# Patient Record
Sex: Female | Born: 1985 | Race: Black or African American | Hispanic: No | Marital: Single | Smoking: Former smoker
Health system: Southern US, Community
[De-identification: ages and names within clinical notes are randomized; demographics above are authoritative.]

## PROBLEM LIST (undated history)

## (undated) ENCOUNTER — Inpatient Hospital Stay (HOSPITAL_COMMUNITY): Payer: Self-pay

## (undated) DIAGNOSIS — M359 Systemic involvement of connective tissue, unspecified: Secondary | ICD-10-CM

## (undated) DIAGNOSIS — R51 Headache: Secondary | ICD-10-CM

## (undated) DIAGNOSIS — E669 Obesity, unspecified: Secondary | ICD-10-CM

## (undated) DIAGNOSIS — N200 Calculus of kidney: Secondary | ICD-10-CM

## (undated) DIAGNOSIS — C9301 Acute monoblastic/monocytic leukemia, in remission: Secondary | ICD-10-CM

## (undated) DIAGNOSIS — K760 Fatty (change of) liver, not elsewhere classified: Secondary | ICD-10-CM

## (undated) DIAGNOSIS — I10 Essential (primary) hypertension: Secondary | ICD-10-CM

## (undated) DIAGNOSIS — T827XXA Infection and inflammatory reaction due to other cardiac and vascular devices, implants and grafts, initial encounter: Secondary | ICD-10-CM

## (undated) DIAGNOSIS — R7881 Bacteremia: Secondary | ICD-10-CM

## (undated) HISTORY — DX: Calculus of kidney: N20.0

## (undated) HISTORY — PX: OTHER SURGICAL HISTORY: SHX169

## (undated) HISTORY — PX: PORTACATH PLACEMENT: SHX2246

---
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
## 2007-01-17 ENCOUNTER — Emergency Department (HOSPITAL_COMMUNITY): Admission: EM | Admit: 2007-01-17 | Discharge: 2007-01-17 | Payer: Self-pay | Admitting: Family Medicine

## 2007-02-18 ENCOUNTER — Emergency Department (HOSPITAL_COMMUNITY): Admission: EM | Admit: 2007-02-18 | Discharge: 2007-02-19 | Payer: Self-pay | Admitting: Emergency Medicine

## 2007-03-03 ENCOUNTER — Ambulatory Visit: Payer: Self-pay | Admitting: Sports Medicine

## 2007-05-20 ENCOUNTER — Telehealth: Payer: Self-pay | Admitting: *Deleted

## 2007-05-20 ENCOUNTER — Ambulatory Visit: Payer: Self-pay | Admitting: Family Medicine

## 2007-05-20 DIAGNOSIS — L2089 Other atopic dermatitis: Secondary | ICD-10-CM

## 2007-05-27 ENCOUNTER — Ambulatory Visit: Payer: Self-pay | Admitting: Family Medicine

## 2007-05-27 ENCOUNTER — Encounter (INDEPENDENT_AMBULATORY_CARE_PROVIDER_SITE_OTHER): Payer: Self-pay | Admitting: *Deleted

## 2007-05-27 ENCOUNTER — Encounter (INDEPENDENT_AMBULATORY_CARE_PROVIDER_SITE_OTHER): Payer: Self-pay | Admitting: Family Medicine

## 2007-05-27 LAB — CONVERTED CEMR LAB: Hgb A1c MFr Bld: 7.4 %

## 2007-05-30 LAB — CONVERTED CEMR LAB
ALT: 28 units/L (ref 0–35)
AST: 20 units/L (ref 0–37)
Albumin: 4.7 g/dL (ref 3.5–5.2)
Alkaline Phosphatase: 69 units/L (ref 39–117)
BUN: 19 mg/dL (ref 6–23)
Calcium: 9.5 mg/dL (ref 8.4–10.5)
Chloride: 105 meq/L (ref 96–112)
HDL: 39 mg/dL (ref >34)
LDL Cholesterol: 49 mg/dL (ref 0–109)
Potassium: 4.5 meq/L (ref 3.5–5.3)
Sodium: 141 meq/L (ref 135–145)
Total Protein: 7 g/dL (ref 6.0–8.3)

## 2007-06-01 ENCOUNTER — Telehealth: Payer: Self-pay | Admitting: *Deleted

## 2007-11-02 ENCOUNTER — Encounter: Payer: Self-pay | Admitting: Family Medicine

## 2007-12-14 ENCOUNTER — Emergency Department (HOSPITAL_COMMUNITY): Admission: EM | Admit: 2007-12-14 | Discharge: 2007-12-14 | Payer: Self-pay | Admitting: Emergency Medicine

## 2007-12-15 ENCOUNTER — Emergency Department (HOSPITAL_COMMUNITY): Admission: EM | Admit: 2007-12-15 | Discharge: 2007-12-16 | Payer: Self-pay | Admitting: Emergency Medicine

## 2008-10-30 ENCOUNTER — Emergency Department (HOSPITAL_COMMUNITY): Admission: EM | Admit: 2008-10-30 | Discharge: 2008-10-30 | Payer: Self-pay | Admitting: Emergency Medicine

## 2008-11-03 ENCOUNTER — Emergency Department (HOSPITAL_COMMUNITY): Admission: EM | Admit: 2008-11-03 | Discharge: 2008-11-03 | Payer: Self-pay | Admitting: Emergency Medicine

## 2009-01-28 ENCOUNTER — Telehealth: Payer: Self-pay | Admitting: Family Medicine

## 2009-01-29 ENCOUNTER — Ambulatory Visit: Payer: Self-pay | Admitting: Family Medicine

## 2009-01-29 ENCOUNTER — Encounter (INDEPENDENT_AMBULATORY_CARE_PROVIDER_SITE_OTHER): Payer: Self-pay | Admitting: Family Medicine

## 2009-01-29 LAB — CONVERTED CEMR LAB
Bilirubin Urine: NEGATIVE
Epithelial cells, urine: >20 /lpf
Glucose, Urine, Semiquant: 500
Protein, U semiquant: 100
Specific Gravity, Urine: 1.015
WBC, UA: >20 cells/hpf
Whiff Test: NEGATIVE
pH: 5.5

## 2009-01-30 LAB — CONVERTED CEMR LAB

## 2009-02-07 ENCOUNTER — Emergency Department (HOSPITAL_COMMUNITY): Admission: EM | Admit: 2009-02-07 | Discharge: 2009-02-07 | Payer: Self-pay | Admitting: Emergency Medicine

## 2009-02-08 ENCOUNTER — Emergency Department (HOSPITAL_COMMUNITY): Admission: EM | Admit: 2009-02-08 | Discharge: 2009-02-09 | Payer: Self-pay | Admitting: Emergency Medicine

## 2009-02-10 ENCOUNTER — Emergency Department (HOSPITAL_COMMUNITY): Admission: EM | Admit: 2009-02-10 | Discharge: 2009-02-10 | Payer: Self-pay | Admitting: Emergency Medicine

## 2009-02-12 ENCOUNTER — Telehealth: Payer: Self-pay | Admitting: *Deleted

## 2009-02-14 ENCOUNTER — Ambulatory Visit: Payer: Self-pay | Admitting: Family Medicine

## 2009-02-14 ENCOUNTER — Encounter: Payer: Self-pay | Admitting: Family Medicine

## 2009-02-14 LAB — CONVERTED CEMR LAB
BUN: 10 mg/dL (ref 6–23)
Chloride: 102 meq/L (ref 96–112)
Hgb A1c MFr Bld: 10.7 %
Potassium: 4.2 meq/L (ref 3.5–5.3)

## 2009-05-09 ENCOUNTER — Emergency Department (HOSPITAL_COMMUNITY): Admission: EM | Admit: 2009-05-09 | Discharge: 2009-05-09 | Payer: Self-pay | Admitting: Emergency Medicine

## 2009-06-14 ENCOUNTER — Ambulatory Visit: Payer: Self-pay | Admitting: Family Medicine

## 2009-06-14 ENCOUNTER — Encounter: Payer: Self-pay | Admitting: Family Medicine

## 2009-06-14 LAB — CONVERTED CEMR LAB: Beta hcg, urine, semiquantitative: NEGATIVE

## 2009-11-22 ENCOUNTER — Ambulatory Visit: Payer: Self-pay | Admitting: Family Medicine

## 2009-11-22 LAB — CONVERTED CEMR LAB: Hgb A1c MFr Bld: 6.4 %

## 2009-11-25 ENCOUNTER — Ambulatory Visit: Payer: Self-pay | Admitting: Family Medicine

## 2009-11-25 ENCOUNTER — Encounter: Payer: Self-pay | Admitting: *Deleted

## 2009-11-25 ENCOUNTER — Encounter: Payer: Self-pay | Admitting: Family Medicine

## 2009-11-25 LAB — CONVERTED CEMR LAB
CO2: 22 meq/L (ref 19–32)
Calcium: 9.1 mg/dL (ref 8.4–10.5)
Chloride: 109 meq/L (ref 96–112)
Creatinine, Ser: 0.66 mg/dL (ref 0.40–1.20)
HDL: 32 mg/dL — ABNORMAL LOW (ref >39)
LDL Cholesterol: 20 mg/dL (ref 0–99)
Sodium: 142 meq/L (ref 135–145)
Total CHOL/HDL Ratio: 3.2
Triglycerides: 249 mg/dL — ABNORMAL HIGH (ref <150)

## 2009-12-04 ENCOUNTER — Telehealth (INDEPENDENT_AMBULATORY_CARE_PROVIDER_SITE_OTHER): Payer: Self-pay | Admitting: Family Medicine

## 2010-01-15 ENCOUNTER — Ambulatory Visit: Payer: Self-pay | Admitting: Family Medicine

## 2010-01-15 ENCOUNTER — Encounter: Payer: Self-pay | Admitting: *Deleted

## 2010-01-15 ENCOUNTER — Encounter: Payer: Self-pay | Admitting: Family Medicine

## 2010-01-29 ENCOUNTER — Encounter: Payer: Self-pay | Admitting: Family Medicine

## 2010-02-02 ENCOUNTER — Emergency Department (HOSPITAL_COMMUNITY): Admission: EM | Admit: 2010-02-02 | Discharge: 2010-02-02 | Payer: Self-pay | Admitting: Emergency Medicine

## 2010-02-25 ENCOUNTER — Ambulatory Visit: Payer: Self-pay | Admitting: Family Medicine

## 2010-02-27 ENCOUNTER — Ambulatory Visit: Payer: Self-pay | Admitting: Family Medicine

## 2010-03-07 ENCOUNTER — Encounter: Payer: Self-pay | Admitting: Family Medicine

## 2010-03-28 ENCOUNTER — Emergency Department (HOSPITAL_COMMUNITY): Admission: EM | Admit: 2010-03-28 | Discharge: 2010-03-28 | Payer: Self-pay | Admitting: Emergency Medicine

## 2010-04-03 ENCOUNTER — Telehealth: Payer: Self-pay | Admitting: Family Medicine

## 2010-04-03 ENCOUNTER — Ambulatory Visit: Payer: Self-pay | Admitting: Family Medicine

## 2010-04-03 DIAGNOSIS — L0291 Cutaneous abscess, unspecified: Secondary | ICD-10-CM

## 2010-04-03 DIAGNOSIS — L0390 Cellulitis, unspecified: Secondary | ICD-10-CM

## 2010-06-17 ENCOUNTER — Telehealth: Payer: Self-pay | Admitting: Family Medicine

## 2010-06-17 ENCOUNTER — Emergency Department (HOSPITAL_COMMUNITY): Admission: EM | Admit: 2010-06-17 | Discharge: 2010-06-17 | Payer: Self-pay | Admitting: Emergency Medicine

## 2010-07-28 ENCOUNTER — Emergency Department (HOSPITAL_COMMUNITY)
Admission: EM | Admit: 2010-07-28 | Discharge: 2010-07-28 | Payer: Self-pay | Source: Home / Self Care | Admitting: Emergency Medicine

## 2010-10-21 NOTE — Progress Notes (Signed)
Summary: wi request  Phone Note Call from Patient Call back at Home Phone [phone]   Reason for Call: Talk to Nurse Summary of Call: req wi appt for abcess Initial call taken by: Knox Royalty,  April 03, 2010 9:48 AM  Follow-up for Phone Call        on R buttocks x 3-4 days. went to ED monday & they could not lance it "because it did not have a soft spot". states it does now. workin today with Dr. Denyse Amass. pcp not available Follow-up by: Golden Circle RN,  April 03, 2010 9:52 AM

## 2010-10-21 NOTE — Assessment & Plan Note (Signed)
Summary: had gone to [facility]   Vital Signs:  Patient profile:   25 year old female Weight:      270 pounds Temp:     100.2 degrees F oral Pulse rate:   91 / minute Pulse rhythm:   regular BP sitting:   119 / 85  (left arm) Cuff size:   large  Vitals Entered By: Loralee Pacas CMA (January 15, 2010 2:59 PM) CC: boil Comments boil on right buttock ecezma on right arm   Primary Care Provider:  Lequita Asal  MD  CC:  boil.  History of Present Illness: lesion on R buttock. noted last night. no drainage. +TTP. no fever, chills, n/v. no prior history.   Allergies: 1)  ! Hydrocodone  Procedure Note  Incision & Drainage: The patient complains of pain, redness, irritation, inflammation, tenderness, and fever but denies discharge. Date of onset: 01/14/2010 Onset of lesion: 12-24 hrs. Indication: infected lesion Consent signed: yes  Procedure # 1: I & D    Size (in cm): 3.0 x 3.0    Location: buttocks-right    Comment: area cleaned and prepped in sterile fashion and anesthetized with lidocaine with epi. incision made with scalpel, but no drainage appreciated. area probed with hemostat but no loculations appreciated. minimal bleeding. patient tolerated procedure well.     Instrument used: #11 blade    Anesthesia: 1% lidocaine w/epinephrine  Physical Exam  General:  obese female, NAD. vitals reviewed.  Skin:   ~3x3 cm area of induration with ?central fluctuance of right buttock. +warmth, TTP, erythema.    Impression & Recommendations:  Problem # 1:  CELLULITIS AND ABSCESS OF BUTTOCK (ICD-682.5) Assessment New  I&D attempted, but no drainage expressed. given findings, low grade fever, and evidence of cellulitis will treat with antibiotics, warm compresses.   Her updated medication list for this problem includes:    Keflex 500 Mg Caps (Cephalexin) ..... One tab by mouth two times a day x7 days.  Orders: [facility]- Est  Level 4 (62130) I&D Abcess, simple- [facility]  (10060) Prescriptions: KEFLEX 500 MG CAPS (CEPHALEXIN) one tab by mouth two times a day x7 days.  #14 x 0   Entered and Authorized by:   Lequita Asal  MD   Signed by:   Lequita Asal  MD on 01/15/2010   Method used:   Electronically to        [facility] [phone]* (retail)       [address]       [place], [place]  [address]       Ph: [phone]       Fax: [phone]   RxID:   [id number]

## 2010-10-21 NOTE — Assessment & Plan Note (Signed)
Summary: f/u dm & tb test,df   Vital Signs:  Patient profile:   25 year old female Weight:      272.5 pounds BMI:     48.45 Temp:     98.7 degrees F oral Pulse rate:   58 / minute Pulse rhythm:   regular BP sitting:   141 / 88  (right arm)  Vitals Entered By: Loralee Pacas CMA (November 22, 2009 9:24 AM)  Nutrition Counseling: Patient's BMI is greater than 25 and therefore counseled on weight management options.  Primary Care Provider:  Lequita Asal  MD  CC:  f/u DM and HTN.  History of Present Illness: 25 y/o female here for f/u DM, HTN  DM- off meds x6 months. endorses polyuria, polydipsia, occasional blurred vision. weight gain of 13 lbs since last visit. little exercise. not testing CBGs  HTN- off meds x6 months. denies chest pain, SOB, headaches. occasional blurred vision  "mass" on chest- present 1 year. occasionally tender. not growing larger. no drainage. concerned 2/2 extensive family hx of various cancers.   Current Medications (verified): 1)  Metformin Hcl 850 Mg Tabs (Metformin Hcl) .... One Tab By Mouth Bid 2)  Lisinopril 20 Mg Tabs (Lisinopril) .Marland Kitchen.. 1 Tablet By Mouth Daily 3)  Triamcinolone Acetonide 0.1 % Oint (Triamcinolone Acetonide) .... Apply To Affected Areas Two Times A Day As Needed. Do Not Use On Face. Dispense Quantity Sufficient For 1 Month 4)  Prodigy Blood Glucose Monitor W/device Kit (Blood Glucose Monitoring Suppl) .... Use As Directed 5)  Prodigy Twist Top Lancets 28g  Misc (Lancets) .... Use As Directed. Disp 1 Month Supply 6)  Prodigy Blood Glucose Test  Strp (Glucose Blood) .... Use As Directed. Disp 1 Month Supply  Allergies (verified): 1)  ! Hydrocodone  Physical Exam  General:  obese hirsute female, NAD, vitals reviewed.  Mouth:  MMM Neck:  acanthosis nigricans Chest Wall:  small 5x5 mm cyst R of midline. nontender. no fluctuance, erythema, drainage, induration. Lungs:  Normal respiratory effort, chest expands symmetrically. Lungs  are clear to auscultation, no crackles or wheezes. Heart:  Normal rate and regular rhythm. S1 and S2 normal without gallop, murmur, click, rub or other extra sounds. Abdomen:  obese, NT, ND, +BS Extremities:  no peripheral edema of BLE  Diabetes Management Exam:    Foot Exam (with socks and/or shoes not present):       Sensory-Pinprick/Light touch:          Left medial foot (L-4): normal          Left dorsal foot (L-5): normal          Left lateral foot (S-1): normal          Right medial foot (L-4): normal          Right dorsal foot (L-5): normal          Right lateral foot (S-1): normal       Sensory-Monofilament:          Left foot: normal          Right foot: normal       Inspection:          Left foot: normal          Right foot: normal       Nails:          Left foot: normal          Right foot: normal   Impression & Recommendations:  Problem #  1:  DIABETES-TYPE 2 (ICD-250.00) Assessment Improved at goal. refills given. will refer for eye appt.   Her updated medication list for this problem includes:    Metformin Hcl 850 Mg Tabs (Metformin hcl) ..... One tab by mouth bid    Lisinopril 20 Mg Tabs (Lisinopril) .Marland Kitchen... 1 tablet by mouth daily  Orders: A1C-[facility] ([address]109) Ophthalmology Referral (Ophthalmology) [facility]- Est  Level 4 (99214)Future Orders: Basic Met-[facility] ([id number]) ... 11/28/2010 Lipid-[facility] ([id number]) ... 11/21/2010  Problem # 2:  HYPERTENSION, BENIGN ESSENTIAL (ICD-401.1) Assessment: Deteriorated refill lisinopril.   Her updated medication list for this problem includes:    Lisinopril 20 Mg Tabs (Lisinopril) .Marland Kitchen... 1 tablet by mouth daily  Orders: [facility]- Est  Level 4 (13086)  Problem # 3:  SEBACEOUS CYST, BREAST (ICD-610.8) Assessment: New warm compresses as needed. no need for excision or I&D.   Orders: [facility]- Est  Level 4 (57846)  Other Orders: TB Skin Test [id number]) Admin 1st Vaccine (28413)  Patient Instructions: 1)  Your A1C test (for your  diabetes) is 6.4. That is GREAT!!! The goal is less than 7 2)  Follow up in 3 months Prescriptions: PRODIGY BLOOD GLUCOSE TEST  STRP (GLUCOSE BLOOD) use as directed. disp 1 month supply  #1 x 3   Entered and Authorized by:   Lequita Asal  MD   Signed by:   Lequita Asal  MD on 11/22/2009   Method used:   Electronically to        [facility] [phone]* (retail)       [address]       [place], [place]  [address]       Ph: [phone]       Fax: [phone]   RxID:   [id number] PRODIGY TWIST TOP LANCETS 28G  MISC (LANCETS) use as directed. disp 1 month supply  #1 x 3   Entered and Authorized by:   Lequita Asal  MD   Signed by:   Lequita Asal  MD on 11/22/2009   Method used:   Electronically to        [facility] [phone]* (retail)       [address]       [place], [place]  [address]       Ph: [phone]       Fax: [phone]   RxID:   [id number] PRODIGY BLOOD GLUCOSE MONITOR W/DEVICE KIT (BLOOD GLUCOSE MONITORING SUPPL) use as directed  #1 x 0   Entered and Authorized by:   Lequita Asal  MD   Signed by:   Lequita Asal  MD on 11/22/2009   Method used:   Electronically to        [facility] [phone]* (retail)       [address]       [place], [place]  [address]       Ph: [phone]       Fax: [phone]   RxID:   [id number] LISINOPRIL 20 MG TABS (LISINOPRIL) 1 tablet by mouth daily  #30 x 3   Entered and Authorized by:   Lequita Asal  MD   Signed by:   Lequita Asal  MD on 11/22/2009   Method used:   [facility]ron[facility]        [facility] [address] [phone]* (retail)       [address]       [place], [place]  [address]  Ph: [phone] or [phone]       Fax: [phone]   RxID:   [id number] METFORMIN HCL 850 MG TABS (METFORMIN HCL) one tab by mouth BID  #60 x 3   Entered and Authorized by:   Lequita Asal  MD   Signed by:   Lequita Asal  MD on 11/22/2009   Method  used:   [facility]        [facility] [address] [phone]* (retail)       [address]       [place], [place]  [address]       Ph: [phone] or [phone]       Fax: [phone]   RxID:   [id number]    Immunizations Administered:  PPD Skin Test:    Vaccine Type: PPD    Site: right forearm    Mfr: Sanofi Pasteur    Dose: 0.1 ml    Route: ID    Given by: Loralee Pacas CMA    Exp. Date: 02/16/2012    Lot #: [id number]  Laboratory Results   Blood Tests   Date/Time Received: November 22, 2009 9:23 AM  Date/Time Reported: November 22, 2009 9:48 AM   HGBA1C: 6.4%   (Normal Range: Non-Diabetic - 3-6%   Control Diabetic - 6-8%)  Comments: ...........test performed by...........Marland KitchenTerese Door, CMA  Date/Time Received:      Prevention & Chronic Care Immunizations   Influenza vaccine: Not documented    Tetanus booster: Not documented    Pneumococcal vaccine: Not documented  Other Screening   Pap smear: Not documented   Smoking status: never  (02/14/2009)  Diabetes Mellitus   HgbA1C: 6.4  (11/22/2009)    Eye exam: Not documented    Foot exam: yes  (11/22/2009)   High risk foot: Not documented   Foot care education: Not documented    Urine microalbumin/creatinine ratio: Not documented    Diabetes flowsheet reviewed?: Yes   Progress toward A1C goal: At goal  Hypertension   Last Blood Pressure: 141 / 88  (11/22/2009)   Serum creatinine: 0.83  (02/14/2009)   Serum potassium 4.2  (02/14/2009)    Hypertension flowsheet reviewed?: Yes   Progress toward BP goal: Deteriorated  Self-Management Support :   Personal Goals (by the next clinic visit) :     Personal A1C goal: 7  (11/22/2009)     Personal blood pressure goal: 130/80  (11/22/2009)     Personal LDL goal: 100  (11/22/2009)    Patient will work on the following items until the next clinic visit to reach self-care goals:     Medications and monitoring: take my medicines  every day, check my blood sugar  (11/22/2009)     Eating: eat baked foods instead of fried foods, eat fruit for snacks and desserts  (11/22/2009)     Activity: take a 30 minute walk every day  (11/22/2009)    Diabetes self-management support: Not documented    Diabetes self-management support not done because: Good outcomes  (11/22/2009)    Hypertension self-management support: Written self-care plan, Education handout  (11/22/2009)   Hypertension self-care plan printed.   Hypertension education handout printed

## 2010-10-21 NOTE — Miscellaneous (Signed)
Summary: [facility] opthamology appt  Clinical Lists Changes she did not keep her eye dr appt 01/24/10.Golden Circle RN  Jan 29, 2010 2:30 PM

## 2010-10-21 NOTE — Assessment & Plan Note (Signed)
Summary: read tb/eo  Nurse Visit   Allergies: 1)  ! Hydrocodone  PPD Results    Date of reading: 11/25/2009    Results: 0 mm    Interpretation: negative  Orders Added: 1)  No Charge Patient Arrived (NCPA0) [NCPA0]

## 2010-10-21 NOTE — Assessment & Plan Note (Signed)
Summary: abcess per pt/[place]   Vital Signs:  Patient profile:   25 year old female Weight:      272 pounds BMI:     48.36 Temp:     98.7 degrees F oral BP sitting:   114 / 74  (left arm) Cuff size:   large  Vitals Entered By: Jimmy Footman, CMA (April 03, 2010 3:34 PM) CC: boil on rt bottock x 4days.  Is Patient Diabetic? Yes Comments Started draining last night. Seen @ [facility] ed 7/11   Primary Care Provider:  Edd Arbour  CC:  boil on rt bottock x 4days. Marland Kitchen  History of Present Illness: Melissa Washington presents to clinic today for I&D of abscess of right bottock. Melissa Washington presented to the ED 2 days ago for a painful and slightly draining area on her right buttock. At that time the ED noted that the area was too indurated and not fluctuant enough to drain. She continued to apply a compress. She comes to clinic today because she feel like it is now "soft" in the middle and can be I&Ded.  She denies any fever, or chills, and feels well. She notes that the area is painful but that it feels like the other abscess she has had in the past.   Habits & Providers  Alcohol-Tobacco-Diet     Tobacco Status: never  Current Problems (verified): 1)  Abscess, Skin  (ICD-682.9) 2)  Eczema, Atopic  (ICD-691.8) 3)  Hypertension, Benign Essential  (ICD-401.1) 4)  Diabetes-type 2  (ICD-250.00)  Medications Prior to Update: 1)  Metformin Hcl 850 Mg Tabs (Metformin Hcl) .... One Tab By Mouth Bid 2)  Lisinopril 20 Mg Tabs (Lisinopril) .Marland Kitchen.. 1 Tablet By Mouth Daily 3)  Triamcinolone Acetonide 0.1 % Oint (Triamcinolone Acetonide) .... Apply To Affected Areas Two Times A Day As Needed. Do Not Use On Face. Dispense Quantity Sufficient For 1 Month 4)  Prodigy Blood Glucose Monitor W/device Kit (Blood Glucose Monitoring Suppl) .... Use As Directed 5)  Prodigy Twist Top Lancets 28g  Misc (Lancets) .... Use As Directed. Disp 1 Month Supply 6)  Prodigy Blood Glucose Test  Strp (Glucose Blood) .... Use As Directed.  Disp 1 Month Supply  Allergies (verified): 1)  ! Hydrocodone  Past History:  Past Medical History: Last updated: 12/30/2006 GDM (2005 ) First time  DM detected, Sickle cell trait Diabetes mellitus, type II Hypertension  Review of Systems  The patient denies fever, chest pain, syncope, dyspnea on exertion, abdominal pain, severe indigestion/heartburn, muscle weakness, and suspicious skin lesions.    Physical Exam  General:  VS noted. Obese woman in NAD Heart:  RRR no MRG Skin:  2-2 cm hyperpigmented area on the upper right buttock with surrounding 4cm of induration. Small soft fluctuant area in the center of the lesion.  Mildly TTP no surround erythemia.   Not near anus and no appearant communication with GI tract. See procedure note Additional Exam:  I&D abscess: Discussed the risk and benifits with patient. Discussed that area is still somewhat indurated but is drainabel. Pt agreed to proceed with I&D. Area cleaned with alcohol. 6ml of 2% lidocaine with epinepherine was injected in the wheel pattern around the area. This resulted in good anasthesia. The fluctuant area in the middle was then lanced with a 7mm incision with scaple.  Some white material was expressed. Hemostats where then used to remove further necrotic tissue. The surround tissue was indurated and no further material was expressed. Therefore the procedure  was stoped and the wound was packed with 6cm of packing material. A dressing was then applied to the wound. Pt tolerated the procedure well.    Impression & Recommendations:  Problem # 1:  ABSCESS, SKIN (ICD-682.9)  I&D abcess. Not fully developed however pt agreed to proceed with understanding that she may need a repeat I&D. Small abcess was drained and packed.  Plan to follow up. If fever, or worsening pain or redness or other red flags pt will return to clinic for re inspection and possibly ABX. Pt voices understanding.  Orders: [facility]- Est Level  3  ([address]109) I&D Abcess, simple- [facility] (10060)  Complete Medication List: 1)  Metformin Hcl 850 Mg Tabs (Metformin hcl) .... One tab by mouth bid 2)  Lisinopril 20 Mg Tabs (Lisinopril) .Marland Kitchen.. 1 tablet by mouth daily 3)  Triamcinolone Acetonide 0.1 % Oint (Triamcinolone acetonide) .... Apply to affected areas two times a day as needed. do not use on face. dispense quantity sufficient for 1 month 4)  Prodigy Blood Glucose Monitor W/device Kit (Blood glucose monitoring suppl) .... Use as directed 5)  Prodigy Twist Top Lancets 28g Misc (Lancets) .... Use as directed. disp 1 month supply 6)  Prodigy Blood Glucose Test Strp (Glucose blood) .... Use as directed. disp 1 month supply  Patient Instructions: 1)  Thank you for seeing me today. 2)  The wound may drain, that is normal.  3)  If the packing falls out that is OK too.  4)  If the area starts getting very red and painful let us know. 5)  If you get a fever or chills please call us.

## 2010-10-21 NOTE — Assessment & Plan Note (Signed)
Summary: read ppd/kh  Nurse Visit   Allergies: 1)  ! Hydrocodone  PPD Results    Date of reading: 02/27/2010    Results: 0 mm    Interpretation: negative  Orders Added: 1)  No Charge Patient Arrived (NCPA0) [NCPA0]

## 2010-10-21 NOTE — Progress Notes (Signed)
Summary: Meds  Phone Note Call from Patient Call back at [phone]   Reason for Call: Talk to Nurse Summary of Call: needs to speak to RN re: meds not received at pharmacy Initial call taken by: Knox Royalty,  December 04, 2009 3:45 PM    Prescriptions: LISINOPRIL 20 MG TABS (LISINOPRIL) 1 tablet by mouth daily  #30 x 3   Entered by:   Gladstone Pih   Authorized by:   Lequita Asal  MD   Signed by:   Gladstone Pih on 12/04/2009   Method used:   Electronically to        [facility] [phone]* (retail)       [address]       [place], [place]  [address]       Ph: [phone]       Fax: [phone]   RxID:   [id number] METFORMIN HCL 850 MG TABS (METFORMIN HCL) one tab by mouth BID  #60 x 3   Entered by:   Gladstone Pih   Authorized by:   Lequita Asal  MD   Signed by:   Gladstone Pih on 12/04/2009   Method used:   Electronically to        [facility] [phone]* (retail)       [address]       [place], [place]  [address]       Ph: [phone]       Fax: [phone]   RxID:   [id number]   Appended Document: Meds Spoke with pt not all her Rx where at the pharm, Rx where sent to wrong Pharm ever[facility] have [facility], resent Rx to correct Pharm

## 2010-10-21 NOTE — Miscellaneous (Signed)
Summary: she had tried to go to [facility]  Clinical Lists Changes [facility] called [place]. she showed up there. I put her in with her pcp for now.Golden Circle RN  January 15, 2010 2:41 PM

## 2010-10-21 NOTE — Letter (Signed)
Summary: Out of Work  [facility]  [address]   [place], [place] [address]   Phone: [phone]  Fax: [phone]    January 15, 2010   Employee:  CHARIE PINKUS    To Whom It May Concern:   For Medical reasons, please excuse the above named employee from work for the following dates:  Start:    End:    If you need additional information, please feel free to contact our office.         Sincerely,    Loralee Pacas CMA

## 2010-10-21 NOTE — Letter (Signed)
Summary: Generic Letter  [facility]  [address]   [place], [place] [address]   Phone: [phone]  Fax: [phone]    11/25/2009  Melissa Washington [address] [place], [place]  [address]  Dear [name],   I have tried to reach you by phone concerning the referral that has been made for you to have a Diabetic eye exam on May 6,2011 @ 1230pm with [facility] [address], [place] [address] [phone].  If you cannot keep this appointment please call thier office 24hrs before your appointment to reschedule.  Also will you please call our office and update your phone number.  Thank you.        Sincerely,   Loralee Pacas CMA

## 2010-10-21 NOTE — Assessment & Plan Note (Signed)
Summary: tb test/eo  Nurse Visit  patient in to receive PPD. states her employer is requiring another PPD even though she has one in March 2011. Theresia Lo RN  February 25, 2010 9:59 AM   Allergies: 1)  ! Hydrocodone  Immunizations Administered:  PPD Skin Test:    Vaccine Type: PPD    Site: left forearm    Mfr: Sanofi Pasteur    Dose: 0.1 ml    Route: ID    Given by: Theresia Lo RN    Exp. Date: 07/04/2011    Lot #: [id number]  Orders Added: 1)  TB Skin Test [86580] 2)  Admin 1st Vaccine [phone]

## 2010-10-21 NOTE — Miscellaneous (Signed)
Summary: Consent for drainage of R buttock  Consent for drainage of R buttock   Imported By: Clydell Hakim 01/16/2010 15:38:18  _____________________________________________________________________  External Attachment:    Type:   Image     Comment:   External Document

## 2010-10-21 NOTE — Miscellaneous (Signed)
  Clinical Lists Changes  Problems: Removed problem of SCREENING EXAMINATION FOR PULMONARY TUBERCULOSIS (ICD-V74.1) Removed problem of CELLULITIS AND ABSCESS OF BUTTOCK (ICD-682.5) Removed problem of INSERTION OF INTRAUTERINE CONTRACEPTIVE DEVICE (ICD-V25.1) Removed problem of INTRAUTERINE CONTRACEPTIVE DEVICE (ICD-V25.1) Removed problem of CONTRACEPTIVE MANAGEMENT (ICD-V25.09)

## 2010-10-21 NOTE — Progress Notes (Signed)
  Phone Note Other Incoming   Caller: Dr. Dione Booze  Summary of Call: Near syncopal event today, witnessed by Husband, initially concerned for psychiatric involvement by initial EDP but follow-up EDP states not the case, otherwise stable for D/C  history of HTN,DM, resting HR in 50's at [facility], otherwise vitals normal, CBG 166 , CBC normal per report, ISTAT normal, upreg neg, UDS neg EKG- non specific changes Not taking meds as prescribed Concerned pt needs and event monitor also needs to f/u on meds Will need appt this week Initial call taken by: Milinda Antis MD,  June 17, 2010 7:10 PM

## 2010-10-21 NOTE — Letter (Signed)
Summary: Out of Work  [facility]  [address]   [place], [place] [address]   Phone: [phone]  Fax: [phone]    January 15, 2010   Employee:  Melissa Washington    To Whom It May Concern:   For Medical reasons, please excuse the above named employee from work for the following dates:  Start:   January 15, 2010  End:   January 15, 2010  If you need additional information, please feel free to contact our office.         Sincerely,    Loralee Pacas CMA

## 2010-10-30 ENCOUNTER — Inpatient Hospital Stay (INDEPENDENT_AMBULATORY_CARE_PROVIDER_SITE_OTHER)
Admission: RE | Admit: 2010-10-30 | Discharge: 2010-10-30 | Disposition: A | Discharge status: Home / Self Care | Payer: Self-pay | Source: Ambulatory Visit | Attending: Family Medicine | Admitting: Family Medicine

## 2010-10-30 DIAGNOSIS — R112 Nausea with vomiting, unspecified: Secondary | ICD-10-CM

## 2010-10-30 LAB — POCT URINALYSIS DIPSTICK
Bilirubin Urine: NEGATIVE
Hgb urine dipstick: NEGATIVE
Ketones, ur: NEGATIVE mg/dL
Nitrite: NEGATIVE
Protein, ur: NEGATIVE mg/dL
Specific Gravity, Urine: 1.015 (ref 1.005–1.030)
Urine Glucose, Fasting: NEGATIVE mg/dL
Urobilinogen, UA: 1 mg/dL (ref 0.0–1.0)
pH: 5.5 (ref 5.0–8.0)

## 2010-12-04 ENCOUNTER — Ambulatory Visit (INDEPENDENT_AMBULATORY_CARE_PROVIDER_SITE_OTHER): Payer: Self-pay | Admitting: Family Medicine

## 2010-12-04 ENCOUNTER — Encounter: Payer: Self-pay | Admitting: Family Medicine

## 2010-12-04 DIAGNOSIS — T8339XA Other mechanical complication of intrauterine contraceptive device, initial encounter: Secondary | ICD-10-CM

## 2010-12-04 DIAGNOSIS — N6009 Solitary cyst of unspecified breast: Secondary | ICD-10-CM | POA: Insufficient documentation

## 2010-12-04 LAB — DIFFERENTIAL
Basophils Absolute: 0 10*3/uL (ref 0.0–0.1)
Basophils Relative: 1 % (ref 0–1)
Monocytes Absolute: 0.3 10*3/uL (ref 0.1–1.0)
Neutro Abs: 5.4 10*3/uL (ref 1.7–7.7)

## 2010-12-04 LAB — ETHANOL: Alcohol, Ethyl (B): <5 mg/dL (ref 0–10)

## 2010-12-04 LAB — CBC
HCT: 34.6 % — ABNORMAL LOW (ref 36.0–46.0)
MCHC: 35.5 g/dL (ref 30.0–36.0)
Platelets: 218 10*3/uL (ref 150–400)
RDW: 13.3 % (ref 11.5–15.5)
WBC: 7.4 10*3/uL (ref 4.0–10.5)

## 2010-12-04 LAB — BASIC METABOLIC PANEL
BUN: 15 mg/dL (ref 6–23)
Calcium: 9.2 mg/dL (ref 8.4–10.5)
Creatinine, Ser: 0.8 mg/dL (ref 0.4–1.2)
GFR calc non Af Amer: >60 mL/min (ref >60)
Glucose, Bld: 166 mg/dL — ABNORMAL HIGH (ref 70–99)
Potassium: 3.8 mEq/L (ref 3.5–5.1)

## 2010-12-04 LAB — POCT PREGNANCY, URINE: Preg Test, Ur: NEGATIVE

## 2010-12-04 LAB — RAPID URINE DRUG SCREEN, HOSP PERFORMED
Amphetamines: NOT DETECTED
Benzodiazepines: NOT DETECTED
Cocaine: NOT DETECTED
Tetrahydrocannabinol: NOT DETECTED

## 2010-12-04 NOTE — Patient Instructions (Signed)
Follow up with GYN Follow up with [facility]

## 2010-12-04 NOTE — Progress Notes (Signed)
  Subjective:    Patient ID: Melissa Washington, female    DOB: 09/05/1986, 25 y.o.   MRN: [id number]  HPI Breast Mass: Pt. Has a two year history of breast lump in the right upper inner quadrant. She has tenderness on palpation. No drainage. No fevers.  The mass is 1x1 cm, deep to the subcutaneous layer. No LA.  IUD Removal: Pt. Has had her Mirena for one year. She is here for removal because her husband and her desire to have a baby.  On speculum exam there was no evidence of IUD strings. She has no strings visible on endocervical speculum. She has a positive IUD on ultrasound. She is sexually active. No sx/symp of STD. She has no bleeding.   Review of Systems  Constitutional: Negative for fever, chills, diaphoresis, fatigue and unexpected weight change.  Genitourinary: Negative for hematuria, vaginal bleeding, vaginal discharge, vaginal pain, menstrual problem, pelvic pain and dyspareunia.       Objective:   Physical Exam  Constitutional: She appears well-developed and well-nourished.  HENT:  Head: Normocephalic and atraumatic.  Abdominal: Soft. She exhibits no distension. There is no tenderness.  Genitourinary: Vagina normal. There is breast tenderness. No breast swelling, discharge or bleeding. There is no rash, tenderness, lesion or injury on the right labia. There is no rash, tenderness, lesion or injury on the left labia. No vaginal discharge found.  breast cyst as per HPI        Assessment & Plan:  Pt. Is a 25 y/o aaf here for eval. Of breast cyst and IUD removal. 1. IUD removal attempted. Strings appear to have migrated into the uterus. Visualized IUD on U/S. Will refer her to GYN for removal of IUD complicated by migration 2. Breast cyst. Appears benign, does not have characteristics of abscess and is fairly deep. I will refer to Gen. surg for further evaluation.

## 2010-12-08 ENCOUNTER — Telehealth: Payer: Self-pay | Admitting: *Deleted

## 2010-12-08 LAB — GLUCOSE, CAPILLARY: Glucose-Capillary: 115 mg/dL — ABNORMAL HIGH (ref 70–99)

## 2010-12-08 NOTE — Telephone Encounter (Signed)
Melissa Washington, Spoke with patient and we are going to hold off on the Surgeon referral for the cyst due to her not having insurance. She will let us know when she gets it so we can follow up with the referral. She will have to pay out of pocket if wanting this, which she does not have the money for this due to no employment. -[place] Dec

## 2010-12-09 ENCOUNTER — Telehealth: Payer: Self-pay | Admitting: *Deleted

## 2010-12-09 NOTE — Telephone Encounter (Signed)
Spoke with patient and informed her of her GYN appoint @ [facility] clinic for 12/31/2010 @3  pm. Pt. Informed to arrive 10 min early, bring picture ID and may have to pay up front some because of no insurance.

## 2010-12-13 ENCOUNTER — Inpatient Hospital Stay (HOSPITAL_COMMUNITY): Payer: Self-pay

## 2010-12-13 ENCOUNTER — Inpatient Hospital Stay (HOSPITAL_COMMUNITY)
Admission: AD | Admit: 2010-12-13 | Discharge: 2010-12-13 | Disposition: A | Discharge status: Home / Self Care | Payer: Self-pay | Source: Ambulatory Visit | Attending: Obstetrics & Gynecology | Admitting: Obstetrics & Gynecology

## 2010-12-13 DIAGNOSIS — A499 Bacterial infection, unspecified: Secondary | ICD-10-CM | POA: Insufficient documentation

## 2010-12-13 DIAGNOSIS — R1032 Left lower quadrant pain: Secondary | ICD-10-CM | POA: Insufficient documentation

## 2010-12-13 DIAGNOSIS — N760 Acute vaginitis: Secondary | ICD-10-CM | POA: Insufficient documentation

## 2010-12-13 DIAGNOSIS — B9689 Other specified bacterial agents as the cause of diseases classified elsewhere: Secondary | ICD-10-CM | POA: Insufficient documentation

## 2010-12-13 LAB — URINALYSIS, ROUTINE W REFLEX MICROSCOPIC
Glucose, UA: NEGATIVE mg/dL
Hgb urine dipstick: NEGATIVE
Ketones, ur: NEGATIVE mg/dL
Protein, ur: NEGATIVE mg/dL
Urobilinogen, UA: 0.2 mg/dL (ref 0.0–1.0)

## 2010-12-13 LAB — WET PREP, GENITAL

## 2010-12-16 LAB — GC/CHLAMYDIA PROBE AMP, GENITAL: GC Probe Amp, Genital: NEGATIVE

## 2010-12-30 LAB — POCT PREGNANCY, URINE: Preg Test, Ur: NEGATIVE

## 2010-12-30 LAB — URINALYSIS, ROUTINE W REFLEX MICROSCOPIC
Bilirubin Urine: NEGATIVE
Ketones, ur: 40 mg/dL — AB
Protein, ur: 30 mg/dL — AB
Urobilinogen, UA: 0.2 mg/dL (ref 0.0–1.0)

## 2010-12-30 LAB — WET PREP, GENITAL: Trich, Wet Prep: NONE SEEN

## 2010-12-30 LAB — POCT URINALYSIS DIP (DEVICE)
Glucose, UA: 100 mg/dL — AB
Nitrite: NEGATIVE
Urobilinogen, UA: 0.2 mg/dL (ref 0.0–1.0)

## 2010-12-30 LAB — KETONES, QUALITATIVE: Acetone, Bld: NEGATIVE

## 2010-12-30 LAB — URINE CULTURE: Colony Count: 35000

## 2010-12-30 LAB — DIFFERENTIAL
Basophils Relative: 0 % (ref 0–1)
Eosinophils Absolute: 0.3 10*3/uL (ref 0.0–0.7)
Monocytes Relative: 3 % (ref 3–12)
Neutrophils Relative %: 81 % — ABNORMAL HIGH (ref 43–77)

## 2010-12-30 LAB — COMPREHENSIVE METABOLIC PANEL
ALT: 44 U/L — ABNORMAL HIGH (ref 0–35)
Alkaline Phosphatase: 81 U/L (ref 39–117)
CO2: 24 mEq/L (ref 19–32)
Glucose, Bld: 314 mg/dL — ABNORMAL HIGH (ref 70–99)
Potassium: 4.1 mEq/L (ref 3.5–5.1)
Sodium: 136 mEq/L (ref 135–145)
Total Protein: 7.8 g/dL (ref 6.0–8.3)

## 2010-12-30 LAB — CBC
Hemoglobin: 12.3 g/dL (ref 12.0–15.0)
RBC: 4.38 MIL/uL (ref 3.87–5.11)
RDW: 13.4 % (ref 11.5–15.5)
WBC: 7.5 10*3/uL (ref 4.0–10.5)

## 2010-12-30 LAB — URINE MICROSCOPIC-ADD ON

## 2010-12-30 LAB — GLUCOSE, CAPILLARY

## 2010-12-31 ENCOUNTER — Encounter: Payer: Self-pay | Admitting: Obstetrics and Gynecology

## 2010-12-31 DIAGNOSIS — Z30432 Encounter for removal of intrauterine contraceptive device: Secondary | ICD-10-CM

## 2011-01-01 NOTE — Progress Notes (Signed)
NAME:  Melissa Washington, Melissa Washington NO.:  [id number]  MEDICAL RECORD NO.:  [id number]           PATIENT TYPE:  A  LOCATION:  [facility]                   FACILITY:  [facility]  PHYSICIAN:  Scheryl Darter, MD       DATE OF BIRTH:  26-May-1986  DATE OF SERVICE:                                 CLINIC NOTE  The patient is referred from [facility] for removal of an IUD.  The patient has a Mirena that has been in place for well over a year.  The patient says she has some left lower quadrant pain, which she thinks is due to the IUD.  She says she would like to switch to oral contraceptives.  The patient is a 25 year old black female gravida 1, para 1, currently amenorrheic with Mirena in place.  She has type 2 diabetes and hypertension.  MEDICATIONS:  Metformin 1000 mg p.o. daily and lisinopril 50 mg p.o. b.i.d.  She has no known drug allergies.  No latex allergy.  No past surgeries.  FAMILY HISTORY:  Diabetes and hypertension.  SOCIAL HISTORY:  The patient is single.  She lives with her daughter. She denies alcohol, tobacco, or drug use.  REVIEW OF SYSTEMS:  Mild left lower quadrant pain.  No abnormal discharge or bleeding or fever.  PHYSICAL EXAMINATION:  GENERAL:  The patient is no acute distress. VITAL SIGNS:  Weight 283 pounds, height is 5 feet 2 inches, blood pressure 123/72, temperature 97.9. PELVIC:  External genitalia, vagina, and cervix appeared normal.  No string was visible at the os.  Uterine dressing forceps were used to grasp the string through the canal and the IUD was removed intact without difficulty. I showed the IUD to the patient after removal.  She tolerated this well.  Gave her prescription for try Sprintec.  She should start this soon as she is currently not covered for contraception.  I have recommended she return to [facility] for her blood pressure checks and to have check after initiation of oral  contraceptives.     Scheryl Darter, MD    JA/MEDQ  D:  12/31/2010  T:  01/01/2011  Job:  [id number]  cc:   [facility]  Edd Arbour, MD

## 2011-01-06 LAB — URINALYSIS, ROUTINE W REFLEX MICROSCOPIC
Bilirubin Urine: NEGATIVE
Glucose, UA: 100 mg/dL — AB
Specific Gravity, Urine: 1.012 (ref 1.005–1.030)
Urobilinogen, UA: 0.2 mg/dL (ref 0.0–1.0)
pH: 5.5 (ref 5.0–8.0)

## 2011-01-06 LAB — GC/CHLAMYDIA PROBE AMP, GENITAL
Chlamydia, DNA Probe: NEGATIVE
GC Probe Amp, Genital: NEGATIVE

## 2011-01-06 LAB — BASIC METABOLIC PANEL
BUN: 11 mg/dL (ref 6–23)
Calcium: 9.5 mg/dL (ref 8.4–10.5)
Creatinine, Ser: 0.63 mg/dL (ref 0.4–1.2)
GFR calc non Af Amer: >60 mL/min (ref >60)
Glucose, Bld: 217 mg/dL — ABNORMAL HIGH (ref 70–99)

## 2011-01-06 LAB — URINE MICROSCOPIC-ADD ON

## 2011-01-06 LAB — DIFFERENTIAL
Basophils Absolute: 0 10*3/uL (ref 0.0–0.1)
Eosinophils Relative: 2 % (ref 0–5)
Lymphocytes Relative: 32 % (ref 12–46)
Lymphs Abs: 1.7 10*3/uL (ref 0.7–4.0)
Neutrophils Relative %: 60 % (ref 43–77)

## 2011-01-06 LAB — WET PREP, GENITAL: Clue Cells Wet Prep HPF POC: NONE SEEN

## 2011-01-06 LAB — CBC
HCT: 39.9 % (ref 36.0–46.0)
Platelets: 232 10*3/uL (ref 150–400)
RDW: 12.3 % (ref 11.5–15.5)
WBC: 5.3 10*3/uL (ref 4.0–10.5)

## 2011-01-06 LAB — POCT PREGNANCY, URINE: Preg Test, Ur: NEGATIVE

## 2011-02-17 ENCOUNTER — Ambulatory Visit (INDEPENDENT_AMBULATORY_CARE_PROVIDER_SITE_OTHER): Payer: Self-pay

## 2011-02-17 ENCOUNTER — Inpatient Hospital Stay (INDEPENDENT_AMBULATORY_CARE_PROVIDER_SITE_OTHER)
Admission: RE | Admit: 2011-02-17 | Discharge: 2011-02-17 | Disposition: A | Discharge status: Home / Self Care | Payer: Self-pay | Source: Ambulatory Visit | Attending: Family Medicine | Admitting: Family Medicine

## 2011-02-17 DIAGNOSIS — T148XXA Other injury of unspecified body region, initial encounter: Secondary | ICD-10-CM

## 2011-05-27 ENCOUNTER — Inpatient Hospital Stay (INDEPENDENT_AMBULATORY_CARE_PROVIDER_SITE_OTHER)
Admission: RE | Admit: 2011-05-27 | Discharge: 2011-05-27 | Disposition: A | Discharge status: Home / Self Care | Payer: Self-pay | Source: Ambulatory Visit | Attending: Family Medicine | Admitting: Family Medicine

## 2011-05-27 DIAGNOSIS — M76899 Other specified enthesopathies of unspecified lower limb, excluding foot: Secondary | ICD-10-CM

## 2011-05-27 DIAGNOSIS — J309 Allergic rhinitis, unspecified: Secondary | ICD-10-CM

## 2011-06-15 LAB — URINE MICROSCOPIC-ADD ON

## 2011-06-15 LAB — URINALYSIS, ROUTINE W REFLEX MICROSCOPIC
Glucose, UA: NEGATIVE
Hgb urine dipstick: NEGATIVE
Ketones, ur: NEGATIVE
Nitrite: NEGATIVE
Protein, ur: NEGATIVE
Specific Gravity, Urine: 1.016
Urobilinogen, UA: 1
pH: 6
pH: 6

## 2011-06-15 LAB — URINE CULTURE

## 2011-06-15 LAB — PREGNANCY, URINE: Preg Test, Ur: NEGATIVE

## 2011-06-15 LAB — POCT PREGNANCY, URINE: Preg Test, Ur: NEGATIVE

## 2011-07-06 ENCOUNTER — Inpatient Hospital Stay (INDEPENDENT_AMBULATORY_CARE_PROVIDER_SITE_OTHER)
Admission: RE | Admit: 2011-07-06 | Discharge: 2011-07-06 | Disposition: A | Discharge status: Home / Self Care | Payer: Self-pay | Source: Ambulatory Visit | Attending: Family Medicine | Admitting: Family Medicine

## 2011-07-06 DIAGNOSIS — M799 Soft tissue disorder, unspecified: Secondary | ICD-10-CM

## 2011-08-17 ENCOUNTER — Emergency Department (INDEPENDENT_AMBULATORY_CARE_PROVIDER_SITE_OTHER)
Admission: EM | Admit: 2011-08-17 | Discharge: 2011-08-17 | Disposition: A | Discharge status: Home / Self Care | Payer: Self-pay | Source: Home / Self Care

## 2011-08-17 ENCOUNTER — Encounter (HOSPITAL_COMMUNITY): Payer: Self-pay

## 2011-08-17 DIAGNOSIS — E119 Type 2 diabetes mellitus without complications: Secondary | ICD-10-CM

## 2011-08-17 DIAGNOSIS — N898 Other specified noninflammatory disorders of vagina: Secondary | ICD-10-CM

## 2011-08-17 DIAGNOSIS — N939 Abnormal uterine and vaginal bleeding, unspecified: Secondary | ICD-10-CM

## 2011-08-17 HISTORY — DX: Essential (primary) hypertension: I10

## 2011-08-17 LAB — POCT PREGNANCY, URINE: Preg Test, Ur: NEGATIVE

## 2011-08-17 LAB — POCT I-STAT, CHEM 8
BUN: 14 mg/dL (ref 6–23)
Chloride: 100 mEq/L (ref 96–112)
HCT: 38 % (ref 36.0–46.0)
Sodium: 137 mEq/L (ref 135–145)
TCO2: 30 mmol/L (ref 0–100)

## 2011-08-17 MED ORDER — METFORMIN HCL 500 MG PO TABS: ORAL_TABLET | ORAL | Status: DC

## 2011-08-17 MED ORDER — GLIPIZIDE 5 MG PO TABS: 5.0000 mg | ORAL_TABLET | Freq: Two times a day (BID) | ORAL | Status: DC

## 2011-08-17 MED ORDER — INSULIN REGULAR HUMAN 100 UNIT/ML IJ SOLN
9.0000 [IU] | Freq: Once | INTRAMUSCULAR | Status: AC
  Administered 2011-08-17: 9 [IU] via SUBCUTANEOUS

## 2011-08-17 MED ORDER — INSULIN ASPART 100 UNIT/ML ~~LOC~~ SOLN
SUBCUTANEOUS | Status: AC
  Filled 2011-08-17: qty 1

## 2011-08-17 NOTE — ED Provider Notes (Signed)
History     CSN: [id number] Arrival date & time: 08/17/2011  6:59 PM   None     Chief Complaint  Patient presents with  . Vaginal Bleeding    (Consider location/radiation/quality/duration/timing/severity/associated sxs/prior treatment) HPI Comments: Pt states she had her Mirena IUD removed 4 mos ago to try to conceive. Has a hx of irrg menses prior to Mirena being inserted. Has one menses approx 2 mos ago and was light. Began a menses again 1 1/2 weeks ago. Seemed normal at onset but has lasted longer than usual, and occasionally is passing nickel sized blood clots. States the last few days the bleeding comes and goes and currently is not bleeding. At heaviest was changing protection 4-5 times a day.  No pelvic pain.  Hx of Diabetes. Has not been taking meds or monitoring blood sugar for a couple of months. Pt states her PCP is at [facility] but she is uninsured so is unable to afford an appt.   Patient is a 25 y.o. female presenting with vaginal bleeding. The history is provided by the patient.  Vaginal Bleeding This is a new problem. The current episode started more than 1 week ago. Progression since onset: comes and goes. Pertinent negatives include no chest pain, no abdominal pain, no headaches and no shortness of breath. The symptoms are aggravated by nothing. The symptoms are relieved by nothing. She has tried nothing for the symptoms.    Past Medical History  Diagnosis Date  . Diabetes mellitus   . Hypertension     History reviewed. No pertinent past surgical history.  History reviewed. No pertinent family history.  History  Substance Use Topics  . Smoking status: Never Smoker   . Smokeless tobacco: Not on file  . Alcohol Use: No    OB History    Grav Para Term Preterm Abortions TAB SAB Ect Mult Living                  Review of Systems  Constitutional: Negative for fever and chills.  Respiratory: Negative for shortness of breath.   Cardiovascular:  Negative for chest pain.  Gastrointestinal: Negative for nausea, vomiting and abdominal pain.  Genitourinary: Positive for vaginal bleeding. Negative for dysuria, urgency, vaginal discharge, vaginal pain and pelvic pain.  Neurological: Negative for light-headedness and headaches.    Allergies  Hydrocodone  Home Medications   Current Outpatient Rx  Name Route Sig Dispense Refill  . PRODIGY BLOOD GLUCOSE MONITOR W/DEVICE KIT Does not apply by Does not apply route as directed.      Marland Kitchen GLUCOSE BLOOD VI STRP Other 1 each by Other route as directed. Use as instructed. Disp 1 month supply     . LISINOPRIL 20 MG PO TABS Oral Take 20 mg by mouth daily.      Marland Kitchen METFORMIN HCL 850 MG PO TABS Oral Take 850 mg by mouth 2 (two) times daily.      Marland Kitchen PRODIGY TWIST TOP LANCETS 28G MISC  Use as directed. Disp 1 month supply     . TRIAMCINOLONE ACETONIDE 0.1 % EX OINT Topical Apply topically 2 (two) times daily as needed. Apply to affected areas 2x a day as needed. Do not use on face. Disp QS for 1 month       BP 149/88  Pulse 87  Temp(Src) 98.4 F (36.9 C) (Oral)  SpO2 98%  Physical Exam  Nursing note and vitals reviewed. Constitutional: She appears well-developed and well-nourished. No distress.  Cardiovascular: Normal  rate, regular rhythm and normal heart sounds.   Pulmonary/Chest: Effort normal and breath sounds normal.  Abdominal: Soft. She exhibits no mass. There is no tenderness.  Genitourinary: Uterus normal. There is no rash, tenderness or lesion on the right labia. There is no rash, tenderness or lesion on the left labia. Cervix exhibits no motion tenderness, no discharge and no friability. Right adnexum displays no mass, no tenderness and no fullness. Left adnexum displays no mass, no tenderness and no fullness. There is bleeding (few streaks of blood visible only) around the vagina. No erythema or tenderness around the vagina. No vaginal discharge found.    ED Course  Procedures (including  critical care time)   Labs Reviewed  POCT PREGNANCY, URINE  POCT PREGNANCY, URINE  I-STAT, CHEM 8  WET PREP, GENITAL  GC/CHLAMYDIA PROBE AMP, GENITAL   No results found.   No diagnosis found.    MDM  Labs reviewed. Discussed the importance of Diabetes control and risk of uncontrolled diabetes including blindness, MI, CVA, and renal failure. Encouraged better compliance and follow up. Also discussed with pt that she should use condoms for birth control, and wait to conceive until her blood sugars are better controlled.        Melody Comas, [place] 08/17/11 2144

## 2011-08-17 NOTE — ED Notes (Signed)
C/o her period is more heavy than normal and longer than normal w clots; has never had this before; sexually active w/o BC

## 2011-08-18 LAB — GC/CHLAMYDIA PROBE AMP, GENITAL: Chlamydia, DNA Probe: NEGATIVE

## 2011-08-18 NOTE — ED Provider Notes (Signed)
Medical screening examination/treatment/procedure(s) were performed by non-physician practitioner and as supervising physician I was immediately available for consultation/collaboration.   Breaunna Gottlieb DOUGLAS MD.    Irean Kendricks Douglas Bethany Cumming, MD 08/18/11 1655 

## 2011-10-09 ENCOUNTER — Ambulatory Visit (INDEPENDENT_AMBULATORY_CARE_PROVIDER_SITE_OTHER): Payer: Self-pay | Admitting: Family Medicine

## 2011-10-09 ENCOUNTER — Encounter: Payer: Self-pay | Admitting: Family Medicine

## 2011-10-09 VITALS — BP 115/79 | HR 92 | Temp 98.5°F | Ht 63.0 in | Wt 279.0 lb

## 2011-10-09 DIAGNOSIS — E119 Type 2 diabetes mellitus without complications: Secondary | ICD-10-CM

## 2011-10-09 DIAGNOSIS — R300 Dysuria: Secondary | ICD-10-CM

## 2011-10-09 DIAGNOSIS — R319 Hematuria, unspecified: Secondary | ICD-10-CM

## 2011-10-09 DIAGNOSIS — R112 Nausea with vomiting, unspecified: Secondary | ICD-10-CM

## 2011-10-09 DIAGNOSIS — N912 Amenorrhea, unspecified: Secondary | ICD-10-CM

## 2011-10-09 LAB — POCT URINALYSIS DIPSTICK
Bilirubin, UA: NEGATIVE
Ketones, UA: NEGATIVE
Leukocytes, UA: NEGATIVE
Nitrite, UA: NEGATIVE
Protein, UA: NEGATIVE

## 2011-10-09 LAB — GLUCOSE, CAPILLARY: Glucose-Capillary: 234 mg/dL — ABNORMAL HIGH (ref 70–99)

## 2011-10-09 LAB — CBC
MCH: 30 pg (ref 26.0–34.0)
Platelets: 202 10*3/uL (ref 150–400)
RBC: 3.97 MIL/uL (ref 3.87–5.11)
RDW: 14 % (ref 11.5–15.5)

## 2011-10-09 LAB — POCT UA - MICROSCOPIC ONLY

## 2011-10-09 LAB — POCT URINE PREGNANCY: Preg Test, Ur: NEGATIVE

## 2011-10-09 MED ORDER — GLIPIZIDE 5 MG PO TABS
5.0000 mg | ORAL_TABLET | Freq: Two times a day (BID) | ORAL | Status: DC
Start: 2011-10-09 — End: 2011-11-25

## 2011-10-09 MED ORDER — GLIPIZIDE 5 MG PO TABS: 5.0000 mg | ORAL_TABLET | Freq: Two times a day (BID) | ORAL | Status: DC

## 2011-10-09 NOTE — Patient Instructions (Addendum)
Melissa Washington,  Thank you for coming in to see me.  We will wait on the results of your UA and U preg. If it suggest UTI we will treat that.  You may have another stone, but this is less likely. If your pain persists or worsens please f/u with Dr. Rivka Safer for possible imaging to look for stones.  I suspect your sugars are high, we will check blood sugar and A1c today. If they are high you should restart your medication, I would suggest starting the glipizide first since the metformin made you sick to your stomach and make sure to follow-up with Dr. Rivka Safer.  I will call you with the results of your blood work.   -Dr. Armen Pickup    Not that pt waited for urine studies: U preg negative, UTI initially did not suggest infection but culture positive so now treating.

## 2011-10-12 ENCOUNTER — Telehealth: Payer: Self-pay | Admitting: Family Medicine

## 2011-10-12 DIAGNOSIS — N390 Urinary tract infection, site not specified: Secondary | ICD-10-CM | POA: Insufficient documentation

## 2011-10-12 DIAGNOSIS — R319 Hematuria, unspecified: Secondary | ICD-10-CM | POA: Insufficient documentation

## 2011-10-12 MED ORDER — SULFAMETHOXAZOLE-TRIMETHOPRIM 800-160 MG PO TABS: 1.0000 | ORAL_TABLET | Freq: Two times a day (BID) | ORAL | Status: AC

## 2011-10-12 NOTE — Telephone Encounter (Signed)
Attempted to call pt at home and at mother's number about E. Coli and urine and plan to treat with Bactrim DS x 5 days. Unable to reach pt directly or leave VM at either number.

## 2011-10-12 NOTE — Assessment & Plan Note (Signed)
A: Declined due to diet and medication non-compliance. P: restart glipizide 5 mg Po daily.  F/u with PCP to discuss restarting metformin at lower dose vs. insulin.

## 2011-10-12 NOTE — Assessment & Plan Note (Signed)
A: E. Coli isolated from urine culture. Patient symptomatic with hematuria and frequency. P: treat with Bactrim Ds x 5 days. F/u sensitivities.

## 2011-10-12 NOTE — Progress Notes (Signed)
  Subjective:    Patient ID: Melissa Washington, female    DOB: [birth year], 26 y.o.   MRN: [id number]  HPI 26 yo F with diabetes, non compliant with medications, presents with two days of strong smelling urine and hematuria this AM. She is not taking gli[izide or metformin. She says the metformin caused too much GI upset, she has not discussed this with her PCP. She also does not check her blood sugars. Along with strong smelling urine and hematuria this AM she admits to increased urinary frequency. She denies dysuria or urgency. She also describes 3 weeks of intermittent dizziness, nausea in the morning. She denies vomiting and fever/chills. LMP 09/25/11.    Review of Systems Pertinent ROS as per HPI    Objective:   Physical Exam Filed Vitals:   10/09/11 1422  BP: 115/79  Pulse: 92  Temp: 98.5 F (36.9 C)  General appearance: alert, cooperative, no distress and moderately obese Throat: lips, mucosa, and tongue normal; teeth and gums normal Back: symmetric, no curvature. ROM normal. No CVA tenderness., mild low back tenderness. No paraspinal tenderness.  Lungs: clear to auscultation bilaterally Heart: regular rate and rhythm, S1, S2 normal, no murmur, click, rub or gallop Abdomen: obese, non tender, discomfort to palpation in epigastric area and suprapubic area. No rebound or guarding.  Skin: Skin color, texture, turgor normal. No rashes or lesions       Assessment & Plan:

## 2011-10-13 LAB — URINE CULTURE

## 2011-10-20 ENCOUNTER — Encounter: Payer: Self-pay | Admitting: *Deleted

## 2011-10-20 NOTE — Telephone Encounter (Signed)
This encounter was created in error - please disregard.

## 2011-10-29 ENCOUNTER — Encounter (HOSPITAL_COMMUNITY): Payer: Self-pay | Admitting: Emergency Medicine

## 2011-10-29 ENCOUNTER — Emergency Department (HOSPITAL_COMMUNITY)
Admission: EM | Admit: 2011-10-29 | Discharge: 2011-10-29 | Disposition: A | Discharge status: Home / Self Care | Payer: Self-pay | Source: Home / Self Care | Attending: Emergency Medicine | Admitting: Emergency Medicine

## 2011-10-29 DIAGNOSIS — M62838 Other muscle spasm: Secondary | ICD-10-CM

## 2011-10-29 DIAGNOSIS — S43402A Unspecified sprain of left shoulder joint, initial encounter: Secondary | ICD-10-CM

## 2011-10-29 DIAGNOSIS — IMO0002 Reserved for concepts with insufficient information to code with codable children: Secondary | ICD-10-CM

## 2011-10-29 MED ORDER — IBUPROFEN 600 MG PO TABS: 600.0000 mg | ORAL_TABLET | Freq: Four times a day (QID) | ORAL | Status: AC | PRN

## 2011-10-29 MED ORDER — TRAMADOL HCL 50 MG PO TABS: ORAL_TABLET | ORAL | Status: AC

## 2011-10-29 MED ORDER — CYCLOBENZAPRINE HCL 10 MG PO TABS: 10.0000 mg | ORAL_TABLET | Freq: Three times a day (TID) | ORAL | Status: AC | PRN

## 2011-10-29 NOTE — ED Provider Notes (Signed)
History     CSN: [id number]  Arrival date & time 10/29/11  4098   First MD Initiated Contact with Patient 10/29/11 2003      Chief Complaint  Patient presents with  . Shoulder Pain    (Consider location/radiation/quality/duration/timing/severity/associated sxs/prior treatment) HPI Comments: Patient with dull, achy, nonradiating left shoulder and left neck pain described as "soreness". starting yesterday. Reports swelling along left shoulder. Pain is worse with certain movements of left arm, such as forward flexion, rowing motions. 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 Does not recall any specific trauma to the shoulder. Reports similar episodes of this in the past, and was thought to have a muscle strain/spasm previously. Was sent home with muscle relaxants, and rest last time with improvement. No nausea, vomiting, chest pain, shortness breath, weakness, numbness, redness, rash, bruising, arm, wrist or hand pain.  ROS as noted in HPI. All other ROS negative.   Patient is a 26 y.o. female presenting with shoulder pain. The history is provided by the patient. No language interpreter was used.  Shoulder Pain This is a recurrent problem. The current episode started yesterday. Pertinent negatives include no chest pain, no abdominal pain, no headaches and no shortness of breath.    Past Medical History  Diagnosis Date  . Diabetes mellitus   . Hypertension     History reviewed. No pertinent past surgical history.  History reviewed. No pertinent family history.  History  Substance Use Topics  . Smoking status: Never Smoker   . Smokeless tobacco: Not on file  . Alcohol Use: No    OB History    Grav Para Term Preterm Abortions TAB SAB Ect Mult Living                  Review of Systems  Respiratory: Negative for shortness of breath.   Cardiovascular: Negative for chest pain.  Gastrointestinal: Negative for abdominal pain.    Neurological: Negative for headaches.    Allergies  Review of patient's allergies indicates no active allergies.  Home Medications   Current Outpatient Rx  Name Route Sig Dispense Refill  . PRODIGY BLOOD GLUCOSE MONITOR W/DEVICE KIT Does not apply by Does not apply route as directed.      . CYCLOBENZAPRINE HCL 10 MG PO TABS Oral Take 1 tablet (10 mg total) by mouth 3 (three) times daily as needed for muscle spasms. 20 tablet 0  . GLIPIZIDE 5 MG PO TABS Oral Take 1 tablet (5 mg total) by mouth 2 (two) times daily before a meal. 60 tablet 0  . GLUCOSE BLOOD VI STRP Other 1 each by Other route as directed. Use as instructed. Disp 1 month supply     . IBUPROFEN 600 MG PO TABS Oral Take 1 tablet (600 mg total) by mouth every 6 (six) hours as needed for pain. 30 tablet 0  . LISINOPRIL 20 MG PO TABS Oral Take 20 mg by mouth daily.      Marland Kitchen METFORMIN HCL 500 MG PO TABS  Take 1 tab po once daily with food, then increase to bid. 40 tablet 0  . PRODIGY TWIST TOP LANCETS 28G MISC  Use as directed. Disp 1 month supply     . TRAMADOL HCL 50 MG PO TABS  1-2 tabs po q 6 hr prn pain Maximum dose= 8 tablets per day 20 tablet 0    BP 121/80  Pulse 78  Temp(Src) 98.4 F (36.9 C) (Oral)  Resp 20  SpO2 98%  LMP 09/29/2011  Physical Exam  Nursing note and vitals reviewed. Constitutional: She is oriented to person, place, and time. She appears well-developed and well-nourished. No distress.  HENT:  Head: Normocephalic and atraumatic.  Eyes: Conjunctivae and EOM are normal.  Neck: Normal range of motion.  Cardiovascular: Regular rhythm.   Pulmonary/Chest: Effort normal.  Abdominal: She exhibits no distension.  Musculoskeletal: Normal range of motion.       Left shoulder: She exhibits spasm.       Arms:      Trapezial tenderness, spasm. Left shoulder with ROM normal,Drop test  negative, clavicle NT, A/C joint NT , scapula NT.  proximal humerus NT , shoulder joint mildly tender. Positive empty can  test. Negative apprehension test. Motor strength normal. Sensation intact LT over deltoid region, distal NVI with hand on affected side having intact sensation and strength in the distribution of the median, radial, and ulnar nerve. Patient with poorly fitting bra, has bilateral indentations on shoulders from straps.  Neurological: She is alert and oriented to person, place, and time.  Skin: Skin is warm and dry.  Psychiatric: She has a normal mood and affect. Her behavior is normal. Judgment and thought content normal.    ED Course  Procedures (including critical care time)  Labs Reviewed - No data to display No results found.   1. Sprain of left shoulder   2. Muscle spasm       MDM  Patient with apparent muscle spasm along left trapezius. Think that there is also a component of impingement syndrome as well. Has full range of motion, no instability on exam.  Patient has poorly fitting bra with narrow straps. Advised patient to get a better fitting bra with better support and wider shoulder straps. Will send home with NSAIDs, muscle relaxants, tramadol. Will have her followup with occupational health in several days as patient states this happened while working.  Luiz Blare, MD 10/29/11 2131

## 2011-10-29 NOTE — ED Notes (Signed)
C/o left shoulder and left lowerback pain

## 2011-11-20 ENCOUNTER — Emergency Department (HOSPITAL_COMMUNITY)
Admission: EM | Admit: 2011-11-20 | Discharge: 2011-11-20 | Disposition: A | Discharge status: Home / Self Care | Payer: Medicaid Other | Attending: Emergency Medicine | Admitting: Emergency Medicine

## 2011-11-20 ENCOUNTER — Encounter (HOSPITAL_COMMUNITY): Payer: Self-pay | Admitting: *Deleted

## 2011-11-20 DIAGNOSIS — E119 Type 2 diabetes mellitus without complications: Secondary | ICD-10-CM | POA: Insufficient documentation

## 2011-11-20 DIAGNOSIS — Z79899 Other long term (current) drug therapy: Secondary | ICD-10-CM | POA: Insufficient documentation

## 2011-11-20 DIAGNOSIS — I10 Essential (primary) hypertension: Secondary | ICD-10-CM | POA: Insufficient documentation

## 2011-11-20 DIAGNOSIS — R509 Fever, unspecified: Secondary | ICD-10-CM | POA: Insufficient documentation

## 2011-11-20 DIAGNOSIS — R51 Headache: Secondary | ICD-10-CM | POA: Insufficient documentation

## 2011-11-20 DIAGNOSIS — R110 Nausea: Secondary | ICD-10-CM | POA: Insufficient documentation

## 2011-11-20 DIAGNOSIS — N390 Urinary tract infection, site not specified: Secondary | ICD-10-CM | POA: Insufficient documentation

## 2011-11-20 DIAGNOSIS — IMO0001 Reserved for inherently not codable concepts without codable children: Secondary | ICD-10-CM | POA: Insufficient documentation

## 2011-11-20 DIAGNOSIS — H538 Other visual disturbances: Secondary | ICD-10-CM | POA: Insufficient documentation

## 2011-11-20 LAB — URINALYSIS, ROUTINE W REFLEX MICROSCOPIC
Glucose, UA: NEGATIVE mg/dL
Ketones, ur: NEGATIVE mg/dL
Nitrite: POSITIVE — AB
Protein, ur: NEGATIVE mg/dL
Urobilinogen, UA: 1 mg/dL (ref 0.0–1.0)

## 2011-11-20 LAB — GLUCOSE, CAPILLARY: Glucose-Capillary: 147 mg/dL — ABNORMAL HIGH (ref 70–99)

## 2011-11-20 LAB — PREGNANCY, URINE: Preg Test, Ur: NEGATIVE

## 2011-11-20 LAB — URINE MICROSCOPIC-ADD ON

## 2011-11-20 MED ORDER — SODIUM CHLORIDE 0.9 % IV BOLUS (SEPSIS)
1000.0000 mL | Freq: Once | INTRAVENOUS | Status: AC
  Administered 2011-11-20: 1000 mL via INTRAVENOUS

## 2011-11-20 MED ORDER — NITROFURANTOIN MONOHYD MACRO 100 MG PO CAPS
100.0000 mg | ORAL_CAPSULE | Freq: Once | ORAL | Status: AC
  Administered 2011-11-20: 100 mg via ORAL
  Filled 2011-11-20: qty 1

## 2011-11-20 MED ORDER — NITROFURANTOIN MONOHYD MACRO 100 MG PO CAPS: 100.0000 mg | ORAL_CAPSULE | Freq: Two times a day (BID) | ORAL | Status: DC

## 2011-11-20 MED ORDER — KETOROLAC TROMETHAMINE 30 MG/ML IJ SOLN
30.0000 mg | Freq: Once | INTRAMUSCULAR | Status: AC
  Administered 2011-11-20: 30 mg via INTRAVENOUS
  Filled 2011-11-20: qty 1

## 2011-11-20 NOTE — Discharge Instructions (Signed)

## 2011-11-20 NOTE — ED Provider Notes (Signed)
History     CSN: [id number]  Arrival date & time 11/20/11  0239   First MD Initiated Contact with Patient 11/20/11 0408      Chief Complaint  Patient presents with  . Headache  . Generalized Body Aches  . Blurred Vision    (Consider location/radiation/quality/duration/timing/severity/associated sxs/prior treatment) HPI Comments: Patient develop a low-grade fever last night.  101.1, with headache and generalized myalgias.  Last mental cycle was in January 2 has irregular cycles.  There is a slight chance that she may be pregnant.  She's not having any dysuria, vaginal discharge, nausea, vomiting, constipation  Patient is a 26 y.o. female presenting with headaches. The history is provided by the patient.  Headache  This is a new problem. The current episode started 6 to 12 hours ago. The problem occurs constantly. The headache is associated with nothing. The quality of the pain is described as dull. The pain is at a severity of 4/10. The pain is mild. The pain does not radiate. Associated symptoms include a fever and nausea. Pertinent negatives include no shortness of breath and no vomiting.    Past Medical History  Diagnosis Date  . Diabetes mellitus   . Hypertension     History reviewed. No pertinent past surgical history.  History reviewed. No pertinent family history.  History  Substance Use Topics  . Smoking status: Never Smoker   . Smokeless tobacco: Not on file  . Alcohol Use: No    OB History    Grav Para Term Preterm Abortions TAB SAB Ect Mult Living                  Review of Systems  Constitutional: Positive for fever. Negative for chills.  HENT: Negative for congestion.   Respiratory: Negative for shortness of breath.   Gastrointestinal: Positive for nausea. Negative for vomiting.  Genitourinary: Negative for dysuria, frequency, vaginal bleeding and vaginal discharge.  Musculoskeletal: Positive for myalgias.  Neurological: Positive for headaches.     Allergies  Review of patient's allergies indicates no known allergies.  Home Medications   Current Outpatient Rx  Name Route Sig Dispense Refill  . GLIPIZIDE 5 MG PO TABS Oral Take 1 tablet (5 mg total) by mouth 2 (two) times daily before a meal. 60 tablet 0  . LISINOPRIL 20 MG PO TABS Oral Take 20 mg by mouth daily.      Marland Kitchen METFORMIN HCL 500 MG PO TABS  Take 1 tab po once daily with food, then increase to bid. 40 tablet 0    BP 150/73  Pulse 94  Temp(Src) 98.9 F (37.2 C) (Oral)  Resp 20  SpO2 100%  LMP 09/29/2011  Physical Exam  Constitutional: She is oriented to person, place, and time. She appears well-developed and well-nourished.  HENT:  Head: Normocephalic.  Eyes: Pupils are equal, round, and reactive to light.  Neck: Normal range of motion.  Cardiovascular: Normal rate.   Pulmonary/Chest: Effort normal.  Abdominal: Soft.  Musculoskeletal: Normal range of motion.  Neurological: She is alert and oriented to person, place, and time.  Skin: Skin is warm and dry.  Psychiatric: She has a normal mood and affect.    ED Course  Procedures (including critical care time)  Labs Reviewed  GLUCOSE, CAPILLARY - Abnormal; Notable for the following:    Glucose-Capillary 147 (*)    All other components within normal limits  URINALYSIS, ROUTINE W REFLEX MICROSCOPIC - Abnormal; Notable for the following:    APPearance CLOUDY (*)  Hgb urine dipstick LARGE (*)    Nitrite POSITIVE (*)    Leukocytes, UA SMALL (*)    All other components within normal limits  URINE MICROSCOPIC-ADD ON - Abnormal; Notable for the following:    Bacteria, UA MANY (*)    All other components within normal limits  PREGNANCY, URINE   No results found.   1. UTI (lower urinary tract infection)       MDM  Suspect UTI        Arman Filter, NP 11/20/11 [phone]

## 2011-11-20 NOTE — ED Notes (Signed)
sts that h/a is 10/10.  sts that she awoke from sleep is very achy in all all joints. sts had fever but not now.  sts that her abd pain just started.

## 2011-11-20 NOTE — ED Provider Notes (Signed)
Medical screening examination/treatment/procedure(s) were performed by non-physician practitioner and as supervising physician I was immediately available for consultation/collaboration.  Jasmine Awe, MD 11/20/11 [phone]

## 2011-11-20 NOTE — ED Notes (Signed)
Pt in c/o headache and body aches, states she also noted blurred vision tonight, fever earlier today

## 2011-11-25 ENCOUNTER — Telehealth: Payer: Self-pay | Admitting: Family Medicine

## 2011-11-25 ENCOUNTER — Inpatient Hospital Stay (HOSPITAL_COMMUNITY): Payer: Medicaid Other

## 2011-11-25 ENCOUNTER — Encounter: Payer: Self-pay | Admitting: Family Medicine

## 2011-11-25 ENCOUNTER — Inpatient Hospital Stay (HOSPITAL_COMMUNITY)
Admission: AD | Admit: 2011-11-25 | Discharge: 2011-11-26 | DRG: 835 | Disposition: A | Discharge status: Other Acute Inpatient Hospital | Payer: Medicaid Other | Source: Ambulatory Visit | Attending: Family Medicine | Admitting: Family Medicine

## 2011-11-25 ENCOUNTER — Ambulatory Visit (INDEPENDENT_AMBULATORY_CARE_PROVIDER_SITE_OTHER): Payer: Self-pay | Admitting: Family Medicine

## 2011-11-25 VITALS — BP 140/89 | HR 108 | Temp 100.4°F | Ht 62.0 in | Wt 280.3 lb

## 2011-11-25 DIAGNOSIS — E1165 Type 2 diabetes mellitus with hyperglycemia: Secondary | ICD-10-CM | POA: Insufficient documentation

## 2011-11-25 DIAGNOSIS — Z794 Long term (current) use of insulin: Secondary | ICD-10-CM | POA: Insufficient documentation

## 2011-11-25 DIAGNOSIS — IMO0002 Reserved for concepts with insufficient information to code with codable children: Secondary | ICD-10-CM

## 2011-11-25 DIAGNOSIS — N12 Tubulo-interstitial nephritis, not specified as acute or chronic: Secondary | ICD-10-CM

## 2011-11-25 DIAGNOSIS — D61818 Other pancytopenia: Secondary | ICD-10-CM

## 2011-11-25 DIAGNOSIS — N390 Urinary tract infection, site not specified: Secondary | ICD-10-CM

## 2011-11-25 DIAGNOSIS — L2089 Other atopic dermatitis: Secondary | ICD-10-CM | POA: Diagnosis present

## 2011-11-25 DIAGNOSIS — D72819 Decreased white blood cell count, unspecified: Secondary | ICD-10-CM

## 2011-11-25 DIAGNOSIS — C9500 Acute leukemia of unspecified cell type not having achieved remission: Principal | ICD-10-CM | POA: Diagnosis present

## 2011-11-25 DIAGNOSIS — E119 Type 2 diabetes mellitus without complications: Secondary | ICD-10-CM | POA: Diagnosis present

## 2011-11-25 DIAGNOSIS — I10 Essential (primary) hypertension: Secondary | ICD-10-CM | POA: Insufficient documentation

## 2011-11-25 LAB — POCT UA - MICROSCOPIC ONLY

## 2011-11-25 LAB — POCT URINALYSIS DIPSTICK
Bilirubin, UA: NEGATIVE
Glucose, UA: 100
Ketones, UA: NEGATIVE
Nitrite, UA: POSITIVE

## 2011-11-25 LAB — COMPREHENSIVE METABOLIC PANEL
AST: 16 U/L (ref 0–37)
Albumin: 3.8 g/dL (ref 3.5–5.2)
Calcium: 9.6 mg/dL (ref 8.4–10.5)
Chloride: 101 mEq/L (ref 96–112)
Creatinine, Ser: 0.7 mg/dL (ref 0.50–1.10)
Total Bilirubin: 0.9 mg/dL (ref 0.3–1.2)
Total Protein: 7 g/dL (ref 6.0–8.3)

## 2011-11-25 LAB — CBC
HCT: 22.6 % — ABNORMAL LOW (ref 36.0–46.0)
Hemoglobin: 7.8 g/dL — ABNORMAL LOW (ref 12.0–15.0)
MCH: 30.4 pg (ref 26.0–34.0)
MCV: 87.9 fL (ref 78.0–100.0)
RBC: 2.57 MIL/uL — ABNORMAL LOW (ref 3.87–5.11)
WBC: 2.9 10*3/uL — ABNORMAL LOW (ref 4.0–10.5)

## 2011-11-25 LAB — DIFFERENTIAL
Band Neutrophils: 0 % (ref 0–10)
Basophils Absolute: 0 10*3/uL (ref 0.0–0.1)
Basophils Relative: 0 % (ref 0–1)
Eosinophils Absolute: 0 10*3/uL (ref 0.0–0.7)
Eosinophils Relative: 0 % (ref 0–5)
Metamyelocytes Relative: 0 %
Monocytes Absolute: 0.6 10*3/uL (ref 0.1–1.0)
Monocytes Relative: 22 % — ABNORMAL HIGH (ref 3–12)
Myelocytes: 0 %

## 2011-11-25 LAB — GLUCOSE, CAPILLARY: Glucose-Capillary: 171 mg/dL — ABNORMAL HIGH (ref 70–99)

## 2011-11-25 LAB — GRAM STAIN: Special Requests: NORMAL

## 2011-11-25 LAB — PREGNANCY, URINE: Preg Test, Ur: NEGATIVE

## 2011-11-25 MED ORDER — ACETAMINOPHEN 500 MG PO TABS
500.0000 mg | ORAL_TABLET | Freq: Once | ORAL | Status: AC
  Administered 2011-11-25: 500 mg via ORAL

## 2011-11-25 MED ORDER — MORPHINE SULFATE 2 MG/ML IJ SOLN
2.0000 mg | INTRAMUSCULAR | Status: DC | PRN
  Administered 2011-11-25 – 2011-11-26 (×5): 2 mg via INTRAVENOUS
  Filled 2011-11-25 (×5): qty 1

## 2011-11-25 MED ORDER — CHLORHEXIDINE GLUCONATE 0.12 % MT SOLN
15.0000 mL | Freq: Two times a day (BID) | OROMUCOSAL | Status: DC
  Administered 2011-11-25: 15 mL via OROMUCOSAL
  Filled 2011-11-25: qty 15

## 2011-11-25 MED ORDER — ACETAMINOPHEN 325 MG PO TABS
650.0000 mg | ORAL_TABLET | Freq: Four times a day (QID) | ORAL | Status: DC | PRN
  Administered 2011-11-26 (×2): 650 mg via ORAL
  Filled 2011-11-25 (×2): qty 2

## 2011-11-25 MED ORDER — ACETAMINOPHEN 650 MG RE SUPP: 650.0000 mg | Freq: Four times a day (QID) | RECTAL | Status: DC | PRN

## 2011-11-25 MED ORDER — SODIUM CHLORIDE 0.9 % IV SOLN
INTRAVENOUS | Status: DC
  Administered 2011-11-25: via INTRAVENOUS

## 2011-11-25 MED ORDER — ONDANSETRON HCL 4 MG PO TABS: 4.0000 mg | ORAL_TABLET | Freq: Four times a day (QID) | ORAL | Status: DC | PRN

## 2011-11-25 MED ORDER — INFLUENZA VIRUS VACC SPLIT PF IM SUSP
0.5000 mL | INTRAMUSCULAR | Status: AC
  Administered 2011-11-26: 0.5 mL via INTRAMUSCULAR
  Filled 2011-11-25: qty 0.5

## 2011-11-25 MED ORDER — DEXTROSE 5 % IV SOLN
1.0000 g | INTRAVENOUS | Status: DC
  Administered 2011-11-25: 1 g via INTRAVENOUS
  Filled 2011-11-25 (×2): qty 10

## 2011-11-25 MED ORDER — ONDANSETRON HCL 4 MG/2ML IJ SOLN
4.0000 mg | Freq: Four times a day (QID) | INTRAMUSCULAR | Status: DC | PRN
  Administered 2011-11-25: 4 mg via INTRAVENOUS
  Filled 2011-11-25: qty 2

## 2011-11-25 MED ORDER — INSULIN ASPART 100 UNIT/ML ~~LOC~~ SOLN
0.0000 [IU] | Freq: Three times a day (TID) | SUBCUTANEOUS | Status: DC
  Administered 2011-11-25 – 2011-11-26 (×4): 3 [IU] via SUBCUTANEOUS
  Filled 2011-11-25: qty 3

## 2011-11-25 MED ORDER — BIOTENE DRY MOUTH MT LIQD
15.0000 mL | Freq: Two times a day (BID) | OROMUCOSAL | Status: DC
  Administered 2011-11-25: 15 mL via OROMUCOSAL

## 2011-11-25 MED ORDER — SODIUM CHLORIDE 0.9 % IV BOLUS (SEPSIS)
500.0000 mL | Freq: Once | INTRAVENOUS | Status: AC
  Administered 2011-11-25: 500 mL via INTRAVENOUS

## 2011-11-25 NOTE — H&P (Signed)
I interviewed and examined this patient and discussed the care plan with Dr. Cristal Ford in our [facility] and agree with assessment and plan as documented in the admission note for today. Admission is necessary due to her complicated illness and demonstrated inability to follow up on the previously prescribed treatment.   Arsenio Schnorr A. Sheffield Slider, MD Family Medicine Teaching Service Attending  11/25/2011 9:48 PM

## 2011-11-25 NOTE — Progress Notes (Signed)
Direct admit from MD office to room 5154 complaining of left flank pain. MD aware of the new admit, will monitor.

## 2011-11-25 NOTE — Progress Notes (Signed)
  Subjective:    Patient ID: Melissa Washington, female    DOB: Feb 03, 1986, 26 y.o.   MRN: [id number]  HPI  26 year old female with a history of diabetes and nephrolithiasis who presents with and 6-7 days left flank and abdominal pain and fevers up to 102. Was evaluated in the emergency department March 1 and found to have UTI, sent home with prescription for Macrobid. Patient did not fill this as she cannot afford it. Has been taking Motrin and in cold sinus medication since that time. Has worsening abdominal pain and continues to be febrile with some nausea and general malaise. Has noted scant blood in urine with wiping. Has a previous history of nephrolithiasis which passed spontaneously.   Review of Systems Denies syncope, pelvic pain, discharge, shortness of breath, chest pain, diarrhea, emesis, rash.     Objective:   Physical Exam  Vitals reviewed. Constitutional: She is oriented to person, place, and time. She appears well-developed and well-nourished. She appears distressed.       Tearful, very uncomfortable.  HENT:  Head: Normocephalic and atraumatic.       MM mildly dry.  Eyes: EOM are normal. Pupils are equal, round, and reactive to light.  Cardiovascular: Regular rhythm, normal heart sounds and intact distal pulses.   No murmur heard.      Mild tachycardia  Pulmonary/Chest: Effort normal. No respiratory distress. She has no wheezes.  Abdominal: Soft. Bowel sounds are normal. She exhibits no distension. There is tenderness. There is no rebound and no guarding.       Moderate tenderness palpation in left flank, suprapubic and left costovertebral angle.  Musculoskeletal: She exhibits no edema and no tenderness.  Neurological: She is alert and oriented to person, place, and time. No cranial nerve deficit. Coordination normal.  Skin: No rash noted.  Psychiatric: She has a normal mood and affect.       Assessment & Plan:   1. UTI (lower urinary tract infection)  Urine culture,  POCT urinalysis dipstick, POCT UA - Microscopic Only, POCT Glucose (CBG), acetaminophen (TYLENOL) tablet 500 mg  2. Pyelonephritis     Concern for recurrent nephrolithiasis. Will admit to inpatient for pyelonephritis for imaging and IV abx.

## 2011-11-25 NOTE — Telephone Encounter (Signed)
Patient is calling to discuss some symptoms that she is having.  She had been seen at the ER for a temp that is continuing and she wants to know if she needs to go back.

## 2011-11-25 NOTE — H&P (Signed)
Family Medicine Teaching Service History and Physical  PCP: Rivka Safer  Chief Complaint: fever, abdominal pain  Subjective:   Patient is a 26 y.o. female  with a history of diabetes and nephrolithiasis who presents with and 6-7 days left flank and abdominal pain and fevers up to 102. Was evaluated in the emergency department March 1 and found to have UTI, sent home with prescription for Macrobid. Patient did not fill this as she cannot afford it. Has been taking Motrin and cold sinus medication since that time. Worsening left flank, back and abdominal pain and continues to be febrile with some dysuria, nausea and general malaise. Has noted scant blood in urine with wiping. Has a previous history of nephrolithiasis which passed spontaneously. Family history of renal stones as well.  Review of Systems  Denies syncope, pelvic pain, discharge, shortness of breath, chest pain, diarrhea, emesis, rash.    Patient Active Problem List  Diagnoses Date Noted  . Pyelonephritis 11/25/2011  . UTI (lower urinary tract infection) 10/12/2011  . IUD migration 12/04/2010  . Cyst, breast 12/04/2010  . ABSCESS, SKIN 04/03/2010  . ECZEMA, ATOPIC 05/20/2007  . DIABETES-TYPE 2 12/30/2006  . HYPERTENSION, BENIGN ESSENTIAL 12/30/2006   Past Medical History  Diagnosis Date  . Diabetes mellitus   . Hypertension   . Nephrolithiasis     No past surgical history on file.  No prescriptions prior to admission   No Known Allergies  History  Substance Use Topics  . Smoking status: Never Smoker   . Smokeless tobacco: Not on file  . Alcohol Use: No    Family History  Problem Relation Age of Onset  . Kidney disease Mother   . Kidney disease Maternal Grandmother   . Diabetes Maternal Grandmother     Objective:   T 100.4, BP 140/89, T 108, Wt 280 lb Constitutional: She is oriented to person, place, and time. She appears well-developed and well-nourished. She appears distressed.  Tearful, very uncomfortable.    HENT:  Head: Normocephalic and atraumatic.  MM mildly dry.  Eyes: EOM are normal. Pupils are equal, round, and reactive to light.  Cardiovascular: Regular rhythm, normal heart sounds and intact distal pulses.  No murmur heard. Mild tachycardia  Pulmonary/Chest: Effort normal. No respiratory distress. She has no wheezes.  Abdominal: Soft. Bowel sounds are normal. She exhibits no distension. There is tenderness. There is no rebound and no guarding.  Moderate tenderness palpation in left flank, suprapubic and left costovertebral angle.  Musculoskeletal: She exhibits no edema and no tenderness.  Neurological: She is alert and oriented to person, place, and time. No cranial nerve deficit. Coordination normal.  Skin: No rash noted.  Psychiatric: She has a normal mood and affect.       CBG 157  Assessment:   Plan:   26 year old with history diabetes type 2 and nephrolithiasis presenting with pyelonephritis and presumed nephrolithiasis.  1. Pyelonephritis. Urine gram stain, culture and blood cultures ordered. We'll initiate empiric Rocephin IV every 24 hours.  With history of nephrolithiasis and blood on UA will check renal ultrasound to evaluate for obstruction or hydronephrosis. We'll check renal function labs. For pain control morphine 1-2 mg IV every 3 hours when necessary.  2. Diabetes. Controlled currently with CBG 157. Will hold oral medications and start sliding scale insulin.  3. Hypertension. Slight above goal with pain. Will hold ACE inhibitor pending renal function results.  4. FEN. We'll advance diet as tolerated to full liquids. Check electrolytes. Start IV fluids  normal saline at 75 cc per hour. May require bolus if tachycardia persists after pain management.  5. PPX. SCDs.  6. Dispo. Admit to inpatient med-surg. Pending clinical workup and treatment. Full code.  Lloyd Huger, MD [facility] Resident - PGY-2 11/25/2011 5:53 PM

## 2011-11-25 NOTE — Telephone Encounter (Signed)
Returned call to patient.  Has had fever x 5 days and right abdominal pain.  Was seen in ED and dx'd with UTI.  Received IV fluids and was given Rx for abx.  Does not have insurance and has not been able to pick up abx.  Informed patient that UTI can spread further up urinary tract if not treated. No appt available today with PCP.   Appt scheduled with Dr. Cristal Ford today at 3:45 pm to be re-evaluated.  Gaylene Brooks, RN

## 2011-11-26 ENCOUNTER — Encounter (HOSPITAL_COMMUNITY): Payer: Self-pay | Admitting: Radiology

## 2011-11-26 ENCOUNTER — Inpatient Hospital Stay (HOSPITAL_COMMUNITY): Payer: Medicaid Other

## 2011-11-26 ENCOUNTER — Other Ambulatory Visit: Payer: Self-pay | Admitting: Oncology

## 2011-11-26 ENCOUNTER — Telehealth: Payer: Self-pay | Admitting: Family Medicine

## 2011-11-26 DIAGNOSIS — D61818 Other pancytopenia: Secondary | ICD-10-CM

## 2011-11-26 DIAGNOSIS — D72819 Decreased white blood cell count, unspecified: Secondary | ICD-10-CM

## 2011-11-26 LAB — DIC (DISSEMINATED INTRAVASCULAR COAGULATION)PANEL
Platelets: 33 10*3/uL — ABNORMAL LOW (ref 150–400)
Smear Review: NONE SEEN
aPTT: 37 seconds (ref 24–37)

## 2011-11-26 LAB — RETICULOCYTES
RBC.: 2.5 MIL/uL — ABNORMAL LOW (ref 3.87–5.11)
Retic Count, Absolute: 37.5 10*3/uL (ref 19.0–186.0)
Retic Ct Pct: 1.5 % (ref 0.4–3.1)

## 2011-11-26 LAB — CBC
MCV: 88.2 fL (ref 78.0–100.0)
Platelets: 28 10*3/uL — CL (ref 150–400)
RBC: 2.46 MIL/uL — ABNORMAL LOW (ref 3.87–5.11)
RDW: 14.4 % (ref 11.5–15.5)
WBC: 3 10*3/uL — ABNORMAL LOW (ref 4.0–10.5)

## 2011-11-26 LAB — HEMOGLOBIN A1C
Hgb A1c MFr Bld: 7.7 % — ABNORMAL HIGH (ref <5.7)
Mean Plasma Glucose: 174 mg/dL — ABNORMAL HIGH (ref <117)

## 2011-11-26 LAB — BASIC METABOLIC PANEL
CO2: 23 mEq/L (ref 19–32)
Calcium: 8.5 mg/dL (ref 8.4–10.5)
Chloride: 103 mEq/L (ref 96–112)
Creatinine, Ser: 0.75 mg/dL (ref 0.50–1.10)
GFR calc Af Amer: >90 mL/min (ref >90)
Sodium: 136 mEq/L (ref 135–145)

## 2011-11-26 LAB — APTT: aPTT: 35 seconds (ref 24–37)

## 2011-11-26 LAB — FOLATE: Folate: 13.1 ng/mL

## 2011-11-26 LAB — GLUCOSE, CAPILLARY: Glucose-Capillary: 167 mg/dL — ABNORMAL HIGH (ref 70–99)

## 2011-11-26 LAB — IRON AND TIBC
Saturation Ratios: 29 % (ref 20–55)
UIBC: 183 ug/dL (ref 125–400)

## 2011-11-26 LAB — TECHNOLOGIST SMEAR REVIEW

## 2011-11-26 LAB — PATHOLOGIST SMEAR REVIEW

## 2011-11-26 LAB — PROTIME-INR
INR: 1.45 (ref 0.00–1.49)
Prothrombin Time: 17.9 seconds — ABNORMAL HIGH (ref 11.6–15.2)

## 2011-11-26 MED ORDER — SODIUM CHLORIDE 0.9 % IV BOLUS (SEPSIS)
500.0000 mL | Freq: Once | INTRAVENOUS | Status: AC
  Administered 2011-11-26: 500 mL via INTRAVENOUS

## 2011-11-26 MED ORDER — PIPERACILLIN-TAZOBACTAM 3.375 G IVPB
3.3750 g | Freq: Three times a day (TID) | INTRAVENOUS | Status: DC
  Administered 2011-11-26: 3.375 g via INTRAVENOUS
  Filled 2011-11-26 (×4): qty 50

## 2011-11-26 NOTE — Progress Notes (Signed)
CRITICAL VALUE ALERT  Critical value received: Platelet count of 28  Date of notification: 11/26/2011  Time of notification: 0815  Critical value read back: Yes  Nurse who received alert: Harold Barban, RN  MD notified (1st page): Dr. Dorise Hiss  Time of first page: [phone]  MD notified (2nd page):  Time of second page:  Responding MD: Dr. Gwenlyn Saran  Time MD responded: 0830

## 2011-11-26 NOTE — Consult Note (Signed)
Attending I have seen the patient  And reviewed the chart. Essentially 26 yo AAF  With hx of type 2 DM and nephrolithiasis admitted with fever, malaise and flank pain and suspected nephrolithiasis. CBC showed pancytopenia. I reviewed peripheral smear this am and it did show numerous abnormal myeloid forms, ie blasts. In addition to teardrop cells, and nRBC. She has been febrile off and on since admission and her antibiotics have been switched to zosyn. She is hemodynamically stable.She has no obvious purpuric areas in her mouth, even though she has noted gum bleeding. Her coags do not show obvious DIC, and minimally elevated PT/PTT. In view of the above, we have arranged transfer to [facility] for definitive therapy for presumed leukemia.I have discussed this with patient and family.  Pierce Crane MD

## 2011-11-26 NOTE — Consult Note (Signed)
[facility] CONSULTATION NOTE  Reason for Consult: Pancytopenia   Melissa Washington is a 26 y.o. female with a history DM 2 and HTN presenting with pyelonephritis following an episode of UTI on January of this year and most recently and 3 1013 at which she was given Macrobid. For the admission of 11/25/2011 initial treatment included IV antibiotics with Rocephin empirically.Labs on admission demonstrated pancytopenia, with a WBC 2.9. H/H 7.8 and 22.6, and a platelet count of 33,000. Her white count today is 3.0, with an H&H of 7.5 and 21.7 respectively, and the platelet count dropping to 28,000. Of note, she had noticed some gum bleeding when brushing teeth as well as count blood in urine for about 3-4 days. She denies any gross hematuria, melena or hematochezia. She denies any rashes or increased bruising. LMP was on 09/29/11, no clots.Negative pregnancy test.   She has had  Fever up to 103, no chills or night sweats. No weight loss. No recent trips or ticks. No family history of bleeding disorders. Of note, since 11/20/2011 however she increased the use of Motrin to 600 mg every 6 hours for the treatment of musculoskeletal pain especially in the right shoulder. She does not take aspirin on a regular basis. She has not received heparin products in the recent past.  Per tech  Review of her smear shows, elliptocytes,  polychromasia, teardrop cells and atypical  Mononuclear cells were seen. There overall presentation is suspicious for acute leukemia. Flow cytometry has been added today to the peripheral blood orders .No transfusions have been received to date. HIV is negative. Hepatitis panel is pending. Retic 1.5, T bili 0.9. A bone marrow biopsy was scheduled per interventional radiology on 11/27/2011. However, due to the acute clinical picture, plans have changed and she is to be transferred [facility] for continuation of care.  Hematology evaluation has been requested in the  interim.  PMH: Past Medical History  Diagnosis Date  . Diabetes mellitus   . Hypertension   . Nephrolithiasis     Sickle Cell trait   Known R breast cyst followed by PCP   Surgeries: No past surgical history on file.  Allergies: No Known Allergies  Medications:  Prior to Admission:  Prescriptions prior to admission  Medication Sig Dispense Refill  . ibuprofen (ADVIL,MOTRIN) 200 MG tablet Take 600 mg by mouth every 6 (six) hours as needed. For pain        NWG:[id number], acetaminophen, morphine, ondansetron (ZOFRAN) IV, ondansetron  ROS: Constitutional: She states she has had  weight loss, however, hospital record report stable weight since 2011 between 123 and 125.Positive  for fever up to 103 as above, no  chills and positive for  malaise/fatigue.  Eyes: Negative for blurred vision and double vision.  Respiratory: Negative for cough, hemoptysis and shortness of breath.  Cardiovascular: Negative for chest pain. GI: intermittent nausea and vomiting, diarrhea, constipation. No change in bowel caliber. No  Melena or hematochezia. Right flank and suprapubic pain. GU: No frank  blood in urine. No loss of urinary control. Skin: Negative for itching. No rash. No petechia. No bruising except in gums when brushing her teeth for the last 4 weeks Neurological: No headaches. No motor or sensory deficits.  Family History:  Family History  Problem Relation Age of Onset  . Kidney disease Mother   . Kidney disease Maternal Grandmother   . Diabetes Maternal Grandmother   Has a total of 10 siblings in good health.  Positive  history of Prostate Cancer in uncle. Grandaunt with brain Ca, Aunt with lung Ca.    Social History:  reports that she has never smoked. She does not have any smokeless tobacco history on file. She reports that she does not drink alcohol or use illicit drugs. Patient is engaged, has one small child in good health. 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  Physical  Exam  26 year old  in no acute distress A. and O. X3. Very tearful at this time General well-developed and well-nourished  HEENT: Normocephalic, atraumatic, PERRLA. Oral cavity without thrush or lesions. Neck supple. no thyromegaly, no cervical or supraclavicular adenopathy  Lungs clear bilaterally . No wheezing, rhonchi or rales. No axillary masses. Breasts: not examined. Cardiac regular rate and rhythm normal S1-S2, no murmur , rubs or gallops Abdomen  Obese, suprapubic tenderness. , bowel sounds x4. No HSM GU/rectal: deferred. Extremities no clubbing cyanosis or edema. No bruising except at the R  Antecubital venipuncture site.No  petechial rash     Labs:  CBC   Lab 11/26/11 1304 11/26/11 0700 11/25/11 1917  WBC -- 3.0* 2.9*  HGB -- 7.5* 7.8*  HCT -- 21.7* 22.6*  PLT 33* 28* 33*  MCV -- 88.2 87.9  MCH -- 30.5 30.4  MCHC -- 34.6 34.5  RDW -- 14.4 14.5  LYMPHSABS -- -- 1.9  MONOABS -- -- 0.6  EOSABS -- -- 0.0  BASOSABS -- -- 0.0  BANDABS -- -- --     Anemia panel:   Basename 11/26/11 0910  VITAMINB12 >2000*  FOLATE 13.1  FERRITIN --  TIBC 258  IRON 75  RETICCTPCT 1.5     CMP    Lab 11/26/11 0700 11/25/11 1917  NA 136 136  K 3.7 3.5  CL 103 101  CO2 23 23  GLUCOSE 151* 136*  BUN 9 11  CREATININE 0.75 0.70  CALCIUM 8.5 9.6  MG -- --  AST -- 16  ALT -- 25  ALKPHOS -- 57  BILITOT -- 0.9        Component Value Date/Time   BILITOT 0.9 11/25/2011 1917       Imaging Studies:  US Renal  11/26/2011  *RADIOLOGY REPORT*  Clinical Data: Left flank pain.  History of urinary tract calculi.  RENAL/URINARY TRACT ULTRASOUND COMPLETE 11/26/2011:  Comparison:  Unenhanced CT abdomen and pelvis 11/03/2008 [facility] and 12/31/2006 [facility].  Findings:  Right Kidney:  No hydronephrosis.  Well-preserved cortex.  No shadowing calculi.  Normal size and parenchymal echotexture without focal abnormalities.  Approximately 11.3 cm in length.  Left  Kidney:  No hydronephrosis.  Well-preserved cortex.  No shadowing calculi.  Normal size and parenchymal echotexture without focal abnormalities.  Approximately 13.5 cm in length.  Bladder:  Normal in appearance.  IMPRESSION: Normal urinary tract ultrasound.  Original Report Authenticated By: Arnell Sieving, M.D.        A/P: 26 y.o. 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 At this time, the flow cytometry has been ordered stat from peripheral blood. Her bone marrow biopsy planned or 11/27/2011 has now been canceled in preparation for transferring the patient to [facility] as soon as possible continuation of care. Dr.Rubin is to proceed with further recommendations  following this consult .  Thank you for the referral.  [facility] 11/26/2011 1:48 PM

## 2011-11-26 NOTE — H&P (Signed)
Agree 

## 2011-11-26 NOTE — Progress Notes (Signed)
UR completed 

## 2011-11-26 NOTE — Telephone Encounter (Signed)
Called patient's Melissa Washington per pt request. Notified them recent update that we are moving towards transfer to [facility] for treatment of likely leukemia. [name] has also notified patient's mother [name].   Lloyd Huger, MD [facility] Resident - PGY-2 11/26/2011 1:33 PM

## 2011-11-26 NOTE — Discharge Summary (Signed)
Physician Discharge Summary  Patient ID: Melissa Washington MRN: [id number] DOB/AGE: 13-Apr-1986 26 y.o.  Admit date: 11/25/2011 Discharge date: 11/26/2011  Admission Diagnoses: Pyelonephritis Hypertension Diabetes mellitus Type 2, A1c 7.7  Discharge Diagnoses:  Principal Problem:  *Pyelonephritis Active Problems:  DIABETES-TYPE 2  HYPERTENSION, BENIGN ESSENTIAL  Leukopenia Pancytopenia  Discharged Condition: stable  Hospital Course:  Patient is a 26 y.o. female with a history of diabetes and nephrolithiasis who presents with and 6-7 days left flank and abdominal pain and fevers up to 102. Was evaluated in the emergency department March 1 and found to have UTI, sent home with prescription for Macrobid. Patient did not fill this as she cannot afford it. Had worsening left flank, back and abdominal pain and continues to be febrile with some dysuria, nausea and general malaise. Has noted scant blood in urine with wiping. Has a previous history of nephrolithiasis which passed spontaneously. Family history of renal stones as well.  On present admission, patient was began on empiric rocephin treatment for pyelonephritis, however was subsequently found to have pancytopenia with white blood count 2.9 and ANC 400, hemoglobin 7.8, platelets 33. This represents a change within the last month. On January 18, patient noted to have isolated leukopenia of 2.3 however no differential was obtained. Also had hemoglobin 11.9 and platelets of 222 on 10/09/11. Notably patient had a UTI and was started on 5 day course of Bactrim after this lab draw on 1/20. Prior to this study, CBC were within normal limits last checked 05/2010.   Urine culture was obtained and patient was started empirically on Rocephin March 6. Had mild tachycardia which resolved with gentle fluid hydration and 500 cc bolus. Continued to be febrile up to 101.7. A renal ultrasound negative for obstruction and renal function within normal limits. Blood  cultures drawn on hospital day 2 and patient dosed with Zosyn. Hematology oncology consult, peripheral smear concerning for blasts and recommended transfer to [facility] for likely acute leukemia.     Consults: hematology/oncology  Significant Diagnostic Studies:  Urinalysis    Component Value Date/Time   COLORURINE YELLOW 11/20/2011 0418   APPEARANCEUR CLOUDY* 11/20/2011 0418   LABSPEC 1.013 11/20/2011 0418   PHURINE 5.5 11/20/2011 0418   GLUCOSEU NEGATIVE 11/20/2011 0418   HGBUR LARGE* 11/20/2011 0418   HGBUR moderate 01/29/2009 1517   BILIRUBINUR NEG 11/25/2011 1645   BILIRUBINUR NEGATIVE 11/20/2011 0418   KETONESUR NEGATIVE 11/20/2011 0418   PROTEINUR NEGATIVE 11/20/2011 0418   UROBILINOGEN 4.0 11/25/2011 1645   UROBILINOGEN 1.0 11/20/2011 0418   NITRITE POSITIVE 11/25/2011 1645   NITRITE POSITIVE* 11/20/2011 0418   LEUKOCYTESUR Trace 11/25/2011 1645   Results for orders placed during the hospital encounter of 11/25/11 (from the past 24 hour(s))  GRAM STAIN     Status: Normal   Collection Time   11/25/11  6:07 PM      Component Value Range   Specimen Description URINE, CLEAN CATCH     Special Requests Normal     Gram Stain       Value: CYTOSPIN PREP SLIDE     SQUAMOUS EPITHELIAL CELLS PRESENT     GRAM NEGATIVE RODS GRAM POSITIVE RODS     WBC PRESENT, PREDOMINANTLY PMN   Report Status 11/25/2011 FINAL    PREGNANCY, URINE     Status: Normal   Collection Time   11/25/11  6:07 PM      Component Value Range   Preg Test, Ur NEGATIVE  NEGATIVE   COMPREHENSIVE METABOLIC  PANEL     Status: Abnormal   Collection Time   11/25/11  7:17 PM      Component Value Range   Sodium 136  135 - 145 (mEq/L)   Potassium 3.5  3.5 - 5.1 (mEq/L)   Chloride 101  96 - 112 (mEq/L)   CO2 23  19 - 32 (mEq/L)   Glucose, Bld 136 (*) 70 - 99 (mg/dL)   BUN 11  6 - 23 (mg/dL)   Creatinine, Ser 4.09  0.50 - 1.10 (mg/dL)   Calcium 9.6  8.4 - 81.1 (mg/dL)   Total Protein 7.0  6.0 - 8.3 (g/dL)   Albumin 3.8  3.5 - 5.2 (g/dL)   AST  16  0 - 37 (U/L)   ALT 25  0 - 35 (U/L)   Alkaline Phosphatase 57  39 - 117 (U/L)   Total Bilirubin 0.9  0.3 - 1.2 (mg/dL)   GFR calc non Af Amer >90  >90 (mL/min)   GFR calc Af Amer >90  >90 (mL/min)  CBC     Status: Abnormal   Collection Time   11/25/11  7:17 PM      Component Value Range   WBC 2.9 (*) 4.0 - 10.5 (K/uL)   RBC 2.57 (*) 3.87 - 5.11 (MIL/uL)   Hemoglobin 7.8 (*) 12.0 - 15.0 (g/dL)   HCT 91.4 (*) 78.2 - 46.0 (%)   MCV 87.9  78.0 - 100.0 (fL)   MCH 30.4  26.0 - 34.0 (pg)   MCHC 34.5  30.0 - 36.0 (g/dL)   RDW 95.6  21.3 - 08.6 (%)   Platelets 33 (*) 150 - 400 (K/uL)  DIFFERENTIAL     Status: Abnormal   Collection Time   11/25/11  7:17 PM      Component Value Range   Neutrophils Relative 14 (*) 43 - 77 (%)   Lymphocytes Relative 64 (*) 12 - 46 (%)   Monocytes Relative 22 (*) 3 - 12 (%)   Eosinophils Relative 0  0 - 5 (%)   Basophils Relative 0  0 - 1 (%)   Band Neutrophils 0  0 - 10 (%)   Metamyelocytes Relative 0     Myelocytes 0     Promyelocytes Absolute 0     Blasts 0     nRBC 4 (*) 0 (/100 WBC)   Neutro Abs 0.4 (*) 1.7 - 7.7 (K/uL)   Lymphs Abs 1.9  0.7 - 4.0 (K/uL)   Monocytes Absolute 0.6  0.1 - 1.0 (K/uL)   Eosinophils Absolute 0.0  0.0 - 0.7 (K/uL)   Basophils Absolute 0.0  0.0 - 0.1 (K/uL)   RBC Morphology POLYCHROMASIA PRESENT    HEMOGLOBIN A1C     Status: Abnormal   Collection Time   11/25/11  7:17 PM      Component Value Range   Hemoglobin A1C 7.7 (*) <5.7 (%)   Mean Plasma Glucose 174 (*) <117 (mg/dL)  GLUCOSE, CAPILLARY     Status: Abnormal   Collection Time   11/25/11  9:47 PM      Component Value Range   Glucose-Capillary 171 (*) 70 - 99 (mg/dL)  BASIC METABOLIC PANEL     Status: Abnormal   Collection Time   11/26/11  7:00 AM      Component Value Range   Sodium 136  135 - 145 (mEq/L)   Potassium 3.7  3.5 - 5.1 (mEq/L)   Chloride 103  96 - 112 (mEq/L)  CO2 23  19 - 32 (mEq/L)   Glucose, Bld 151 (*) 70 - 99 (mg/dL)   BUN 9  6 - 23 (mg/dL)     Creatinine, Ser 1.61  0.50 - 1.10 (mg/dL)   Calcium 8.5  8.4 - 09.6 (mg/dL)   GFR calc non Af Amer >90  >90 (mL/min)   GFR calc Af Amer >90  >90 (mL/min)  CBC     Status: Abnormal   Collection Time   11/26/11  7:00 AM      Component Value Range   WBC 3.0 (*) 4.0 - 10.5 (K/uL)   RBC 2.46 (*) 3.87 - 5.11 (MIL/uL)   Hemoglobin 7.5 (*) 12.0 - 15.0 (g/dL)   HCT 04.5 (*) 40.9 - 46.0 (%)   MCV 88.2  78.0 - 100.0 (fL)   MCH 30.5  26.0 - 34.0 (pg)   MCHC 34.6  30.0 - 36.0 (g/dL)   RDW 81.1  91.4 - 78.2 (%)   Platelets 28 (*) 150 - 400 (K/uL)  PATHOLOGIST SMEAR REVIEW     Status: Normal   Collection Time   11/26/11  7:00 AM      Component Value Range   Tech Review PANCYTOPENIA WITH SEVERE    GLUCOSE, CAPILLARY     Status: Abnormal   Collection Time   11/26/11  8:05 AM      Component Value Range   Glucose-Capillary 151 (*) 70 - 99 (mg/dL)   Comment 1 Notify RN    TECHNOLOGIST SMEAR REVIEW     Status: Normal   Collection Time   11/26/11  9:10 AM      Component Value Range   Path Review ELLIPTOCYTES    VITAMIN B12     Status: Abnormal   Collection Time   11/26/11  9:10 AM      Component Value Range   Vitamin B-12 >2000 (*) 211 - 911 (pg/mL)  FOLATE     Status: Normal   Collection Time   11/26/11  9:10 AM      Component Value Range   Folate 13.1    IRON AND TIBC     Status: Normal   Collection Time   11/26/11  9:10 AM      Component Value Range   Iron 75  42 - 135 (ug/dL)   TIBC 956  213 - 086 (ug/dL)   Saturation Ratios 29  20 - 55 (%)   UIBC 183  125 - 400 (ug/dL)  FERRITIN     Status: Abnormal   Collection Time   11/26/11  9:10 AM      Component Value Range   Ferritin 1202 (*) 10 - 291 (ng/mL)  RETICULOCYTES     Status: Abnormal   Collection Time   11/26/11  9:10 AM      Component Value Range   Retic Ct Pct 1.5  0.4 - 3.1 (%)   RBC. 2.50 (*) 3.87 - 5.11 (MIL/uL)   Retic Count, Manual 37.5  19.0 - 186.0 (K/uL)  GLUCOSE, CAPILLARY     Status: Abnormal   Collection Time   11/26/11  12:02 PM      Component Value Range   Glucose-Capillary 167 (*) 70 - 99 (mg/dL)  APTT     Status: Normal   Collection Time   11/26/11 12:26 PM      Component Value Range   aPTT 35  24 - 37 (seconds)  PROTIME-INR     Status: Abnormal   Collection Time  11/26/11 12:26 PM      Component Value Range   Prothrombin Time 17.9 (*) 11.6 - 15.2 (seconds)   INR 1.45  0.00 - 1.49   DIC (DISSEMINATED INTRAVASCULAR COAGULATION) PANEL     Status: Abnormal   Collection Time   11/26/11  1:04 PM      Component Value Range   Prothrombin Time 18.6 (*) 11.6 - 15.2 (seconds)   INR 1.52 (*) 0.00 - 1.49    aPTT 37  24 - 37 (seconds)   Fibrinogen 276  204 - 475 (mg/dL)   D-Dimer, Quant >16.10 (*) 0.00 - 0.48 (ug/mL-FEU)   Platelets 33 (*) 150 - 400 (K/uL)   Smear Review NO SCHISTOCYTES SEEN        . influenza  inactive virus vaccine  0.5 mL Intramuscular Tomorrow-1000  . insulin aspart  0-15 Units Subcutaneous TID WC  . piperacillin-tazobactam (ZOSYN)  IV  3.375 g Intravenous Q8H  . sodium chloride  500 mL Intravenous Once  . sodium chloride  500 mL Intravenous Once  . DISCONTD: antiseptic oral rinse  15 mL Mouth Rinse q12n4p  . DISCONTD: cefTRIAXone (ROCEPHIN)  IV  1 g Intravenous Q24H  . DISCONTD: chlorhexidine  15 mL Mouth Rinse BID    Discharge Exam: Blood pressure 132/62, pulse 121, temperature 99.7 F (37.6 C), temperature source Oral, resp. rate 20, height 5\' 2"  (1.575 m), weight 280 lb 4.8 oz (127.143 kg), last menstrual period 09/29/2011, SpO2 97.00%. General appearance: alert, cooperative, no distress and appears fatigued Head: Normocephalic, without obvious abnormality, atraumatic Resp: clear to auscultation bilaterally and no respiratory distress Cardio: tachycardia. no mururs. GI: diffuse TTP, moderate tenderness in left flank and LLQ, suprapubic. BS+. Skin: bruising on arm. No petechia noted. Neurologic: Alert and oriented X 3, normal strength and tone. Normal symmetric reflexes.  Normal coordination and gait  Disposition: [facility] 907.  Discharge Orders    Future Appointments: Provider: Department: Dept Phone: Center:   11/27/2011 9:00 AM [facility]  [facility]        Signed: Lloyd Huger, MD [facility] Resident - PGY-2 11/26/2011 3:14 PM

## 2011-11-26 NOTE — Progress Notes (Signed)
Brief Nutrition Note:  Chart reviewed and patient's status noted.   Patient triggers for unintentional weight loss on admission screen.  Upon review of records, admission wt is 127 kg (11/25/11), weight in January was 126.5 kg, weight one year ago was 125.5 kg (March 2012), and weight two years ago was 123.6 kg (March 2011).    Tolerated Carb Mod Medium diet for breakfast.  No nutrition intervention at this time.  Sanjuan Dame, [employer] Pager [phone]

## 2011-11-26 NOTE — H&P (Signed)
Melissa Washington is an 26 y.o. female.   Chief Complaint: pancytopenia; DM; HTN HPI: scheduled for Bone Marrow biopsy 3/8 in IR  Past Medical History  Diagnosis Date  . Diabetes mellitus   . Hypertension   . Nephrolithiasis     No past surgical history on file.  Family History  Problem Relation Age of Onset  . Kidney disease Mother   . Kidney disease Maternal Grandmother   . Diabetes Maternal Grandmother    Social History:  reports that she has never smoked. She does not have any smokeless tobacco history on file. She reports that she does not drink alcohol or use illicit drugs.  Allergies: No Known Allergies  Medications Prior to Admission  Medication Dose Route Frequency Provider Last Rate Last Dose  . 0.9 %  sodium chloride infusion   Intravenous Continuous Priscella Mann, MD 125 mL/hr at 11/25/11 2351    . acetaminophen (TYLENOL) tablet 650 mg  650 mg Oral Q6H PRN Lloyd Huger, MD   650 mg at 11/26/11 0229   Or  . acetaminophen (TYLENOL) suppository 650 mg  650 mg Rectal Q6H PRN Lloyd Huger, MD      . cefTRIAXone (ROCEPHIN) 1 g in dextrose 5 % 50 mL IVPB  1 g Intravenous Q24H Lloyd Huger, MD   1 g at 11/25/11 1902  . influenza  inactive virus vaccine (FLUZONE/FLUARIX) injection 0.5 mL  0.5 mL Intramuscular Tomorrow-1000 Denny Levy, MD   0.5 mL at 11/26/11 0939  . insulin aspart (novoLOG) injection 0-15 Units  0-15 Units Subcutaneous TID WC Lloyd Huger, MD   3 Units at 11/26/11 0940  . morphine 2 MG/ML injection 2 mg  2 mg Intravenous Q3H PRN Lloyd Huger, MD   2 mg at 11/26/11 0806  . ondansetron (ZOFRAN) tablet 4 mg  4 mg Oral Q6H PRN Lloyd Huger, MD       Or  . ondansetron [facility]) injection 4 mg  4 mg Intravenous Q6H PRN Lloyd Huger, MD   4 mg at 11/25/11 1902  . sodium chloride 0.9 % bolus 500 mL  500 mL Intravenous Once Lloyd Huger, MD   500 mL at 11/25/11 1824  . DISCONTD: antiseptic oral rinse (BIOTENE) solution 15 mL  15 mL Mouth Rinse q12n4p Denny Levy, MD   15 mL at  11/25/11 2055  . DISCONTD: chlorhexidine (PERIDEX) 0.12 % solution 15 mL  15 mL Mouth Rinse BID Denny Levy, MD   15 mL at 11/25/11 2100   No current outpatient prescriptions on file as of 11/26/2011.    Results for orders placed during the hospital encounter of 11/25/11 (from the past 48 hour(s))  GRAM STAIN     Status: Normal   Collection Time   11/25/11  6:07 PM      Component Value Range Comment   Specimen Description URINE, CLEAN CATCH      Special Requests Normal      Gram Stain        Value: CYTOSPIN PREP SLIDE     SQUAMOUS EPITHELIAL CELLS PRESENT     GRAM NEGATIVE RODS GRAM POSITIVE RODS     WBC PRESENT, PREDOMINANTLY PMN   Report Status 11/25/2011 FINAL     PREGNANCY, URINE     Status: Normal   Collection Time   11/25/11  6:07 PM      Component Value Range Comment   Preg Test, Ur NEGATIVE  NEGATIVE    COMPREHENSIVE METABOLIC PANEL     Status:  Abnormal   Collection Time   11/25/11  7:17 PM      Component Value Range Comment   Sodium 136  135 - 145 (mEq/L)    Potassium 3.5  3.5 - 5.1 (mEq/L)    Chloride 101  96 - 112 (mEq/L)    CO2 23  19 - 32 (mEq/L)    Glucose, Bld 136 (*) 70 - 99 (mg/dL)    BUN 11  6 - 23 (mg/dL)    Creatinine, Ser 8.11  0.50 - 1.10 (mg/dL)    Calcium 9.6  8.4 - 10.5 (mg/dL)    Total Protein 7.0  6.0 - 8.3 (g/dL)    Albumin 3.8  3.5 - 5.2 (g/dL)    AST 16  0 - 37 (U/L)    ALT 25  0 - 35 (U/L)    Alkaline Phosphatase 57  39 - 117 (U/L)    Total Bilirubin 0.9  0.3 - 1.2 (mg/dL)    GFR calc non Af Amer >90  >90 (mL/min)    GFR calc Af Amer >90  >90 (mL/min)   CBC     Status: Abnormal   Collection Time   11/25/11  7:17 PM      Component Value Range Comment   WBC 2.9 (*) 4.0 - 10.5 (K/uL)    RBC 2.57 (*) 3.87 - 5.11 (MIL/uL)    Hemoglobin 7.8 (*) 12.0 - 15.0 (g/dL)    HCT 91.4 (*) 78.2 - 46.0 (%)    MCV 87.9  78.0 - 100.0 (fL)    MCH 30.4  26.0 - 34.0 (pg)    MCHC 34.5  30.0 - 36.0 (g/dL)    RDW 95.6  21.3 - 08.6 (%)    Platelets 33 (*) 150 - 400  (K/uL) PLATELET COUNT CONFIRMED BY SMEAR  DIFFERENTIAL     Status: Abnormal   Collection Time   11/25/11  7:17 PM      Component Value Range Comment   Neutrophils Relative 14 (*) 43 - 77 (%)    Lymphocytes Relative 64 (*) 12 - 46 (%)    Monocytes Relative 22 (*) 3 - 12 (%)    Eosinophils Relative 0  0 - 5 (%)    Basophils Relative 0  0 - 1 (%)    Band Neutrophils 0  0 - 10 (%)    Metamyelocytes Relative 0      Myelocytes 0      Promyelocytes Absolute 0      Blasts 0      nRBC 4 (*) 0 (/100 WBC)    Neutro Abs 0.4 (*) 1.7 - 7.7 (K/uL)    Lymphs Abs 1.9  0.7 - 4.0 (K/uL)    Monocytes Absolute 0.6  0.1 - 1.0 (K/uL)    Eosinophils Absolute 0.0  0.0 - 0.7 (K/uL)    Basophils Absolute 0.0  0.0 - 0.1 (K/uL)    RBC Morphology POLYCHROMASIA PRESENT     HEMOGLOBIN A1C     Status: Abnormal   Collection Time   11/25/11  7:17 PM      Component Value Range Comment   Hemoglobin A1C 7.7 (*) <5.7 (%)    Mean Plasma Glucose 174 (*) <117 (mg/dL)   GLUCOSE, CAPILLARY     Status: Abnormal   Collection Time   11/25/11  9:47 PM      Component Value Range Comment   Glucose-Capillary 171 (*) 70 - 99 (mg/dL)   BASIC METABOLIC PANEL     Status:  Abnormal   Collection Time   11/26/11  7:00 AM      Component Value Range Comment   Sodium 136  135 - 145 (mEq/L)    Potassium 3.7  3.5 - 5.1 (mEq/L)    Chloride 103  96 - 112 (mEq/L)    CO2 23  19 - 32 (mEq/L)    Glucose, Bld 151 (*) 70 - 99 (mg/dL)    BUN 9  6 - 23 (mg/dL)    Creatinine, Ser 1.61  0.50 - 1.10 (mg/dL)    Calcium 8.5  8.4 - 10.5 (mg/dL)    GFR calc non Af Amer >90  >90 (mL/min)    GFR calc Af Amer >90  >90 (mL/min)   CBC     Status: Abnormal   Collection Time   11/26/11  7:00 AM      Component Value Range Comment   WBC 3.0 (*) 4.0 - 10.5 (K/uL)    RBC 2.46 (*) 3.87 - 5.11 (MIL/uL)    Hemoglobin 7.5 (*) 12.0 - 15.0 (g/dL)    HCT 09.6 (*) 04.5 - 46.0 (%)    MCV 88.2  78.0 - 100.0 (fL)    MCH 30.5  26.0 - 34.0 (pg)    MCHC 34.6  30.0 - 36.0  (g/dL)    RDW 40.9  81.1 - 91.4 (%)    Platelets 28 (*) 150 - 400 (K/uL)   PATHOLOGIST SMEAR REVIEW     Status: Normal   Collection Time   11/26/11  7:00 AM      Component Value Range Comment   Tech Review PANCYTOPENIA WITH SEVERE     GLUCOSE, CAPILLARY     Status: Abnormal   Collection Time   11/26/11  8:05 AM      Component Value Range Comment   Glucose-Capillary 151 (*) 70 - 99 (mg/dL)    Comment 1 Notify RN     TECHNOLOGIST SMEAR REVIEW     Status: Normal   Collection Time   11/26/11  9:10 AM      Component Value Range Comment   Path Review ELLIPTOCYTES     RETICULOCYTES     Status: Abnormal   Collection Time   11/26/11  9:10 AM      Component Value Range Comment   Retic Ct Pct 1.5  0.4 - 3.1 (%)    RBC. 2.50 (*) 3.87 - 5.11 (MIL/uL)    Retic Count, Manual 37.5  19.0 - 186.0 (K/uL)    No results found.  Review of Systems  Constitutional: Positive for fever.  Cardiovascular: Negative for chest pain.  Gastrointestinal: Negative for nausea and vomiting.  Neurological: Negative for headaches.    Blood pressure 100/45, pulse 94, temperature 101.4 F (38.6 C), temperature source Oral, resp. rate 18, height 5\' 2"  (1.575 m), weight 280 lb 4.8 oz (127.143 kg), last menstrual period 09/29/2011, SpO2 97.00%. Physical Exam  Constitutional: She is oriented to person, place, and time.  Cardiovascular: Normal rate and normal heart sounds.   No murmur heard. Respiratory: Effort normal. She has no wheezes.  GI: Soft. There is no tenderness.  Neurological: She is alert and oriented to person, place, and time.     Assessment/Plan Pancytopenia Scheduled for BM bx in IR 3/8 Pt aware of procedure benefits and risks and agreeable to proceed. Consent signed and in chart  Tomi Grandpre A 11/26/2011, 11:25 AM

## 2011-11-26 NOTE — Progress Notes (Signed)
Subjective: No acute events overnight. Patient's has improved to 7-10. Patient feels nauseous, but is able to eat and has had episodes of emesis. Patient endorses that her gums have been swollen and bleeding over last few weeks during and independent of the brushing of teeth. No CP/SOB/Rash. No pain on urination.  Objective: Vital signs in last 24 hours: Temp:  [98.5 F (36.9 C)-100.4 F (38 C)] 98.6 F (37 C) (03/07 0525) Pulse Rate:  [90-108] 94  (03/07 0525) Resp:  [17-18] 18  (03/07 0525) BP: (96-140)/(45-89) 100/45 mmHg (03/07 0525) SpO2:  [97 %-99 %] 97 % (03/07 0525) Weight:  [280 lb 4.8 oz (127.143 kg)] 280 lb 4.8 oz (127.143 kg) (03/07 0700) Weight change:  Last BM Date: 11/24/11  Intake/Output from previous day: 03/06 0701 - 03/07 0700 In: 1758 [I.V.:1758] Out: 901 [Urine:901] Intake/Output this shift:    General appearance: NAD, eating breakfast comfortably in bed Resp: clear to auscultation bilaterally Cardio: regular rate and rhythm, S1, S2 normal, no murmur, click, rub or gallop GI: Soft, obese, non distended abdomen, +BS, moderate supra pubic tenderness and left CVA tenderness, Pulses: 2+ and symmetric Neurologic: Grossly normal  Lab Results:  Basename 11/26/11 0700 11/25/11 1917  WBC 3.0* 2.9*  HGB 7.5* 7.8*  HCT 21.7* 22.6*  PLT 28* 33*   BMET  Basename 11/26/11 0700 11/25/11 1917  NA 136 136  K 3.7 3.5  CL 103 101  CO2 23 23  GLUCOSE 151* 136*  BUN 9 11  CREATININE 0.75 0.70  CALCIUM 8.5 9.6   Urine gram stain - GNR and GPR   Studies/Results: No results found.  Medications: I have reviewed the patient's current medications.  Assessment/Plan: 26 year old with diabetes type 2 and HTN with and PMH significant for nephrolithiasis and pyelonephritis presenting with pyelonephritis and presumed nephrolithiasis.   1. Pyelonephritis - Recent Dx of UTI (09/24/2011) untreated - UA - +leuks, +nitrites, +bact, +WBC, +RBC - Vitals stable. Temp of 100.3  last night afebrile currently. - Urine gram stain - GNR and GPR , culture pending - Empiric ABX coverage with Rocephin - Renal U/S ordered for evaluation of nephrolithiasis, obstruction, or hydronephrosis - Morphine 2mg  q 3 hours and tylenol for pain control   2. Pancytopenia - CBC - WBC 3.0, RBC 7.5, PLT 28 - Patient nml CBC in 11/12 > isolated leukopenia in 01/13 > pancytopenia in 3/13 - Ordered Hepatitis Panel, HIV, anemia panel, peripheral smear - ID consulted - concerned for leukemia - Bone marrow biopsy ordered per ID recs - Appreciate ID thoughts    3. Diabetes - HgbA1c - 7.7, CBG mid 100s - holding oral medications and start sliding scale insulin.   4. Hypertension - BP within goal - holding ACEi  5. FEN/GI - Carb controled diet - NS @ 125cc/hr  6. PPX - SCDs  7. Dispo. Pending clinical work-up  LOS: 1 day   Tussey, Shane 11/26/2011, 9:45 AM   I have seen and examined patient with MS4, agree with above. 26 year old with pyelonephritis and possible nephrolithiasis. Tachycardia improved with IV fluids and pain is controlled with morphine currently. IV Rocephin pending cultures. Patient noted to have new pancytopenia on admission with a noted isolated leukopenia on CBC in October 09 2011. Notably patient was found to have UTI and prescribed Bactrim 5 day course starting 1/21 but patient is unsure if she took this medication as she is poor historian. Uncertain if this could be a drug effect, but will consult Hematology for  direction.   Lloyd Huger, MD [facility] Resident - PGY-2 11/26/2011 10:29 AM

## 2011-11-26 NOTE — Progress Notes (Signed)
ANTIBIOTIC CONSULT NOTE - INITIAL  Pharmacy Consult for Zosyn Indication: Pyelonephritis  No Known Allergies  Patient Measurements: Height: 5\' 2"  (157.5 cm) Weight: 280 lb 4.8 oz (127.143 kg) IBW/kg (Calculated) : 50.1  Adjusted Body Weight: 73 kg  Vital Signs: Temp: 101.4 F (38.6 C) (03/07 1052) Temp src: Oral (03/07 1052) BP: 100/45 mmHg (03/07 0525) Pulse Rate: 94  (03/07 0525) Intake/Output from previous day: 03/06 0701 - 03/07 0700 In: 1758 [I.V.:1758] Out: 901 [Urine:901] Intake/Output from this shift: Total I/O In: 100 [P.O.:100] Out: -   Labs:  Basename 11/26/11 0700 11/25/11 1917  WBC 3.0* 2.9*  HGB 7.5* 7.8*  PLT 28* 33*  LABCREA -- --  CREATININE 0.75 0.70   Estimated Creatinine Clearance: 139.7 ml/min (by C-G formula based on Cr of 0.75). No results found for this basename: VANCOTROUGH:2,VANCOPEAK:2,VANCORANDOM:2,GENTTROUGH:2,GENTPEAK:2,GENTRANDOM:2,TOBRATROUGH:2,TOBRAPEAK:2,TOBRARND:2,AMIKACINPEAK:2,AMIKACINTROU:2,AMIKACIN:2, in the last 72 hours   Microbiology: Recent Results (from the past 720 hour(s))  GRAM STAIN     Status: Normal   Collection Time   11/25/11  6:07 PM      Component Value Range Status Comment   Specimen Description URINE, CLEAN CATCH   Final    Special Requests Normal   Final    Gram Stain     Final    Value: CYTOSPIN PREP SLIDE     SQUAMOUS EPITHELIAL CELLS PRESENT     GRAM NEGATIVE RODS GRAM POSITIVE RODS     WBC PRESENT, PREDOMINANTLY PMN   Report Status 11/25/2011 FINAL   Final   TECHNOLOGIST SMEAR REVIEW     Status: Normal   Collection Time   11/26/11  9:10 AM      Component Value Range Status Comment   Path Review ELLIPTOCYTES   Final     Medical History: Past Medical History  Diagnosis Date  . Diabetes mellitus   . Hypertension   . Nephrolithiasis     Medications:  Prescriptions prior to admission  Medication Sig Dispense Refill  . ibuprofen (ADVIL,MOTRIN) 200 MG tablet Take 600 mg by mouth every 6 (six)  hours as needed. For pain       Assessment: 26 y.o. female presents with fever, abd pain. Started on rocephin for pyelonephritis. To broaden coverage today to Zosyn as pt with pancytopenia. Urine cultures pending. Pt with excellent renal function. Noted probable plan to transfer to [facility] for treatment of likely leukemia.  Goal of Therapy:  Eradication of infection  Plan:  1. Zosyn 3.375gm IV q8h. Each dose over 4 hours. 2. Will f/u cultures and renal function  Neshawn Aird, Hilario Quarry, PharmD, BCPS Clinical pharmacist, Pager [phone] 11/26/2011,1:31 PM

## 2011-11-26 NOTE — Progress Notes (Signed)
Patient transported to [facility] via [facility]. Report called to Waynetta Sandy, Charity fundraiser at [employer]. Report given to [facility] RN. IV infusing with IVF and IV antibiotics per MD order. Family updated on patient's status.

## 2011-11-27 ENCOUNTER — Other Ambulatory Visit (HOSPITAL_COMMUNITY): Payer: Self-pay

## 2011-11-27 ENCOUNTER — Encounter: Payer: Self-pay | Admitting: Family Medicine

## 2011-11-27 LAB — HEPATITIS PANEL, ACUTE
Hep B C IgM: NEGATIVE
Hepatitis B Surface Ag: NEGATIVE

## 2011-11-27 NOTE — Discharge Summary (Signed)
Family Medicine Teaching Service  Discharge Note : Attending Denny Levy MD Pager [phone] Office [phone] I have seen and examined this patient, reviewed their chart and discussed discharge planning wit the resident at the time of discharge. I agree with the discharge plan as above. Admitted with flank pain suspicious for pyelonephritis with hx of nephrolithiasis, was found to be pancytopenic. 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 He recommended transfer to [facility] as [facility] typically does not treat acute leukemia. Patient was transferred without incident. I spoke directly with the patient and we called her family and spoke with them, letting all involved know this was potentially leukemia. We will see her for continuity care upon discharge and I confirmed that with her. Her assigned primary care provider is Dr Rivka Safer.

## 2011-11-28 LAB — URINE CULTURE: Culture  Setup Time: 201303070205

## 2011-12-02 LAB — CULTURE, BLOOD (ROUTINE X 2): Culture: NO GROWTH

## 2011-12-27 ENCOUNTER — Emergency Department (HOSPITAL_COMMUNITY)
Admission: EM | Admit: 2011-12-27 | Discharge: 2011-12-27 | Disposition: A | Discharge status: Home / Self Care | Payer: Medicaid Other | Attending: Emergency Medicine | Admitting: Emergency Medicine

## 2011-12-27 ENCOUNTER — Encounter (HOSPITAL_COMMUNITY): Payer: Self-pay | Admitting: Emergency Medicine

## 2011-12-27 DIAGNOSIS — N898 Other specified noninflammatory disorders of vagina: Secondary | ICD-10-CM | POA: Insufficient documentation

## 2011-12-27 DIAGNOSIS — Z79899 Other long term (current) drug therapy: Secondary | ICD-10-CM | POA: Insufficient documentation

## 2011-12-27 DIAGNOSIS — R109 Unspecified abdominal pain: Secondary | ICD-10-CM | POA: Insufficient documentation

## 2011-12-27 DIAGNOSIS — C9591 Leukemia, unspecified, in remission: Secondary | ICD-10-CM | POA: Insufficient documentation

## 2011-12-27 DIAGNOSIS — I10 Essential (primary) hypertension: Secondary | ICD-10-CM | POA: Insufficient documentation

## 2011-12-27 DIAGNOSIS — N938 Other specified abnormal uterine and vaginal bleeding: Secondary | ICD-10-CM | POA: Insufficient documentation

## 2011-12-27 DIAGNOSIS — E119 Type 2 diabetes mellitus without complications: Secondary | ICD-10-CM | POA: Insufficient documentation

## 2011-12-27 DIAGNOSIS — N949 Unspecified condition associated with female genital organs and menstrual cycle: Secondary | ICD-10-CM | POA: Insufficient documentation

## 2011-12-27 LAB — URINALYSIS, ROUTINE W REFLEX MICROSCOPIC
Bilirubin Urine: NEGATIVE
Glucose, UA: NEGATIVE mg/dL
Ketones, ur: NEGATIVE mg/dL
Nitrite: NEGATIVE
Specific Gravity, Urine: 1.017 (ref 1.005–1.030)
pH: 6 (ref 5.0–8.0)

## 2011-12-27 LAB — URINE MICROSCOPIC-ADD ON

## 2011-12-27 LAB — BASIC METABOLIC PANEL
BUN: 15 mg/dL (ref 6–23)
CO2: 22 mEq/L (ref 19–32)
Glucose, Bld: 113 mg/dL — ABNORMAL HIGH (ref 70–99)
Potassium: 3.9 mEq/L (ref 3.5–5.1)
Sodium: 140 mEq/L (ref 135–145)

## 2011-12-27 LAB — CBC
HCT: 31.6 % — ABNORMAL LOW (ref 36.0–46.0)
Hemoglobin: 10.6 g/dL — ABNORMAL LOW (ref 12.0–15.0)
RBC: 3.52 MIL/uL — ABNORMAL LOW (ref 3.87–5.11)

## 2011-12-27 LAB — PREGNANCY, URINE: Preg Test, Ur: NEGATIVE

## 2011-12-27 MED ORDER — MEDROXYPROGESTERONE ACETATE 10 MG PO TABS: 10.0000 mg | ORAL_TABLET | Freq: Every day | ORAL | Status: DC

## 2011-12-27 MED ORDER — OXYCODONE-ACETAMINOPHEN 5-325 MG PO TABS
2.0000 | ORAL_TABLET | Freq: Once | ORAL | Status: AC
  Administered 2011-12-27: 1 via ORAL
  Filled 2011-12-27: qty 2

## 2011-12-27 MED ORDER — OXYCODONE-ACETAMINOPHEN 5-325 MG PO TABS: 1.0000 | ORAL_TABLET | ORAL | Status: AC | PRN

## 2011-12-27 NOTE — ED Notes (Signed)
Pt and family member still having concerns about the vaginal bleeding. Campos MD notified and is in speaking with pt and family now.

## 2011-12-27 NOTE — ED Notes (Signed)
Patient is alert and oriented x3.  She was given DC instructions and follow up visit instructions.  Patient gave verbal understanding. She was DC ambulatory under his own power to home.  V/S stable.  He was not showing any signs of distress on DC 

## 2011-12-27 NOTE — Discharge Instructions (Signed)
Abnormal Uterine Bleeding Abnormal uterine bleeding can have many causes. Some cases are simply treated, while others are more serious. There are several kinds of bleeding that is considered abnormal, including:  Bleeding between periods.   Bleeding after sexual intercourse.   Spotting anytime in the menstrual cycle.   Bleeding heavier or more than normal.   Bleeding after menopause.  CAUSES  There are many causes of abnormal uterine bleeding. It can be present in teenagers, pregnant women, women during their reproductive years, and women who have reached menopause. Your caregiver will look for the more common causes depending on your age, signs, symptoms and your particular circumstance. Most cases are not serious and can be treated. Even the more serious causes, like cancer of the female organs, can be treated adequately if found in the early stages. That is why all types of bleeding should be evaluated and treated as soon as possible. DIAGNOSIS  Diagnosing the cause may take several kinds of tests. Your caregiver may:  Take a complete history of the type of bleeding.   Perform a complete physical exam and Pap smear.   Take an ultrasound on the abdomen showing a picture of the female organs and the pelvis.   Inject dye into the uterus and Fallopian tubes and X-ray them (hysterosalpingogram).   Place fluid in the uterus and do an ultrasound (sonohysterogrqphy).   Take a CT scan to examine the female organs and pelvis.   Take an MRI to examine the female organs and pelvis. There is no X-ray involved with this procedure.   Look inside the uterus with a telescope that has a light at the end (hysteroscopy).   Scrap the inside of the uterus to get tissue to examine (Dilatation and Curettage, D&C).   Look into the pelvis with a telescope that has a light at the end (laparoscopy). This is done through a very small cut (incision) in the abdomen.  TREATMENT  Treatment will depend on the  cause of the abnormal bleeding. It can include:  Doing nothing to allow the problem to take care of itself over time.   Hormone treatment.   Birth control pills.   Treating the medical condition causing the problem.   Laparoscopy.   Major or minor surgery   Destroying the lining of the uterus with electrical currant, laser, freezing or heat (uterine ablation).  HOME CARE INSTRUCTIONS   Follow your caregiver's recommendation on how to treat your problem.   See your caregiver if you missed a menstrual period and think you may be pregnant.   If you are bleeding heavily, count the number of pads/tampons you use and how often you have to change them. Tell this to your caregiver.   Avoid sexual intercourse until the problem is controlled.  SEEK MEDICAL CARE IF:   You have any kind of abnormal bleeding mentioned above.   You feel dizzy at times.   You are 26 years old and have not had a menstrual period yet.  SEEK IMMEDIATE MEDICAL CARE IF:   You pass out.   You are changing pads/tampons every 15 to 30 minutes.   You have belly (abdominal) pain.   You have a temperature of 100 F (37.8 C) or higher.   You become sweaty or weak.   You are passing large blood clots from the vagina.   You start to feel sick to your stomach (nauseous) and throw up (vomit).  Document Released: 09/07/2005 Document Revised: 08/27/2011 Document Reviewed: 01/31/2009 [employer]   Patient Information 2012 [employer]. 

## 2011-12-27 NOTE — ED Notes (Signed)
Pt was dx with leukemia about 1 month ago. Was treated at [facility] and given "remission" status at d/c on 12-24-11. Pt was unable to get her oral BC pills and began heavy vaginal bleeding yesterday and presents to this ER for heavy vag bleeding and uterine cramping.

## 2011-12-27 NOTE — ED Provider Notes (Signed)
History     CSN: [id number]  Arrival date & time 12/27/11  1009   First MD Initiated Contact with Patient 12/27/11 1037      Chief Complaint  Patient presents with  . Vaginal Bleeding    heavy bleeding and painful cramps     The history is provided by the patient and the spouse.  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 Now with vaginal bleeding that has been "heavy" with some clots and uterine cramping. Irregular menstrual cycles at baseline. No fever or chills. No SOB. No lightheadedness or syncope or presyncope. No CP. No anticoagulants. Nothing worsens or improves the symptoms. Symptoms are mild-moderate   Past Medical History  Diagnosis Date  . Diabetes mellitus   . Hypertension   . Nephrolithiasis   . Leukemia     in remission 12-24-11    History reviewed. No pertinent past surgical history.  Family History  Problem Relation Age of Onset  . Kidney disease Mother   . Kidney disease Maternal Grandmother   . Diabetes Maternal Grandmother     History  Substance Use Topics  . Smoking status: Never Smoker   . Smokeless tobacco: Not on file  . Alcohol Use: No    OB History    Grav Para Term Preterm Abortions TAB SAB Ect Mult Living                  Review of Systems  All other systems reviewed and are negative.    Allergies  Review of patient's allergies indicates no known allergies.  Home Medications   Current Outpatient Rx  Name Route Sig Dispense Refill  . ASPIRIN 325 MG PO TABS Oral Take 325 mg by mouth daily as needed. For pain    . MEDROXYPROGESTERONE ACETATE 10 MG PO TABS Oral Take 1 tablet (10 mg total) by mouth daily. 10 tablet 0    BP 130/84  Pulse 65  Temp(Src) 98.8 F (37.1 C) (Oral)  Resp 18  SpO2 99%  Physical Exam  Nursing note and vitals reviewed. Constitutional: She is oriented to person, place, and time. She appears well-developed and well-nourished. No distress.  HENT:  Head: Normocephalic and atraumatic.  Eyes:  EOM are normal.  Neck: Normal range of motion.  Cardiovascular: Normal rate, regular rhythm and normal heart sounds.   Pulmonary/Chest: Effort normal and breath sounds normal.  Abdominal: Soft. She exhibits no distension. There is no tenderness. There is no rebound and no guarding.  Genitourinary:       Normal external genitalia. Small vaginal bleeding from cervix with some clots in vagina.   Musculoskeletal: Normal range of motion.  Neurological: She is alert and oriented to person, place, and time.  Skin: Skin is warm and dry.  Psychiatric: She has a normal mood and affect. Judgment normal.    ED Course  Procedures (including critical care time)  Labs Reviewed  URINALYSIS, ROUTINE W REFLEX MICROSCOPIC - Abnormal; Notable for the following:    APPearance CLOUDY (*)    Hgb urine dipstick LARGE (*)    Leukocytes, UA SMALL (*)    All other components within normal limits  CBC - Abnormal; Notable for the following:    WBC 2.9 (*) WHITE COUNT CONFIRMED ON SMEAR   RBC 3.52 (*)    Hemoglobin 10.6 (*)    HCT 31.6 (*)    RDW 17.3 (*)    Platelets 502 (*)    All other components within normal limits  BASIC  METABOLIC PANEL - Abnormal; Notable for the following:    Glucose, Bld 113 (*)    All other components within normal limits  URINE MICROSCOPIC-ADD ON - Abnormal; Notable for the following:    Squamous Epithelial / LPF FEW (*)    Bacteria, UA MANY (*)    All other components within normal limits  WET PREP, GENITAL - Abnormal; Notable for the following:    WBC, Wet Prep HPF POC FEW (*)    All other components within normal limits  PREGNANCY, URINE  GC/CHLAMYDIA PROBE AMP, GENITAL   No results found.   1. Dysfunctional uterine bleeding       MDM  The patient's hemoglobin and platelets look good today.  Close followup with her doctors.  She sees Dr. Lowell Guitar at [facility]. Suspect dysfunctional uterine bleeding . All questions answered. Will place on 10 day course of  progesterone. Understands to return to ER for new or worsening symptoms        Lyanne Co, MD 12/27/11 2349

## 2011-12-28 LAB — GC/CHLAMYDIA PROBE AMP, GENITAL
Chlamydia, DNA Probe: NEGATIVE
GC Probe Amp, Genital: NEGATIVE

## 2012-01-01 ENCOUNTER — Telehealth: Payer: Self-pay | Admitting: *Deleted

## 2012-01-01 ENCOUNTER — Other Ambulatory Visit: Payer: Self-pay | Admitting: Oncology

## 2012-01-01 DIAGNOSIS — C9240 Acute promyelocytic leukemia, not having achieved remission: Secondary | ICD-10-CM

## 2012-01-01 NOTE — Telephone Encounter (Signed)
Rec'd call from patient today, she was at a friends home calling in, she currently does not have a phone and wants to know when she is to see Dr Donnie Coffin. Triage spoke with Dr Donnie Coffin while patient was on the line and appt has been made for Monday at 1000am. Ordered and chart sent to be scheduled in system.

## 2012-01-01 NOTE — Telephone Encounter (Signed)
SPOKE WITH DR.RUBIN. PT. WILL COME TO THIS OFFICE ON Monday 01/04/12 AT 10:00AM. NOTIFIED LAUREN MULLIS, CLINICAL SOCIAL WORKER. SHE WILL MEET WITH PT. TO ASSESS HER NEEDS INCLUDING THE NEED FOR A PHONE. NOTIFIED PT. SHE VOICES UNDERSTANDING.

## 2012-01-01 NOTE — Telephone Encounter (Signed)
per  conversation with Laroy Apple placed patient on 01-04-2012 told to double book patient for 10:30am

## 2012-01-04 ENCOUNTER — Encounter: Payer: Self-pay | Admitting: Oncology

## 2012-01-04 ENCOUNTER — Ambulatory Visit: Payer: Self-pay | Admitting: Oncology

## 2012-01-04 ENCOUNTER — Other Ambulatory Visit: Payer: Self-pay | Admitting: Oncology

## 2012-01-04 ENCOUNTER — Telehealth: Payer: Self-pay | Admitting: *Deleted

## 2012-01-04 ENCOUNTER — Ambulatory Visit: Payer: Medicaid Other

## 2012-01-04 ENCOUNTER — Other Ambulatory Visit: Payer: Self-pay

## 2012-01-04 ENCOUNTER — Ambulatory Visit (HOSPITAL_BASED_OUTPATIENT_CLINIC_OR_DEPARTMENT_OTHER): Payer: Self-pay | Admitting: Oncology

## 2012-01-04 ENCOUNTER — Encounter: Payer: Self-pay | Admitting: *Deleted

## 2012-01-04 ENCOUNTER — Ambulatory Visit (HOSPITAL_BASED_OUTPATIENT_CLINIC_OR_DEPARTMENT_OTHER): Payer: Self-pay | Admitting: Lab

## 2012-01-04 VITALS — BP 135/82 | HR 79 | Temp 98.5°F | Ht 62.0 in | Wt 276.0 lb

## 2012-01-04 DIAGNOSIS — C9240 Acute promyelocytic leukemia, not having achieved remission: Secondary | ICD-10-CM

## 2012-01-04 DIAGNOSIS — C9200 Acute myeloblastic leukemia, not having achieved remission: Secondary | ICD-10-CM

## 2012-01-04 LAB — CBC & DIFF AND RETIC
BASO%: 0.4 % (ref 0.0–2.0)
EOS%: 0.4 % (ref 0.0–7.0)
HCT: 33.4 % — ABNORMAL LOW (ref 34.8–46.6)
LYMPH%: 50.7 % — ABNORMAL HIGH (ref 14.0–49.7)
MCH: 30.6 pg (ref 25.1–34.0)
MCHC: 34.7 g/dL (ref 31.5–36.0)
MONO#: 0.3 10*3/uL (ref 0.1–0.9)
MONO%: 11.3 % (ref 0.0–14.0)
NEUT%: 37.2 % — ABNORMAL LOW (ref 38.4–76.8)
Platelets: 424 10*3/uL — ABNORMAL HIGH (ref 145–400)
RBC: 3.79 10*6/uL (ref 3.70–5.45)
Retic %: 2.09 % (ref 0.70–2.10)
WBC: 2.8 10*3/uL — ABNORMAL LOW (ref 3.9–10.3)

## 2012-01-04 LAB — COMPREHENSIVE METABOLIC PANEL
Albumin: 4.7 g/dL (ref 3.5–5.2)
Alkaline Phosphatase: 97 U/L (ref 39–117)
BUN: 10 mg/dL (ref 6–23)
CO2: 22 mEq/L (ref 19–32)
Glucose, Bld: 113 mg/dL — ABNORMAL HIGH (ref 70–99)
Potassium: 4.3 mEq/L (ref 3.5–5.3)

## 2012-01-04 LAB — LACTATE DEHYDROGENASE: LDH: 199 U/L (ref 94–250)

## 2012-01-04 LAB — MORPHOLOGY: Other Comments: NONE SEEN

## 2012-01-04 LAB — CHCC SMEAR

## 2012-01-04 LAB — PROTIME-INR: Protime: 13.2 Seconds (ref 10.6–13.4)

## 2012-01-04 LAB — MAGNESIUM: Magnesium: 1.8 mg/dL (ref 1.5–2.5)

## 2012-01-04 MED ORDER — METFORMIN HCL 500 MG PO TABS: 500.0000 mg | ORAL_TABLET | Freq: Every day | ORAL | Status: DC

## 2012-01-04 MED ORDER — LIDOCAINE-PRILOCAINE 2.5-2.5 % EX CREA: TOPICAL_CREAM | Freq: Once | CUTANEOUS | Status: DC

## 2012-01-04 NOTE — Progress Notes (Signed)
Clinical Social Worker met with pt and pt's significant other in the exam room to offer support and assess for needs and concerns.  There had been some difficulty with [facility] staff contacting pt to schedule appointments.  CSW and pt discussed contact information and accessibility.  Pt stated her phone was currently inactive.  CSW and pt discussed available resources for phone assistance.  CSW encouraged pt to follow up with her Medicaid application, and to communicate with her caseworker regarding phone assistance.  Pt was familiar with the phone assistance program applications CSW provide for [employer].  Pt plans to complete applications for phone assistance programs as well as follow up with social services.  Pt also provided CSW with contact information for a friend/neighbor that can be used if [facility] needs to contact pt.  CSW provided new contact information to registration; which was recorded in the pt's record.  CSW encouraged pt to call if she has any questions or concerns.     Tamala Julian, MSW, LCSW Clinical Social Worker [facility] [phone]

## 2012-01-04 NOTE — Telephone Encounter (Signed)
IR made patient appointment for port a cath on 01-08-2012 patient to arrive at 12:00pm

## 2012-01-04 NOTE — Progress Notes (Signed)
Referral MD Dr Genia Harold Reason for Referral: Hx of APML  No chief complaint on file. : This is a 26 year old woman that I initially met in the hospital about 6 weeks ago. At that time she presented with leukopenia, thrombocytopenia is seen there is flank pain and possible of nephrolithiasis. 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 She is mostly treated with arsenic and ATRA.her course was complicated by mild DIC her. She ultimately recovered her counts. A followup bone marrow biopsy on 12/25/2011 showed hypercellular marrow. [place] in 5% of cells were suspicious for blasts. 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 She has returned to [facility] to continue consolidation therapy with a TR a./arsenic. She has not obtained any of the medications that she requires. Prior to leaving the hospital she was on insulin as well as lisinopril. She is now taking Provera to keep her periods under control. She is does not have a functioning glucometer. She feels otherwise well, she denies fevers sweats or chills. Her energy level is improving her appetite is stable.   HPI:   Past Medical History  Diagnosis Date  . Diabetes mellitus   . Hypertension   . Nephrolithiasis   . Leukemia     in remission 12-24-11  :  No past surgical history on file.:  Current outpatient prescriptions:medroxyPROGESTERone (PROVERA) 10 MG tablet, Take 1 tablet (10 mg total) by mouth daily., Disp: 10 tablet, Rfl: 0;  aspirin 325 MG tablet, Take 325 mg by mouth daily as needed. For pain, Disp: , Rfl: ;  metFORMIN (GLUCOPHAGE) 500 MG tablet, Take 1 tablet (500 mg total) by mouth daily with breakfast., Disp: 30 tablet, Rfl: 4 oxyCODONE-acetaminophen (PERCOCET) 5-325 MG per tablet, Take 1 tablet by mouth every 4 (four) hours as needed for pain.,  Disp: 20 tablet, Rfl: 0;  DISCONTD: glipiZIDE (GLUCOTROL) 5 MG tablet, Take 1 tablet (5 mg total) by mouth 2 (two) times daily before a meal., Disp: 60 tablet, Rfl: 0 Current facility-administered medications:lidocaine-prilocaine (EMLA) cream, , Topical, Once, Pierce Crane, MD:     . lidocaine-prilocaine   Topical Once  :  Allergies  Allergen Reactions  . Ultram (Tramadol Hcl) Nausea And Vomiting  :  Family History  Problem Relation Age of Onset  . Kidney disease Mother   . Kidney disease Maternal Grandmother   . Diabetes Maternal Grandmother   :  History   Social History  . Marital Status: Single, has a fiancee    Spouse Name: N/A    Number of Children: 1  . Years of Education: N/A   Occupational History  . Not on file. unemployed   Social History Main Topics  . Smoking status: Never Smoker   . Smokeless tobacco: Not on file  . Alcohol Use: No  . Drug Use: No  . Sexually Active: Yes    Birth Control/ Protection: None   Other Topics Concern  . Not on file   Social History Narrative  . No narrative on file multiple 1/2 siblings  :  A comprehensive review of systems was negative.  Exam: @IPVITALS @ General appearance: alert, cooperative and appears stated age Eyes: conjunctivae/corneas clear. PERRL, EOM's intact. Fundi benign. Throat: lips, mucosa, and tongue normal; teeth and gums normal Resp: clear to auscultation bilaterally and normal percussion bilaterally Breasts:  normal appearance, no masses or tenderness Chest: clear CVS: nl HS Abdomen: No organomegaly Ext: nl  Basename 01/04/12 1212  WBC 2.8*  HGB 11.6  HCT 33.4*  PLT 424*   No results found for this basename: NA:2,K:2,CL:2,CO2:2,GLUCOSE:2,BUN:2,CREATININE:2,CALCIUM:2 in the last 72 hours  Blood smear reviewno blasts seen  Pathology:as above  No results found.  Assessment and Plan: This is a 26 year old woman with a history of APML, I discussed the situation of her attending at [facility] as well as the patient and her fianc. We have obtained the information regarding a treatment plan using arsenic and it for further consolidation. The patient has been scheduled for a port placement this Friday we'll hope to start treatment next week. We will go ahead with a chemotherapy teaching class. In addition we will have her see the social worker as she has limited means and son had a functioning cell phone that would allow [place] to communicate with her. We have obtained an EKG today to determine the QTc interval. In addition we'll obtain blood work checking Potassium and magnesium levels. We'll also need to obtain the oral chemotherapy medication for her .   Now a half was spent with this patient half the time a patient-related counseling   Pierce Crane M.D. FRCP C.

## 2012-01-04 NOTE — Progress Notes (Signed)
Patient approved for financial assistance EPP 100%, for family of 2 , income 0.00 patient has letter of support.

## 2012-01-04 NOTE — Progress Notes (Signed)
Patient came in today, patient does not have insurance, patient stated that she has applied for medicaid. I gave patient EPP application explained the financial advocate process to patient. I also let patient know that I would contact her case worker for medicaid to see where the process is. Patient also inquired about speaking to social worker before she leaves today. I will contact our social workers to see if they can speak with her today.

## 2012-01-04 NOTE — Progress Notes (Signed)
Arsenic trioxide and tretinoin are not replaceable drugs.

## 2012-01-04 NOTE — Telephone Encounter (Signed)
IR made patient appointment for port a cath on 01-08-2012 patient to arrive at 12:00pm 

## 2012-01-05 ENCOUNTER — Telehealth: Payer: Self-pay | Admitting: Oncology

## 2012-01-05 NOTE — Telephone Encounter (Signed)
Patient called in today to see if she had to pay the 20.00 for medication that was at pharmacy. I explain to the patient that she was okay she did not have to pay it that she could go ahead and pick it up. Patient was approved for financial assistance.

## 2012-01-06 ENCOUNTER — Telehealth: Payer: Self-pay | Admitting: Oncology

## 2012-01-06 ENCOUNTER — Encounter: Payer: Self-pay | Admitting: Oncology

## 2012-01-06 ENCOUNTER — Other Ambulatory Visit: Payer: Self-pay | Admitting: Radiology

## 2012-01-06 NOTE — Progress Notes (Signed)
Patient received one prescription from [facility] [facility] on 01/05/12 $3.00,her remaninig balance [facility] $397.00.

## 2012-01-06 NOTE — Telephone Encounter (Signed)
Patient called in today asking about speaking with social worker Abigail. She stated she couldn't get in touch with her, I asked her about leaving a voicemail for Abigail, patient stated that she didn't have a number that she could really leave her at the times that she called. I asked patient if she was going to be at the number she was calling from for a while she said , but that I could give her neighbors number to [place] to call her back. [phone].

## 2012-01-07 ENCOUNTER — Other Ambulatory Visit: Payer: Self-pay | Admitting: Radiology

## 2012-01-07 ENCOUNTER — Other Ambulatory Visit: Payer: Self-pay | Admitting: Oncology

## 2012-01-08 ENCOUNTER — Telehealth: Payer: Self-pay | Admitting: *Deleted

## 2012-01-08 ENCOUNTER — Other Ambulatory Visit: Payer: Self-pay | Admitting: Oncology

## 2012-01-08 ENCOUNTER — Ambulatory Visit (HOSPITAL_COMMUNITY)
Admission: RE | Admit: 2012-01-08 | Discharge: 2012-01-08 | Disposition: A | Discharge status: Home / Self Care | Payer: Medicaid Other | Source: Ambulatory Visit | Attending: Oncology | Admitting: Oncology

## 2012-01-08 ENCOUNTER — Encounter (HOSPITAL_COMMUNITY): Payer: Self-pay

## 2012-01-08 VITALS — BP 125/74 | HR 80 | Temp 98.5°F | Resp 21

## 2012-01-08 DIAGNOSIS — C9240 Acute promyelocytic leukemia, not having achieved remission: Secondary | ICD-10-CM

## 2012-01-08 DIAGNOSIS — E119 Type 2 diabetes mellitus without complications: Secondary | ICD-10-CM | POA: Insufficient documentation

## 2012-01-08 DIAGNOSIS — C9200 Acute myeloblastic leukemia, not having achieved remission: Secondary | ICD-10-CM | POA: Insufficient documentation

## 2012-01-08 DIAGNOSIS — I10 Essential (primary) hypertension: Secondary | ICD-10-CM | POA: Insufficient documentation

## 2012-01-08 DIAGNOSIS — Z79899 Other long term (current) drug therapy: Secondary | ICD-10-CM | POA: Insufficient documentation

## 2012-01-08 DIAGNOSIS — Z7982 Long term (current) use of aspirin: Secondary | ICD-10-CM | POA: Insufficient documentation

## 2012-01-08 MED ORDER — SODIUM CHLORIDE 0.9 % IV SOLN: INTRAVENOUS | Status: DC

## 2012-01-08 MED ORDER — CEFAZOLIN SODIUM-DEXTROSE 2-3 GM-% IV SOLR
INTRAVENOUS | Status: AC
  Filled 2012-01-08: qty 50

## 2012-01-08 MED ORDER — FENTANYL CITRATE 0.05 MG/ML IJ SOLN
INTRAMUSCULAR | Status: AC | PRN
  Administered 2012-01-08: 100 ug via INTRAVENOUS

## 2012-01-08 MED ORDER — MIDAZOLAM HCL 5 MG/5ML IJ SOLN
INTRAMUSCULAR | Status: AC | PRN
  Administered 2012-01-08 (×2): 2 mg via INTRAVENOUS

## 2012-01-08 MED ORDER — LIDOCAINE HCL 1 % IJ SOLN
INTRAMUSCULAR | Status: AC
  Filled 2012-01-08: qty 20

## 2012-01-08 MED ORDER — MIDAZOLAM HCL 2 MG/2ML IJ SOLN
INTRAMUSCULAR | Status: AC
  Filled 2012-01-08: qty 4

## 2012-01-08 MED ORDER — FENTANYL CITRATE 0.05 MG/ML IJ SOLN
INTRAMUSCULAR | Status: AC
  Filled 2012-01-08: qty 2

## 2012-01-08 MED ORDER — CEFAZOLIN SODIUM-DEXTROSE 2-3 GM-% IV SOLR
2.0000 g | Freq: Once | INTRAVENOUS | Status: AC
  Administered 2012-01-08: 2 g via INTRAVENOUS

## 2012-01-08 NOTE — Discharge Instructions (Signed)

## 2012-01-08 NOTE — Telephone Encounter (Signed)
spoke to the patient's soon to be husband and he stated that the patient needs to be home by 3:15 pm to get the daughter off the bus relied the message to [facility] in the chemo room

## 2012-01-08 NOTE — Telephone Encounter (Signed)
gave patient appointment for 12-2011 thru 01-2012 sent michelle email to get the treatment set up for 01-11-2012

## 2012-01-08 NOTE — H&P (Signed)
Melissa Washington is an 26 y.o. female.   Chief Complaint: Acute promyelocytic leukemia HPI: [name] with newly diagnosed leukemia is in need of portacath placement for means of chemotherapy. Procedure scheduled with IR.  Past Medical History  Diagnosis Date  . Diabetes mellitus   . Hypertension   . Nephrolithiasis   . Leukemia     in remission 12-24-11    History reviewed. No pertinent past surgical history.  Family History  Problem Relation Age of Onset  . Kidney disease Mother   . Kidney disease Maternal Grandmother   . Diabetes Maternal Grandmother    Social History:  reports that she has never smoked. She does not have any smokeless tobacco history on file. She reports that she does not drink alcohol or use illicit drugs.  Allergies:  Allergies  Allergen Reactions  . Ultram (Tramadol Hcl) Nausea And Vomiting    Medications Prior to Admission  Medication Sig Dispense Refill  . aspirin 325 MG tablet Take 325 mg by mouth daily as needed. For pain      . medroxyPROGESTERone (PROVERA) 10 MG tablet Take 1 tablet (10 mg total) by mouth daily.  10 tablet  0  . metFORMIN (GLUCOPHAGE) 500 MG tablet Take 1 tablet (500 mg total) by mouth daily with breakfast.  30 tablet  4  . DISCONTD: glipiZIDE (GLUCOTROL) 5 MG tablet Take 1 tablet (5 mg total) by mouth 2 (two) times daily before a meal.  60 tablet  0   Medications Prior to Admission  Medication Dose Route Frequency Provider Last Rate Last Dose  . 0.9 %  sodium chloride infusion   Intravenous Continuous D Jeananne Rama, PA      . ceFAZolin (ANCEF) 2-3 GM-% IVPB SOLR           . ceFAZolin (ANCEF) IVPB 2 g/50 mL premix  2 g Intravenous Once D Jeananne Rama, PA        Results for orders placed during the hospital encounter of 01/08/12 (from the past 48 hour(s))  GLUCOSE, CAPILLARY     Status: Abnormal   Collection Time   01/08/12 12:41 PM      Component Value Range Comment   Glucose-Capillary 103 (*) 70 - 99 (mg/dL)      No results found.  Review of Systems  Constitutional: Positive for malaise/fatigue. Negative for fever and chills.  Respiratory: Negative for cough, hemoptysis and shortness of breath.   Cardiovascular: Negative for chest pain, palpitations and leg swelling.  Gastrointestinal: Negative for nausea, vomiting, abdominal pain, diarrhea and constipation.  Genitourinary: Negative for dysuria and hematuria.  Musculoskeletal: Negative for myalgias.    Blood pressure 134/99, pulse 83, temperature 98.5 F (36.9 C), resp. rate 21, last menstrual period 12/28/2011, SpO2 100.00%. Physical Exam  Constitutional: She is oriented to person, place, and time. She appears well-developed and well-nourished. No distress.  HENT:  Head: Normocephalic and atraumatic.  Cardiovascular: Normal rate, regular rhythm, normal heart sounds and intact distal pulses.  Exam reveals no friction rub.   No murmur heard. Respiratory: Effort normal and breath sounds normal. No respiratory distress. She has no wheezes.  GI: Soft. Bowel sounds are normal. She exhibits no distension. There is no tenderness.  Neurological: She is alert and oriented to person, place, and time.  Psychiatric: She has a normal mood and affect. Her behavior is normal. Judgment and thought content normal.     Assessment/Plan APML, requiring chemotherapy. Procedure of port placement,  including risks, complications  discussed with pt. Consent signed in chart.  Brayton El PA-C 01/08/2012, 12:55 PM

## 2012-01-08 NOTE — Telephone Encounter (Signed)
gave patient business cards for [facility] and abigail elmore the social worker gave them the information over in the radiology department

## 2012-01-08 NOTE — Procedures (Signed)
Placement of right jugular portacath.  Tip in lower SVC.  Ready to use.   

## 2012-01-08 NOTE — H&P (Signed)
Agree 

## 2012-01-11 ENCOUNTER — Other Ambulatory Visit: Payer: Self-pay

## 2012-01-11 ENCOUNTER — Other Ambulatory Visit (HOSPITAL_BASED_OUTPATIENT_CLINIC_OR_DEPARTMENT_OTHER): Payer: Self-pay

## 2012-01-11 ENCOUNTER — Other Ambulatory Visit: Payer: Self-pay | Admitting: Oncology

## 2012-01-11 ENCOUNTER — Ambulatory Visit: Payer: Self-pay

## 2012-01-11 ENCOUNTER — Inpatient Hospital Stay (HOSPITAL_COMMUNITY)
Admission: AD | Admit: 2012-01-11 | Discharge: 2012-01-15 | DRG: 838 | Disposition: A | Discharge status: Home / Self Care | Payer: Medicaid Other | Source: Ambulatory Visit | Attending: Oncology | Admitting: Oncology

## 2012-01-11 ENCOUNTER — Encounter (HOSPITAL_COMMUNITY): Payer: Self-pay | Admitting: *Deleted

## 2012-01-11 ENCOUNTER — Encounter: Payer: Self-pay | Admitting: Physician Assistant

## 2012-01-11 ENCOUNTER — Ambulatory Visit (HOSPITAL_COMMUNITY): Payer: Self-pay | Admitting: Physician Assistant

## 2012-01-11 VITALS — BP 143/92 | HR 80 | Temp 98.2°F | Ht 62.0 in | Wt 275.0 lb

## 2012-01-11 DIAGNOSIS — C9200 Acute myeloblastic leukemia, not having achieved remission: Secondary | ICD-10-CM

## 2012-01-11 DIAGNOSIS — L2089 Other atopic dermatitis: Secondary | ICD-10-CM

## 2012-01-11 DIAGNOSIS — N12 Tubulo-interstitial nephritis, not specified as acute or chronic: Secondary | ICD-10-CM

## 2012-01-11 DIAGNOSIS — C9240 Acute promyelocytic leukemia, not having achieved remission: Secondary | ICD-10-CM

## 2012-01-11 DIAGNOSIS — E119 Type 2 diabetes mellitus without complications: Secondary | ICD-10-CM | POA: Diagnosis present

## 2012-01-11 DIAGNOSIS — Z6841 Body Mass Index (BMI) 40.0 and over, adult: Secondary | ICD-10-CM

## 2012-01-11 DIAGNOSIS — E669 Obesity, unspecified: Secondary | ICD-10-CM | POA: Diagnosis present

## 2012-01-11 DIAGNOSIS — Z5111 Encounter for antineoplastic chemotherapy: Principal | ICD-10-CM

## 2012-01-11 DIAGNOSIS — E876 Hypokalemia: Secondary | ICD-10-CM | POA: Diagnosis present

## 2012-01-11 DIAGNOSIS — I10 Essential (primary) hypertension: Secondary | ICD-10-CM

## 2012-01-11 DIAGNOSIS — L0390 Cellulitis, unspecified: Secondary | ICD-10-CM

## 2012-01-11 DIAGNOSIS — N6009 Solitary cyst of unspecified breast: Secondary | ICD-10-CM

## 2012-01-11 DIAGNOSIS — N390 Urinary tract infection, site not specified: Secondary | ICD-10-CM

## 2012-01-11 LAB — COMPREHENSIVE METABOLIC PANEL
Albumin: 4.2 g/dL (ref 3.5–5.2)
Alkaline Phosphatase: 101 U/L (ref 39–117)
Alkaline Phosphatase: 96 U/L (ref 39–117)
BUN: 9 mg/dL (ref 6–23)
BUN: 10 mg/dL (ref 6–23)
Glucose, Bld: 147 mg/dL — ABNORMAL HIGH (ref 70–99)
Potassium: 3.3 mEq/L — ABNORMAL LOW (ref 3.5–5.1)
Sodium: 140 mEq/L (ref 135–145)
Sodium: 139 mEq/L (ref 135–145)
Total Bilirubin: 0.9 mg/dL (ref 0.3–1.2)
Total Protein: 7.1 g/dL (ref 6.0–8.3)

## 2012-01-11 LAB — HCG, SERUM, QUALITATIVE: Preg, Serum: NEGATIVE

## 2012-01-11 LAB — CBC
MCHC: 34.3 g/dL (ref 30.0–36.0)
RDW: 15.1 % (ref 11.5–15.5)

## 2012-01-11 LAB — DIFFERENTIAL
Eosinophils Absolute: 0 10*3/uL (ref 0.0–0.7)
Lymphocytes Relative: 24 % (ref 12–46)
Lymphs Abs: 1.6 10*3/uL (ref 0.7–4.0)
Monocytes Relative: 7 % (ref 3–12)
Neutrophils Relative %: 69 % (ref 43–77)

## 2012-01-11 LAB — CBC WITH DIFFERENTIAL/PLATELET
Basophils Absolute: 0 10*3/uL (ref 0.0–0.1)
EOS%: 0.2 % (ref 0.0–7.0)
HGB: 11.9 g/dL (ref 11.6–15.9)
MCH: 30.8 pg (ref 25.1–34.0)
MCV: 90.5 fL (ref 79.5–101.0)
MONO%: 9.1 % (ref 0.0–14.0)
NEUT%: 68.2 % (ref 38.4–76.8)
RDW: 16.7 % — ABNORMAL HIGH (ref 11.2–14.5)

## 2012-01-11 LAB — GLUCOSE, CAPILLARY

## 2012-01-11 MED ORDER — MEDROXYPROGESTERONE ACETATE 10 MG PO TABS
10.0000 mg | ORAL_TABLET | Freq: Every day | ORAL | Status: DC
  Administered 2012-01-11 – 2012-01-15 (×5): 10 mg via ORAL
  Filled 2012-01-11 (×5): qty 1

## 2012-01-11 MED ORDER — ACETAMINOPHEN 325 MG PO TABS
650.0000 mg | ORAL_TABLET | Freq: Four times a day (QID) | ORAL | Status: DC | PRN
  Administered 2012-01-12: 650 mg via ORAL
  Filled 2012-01-11 (×2): qty 2

## 2012-01-11 MED ORDER — POTASSIUM CHLORIDE CRYS ER 20 MEQ PO TBCR
20.0000 meq | EXTENDED_RELEASE_TABLET | Freq: Two times a day (BID) | ORAL | Status: DC
  Administered 2012-01-11 – 2012-01-12 (×2): 20 meq via ORAL
  Filled 2012-01-11 (×3): qty 1

## 2012-01-11 MED ORDER — SODIUM CHLORIDE 0.9 % IV SOLN
Freq: Once | INTRAVENOUS | Status: AC
  Administered 2012-01-11: 16:00:00 via INTRAVENOUS

## 2012-01-11 MED ORDER — SODIUM CHLORIDE 0.9 % IJ SOLN: 3.0000 mL | INTRAMUSCULAR | Status: DC | PRN

## 2012-01-11 MED ORDER — TRETINOIN 10 MG PO CAPS: 45.0000 mg/m2/d | ORAL_CAPSULE | Freq: Two times a day (BID) | ORAL | Status: DC

## 2012-01-11 MED ORDER — HYDROCODONE-ACETAMINOPHEN 5-325 MG PO TABS
1.0000 | ORAL_TABLET | ORAL | Status: DC | PRN
  Administered 2012-01-12 – 2012-01-14 (×7): 1 via ORAL
  Administered 2012-01-15 (×2): 2 via ORAL
  Filled 2012-01-11 (×4): qty 1
  Filled 2012-01-11: qty 2
  Filled 2012-01-11 (×3): qty 1
  Filled 2012-01-11: qty 2

## 2012-01-11 MED ORDER — ALTEPLASE 2 MG IJ SOLR
2.0000 mg | Freq: Once | INTRAMUSCULAR | Status: AC | PRN
  Filled 2012-01-11: qty 2

## 2012-01-11 MED ORDER — SODIUM CHLORIDE 0.9 % IJ SOLN
10.0000 mL | INTRAMUSCULAR | Status: DC | PRN
  Administered 2012-01-15: 10 mL

## 2012-01-11 MED ORDER — MAGNESIUM OXIDE 400 MG PO TABS
400.0000 mg | ORAL_TABLET | Freq: Two times a day (BID) | ORAL | Status: DC
  Administered 2012-01-11 – 2012-01-15 (×8): 400 mg via ORAL
  Filled 2012-01-11 (×9): qty 1

## 2012-01-11 MED ORDER — ACETAMINOPHEN 650 MG RE SUPP: 650.0000 mg | Freq: Four times a day (QID) | RECTAL | Status: DC | PRN

## 2012-01-11 MED ORDER — SODIUM CHLORIDE 0.9 % IV SOLN
Freq: Once | INTRAVENOUS | Status: AC
  Administered 2012-01-11: 8 mg via INTRAVENOUS
  Filled 2012-01-11: qty 4

## 2012-01-11 MED ORDER — PROMETHAZINE HCL 25 MG PO TABS
12.5000 mg | ORAL_TABLET | Freq: Four times a day (QID) | ORAL | Status: DC | PRN
  Administered 2012-01-12: 12.5 mg via ORAL
  Filled 2012-01-11: qty 1

## 2012-01-11 MED ORDER — HEPARIN SOD (PORK) LOCK FLUSH 100 UNIT/ML IV SOLN: 250.0000 [IU] | Freq: Once | INTRAVENOUS | Status: AC | PRN

## 2012-01-11 MED ORDER — METFORMIN HCL 500 MG PO TABS
500.0000 mg | ORAL_TABLET | Freq: Every day | ORAL | Status: DC
  Administered 2012-01-11 – 2012-01-12 (×2): 500 mg via ORAL
  Filled 2012-01-11 (×2): qty 1

## 2012-01-11 MED ORDER — SODIUM CHLORIDE 0.9 % IV SOLN
INTRAVENOUS | Status: DC
  Administered 2012-01-11 – 2012-01-15 (×5): via INTRAVENOUS

## 2012-01-11 MED ORDER — TRETINOIN 10 MG PO CAPS
45.0000 mg/m2/d | ORAL_CAPSULE | Freq: Two times a day (BID) | ORAL | Status: DC
  Administered 2012-01-11 – 2012-01-15 (×9): 50 mg via ORAL
  Filled 2012-01-11 (×12): qty 5

## 2012-01-11 MED ORDER — SODIUM CHLORIDE 0.9 % IV SOLN
0.1500 mg/kg | Freq: Once | INTRAVENOUS | Status: AC
  Administered 2012-01-11: 12 mg via INTRAVENOUS
  Filled 2012-01-11: qty 12

## 2012-01-11 MED ORDER — HEPARIN SOD (PORK) LOCK FLUSH 100 UNIT/ML IV SOLN: 500.0000 [IU] | Freq: Once | INTRAVENOUS | Status: AC | PRN

## 2012-01-11 NOTE — Progress Notes (Signed)
Admission note completed-CTS

## 2012-01-11 NOTE — Progress Notes (Signed)
Pt. Has had elevated blood pressure at 2040 but asymptomatic. Pt has had elevated temp of 99.9 at 2040 but asymptomatic and WBC is WNL. Paged MD on call and discussed, will await Dr. Donnie Coffin rounding in the am to address. Merlyn Albert [employer] fundraiser

## 2012-01-11 NOTE — H&P (Signed)
Melissa Washington is an 26 y.o. female.   Chief Complaint: Patient is a 26 year old [place], [place] woman with APL, being admitted to 3rd floor Oncology to initiate her 2nd cycle of Arsenic trioxide dosed 0.25mg /kg IV M-F, given in combination with Tretinion 45mg /m2 orally daily in divided doses days1-14. HPI: Melissa Washington voices no complaints today.  She had her R anterior chest wall PORT placed on 01/08/12.  Except for it being abit sore, she denies frank pain for swelling in the region.  She admits that she has not been taking her OC as prescribed via [facility].  She also states she ran out of Provera "a few days ago".  She denies any vaginal bleeding.  She does not have a means of checking her blood sugars at home, but has been taking her metformin 500mg  orally daily.  She denies frank issues with neuropathy, but did experience an episode of "numbness" of her bilateral lower ext. Which was transient, and currently not present.  She denies fevers, obvious chills, or episodes of night sweats.She denies vision changes or headaches.  Past Medical History  Diagnosis Date  . Diabetes mellitus   . Hypertension   . Nephrolithiasis   . Leukemia     in remission 12-24-11    No past surgical history on file.  Family History  Problem Relation Age of Onset  . Kidney disease Mother   . Kidney disease Maternal Grandmother   . Diabetes Maternal Grandmother    Social History:  reports that she has never smoked. She does not have any smokeless tobacco history on file. She reports that she does not drink alcohol or use illicit drugs.  Allergies:  Allergies  Allergen Reactions  . Ultram (Tramadol Hcl) Nausea And Vomiting     (Not in a hospital admission)  Results for orders placed in visit on 01/11/12 (from the past 48 hour(s))  CBC WITH DIFFERENTIAL     Status: Abnormal   Collection Time   01/11/12  9:22 AM      Component Value Range Comment   WBC 6.4  3.9 - 10.3 (10e3/uL)    NEUT# 4.3   1.5 - 6.5 (10e3/uL)    HGB 11.9  11.6 - 15.9 (g/dL)    HCT 95.6  21.3 - 08.6 (%)    Platelets 194  145 - 400 (10e3/uL)    MCV 90.5  79.5 - 101.0 (fL)    MCH 30.8  25.1 - 34.0 (pg)    MCHC 34.1  31.5 - 36.0 (g/dL)    RBC 5.78  4.69 - 6.29 (10e6/uL)    RDW 16.7 (*) 11.2 - 14.5 (%)    lymph# 1.4  0.9 - 3.3 (10e3/uL)    MONO# 0.6  0.1 - 0.9 (10e3/uL)    Eosinophils Absolute 0.0  0.0 - 0.5 (10e3/uL)    Basophils Absolute 0.0  0.0 - 0.1 (10e3/uL)    NEUT% 68.2  38.4 - 76.8 (%)    LYMPH% 22.3  14.0 - 49.7 (%)    MONO% 9.1  0.0 - 14.0 (%)    EOS% 0.2  0.0 - 7.0 (%)    BASO% 0.2  0.0 - 2.0 (%)    nRBC 0  0 - 0 (%)   COMPREHENSIVE METABOLIC PANEL     Status: Abnormal   Collection Time   01/11/12  9:24 AM      Component Value Range Comment   Sodium 140  135 - 145 (mEq/L)  Potassium 3.4 (*) 3.5 - 5.3 (mEq/L)    Chloride 103  96 - 112 (mEq/L)    CO2 24  19 - 32 (mEq/L)    Glucose, Bld 147 (*) 70 - 99 (mg/dL)    BUN 9  6 - 23 (mg/dL)    Creatinine, Ser 9.81  0.50 - 1.10 (mg/dL)    Total Bilirubin 0.9  0.3 - 1.2 (mg/dL)    Alkaline Phosphatase 101  39 - 117 (U/L)    AST 19  0 - 37 (U/L)    ALT 24  0 - 35 (U/L)    Total Protein 7.1  6.0 - 8.3 (g/dL)    Albumin 4.4  3.5 - 5.2 (g/dL)    Calcium 9.5  8.4 - 10.5 (mg/dL)   MAGNESIUM     Status: Normal   Collection Time   01/11/12  9:24 AM      Component Value Range Comment   Magnesium 1.7  1.5 - 2.5 (mg/dL)    No results found.  Review of Systems  Constitutional: Negative.   HENT: Negative.   Eyes: Negative.   Respiratory: Negative.   Cardiovascular: Negative.   Gastrointestinal: Negative.   Genitourinary: Negative.   Musculoskeletal: Negative.   Skin: Negative.   Neurological: Negative.   Endo/Heme/Allergies: Negative.   Psychiatric/Behavioral: Negative.     Blood pressure 143/92, pulse 80, temperature 98.2 F (36.8 C), temperature source Oral, height 5\' 2"  (1.575 m), weight 275 lb (124.739 kg), last menstrual period  12/28/2011. Physical Exam  Constitutional: She is oriented to person, place, and time. She appears well-developed and well-nourished.  HENT:  Nose: Nose normal.  Mouth/Throat: Oropharynx is clear and moist.  Eyes: Conjunctivae and EOM are normal. Pupils are equal, round, and reactive to light.  Neck: Normal range of motion.  Cardiovascular: Normal rate, regular rhythm and normal heart sounds.        PORT R upper ant chest wall  Respiratory: Effort normal and breath sounds normal.  GI: Soft. Bowel sounds are normal.  Musculoskeletal: Normal range of motion.  Neurological: She is alert and oriented to person, place, and time.  Skin: Skin is warm and dry.  Psychiatric: She has a normal mood and affect. Her behavior is normal. Judgment and thought content normal.     Assessment/Plan Patient is a 26 year old [place], [place] woman with newly diagnosed APL, being admitted to 3rd floor Oncology to initiate her  2nd cycle of Arsenic trioxide dosed 0.25mg /kg IV M-F, given in combination with Tretinion 45mg /m2 orally daily in divided doses days1-14.  Upon admit, we will check serum chemistries along with serum magnesium.  Due to lack of OC's, we will also check a STAT serum pregnancy.  During this admit, Social Work consult will be obtained due to multiple barriers for ongoing medical care.  Melissa Washington T 01/11/2012, 1:40 PM

## 2012-01-11 NOTE — Evaluation (Addendum)
Please see accompanying note by PA Is a 26 year old woman with a history of APML status post induction chemotherapy. We are admitting her essentially to facilitate getting her necessary medication for further treatment. She'll be receiving IV arsenic daily for 5 days as well as PO ATRA. Will monitor her carefully for the possibility of ATRA  type syndrome.  Pierce Crane MD, FRCPC

## 2012-01-12 ENCOUNTER — Ambulatory Visit: Payer: Self-pay

## 2012-01-12 ENCOUNTER — Encounter: Payer: Self-pay | Admitting: Oncology

## 2012-01-12 DIAGNOSIS — E119 Type 2 diabetes mellitus without complications: Secondary | ICD-10-CM

## 2012-01-12 DIAGNOSIS — C9200 Acute myeloblastic leukemia, not having achieved remission: Secondary | ICD-10-CM

## 2012-01-12 LAB — COMPREHENSIVE METABOLIC PANEL
ALT: 24 U/L (ref 0–35)
CO2: 22 mEq/L (ref 19–32)
Calcium: 10.5 mg/dL (ref 8.4–10.5)
GFR calc Af Amer: >90 mL/min (ref >90)
GFR calc non Af Amer: >90 mL/min (ref >90)
Glucose, Bld: 268 mg/dL — ABNORMAL HIGH (ref 70–99)
Sodium: 134 mEq/L — ABNORMAL LOW (ref 135–145)

## 2012-01-12 LAB — GLUCOSE, CAPILLARY: Glucose-Capillary: 335 mg/dL — ABNORMAL HIGH (ref 70–99)

## 2012-01-12 MED ORDER — SODIUM CHLORIDE 0.9 % IV SOLN
0.1500 mg/kg | Freq: Once | INTRAVENOUS | Status: AC
  Administered 2012-01-12: 12 mg via INTRAVENOUS
  Filled 2012-01-12: qty 12

## 2012-01-12 MED ORDER — MAGNESIUM SULFATE 40 MG/ML IJ SOLN
2.0000 g | Freq: Once | INTRAMUSCULAR | Status: AC
Start: 2012-01-12 — End: 2012-01-12
  Administered 2012-01-12: 2 g via INTRAVENOUS
  Filled 2012-01-12: qty 50

## 2012-01-12 MED ORDER — METFORMIN HCL 500 MG PO TABS
1000.0000 mg | ORAL_TABLET | Freq: Every day | ORAL | Status: DC
  Administered 2012-01-13 – 2012-01-15 (×3): 1000 mg via ORAL
  Filled 2012-01-12 (×4): qty 2

## 2012-01-12 MED ORDER — SODIUM CHLORIDE 0.9 % IV SOLN
Freq: Once | INTRAVENOUS | Status: AC
  Administered 2012-01-12: 8 mg via INTRAVENOUS
  Filled 2012-01-12: qty 4

## 2012-01-12 MED ORDER — POTASSIUM CHLORIDE CRYS ER 20 MEQ PO TBCR
20.0000 meq | EXTENDED_RELEASE_TABLET | Freq: Once | ORAL | Status: AC
  Administered 2012-01-12: 20 meq via ORAL
  Filled 2012-01-12: qty 1

## 2012-01-12 MED ORDER — INSULIN ASPART 100 UNIT/ML ~~LOC~~ SOLN
8.0000 [IU] | Freq: Once | SUBCUTANEOUS | Status: AC
  Administered 2012-01-12: 8 [IU] via SUBCUTANEOUS

## 2012-01-12 NOTE — Progress Notes (Signed)
Subjective: 26 year old with history of APML. currently being managed for consolidation chemotherapy with Vesanoid and arsenic IV she has tolerated treatment. She awoke with mild headache. She had some mild nausea yesterday but today is feeling fairly good. She has no other complaints. She denies any mouth pain, shortness of breath cough. She has not had any fevers. Blood sugars have been a little bit elevated. As has her blood pressure.   Objective: Temp:  [98.2 F (36.8 C)-99.9 F (37.7 C)] 98.2 F (36.8 C) (04/23 1508) Pulse Rate:  [73-93] 82  (04/23 1508) Resp:  [20] 20  (04/23 1508) BP: (111-169)/(69-109) 138/81 mmHg (04/23 1508) SpO2:  [98 %-99 %] 99 % (04/23 1508) Weight:  [275 lb (124.739 kg)] 275 lb (124.739 kg) (04/23 0704)  General appearance: alert, cooperative and appears stated age Eyes: conjunctivae/corneas clear. PERRL, EOM's intact. Fundi benign. Throat: lips, mucosa, and tongue normal; teeth and gums normal Resp: clear to auscultation bilaterally and normal percussion bilaterally Cardio: regular rate and rhythm, S1, S2 normal, no murmur, click, rub or gallop GI: soft, non-tender; bowel sounds normal; no masses,  no organomegaly Extremities: extremities normal, atraumatic, no cyanosis or edema Pulses: 2+ and symmetric Neurologic: Grossly normal  Labs:  [facility] 01/11/12 1451 01/11/12 0922  WBC 6.6 6.4  HGB 11.2* 11.9  HCT 32.7* 34.8  PLT 214 194    Basename 01/12/12 0610 01/11/12 1451  NA 134* 139  K 4.2 3.3*  CL 100 104  CO2 22 24  GLUCOSE 268* 138*  BUN 13 10  CREATININE 0.58 0.52  CALCIUM 10.5 9.5    Results for orders placed during the hospital encounter of 12/27/11  WET PREP, GENITAL     Status: Abnormal   Collection Time   12/27/11  2:27 PM      Component Value Range Status Comment   Yeast Wet Prep HPF POC NONE SEEN  NONE SEEN  Final    Trich, Wet Prep NONE SEEN  NONE SEEN  Final    Clue Cells Wet Prep HPF POC NONE SEEN  NONE SEEN  Final    WBC,  Wet Prep HPF POC FEW (*) NONE SEEN  Final      No results found.:  Pathology: Microgranular APM L.   :  Disposition: The patient is doing fairly well. Her labs are been reviewed. Your potassium is fairly good and her magnesium level still is low. We will given IV dose of magnesium and cut back on her oral potassium as well as begin sliding scale insulin. The process of trying to get her for oral medication as this is one of a limiting factor as that is keeping her in the hospital. Social work is on prior to try to facilitate this. Today I have heard that there is no indigent program to supply this medication.   Pierce Crane M.D. FRCP C.

## 2012-01-12 NOTE — Progress Notes (Signed)
CRITICAL VALUE ALERT  Critical value received:  CBG 406  Date of notification:  01/12/12  Time of notification:  2245  Critical value read back:yes  Nurse who received alert:  Jilda Panda, RN  MD notified (1st page):  Dr. Donnie Coffin  Time of first page:  2245  MD notified (2nd page):  Time of second page:  Responding MD:  Dr. Donnie Coffin  Time MD responded:  2250

## 2012-01-12 NOTE — Progress Notes (Signed)
Clinical Social Work Department BRIEF PSYCHOSOCIAL ASSESSMENT 01/12/2012  Patient:  Melissa Washington, Melissa Washington     Account Number:  [id number]     Admit date:  01/11/2012  Clinical Social Worker:  Larene Beach  Date/Time:  01/12/2012 11:00 AM  Referred by:  Physician  Date Referred:  01/11/2012 Referred for  Psychosocial assessment   Interview type:  Patient Other interview type:   Patient's fiance   PSYCHOSOCIAL DATA Living Status:  SIGNIFICANT OTHER Primary support name:  Melissa Washington Primary support relationship to patient:SIGNIFICANT OTHER Degree of support available:   Pt's significant other Melissa Washington is a positive support system to pt.   CURRENT CONCERNS Current Concerns  Financial Resources  Other - See comment   Other Concerns:   Pt has no insurance; therefore has limited access to medication.   SOCIAL WORK ASSESSMENT / PLAN Clinical Social Worker has been following pt in the out-patient setting at [facility].  Pt has applied for Medicaid, and been approved for 100% discount at [facility].  CSW was consulted for assistance with medications and health care supplies.  CSW also notified RN Case Manager that consult was for assistance with medications and health care supplies.  RN-CM aware and providing support.  CSW is working with [facility] staff to identify medication assistance resources for pt.  Pt's fiance was able to provide proof of income; which was given to appropriate staff.   Assessment/plan status:  Information/Referral to [facility] Other assessment/ plan:   CSW will continue to follow and offer support as needed.   PATIENT'S/FAMILY'S RESPONSE TO PLAN OF CARE: Pt and pt's fiance were appreciative of CSW support.  Pt and pt's fiance are actively involved in pt's care.  CSW encouraged pt to call with any questions or concerns.    Melissa Washington, MSW, LCSW Clinical Social Worker [facility] [phone]

## 2012-01-12 NOTE — Progress Notes (Signed)
Inpatient Diabetes Program Recommendations  AACE/ADA: New Consensus Statement on Inpatient Glycemic Control (2009)  Target Ranges:  Prepandial:   less than 140 mg/dL      Peak postprandial:   less than 180 mg/dL (1-2 hours)      Critically ill patients:  140 - 180 mg/dL   Reason for assessment: Hyperglycemia Results for DAVITA, SUBLETT (MRN [id number]) as of 01/12/2012 15:32  Ref. Range 01/11/2012 21:35 01/12/2012 07:59 01/12/2012 11:49  Glucose-Capillary Latest Range: 70-99 mg/dL 409 (H) 811 (H) 914 (H)    Inpatient Diabetes Program Recommendations Correction (SSI): Start Novolog MODERATE scale TID + HS   Thank you  Piedad Climes [facility] Inpatient Diabetes Coordinator [phone]

## 2012-01-12 NOTE — Progress Notes (Signed)
Called [employer] @ [phone] they have discontinued Vesanoid (brand name for tretinoin), therefore assistance is not available.  Rep gave me [employer] patient assistance # [id number], they offer assistance for tretinoin (generic).  Per teva they no longer offer assistance as of June 2012.  [employer] rep also told me to contact [facility] Out[facility]; they do not offer assistance for this drug.  Informed MD.

## 2012-01-13 ENCOUNTER — Ambulatory Visit: Payer: Self-pay

## 2012-01-13 LAB — CBC
HCT: 33.3 % — ABNORMAL LOW (ref 36.0–46.0)
Hemoglobin: 11.6 g/dL — ABNORMAL LOW (ref 12.0–15.0)
MCH: 30.6 pg (ref 26.0–34.0)
MCHC: 34.8 g/dL (ref 30.0–36.0)
MCV: 87.9 fL (ref 78.0–100.0)
RDW: 15.3 % (ref 11.5–15.5)

## 2012-01-13 LAB — GLUCOSE, CAPILLARY
Glucose-Capillary: 198 mg/dL — ABNORMAL HIGH (ref 70–99)
Glucose-Capillary: 303 mg/dL — ABNORMAL HIGH (ref 70–99)

## 2012-01-13 LAB — MAGNESIUM: Magnesium: 2.2 mg/dL (ref 1.5–2.5)

## 2012-01-13 LAB — POTASSIUM: Potassium: 4.1 mEq/L (ref 3.5–5.1)

## 2012-01-13 LAB — PROTIME-INR: INR: 1.12 (ref 0.00–1.49)

## 2012-01-13 LAB — APTT: aPTT: 27 seconds (ref 24–37)

## 2012-01-13 MED ORDER — SODIUM CHLORIDE 0.9 % IV SOLN
Freq: Once | INTRAVENOUS | Status: AC
  Administered 2012-01-13: 8 mg via INTRAVENOUS
  Filled 2012-01-13: qty 4

## 2012-01-13 MED ORDER — INSULIN ASPART 100 UNIT/ML ~~LOC~~ SOLN
0.0000 [IU] | Freq: Every day | SUBCUTANEOUS | Status: DC
  Administered 2012-01-13: 4 [IU] via SUBCUTANEOUS
  Administered 2012-01-15: 5 [IU] via SUBCUTANEOUS

## 2012-01-13 MED ORDER — INSULIN ASPART 100 UNIT/ML ~~LOC~~ SOLN
0.0000 [IU] | Freq: Three times a day (TID) | SUBCUTANEOUS | Status: DC
  Administered 2012-01-13: 3 [IU] via SUBCUTANEOUS
  Administered 2012-01-13: 5 [IU] via SUBCUTANEOUS
  Administered 2012-01-13: 8 [IU] via SUBCUTANEOUS
  Administered 2012-01-14 – 2012-01-15 (×4): 5 [IU] via SUBCUTANEOUS
  Administered 2012-01-15: 11 [IU] via SUBCUTANEOUS
  Administered 2012-01-15: 3 [IU] via SUBCUTANEOUS

## 2012-01-13 MED ORDER — SODIUM CHLORIDE 0.9 % IJ SOLN: 10.0000 mL | INTRAMUSCULAR | Status: DC | PRN

## 2012-01-13 MED ORDER — HEPARIN SOD (PORK) LOCK FLUSH 100 UNIT/ML IV SOLN: 500.0000 [IU] | Freq: Once | INTRAVENOUS | Status: AC | PRN

## 2012-01-13 MED ORDER — SODIUM CHLORIDE 0.9 % IJ SOLN: 3.0000 mL | INTRAMUSCULAR | Status: DC | PRN

## 2012-01-13 MED ORDER — SODIUM CHLORIDE 0.9 % IV SOLN
0.1500 mg/kg/d | INTRAVENOUS | Status: AC
  Administered 2012-01-13: 12 mg via INTRAVENOUS
  Filled 2012-01-13: qty 12

## 2012-01-13 MED ORDER — LISINOPRIL 10 MG PO TABS
10.0000 mg | ORAL_TABLET | Freq: Every day | ORAL | Status: DC
  Administered 2012-01-13 – 2012-01-15 (×3): 10 mg via ORAL
  Filled 2012-01-13 (×3): qty 1

## 2012-01-13 MED ORDER — HEPARIN SOD (PORK) LOCK FLUSH 100 UNIT/ML IV SOLN: 250.0000 [IU] | Freq: Once | INTRAVENOUS | Status: AC | PRN

## 2012-01-13 MED ORDER — SODIUM CHLORIDE 0.9 % IV SOLN: Freq: Once | INTRAVENOUS | Status: DC

## 2012-01-13 MED ORDER — ALTEPLASE 2 MG IJ SOLR
2.0000 mg | Freq: Once | INTRAMUSCULAR | Status: AC | PRN
  Filled 2012-01-13: qty 2

## 2012-01-13 NOTE — Progress Notes (Signed)
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 currently being managed for consolidation chemotherapy with Vesanoid and arsenic IV she has tolerated treatment.She has intermittent headaches only present in the morning which quickly resolve. No further nausea. She has no other complaints. She denies any mouth pain, shortness of breath or  cough. She has not had any fevers. Blood sugars have been a little bit elevated, as well as her blood pressure.    Objective: Vital signs in last 24 hours: BP 137/84  Pulse 69  Temp(Src) 98.4 F (36.9 C) (Oral)  Resp 20  Ht 5\' 2"  (1.575 m)  Wt 275 lb (124.739 kg)  BMI 50.30 kg/m2  SpO2 98%  LMP 12/28/2011   Physical Exam: 26 y.o.  in no acute distress  A. and O. x3 HEENT: Sclera anicteric. Oral cavity without thrush or lesions. Neck supple.no cervical or supraclavicular adenopathy  Lungs: CTA. No wheezing, rhonchi or rales. No axillary masses. CV regular rate and rhythm normal S1-S2, no murmur , rubs or gallops Abdomen  Obese, soft nontender , bowel sounds x4  no hepatosplenomegaly GU/rectal: deferred. Extremities: no clubbing cyanosis . No edema. No bruising or petechial rash Neurologic: non focal    Lab Results: Labs:  CBC   Lab 01/11/12 1451 01/11/12 0922  WBC 6.6 6.4  HGB 11.2* 11.9  HCT 32.7* 34.8  PLT 214 194  MCV 87.9 90.5  MCH 30.1 30.8  MCHC 34.3 34.1  RDW 15.1 16.7*  LYMPHSABS 1.6 1.4  MONOABS 0.5 0.6  EOSABS 0.0 0.0  BASOSABS 0.0 0.0  BANDABS -- --    CMP    Lab 01/13/12 0535 01/12/12 0610 01/11/12 1451 01/11/12 0924  NA -- 134* 139 140  K 4.1 4.2 3.3* 3.4*  CL -- 100 104 103  CO2 -- 22 24 24   GLUCOSE -- 268* 138* 147*  BUN -- 13 10 9   CREATININE -- 0.58 0.52 0.63  CALCIUM -- 10.5 9.5 9.5  MG 2.2 1.7 1.8 1.7  AST -- 16 16 19   ALT -- 24 23 24   ALKPHOS -- 102 96 101  BILITOT -- 0.7 0.7 0.9        Component Value Date/Time   BILITOT 0.7 01/12/2012 0610    Imaging Studies:  No results  found. Pathology: Microgranular APM L.     Assessment/Plan:  The patient is doing fairly well. Her labs are been reviewed. Potassium and Magnesium levels have normalized after IV dose of magnesium and cutting back on her oral potassium. She began  sliding scale insulin as per diabetes coordinator.  The process of trying to get her for oral medication as this is one of a limiting factor as that is keeping her in the hospital.  Last evening her CBG recorded sugars at 446, today at 198. Social work is working on this issue, anticipating that she will be able to receive oral therapy by tomorrow. No indigent program to supply this medication is available.   Active Problems:  * No active hospital problems. *     LOS: 2 days   [facility] 01/13/2012, 2:27 PM

## 2012-01-13 NOTE — Progress Notes (Signed)
Pharmacy Brief Note: Medication Access  Contacted Ellin Mayhew, Manager of [facility].  He reports that Medicaid will cover oral tretinoin for an outpatient prescription dispensed through their facility once patient is enrolled in [place].     Elie Goody, PharmD, BCPS Pager: [phone] 01/13/2012  4:12 PM

## 2012-01-14 ENCOUNTER — Encounter: Payer: Self-pay | Admitting: Oncology

## 2012-01-14 ENCOUNTER — Ambulatory Visit: Payer: Self-pay

## 2012-01-14 DIAGNOSIS — I10 Essential (primary) hypertension: Secondary | ICD-10-CM

## 2012-01-14 LAB — GLUCOSE, CAPILLARY
Glucose-Capillary: 211 mg/dL — ABNORMAL HIGH (ref 70–99)
Glucose-Capillary: 239 mg/dL — ABNORMAL HIGH (ref 70–99)
Glucose-Capillary: 296 mg/dL — ABNORMAL HIGH (ref 70–99)
Glucose-Capillary: 414 mg/dL — ABNORMAL HIGH (ref 70–99)

## 2012-01-14 MED ORDER — SODIUM CHLORIDE 0.9 % IJ SOLN: 3.0000 mL | INTRAMUSCULAR | Status: DC | PRN

## 2012-01-14 MED ORDER — SODIUM CHLORIDE 0.9 % IV SOLN: Freq: Once | INTRAVENOUS | Status: DC

## 2012-01-14 MED ORDER — SODIUM CHLORIDE 0.9 % IV SOLN
Freq: Once | INTRAVENOUS | Status: AC
  Administered 2012-01-14: 8 mg via INTRAVENOUS
  Filled 2012-01-14: qty 4

## 2012-01-14 MED ORDER — HEPARIN SOD (PORK) LOCK FLUSH 100 UNIT/ML IV SOLN: 250.0000 [IU] | Freq: Once | INTRAVENOUS | Status: AC | PRN

## 2012-01-14 MED ORDER — ALTEPLASE 2 MG IJ SOLR
2.0000 mg | Freq: Once | INTRAMUSCULAR | Status: AC | PRN
  Filled 2012-01-14: qty 2

## 2012-01-14 MED ORDER — SODIUM CHLORIDE 0.9 % IV SOLN
0.1500 mg/kg/d | Freq: Every day | INTRAVENOUS | Status: AC
  Administered 2012-01-14: 12 mg via INTRAVENOUS
  Filled 2012-01-14: qty 12

## 2012-01-14 MED ORDER — SODIUM CHLORIDE 0.9 % IJ SOLN
10.0000 mL | INTRAMUSCULAR | Status: DC | PRN
Start: 2012-01-14 — End: 2012-01-15

## 2012-01-14 MED ORDER — HEPARIN SOD (PORK) LOCK FLUSH 100 UNIT/ML IV SOLN: 500.0000 [IU] | Freq: Once | INTRAVENOUS | Status: AC | PRN

## 2012-01-14 MED ORDER — INSULIN ASPART 100 UNIT/ML ~~LOC~~ SOLN
7.0000 [IU] | Freq: Once | SUBCUTANEOUS | Status: AC
  Administered 2012-01-14: 7 [IU] via SUBCUTANEOUS

## 2012-01-14 NOTE — Progress Notes (Signed)
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 currently being managed for consolidation chemotherapy with Vesanoid and arsenic IV she has tolerated treatment.She has intermittent headaches only present in the morning which quickly resolve. No further nausea. She has no other complaints. She denies any mouth pain, shortness of breath or  cough. She has not had any fevers.blood pressure better within several, blood sugars are also slightly better increase metformin dose.  Objective: Vital signs in last 24 hours: BP 102/65  Pulse 60  Temp(Src) 98.7 F (37.1 C) (Oral)  Resp 18  Ht 5\' 2"  (1.575 m)  Wt 273 lb 6.4 oz (124.013 kg)  BMI 50.01 kg/m2  SpO2 93%  LMP 12/28/2011   Physical Exam: 26 y.o.  in no acute distress  A. and O. x3 HEENT: Sclera anicteric. Oral cavity without thrush or lesions. Neck supple.no cervical or supraclavicular adenopathy  Lungs: CTA. No wheezing, rhonchi or rales. No axillary masses. CV regular rate and rhythm normal S1-S2, no murmur , rubs or gallops Abdomen  Obese, soft nontender , bowel sounds x4  no hepatosplenomegaly GU/rectal: deferred. Extremities: no clubbing cyanosis . No edema. No bruising or petechial rash Neurologic: non focal    Lab Results: Labs:  CBC   Lab 01/13/12 2200 01/11/12 1451 01/11/12 0922  WBC 14.4* 6.6 6.4  HGB 11.6* 11.2* 11.9  HCT 33.3* 32.7* 34.8  PLT 211 214 194  MCV 87.9 87.9 90.5  MCH 30.6 30.1 30.8  MCHC 34.8 34.3 34.1  RDW 15.3 15.1 16.7*  LYMPHSABS -- 1.6 1.4  MONOABS -- 0.5 0.6  EOSABS -- 0.0 0.0  BASOSABS -- 0.0 0.0  BANDABS -- -- --    CMP    Lab 01/13/12 0535 01/12/12 0610 01/11/12 1451 01/11/12 0924  NA -- 134* 139 140  K 4.1 4.2 3.3* 3.4*  CL -- 100 104 103  CO2 -- 22 24 24   GLUCOSE -- 268* 138* 147*  BUN -- 13 10 9   CREATININE -- 0.58 0.52 0.63  CALCIUM -- 10.5 9.5 9.5  MG 2.2 1.7 1.8 1.7  AST -- 16 16 19   ALT -- 24 23 24   ALKPHOS -- 102 96 101  BILITOT -- 0.7 0.7 0.9        Component Value Date/Time   BILITOT 0.7 01/12/2012 0610    Imaging Studies:  No results found. Pathology: Microgranular APM L.     Assessment/Plan:  The patient is doing fairly well. Her labs are been reviewed. Potassium and Magnesium levels have normalized after IV dose of magnesium and cutting back on her oral potassium. She began  sliding scale insulin as per diabetes coordinator.  The process of trying to get her for oral medication as this is one of a limiting factor as that is keeping her in the hospital.  Last evening her CBG recorded sugars at 446, today at 198. She states that her Medicaid may be coming in today and in which case we can hopefully discharge her tomorrow. We have managed to get her Vesanoid from the drug company for 6 months as well. This should be available sometime next week. .Active Problems: APML Hypertension Type 2 DM*     LOS: 3 days   Bubba Vanbenschoten 01/14/2012, 9:12 AM

## 2012-01-14 NOTE — Progress Notes (Signed)
Inpatient Dabetes Program Recommendations  AACE/ADA: New Consensus Statement on Inpatient Glycemic Control (2009)  Target Ranges:  Prepandial:   less than 140 mg/dL      Peak postprandial:   less than 180 mg/dL (1-2 hours)      Critically ill patients:  140 - 180 mg/dL   Reason for Visit: Hyperglycemia in 200's and 300's  Inpatient Diabetes Program Recommendations Insulin - Basal: Please add some basal Lantus, starting with 15 units Lantus daily or HS Correction (SSI): Start Novolog MODERATE scale TID + HS (done, thank you)  Note:Thank you, Lenor Coffin, RN, CNS, Diabetes Coordinator [phone])

## 2012-01-14 NOTE — Progress Notes (Signed)
Clinical Social Worker spoke with pt to offer continued support and address concerns.  Pt informed CSW that she had spoke with her DSS caseworker and her Medicaid application was approved.  Pt was given a Medicaid id number; which she provided to [facility].  CSW contacted RN-Case Manager to inform of pt's insurance status.  Pt is now eligable for Medicaid transportation, and expressed interest in [employer].  CSW will contact [place] to arrange transportation for pt's appointment at [facility] through 02/05/12.  CSW will provide pt with the phone number and information/process for arrange transportation with [facility].  CSW will also provide ACS contact information for [facility].    Tamala Julian, MSW, LCSW Clinical Social Worker [facility] [phone]

## 2012-01-14 NOTE — Progress Notes (Signed)
Patient called today to let us know that her medicaid had been active.Medicaid number [id number] S She also wanted me to let Cammy Copa know that she was trying to reach her. Patient was still in hospital. Patient stated that now that medicaid has been approved she should be able to get medicines. I went to inform Dr. Renelda Loma nurse Benetta Spar she and I agreed that I would call Case worker to verify. Called case worker Ms. Macalroy she verified that patient has medicaid, patient medicaid is valid from 11/2011 to 04/2012. Case worker number [phone]

## 2012-01-15 ENCOUNTER — Ambulatory Visit: Payer: Self-pay

## 2012-01-15 ENCOUNTER — Telehealth: Payer: Self-pay | Admitting: Oncology

## 2012-01-15 ENCOUNTER — Other Ambulatory Visit: Payer: Self-pay | Admitting: Oncology

## 2012-01-15 LAB — COMPREHENSIVE METABOLIC PANEL
Albumin: 4.1 g/dL (ref 3.5–5.2)
BUN: 21 mg/dL (ref 6–23)
Calcium: 9.7 mg/dL (ref 8.4–10.5)
Creatinine, Ser: 0.58 mg/dL (ref 0.50–1.10)
Total Bilirubin: 0.5 mg/dL (ref 0.3–1.2)
Total Protein: 7.1 g/dL (ref 6.0–8.3)

## 2012-01-15 LAB — GLUCOSE, CAPILLARY

## 2012-01-15 LAB — MAGNESIUM: Magnesium: 2.1 mg/dL (ref 1.5–2.5)

## 2012-01-15 MED ORDER — LIDOCAINE-PRILOCAINE 2.5-2.5 % EX CREA: TOPICAL_CREAM | CUTANEOUS | Status: DC | PRN

## 2012-01-15 MED ORDER — SODIUM CHLORIDE 0.9 % IV SOLN
0.1500 mg/kg/d | Freq: Every day | INTRAVENOUS | Status: DC
  Administered 2012-01-15: 12 mg via INTRAVENOUS
  Filled 2012-01-15: qty 12

## 2012-01-15 MED ORDER — BLOOD PRESSURE CONTROL BOOK
Freq: Once | Status: DC
  Filled 2012-01-15: qty 1

## 2012-01-15 MED ORDER — METFORMIN HCL 1000 MG PO TABS: 1000.0000 mg | ORAL_TABLET | Freq: Every day | ORAL | Status: DC

## 2012-01-15 MED ORDER — SODIUM CHLORIDE 0.9 % IV SOLN: Freq: Once | INTRAVENOUS | Status: DC

## 2012-01-15 MED ORDER — TRETINOIN 10 MG PO CAPS: 45.0000 mg/m2/d | ORAL_CAPSULE | Freq: Two times a day (BID) | ORAL | Status: DC

## 2012-01-15 MED ORDER — SODIUM CHLORIDE 0.9 % IJ SOLN: 10.0000 mL | INTRAMUSCULAR | Status: DC | PRN

## 2012-01-15 MED ORDER — HEPARIN SOD (PORK) LOCK FLUSH 100 UNIT/ML IV SOLN
250.0000 [IU] | Freq: Once | INTRAVENOUS | Status: DC | PRN
  Filled 2012-01-15: qty 5

## 2012-01-15 MED ORDER — SODIUM CHLORIDE 0.9 % IJ SOLN: 3.0000 mL | INTRAMUSCULAR | Status: DC | PRN

## 2012-01-15 MED ORDER — ALTEPLASE 2 MG IJ SOLR
2.0000 mg | Freq: Once | INTRAMUSCULAR | Status: DC | PRN
  Filled 2012-01-15: qty 2

## 2012-01-15 MED ORDER — SODIUM CHLORIDE 0.9 % IV SOLN: Freq: Once | INTRAVENOUS | Status: AC

## 2012-01-15 MED ORDER — LISINOPRIL 10 MG PO TABS: 10.0000 mg | ORAL_TABLET | Freq: Every day | ORAL | Status: DC

## 2012-01-15 MED ORDER — HEPARIN SOD (PORK) LOCK FLUSH 100 UNIT/ML IV SOLN
500.0000 [IU] | Freq: Once | INTRAVENOUS | Status: AC | PRN
  Administered 2012-01-15: 500 [IU]

## 2012-01-15 MED ORDER — SODIUM CHLORIDE 0.9 % IV SOLN
Freq: Once | INTRAVENOUS | Status: AC
  Administered 2012-01-15: 8 mg via INTRAVENOUS
  Filled 2012-01-15: qty 4

## 2012-01-15 MED ORDER — MEDROXYPROGESTERONE ACETATE 150 MG/ML IM SUSP
150.0000 mg | Freq: Once | INTRAMUSCULAR | Status: AC
  Administered 2012-01-15: 150 mg via INTRAMUSCULAR
  Filled 2012-01-15: qty 1

## 2012-01-15 MED ORDER — PROMETHAZINE HCL 12.5 MG PO TABS: 12.5000 mg | ORAL_TABLET | Freq: Four times a day (QID) | ORAL | Status: DC | PRN

## 2012-01-15 NOTE — Progress Notes (Signed)
Utilization review completed.  

## 2012-01-15 NOTE — Discharge Summary (Signed)
Physician Discharge Summary  Patient ID: Oneal Deputy M.D. MRN: [id number] DOB/AGE: 24-17-1987 24 y.o.  Admit date: 01/11/2012 Discharge date: 01/15/2012  Admission Diagnoses: Admission for IV chemotherapy and oral chemotherapy  Discharge Diagnoses:  Active Problems:  APL (acute promyelocytic leukemia)   Discharged Condition: good  Discharge Labs:   Significant Diagnostic Studies: labs:   Consults: None  Disposition: 01-Home or Self Care  Treatments: IV hydration, analgesia: iv chemotherapy with arsenic and oral chemotherapy vesanoid  Medication List  As of 01/15/2012  4:48 PM   STOP taking these medications         aspirin 325 MG tablet      medroxyPROGESTERone 10 MG tablet         TAKE these medications         lidocaine-prilocaine cream   Commonly known as: EMLA   Apply topically as needed.      lisinopril 10 MG tablet   Commonly known as: PRINIVIL,ZESTRIL   Take 1 tablet (10 mg total) by mouth daily.      metFORMIN 1000 MG tablet   Commonly known as: GLUCOPHAGE   Take 1 tablet (1,000 mg total) by mouth daily with breakfast.      promethazine 12.5 MG tablet   Commonly known as: PHENERGAN   Take 1 tablet (12.5 mg total) by mouth every 6 (six) hours as needed for nausea.      tretinoin 10 MG capsule   Commonly known as: VESANOID   Take 5 capsules (50 mg total) by mouth 2 (two) times daily.      tretinoin 10 MG capsule   Commonly known as: VESANOID   Take 5 capsules (50 mg total) by mouth 2 (two) times daily with a meal.              Hospital Course: This is a 26 year old woman who was admitted to hospital to facilitate receiving chemotherapy. She had previously been admitted to [facility] where she received induction chemotherapy for 8 PM L. Upon discharge she unfortunately did not have many of the services required and so a Medicaid application was made for her and at the time of admission this has not been fully exercise. She  was therefore admitted to receive her IV and oral chemotherapy. In addition she had been having some issues with poor diabetic control and so this was also needed to be monitored and controlled further. Upon admission she was stable. She did have a baseline EKG which showed a normal QTc interval. Her labs also showed a slightly low potassium and magnesium level and so these were supplemented with oral magnesium and potassium as well as IV bolus. She received her IV arsenic daily as her  oral vesanoid. She tolerated both well and did not develop any significant side effects.  She continue receiving her insulin when necessary for elevated blood sugars per sliding scale, in addition she was receiving daily Provera. She did find out that a Medicaid application had been successfully completed and so it was felt very for discharge on April 26. Arrangements were made for her to resume treatment on April 29 the outpatient clinic. Anastomotic about her blood sugars and her blood pressure and to bring those recordings to the outpatient clinic for further discussion regarding her management of her diabetes and hypertension.  Discharge Exam: Blood pressure 119/74, pulse 68, temperature 98 F (36.7 C), temperature source Oral, resp. rate 18, height 5\' 2"  (1.575 m), weight 273 lb 6.4 oz (124.013  kg), last menstrual period 12/28/2011, SpO2 98.00%. General appearance: alert, cooperative and appears stated age Head: Normocephalic, without obvious abnormality, atraumatic Throat: lips, mucosa, and tongue normal; teeth and gums normal Resp: clear to auscultation bilaterally and normal percussion bilaterally Cardio: regular rate and rhythm, S1, S2 normal, no murmur, click, rub or gallop GI: soft, non-tender; bowel sounds normal; no masses,  no organomegaly Extremities: extremities normal, atraumatic, no cyanosis or edema Pulses: 2+ and symmetric Lymph nodes: Cervical, supraclavicular, and axillary nodes  normal. Neurologic: Grossly normal  Discharge Orders    Future Appointments: Provider: Department: Dept Phone: Center:   01/18/2012 8:15 AM Krista Blue [facility] [phone] None   01/18/2012 8:45 AM [facility] [phone] None   01/18/2012 9:15 AM Amada Kingfisher, PA [facility] [phone] None   01/18/2012 10:30 AM [facility] G22 [facility] [phone] None   01/19/2012 8:30 AM [facility] D11 [facility] [phone] None   01/20/2012 9:15 AM [facility] I26 Dns [facility] [phone] None   01/21/2012 9:15 AM [facility] F20 [facility] [phone] None   01/22/2012 9:45 AM [facility] I27 Dns [facility] [phone] None   01/25/2012 8:15 AM Radene Gunning [facility] [phone] None   01/25/2012 8:45 AM Amada Kingfisher, PA [facility] [phone] None   01/25/2012 9:15 AM [facility] [phone] None   01/25/2012 10:00 AM [facility] E15 [facility] [phone] None   01/26/2012 9:15 AM [facility] E15 [facility] [phone] None   01/27/2012 9:15 AM [facility] I25 Dns [facility] [phone] None   01/28/2012 8:45 AM [facility] F18 [facility] [phone] None   01/29/2012 9:15 AM [facility] A3 [facility] [phone] None   02/01/2012 9:45 AM Beverely Pace Shumate [facility] [phone] None   02/01/2012 10:15 AM [facility] [phone] None   02/01/2012 10:45 AM Amada Kingfisher, PA [facility] [phone] None   02/01/2012 11:30 AM [facility] F19 [facility] [phone] None   02/02/2012 9:00 AM [facility] E14 [facility] [phone] None   02/03/2012 9:15 AM [facility] I25 Dns [facility] [phone] None   02/04/2012 9:15 AM [facility] D11 [facility] [phone] None   02/05/2012 9:15 AM [facility] A2 [facility] [phone] None     Future Orders Please Complete By Expires   PHYSICIAN COMMUNICATION ORDER      Comments:   EKG and corrective measures  for QTc > 500 msec recommended prior to treatment. Repeat potassium, calcium, magnesium, SCr, hematologic and coagulation profiles recommended twice weekly during treatment. Electrolyte replacement recommended for K < 4, Mg < 1.8.   TREATMENT CONDITIONS      Comments:   Notify MD for the following lab values: ANC < 1500, PLT < 100K, Hemoglobin < 8.5, Creatinine > 1.5, K < 4, Mg < 1.8, QTc > 500 msec.  If labs are abnormal OR no lab data is available, MD must be notified and order obtained to begin chemotherapy. Monitor for signs of APL Differentiation Syndrome - unexplained fever, dyspnea, weight gain, pulmonary infiltrates, pleural or pericardial effusions.   TREATMENT CONDITIONS      Comments:   Notify MD for the following lab values: ANC < 1500, PLT < 100K, Hemoglobin < 8.5, Creatinine > 1.5, K < 4, Mg < 1.8, QTc > 500 msec.  If labs are abnormal OR no lab data is available, MD must be notified and order obtained to begin chemotherapy. Monitor for signs of APL Differentiation Syndrome - unexplained fever, dyspnea, weight gain, pulmonary infiltrates, pleural or  pericardial effusions.   Discharge instructions        record blood sugars and blood pressure Notify on-call physicians for any problems including increased temperature, shortness of breath, cough bleeding bruising.  SignedPierce Crane 01/15/2012, 4:48 PM

## 2012-01-15 NOTE — Telephone Encounter (Signed)
Patient called today in reference to her 3.00 co-pay that she was told by nurse in hospital that she would have to pay in order to get meds. Patient stated that she did not have money. I told the patient to not to worry she has a discount that she doesn't have to pay the co-pay. Patient felt better and was very thankful.

## 2012-01-17 ENCOUNTER — Inpatient Hospital Stay (HOSPITAL_COMMUNITY): Payer: Medicaid Other

## 2012-01-17 ENCOUNTER — Encounter (HOSPITAL_COMMUNITY): Payer: Self-pay

## 2012-01-17 ENCOUNTER — Emergency Department (HOSPITAL_COMMUNITY): Payer: Medicaid Other

## 2012-01-17 ENCOUNTER — Inpatient Hospital Stay (HOSPITAL_COMMUNITY)
Admission: EM | Admit: 2012-01-17 | Discharge: 2012-01-21 | DRG: 834 | Disposition: A | Discharge status: Home / Self Care | Payer: Medicaid Other | Attending: Internal Medicine | Admitting: Internal Medicine

## 2012-01-17 DIAGNOSIS — C9240 Acute promyelocytic leukemia, not having achieved remission: Secondary | ICD-10-CM

## 2012-01-17 DIAGNOSIS — Z9221 Personal history of antineoplastic chemotherapy: Secondary | ICD-10-CM

## 2012-01-17 DIAGNOSIS — N12 Tubulo-interstitial nephritis, not specified as acute or chronic: Secondary | ICD-10-CM

## 2012-01-17 DIAGNOSIS — R1011 Right upper quadrant pain: Secondary | ICD-10-CM | POA: Diagnosis present

## 2012-01-17 DIAGNOSIS — K760 Fatty (change of) liver, not elsewhere classified: Secondary | ICD-10-CM

## 2012-01-17 DIAGNOSIS — J189 Pneumonia, unspecified organism: Secondary | ICD-10-CM

## 2012-01-17 DIAGNOSIS — R509 Fever, unspecified: Secondary | ICD-10-CM

## 2012-01-17 DIAGNOSIS — J90 Pleural effusion, not elsewhere classified: Secondary | ICD-10-CM | POA: Diagnosis present

## 2012-01-17 DIAGNOSIS — C9201 Acute myeloblastic leukemia, in remission: Principal | ICD-10-CM | POA: Diagnosis present

## 2012-01-17 DIAGNOSIS — N6009 Solitary cyst of unspecified breast: Secondary | ICD-10-CM

## 2012-01-17 DIAGNOSIS — B961 Klebsiella pneumoniae [K. pneumoniae] as the cause of diseases classified elsewhere: Secondary | ICD-10-CM | POA: Diagnosis present

## 2012-01-17 DIAGNOSIS — T8332XA Displacement of intrauterine contraceptive device, initial encounter: Secondary | ICD-10-CM

## 2012-01-17 DIAGNOSIS — K7689 Other specified diseases of liver: Secondary | ICD-10-CM | POA: Diagnosis present

## 2012-01-17 DIAGNOSIS — N390 Urinary tract infection, site not specified: Secondary | ICD-10-CM

## 2012-01-17 DIAGNOSIS — L2089 Other atopic dermatitis: Secondary | ICD-10-CM

## 2012-01-17 DIAGNOSIS — K8020 Calculus of gallbladder without cholecystitis without obstruction: Secondary | ICD-10-CM | POA: Diagnosis present

## 2012-01-17 DIAGNOSIS — E1165 Type 2 diabetes mellitus with hyperglycemia: Secondary | ICD-10-CM | POA: Diagnosis present

## 2012-01-17 DIAGNOSIS — Z6841 Body Mass Index (BMI) 40.0 and over, adult: Secondary | ICD-10-CM

## 2012-01-17 DIAGNOSIS — I10 Essential (primary) hypertension: Secondary | ICD-10-CM

## 2012-01-17 DIAGNOSIS — E119 Type 2 diabetes mellitus without complications: Secondary | ICD-10-CM

## 2012-01-17 DIAGNOSIS — L0291 Cutaneous abscess, unspecified: Secondary | ICD-10-CM

## 2012-01-17 DIAGNOSIS — R5081 Fever presenting with conditions classified elsewhere: Secondary | ICD-10-CM | POA: Diagnosis present

## 2012-01-17 HISTORY — DX: Fatty (change of) liver, not elsewhere classified: K76.0

## 2012-01-17 LAB — GLUCOSE, CAPILLARY
Glucose-Capillary: 268 mg/dL — ABNORMAL HIGH (ref 70–99)
Glucose-Capillary: 241 mg/dL — ABNORMAL HIGH (ref 70–99)
Glucose-Capillary: 219 mg/dL — ABNORMAL HIGH (ref 70–99)

## 2012-01-17 LAB — CBC
HCT: 32.3 % — ABNORMAL LOW (ref 36.0–46.0)
Hemoglobin: 11.1 g/dL — ABNORMAL LOW (ref 12.0–15.0)
MCHC: 34.4 g/dL (ref 30.0–36.0)
WBC: 14.9 10*3/uL — ABNORMAL HIGH (ref 4.0–10.5)

## 2012-01-17 LAB — COMPREHENSIVE METABOLIC PANEL
ALT: 16 U/L (ref 0–35)
Albumin: 3.7 g/dL (ref 3.5–5.2)
Alkaline Phosphatase: 105 U/L (ref 39–117)
BUN: 21 mg/dL (ref 6–23)
Chloride: 99 mEq/L (ref 96–112)
GFR calc Af Amer: >90 mL/min (ref >90)
Glucose, Bld: 274 mg/dL — ABNORMAL HIGH (ref 70–99)
Potassium: 3.6 mEq/L (ref 3.5–5.1)
Sodium: 133 mEq/L — ABNORMAL LOW (ref 135–145)
Total Bilirubin: 0.5 mg/dL (ref 0.3–1.2)
Total Protein: 6.4 g/dL (ref 6.0–8.3)

## 2012-01-17 LAB — URINALYSIS, ROUTINE W REFLEX MICROSCOPIC
Bilirubin Urine: NEGATIVE
Glucose, UA: 500 mg/dL — AB
Ketones, ur: NEGATIVE mg/dL
Protein, ur: NEGATIVE mg/dL
pH: 6 (ref 5.0–8.0)

## 2012-01-17 LAB — URINE MICROSCOPIC-ADD ON

## 2012-01-17 MED ORDER — IOHEXOL 300 MG/ML  SOLN
100.0000 mL | Freq: Once | INTRAMUSCULAR | Status: AC | PRN
  Administered 2012-01-17: 100 mL via INTRAVENOUS

## 2012-01-17 MED ORDER — HYDROMORPHONE HCL PF 1 MG/ML IJ SOLN
1.0000 mg | Freq: Once | INTRAMUSCULAR | Status: AC
  Administered 2012-01-17: 1 mg via INTRAVENOUS
  Filled 2012-01-17: qty 1

## 2012-01-17 MED ORDER — MORPHINE SULFATE 2 MG/ML IJ SOLN
2.0000 mg | INTRAMUSCULAR | Status: DC | PRN
  Administered 2012-01-18: 2 mg via INTRAVENOUS
  Filled 2012-01-17: qty 1

## 2012-01-17 MED ORDER — INSULIN ASPART 100 UNIT/ML ~~LOC~~ SOLN
0.0000 [IU] | Freq: Every day | SUBCUTANEOUS | Status: DC
  Administered 2012-01-17: 2 [IU] via SUBCUTANEOUS
  Administered 2012-01-18 – 2012-01-19 (×2): 3 [IU] via SUBCUTANEOUS

## 2012-01-17 MED ORDER — TECHNETIUM TC 99M MEBROFENIN IV KIT
5.5000 | PACK | Freq: Once | INTRAVENOUS | Status: AC | PRN
  Administered 2012-01-17: 5.5 via INTRAVENOUS

## 2012-01-17 MED ORDER — INSULIN GLARGINE 100 UNIT/ML ~~LOC~~ SOLN
10.0000 [IU] | Freq: Every day | SUBCUTANEOUS | Status: DC
  Administered 2012-01-17: 10 [IU] via SUBCUTANEOUS

## 2012-01-17 MED ORDER — INSULIN ASPART 100 UNIT/ML ~~LOC~~ SOLN
0.0000 [IU] | SUBCUTANEOUS | Status: DC
  Administered 2012-01-17: 5 [IU] via SUBCUTANEOUS
  Filled 2012-01-17: qty 1

## 2012-01-17 MED ORDER — OXYCODONE HCL 5 MG PO TABS
5.0000 mg | ORAL_TABLET | ORAL | Status: DC | PRN
  Administered 2012-01-17 – 2012-01-18 (×2): 5 mg via ORAL
  Filled 2012-01-17 (×2): qty 1

## 2012-01-17 MED ORDER — ACETAMINOPHEN 650 MG RE SUPP: 650.0000 mg | Freq: Four times a day (QID) | RECTAL | Status: DC | PRN

## 2012-01-17 MED ORDER — SODIUM CHLORIDE 0.9 % IV SOLN
INTRAVENOUS | Status: DC
  Administered 2012-01-17: 10:00:00 via INTRAVENOUS
  Administered 2012-01-17: 125 mL/h via INTRAVENOUS
  Administered 2012-01-18: 75 mL/h via INTRAVENOUS
  Administered 2012-01-18 – 2012-01-19 (×3): via INTRAVENOUS

## 2012-01-17 MED ORDER — LISINOPRIL 10 MG PO TABS
10.0000 mg | ORAL_TABLET | Freq: Every day | ORAL | Status: DC
Start: 2012-01-17 — End: 2012-01-21
  Administered 2012-01-17 – 2012-01-21 (×5): 10 mg via ORAL
  Filled 2012-01-17 (×5): qty 1

## 2012-01-17 MED ORDER — ONDANSETRON HCL 4 MG/2ML IJ SOLN
4.0000 mg | Freq: Once | INTRAMUSCULAR | Status: AC
  Administered 2012-01-17: 4 mg via INTRAVENOUS
  Filled 2012-01-17: qty 2

## 2012-01-17 MED ORDER — SODIUM CHLORIDE 0.9 % IV BOLUS (SEPSIS)
1000.0000 mL | Freq: Once | INTRAVENOUS | Status: AC
  Administered 2012-01-17: 1000 mL via INTRAVENOUS

## 2012-01-17 MED ORDER — ONDANSETRON HCL 4 MG/2ML IJ SOLN
4.0000 mg | Freq: Four times a day (QID) | INTRAMUSCULAR | Status: DC | PRN
  Administered 2012-01-17 – 2012-01-20 (×5): 4 mg via INTRAVENOUS
  Filled 2012-01-17 (×5): qty 2

## 2012-01-17 MED ORDER — INSULIN ASPART 100 UNIT/ML ~~LOC~~ SOLN
0.0000 [IU] | Freq: Three times a day (TID) | SUBCUTANEOUS | Status: DC
  Administered 2012-01-17: 5 [IU] via SUBCUTANEOUS
  Administered 2012-01-17: 3 [IU] via SUBCUTANEOUS
  Administered 2012-01-18: 5 [IU] via SUBCUTANEOUS
  Administered 2012-01-18: 3 [IU] via SUBCUTANEOUS
  Administered 2012-01-18: 5 [IU] via SUBCUTANEOUS
  Administered 2012-01-19: 8 [IU] via SUBCUTANEOUS
  Administered 2012-01-19: 3 [IU] via SUBCUTANEOUS
  Administered 2012-01-19: 5 [IU] via SUBCUTANEOUS
  Administered 2012-01-20: 2 [IU] via SUBCUTANEOUS

## 2012-01-17 MED ORDER — PROMETHAZINE HCL 25 MG PO TABS
12.5000 mg | ORAL_TABLET | Freq: Four times a day (QID) | ORAL | Status: DC | PRN
  Administered 2012-01-18 – 2012-01-19 (×4): 12.5 mg via ORAL
  Filled 2012-01-17 (×4): qty 1

## 2012-01-17 MED ORDER — ONDANSETRON HCL 4 MG PO TABS: 4.0000 mg | ORAL_TABLET | Freq: Four times a day (QID) | ORAL | Status: DC | PRN

## 2012-01-17 MED ORDER — LIDOCAINE-PRILOCAINE 2.5-2.5 % EX CREA
TOPICAL_CREAM | CUTANEOUS | Status: DC | PRN
  Filled 2012-01-17: qty 5

## 2012-01-17 MED ORDER — ACETAMINOPHEN 325 MG PO TABS
650.0000 mg | ORAL_TABLET | Freq: Once | ORAL | Status: AC
  Administered 2012-01-17: 650 mg via ORAL
  Filled 2012-01-17: qty 2

## 2012-01-17 MED ORDER — DEXTROSE 5 % IV SOLN
1.0000 g | Freq: Once | INTRAVENOUS | Status: AC
  Administered 2012-01-17: 1 g via INTRAVENOUS
  Filled 2012-01-17: qty 10

## 2012-01-17 MED ORDER — ACETAMINOPHEN 325 MG PO TABS
650.0000 mg | ORAL_TABLET | Freq: Four times a day (QID) | ORAL | Status: DC | PRN
  Administered 2012-01-17 – 2012-01-18 (×2): 650 mg via ORAL
  Filled 2012-01-17 (×2): qty 2

## 2012-01-17 MED ORDER — PIPERACILLIN-TAZOBACTAM 3.375 G IVPB
3.3750 g | Freq: Three times a day (TID) | INTRAVENOUS | Status: DC
  Administered 2012-01-17 – 2012-01-21 (×11): 3.375 g via INTRAVENOUS
  Filled 2012-01-17 (×12): qty 50

## 2012-01-17 MED ORDER — PIPERACILLIN-TAZOBACTAM 3.375 G IVPB 30 MIN
3.3750 g | Freq: Once | INTRAVENOUS | Status: AC
  Administered 2012-01-17: 3.375 g via INTRAVENOUS
  Filled 2012-01-17: qty 50

## 2012-01-17 NOTE — ED Provider Notes (Addendum)
History     CSN: [id number]  Arrival date & time 01/17/12  4098   First MD Initiated Contact with Patient 01/17/12 [phone]      Chief Complaint  Patient presents with  . Cancer  . Fever    (Consider location/radiation/quality/duration/timing/severity/associated sxs/prior treatment) Patient is a 26 y.o. female presenting with fever. The history is provided by the patient.  Fever Primary symptoms of the febrile illness include fever and abdominal pain. Primary symptoms do not include headaches, shortness of breath, dysuria or rash.   patient with leukemia last chemotherapy 2 days ago. Today he developed fever to 101.1 home. Patient has also developed right-sided abdominal pain since yesterday.  new nausea vomiting or diarrhea. No cough or shortness of breath. No rash. No dysuria urgency or frequency. No history of abdominal surgeries. Moderate in severity. Pain is sharp in quality and not radiating, seems to be worse with any kind of movement.  Past Medical History  Diagnosis Date  . Diabetes mellitus   . Hypertension   . Nephrolithiasis   . Leukemia     in remission 12-24-11  . Leukemia   . Leukemia     Past Surgical History  Procedure Date  . Portacath placement     [facility]    Family History  Problem Relation Age of Onset  . Kidney disease Mother   . Kidney disease Maternal Grandmother   . Diabetes Maternal Grandmother     History  Substance Use Topics  . Smoking status: Never Smoker   . Smokeless tobacco: Not on file  . Alcohol Use: No    OB History    Grav Para Term Preterm Abortions TAB SAB Ect Mult Living                  Review of Systems  Constitutional: Positive for fever. Negative for chills.  HENT: Negative for neck pain and neck stiffness.   Eyes: Negative for pain.  Respiratory: Negative for shortness of breath.   Cardiovascular: Negative for chest pain.  Gastrointestinal: Positive for abdominal pain. Negative for constipation and blood in stool.    Genitourinary: Negative for dysuria.  Musculoskeletal: Negative for back pain.  Skin: Negative for rash.  Neurological: Negative for headaches.  All other systems reviewed and are negative.    Allergies  Ultram  Home Medications   Current Outpatient Rx  Name Route Sig Dispense Refill  . LIDOCAINE-PRILOCAINE 2.5-2.5 % EX CREA Topical Apply topically as needed. 30 g 0  . LISINOPRIL 10 MG PO TABS Oral Take 1 tablet (10 mg total) by mouth daily. 30 tablet 4  . METFORMIN HCL 1000 MG PO TABS Oral Take 1 tablet (1,000 mg total) by mouth daily with breakfast. 90 tablet 4  . PROMETHAZINE HCL 12.5 MG PO TABS Oral Take 1 tablet (12.5 mg total) by mouth every 6 (six) hours as needed for nausea. 30 tablet 4  . TRETINOIN 10 MG PO CAPS Oral Take 5 capsules (50 mg total) by mouth 2 (two) times daily.    . TRETINOIN 10 MG PO CAPS Oral Take 5 capsules (50 mg total) by mouth 2 (two) times daily with a meal. 100 capsule 4    BP 141/91  Pulse 119  Temp(Src) 100.6 F (38.1 C) (Oral)  Resp 22  Ht 5\' 2"  (1.575 m)  Wt 275 lb (124.739 kg)  BMI 50.30 kg/m2  SpO2 99%  LMP 12/28/2011  Physical Exam  Constitutional: She is oriented to person, place, and time. She  appears well-developed and well-nourished.  HENT:  Head: Normocephalic and atraumatic.  Eyes: Conjunctivae and EOM are normal. Pupils are equal, round, and reactive to light.  Neck: Trachea normal. Neck supple. No thyromegaly present.  Cardiovascular: Normal rate, regular rhythm, S1 normal, S2 normal and normal pulses.     No systolic murmur is present   No diastolic murmur is present  Pulses:      Radial pulses are 2+ on the right side, and 2+ on the left side.  Pulmonary/Chest: Effort normal and breath sounds normal. She has no wheezes. She has no rhonchi. She has no rales. She exhibits no tenderness.  Abdominal: Soft. Normal appearance and bowel sounds are normal. She exhibits no distension. There is tenderness. There is no rebound, no  guarding, no CVA tenderness and negative Murphy's sign.       Right upper quadrant and right lower quadrant abdominal pain without peritonitis  Musculoskeletal:       BLE:s Calves nontender, no cords or erythema, negative Homans sign  Neurological: She is alert and oriented to person, place, and time. She has normal strength. No cranial nerve deficit or sensory deficit. GCS eye subscore is 4. GCS verbal subscore is 5. GCS motor subscore is 6.  Skin: Skin is warm and dry. No rash noted. She is not diaphoretic.  Psychiatric: Her speech is normal.       Cooperative and appropriate    ED Course  Procedures (including critical care time)  Labs Reviewed  CBC - Abnormal; Notable for the following:    WBC 14.9 (*)    RBC 3.66 (*)    Hemoglobin 11.1 (*)    HCT 32.3 (*)    All other components within normal limits  COMPREHENSIVE METABOLIC PANEL - Abnormal; Notable for the following:    Sodium 133 (*)    Glucose, Bld 274 (*)    All other components within normal limits  URINALYSIS, ROUTINE W REFLEX MICROSCOPIC - Abnormal; Notable for the following:    APPearance CLOUDY (*)    Glucose, UA 500 (*)    Hgb urine dipstick TRACE (*)    Leukocytes, UA SMALL (*)    All other components within normal limits  URINE MICROSCOPIC-ADD ON - Abnormal; Notable for the following:    Squamous Epithelial / LPF MANY (*)    Bacteria, UA MANY (*)    All other components within normal limits  GLUCOSE, CAPILLARY - Abnormal; Notable for the following:    Glucose-Capillary 268 (*)    All other components within normal limits  URINE CULTURE   Dg Chest 2 View  01/17/2012  *RADIOLOGY REPORT*  Clinical Data: Fever, difficulty breathing.  CHEST - 2 VIEW  Comparison: 07/21/2006  Findings: Hypoaeration results in interstitial and vascular crowding.  Mild streaky opacity at the lung bases.  Right chest wall Port-A-Cath with tip projecting over the proximal SVC.  Heart size upper normal limits.  No pleural effusion or  pneumothorax.  No acute osseous abnormality.  IMPRESSION: Mild streaky opacity at the lung bases; atelectasis versus infiltrate.  Hypoaeration results in interstitial and vascular crowding.  Original Report Authenticated By: Waneta Martins, M.D.   Ct Abdomen Pelvis W Contrast  01/17/2012  *RADIOLOGY REPORT*  Clinical Data: Right-sided abdominal pain, fever.  CT ABDOMEN AND PELVIS WITH CONTRAST  Technique:  Multidetector CT imaging of the abdomen and pelvis was performed following the standard protocol during bolus administration of intravenous contrast.  Contrast: OMNIPAQUE IOHEXOL 300 MG/ML  SOLN  Comparison: 01/17/2012 ultrasound, 11/03/2008 CT  Findings: Limited images through the lung bases demonstrate no significant appreciable abnormality. The heart size is within normal limits. No pleural or pericardial effusion.  Hepatic steatosis.  Cholelithiasis.  No gallbladder wall thickening or pericholecystic fluid.  Unremarkable spleen, pancreas, adrenal glands.  Symmetric renal enhancement.  No hydronephrosis or hydroureter.  No bowel obstruction.  No CT evidence for colitis.  Normal appendix.  No free intraperitoneal air. Within the pelvis, there is trace free fluid.  No lymphadenopathy.  Thin-walled bladder.  Unremarkable CT appearance to the uterus and adnexa.  No acute osseous abnormality.  IMPRESSION: Cholelithiasis without CT evidence for acute cholecystitis.  HIDA scan is the study of choice if clinical concern for acute cholecystitis persists.  Hepatic steatosis.  Normal appendix.  Original Report Authenticated By: Waneta Martins, M.D.   US Abdomen Limited  01/17/2012  *RADIOLOGY REPORT*  Clinical Data:  Right-sided abdominal pain and fever.  LIMITED ABDOMINAL ULTRASOUND - RIGHT UPPER QUADRANT  Comparison:   11/03/2008 CT  Findings:  Gallbladder:  Partially contracted containing multiple echogenic shadowing foci.  No gallbladder wall thickening or pericholecystic fluid.  Common bile duct:   4 mm in diameter, normal appearance.  Liver:  Heterogeneous echogenicity suggests areas of fatty infiltration.  No focal abnormality identified.  IMPRESSION: Cholelithiasis without gallbladder wall thickening or pericholecystic fluid. HIDA scan could be obtained to evalute for acute cholecystitis if clinically warranted.  Hepatic steatosis.                   Original Report Authenticated By: Waneta Martins, M.D.   IV fluids. Tylenol. Pain medications provided. CT scan ultrasound obtained and reviewed as above. No appendicitis, possible cholecystitis without overwhelming radiographic evidence. UA reviewed and does have mild UTI but no UTI symptoms. Urine culture sent   7:26 AM d/w FP resident on call, accepts in Tx to [facility] FP service. Plan Gen. surgery consult on arrival to [facility] cone to further evaluate gallbladder. Resident requesting discussion with oncology prior to transfer.   MDM   Fever and right-sided abdominal pain in a patient with leukemia and recent chemotherapy. No neutropenia. UTI present on urinalysis without UTI symptoms of the fever. Patient has gallstones and symptoms may represent cholecystitis. Plan medical admission with transfer to [facility] cone, general surgery to be consulted.         Sunnie Nielsen, MD 01/17/12 [phone]  Case discussed with Dr. Donnie Coffin who is patient's oncologist. He highly recommends patient be admitted to [facility]. I discussed case with triad hospitalist who agrees to admission and will evaluate patient in the ED.  Sunnie Nielsen, MD 01/17/12 [phone]

## 2012-01-17 NOTE — H&P (Signed)
Hospital Admission Note Date: 01/17/2012  Patient name: Melissa Washington Medical record number: [id number] Date of birth: 06/05/1986 Age: 26 y.o. Gender: female PCP: Edd Arbour, MD, MD Oncologist: Dr. Pierce Crane  Attending physician: Maryruth Bun Ciella Obi, MD Emergency Contact:  Maddyn Lieurance (mother) [phone] Code Status:  Full  Chief Complaint: Fever and abdominal pain.  History of Present Illness: Melissa Washington is an 26 y.o. female with a PMH of APL under the care of Dr. Donnie Coffin for consolidation chemotherapy who presented to the hospital with a chief complaint of fever that began early this morning.  No associated dyspnea, cough, dysuria, but does have RLQ abdominal pain.  Pain in abdomen is rated 8-9 / 10 at its worst and described as sharp and constant in intensity.  Coughing, deep breathing and sneezing make the pain worse.  Nothing except pain medication eases the pain off.  Upon initial evaluation emergency department, the patient has findings on radiography that show cholelithiasis without inflammation of the gallbladder wall. Nevertheless, she is tender in the abdomen and this is the most likely source of infection. Surgical consultation was requested by the emergency department physician. She was then referred to internal medicine for further evaluation and admission.  Past Medical History Past Medical History  Diagnosis Date  . Diabetes mellitus   . Hypertension   . Nephrolithiasis   . 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 Hepatic steatosis 01/17/2012  . Cholelithiasis 01/17/2012    Past Surgical History Past Surgical History  Procedure Date  . Portacath placement     [facility]    Meds: Prior to Admission medications   Medication Sig Start Date End Date Taking? Authorizing Provider  lidocaine-prilocaine (EMLA) cream Apply topically as needed. 01/15/12 01/14/13 Yes Pierce Crane, MD  lisinopril (PRINIVIL,ZESTRIL) 10 MG tablet Take 1 tablet (10 mg  total) by mouth daily. 01/15/12 01/14/13 Yes Pierce Crane, MD  metFORMIN (GLUCOPHAGE) 1000 MG tablet Take 1 tablet (1,000 mg total) by mouth daily with breakfast. 01/15/12 01/14/13 Yes Pierce Crane, MD  promethazine (PHENERGAN) 12.5 MG tablet Take 1 tablet (12.5 mg total) by mouth every 6 (six) hours as needed for nausea. 01/15/12 01/22/12 Yes Pierce Crane, MD  tretinoin (VESANOID) 10 MG capsule Take 5 capsules (50 mg total) by mouth 2 (two) times daily. 01/11/12 01/25/12 Yes Pierce Crane, MD  tretinoin (VESANOID) 10 MG capsule Take 5 capsules (50 mg total) by mouth 2 (two) times daily with a meal. 01/15/12 01/29/12 Yes Pierce Crane, MD    Allergies: Ultram  Social History: History   Social History  . Marital Status: Single    Spouse Name: N/A    Number of Children: 1  . Years of Education: 13   Occupational History  . Unemployed   . Personal care aide in past    Social History Main Topics  . Smoking status: Never Smoker   . Smokeless tobacco: Not on file  . Alcohol Use: No  . Drug Use: No  . Sexually Active: Yes    Birth Control/ Protection: None   Other Topics Concern  . Not on file   Social History Narrative   Single, lives with her daughter in [place].    Family History:  Family History  Problem Relation Age of Onset  . Kidney disease Mother   . Kidney disease Maternal Grandmother   . Diabetes Maternal Grandmother   . Hypertension Mother   . Diabetes Father     Review of Systems: Constitutional: +  fever and chills;  Appetite normal; No weight loss.  HEENT: No blurry vision or diplopia, no pharyngitis or dysphagia CV: No chest pain or arrhythmia.  Resp: No SOB, no cough. GI: No N/V, no diarrhea, no melena or hematochezia.  GU: No dysuria or hematuria.  MSK: no myalgias/arthralgias.  Neuro:  +headache with light sensitivity at times, none at present, no focal neurological deficits.  Psych: No depression or anxiety.  Endo: No thyroid disease, +DM.  Skin: No rashes or lesions.   Heme: +APL diagnosed within last month.   Physical Exam: Blood pressure 122/59, pulse 98, temperature 98.7 F (37.1 C), temperature source Oral, resp. rate 33, height 5\' 2"  (1.575 m), weight 124.739 kg (275 lb), last menstrual period 12/28/2011, SpO2 98.00%. BP 122/59  Pulse 98  Temp(Src) 98.7 F (37.1 C) (Oral)  Resp 33  Ht 5\' 2"  (1.575 m)  Wt 124.739 kg (275 lb)  BMI 50.30 kg/m2  SpO2 98%  LMP 12/28/2011  General Appearance:    Alert, cooperative, no distress, appears stated age, obese  Head:    Normocephalic, without obvious abnormality, atraumatic  Eyes:    PERRL, conjunctiva/corneas clear, EOM's intact  Ears:    Normal external ear canals, both ears  Nose:   Nares normal, septum midline, mucosa normal, no drainage    or sinus tenderness  Throat:   Lips, mucosa, and tongue normal; teeth and gums normal  Neck:   Supple, symmetrical, trachea midline, no adenopathy;    thyroid:  no enlargement/tenderness/nodules; no carotid   bruit or JVD  Back:     Symmetric, no curvature, ROM normal, no CVA tenderness  Lungs:     Diminished bilaterally, respirations unlabored  Chest Wall:    No tenderness or deformity. Porta-cath right upper chest wall without local erythema or signs of infection.   Heart:    Regular rate and rhythm, S1 and S2 normal, no murmur, rub   or gallop  Abdomen:     Soft, tender RUQ, RLQ, bowel sounds active all four quadrants, no masses, no organomegaly, no guarding  Extremities:   Extremities normal, atraumatic, no cyanosis or edema  Pulses:   2+ and symmetric all extremities  Skin:   Skin color, texture, turgor normal, no rashes or lesions  Lymph nodes:   Cervical, supraclavicular, and axillary nodes normal  Neurologic:   Non-focal   Lab results: Basic Metabolic Panel:  Lab 01/17/12 1610 01/15/12 0535 01/13/12 0535 01/12/12 0610 01/11/12 1451 01/11/12 0924  NA 133* 138 -- 134* 139 140  K 3.6 4.1 -- -- -- --  CL 99 100 -- 100 104 103  CO2 21 25 -- 22 24 24     GLUCOSE 274* 238* -- 268* 138* 147*  BUN 21 21 -- 13 10 9   CREATININE 0.59 0.58 -- 0.58 0.52 0.63  CALCIUM 8.8 9.7 -- 10.5 9.5 9.5  MG -- 2.1 2.2 1.7 1.8 1.7  PHOS -- -- -- -- -- --   GFR Estimated Creatinine Clearance: 138 ml/min (by C-G formula based on Cr of 0.59). Liver Function Tests:  Lab 01/17/12 0435 01/15/12 0535 01/12/12 0610 01/11/12 1451 01/11/12 0924  AST 19 10 16 16 19   ALT 16 14 24 23 24   ALKPHOS 105 110 102 96 101  BILITOT 0.5 0.5 0.7 0.7 0.9  PROT 6.4 7.1 7.4 7.1 7.1  ALBUMIN 3.7 4.1 4.4 4.2 4.4   Coagulation profile  Lab 01/13/12 2200  INR 1.12  PROTIME --    CBC:  Lab  01/17/12 0435 01/13/12 2200 01/11/12 1451 01/11/12 0922  WBC 14.9* 14.4* 6.6 6.4  NEUTROABS -- -- 4.6 4.3  HGB 11.1* 11.6* 11.2* 11.9  HCT 32.3* 33.3* 32.7* 34.8  MCV 88.3 87.9 87.9 90.5  PLT 214 211 214 194   CBG:  Lab 01/17/12 0507 01/15/12 1655 01/15/12 1147 01/15/12 0741 01/15/12 0511  GLUCAP 268* 316* 199* 206* 250*    Imaging results:   Dg Chest 2 View 01/17/2012  *RADIOLOGY REPORT*  Clinical Data: Fever, difficulty breathing.  CHEST - 2 VIEW  Comparison: 07/21/2006  Findings: Hypoaeration results in interstitial and vascular crowding.  Mild streaky opacity at the lung bases.  Right chest wall Port-A-Cath with tip projecting over the proximal SVC.  Heart size upper normal limits.  No pleural effusion or pneumothorax.  No acute osseous abnormality.  IMPRESSION: Mild streaky opacity at the lung bases; atelectasis versus infiltrate.  Hypoaeration results in interstitial and vascular crowding.  Original Report Authenticated By: Waneta Martins, M.D.    Ct Abdomen Pelvis W Contrast 01/17/2012  *RADIOLOGY REPORT*  Clinical Data: Right-sided abdominal pain, fever.  CT ABDOMEN AND PELVIS WITH CONTRAST  Technique:  Multidetector CT imaging of the abdomen and pelvis was performed following the standard protocol during bolus administration of intravenous contrast.  Contrast:  OMNIPAQUE IOHEXOL 300 MG/ML  SOLN  Comparison: 01/17/2012 ultrasound, 11/03/2008 CT  Findings: Limited images through the lung bases demonstrate no significant appreciable abnormality. The heart size is within normal limits. No pleural or pericardial effusion.  Hepatic steatosis.  Cholelithiasis.  No gallbladder wall thickening or pericholecystic fluid.  Unremarkable spleen, pancreas, adrenal glands.  Symmetric renal enhancement.  No hydronephrosis or hydroureter.  No bowel obstruction.  No CT evidence for colitis.  Normal appendix.  No free intraperitoneal air. Within the pelvis, there is trace free fluid.  No lymphadenopathy.  Thin-walled bladder.  Unremarkable CT appearance to the uterus and adnexa.  No acute osseous abnormality.  IMPRESSION: Cholelithiasis without CT evidence for acute cholecystitis.  HIDA scan is the study of choice if clinical concern for acute cholecystitis persists.  Hepatic steatosis.  Normal appendix.  Original Report Authenticated By: Waneta Martins, M.D.    US Abdomen Limited 01/17/2012  *RADIOLOGY REPORT*  Clinical Data:  Right-sided abdominal pain and fever.  LIMITED ABDOMINAL ULTRASOUND - RIGHT UPPER QUADRANT  Comparison:   11/03/2008 CT  Findings:  Gallbladder:  Partially contracted containing multiple echogenic shadowing foci.  No gallbladder wall thickening or pericholecystic fluid.  Common bile duct:  4 mm in diameter, normal appearance.  Liver:  Heterogeneous echogenicity suggests areas of fatty infiltration.  No focal abnormality identified.  IMPRESSION: Cholelithiasis without gallbladder wall thickening or pericholecystic fluid. HIDA scan could be obtained to evalute for acute cholecystitis if clinically warranted.  Hepatic steatosis.                   Original Report Authenticated By: Waneta Martins, M.D.    Assessment & Plan: Principal Problem:  *Fever in a patient with APML and Cholelithiasis  Blood cultures, urine cultures obtained.  Status post CT scan  and abdominal ultrasound which showed cholelithiasis without gallbladder wall thickening.  Clinical exam shows right upper and lower quadrant pain, concerning for cholecystitis.  Keep patient n.p.o. until acute cholecystitis ruled out.  HIDA scan ordered.  Start empiric Zosyn to cover intra-abdominal pathogens.  Dr. Donnie Coffin notified of admission. Active Problems:  DIABETES-TYPE 2  Hemoglobin A1c on 11/25/2011 was 7.7%.  Hold metformin.  Start  moderate scale sliding scale insulin every 4 hours while n.p.o.  HYPERTENSION, BENIGN ESSENTIAL  Continue lisinopril.  Hepatic steatosis  Recommend weight loss.  Request a dietitian evaluation for diet education.   Prophylaxis: PAS/TEDs hoses  Time Spent On Admission: 50 minutes.  Mikaylah Libbey 01/17/2012, 9:33 AM Pager [phone]

## 2012-01-17 NOTE — ED Notes (Signed)
Lurena Joiner RN called to be updated on pt's status. Pt to floor at this time.

## 2012-01-17 NOTE — ED Notes (Signed)
Transported to Nuclear Med via Doctor, general practice.

## 2012-01-17 NOTE — ED Notes (Signed)
D/C from [facility] Friday. Pt c/o of fever of 101 oral at 1am. Pt has Leukemia. Pt c/o of Right lower quad pain that started this am. Denies nausea and vomiting

## 2012-01-17 NOTE — ED Notes (Signed)
Pt returned from Nuclear Medicine

## 2012-01-17 NOTE — ED Notes (Signed)
Hospitalist at bedside 

## 2012-01-17 NOTE — ED Notes (Signed)
Patient in radiology will do cbg and vitals when patient return

## 2012-01-17 NOTE — Progress Notes (Signed)
ANTIBIOTIC CONSULT NOTE - INITIAL  Pharmacy Consult for Zosyn Indication: r/o cholecystitis  Allergies  Allergen Reactions  . Ultram (Tramadol Hcl) Nausea And Vomiting    Patient Measurements: Height: 5\' 2"  (157.5 cm) Weight: 275 lb (124.739 kg) IBW/kg (Calculated) : 50.1   Vital Signs: Temp: 98.7 F (37.1 C) (04/28 0807) Temp src: Oral (04/28 0807) BP: 122/59 mmHg (04/28 0807) Pulse Rate: 98  (04/28 0807) Intake/Output from previous day:   Intake/Output from this shift:    Labs:  Basename 01/17/12 0435 01/15/12 0535  WBC 14.9* --  HGB 11.1* --  PLT 214 --  LABCREA -- --  CREATININE 0.59 0.58   Estimated Creatinine Clearance: 138 ml/min (by C-G formula based on Cr of 0.59). No results found for this basename: VANCOTROUGH:2,VANCOPEAK:2,VANCORANDOM:2,GENTTROUGH:2,GENTPEAK:2,GENTRANDOM:2,TOBRATROUGH:2,TOBRAPEAK:2,TOBRARND:2,AMIKACINPEAK:2,AMIKACINTROU:2,AMIKACIN:2, in the last 72 hours   Microbiology: Recent Results (from the past 720 hour(s))  WET PREP, GENITAL     Status: Abnormal   Collection Time   12/27/11  2:27 PM      Component Value Range Status Comment   Yeast Wet Prep HPF POC NONE SEEN  NONE SEEN  Final    Trich, Wet Prep NONE SEEN  NONE SEEN  Final    Clue Cells Wet Prep HPF POC NONE SEEN  NONE SEEN  Final    WBC, Wet Prep HPF POC FEW (*) NONE SEEN  Final     Medical History: Past Medical History  Diagnosis Date  . Diabetes mellitus   . Hypertension   . Nephrolithiasis   . 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 Hepatic steatosis 01/17/2012  . Cholelithiasis 01/17/2012   Assessment:  25 YOF with acute promyelocytic leukemia discharged from 4/26 s/p arsenic and po tretinoin presented 4/28 c/o fever and abdominal pain.  Pt has gallstones and symptoms may represent cholecystitis, starting Zosyn empirically.  Surgery consulted.  Pending blood/urine cultures  Good renal function  Plan:   Zosyn 3.375 gm IV q8h  Pharmacy will f/u  daily  Geoffry Paradise Thi 01/17/2012,10:04 AM

## 2012-01-18 ENCOUNTER — Inpatient Hospital Stay (HOSPITAL_COMMUNITY): Payer: Medicaid Other

## 2012-01-18 ENCOUNTER — Other Ambulatory Visit: Payer: Self-pay | Admitting: Lab

## 2012-01-18 ENCOUNTER — Ambulatory Visit: Payer: Self-pay

## 2012-01-18 ENCOUNTER — Ambulatory Visit: Payer: Self-pay | Admitting: Physician Assistant

## 2012-01-18 DIAGNOSIS — K8020 Calculus of gallbladder without cholecystitis without obstruction: Secondary | ICD-10-CM

## 2012-01-18 DIAGNOSIS — R1011 Right upper quadrant pain: Secondary | ICD-10-CM

## 2012-01-18 DIAGNOSIS — R509 Fever, unspecified: Secondary | ICD-10-CM

## 2012-01-18 LAB — BASIC METABOLIC PANEL
Chloride: 104 mEq/L (ref 96–112)
Creatinine, Ser: 0.57 mg/dL (ref 0.50–1.10)
GFR calc Af Amer: >90 mL/min (ref >90)
Potassium: 3.7 mEq/L (ref 3.5–5.1)
Sodium: 136 mEq/L (ref 135–145)

## 2012-01-18 LAB — GLUCOSE, CAPILLARY: Glucose-Capillary: 190 mg/dL — ABNORMAL HIGH (ref 70–99)

## 2012-01-18 LAB — CBC
HCT: 29.7 % — ABNORMAL LOW (ref 36.0–46.0)
RBC: 3.39 MIL/uL — ABNORMAL LOW (ref 3.87–5.11)
RDW: 15.8 % — ABNORMAL HIGH (ref 11.5–15.5)
WBC: 13.5 10*3/uL — ABNORMAL HIGH (ref 4.0–10.5)

## 2012-01-18 LAB — MAGNESIUM: Magnesium: 2.1 mg/dL (ref 1.5–2.5)

## 2012-01-18 MED ORDER — IOHEXOL 350 MG/ML SOLN: 100.0000 mL | Freq: Once | INTRAVENOUS | Status: AC | PRN

## 2012-01-18 MED ORDER — INSULIN GLARGINE 100 UNIT/ML ~~LOC~~ SOLN
15.0000 [IU] | Freq: Every day | SUBCUTANEOUS | Status: DC
  Administered 2012-01-18: 15 [IU] via SUBCUTANEOUS

## 2012-01-18 MED ORDER — ENOXAPARIN SODIUM 40 MG/0.4ML ~~LOC~~ SOLN
40.0000 mg | SUBCUTANEOUS | Status: DC
  Administered 2012-01-18 – 2012-01-20 (×3): 40 mg via SUBCUTANEOUS
  Filled 2012-01-18 (×4): qty 0.4

## 2012-01-18 MED ORDER — OXYCODONE HCL 5 MG PO TABS
10.0000 mg | ORAL_TABLET | ORAL | Status: DC | PRN
  Administered 2012-01-18 – 2012-01-20 (×8): 10 mg via ORAL
  Filled 2012-01-18: qty 1
  Filled 2012-01-18 (×4): qty 2
  Filled 2012-01-18: qty 1
  Filled 2012-01-18: qty 2
  Filled 2012-01-18: qty 1
  Filled 2012-01-18: qty 2
  Filled 2012-01-18: qty 1

## 2012-01-18 MED ORDER — ENSURE COMPLETE PO LIQD
237.0000 mL | Freq: Two times a day (BID) | ORAL | Status: DC
  Administered 2012-01-19 – 2012-01-20 (×3): 237 mL via ORAL

## 2012-01-18 MED ORDER — MORPHINE SULFATE 4 MG/ML IJ SOLN
4.0000 mg | INTRAMUSCULAR | Status: DC | PRN
  Administered 2012-01-18 – 2012-01-20 (×7): 4 mg via INTRAVENOUS
  Filled 2012-01-18 (×9): qty 1

## 2012-01-18 NOTE — Consult Note (Addendum)
Reason for Consult:  Cholelithiasis Referring Physician: Rama  [name] is an 26 y.o. female.  HPI: Patient is a 26 year old female who special one month at [facility] with a new diagnosis of acute  promyelocytic leukemia. She was recently hospitalized 4/22 thru 01/15/2012, here [facility] for IV and oral chemotherapy with Vesanoid.  She went home on 01/15/2012, doing well. She woke up the next morning with pain and fever described as sharp constant intensity worse with coughing deep breathing sneezing and moving. Nothing relieved her pain except for pain medications. She was readmitted 01/17/12 by the medicine service. She points to her pain and she is tender in the right upper quadrant. Chest x-ray shows hypoaeration, atelectasis versus infiltrate or so her right chest wall Port-A-Cath tip projecting over the SVC. Limited abdominal ultrasound shows the gallbladder partially contracted with multiple shadowing foci. No  gallbladder wall thickening or pericholecystic fluid the common bile duct was 4 mm and appeared normal.  CT shows cholelithiasis, no gallbladder wall thickening or pericholecystic fluid hepatic steatosis. Otherwise unremarkable. HIDA scan showed uniform uptake by the liver with clearance from a blood pool normal physiologic accumulation within the gallbladder in 10 minutes. Patent cystic duct without evidence for acute cholecystitis.  She continues to have pain in the right upper quadrant with any aspiration, movement. She feels short of breath when she speaks. The only thing he gives her relief his pain medicine. She is able to eat a regular diet she had eggs, grits and chicken tenders this morning for breakfast with no nausea. She does have nausea after pain medications were given she's also had some vomiting after pain meds. Labs on admission showed an elevated white count 14,900, she has a gram-negative urinary tract infection final some cultures are still pending.  LFTs on 4/27 were normal, I do not see a lipase.  Past Medical History  Diagnosis Date  . Acute promyelocytic leukemia  Diabetes mellitus type II   No medical RX till recently HBA1C 7.7   . Hypertension   . Nephrolithiasis   . Hepatic steatosis 01/17/2012  . Cholelithiasis BMI 50 01/17/2012    Past Surgical History  Procedure Date  . Portacath placement     [facility]    Family History  Problem Relation Age of Onset  . Kidney disease Mother   . Kidney disease Maternal Grandmother   . Diabetes Maternal Grandmother   . Hypertension Mother   . Diabetes Father   Father AODM type I , mother-  Hypertension, 2 sisters good health  Social History:  reports that she has never smoked. She does not have any smokeless tobacco history on file. She reports that she does not drink alcohol or use illicit drugs.  She has had one 50 y/o child/  Allergies:  Allergies  Allergen Reactions  . Ultram (Tramadol Hcl) Nausea And Vomiting    Medications:  Prior to Admission:  Prescriptions prior to admission  Medication Sig Dispense Refill  . lidocaine-prilocaine (EMLA) cream Apply topically as needed.  30 g  0  . lisinopril (PRINIVIL,ZESTRIL) 10 MG tablet Take 1 tablet (10 mg total) by mouth daily.  30 tablet  4  . metFORMIN (GLUCOPHAGE) 1000 MG tablet Take 1 tablet (1,000 mg total) by mouth daily with breakfast.  90 tablet  4  . promethazine (PHENERGAN) 12.5 MG tablet Take 1 tablet (12.5 mg total) by mouth every 6 (six) hours as needed for nausea.  30 tablet  4  . tretinoin (VESANOID)  10 MG capsule Take 5 capsules (50 mg total) by mouth 2 (two) times daily.      Marland Kitchen tretinoin (VESANOID) 10 MG capsule Take 5 capsules (50 mg total) by mouth 2 (two) times daily with a meal.  100 capsule  4   Scheduled:   . enoxaparin  40 mg Subcutaneous Q24H  . feeding supplement  237 mL Oral BID BM  . insulin aspart  0-15 Units Subcutaneous TID WC  . insulin aspart  0-5 Units Subcutaneous QHS  . insulin glargine  15  Units Subcutaneous QHS  . lisinopril  10 mg Oral Daily  . piperacillin-tazobactam (ZOSYN)  IV  3.375 g Intravenous Q8H  . DISCONTD: insulin glargine  10 Units Subcutaneous QHS   Continuous:   . sodium chloride 75 mL/hr (01/18/12 1031)   ZOX:[id number], acetaminophen, lidocaine-prilocaine, morphine, ondansetron (ZOFRAN) IV, ondansetron, oxyCODONE, promethazine, DISCONTD: morphine, DISCONTD: oxyCODONE Anti-infectives     Start     Dose/Rate Route Frequency Ordered Stop   01/17/12 2000   piperacillin-tazobactam (ZOSYN) IVPB 3.375 g        3.375 g 12.5 mL/hr over 240 Minutes Intravenous Every 8 hours 01/17/12 1015     01/17/12 1100   piperacillin-tazobactam (ZOSYN) IVPB 3.375 g        3.375 g 100 mL/hr over 30 Minutes Intravenous  Once 01/17/12 1015 01/17/12 1058   01/17/12 0600   cefTRIAXone (ROCEPHIN) 1 g in dextrose 5 % 50 mL IVPB        1 g 100 mL/hr over 30 Minutes Intravenous  Once 01/17/12 0545 01/17/12 0706          Results for orders placed during the hospital encounter of 01/17/12 (from the past 48 hour(s))  URINALYSIS, ROUTINE W REFLEX MICROSCOPIC     Status: Abnormal   Collection Time   01/17/12  3:52 AM      Component Value Range Comment   Color, Urine YELLOW  YELLOW     APPearance CLOUDY (*) CLEAR     Specific Gravity, Urine 1.018  1.005 - 1.030     pH 6.0  5.0 - 8.0     Glucose, UA 500 (*) NEGATIVE (mg/dL)    Hgb urine dipstick TRACE (*) NEGATIVE     Bilirubin Urine NEGATIVE  NEGATIVE     Ketones, ur NEGATIVE  NEGATIVE (mg/dL)    Protein, ur NEGATIVE  NEGATIVE (mg/dL)    Urobilinogen, UA 1.0  0.0 - 1.0 (mg/dL)    Nitrite NEGATIVE  NEGATIVE     Leukocytes, UA SMALL (*) NEGATIVE    URINE MICROSCOPIC-ADD ON     Status: Abnormal   Collection Time   01/17/12  3:52 AM      Component Value Range Comment   Squamous Epithelial / LPF MANY (*) RARE     WBC, UA 11-20  <3 (WBC/hpf)    Bacteria, UA MANY (*) RARE    URINE CULTURE     Status: Normal (Preliminary  result)   Collection Time   01/17/12  3:52 AM      Component Value Range Comment   Specimen Description URINE, CLEAN CATCH      Special Requests NONE      Culture  Setup Time [id number]      Colony Count >=100,000 COLONIES/ML      Culture GRAM NEGATIVE RODS      Report Status PENDING     CBC     Status: Abnormal   Collection Time   01/17/12  4:35  AM      Component Value Range Comment   WBC 14.9 (*) 4.0 - 10.5 (K/uL)    RBC 3.66 (*) 3.87 - 5.11 (MIL/uL)    Hemoglobin 11.1 (*) 12.0 - 15.0 (g/dL)    HCT 16.1 (*) 09.6 - 46.0 (%)    MCV 88.3  78.0 - 100.0 (fL)    MCH 30.3  26.0 - 34.0 (pg)    MCHC 34.4  30.0 - 36.0 (g/dL)    RDW 04.5  40.9 - 81.1 (%)    Platelets 214  150 - 400 (K/uL)   COMPREHENSIVE METABOLIC PANEL     Status: Abnormal   Collection Time   01/17/12  4:35 AM      Component Value Range Comment   Sodium 133 (*) 135 - 145 (mEq/L)    Potassium 3.6  3.5 - 5.1 (mEq/L)    Chloride 99  96 - 112 (mEq/L)    CO2 21  19 - 32 (mEq/L)    Glucose, Bld 274 (*) 70 - 99 (mg/dL)    BUN 21  6 - 23 (mg/dL)    Creatinine, Ser 9.14  0.50 - 1.10 (mg/dL)    Calcium 8.8  8.4 - 10.5 (mg/dL)    Total Protein 6.4  6.0 - 8.3 (g/dL)    Albumin 3.7  3.5 - 5.2 (g/dL)    AST 19  0 - 37 (U/L)    ALT 16  0 - 35 (U/L)    Alkaline Phosphatase 105  39 - 117 (U/L)    Total Bilirubin 0.5  0.3 - 1.2 (mg/dL)    GFR calc non Af Amer >90  >90 (mL/min)    GFR calc Af Amer >90  >90 (mL/min)   GLUCOSE, CAPILLARY     Status: Abnormal   Collection Time   01/17/12  5:07 AM      Component Value Range Comment   Glucose-Capillary 268 (*) 70 - 99 (mg/dL)    Comment 1 Documented in Chart      Comment 2 Notify RN     GLUCOSE, CAPILLARY     Status: Abnormal   Collection Time   01/17/12  9:52 AM      Component Value Range Comment   Glucose-Capillary 241 (*) 70 - 99 (mg/dL)   GLUCOSE, CAPILLARY     Status: Abnormal   Collection Time   01/17/12  1:13 PM      Component Value Range Comment   Glucose-Capillary 219  (*) 70 - 99 (mg/dL)    Comment 1 Notify RN     CULTURE, BLOOD (ROUTINE X 2)     Status: Normal (Preliminary result)   Collection Time   01/17/12  4:26 PM      Component Value Range Comment   Specimen Description BLOOD LEFT ARM      Special Requests BOTTLES DRAWN AEROBIC ONLY 1CC      Culture  Setup Time [id number]      Culture        Value:        BLOOD CULTURE RECEIVED NO GROWTH TO DATE CULTURE WILL BE HELD FOR 5 DAYS BEFORE ISSUING A FINAL NEGATIVE REPORT   Report Status PENDING     CULTURE, BLOOD (ROUTINE X 2)     Status: Normal (Preliminary result)   Collection Time   01/17/12  4:50 PM      Component Value Range Comment   Specimen Description BLOOD LEFT HAND      Special Requests BOTTLES DRAWN  AEROBIC ONLY 2CC      Culture  Setup Time [id number]      Culture        Value:        BLOOD CULTURE RECEIVED NO GROWTH TO DATE CULTURE WILL BE HELD FOR 5 DAYS BEFORE ISSUING A FINAL NEGATIVE REPORT   Report Status PENDING     GLUCOSE, CAPILLARY     Status: Abnormal   Collection Time   01/17/12  5:54 PM      Component Value Range Comment   Glucose-Capillary 186 (*) 70 - 99 (mg/dL)   GLUCOSE, CAPILLARY     Status: Abnormal   Collection Time   01/17/12  8:34 PM      Component Value Range Comment   Glucose-Capillary 219 (*) 70 - 99 (mg/dL)   BASIC METABOLIC PANEL     Status: Abnormal   Collection Time   01/18/12  6:30 AM      Component Value Range Comment   Sodium 136  135 - 145 (mEq/L)    Potassium 3.7  3.5 - 5.1 (mEq/L)    Chloride 104  96 - 112 (mEq/L)    CO2 25  19 - 32 (mEq/L)    Glucose, Bld 191 (*) 70 - 99 (mg/dL)    BUN 10  6 - 23 (mg/dL)    Creatinine, Ser 8.11  0.50 - 1.10 (mg/dL)    Calcium 8.5  8.4 - 10.5 (mg/dL)    GFR calc non Af Amer >90  >90 (mL/min)    GFR calc Af Amer >90  >90 (mL/min)   CBC     Status: Abnormal   Collection Time   01/18/12  6:30 AM      Component Value Range Comment   WBC 13.5 (*) 4.0 - 10.5 (K/uL)    RBC 3.39 (*) 3.87 - 5.11 (MIL/uL)     Hemoglobin 10.3 (*) 12.0 - 15.0 (g/dL)    HCT 91.4 (*) 78.2 - 46.0 (%)    MCV 87.6  78.0 - 100.0 (fL)    MCH 30.4  26.0 - 34.0 (pg)    MCHC 34.7  30.0 - 36.0 (g/dL)    RDW 95.6 (*) 21.3 - 15.5 (%)    Platelets 166  150 - 400 (K/uL)   GLUCOSE, CAPILLARY     Status: Abnormal   Collection Time   01/18/12  7:46 AM      Component Value Range Comment   Glucose-Capillary 190 (*) 70 - 99 (mg/dL)    Comment 1 Notify RN     GLUCOSE, CAPILLARY     Status: Abnormal   Collection Time   01/18/12 11:56 AM      Component Value Range Comment   Glucose-Capillary 243 (*) 70 - 99 (mg/dL)    Comment 1 Notify RN     D-DIMER, QUANTITATIVE     Status: Abnormal   Collection Time   01/18/12  2:17 PM      Component Value Range Comment   D-Dimer, Quant 1.66 (*) 0.00 - 0.48 (ug/mL-FEU)     Dg Chest 2 View  01/17/2012  *RADIOLOGY REPORT*  Clinical Data: Fever, difficulty breathing.  CHEST - 2 VIEW  Comparison: 07/21/2006  Findings: Hypoaeration results in interstitial and vascular crowding.  Mild streaky opacity at the lung bases.  Right chest wall Port-A-Cath with tip projecting over the proximal SVC.  Heart size upper normal limits.  No pleural effusion or pneumothorax.  No acute osseous abnormality.  IMPRESSION: Mild streaky opacity at  the lung bases; atelectasis versus infiltrate.  Hypoaeration results in interstitial and vascular crowding.  Original Report Authenticated By: Waneta Martins, M.D.   Nm Hepatobiliary  01/17/2012  *RADIOLOGY REPORT*  Clinical Data:  Evaluate for cholecystitis  NUCLEAR MEDICINE HEPATOBILIARY IMAGING  Technique:  Sequential images of the abdomen were obtained out to 60 minutes following intravenous administration of radiopharmaceutical.  Radiopharmaceutical:  5.53mCi Tc-29m Choletec  Comparison:  None.  Findings: Following the intravenous administration of the radiopharmaceutical there is uniform uptake by the liver with clearance from the blood pool.  There is normal physiologic  accumulation within the gallbladder within 10 minutes.  IMPRESSION:  1.  Patent cystic duct without evidence for acute cholecystitis.  Original Report Authenticated By: Rosealee Albee, M.D.   Ct Abdomen Pelvis W Contrast  01/17/2012  *RADIOLOGY REPORT*  Clinical Data: Right-sided abdominal pain, fever.  CT ABDOMEN AND PELVIS WITH CONTRAST  Technique:  Multidetector CT imaging of the abdomen and pelvis was performed following the standard protocol during bolus administration of intravenous contrast.  Contrast: OMNIPAQUE IOHEXOL 300 MG/ML  SOLN  Comparison: 01/17/2012 ultrasound, 11/03/2008 CT  Findings: Limited images through the lung bases demonstrate no significant appreciable abnormality. The heart size is within normal limits. No pleural or pericardial effusion.  Hepatic steatosis.  Cholelithiasis.  No gallbladder wall thickening or pericholecystic fluid.  Unremarkable spleen, pancreas, adrenal glands.  Symmetric renal enhancement.  No hydronephrosis or hydroureter.  No bowel obstruction.  No CT evidence for colitis.  Normal appendix.  No free intraperitoneal air. Within the pelvis, there is trace free fluid.  No lymphadenopathy.  Thin-walled bladder.  Unremarkable CT appearance to the uterus and adnexa.  No acute osseous abnormality.  IMPRESSION: Cholelithiasis without CT evidence for acute cholecystitis.  HIDA scan is the study of choice if clinical concern for acute cholecystitis persists.  Hepatic steatosis.  Normal appendix.  Original Report Authenticated By: Waneta Martins, M.D.   US Abdomen Limited  01/17/2012  *RADIOLOGY REPORT*  Clinical Data:  Right-sided abdominal pain and fever.  LIMITED ABDOMINAL ULTRASOUND - RIGHT UPPER QUADRANT  Comparison:   11/03/2008 CT  Findings:  Gallbladder:  Partially contracted containing multiple echogenic shadowing foci.  No gallbladder wall thickening or pericholecystic fluid.  Common bile duct:  4 mm in diameter, normal appearance.  Liver:   Heterogeneous echogenicity suggests areas of fatty infiltration.  No focal abnormality identified.  IMPRESSION: Cholelithiasis without gallbladder wall thickening or pericholecystic fluid. HIDA scan could be obtained to evalute for acute cholecystitis if clinically warranted.  Hepatic steatosis.                   Original Report Authenticated By: Waneta Martins, M.D.    Review of Systems  Constitutional: Positive for fever, chills and weight loss (pt says she's lost 10 lbs since she was here last week.). Negative for diaphoresis.  HENT: Negative.   Eyes: Negative.   Respiratory: Positive for shortness of breath (Feels SOB, becomes winded talking.). Negative for cough, hemoptysis, sputum production and wheezing.        Hurts to breath in, hurts to move  Cardiovascular: Positive for leg swelling (some leg swelling).  Gastrointestinal: Positive for heartburn, nausea, vomiting and abdominal pain. Negative for diarrhea, constipation and blood in stool.       Last BM today She can eat regular diet without any increased nausea.  She does have nausea with pain meds.  Genitourinary:       Denies any pain  or discomfort with voiding.  Musculoskeletal: Negative.   Skin: Negative for itching and rash.  Neurological: Negative.  Negative for weakness.  Psychiatric/Behavioral: Negative.    Blood pressure 128/84, pulse 96, temperature 98 F (36.7 C), temperature source Oral, resp. rate 22, height 5\' 2"  (1.575 m), weight 124.739 kg (275 lb), last menstrual period 12/28/2011, SpO2 94.00%. Physical Exam  Constitutional: She is oriented to person, place, and time. She appears well-developed.       Obese Feels terrible, hurts all the time except with pain meds.  HENT:  Head: Normocephalic.  Nose: Nose normal.  Eyes: Conjunctivae and EOM are normal. Pupils are equal, round, and reactive to light. No scleral icterus.  Neck: Normal range of motion. Neck supple. No JVD present. No tracheal deviation present.  No thyromegaly present.  Cardiovascular: Normal rate, regular rhythm, normal heart sounds and intact distal pulses.  Exam reveals no gallop.   No murmur heard. Respiratory: Effort normal. No respiratory distress. She has no wheezes. She has no rales. She exhibits no tenderness.       Decreased breath sounds, Hurts to breath in. Hurts to move in bed  GI: Soft. Bowel sounds are normal. She exhibits no distension and no mass. There is tenderness (she really hurts in RUQ.). There is no rebound and no guarding.  Musculoskeletal: She exhibits no edema.  Lymphadenopathy:    She has no cervical adenopathy.  Neurological: She is alert and oriented to person, place, and time. She has normal reflexes. No cranial nerve deficit.  Skin: Skin is warm and dry. No rash noted. No erythema.  Psychiatric: She has a normal mood and affect. Her behavior is normal. Judgment and thought content normal.    Assessment/Plan: 1. New onset RUQ pain, cholelithiasis, with normal CT, normal limited ultrasound, and normal HIDA scan. 2.UTI 3. 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 4.Diabetes Type II, no treatment till recently. 5.Hypertension 6. Hx Nephrolithiasis 7. Hepatic Steatosis. 8. BMI 50  Plan:  Her exam makes you want to blame her gallbladder, but studies are negative aside from her cholelithiasis.  Her pain is constant, made worse with inspiration, movement.  She is able to tolerate a regular diet, without difficulty. She has nausea, and some vomiting, but not till she gets the pain meds.  Dr. Caron Presume is looking into the pulmonary component of her symptoms, D-dimer is mildly positive.  I will recheck labs including lipase in AM.  Discuss with Dr.Wakefield.   Will Marlyne Beards PA for DR. Wakefield.   Melissa Washington 01/18/2012, 4:18 PM

## 2012-01-18 NOTE — Progress Notes (Signed)
INITIAL ADULT NUTRITION ASSESSMENT Date: 01/18/2012   Time: 2:00 PM Reason for Assessment: Consult  ASSESSMENT: Female 26 y.o.  Dx: Fever   Food/Nutrition Related Hx: Pt reports starting to eat healthy since her leukemia diagnosis last month s/p induction chemotherapy. Pt reports she has started selecting only grilled meats and also eating more fruits and vegetables. Pt reports drinking Ensure when she was at [facility] and would be interested in getting some during admission. Pt's intake has been 100% of meals during admission. Pt reports 10 pound unintended weight loss in the past month most recently r/t abdominal pain.    Hx:  Past Medical History  Diagnosis Date  . Diabetes mellitus   . Hypertension   . Nephrolithiasis   . 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 Hepatic steatosis 01/17/2012  . Cholelithiasis 01/17/2012   Related Meds:  Scheduled Meds:   . enoxaparin  40 mg Subcutaneous Q24H  . insulin aspart  0-15 Units Subcutaneous TID WC  . insulin aspart  0-5 Units Subcutaneous QHS  . insulin glargine  15 Units Subcutaneous QHS  . lisinopril  10 mg Oral Daily  . piperacillin-tazobactam (ZOSYN)  IV  3.375 g Intravenous Q8H  . DISCONTD: insulin glargine  10 Units Subcutaneous QHS   Continuous Infusions:   . sodium chloride 75 mL/hr (01/18/12 1031)   PRN Meds:.acetaminophen, acetaminophen, lidocaine-prilocaine, morphine, ondansetron (ZOFRAN) IV, ondansetron, oxyCODONE, promethazine, DISCONTD: morphine, DISCONTD: oxyCODONE  Ht: 5\' 2"  (157.5 cm)  Wt: 275 lb (124.739 kg)  Ideal Wt: 110 lb % Ideal Wt: 250  Usual Wt: 285 lb % Usual Wt: 96  Body mass index is 50.30 kg/(m^2).   Labs:  CMP     Component Value Date/Time   NA 136 01/18/2012 0630   K 3.7 01/18/2012 0630   CL 104 01/18/2012 0630   CO2 25 01/18/2012 0630   GLUCOSE 191* 01/18/2012 0630   BUN 10 01/18/2012 0630   CREATININE 0.57 01/18/2012 0630   CALCIUM 8.5 01/18/2012 0630   PROT 6.4  01/17/2012 0435   ALBUMIN 3.7 01/17/2012 0435   AST 19 01/17/2012 0435   ALT 16 01/17/2012 0435   ALKPHOS 105 01/17/2012 0435   BILITOT 0.5 01/17/2012 0435   GFRNONAA >90 01/18/2012 0630   GFRAA >90 01/18/2012 0630   Lab Results  Component Value Date   HGBA1C 7.7* 11/25/2011   CBG (last 3)   Basename 01/18/12 1156 01/18/12 0746 01/17/12 2034  GLUCAP 243* 190* 219*    Intake/Output Summary (Last 24 hours) at 01/18/12 1413 Last data filed at 01/18/12 0600  Gross per 24 hour  Intake   3100 ml  Output   1700 ml  Net   1400 ml   Last BM - 01/17/12  Diet Order: Carb Control   IVF:    sodium chloride Last Rate: 75 mL/hr (01/18/12 1031)    Estimated Nutritional Needs:   Kcal:1750-2000 Protein:60-75g Fluid:1.7-2L  NUTRITION DIAGNOSIS: -Increased nutrient needs (NI-5.1).  Status: Ongoing  RELATED TO: leukemia   AS EVIDENCE BY: H&P  MONITORING/EVALUATION(Goals): Pt to consume >90% of meals/supplements.   EDUCATION NEEDS: -Education needs addressed - reviewed healthy eating and discussed well balanced diet.   INTERVENTION: Strawberry Ensure Complete BID. Encouraged increased intake. Will monitor.   Dietitian #: [phone]  DOCUMENTATION CODES Per approved criteria  -Morbid Obesity    Marshall Cork 01/18/2012, 2:00 PM

## 2012-01-18 NOTE — Progress Notes (Signed)
Subjective: 26 year old Afro-American woman with no history of APM L. , status post recent arsenic infusion with Vesanoid orally , discharge from hospital stable condition we admitted 24 platelets fever and right upper quadrant pain.   Objective: Temp:  [98.5 F (36.9 C)-101.7 F (38.7 C)] 99.4 F (37.4 C) (04/29 1005) Pulse Rate:  [98-126] 126  (04/29 1005) Resp:  [18-22] 22  (04/29 1005) BP: (118-139)/(63-88) 127/88 mmHg (04/29 1005) SpO2:  [97 %-100 %] 99 % (04/29 1005)  General appearance: alert, cooperative and appears stated age Head: Normocephalic, without obvious abnormality, atraumatic Throat: lips, mucosa, and tongue normal; teeth and gums normal Resp: clear to auscultation bilaterally and normal percussion bilaterally Breasts: normal appearance, no masses or tenderness Cardio: regular rate and rhythm, S1, S2 normal, no murmur, click, rub or gallop GI: soft, bowel sounds are present, she has tenderness or upper quadrant with some mild rebound. Extremities: extremities normal, atraumatic, no cyanosis or edema Pulses: 2+ and symmetric Lymph nodes: Cervical, supraclavicular, and axillary nodes normal. Neurologic: Grossly normal  Labs:  Basename 01/18/12 0630 01/17/12 0435  WBC 13.5* 14.9*  HGB 10.3* 11.1*  HCT 29.7* 32.3*  PLT 166 214    Basename 01/18/12 0630 01/17/12 0435  NA 136 133*  K 3.7 3.6  CL 104 99  CO2 25 21  GLUCOSE 191* 274*  BUN 10 21  CREATININE 0.57 0.59  CALCIUM 8.5 8.8    Results for orders placed during the hospital encounter of 01/17/12  URINE CULTURE     Status: Normal (Preliminary result)   Collection Time   01/17/12  3:52 AM      Component Value Range Status Comment   Specimen Description URINE, CLEAN CATCH   Final    Special Requests NONE   Final    Culture  Setup Time [id number]   Final    Colony Count >=100,000 COLONIES/ML   Final    Culture GRAM NEGATIVE RODS   Final    Report Status PENDING   Incomplete   CULTURE, BLOOD  (ROUTINE X 2)     Status: Normal (Preliminary result)   Collection Time   01/17/12  4:26 PM      Component Value Range Status Comment   Specimen Description BLOOD LEFT ARM   Final    Special Requests BOTTLES DRAWN AEROBIC ONLY 1CC   Final    Culture  Setup Time [id number]   Final    Culture     Final    Value:        BLOOD CULTURE RECEIVED NO GROWTH TO DATE CULTURE WILL BE HELD FOR 5 DAYS BEFORE ISSUING A FINAL NEGATIVE REPORT   Report Status PENDING   Incomplete   CULTURE, BLOOD (ROUTINE X 2)     Status: Normal (Preliminary result)   Collection Time   01/17/12  4:50 PM      Component Value Range Status Comment   Specimen Description BLOOD LEFT HAND   Final    Special Requests BOTTLES DRAWN AEROBIC ONLY [facility]   Final    Culture  Setup Time [id number]   Final    Culture     Final    Value:        BLOOD CULTURE RECEIVED NO GROWTH TO DATE CULTURE WILL BE HELD FOR 5 DAYS BEFORE ISSUING A FINAL NEGATIVE REPORT   Report Status PENDING   Incomplete      Dg Chest 2 View  01/17/2012  *RADIOLOGY REPORT*  Clinical Data: Fever, difficulty breathing.  CHEST - 2 VIEW  Comparison: 07/21/2006  Findings: Hypoaeration results in interstitial and vascular crowding.  Mild streaky opacity at the lung bases.  Right chest wall Port-A-Cath with tip projecting over the proximal SVC.  Heart size upper normal limits.  No pleural effusion or pneumothorax.  No acute osseous abnormality.  IMPRESSION: Mild streaky opacity at the lung bases; atelectasis versus infiltrate.  Hypoaeration results in interstitial and vascular crowding.  Original Report Authenticated By: Waneta Martins, M.D.   Nm Hepatobiliary  01/17/2012  *RADIOLOGY REPORT*  Clinical Data:  Evaluate for cholecystitis  NUCLEAR MEDICINE HEPATOBILIARY IMAGING  Technique:  Sequential images of the abdomen were obtained out to 60 minutes following intravenous administration of radiopharmaceutical.  Radiopharmaceutical:  5.52mCi Tc-28m Choletec  Comparison:   None.  Findings: Following the intravenous administration of the radiopharmaceutical there is uniform uptake by the liver with clearance from the blood pool.  There is normal physiologic accumulation within the gallbladder within 10 minutes.  IMPRESSION:  1.  Patent cystic duct without evidence for acute cholecystitis.  Original Report Authenticated By: Rosealee Albee, M.D.   Ct Abdomen Pelvis W Contrast  01/17/2012  *RADIOLOGY REPORT*  Clinical Data: Right-sided abdominal pain, fever.  CT ABDOMEN AND PELVIS WITH CONTRAST  Technique:  Multidetector CT imaging of the abdomen and pelvis was performed following the standard protocol during bolus administration of intravenous contrast.  Contrast: OMNIPAQUE IOHEXOL 300 MG/ML  SOLN  Comparison: 01/17/2012 ultrasound, 11/03/2008 CT  Findings: Limited images through the lung bases demonstrate no significant appreciable abnormality. The heart size is within normal limits. No pleural or pericardial effusion.  Hepatic steatosis.  Cholelithiasis.  No gallbladder wall thickening or pericholecystic fluid.  Unremarkable spleen, pancreas, adrenal glands.  Symmetric renal enhancement.  No hydronephrosis or hydroureter.  No bowel obstruction.  No CT evidence for colitis.  Normal appendix.  No free intraperitoneal air. Within the pelvis, there is trace free fluid.  No lymphadenopathy.  Thin-walled bladder.  Unremarkable CT appearance to the uterus and adnexa.  No acute osseous abnormality.  IMPRESSION: Cholelithiasis without CT evidence for acute cholecystitis.  HIDA scan is the study of choice if clinical concern for acute cholecystitis persists.  Hepatic steatosis.  Normal appendix.  Original Report Authenticated By: Waneta Martins, M.D.   US Abdomen Limited  01/17/2012  *RADIOLOGY REPORT*  Clinical Data:  Right-sided abdominal pain and fever.  LIMITED ABDOMINAL ULTRASOUND - RIGHT UPPER QUADRANT  Comparison:   11/03/2008 CT  Findings:  Gallbladder:  Partially  contracted containing multiple echogenic shadowing foci.  No gallbladder wall thickening or pericholecystic fluid.  Common bile duct:  4 mm in diameter, normal appearance.  Liver:  Heterogeneous echogenicity suggests areas of fatty infiltration.  No focal abnormality identified.  IMPRESSION: Cholelithiasis without gallbladder wall thickening or pericholecystic fluid. HIDA scan could be obtained to evalute for acute cholecystitis if clinically warranted.  Hepatic steatosis.                   Original Report Authenticated By: Waneta Martins, M.D.  :  Pathology: n/a  Principal Problem:  *Fever Active Problems:  APML (acute promyelocytic leukemia)  DIABETES-TYPE 2  HYPERTENSION, BENIGN ESSENTIAL  Cholelithiasis  Hepatic steatosis :  Disposition:-year-old with history of APM L. status post treatment now presenting with right upper quadrant pain, fever. She has a pleuritic component to this problem. She does have a mammogram in concerned that she has an underlying intra-abdominal process perhaps cholecystitis. She does have multiple gallstones  seen on ultrasound on CT though the high scan was negative and there is no obvious evidence of inflammation. Moreover her liver function tests are fairly normal. She is does have a positive urinalysis and positive for you a for gram-negative rods.  I will special to also do a CT scan to rule out underlying pulmonary embolism and she does have a pleuritic component. The chest x-ray does show some streaky atelectatic type changes in her lung base. I started him on low-dose Lovenox and to recheck the d-dimer. I have to surgery be involved in his case.

## 2012-01-18 NOTE — Progress Notes (Addendum)
PROGRESS NOTE  Melissa Washington ZOX:[id number] DOB: [birth year] DOA: 01/17/2012 PCP: Edd Arbour, MD, MD  Brief narrative: Melissa Washington is an 26 y.o. female with a PMH of APL under the care of Dr. Donnie Coffin for consolidation chemotherapy who presented to the hospital with a chief complaint of fever.  No obvious source has been identified, and the patient is being empirically treated with Zosyn to cover intra-abdominal infection.    Assessment/Plan: Principal Problem:  *Fever in a patient with APML and Cholelithiasis  Blood cultures, urine cultures obtained.  Status post CT scan and abdominal ultrasound which showed cholelithiasis without gallbladder wall thickening.  Clinical exam shows right upper and lower quadrant pain, concerning for cholecystitis.  HIDA scan done 01/17/2012, negative for cholecystitis. Diet advanced to regular after height of scan results found to be negative..  On empiric Zosyn to cover intra-abdominal pathogens.  Dr. Donnie Coffin notified of admission, and is aware of plan of care with no further recommendations. Addendum: Dr. Donnie Coffin saw patient and contacted me with recommendation that surgery be called for evaluation.  General surgery consultation requested. Active Problems:  DIABETES-TYPE 2  Hemoglobin A1c on 11/25/2011 was 7.7%.  Hold metformin.  Start moderate scale sliding scale insulin every 4 hours while n.p.o. which was switched to the moderate sliding scale insulin with 10 units of Lantus once her diet was advanced. CBGs 186-241 on this therapy. We'll increase Lantus to 15 units. HYPERTENSION, BENIGN ESSENTIAL  Continue lisinopril. Blood pressure controlled. Hepatic steatosis  Recommend weight loss.  Request a dietitian evaluation for diet education.   Code Status: Full Family Communication: None at bedside. Disposition Plan: Home, when stable  Medical Consultants:  Dr. Pierce Crane, Oncology.  Surgery  Other  consultants:  Dietitian  Antibiotics:  Zosyn 01/18/12--->   Subjective  Melissa Washington didn't use to have significant right upper quadrant pain which worsens with deep inspiration. No cough. Continues to have fevers. No diarrhea. States that her current level of pain is not well controlled with the medicines prescribed.   Objective    Interim History: Stable overnight.   Objective: Filed Vitals:   01/17/12 1750 01/17/12 2036 01/18/12 0159 01/18/12 0535  BP: 139/82 118/63 120/65 130/80  Pulse: 118 102 108 98  Temp: 101.7 F (38.7 C) 99 F (37.2 C) 100.3 F (37.9 C) 98.5 F (36.9 C)  TempSrc: Oral Oral Oral Oral  Resp: 18 20 20 20   Height:      Weight:      SpO2: 100% 98% 97% 97%    Intake/Output Summary (Last 24 hours) at 01/18/12 0852 Last data filed at 01/18/12 0600  Gross per 24 hour  Intake   3100 ml  Output   1700 ml  Net   1400 ml    Exam: Gen:  Mild distress secondary to abdominal discomfort. Cardiovascular:  RRR, No M/R/G Respiratory: Lungs diminished at the bases. Gastrointestinal: Abdomen soft, no rebound, significant tenderness right upper quadrant. Extremities: No C/E/C    Data Reviewed: Basic Metabolic Panel:  Lab 01/18/12 8119 01/17/12 0435 01/15/12 0535 01/13/12 0535 01/12/12 0610 01/11/12 1451 01/11/12 0924  NA 136 133* 138 -- 134* 139 --  K 3.7 3.6 -- -- -- -- --  CL 104 99 100 -- 100 104 --  CO2 25 21 25  -- 22 24 --  GLUCOSE 191* 274* 238* -- 268* 138* --  BUN 10 21 21  -- 13 10 --  CREATININE 0.57 0.59 0.58 -- 0.58 0.52 --  CALCIUM 8.5 8.8 9.7 --  10.5 9.5 --  MG -- -- 2.1 2.2 1.7 1.8 1.7  PHOS -- -- -- -- -- -- --   GFR Estimated Creatinine Clearance: 138 ml/min (by C-G formula based on Cr of 0.57). Liver Function Tests:  Lab 01/17/12 0435 01/15/12 0535 01/12/12 0610 01/11/12 1451 01/11/12 0924  AST 19 10 16 16 19   ALT 16 14 24 23 24   ALKPHOS 105 110 102 96 101  BILITOT 0.5 0.5 0.7 0.7 0.9  PROT 6.4 7.1 7.4 7.1 7.1  ALBUMIN  3.7 4.1 4.4 4.2 4.4   Coagulation profile  Lab 01/13/12 2200  INR 1.12  PROTIME --    CBC:  Lab 01/18/12 0630 01/17/12 0435 01/13/12 2200 01/11/12 1451 01/11/12 0922  WBC 13.5* 14.9* 14.4* 6.6 6.4  NEUTROABS -- -- -- 4.6 4.3  HGB 10.3* 11.1* 11.6* 11.2* 11.9  HCT 29.7* 32.3* 33.3* 32.7* 34.8  MCV 87.6 88.3 87.9 87.9 90.5  PLT 166 214 211 214 194   CBG:  Lab 01/18/12 0746 01/17/12 2034 01/17/12 1754 01/17/12 1313 01/17/12 0952  GLUCAP 190* 219* 186* 219* 241*   Microbiology Recent Results (from the past 240 hour(s))  CULTURE, BLOOD (ROUTINE X 2)     Status: Normal (Preliminary result)   Collection Time   01/17/12  4:26 PM      Component Value Range Status Comment   Specimen Description BLOOD LEFT ARM   Final    Special Requests BOTTLES DRAWN AEROBIC ONLY 1CC   Final    Culture  Setup Time [id number]   Final    Culture     Final    Value:        BLOOD CULTURE RECEIVED NO GROWTH TO DATE CULTURE WILL BE HELD FOR 5 DAYS BEFORE ISSUING A FINAL NEGATIVE REPORT   Report Status PENDING   Incomplete   CULTURE, BLOOD (ROUTINE X 2)     Status: Normal (Preliminary result)   Collection Time   01/17/12  4:50 PM      Component Value Range Status Comment   Specimen Description BLOOD LEFT HAND   Final    Special Requests BOTTLES DRAWN AEROBIC ONLY 2CC   Final    Culture  Setup Time [id number]   Final    Culture     Final    Value:        BLOOD CULTURE RECEIVED NO GROWTH TO DATE CULTURE WILL BE HELD FOR 5 DAYS BEFORE ISSUING A FINAL NEGATIVE REPORT   Report Status PENDING   Incomplete     Procedures and Diagnostic Studies:  Dg Chest 2 View 01/17/2012 IMPRESSION: Mild streaky opacity at the lung bases; atelectasis versus infiltrate.  Hypoaeration results in interstitial and vascular crowding.  Original Report Authenticated By: Waneta Martins, M.D.    Nm Hepatobiliary 01/17/2012 IMPRESSION:  1.  Patent cystic duct without evidence for acute cholecystitis.  Original Report  Authenticated By: Rosealee Albee, M.D.    Ct Abdomen Pelvis W Contrast 01/17/2012 IMPRESSION: Cholelithiasis without CT evidence for acute cholecystitis.  HIDA scan is the study of choice if clinical concern for acute cholecystitis persists.  Hepatic steatosis.  Normal appendix.  Original Report Authenticated By: Waneta Martins, M.D.    US Abdomen Limited 01/17/2012 IMPRESSION: Cholelithiasis without gallbladder wall thickening or pericholecystic fluid. HIDA scan could be obtained to evalute for acute cholecystitis if clinically warranted.  Hepatic steatosis.                   Original  Report Authenticated By: Waneta Martins, M.D.    Scheduled Meds:   . insulin aspart  0-15 Units Subcutaneous TID WC  . insulin aspart  0-5 Units Subcutaneous QHS  . insulin glargine  10 Units Subcutaneous QHS  . lisinopril  10 mg Oral Daily  . piperacillin-tazobactam  3.375 g Intravenous Once  . piperacillin-tazobactam (ZOSYN)  IV  3.375 g Intravenous Q8H  . DISCONTD: insulin aspart  0-15 Units Subcutaneous Q4H   Continuous Infusions:   . sodium chloride 125 mL/hr at 01/18/12 0207      LOS: 1 day   Hillery Aldo, MD Pager [phone]  01/18/2012, 8:52 AM   Patient Teaching (information given to and reviewed with the patient): TANJA GIFT 01/18/2012  Treatment team:   Dr. Hillery Aldo, Hospitalist (Internist)   Dr. Pierce Crane, Oncologist (aware of your admission)  Medical Issues and plan: Principal Problem:  *Fever with history of leukemia Blood cultures, urine cultures obtained and are pending.  Status post CT scan and abdominal ultrasound which showed gallstones without evidence of gallbladder infection.  HIDA scan done 01/17/2012, negative for gallbladder infection Diet advanced to regular after HIDA scan results found to be negative..  On broad-spectrum antibiotics.  Dr. Donnie Coffin notified of admission, and is aware of plan of care with no further  recommendations. Active Problems:  DIABETES-TYPE 2  Hemoglobin A1c on 11/25/2011 was 7.7%. Goal is less than 7%. This is a measure of how well your glucoses are controlled over a 3 month period of time. Metformin on hold because this medicine can interact with the dyes given for studies and stress your kidneys.  Continue insulin therapy, which we will adjust based on your sugar readings. CBGs 186-241 on this therapy. High blood pressure Continue lisinopril. Blood pressure controlled. Fatty liver Recommend weight loss.  Request a dietitian evaluation for diet education.   Anticipated discharge date:  1-3 days, depending on progress.

## 2012-01-18 NOTE — Progress Notes (Signed)
Inpatient Diabetes Program Recommendations  AACE/ADA: New Consensus Statement on Inpatient Glycemic Control (2009)  Target Ranges:  Prepandial:   less than 140 mg/dL      Peak postprandial:   less than 180 mg/dL (1-2 hours)      Critically ill patients:  140 - 180 mg/dL   Reason for Visit:Hyperglycemia  Note:  Patient was taking Metformin prior to admission-- not on insulin at home.  Note that Lantus dosage has been increased today.  Does MD anticipate that patient will require insulin at home when discharged?  If so, please order nursing staff to begin instruction regarding insulin preparation and administration, etc.  Thank you. Melissa Washington S. Melissa Lincoln, RN, CNS, CDE  [phone])

## 2012-01-19 ENCOUNTER — Other Ambulatory Visit: Payer: Self-pay | Admitting: Oncology

## 2012-01-19 ENCOUNTER — Inpatient Hospital Stay (HOSPITAL_COMMUNITY): Payer: Medicaid Other

## 2012-01-19 ENCOUNTER — Ambulatory Visit: Payer: Self-pay

## 2012-01-19 DIAGNOSIS — J189 Pneumonia, unspecified organism: Secondary | ICD-10-CM | POA: Diagnosis present

## 2012-01-19 LAB — GLUCOSE, CAPILLARY
Glucose-Capillary: 239 mg/dL — ABNORMAL HIGH (ref 70–99)
Glucose-Capillary: 187 mg/dL — ABNORMAL HIGH (ref 70–99)

## 2012-01-19 LAB — CBC
HCT: 28.2 % — ABNORMAL LOW (ref 36.0–46.0)
MCV: 87.6 fL (ref 78.0–100.0)
RBC: 3.22 MIL/uL — ABNORMAL LOW (ref 3.87–5.11)
WBC: 11.1 10*3/uL — ABNORMAL HIGH (ref 4.0–10.5)

## 2012-01-19 LAB — COMPREHENSIVE METABOLIC PANEL
BUN: 12 mg/dL (ref 6–23)
CO2: 24 mEq/L (ref 19–32)
Chloride: 100 mEq/L (ref 96–112)
Creatinine, Ser: 0.63 mg/dL (ref 0.50–1.10)
GFR calc Af Amer: >90 mL/min (ref >90)
GFR calc non Af Amer: >90 mL/min (ref >90)
Total Bilirubin: 0.7 mg/dL (ref 0.3–1.2)

## 2012-01-19 LAB — URINE CULTURE

## 2012-01-19 LAB — LIPASE, BLOOD: Lipase: 57 U/L (ref 11–59)

## 2012-01-19 MED ORDER — KETOROLAC TROMETHAMINE 30 MG/ML IJ SOLN
30.0000 mg | Freq: Three times a day (TID) | INTRAMUSCULAR | Status: DC
  Administered 2012-01-19 – 2012-01-21 (×6): 30 mg via INTRAVENOUS
  Filled 2012-01-19 (×9): qty 1

## 2012-01-19 MED ORDER — INSULIN GLARGINE 100 UNIT/ML ~~LOC~~ SOLN
20.0000 [IU] | Freq: Every day | SUBCUTANEOUS | Status: DC
  Administered 2012-01-19 – 2012-01-20 (×2): 20 [IU] via SUBCUTANEOUS

## 2012-01-19 MED ORDER — TRETINOIN 10 MG PO CAPS
45.0000 mg/m2/d | ORAL_CAPSULE | Freq: Two times a day (BID) | ORAL | Status: DC
  Administered 2012-01-19 – 2012-01-21 (×5): 50 mg via ORAL
  Filled 2012-01-19 (×8): qty 5

## 2012-01-19 NOTE — Progress Notes (Signed)
Subjective: Patient reports her pain in her abd continues. It seems to be worse at night and she reports she has been unable to sleep. She feels like the pain medication does help her cope some with the pain.  She has been able to eat her meals, but reports that it takes her longer than usual to eat due to the pain. She is passing flatus. She reports some emesis, but I do not see this documented. She has some nausea, but the pain medications may be causing some of this, and the Zofran helps with this.   Her Chest CT was negative for PE, but does show a small right effusion/infiltrate at the right base. Lipase and LFT's normal. Objective: Vital signs in last 24 hours: Temp:  [98 F (36.7 C)-99.4 F (37.4 C)] 98.6 F (37 C) (04/30 0403) Pulse Rate:  [96-126] 104  (04/30 0403) Resp:  [18-22] 18  (04/30 0403) BP: (125-144)/(82-88) 129/82 mmHg (04/30 0403) SpO2:  [94 %-99 %] 96 % (04/30 0403) Last BM Date: 01/17/12  Intake/Output from previous day: 04/29 0701 - 04/30 0700 In: 2958.5 [P.O.:830; I.V.:1834.5; IV Piggyback:294] Out: 1100 [Urine:1100] Intake/Output this shift:    General appearance: alert, cooperative, mild distress and morbidly obese Resp: clear to auscultation bilaterally Cardio: regular rate and rhythm and slightly tachycardic GI: obese, +BS, soft, tender RUQ>RLQ  Lab Results:   Basename 01/19/12 0545 01/18/12 0630  WBC 11.1* 13.5*  HGB 9.7* 10.3*  HCT 28.2* 29.7*  PLT 195 166   BMET  Basename 01/19/12 0545 01/18/12 0630  NA 134* 136  K 3.6 3.7  CL 100 104  CO2 24 25  GLUCOSE 248* 191*  BUN 12 10  CREATININE 0.63 0.57  CALCIUM 8.9 8.5   PT/INR No results found for this basename: LABPROT:2,INR:2 in the last 72 hours ABG No results found for this basename: PHART:2,PCO2:2,PO2:2,HCO3:2 in the last 72 hours  Studies/Results: Ct Angio Chest W/cm &/or Wo Cm  01/18/2012  *RADIOLOGY REPORT*  Clinical Data: Post chemotherapy.  Right upper quadrant pain.   CT ANGIOGRAPHY CHEST  Technique:  Multidetector CT imaging of the chest using the standard protocol during bolus administration of intravenous contrast. Multiplanar reconstructed images including MIPs were obtained and reviewed to evaluate the vascular anatomy.  Contrast:  100 ml Omnipaque 300  Comparison: CT  abdomen 01/17/2012  Findings: Exam is suboptimal due to patient body habitus.  No filling defects within the main pulmonary arteries to suggest acute pulmonary embolism.  The proximal lobar  arteries are clear of acute pulmonary embolism.  The segmental pulmonary arteries are poorly evaluated as above.  There are no acute findings aorta great vessels.  No pericardial fluid.  The esophagus is normal.  There is new mild air space disease and effusion at the right lung base.  Small focus of atelectasis at the left lung base.  No pneumothorax or pulmonary edema.  Airways appear normal.  Limited view of the upper abdomen is unremarkable.  No axillary supraclavicular adenopathy.  A port in the right anterior chest wall.  Subcutaneous nodule in the chest wall is likely and subcutaneous cyst/sebaceous cyst (17 mm (image 17).  IMPRESSION:  1.  No evidence of pulmonary embolism on suboptimal exam.  IF continued concern consider V/Q  scan in future due to patient body habitus. 2.  New bibasilar atelectasis.  Cannot exclude infiltrate at the right lung base.  Original Report Authenticated By: Genevive Bi, M.D.   Nm Hepatobiliary  01/17/2012  *RADIOLOGY REPORT*  Clinical Data:  Evaluate for cholecystitis  NUCLEAR MEDICINE HEPATOBILIARY IMAGING  Technique:  Sequential images of the abdomen were obtained out to 60 minutes following intravenous administration of radiopharmaceutical.  Radiopharmaceutical:  5.34mCi Tc-8m Choletec  Comparison:  None.  Findings: Following the intravenous administration of the radiopharmaceutical there is uniform uptake by the liver with clearance from the blood pool.  There is normal  physiologic accumulation within the gallbladder within 10 minutes.  IMPRESSION:  1.  Patent cystic duct without evidence for acute cholecystitis.  Original Report Authenticated By: Rosealee Albee, M.D.    Anti-infectives: Anti-infectives     Start     Dose/Rate Route Frequency Ordered Stop   01/17/12 2000   piperacillin-tazobactam (ZOSYN) IVPB 3.375 g        3.375 g 12.5 mL/hr over 240 Minutes Intravenous Every 8 hours 01/17/12 1015     01/17/12 1100   piperacillin-tazobactam (ZOSYN) IVPB 3.375 g        3.375 g 100 mL/hr over 30 Minutes Intravenous  Once 01/17/12 1015 01/17/12 1058   01/17/12 0600   cefTRIAXone (ROCEPHIN) 1 g in dextrose 5 % 50 mL IVPB        1 g 100 mL/hr over 30 Minutes Intravenous  Once 01/17/12 0545 01/17/12 0706          Assessment/Plan:   1. New onset RUQ pain, cholelithiasis, with normal CT, normal limited ultrasound, and normal HIDA scan. 2.UTI 3. 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 4.Diabetes Type II, no treatment till recently. 5.Hypertension 6. Hx Nephrolithiasis 7. Hepatic Steatosis. 8. BMI 50  Plan: She continues to localize her pain to the RUQ, but no definitive evidence of GS pancreatitis, or acute cholecystitis. She is continuing to eat well and her leukocytosis continues to improve. ? Infiltrate in right lung which may be causing some diaphragmatic irritation/pleuritic pain as a source of her continued pain. Will discuss further with Dr. Dwain Sarna.     LOS: 2 days   Mishawn Didion,PA-C Pager [phone] General Trauma Pager [phone]

## 2012-01-19 NOTE — Progress Notes (Signed)
Nutrition Brief Note  - Received 2nd consult for diet education, however pt seen by RD and educated yesterday - full assessment in chart. Healthy eating for fatty liver disease already discussed and pt without further educational needs. Will continue to monitor intake and be available for any further nutritional needs/concerns.  Dietitian# [phone]

## 2012-01-19 NOTE — Progress Notes (Signed)
PROGRESS NOTE  MADILYNNE MULLAN XBJ:[id number] DOB: 1986-07-10 DOA: 01/17/2012 PCP: Edd Arbour, MD, MD  Brief narrative: JEREMIE ABDELAZIZ is an 26 y.o. female with a PMH of APL under the care of Dr. Donnie Coffin for consolidation chemotherapy who presented to the hospital with a chief complaint of fever.  No obvious source has been identified, and the patient is being empirically treated with Zosyn to cover intra-abdominal infections and pneumonia.    Assessment/Plan: Principal Problem:  *Fever in a patient with APML and Cholelithiasis / CAP / UTI Blood cultures, urine cultures obtained. Preliminary urine cultures growing greater then 100,000 colonies of gram-negative rods. Status post CT scan and abdominal ultrasound which showed cholelithiasis without gallbladder wall thickening.  Clinical exam shows right upper and lower quadrant pain, concerning for cholecystitis or right pleurisy.  HIDA scan done 01/17/2012, negative for cholecystitis. Diet advanced to regular after height of scan results found to be negative..  On empiric Zosyn to cover intra-abdominal pathogens as well as pneumonia and urinary tract infection.  Dr. Donnie Coffin notified of admission.. Because of ongoing severe right upper quadrant pain, surgery consultation was performed on 01/18/2012 with no indication for immediate surgical exploration. CT scan of the chest was obtained on 01/18/2012 to rule out pulmonary embolism or other source of her ongoing pain, and this showed a possible right lower lobe pneumonia. Continue Zosyn for now. Add Toradol for inflammation and better pain control. Active Problems:  DIABETES-TYPE 2  Hemoglobin A1c on 11/25/2011 was 7.7%.  Hold metformin.  Start moderate scale sliding scale insulin every 4 hours while n.p.o. which was switched to the moderate sliding scale insulin with 10 units of Lantus once her diet was advanced. CBGs 190-243 on this therapy. Lantus increased to 15 units 01/18/2012, suboptimal  glycemic control, so will increase Lantus to 20 units today. No immediate plans to discharge patient on insulin therapy given that her hemoglobin A1c was 7.7% and metformin is currently on hold. HYPERTENSION, BENIGN ESSENTIAL  Continue lisinopril. Blood pressure controlled. Hepatic steatosis  Recommend weight loss.  Request a dietitian evaluation for diet education.  Code Status: Full Family Communication: Family updated at bedside. Disposition Plan: Home, when stable  Medical Consultants:  Dr. Pierce Crane, Oncology.  Dr. Emelia Loron, Surgery  Other consultants:  Dietitian  Diabetes Coordinator  Antibiotics:  Zosyn 01/18/12--->   Subjective  Mrs. Schliep continues to have significant right upper quadrant pain which worsens with deep inspiration. No cough. No fevers. No diarrhea. States that her current level of pain is not well controlled with the medicines prescribed.   Objective    Interim History: Stable overnight.   Objective: Filed Vitals:   01/18/12 1420 01/18/12 1815 01/18/12 2125 01/19/12 0403  BP: 128/84 125/86 144/82 129/82  Pulse: 96 97 103 104  Temp: 98 F (36.7 C) 98.4 F (36.9 C) 98.6 F (37 C) 98.6 F (37 C)  TempSrc: Oral Oral Oral Oral  Resp: 22 20 18 18   Height:      Weight:      SpO2: 94% 94% 99% 96%    Intake/Output Summary (Last 24 hours) at 01/19/12 0926 Last data filed at 01/19/12 0542  Gross per 24 hour  Intake 2958.5 ml  Output   1100 ml  Net 1858.5 ml    Exam: Gen:  Mild distress secondary to abdominal discomfort. Cardiovascular:  RRR, No M/R/G Respiratory: Lungs diminished at the bases. Gastrointestinal: Abdomen soft, no rebound, significant tenderness right upper quadrant. Extremities: No C/E/C    Data  Reviewed: Basic Metabolic Panel:  Lab 01/19/12 4098 01/18/12 1857 01/18/12 0630 01/17/12 0435 01/15/12 0535 01/13/12 0535  NA 134* -- 136 133* 138 --  K 3.6 -- 3.7 -- -- --  CL 100 -- 104 99 100 --  CO2 24  -- 25 21 25  --  GLUCOSE 248* -- 191* 274* 238* --  BUN 12 -- 10 21 21  --  CREATININE 0.63 -- 0.57 0.59 0.58 --  CALCIUM 8.9 -- 8.5 8.8 9.7 --  MG -- 2.1 -- -- 2.1 2.2  PHOS -- -- -- -- -- --   GFR Estimated Creatinine Clearance: 138 ml/min (by C-G formula based on Cr of 0.63). Liver Function Tests:  Lab 01/19/12 0545 01/17/12 0435 01/15/12 0535  AST 20 19 10   ALT 29 16 14   ALKPHOS 102 105 110  BILITOT 0.7 0.5 0.5  PROT 6.3 6.4 7.1  ALBUMIN 2.8* 3.7 4.1   Coagulation profile  Lab 01/13/12 2200  INR 1.12  PROTIME --    CBC:  Lab 01/19/12 0545 01/18/12 0630 01/17/12 0435 01/13/12 2200  WBC 11.1* 13.5* 14.9* 14.4*  NEUTROABS -- -- -- --  HGB 9.7* 10.3* 11.1* 11.6*  HCT 28.2* 29.7* 32.3* 33.3*  MCV 87.6 87.6 88.3 87.9  PLT 195 166 214 211   CBG:  Lab 01/18/12 1853 01/18/12 1645 01/18/12 1156 01/18/12 0746 01/17/12 2034  GLUCAP 221* 217* 243* 190* 219*   Microbiology Recent Results (from the past 240 hour(s))  URINE CULTURE     Status: Normal (Preliminary result)   Collection Time   01/17/12  3:52 AM      Component Value Range Status Comment   Specimen Description URINE, CLEAN CATCH   Final    Special Requests NONE   Final    Culture  Setup Time [id number]   Final    Colony Count >=100,000 COLONIES/ML   Final    Culture GRAM NEGATIVE RODS   Final    Report Status PENDING   Incomplete   CULTURE, BLOOD (ROUTINE X 2)     Status: Normal (Preliminary result)   Collection Time   01/17/12  4:26 PM      Component Value Range Status Comment   Specimen Description BLOOD LEFT ARM   Final    Special Requests BOTTLES DRAWN AEROBIC ONLY 1CC   Final    Culture  Setup Time [id number]   Final    Culture     Final    Value:        BLOOD CULTURE RECEIVED NO GROWTH TO DATE CULTURE WILL BE HELD FOR 5 DAYS BEFORE ISSUING A FINAL NEGATIVE REPORT   Report Status PENDING   Incomplete   CULTURE, BLOOD (ROUTINE X 2)     Status: Normal (Preliminary result)   Collection Time    01/17/12  4:50 PM      Component Value Range Status Comment   Specimen Description BLOOD LEFT HAND   Final    Special Requests BOTTLES DRAWN AEROBIC ONLY [facility]   Final    Culture  Setup Time [id number]   Final    Culture     Final    Value:        BLOOD CULTURE RECEIVED NO GROWTH TO DATE CULTURE WILL BE HELD FOR 5 DAYS BEFORE ISSUING A FINAL NEGATIVE REPORT   Report Status PENDING   Incomplete     Procedures and Diagnostic Studies:  Dg Chest 2 View 01/17/2012 IMPRESSION: Mild streaky opacity at the lung bases; atelectasis  versus infiltrate.  Hypoaeration results in interstitial and vascular crowding.  Original Report Authenticated By: Waneta Martins, M.D.    Nm Hepatobiliary 01/17/2012 IMPRESSION:  1.  Patent cystic duct without evidence for acute cholecystitis.  Original Report Authenticated By: Rosealee Albee, M.D.    Ct Abdomen Pelvis W Contrast 01/17/2012 IMPRESSION: Cholelithiasis without CT evidence for acute cholecystitis.  HIDA scan is the study of choice if clinical concern for acute cholecystitis persists.  Hepatic steatosis.  Normal appendix.  Original Report Authenticated By: Waneta Martins, M.D.    US Abdomen Limited 01/17/2012 IMPRESSION: Cholelithiasis without gallbladder wall thickening or pericholecystic fluid. HIDA scan could be obtained to evalute for acute cholecystitis if clinically warranted.  Hepatic steatosis.                   Original Report Authenticated By: Waneta Martins, M.D.    Ct Angio Chest W/cm &/or Wo Cm 01/18/2012  IMPRESSION:  1.  No evidence of pulmonary embolism on suboptimal exam.  IF continued concern consider V/Q  scan in future due to patient body habitus. 2.  New bibasilar atelectasis.  Cannot exclude infiltrate at the right lung base.  Original Report Authenticated By: Genevive Bi, M.D.     Scheduled Meds:    . enoxaparin  40 mg Subcutaneous Q24H  . feeding supplement  237 mL Oral BID BM  . insulin aspart  0-15 Units  Subcutaneous TID WC  . insulin aspart  0-5 Units Subcutaneous QHS  . insulin glargine  15 Units Subcutaneous QHS  . lisinopril  10 mg Oral Daily  . piperacillin-tazobactam (ZOSYN)  IV  3.375 g Intravenous Q8H  . tretinoin  45 mg/m2/day Oral BID WC  . DISCONTD: insulin glargine  10 Units Subcutaneous QHS   Continuous Infusions:    . sodium chloride 75 mL/hr at 01/19/12 0040      LOS: 2 days   Hillery Aldo, MD Pager [phone]  01/19/2012, 9:26 AM   Patient Teaching (information given to and reviewed with the patient): [name] 01/19/2012  Treatment team:   Dr. Hillery Aldo, Hospitalist (Internist)   Dr. Pierce Crane, Oncologist (aware of your admission)   Dr. Emelia Loron, Surgeon  Medical Issues and plan: Principal Problem:  *Fever with history of leukemia Blood cultures, urine cultures obtained.  Blood cultures are negative so far, but urine cultures growing bacteria.  The final culture results are not back yet so we have not yet identified the specific bacteria in your urine.  Status post CT scan and abdominal ultrasound which showed gallstones without evidence of gallbladder infection.  HIDA scan done 01/17/2012, negative for gallbladder infection Diet advanced to regular after HIDA scan results found to be negative..  On broad-spectrum antibiotics which will cover infections of the gallbladder, lungs and bladder.  Dr. Donnie Coffin saw you on 01/18/12 and asked me to have the surgeon's evaluate you.  At this time, they do not feel your pain is from your gallbladder. A CT scan of the chest was done 01/18/12 and this showed some right lower lung inflammation, which is likely the source of your pain (pleurisy).  There was no evidence of blood clots in your lungs. Dr. Donnie Coffin does recommend you continue on your medicines to treat leukemia. Toradol added to address ongoing pain and to target inflammation. Active Problems:  DIABETES-TYPE 2  Hemoglobin A1c on  11/25/2011 was 7.7%. Goal is less than 7%. This is a measure of how well your glucoses  are controlled over a 3 month period of time. Metformin on hold because this medicine can interact with the dyes given for studies and stress your kidneys.  Continue insulin therapy, which we will adjust based on your sugar readings. CBGs 190-243 on this therapy. High blood pressure Continue lisinopril. Blood pressure controlled. Fatty liver Recommend weight loss.  The dietician will evaluate you and provide you with diet instructions.   Anticipated discharge date:  1-3 days, depending on progress.

## 2012-01-19 NOTE — Progress Notes (Signed)
No real findings that gb is source, will sign off, please call with any questions

## 2012-01-20 ENCOUNTER — Ambulatory Visit: Payer: Self-pay

## 2012-01-20 DIAGNOSIS — E669 Obesity, unspecified: Secondary | ICD-10-CM

## 2012-01-20 DIAGNOSIS — C9201 Acute myeloblastic leukemia, in remission: Secondary | ICD-10-CM

## 2012-01-20 DIAGNOSIS — C9200 Acute myeloblastic leukemia, not having achieved remission: Secondary | ICD-10-CM

## 2012-01-20 DIAGNOSIS — K8020 Calculus of gallbladder without cholecystitis without obstruction: Secondary | ICD-10-CM

## 2012-01-20 DIAGNOSIS — R509 Fever, unspecified: Secondary | ICD-10-CM

## 2012-01-20 DIAGNOSIS — Z6841 Body Mass Index (BMI) 40.0 and over, adult: Secondary | ICD-10-CM | POA: Diagnosis present

## 2012-01-20 LAB — GLUCOSE, CAPILLARY
Glucose-Capillary: 263 mg/dL — ABNORMAL HIGH (ref 70–99)
Glucose-Capillary: 272 mg/dL — ABNORMAL HIGH (ref 70–99)
Glucose-Capillary: 215 mg/dL — ABNORMAL HIGH (ref 70–99)

## 2012-01-20 MED ORDER — INSULIN ASPART 100 UNIT/ML ~~LOC~~ SOLN
0.0000 [IU] | Freq: Every day | SUBCUTANEOUS | Status: DC
  Administered 2012-01-20: 2 [IU] via SUBCUTANEOUS

## 2012-01-20 MED ORDER — INSULIN ASPART 100 UNIT/ML ~~LOC~~ SOLN
4.0000 [IU] | Freq: Three times a day (TID) | SUBCUTANEOUS | Status: DC
  Administered 2012-01-20 – 2012-01-21 (×3): 4 [IU] via SUBCUTANEOUS

## 2012-01-20 MED ORDER — INSULIN ASPART 100 UNIT/ML ~~LOC~~ SOLN
0.0000 [IU] | Freq: Three times a day (TID) | SUBCUTANEOUS | Status: DC
  Administered 2012-01-20: 8 [IU] via SUBCUTANEOUS
  Administered 2012-01-20: 12 [IU] via SUBCUTANEOUS
  Administered 2012-01-21: 3 [IU] via SUBCUTANEOUS

## 2012-01-20 NOTE — Progress Notes (Signed)
ANTIBIOTIC CONSULT NOTE - Follow Up  Pharmacy Consult for Zosyn Indication: r/o cholecystitis  Allergies  Allergen Reactions  . Ultram (Tramadol Hcl) Nausea And Vomiting    Patient Measurements: Height: 5\' 2"  (157.5 cm) Weight: 275 lb (124.739 kg) IBW/kg (Calculated) : 50.1   Vital Signs: Temp: 98.4 F (36.9 C) (05/01 0440) Temp src: Oral (05/01 0440) BP: 144/85 mmHg (05/01 0440) Pulse Rate: 97  (05/01 0440) Intake/Output from previous day: 04/30 0701 - 05/01 0700 In: 2952.5 [P.O.:980; I.V.:1822.5; IV Piggyback:150] Out: 3650 [Urine:3650] Intake/Output from this shift:    Labs:  Basename 01/19/12 0545 01/18/12 0630  WBC 11.1* 13.5*  HGB 9.7* 10.3*  PLT 195 166  LABCREA -- --  CREATININE 0.63 0.57   Estimated Creatinine Clearance: 138 ml/min (by C-G formula based on Cr of 0.63).    Microbiology: Recent Results (from the past 720 hour(s))  WET PREP, GENITAL     Status: Abnormal   Collection Time   12/27/11  2:27 PM      Component Value Range Status Comment   Yeast Wet Prep HPF POC NONE SEEN  NONE SEEN  Final    Trich, Wet Prep NONE SEEN  NONE SEEN  Final    Clue Cells Wet Prep HPF POC NONE SEEN  NONE SEEN  Final    WBC, Wet Prep HPF POC FEW (*) NONE SEEN  Final   URINE CULTURE     Status: Normal   Collection Time   01/17/12  3:52 AM      Component Value Range Status Comment   Specimen Description URINE, CLEAN CATCH   Final    Special Requests NONE   Final    Culture  Setup Time [id number]   Final    Colony Count >=100,000 COLONIES/ML   Final    Culture KLEBSIELLA PNEUMONIAE   Final    Report Status 01/19/2012 FINAL   Final    Organism ID, Bacteria KLEBSIELLA PNEUMONIAE   Final   CULTURE, BLOOD (ROUTINE X 2)     Status: Normal (Preliminary result)   Collection Time   01/17/12  4:26 PM      Component Value Range Status Comment   Specimen Description BLOOD LEFT ARM   Final    Special Requests BOTTLES DRAWN AEROBIC ONLY 1CC   Final    Culture  Setup Time  [id number]   Final    Culture     Final    Value:        BLOOD CULTURE RECEIVED NO GROWTH TO DATE CULTURE WILL BE HELD FOR 5 DAYS BEFORE ISSUING A FINAL NEGATIVE REPORT   Report Status PENDING   Incomplete   CULTURE, BLOOD (ROUTINE X 2)     Status: Normal (Preliminary result)   Collection Time   01/17/12  4:50 PM      Component Value Range Status Comment   Specimen Description BLOOD LEFT HAND   Final    Special Requests BOTTLES DRAWN AEROBIC ONLY 2CC   Final    Culture  Setup Time [id number]   Final    Culture     Final    Value:        BLOOD CULTURE RECEIVED NO GROWTH TO DATE CULTURE WILL BE HELD FOR 5 DAYS BEFORE ISSUING A FINAL NEGATIVE REPORT   Report Status PENDING   Incomplete     Medical History: Past Medical History  Diagnosis Date  . Diabetes mellitus   . Hypertension   . Nephrolithiasis   .  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 Hepatic steatosis 01/17/2012  . Cholelithiasis 01/17/2012   Assessment:  25 YOF with acute promyelocytic leukemia discharged from 4/26 s/p arsenic and po tretinoin presented 4/28 c/o fever and abdominal pain.    D#4 Zosyn 3.375g IV q8h for cholelithiasis/CAP/UTI  Ucx = kleb pneumo sens to Zosyn  Plan:   Continue Zosyn 3.375g IV q8h  Continue to monitor and adjust as necessary  Gwen Her PharmD  [phone] 01/20/2012 8:17 AM

## 2012-01-20 NOTE — Progress Notes (Signed)
PROGRESS NOTE  TAMARIA DUNLEAVY BMW:[id number] DOB: 15-Dec-1985 DOA: 01/17/2012 PCP: Edd Arbour, MD, MD  Brief narrative: MEGA KINKADE is an 26 y.o. female with a PMH of APL under the care of Dr. Donnie Coffin for consolidation chemotherapy who presented to the hospital with a chief complaint of fever.  No obvious source has been identified, and the patient is being empirically treated with Zosyn to cover intra-abdominal infections and pneumonia.    Assessment/Plan: Principal Problem:  *Fever in a patient with APML and Cholelithiasis / CAP / UTI Blood cultures, urine cultures obtained. Urine cultures growing Klebsiella, Zosyn sensitive. Status post CT scan and abdominal ultrasound which showed cholelithiasis without gallbladder wall thickening.  Clinical exam shows right upper and lower quadrant pain, concerning for cholecystitis or right pleurisy.  HIDA scan done 01/17/2012, negative for cholecystitis. Diet advanced to regular after HIDA scan results found to be negative..  On empiric Zosyn to cover intra-abdominal pathogens as well as pneumonia and urinary tract infection.  Dr. Donnie Coffin notified of admission and saw the patient on 01/18/2012.  Continue Vesanoid.  Arsenic on hold. Because of ongoing severe right upper quadrant pain, surgery consultation was performed on 01/18/2012 with no indication for immediate surgical exploration. CT scan of the chest was obtained on 01/18/2012 to rule out pulmonary embolism or other source of her ongoing pain, and this showed a possible right lower lobe pneumonia. Continue Zosyn for now. Toradol added for/30/2013 for inflammation and better pain control. Patient reports improvement in her pain on this therapy. Active Problems:  DIABETES-TYPE 2  Hemoglobin A1c on 11/25/2011 was 7.7%.  Hold metformin.  Start moderate scale sliding scale insulin every 4 hours while n.p.o. which was switched to the moderate sliding scale insulin with 10 units of Lantus once her diet  was advanced. CBGs 123-256 on this therapy. Lantus increased to 15 units 01/18/2012, suboptimal glycemic control, so Lantus increased to 20 units 01/19/2012. Will add 4 units of meal coverage today to address ongoing hyperglycemia. No immediate plans to discharge patient on insulin therapy given that her hemoglobin A1c was 7.7% and metformin is currently on hold. HYPERTENSION, BENIGN ESSENTIAL  Continue lisinopril. Blood pressure controlled. Hepatic steatosis / Morbid obesity  BMI 50.4 Recommend weight loss.  Seen by dietitian on 01/18/2012 and provided with diet education.  Code Status: Full Family Communication: Family updated at bedside. Disposition Plan: Home, when stable  Medical Consultants:  Dr. Pierce Crane, Oncology.  Dr. Emelia Loron, Surgery  Other consultants:  Dietitian: Abundio Miu ordered twice a day.  Diabetes Coordinator  Antibiotics:  Zosyn 01/18/12--->   Subjective  Mrs. Denne is finally beginning to have improvement in her pain. She is more mobile. No nausea or vomiting. Bowels moving normally.   Objective    Interim History: Stable overnight.   Objective: Filed Vitals:   01/19/12 1411 01/19/12 1805 01/19/12 2100 01/20/12 0440  BP: 146/93 137/84 140/83 144/85  Pulse: 103 92 105 97  Temp: 98.6 F (37 C) 98.4 F (36.9 C) 98.8 F (37.1 C) 98.4 F (36.9 C)  TempSrc: Oral Oral Oral Oral  Resp: 20 20 19 17   Height:      Weight:      SpO2: 94% 97% 97% 92%    Intake/Output Summary (Last 24 hours) at 01/20/12 0954 Last data filed at 01/20/12 0600  Gross per 24 hour  Intake 2472.5 ml  Output   2850 ml  Net -377.5 ml    Exam: Gen:  Mild distress secondary to abdominal discomfort.  Cardiovascular:  RRR, No M/R/G Respiratory: Lungs diminished at the bases. Gastrointestinal: Abdomen soft, no rebound, less tender right upper quadrant. Extremities: No C/E/C    Data Reviewed: Basic Metabolic Panel:  Lab 01/19/12 9562 01/18/12 1857  01/18/12 0630 01/17/12 0435 01/15/12 0535  NA 134* -- 136 133* 138  K 3.6 -- 3.7 -- --  CL 100 -- 104 99 100  CO2 24 -- 25 21 25   GLUCOSE 248* -- 191* 274* 238*  BUN 12 -- 10 21 21   CREATININE 0.63 -- 0.57 0.59 0.58  CALCIUM 8.9 -- 8.5 8.8 9.7  MG -- 2.1 -- -- 2.1  PHOS -- -- -- -- --   GFR Estimated Creatinine Clearance: 138 ml/min (by C-G formula based on Cr of 0.63). Liver Function Tests:  Lab 01/19/12 0545 01/17/12 0435 01/15/12 0535  AST 20 19 10   ALT 29 16 14   ALKPHOS 102 105 110  BILITOT 0.7 0.5 0.5  PROT 6.3 6.4 7.1  ALBUMIN 2.8* 3.7 4.1   Coagulation profile  Lab 01/13/12 2200  INR 1.12  PROTIME --    CBC:  Lab 01/19/12 0545 01/18/12 0630 01/17/12 0435 01/13/12 2200  WBC 11.1* 13.5* 14.9* 14.4*  NEUTROABS -- -- -- --  HGB 9.7* 10.3* 11.1* 11.6*  HCT 28.2* 29.7* 32.3* 33.3*  MCV 87.6 87.6 88.3 87.9  PLT 195 166 214 211   CBG:  Lab 01/20/12 0743 01/19/12 2108 01/19/12 1646 01/19/12 1205 01/19/12 0740  GLUCAP 123* 256* 279* 187* 239*   Microbiology Recent Results (from the past 240 hour(s))  URINE CULTURE     Status: Normal   Collection Time   01/17/12  3:52 AM      Component Value Range Status Comment   Specimen Description URINE, CLEAN CATCH   Final    Special Requests NONE   Final    Culture  Setup Time [id number]   Final    Colony Count >=100,000 COLONIES/ML   Final    Culture KLEBSIELLA PNEUMONIAE   Final    Report Status 01/19/2012 FINAL   Final    Organism ID, Bacteria KLEBSIELLA PNEUMONIAE   Final   CULTURE, BLOOD (ROUTINE X 2)     Status: Normal (Preliminary result)   Collection Time   01/17/12  4:26 PM      Component Value Range Status Comment   Specimen Description BLOOD LEFT ARM   Final    Special Requests BOTTLES DRAWN AEROBIC ONLY 1CC   Final    Culture  Setup Time [id number]   Final    Culture     Final    Value:        BLOOD CULTURE RECEIVED NO GROWTH TO DATE CULTURE WILL BE HELD FOR 5 DAYS BEFORE ISSUING A FINAL NEGATIVE  REPORT   Report Status PENDING   Incomplete   CULTURE, BLOOD (ROUTINE X 2)     Status: Normal (Preliminary result)   Collection Time   01/17/12  4:50 PM      Component Value Range Status Comment   Specimen Description BLOOD LEFT HAND   Final    Special Requests BOTTLES DRAWN AEROBIC ONLY [facility]   Final    Culture  Setup Time [id number]   Final    Culture     Final    Value:        BLOOD CULTURE RECEIVED NO GROWTH TO DATE CULTURE WILL BE HELD FOR 5 DAYS BEFORE ISSUING A FINAL NEGATIVE REPORT   Report Status PENDING  Incomplete     Procedures and Diagnostic Studies:  Dg Chest 2 View 01/17/2012 IMPRESSION: Mild streaky opacity at the lung bases; atelectasis versus infiltrate.  Hypoaeration results in interstitial and vascular crowding.  Original Report Authenticated By: Waneta Martins, M.D.    Nm Hepatobiliary 01/17/2012 IMPRESSION:  1.  Patent cystic duct without evidence for acute cholecystitis.  Original Report Authenticated By: Rosealee Albee, M.D.    Ct Abdomen Pelvis W Contrast 01/17/2012 IMPRESSION: Cholelithiasis without CT evidence for acute cholecystitis.  HIDA scan is the study of choice if clinical concern for acute cholecystitis persists.  Hepatic steatosis.  Normal appendix.  Original Report Authenticated By: Waneta Martins, M.D.    US Abdomen Limited 01/17/2012 IMPRESSION: Cholelithiasis without gallbladder wall thickening or pericholecystic fluid. HIDA scan could be obtained to evalute for acute cholecystitis if clinically warranted.  Hepatic steatosis.                   Original Report Authenticated By: Waneta Martins, M.D.    Ct Angio Chest W/cm &/or Wo Cm 01/18/2012  IMPRESSION:  1.  No evidence of pulmonary embolism on suboptimal exam.  IF continued concern consider V/Q  scan in future due to patient body habitus. 2.  New bibasilar atelectasis.  Cannot exclude infiltrate at the right lung base.  Original Report Authenticated By: Genevive Bi, M.D.     Dg  Chest 2 View 01/19/2012  IMPRESSION: Cardiomegaly.  Low lung volumes with right base atelectasis.  Original Report Authenticated By: Cyndie Chime, M.D.     Scheduled Meds:    . enoxaparin  40 mg Subcutaneous Q24H  . feeding supplement  237 mL Oral BID BM  . insulin aspart  0-15 Units Subcutaneous TID WC  . insulin aspart  0-5 Units Subcutaneous QHS  . insulin glargine  20 Units Subcutaneous QHS  . ketorolac  30 mg Intravenous Q8H  . lisinopril  10 mg Oral Daily  . piperacillin-tazobactam (ZOSYN)  IV  3.375 g Intravenous Q8H  . tretinoin  45 mg/m2/day Oral BID WC   Continuous Infusions:    . sodium chloride 75 mL/hr at 01/19/12 1329      LOS: 3 days   Hillery Aldo, MD Pager [phone]  01/20/2012, 9:54 AM   Patient Teaching (information given to and reviewed with the patient): ANAE HAMS 01/20/2012  Treatment team:   Dr. Hillery Aldo, Hospitalist (Internist)   Dr. Pierce Crane, Oncologist (aware of your admission)   Dr. Emelia Loron, Surgeon  Medical Issues and plan: Principal Problem:  *Fever with history of leukemia Blood cultures, urine cultures obtained.  Blood cultures are negative so far, but urine cultures growing bacteria.  The final culture results are not back yet so we have not yet identified the specific bacteria in your urine.  Status post CT scan and abdominal ultrasound which showed gallstones without evidence of gallbladder infection.  HIDA scan done 01/17/2012, negative for gallbladder infection Diet advanced to regular after HIDA scan results found to be negative..  On broad-spectrum antibiotics which will cover infections of the gallbladder, lungs and bladder.  Dr. Donnie Coffin saw you on 01/18/12 and asked me to have the surgeon's evaluate you.  At this time, they do not feel your pain is from your gallbladder. A CT scan of the chest was done 01/18/12 and this showed some right lower lung inflammation, which is likely the source of your  pain (pleurisy).  There was no evidence of blood clots  in your lungs. Dr. Donnie Coffin does recommend you continue on your medicines to treat leukemia. Toradol added to address ongoing pain and to target inflammation. No fevers over the past 24 hours and her white blood cell count continues to come down. Chest x-ray done 01/19/2012 shows a small amount of fluid at the right lung base, which is consistent with pleurisy. Active Problems:  DIABETES-TYPE 2  Hemoglobin A1c on 11/25/2011 was 7.7%. Goal is less than 7%. This is a measure of how well your glucoses are controlled over a 3 month period of time. Metformin on hold because this medicine can interact with the dyes given for studies and stress your kidneys.  Continue insulin therapy, which we will adjust based on your sugar readings. CBGs 123-279 on this therapy. High blood pressure Continue lisinopril. Blood pressure controlled. Fatty liver Recommend weight loss.  The dietician will evaluate you and provide you with diet instructions.   Anticipated discharge date:  1-2 days, depending on progress.

## 2012-01-20 NOTE — Progress Notes (Signed)
Subjective: 26 year-old Afro-American woman with no history of APM L. , status post recent arsenic infusion with Vesanoid orally , discharge from hospital stable condition re-admitted 24 platelets fever and right upper quadrant pain.   Objective: Temp:  [98.4 F (36.9 C)-98.8 F (37.1 C)] 98.8 F (37.1 C) (04/30 2100) Pulse Rate:  [92-105] 105  (04/30 2100) Resp:  [18-20] 19  (04/30 2100) BP: (129-146)/(82-93) 140/83 mmHg (04/30 2100) SpO2:  [94 %-97 %] 97 % (04/30 2100)  General appearance: alert, cooperative and appears stated age Head: Normocephalic, without obvious abnormality, atraumatic Throat: lips, mucosa, and tongue normal; teeth and gums normal Resp: clear to auscultation bilaterally and normal percussion bilaterally Breasts: normal appearance, no masses or tenderness Cardio: regular rate and rhythm, S1, S2 normal, no murmur, click, rub or gallop GI: soft, bowel sounds are present, she has tenderness or upper quadrant with some mild rebound. Extremities: extremities normal, atraumatic, no cyanosis or edema Pulses: 2+ and symmetric Lymph nodes: Cervical, supraclavicular, and axillary nodes normal. Neurologic: Grossly normal  Labs:  Basename 01/19/12 0545 01/18/12 0630  WBC 11.1* 13.5*  HGB 9.7* 10.3*  HCT 28.2* 29.7*  PLT 195 166    Basename 01/19/12 0545 01/18/12 0630  NA 134* 136  K 3.6 3.7  CL 100 104  CO2 24 25  GLUCOSE 248* 191*  BUN 12 10  CREATININE 0.63 0.57  CALCIUM 8.9 8.5    Results for orders placed during the hospital encounter of 01/17/12  URINE CULTURE     Status: Normal   Collection Time   01/17/12  3:52 AM      Component Value Range Status Comment   Specimen Description URINE, CLEAN CATCH   Final    Special Requests NONE   Final    Culture  Setup Time [id number]   Final    Colony Count >=100,000 COLONIES/ML   Final    Culture KLEBSIELLA PNEUMONIAE   Final    Report Status 01/19/2012 FINAL   Final    Organism ID, Bacteria KLEBSIELLA  PNEUMONIAE   Final   CULTURE, BLOOD (ROUTINE X 2)     Status: Normal (Preliminary result)   Collection Time   01/17/12  4:26 PM      Component Value Range Status Comment   Specimen Description BLOOD LEFT ARM   Final    Special Requests BOTTLES DRAWN AEROBIC ONLY 1CC   Final    Culture  Setup Time [id number]   Final    Culture     Final    Value:        BLOOD CULTURE RECEIVED NO GROWTH TO DATE CULTURE WILL BE HELD FOR 5 DAYS BEFORE ISSUING A FINAL NEGATIVE REPORT   Report Status PENDING   Incomplete   CULTURE, BLOOD (ROUTINE X 2)     Status: Normal (Preliminary result)   Collection Time   01/17/12  4:50 PM      Component Value Range Status Comment   Specimen Description BLOOD LEFT HAND   Final    Special Requests BOTTLES DRAWN AEROBIC ONLY [facility]   Final    Culture  Setup Time [id number]   Final    Culture     Final    Value:        BLOOD CULTURE RECEIVED NO GROWTH TO DATE CULTURE WILL BE HELD FOR 5 DAYS BEFORE ISSUING A FINAL NEGATIVE REPORT   Report Status PENDING   Incomplete      Dg Chest 2 View  01/19/2012  *  RADIOLOGY REPORT*  Clinical Data: Shortness of breath.  CHEST - 2 VIEW  Comparison: 01/17/2012  Findings: Low lung volumes.  Right base atelectasis.  Mild cardiomegaly.  Left lung is clear.  No visible effusions.  No acute bony abnormality.  IMPRESSION: Cardiomegaly.  Low lung volumes with right base atelectasis.  Original Report Authenticated By: Cyndie Chime, M.D.   Ct Angio Chest W/cm &/or Wo Cm  01/18/2012  *RADIOLOGY REPORT*  Clinical Data: Post chemotherapy.  Right upper quadrant pain.  CT ANGIOGRAPHY CHEST  Technique:  Multidetector CT imaging of the chest using the standard protocol during bolus administration of intravenous contrast. Multiplanar reconstructed images including MIPs were obtained and reviewed to evaluate the vascular anatomy.  Contrast:  100 ml Omnipaque 300  Comparison: CT  abdomen 01/17/2012  Findings: Exam is suboptimal due to patient body habitus.  No  filling defects within the main pulmonary arteries to suggest acute pulmonary embolism.  The proximal lobar  arteries are clear of acute pulmonary embolism.  The segmental pulmonary arteries are poorly evaluated as above.  There are no acute findings aorta great vessels.  No pericardial fluid.  The esophagus is normal.  There is new mild air space disease and effusion at the right lung base.  Small focus of atelectasis at the left lung base.  No pneumothorax or pulmonary edema.  Airways appear normal.  Limited view of the upper abdomen is unremarkable.  No axillary supraclavicular adenopathy.  A port in the right anterior chest wall.  Subcutaneous nodule in the chest wall is likely and subcutaneous cyst/sebaceous cyst (17 mm (image 17).  IMPRESSION:  1.  No evidence of pulmonary embolism on suboptimal exam.  IF continued concern consider V/Q  scan in future due to patient body habitus. 2.  New bibasilar atelectasis.  Cannot exclude infiltrate at the right lung base.  Original Report Authenticated By: Genevive Bi, M.D.  :  Pathology: n/a  Principal Problem:  *Fever Active Problems:  APML (acute promyelocytic leukemia)  DIABETES-TYPE 2  HYPERTENSION, BENIGN ESSENTIAL  UTI (lower urinary tract infection)  Cholelithiasis  Hepatic steatosis  CAP (community acquired pneumonia) :  Disposition:-year-old with history of APM L. status post treatment now presenting with right upper quadrant pain, fever. She has a pleuritic component to this problem. She's had a CT angiogram which was negative for pulmonary embolism. Today her exam shows slight degree of discomfort in the right upper quadrant with a pleuritic component. She does have atelectasis on chest x-ray the right base. I suspect that this process might be a pleuritic type pneumonia. Of note is that she does have your positive urine for Klebsiella. I have restarted her Vesanoid and we'll continue monitoring.  Tresea Mall M.D. FRCP C.

## 2012-01-21 ENCOUNTER — Ambulatory Visit: Payer: Self-pay

## 2012-01-21 ENCOUNTER — Encounter: Payer: Self-pay | Admitting: Oncology

## 2012-01-21 DIAGNOSIS — K8020 Calculus of gallbladder without cholecystitis without obstruction: Secondary | ICD-10-CM

## 2012-01-21 DIAGNOSIS — J90 Pleural effusion, not elsewhere classified: Secondary | ICD-10-CM | POA: Diagnosis present

## 2012-01-21 DIAGNOSIS — C9201 Acute myeloblastic leukemia, in remission: Secondary | ICD-10-CM

## 2012-01-21 DIAGNOSIS — R509 Fever, unspecified: Secondary | ICD-10-CM

## 2012-01-21 DIAGNOSIS — E669 Obesity, unspecified: Secondary | ICD-10-CM

## 2012-01-21 LAB — GLUCOSE, CAPILLARY: Glucose-Capillary: 175 mg/dL — ABNORMAL HIGH (ref 70–99)

## 2012-01-21 MED ORDER — METRONIDAZOLE 500 MG PO TABS: 500.0000 mg | ORAL_TABLET | Freq: Three times a day (TID) | ORAL | Status: DC

## 2012-01-21 MED ORDER — CIPROFLOXACIN HCL 500 MG PO TABS: 500.0000 mg | ORAL_TABLET | Freq: Two times a day (BID) | ORAL | Status: DC

## 2012-01-21 MED ORDER — IBUPROFEN 800 MG PO TABS: 800.0000 mg | ORAL_TABLET | Freq: Three times a day (TID) | ORAL | Status: AC | PRN

## 2012-01-21 MED ORDER — HEPARIN SOD (PORK) LOCK FLUSH 100 UNIT/ML IV SOLN
INTRAVENOUS | Status: AC
  Filled 2012-01-21: qty 5

## 2012-01-21 MED ORDER — OXYCODONE HCL 10 MG PO TABS: 10.0000 mg | ORAL_TABLET | ORAL | Status: DC | PRN

## 2012-01-21 NOTE — Discharge Summary (Signed)
Physician Discharge Summary  Patient ID: Melissa Washington MRN: [id number] DOB/AGE: 1986-06-10 25 y.o.  Admit date: 01/17/2012 Discharge date: 01/21/2012  Primary Care Physician:  Edd Arbour, MD, MD Oncologist: Dr. Pierce Crane   Discharge Diagnoses:    Present on Admission:  .Fever .APML (acute promyelocytic leukemia) .DIABETES-TYPE 2 .HYPERTENSION, BENIGN ESSENTIAL .Cholelithiasis .Hepatic steatosis .CAP (community acquired pneumonia) .UTI (lower urinary tract infection) .Morbid obesity with BMI of 50.0-59.9, adult .Pleurisy with effusion  Discharge Medications:  Medication List  As of 01/21/2012  9:08 AM   TAKE these medications         ciprofloxacin 500 MG tablet   Commonly known as: CIPRO   Take 1 tablet (500 mg total) by mouth 2 (two) times daily.      ibuprofen 800 MG tablet   Commonly known as: ADVIL,MOTRIN   Take 1 tablet (800 mg total) by mouth every 8 (eight) hours as needed for pain.      lidocaine-prilocaine cream   Commonly known as: EMLA   Apply topically as needed.      lisinopril 10 MG tablet   Commonly known as: PRINIVIL,ZESTRIL   Take 1 tablet (10 mg total) by mouth daily.      metFORMIN 1000 MG tablet   Commonly known as: GLUCOPHAGE   Take 1 tablet (1,000 mg total) by mouth daily with breakfast.      metroNIDAZOLE 500 MG tablet   Commonly known as: FLAGYL   Take 1 tablet (500 mg total) by mouth 3 (three) times daily.      Oxycodone HCl 10 MG Tabs   Take 1 tablet (10 mg total) by mouth every 4 (four) hours as needed for pain.      promethazine 12.5 MG tablet   Commonly known as: PHENERGAN   Take 1 tablet (12.5 mg total) by mouth every 6 (six) hours as needed for nausea.      tretinoin 10 MG capsule   Commonly known as: VESANOID   Take 5 capsules (50 mg total) by mouth 2 (two) times daily with a meal.             Disposition and Follow-up: The patient is being discharged home. She has followup scheduled with her oncologist on  Monday. She is instructed to followup with her primary care physician as needed.   Medical Consults: Dr. Pierce Crane, Oncology.  Dr. Emelia Loron, Surgery  Other Consults: Dietitian: Ensure ordered twice a day.  Diabetes Coordinator   Procedures and Diagnostic Studies:  Dg Chest 2 View 01/17/2012 IMPRESSION: Mild streaky opacity at the lung bases; atelectasis versus infiltrate. Hypoaeration results in interstitial and vascular crowding. Original Report Authenticated By: Waneta Martins, M.D.  Nm Hepatobiliary 01/17/2012 IMPRESSION: 1. Patent cystic duct without evidence for acute cholecystitis. Original Report Authenticated By: Rosealee Albee, M.D.  Ct Abdomen Pelvis W Contrast 01/17/2012 IMPRESSION: Cholelithiasis without CT evidence for acute cholecystitis. HIDA scan is the study of choice if clinical concern for acute cholecystitis persists. Hepatic steatosis. Normal appendix. Original Report Authenticated By: Waneta Martins, M.D.  US Abdomen Limited 01/17/2012 IMPRESSION: Cholelithiasis without gallbladder wall thickening or pericholecystic fluid. HIDA scan could be obtained to evalute for acute cholecystitis if clinically warranted. Hepatic steatosis. Original Report Authenticated By: Waneta Martins, M.D.  Ct Angio Chest W/cm &/or Wo Cm 01/18/2012 IMPRESSION: 1. No evidence of pulmonary embolism on suboptimal exam. IF continued concern consider V/Q scan in future due to patient body habitus. 2. New bibasilar atelectasis. Cannot exclude infiltrate  at the right lung base. Original Report Authenticated By: Genevive Bi, M.D.  Dg Chest 2 View 01/19/2012 IMPRESSION: Cardiomegaly. Low lung volumes with right base atelectasis. Original Report Authenticated By: Cyndie Chime, M.D.    Discharge Laboratory Values: Basic Metabolic Panel:  Lab 01/19/12 1610 01/18/12 1857 01/18/12 0630 01/17/12 0435 01/15/12 0535  NA 134* -- 136 133* 138  K 3.6 -- 3.7 -- --  CL 100 -- 104 99 100    CO2 24 -- 25 21 25   GLUCOSE 248* -- 191* 274* 238*  BUN 12 -- 10 21 21   CREATININE 0.63 -- 0.57 0.59 0.58  CALCIUM 8.9 -- 8.5 8.8 9.7  MG -- 2.1 -- -- 2.1  PHOS -- -- -- -- --   GFR Estimated Creatinine Clearance: 138 ml/min (by C-G formula based on Cr of 0.63). Liver Function Tests:  Lab 01/19/12 0545 01/17/12 0435 01/15/12 0535  AST 20 19 10   ALT 29 16 14   ALKPHOS 102 105 110  BILITOT 0.7 0.5 0.5  PROT 6.3 6.4 7.1  ALBUMIN 2.8* 3.7 4.1    Lab 01/19/12 0545  LIPASE 57  AMYLASE --    CBC:  Lab 01/19/12 0545 01/18/12 0630 01/17/12 0435  WBC 11.1* 13.5* 14.9*  NEUTROABS -- -- --  HGB 9.7* 10.3* 11.1*  HCT 28.2* 29.7* 32.3*  MCV 87.6 87.6 88.3  PLT 195 166 214   CBG:  Lab 01/21/12 0744 01/20/12 2128 01/20/12 1757 01/20/12 1259 01/20/12 0743  GLUCAP 175* 215* 272* 263* 123*   D-Dimer  Basename 01/18/12 1417  DDIMER 1.66*   Microbiology Recent Results (from the past 240 hour(s))  URINE CULTURE     Status: Normal   Collection Time   01/17/12  3:52 AM      Component Value Range Status Comment   Specimen Description URINE, CLEAN CATCH   Final    Special Requests NONE   Final    Culture  Setup Time [id number]   Final    Colony Count >=100,000 COLONIES/ML   Final    Culture KLEBSIELLA PNEUMONIAE   Final    Report Status 01/19/2012 FINAL   Final    Organism ID, Bacteria KLEBSIELLA PNEUMONIAE   Final   CULTURE, BLOOD (ROUTINE X 2)     Status: Normal (Preliminary result)   Collection Time   01/17/12  4:26 PM      Component Value Range Status Comment   Specimen Description BLOOD LEFT ARM   Final    Special Requests BOTTLES DRAWN AEROBIC ONLY 1CC   Final    Culture  Setup Time [id number]   Final    Culture     Final    Value:        BLOOD CULTURE RECEIVED NO GROWTH TO DATE CULTURE WILL BE HELD FOR 5 DAYS BEFORE ISSUING A FINAL NEGATIVE REPORT   Report Status PENDING   Incomplete   CULTURE, BLOOD (ROUTINE X 2)     Status: Normal (Preliminary result)    Collection Time   01/17/12  4:50 PM      Component Value Range Status Comment   Specimen Description BLOOD LEFT HAND   Final    Special Requests BOTTLES DRAWN AEROBIC ONLY [facility]   Final    Culture  Setup Time [id number]   Final    Culture     Final    Value:        BLOOD CULTURE RECEIVED NO GROWTH TO DATE CULTURE WILL BE HELD FOR 5  DAYS BEFORE ISSUING A FINAL NEGATIVE REPORT   Report Status PENDING   Incomplete      Brief H and P: For complete details please refer to admission H and P, but in brief, Melissa Washington is an 26 y.o. female with a PMH of APL under the care of Dr. Donnie Coffin for consolidation chemotherapy who presented to the hospital with a chief complaint of fever.  No definitive source was immediately identified and she therefore was referred for hospital admission for further evaluation and treatment.  Physical Exam at Discharge: BP 125/86  Pulse 78  Temp(Src) 98.5 F (36.9 C) (Oral)  Resp 17  Ht 5\' 2"  (1.575 m)  Wt 124.739 kg (275 lb)  BMI 50.30 kg/m2  SpO2 94%  LMP 12/28/2011 Gen:  NAD Cardiovascular:  RRR, No M/R/G Respiratory: Lungs CTAB Gastrointestinal: Abdomen soft, NT/ND with normal active bowel sounds. Extremities: No C/E/C  Hospital Course:  Principal Problem:  *Fever in a patient with APML and Cholelithiasis / CAP / UTI / Pleurisy with effusion Blood cultures, urine cultures obtained. Urine cultures growing Klebsiella, Zosyn and Cipro sensitive.  Status post CT scan and abdominal ultrasound which showed cholelithiasis without gallbladder wall thickening.  Clinical exam showed right upper and lower quadrant pain, concerning for cholecystitis or right pleurisy.  HIDA scan done 01/17/2012, negative for cholecystitis.  Diet advanced to regular after HIDA scan results found to be negative..  On empiric Zosyn to cover intra-abdominal pathogens as well as pneumonia and urinary tract infection.  Dr. Donnie Coffin notified of admission and saw the patient on 01/18/2012.  Continue Vesanoid. Arsenic on hold.  Because of ongoing severe right upper quadrant pain, surgery consultation was performed on 01/18/2012 with no indication for immediate surgical exploration.  CT scan of the chest was obtained on 01/18/2012 to rule out pulmonary embolism or other source of her ongoing pain, and this showed a possible right lower lobe pneumonia.  Zosyn continued through 01/21/2012.  Toradol added 01/19/2012 for inflammation and better pain control. Patient reports improvement in her pain on this therapy. Discharged on an additional 3 days of therapy with Cipro/Flagyl to treat urinary tract infection and any residual intra-abdominal infection. Active Problems:  DIABETES-TYPE 2  Hemoglobin A1c on 11/25/2011 was 7.7%.  Metformin held on admission.  Treated with a combination of Lantus and NovoLog sliding scale with meal coverage while in the hospital.  No immediate plans to discharge patient on insulin therapy given that her hemoglobin A1c was 7.7% and metformin is currently on hold. Resume metformin at discharge with recommendations for close followup by primary care physician for assessment of glycemic control. HYPERTENSION, BENIGN ESSENTIAL  Maintained on lisinopril.  Blood pressure controlled. Hepatic steatosis / Morbid obesity  BMI 50.4  Recommend weight loss.  Seen by dietitian on 01/18/2012 and provided with diet education.  Recommendations for hospital follow-up: 1. Close followup by primary care physician to assess glycemic control.  Diet:  Carbohydrate modified, calorie restricted.  Activity:  Increase activity slowly.  Condition at Discharge:   Improved.  Time spent on Discharge:  35 minutes.  Signed: Dr. Trula Ore Robbi Scurlock Pager [phone] 01/21/2012, 9:08 AM

## 2012-01-21 NOTE — Progress Notes (Signed)
Patient received four prescriptions from [facility] on 01/18/12 $12.00,her remaninig balance [facility] $385.00.

## 2012-01-22 ENCOUNTER — Ambulatory Visit: Payer: Self-pay

## 2012-01-23 ENCOUNTER — Emergency Department (HOSPITAL_COMMUNITY): Payer: Medicaid Other

## 2012-01-23 ENCOUNTER — Inpatient Hospital Stay (HOSPITAL_COMMUNITY)
Admission: EM | Admit: 2012-01-23 | Discharge: 2012-01-25 | DRG: 392 | Disposition: A | Discharge status: Home / Self Care | Payer: Medicaid Other | Attending: Family Medicine | Admitting: Family Medicine

## 2012-01-23 DIAGNOSIS — R509 Fever, unspecified: Secondary | ICD-10-CM

## 2012-01-23 DIAGNOSIS — E119 Type 2 diabetes mellitus without complications: Secondary | ICD-10-CM | POA: Diagnosis present

## 2012-01-23 DIAGNOSIS — Z79899 Other long term (current) drug therapy: Secondary | ICD-10-CM

## 2012-01-23 DIAGNOSIS — K810 Acute cholecystitis: Secondary | ICD-10-CM

## 2012-01-23 DIAGNOSIS — K8020 Calculus of gallbladder without cholecystitis without obstruction: Secondary | ICD-10-CM | POA: Diagnosis present

## 2012-01-23 DIAGNOSIS — C9201 Acute myeloblastic leukemia, in remission: Secondary | ICD-10-CM | POA: Diagnosis present

## 2012-01-23 DIAGNOSIS — L2089 Other atopic dermatitis: Secondary | ICD-10-CM

## 2012-01-23 DIAGNOSIS — C9240 Acute promyelocytic leukemia, not having achieved remission: Secondary | ICD-10-CM

## 2012-01-23 DIAGNOSIS — I10 Essential (primary) hypertension: Secondary | ICD-10-CM | POA: Diagnosis present

## 2012-01-23 DIAGNOSIS — L0291 Cutaneous abscess, unspecified: Secondary | ICD-10-CM

## 2012-01-23 DIAGNOSIS — N6009 Solitary cyst of unspecified breast: Secondary | ICD-10-CM

## 2012-01-23 DIAGNOSIS — K760 Fatty (change of) liver, not elsewhere classified: Secondary | ICD-10-CM | POA: Diagnosis present

## 2012-01-23 DIAGNOSIS — N390 Urinary tract infection, site not specified: Secondary | ICD-10-CM | POA: Diagnosis present

## 2012-01-23 DIAGNOSIS — M2620 Unspecified anomaly of dental arch relationship: Secondary | ICD-10-CM

## 2012-01-23 DIAGNOSIS — Z6841 Body Mass Index (BMI) 40.0 and over, adult: Secondary | ICD-10-CM

## 2012-01-23 DIAGNOSIS — J189 Pneumonia, unspecified organism: Secondary | ICD-10-CM

## 2012-01-23 DIAGNOSIS — R109 Unspecified abdominal pain: Secondary | ICD-10-CM

## 2012-01-23 DIAGNOSIS — R112 Nausea with vomiting, unspecified: Secondary | ICD-10-CM

## 2012-01-23 DIAGNOSIS — N12 Tubulo-interstitial nephritis, not specified as acute or chronic: Secondary | ICD-10-CM

## 2012-01-23 DIAGNOSIS — J90 Pleural effusion, not elsewhere classified: Secondary | ICD-10-CM

## 2012-01-23 DIAGNOSIS — R1011 Right upper quadrant pain: Principal | ICD-10-CM | POA: Diagnosis present

## 2012-01-23 DIAGNOSIS — K7689 Other specified diseases of liver: Secondary | ICD-10-CM | POA: Diagnosis present

## 2012-01-23 DIAGNOSIS — E1165 Type 2 diabetes mellitus with hyperglycemia: Secondary | ICD-10-CM | POA: Diagnosis present

## 2012-01-23 LAB — COMPREHENSIVE METABOLIC PANEL
ALT: 58 U/L — ABNORMAL HIGH (ref 0–35)
CO2: 24 mEq/L (ref 19–32)
Calcium: 9.9 mg/dL (ref 8.4–10.5)
GFR calc Af Amer: >90 mL/min (ref >90)
GFR calc non Af Amer: >90 mL/min (ref >90)
Glucose, Bld: 141 mg/dL — ABNORMAL HIGH (ref 70–99)
Sodium: 138 mEq/L (ref 135–145)
Total Bilirubin: 0.6 mg/dL (ref 0.3–1.2)

## 2012-01-23 LAB — CULTURE, BLOOD (ROUTINE X 2)
Culture  Setup Time: 201304282119
Culture: NO GROWTH

## 2012-01-23 LAB — URINALYSIS, ROUTINE W REFLEX MICROSCOPIC
Ketones, ur: NEGATIVE mg/dL
Protein, ur: 30 mg/dL — AB
Urobilinogen, UA: 0.2 mg/dL (ref 0.0–1.0)

## 2012-01-23 LAB — DIFFERENTIAL
Basophils Absolute: 0 10*3/uL (ref 0.0–0.1)
Basophils Relative: 0 % (ref 0–1)
Eosinophils Absolute: 0.3 10*3/uL (ref 0.0–0.7)
Neutrophils Relative %: 77 % (ref 43–77)

## 2012-01-23 LAB — CBC
MCH: 29.9 pg (ref 26.0–34.0)
MCHC: 33.8 g/dL (ref 30.0–36.0)
Platelets: 449 10*3/uL — ABNORMAL HIGH (ref 150–400)
RBC: 3.84 MIL/uL — ABNORMAL LOW (ref 3.87–5.11)
RDW: 15.7 % — ABNORMAL HIGH (ref 11.5–15.5)

## 2012-01-23 LAB — URINE MICROSCOPIC-ADD ON

## 2012-01-23 MED ORDER — ONDANSETRON HCL 4 MG/2ML IJ SOLN: 4.0000 mg | Freq: Three times a day (TID) | INTRAMUSCULAR | Status: AC | PRN

## 2012-01-23 MED ORDER — ONDANSETRON 4 MG PO TBDP
4.0000 mg | ORAL_TABLET | Freq: Once | ORAL | Status: AC
  Administered 2012-01-23: 4 mg via ORAL
  Filled 2012-01-23: qty 1

## 2012-01-23 MED ORDER — SODIUM CHLORIDE 0.9 % IV SOLN: Freq: Once | INTRAVENOUS | Status: DC

## 2012-01-23 MED ORDER — MORPHINE SULFATE 4 MG/ML IJ SOLN
4.0000 mg | Freq: Once | INTRAMUSCULAR | Status: AC
  Administered 2012-01-23: 4 mg via INTRAVENOUS
  Filled 2012-01-23: qty 1

## 2012-01-23 MED ORDER — SODIUM CHLORIDE 0.9 % IV BOLUS (SEPSIS)
1000.0000 mL | Freq: Once | INTRAVENOUS | Status: AC
  Administered 2012-01-23: 1000 mL via INTRAVENOUS

## 2012-01-23 MED ORDER — ONDANSETRON HCL 4 MG/2ML IJ SOLN
4.0000 mg | Freq: Once | INTRAMUSCULAR | Status: AC
  Administered 2012-01-23: 4 mg via INTRAVENOUS
  Filled 2012-01-23: qty 2

## 2012-01-23 MED ORDER — HYDROMORPHONE HCL PF 1 MG/ML IJ SOLN: 0.5000 mg | INTRAMUSCULAR | Status: AC | PRN

## 2012-01-23 NOTE — ED Notes (Addendum)
Pt has leukemia, started chemotherapy 68month ago, last chemo 1 week ago, reports nausea, vomiting and diarrhea x 24 hours, sts never felt nausea in past from Chemotherapy. Pt was also dx with gallstone in her last visit here, pain at RUQ, 8/10, DM II

## 2012-01-23 NOTE — ED Notes (Signed)
WUJ:[id number]<JY> Expected date:<BR> Expected time: 3:22 PM<BR> Means of arrival:Ambulance<BR> Comments:<BR> PTAR 35 -- N/V

## 2012-01-23 NOTE — ED Notes (Signed)
Pt aware of the need for a urine sample however, unable to void at this time. 

## 2012-01-23 NOTE — ED Notes (Signed)
CBG registered 126 on ED Glucometer

## 2012-01-23 NOTE — ED Provider Notes (Signed)
History     CSN: [id number]  Arrival date & time 01/23/12  1524   First MD Initiated Contact with Patient 01/23/12 1606      Chief Complaint  Patient presents with  . Nausea  . Emesis    (Consider location/radiation/quality/duration/timing/severity/associated sxs/prior treatment) HPI History from patient. 26 year old female who is actively receiving chemotherapy for leukemia presents with abdominal pain, nausea, vomiting. Last chemotherapy treatment was about a week ago. She reports that she was hospitalized about a week ago with abdominal pain in the right upper quadrant. Several different imaging studies were performed, including a CT, ultrasound, and a nuclear medicine hepatobiliary study, which showed cholelithiasis without evidence for cholecystitis. Yesterday, she developed nausea and vomiting, and states "I can't keep anything down." Developed increasing right upper quadrant pain as well. She denies any urinary symptoms, vaginal discharge, vaginal bleeding, change in bowel movements. Denies fever or chills.  She does state she was started on an antibiotic for UTI on hospital discharge which she took to completion (review of chart indicates that urine cx grew out Klebsiella pneumo). Was put on Cipro and Flagyl.  Past Medical History  Diagnosis Date  . Diabetes mellitus   . Hypertension   . Nephrolithiasis   . 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 Hepatic steatosis 01/17/2012  . Cholelithiasis 01/17/2012    Past Surgical History  Procedure Date  . Portacath placement     [facility]    Family History  Problem Relation Age of Onset  . Kidney disease Mother   . Kidney disease Maternal Grandmother   . Diabetes Maternal Grandmother   . Hypertension Mother   . Diabetes Father     History  Substance Use Topics  . Smoking status: Never Smoker   . Smokeless tobacco: Not on file  . Alcohol Use: No    OB History    Grav Para Term Preterm Abortions TAB SAB  Ect Mult Living                  Review of Systems  Constitutional: Negative for fever, chills, activity change and appetite change.  HENT: Negative.   Respiratory: Negative for chest tightness and shortness of breath.   Cardiovascular: Negative for chest pain and palpitations.  Gastrointestinal: Positive for nausea, vomiting and abdominal pain. Negative for diarrhea, constipation and blood in stool.  Genitourinary: Negative for dysuria.  Musculoskeletal: Negative for myalgias.  Skin: Negative for color change and rash.  Neurological: Negative for dizziness, weakness and headaches.    Allergies  Ultram  Home Medications   Current Outpatient Rx  Name Route Sig Dispense Refill  . CIPROFLOXACIN HCL 500 MG PO TABS Oral Take 1 tablet (500 mg total) by mouth 2 (two) times daily. 6 tablet 0  . IBUPROFEN 800 MG PO TABS Oral Take 1 tablet (800 mg total) by mouth every 8 (eight) hours as needed for pain. 30 tablet 0  . LIDOCAINE-PRILOCAINE 2.5-2.5 % EX CREA Topical Apply topically as needed. 30 g 0  . LISINOPRIL 10 MG PO TABS Oral Take 1 tablet (10 mg total) by mouth daily. 30 tablet 4  . METFORMIN HCL 1000 MG PO TABS Oral Take 1 tablet (1,000 mg total) by mouth daily with breakfast. 90 tablet 4  . METRONIDAZOLE 500 MG PO TABS Oral Take 1 tablet (500 mg total) by mouth 3 (three) times daily. 9 tablet 0  . OXYCODONE HCL 10 MG PO TABS Oral Take 1 tablet (10 mg  total) by mouth every 4 (four) hours as needed for pain. 30 tablet 0  . PROMETHAZINE HCL 25 MG PO TABS Oral Take 25 mg by mouth every 6 (six) hours as needed. For nausea    . TRETINOIN 10 MG PO CAPS Oral Take 5 capsules (50 mg total) by mouth 2 (two) times daily with a meal. 100 capsule 4    BP 117/88  Pulse 83  Temp(Src) 98.4 F (36.9 C) (Oral)  Resp 17  SpO2 98%  LMP 12/28/2011  Physical Exam  Nursing note and vitals reviewed. Constitutional: She is oriented to person, place, and time. She appears well-developed and  well-nourished. No distress.  HENT:  Head: Normocephalic and atraumatic.  Right Ear: External ear normal.  Left Ear: External ear normal.  Mouth/Throat: Oropharynx is clear and moist. No oropharyngeal exudate.  Eyes: EOM are normal. Pupils are equal, round, and reactive to light.  Neck: Normal range of motion.  Cardiovascular: Normal rate, regular rhythm and normal heart sounds.   Pulmonary/Chest: Effort normal. She exhibits no tenderness.  Abdominal: Soft. Bowel sounds are normal. There is tenderness. There is no rebound and no guarding.       TTP to RUQ, questionably + [place]  Neg CVA tenderness  Musculoskeletal: Normal range of motion.  Neurological: She is alert and oriented to person, place, and time.  Skin: Skin is warm and dry. She is not diaphoretic.  Psychiatric: She has a normal mood and affect.    ED Course  Procedures (including critical care time)  Labs Reviewed  GLUCOSE, CAPILLARY - Abnormal; Notable for the following:    Glucose-Capillary 126 (*)    All other components within normal limits  COMPREHENSIVE METABOLIC PANEL - Abnormal; Notable for the following:    Glucose, Bld 141 (*)    ALT 58 (*)    Alkaline Phosphatase 174 (*)    All other components within normal limits  LIPASE, BLOOD - Abnormal; Notable for the following:    Lipase 69 (*)    All other components within normal limits  CBC - Abnormal; Notable for the following:    WBC 11.3 (*)    RBC 3.84 (*)    Hemoglobin 11.5 (*)    HCT 34.0 (*)    RDW 15.7 (*)    Platelets 449 (*)    All other components within normal limits  DIFFERENTIAL - Abnormal; Notable for the following:    Neutro Abs 8.7 (*)    All other components within normal limits  URINALYSIS, ROUTINE W REFLEX MICROSCOPIC   US Abdomen Complete  01/23/2012  *RADIOLOGY REPORT*  Clinical Data:  Right upper quadrant pain.  Diabetes.  History of acute leukemia.  COMPLETE ABDOMINAL ULTRASOUND  Comparison:  01/15/2012 CT.  01/17/2012 limited  right upper quadrant ultrasound.  Findings:  Gallbladder:  Multiple mobile gallstones.  No gallbladder wall thickening or pericholecystic fluid.  The patient was not tender over this region during scanning as per ultrasound technologist.  Common bile duct:  3.0 mm.  Liver:  Increased echogenicity consistent with fatty infiltration without focal mass.  IVC:  Appears normal.  Pancreas:  No focal abnormality seen.  Spleen:  5.6 cm.  No focal mass.  Right Kidney:  11.2 cm. No hydronephrosis or renal mass.  On the prior CT scan, hypodensity upper aspect of the right kidney may represent streak artifact from rib.  If pyelonephritis is of concern, correlation with urinalysis and clinical exam recommended. This was discussed with [facility] Fontana emergency room  physician's assistant.  Left Kidney:  12.5 cm. No hydronephrosis or renal mass.  Abdominal aorta:  No aneurysm identified.  IMPRESSION: Multiple mobile gallstones without findings of cholecystitis. Please see above.  Fatty liver.  Original Report Authenticated By: Fuller Canada, M.D.     1. APL (acute promyelocytic leukemia)   2. UTI (lower urinary tract infection)   3. Cholelithiasis   4. RUQ pain       MDM  Pt with hx leukemia, currently receiving chemo, presents with RUQ abd pain. Was recently hospitalized for the same. Her vitals are stable here; afebrile. Labs remarkable for mild plt elevation to 450, elevated lipase to 69, and elevated alk phos to 174. [place] was performed which shows cholelithiasis without cholecystitis. UA with probable infection (UA appears similar to UA on previous hospitalization, grew out Klebsiella on cx). Discussed with family practice resident at 20 PM who accepts for admission. Pt to be transferred to [facility] for admit.      Grant Fontana, [place] 01/23/12 2307

## 2012-01-24 ENCOUNTER — Encounter (HOSPITAL_COMMUNITY): Payer: Self-pay | Admitting: *Deleted

## 2012-01-24 ENCOUNTER — Inpatient Hospital Stay (HOSPITAL_COMMUNITY): Payer: Medicaid Other

## 2012-01-24 LAB — CBC
HCT: 30.9 % — ABNORMAL LOW (ref 36.0–46.0)
Hemoglobin: 10.5 g/dL — ABNORMAL LOW (ref 12.0–15.0)
RBC: 3.48 MIL/uL — ABNORMAL LOW (ref 3.87–5.11)
WBC: 11.5 10*3/uL — ABNORMAL HIGH (ref 4.0–10.5)

## 2012-01-24 LAB — GLUCOSE, CAPILLARY
Glucose-Capillary: 218 mg/dL — ABNORMAL HIGH (ref 70–99)
Glucose-Capillary: 261 mg/dL — ABNORMAL HIGH (ref 70–99)
Glucose-Capillary: 268 mg/dL — ABNORMAL HIGH (ref 70–99)

## 2012-01-24 LAB — COMPREHENSIVE METABOLIC PANEL
ALT: 53 U/L — ABNORMAL HIGH (ref 0–35)
Alkaline Phosphatase: 136 U/L — ABNORMAL HIGH (ref 39–117)
CO2: 23 mEq/L (ref 19–32)
Chloride: 103 mEq/L (ref 96–112)
GFR calc Af Amer: >90 mL/min (ref >90)
GFR calc non Af Amer: >90 mL/min (ref >90)
Glucose, Bld: 173 mg/dL — ABNORMAL HIGH (ref 70–99)
Potassium: 3.9 mEq/L (ref 3.5–5.1)
Sodium: 138 mEq/L (ref 135–145)
Total Bilirubin: 0.6 mg/dL (ref 0.3–1.2)
Total Protein: 6.6 g/dL (ref 6.0–8.3)

## 2012-01-24 MED ORDER — INSULIN ASPART 100 UNIT/ML ~~LOC~~ SOLN
0.0000 [IU] | Freq: Every day | SUBCUTANEOUS | Status: DC
  Administered 2012-01-24: 3 [IU] via SUBCUTANEOUS

## 2012-01-24 MED ORDER — TECHNETIUM TC 99M MEBROFENIN IV KIT
5.5000 | PACK | Freq: Once | INTRAVENOUS | Status: AC | PRN
  Administered 2012-01-24: 6 via INTRAVENOUS

## 2012-01-24 MED ORDER — HEPARIN SODIUM (PORCINE) 5000 UNIT/ML IJ SOLN
5000.0000 [IU] | Freq: Three times a day (TID) | INTRAMUSCULAR | Status: DC
  Administered 2012-01-24 – 2012-01-25 (×4): 5000 [IU] via SUBCUTANEOUS
  Filled 2012-01-24 (×7): qty 1

## 2012-01-24 MED ORDER — PIPERACILLIN-TAZOBACTAM 3.375 G IVPB
3.3750 g | Freq: Three times a day (TID) | INTRAVENOUS | Status: DC
  Administered 2012-01-24 – 2012-01-25 (×4): 3.375 g via INTRAVENOUS
  Filled 2012-01-24 (×6): qty 50

## 2012-01-24 MED ORDER — TRETINOIN 10 MG PO CAPS: 50.0000 mg | ORAL_CAPSULE | Freq: Two times a day (BID) | ORAL | Status: DC

## 2012-01-24 MED ORDER — INSULIN ASPART 100 UNIT/ML ~~LOC~~ SOLN
0.0000 [IU] | Freq: Three times a day (TID) | SUBCUTANEOUS | Status: DC
  Administered 2012-01-24 – 2012-01-25 (×3): 11 [IU] via SUBCUTANEOUS

## 2012-01-24 MED ORDER — IBUPROFEN 800 MG PO TABS
800.0000 mg | ORAL_TABLET | Freq: Three times a day (TID) | ORAL | Status: DC | PRN
  Filled 2012-01-24 (×2): qty 1

## 2012-01-24 MED ORDER — KETOROLAC TROMETHAMINE 30 MG/ML IJ SOLN
30.0000 mg | Freq: Three times a day (TID) | INTRAMUSCULAR | Status: DC
  Administered 2012-01-24 – 2012-01-25 (×4): 30 mg via INTRAVENOUS
  Filled 2012-01-24 (×8): qty 1

## 2012-01-24 MED ORDER — DEXTROSE-NACL 5-0.45 % IV SOLN
INTRAVENOUS | Status: DC
  Administered 2012-01-24 (×2): via INTRAVENOUS

## 2012-01-24 MED ORDER — TRETINOIN 10 MG PO CAPS
50.0000 mg | ORAL_CAPSULE | Freq: Two times a day (BID) | ORAL | Status: DC
  Administered 2012-01-24 – 2012-01-25 (×2): 50 mg via ORAL
  Filled 2012-01-24 (×4): qty 5

## 2012-01-24 MED ORDER — LISINOPRIL 10 MG PO TABS
10.0000 mg | ORAL_TABLET | Freq: Every day | ORAL | Status: DC
  Administered 2012-01-24 – 2012-01-25 (×2): 10 mg via ORAL
  Filled 2012-01-24 (×2): qty 1

## 2012-01-24 NOTE — H&P (Signed)
[facility] Attending Admission Note: Melissa Levy MD [phone] pager office [phone] I  have seen and examined this patient, reviewed their chart. I have discussed this patient with the resident. I agree with the resident's findings, assessment and care plan. I remember Melissa Washington  From her last admission to our service where she was diagnosed with acute leukemia.  She is comfortable now, some dull RUQ pain. Phone consult with surgery and they recommended HID scan. Will need to get Oncology input to order her chemotherapy (per pharmacy)

## 2012-01-24 NOTE — H&P (Signed)
Family Medicine Teaching Service HISTORY & PHYSICAL   Patient name: Melissa Washington Medical record number: [id number] Date of birth: 05-11-1986 Age: 26 y.o. Gender: female  Primary Care Provider: Edd Arbour, MD, MD   Chief Complaint: RUQ Abdominal Pain    HPI  Melissa Washington is a 26 y.o. year old female presenting with RUQ abdominal pain.  She has recently been discharged from [facility] for similar complaints that were attributed to pleurisy from pneumonia.  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    Her abdominal pain is described as follows: Location  RUQ with radiation to R Flank  Onset  worsened over the past 3-5 days with new onset N&V X 24o  Character  Dull underlying ache with occasional sharp pains  Alleviating  Sitting up  Aggrivating  Lying, twisting; no association with food; no association with inspiration although at last hospitalization was ilicited by deep inspiration.     Her N&V and anorexia are as follows: Onset  within last 24o; has not had any vomiting during entirity of chemotherapy  Character  No bloodly, non bilious; reports having been able to keep her medicines down.   Temporal  typically vomiting occurs 1 hour after attempting to eat  Alleviating  home zofran has helped Nausea but still has vomiting  Aggrivating  eating   She was seen during the last hospitalization by General Surgery who felt although the pt has cholelithiasis her pain is not typical for biliary colic and there were no radiographic signs of acute cholecystis and her HIDA scan was unremarkable.     ROS   Constitutional No new fatigue  Infectious No fevers, no chills  Resp No cough, no congestion  Cardiac No palpitations  GI Per HPI; no diarrhea  GU No dysuria, no hematuria, no vaginal discharge             HISTORY:  PMHx:  Past Medical History  Diagnosis Date  . Diabetes  mellitus   . Hypertension   . Nephrolithiasis   . 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 Hepatic steatosis 01/17/2012  . Cholelithiasis 01/17/2012    PSHx: Past Surgical History  Procedure Date  . Portacath placement     [facility]    Social Hx: History   Social History  . Marital Status: Single    Spouse Name: N/A    Number of Children: 1  . Years of Education: 13   Occupational History  . Unemployed   . Personal care aide in past    Social History Main Topics  . Smoking status: Never Smoker   . Smokeless tobacco: Not on file  . Alcohol Use: No  . Drug Use: No  . Sexually Active: Yes    Birth Control/ Protection: None   Other Topics Concern  . Not on file   Social History Narrative   Single, lives with her daughter in [place].    Substance Hx: History  Substance Use Topics  . Smoking status: Never Smoker   . Smokeless tobacco: Not on file  . Alcohol Use: No    Family Hx: Family History  Problem Relation Age of Onset  . Kidney disease Mother   . Kidney disease Maternal Grandmother   . Diabetes Maternal Grandmother   . Hypertension Mother   . Diabetes Father     Allergies: Allergies  Allergen Reactions  .  Ultram (Tramadol Hcl) Nausea And Vomiting    Home Medications: Prior to Admission medications   Medication Sig Start Date End Date Taking? Authorizing Provider  ciprofloxacin (CIPRO) 500 MG tablet Take 1 tablet (500 mg total) by mouth 2 (two) times daily. 01/21/12 01/31/12 Yes Christina P Rama, MD  ibuprofen (ADVIL) 800 MG tablet Take 1 tablet (800 mg total) by mouth every 8 (eight) hours as needed for pain. 01/21/12 01/31/12 Yes Christina P Rama, MD  lidocaine-prilocaine (EMLA) cream Apply topically as needed. 01/15/12 01/14/13 Yes Pierce Crane, MD  lisinopril (PRINIVIL,ZESTRIL) 10 MG tablet Take 1 tablet (10 mg total) by mouth daily. 01/15/12 01/14/13 Yes Pierce Crane, MD  metFORMIN (GLUCOPHAGE) 1000 MG tablet Take 1 tablet (1,000 mg  total) by mouth daily with breakfast. 01/15/12 01/14/13 Yes Pierce Crane, MD  metroNIDAZOLE (FLAGYL) 500 MG tablet Take 1 tablet (500 mg total) by mouth 3 (three) times daily. 01/21/12 01/31/12 Yes Christina P Rama, MD  oxyCODONE 10 MG TABS Take 1 tablet (10 mg total) by mouth every 4 (four) hours as needed for pain. 01/21/12 01/31/12 Yes Christina P Rama, MD  promethazine (PHENERGAN) 25 MG tablet Take 25 mg by mouth every 6 (six) hours as needed. For nausea   Yes Historical Provider, MD  tretinoin (VESANOID) 10 MG capsule Take 5 capsules (50 mg total) by mouth 2 (two) times daily with a meal. 01/15/12 01/29/12 Yes Pierce Crane, MD              OBJECTIVE  Vitals: Patient Vitals for the past 24 hrs:  BP Temp Temp src Pulse Resp SpO2  01/24/12 0226 154/102 mmHg 98.4 F (36.9 C) Oral 82  19  99 %  01/24/12 0130 125/88 mmHg - - 74  - 99 %  01/24/12 0030 - - - 73  - 97 %  01/24/12 0015 114/51 mmHg - - 77  - 97 %  01/24/12 0000 - - - 75  - 99 %  01/23/12 2345 116/67 mmHg - - 80  - 97 %  01/23/12 2330 156/54 mmHg - - 74  - 97 %  01/23/12 2320 167/106 mmHg 98.2 F (36.8 C) Oral 80  18  100 %  01/23/12 2315 117/66 mmHg - - 69  - 98 %  01/23/12 2300 107/87 mmHg - - 80  - 98 %  01/23/12 1931 117/88 mmHg 98.4 F (36.9 C) Oral 83  17  98 %  01/23/12 1819 149/92 mmHg 98.7 F (37.1 C) Oral 86  16  96 %  01/23/12 1536 124/90 mmHg 98.6 F (37 C) Oral 98  16  99 %   Wt Readings from Last 3 Encounters:  01/17/12 275 lb (124.739 kg)  01/15/12 276 lb 4.8 oz (125.329 kg)  01/11/12 275 lb (124.739 kg)   No intake or output data in the 24 hours ending 01/24/12 0323  PE: GENERAL:  Adult AA female.  Examined in [facility] 5100.  Sitting on Bedside  In no discomfort; norespiratory distress.   PSYCH: Alert and appropriately interactive; Insight:Good   H&N: AT/[place], MMM, no scleral icterus, EOMi THORAX: HEART: RRR, S1/S2 heard, no murmur LUNGS: diminished air flow but ; CTA B, no wheezes, no crackles ABDOMEN:  Hyperactive  BS, Obese, soft, tender in RUQ but neg murphy's signs;  Neg mcburney's; no rigidity, no masses/organomegaly EXTREMITIES: Moves all 4 extremities spontaneously, warm well perfused, no edema, bilateral DP and PT pulses 2/4.    LABS:  Basename 01/23/12 1647 01/19/12 0545 01/18/12 0630 01/17/12  6578 01/13/12 2200 01/11/12 1451 01/11/12 0922 01/04/12 1212 12/27/11 1125 11/26/11 1304  WBC 11.3* 11.1* 13.5* 14.9* 14.4* 6.6 6.4 2.8* 2.9* --  HGB 11.5* 9.7* 10.3* 11.1* 11.6* 11.2* 11.9 11.6 10.6* --  HCT 34.0* 28.2* 29.7* 32.3* 33.3* 32.7* 34.8 33.4* 31.6* --  PLT 449* 195 166 214 211 214 194 424* 502* 33*    Basename 01/23/12 1647 01/19/12 0545 01/18/12 0630 01/17/12 0435 01/15/12 0535 01/13/12 0535 01/12/12 0610 01/11/12 1451 01/11/12 0924 01/04/12 1212 12/27/11 1125  NA 138 134* 136 133* 138 -- 134* 139 140 140 140  K 4.0 3.6 3.7 3.6 4.1 4.1 4.2 3.3* 3.4* 4.3 --  CL 101 100 104 99 100 -- 100 104 103 106 107  CO2 24 24 25 21 25  -- 22 24 24 22 22   BUN 12 12 10 21 21  -- 13 10 9 10 15   CREATININE 0.56 0.63 0.57 0.59 0.58 -- 0.58 0.52 0.63 0.57 0.57  CALCIUM 9.9 8.9 8.5 8.8 9.7 -- 10.5 9.5 9.5 9.7 9.3  GLUCOSE 141* 248* 191* 274* 238* -- 268* 138* 147* 113* 113*    Metabolic:  Basename 01/18/12 1857 01/15/12 0535 01/13/12 0535  MG 2.1 2.1 2.2  PHOS -- -- --    Basename 01/23/12 1647 01/19/12 0545 01/17/12 0435 01/15/12 0535 01/12/12 0610  ALT 58* 29 16 14 24   AST 37 20 19 10 16   ALKPHOS 174* 102 105 110 102  BILITOT 0.6 0.7 0.5 0.5 0.7  BILIDIR -- -- -- -- --    Basename 01/23/12 1647 01/19/12 0545 01/17/12 0435  PROT 8.1 6.3 6.4  ALBUMIN 3.9 2.8* 3.7  PREALBUMIN -- -- --   No results found for this basename: CHOL:3,TRIG:3,HDL:3,LDL:3 in the last 8760 hours  Basename 01/24/12 0037 01/23/12 1536 01/21/12 0744 01/20/12 2128 01/20/12 1757 01/20/12 1259 01/20/12 0743 01/19/12 2108 01/19/12 1646 01/19/12 1205  GLUCAP 131* 126* 175* 215* 272* 263* 123* 256* 279* 187*    Basename  11/25/11 1917 10/09/11 1500  HGBA1C 7.7* 8.6   No results found for this basename: MICROALBUR:3,MALB24HUR:3 in the last 8760 hours No results found for this basename: TSH,T3FREE,FREET4,T3TOTAL:3,T4TOTAL:3,THYROIDAB:3 in the last 8760 hours  Basename 11/26/11 0910  FOLATE 13.1  VITAMINB12 >2000*    Basename 11/26/11 0910  IRON 75  TIBC 258  FERRITIN 1202*    No results found for this basename: LEAD:1 in the last 8760 hours No components found with this basename: VITAMIND:3 No results found for this basename: AMMONIA:3 in the last 8760 hours No results found for this basename: URICACID:3 in the last 8760 hours  Hematology:  [facility] 01/23/12 1647 01/11/12 1451 01/11/12 0922  LYMPHSABS 1.6 1.6 1.4  MONOABS 0.7 0.5 0.6  EOSABS 0.3 0.0 0.0  BASOSABS 0.0 0.0 0.0    Basename 01/23/12 1647 01/19/12 0545 01/18/12 0630 11/26/11 0910  RDW 15.7* 15.6* 15.8* --  MCV 88.5 87.6 87.6 --  MCHC 33.8 34.4 34.7 --  MRET -- -- -- 37.5    Basename 01/13/12 2200 01/04/12 1212 11/26/11 1304 11/26/11 1226  INR 1.12 1.10* 1.52* 1.45  APTT 27 -- 37 35   No results found for this basename: OCCULTBLD:5 in the last 8760 hours  Other Labs: No results found for this basename: CKTOTAL:3,TROPONINI:3,TROPONINT:3,CKMBINDEX:3 in the last 8760 hours  Basename 01/23/12 1647 01/19/12 0545  AMYLASE -- --  LIPASE 69* 57    Basename 01/18/12 1417 11/26/11 1304  DDIMER 1.66* >20.00*    Basename 11/26/11 1304  FIBRINOGEN 276  Basename 01/04/12 1212  LDH 199   No results found for this basename: CRP:3,ESRSEDRATE:3,SEDRATE,POCTSEDRATE in the last 8760 hours No results found for this basename: LATICACIDVEN:3,PROCALCITON:3 in the last 8760 hours  Basename 08/17/11 1951  PHART --  PCO2ART --  PO2ART --  HCO3 --  TCO2 30  ACIDBASEDEF --  O2SAT --    Basename 01/11/12 1451 12/27/11 1105  PREGTESTUR -- NEGATIVE  PREGSERUM NEGATIVE --  HCG -- --  HCGQUANT -- --   No results found for  this basename: SPEP:3,UPEP:3 in the last 8760 hours No results found for this basename: PROTEIN24HR:3 in the last 8760 hours   Basename 11/26/11 0910  HIV1X2 --  HAV --  HEPAIGM NEGATIVE  HEPBIGM NEGATIVE  HEPBCAB --  HBEAG --  HEPCAB --   No results found for this basename: ANA,RF,SMOOTHMUSCAB,MITOAB in the last 8760 hours  Drug Levels: No results found for this basename: LABOPIA:5,COCAINSCRNUR:5,LABBENZ:5,AMPHETMU:5,THCU:5,LABBARB:5,ETH:5 in the last 8760 hours  No results found for this basename: PHENYTOIN:3,PHENOBARB:3,VALPROATE:3,CBMZ:3,LITHIUM:3 in the last 8760 hours No results found for this basename: DIGOXIN:3 in the last 8760 hours  MICRO: Urinalysis  Basename 01/23/12 2038 01/17/12 0352 12/27/11 1105  COLORURINE YELLOW YELLOW YELLOW  APPEARANCEUR CLOUDY* CLOUDY* CLOUDY*  LABSPEC 1.019 1.018 1.017  PHURINE 6.0 6.0 6.0  GLUCOSEU NEGATIVE 500* NEGATIVE  HGBUR NEGATIVE TRACE* LARGE*  BILIRUBINUR NEGATIVE NEGATIVE NEGATIVE  KETONESUR NEGATIVE NEGATIVE NEGATIVE  PROTEINUR 30* NEGATIVE NEGATIVE  UROBILINOGEN 0.2 1.0 0.2  NITRITE NEGATIVE NEGATIVE NEGATIVE  LEUKOCYTESUR SMALL* SMALL* SMALL*   Urine Culture 01/17/2012 - >100,000 Klebsellia   IMAGING: US Abdomen Complete  01/23/2012  *RADIOLOGY REPORT*  Clinical Data:  Right upper quadrant pain.  Diabetes.  History of acute leukemia.  COMPLETE ABDOMINAL ULTRASOUND  Comparison:  01/15/2012 CT.  01/17/2012 limited right upper quadrant ultrasound.  Findings:  Gallbladder:  Multiple mobile gallstones.  No gallbladder wall thickening or pericholecystic fluid.  The patient was not tender over this region during scanning as per ultrasound technologist.  Common bile duct:  3.0 mm.  Liver:  Increased echogenicity consistent with fatty infiltration without focal mass.  IVC:  Appears normal.  Pancreas:  No focal abnormality seen.  Spleen:  5.6 cm.  No focal mass.  Right Kidney:  11.2 cm. No hydronephrosis or renal mass.  On the prior  CT scan, hypodensity upper aspect of the right kidney may represent streak artifact from rib.  If pyelonephritis is of concern, correlation with urinalysis and clinical exam recommended. This was discussed with Grant Fontana emergency room physician's assistant.  Left Kidney:  12.5 cm. No hydronephrosis or renal mass.  Abdominal aorta:  No aneurysm identified.  IMPRESSION: Multiple mobile gallstones without findings of cholecystitis. Please see above.  Fatty liver.  Original Report Authenticated By: Fuller Canada, M.D.   Ct Abdomen Pelvis W Contrast  01/17/2012  *RADIOLOGY REPORT*  Clinical Data: Right-sided abdominal pain, fever.  CT ABDOMEN AND PELVIS WITH CONTRAST  Technique:  Multidetector CT imaging of the abdomen and pelvis was performed following the standard protocol during bolus administration of intravenous contrast.  Contrast: OMNIPAQUE IOHEXOL 300 MG/ML  SOLN  Comparison: 01/17/2012 ultrasound, 11/03/2008 CT  Findings: Limited images through the lung bases demonstrate no significant appreciable abnormality. The heart size is within normal limits. No pleural or pericardial effusion.  Hepatic steatosis.  Cholelithiasis.  No gallbladder wall thickening or pericholecystic fluid.  Unremarkable spleen, pancreas, adrenal glands.  Symmetric renal enhancement.  No hydronephrosis or hydroureter.  No bowel obstruction.  No CT  evidence for colitis.  Normal appendix.  No free intraperitoneal air. Within the pelvis, there is trace free fluid.  No lymphadenopathy.  Thin-walled bladder.  Unremarkable CT appearance to the uterus and adnexa.  No acute osseous abnormality.  IMPRESSION: Cholelithiasis without CT evidence for acute cholecystitis.  HIDA scan is the study of choice if clinical concern for acute cholecystitis persists.  Hepatic steatosis.  Normal appendix.  Original Report Authenticated By: Waneta Martins, M.D.              ASSESSMENT & PLAN  26 y.o. 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    1. RUQ Abdominal Pain (Cholelithiasis; Pleurisy, Nausea/Vomiting) - Pt with known history of cholelithiasis but no sonographic evidence of acute cholecystitis.  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  Previously evaluated by gen surgery *Zofran *Dilaudid *Toradol -Pain likely still from pleuritic process following recent pneumonia -N/V could be from many etiologies including acute viral process vs medication side effect vs gall bladder  -Toradol helped with pleuritic pain at last hospitalization - consider d/c with scheduled NSAID  - Consult gen surgery in AM for re-evaluation although ?? likelihood of gallbladder as cause   2. Acute Promyelocytic Leukemia  - pt underwent 1 month of treatment at [facility] in March 2013.  Currently being following by Dr. Donnie Coffin and on Tretinoin monotherapy at this time.  Recently stopped Arsenic at last hospitalization.   *Tretinoin - Continue to hold Arsenic at this time - Cell lines improved from prior hospitalizations - Slight leukocytosis may be more significant given baseline leukopenia with disease  - Will inform Dr. Bernadette Hoit of pt's admission - ?formal consult  3. GU (UTI - failed OP therapy) - Pt with recent Klebsiella UTI treated with In pt Zosyn + Cipro/Flagyl as OP X 3 days.  Appears to have failed therapy as currently + WBCs and few bacteria on UA. Will obtain culture *Zosyn - Cloudy with small Leukocytes but 11-20 WBCs; few EPIs  < > Urine Culture - consider change in agent as was on Zosyn and Cipro previously and has appeared to fail therapy  4. CV: (HTN) - Pt with known HTN  On home meds *Lisinopril - Elevated on admission but in pain; will treat pain and continue to reasses  -Consider addition of HCTZ or if significantly elevated CCB  5. ENDOCRINE (DM2) - Pt with recent elevation in A1c  to 7.7 on 11/25/2011.   *SSI - While hospitalized will hold metformin - Will  Check fasting lipid panel on 5/6 if still present    --- FEN  *NS 153ml/hr -CHO mod --- PPx: Heparin             DISPOSITION  Will admit pt to med/surg bed.  Control pain and advance diet as pt is requesting;  Consult to gen surgery in the AM.  Cover for UTI and transition to new po regimen prior to d/c   Andrena Mews, DO [facility] Resident - PGY-1 01/24/2012 3:23 AM   R3 addendum    Physical Exam: BP 154/102  Pulse 82  Temp(Src) 98.4 F (36.9 C) (Oral)  Resp 19  Ht 5' 1.81" (1.57 m)  Wt 274 lb 14.6 oz (124.7 kg)  BMI 50.59 kg/m2  SpO2 99%  LMP 12/28/2011            General: alert, cooperative,  no distress and moderately obese HEENT: PERRLA, extra ocular movement intact and sclera clear, anicteric Heart: S1, S2 normal, no murmur, rub or gallop, regular rate and rhythm Lungs: clear to auscultation, no wheezes or rales and unlabored breathing Abdomen: RUQ tenderness, no rebound tenderness, no guarding or rigidity Extremities: extremities normal, atraumatic, no cyanosis or edema Musculoskeletal: no joint tenderness, deformity or swelling Skin:no rashes Neurology: normal without focal findings, mental status, speech normal, alert and oriented x3 and PERLA        Assessment and Plan: Melissa Washington is a 26 y.o. year old female presenting with RUQ pain. 1. RUQ pain- previously evaluated and found to have gallstones but not cholecystitis.  Also recently admitted for CP and diagnosed with pleurisy, much improved with NSAIDs.  This admission continues to have RUQ pain and nausea but no CP.  Will have surgery evaluate now for ? Need for operative intervention.  Will treat pain with toradol and nausea with zofran. 2.   APmL- will continue her outpatient treatment regimen.   3.   UTI-  Assumed to have failed previous 3 day course of cipro.  Will start zosyn, which was sensitive  at last culture 4.   DM- hold metformin, will check CBGs and give SSI 5.   HTN- continue home regimen and monitor closely    Signed: Ellery Plunk, MD Family Medicine Resident PGY-3 01/24/2012 5:42 AM  [phone]

## 2012-01-24 NOTE — Progress Notes (Addendum)
CBG:  At 0800 48   Treatment: 15 GM carbohydrate snack and ate breakfast  Symptoms: Nonenone  Follow-up CBG: Time 0820 CBG Result:155  Possible Reasons for Event: Other: nothing to eat for many hourspatient had not eaten for many hours  Comments/MD notified yes Dr. Berline Chough is aware and is aware of follow up cbg    Melissa Washington

## 2012-01-24 NOTE — Progress Notes (Signed)
  Family Medicine Teaching Service Brief Progress NOTE   Patient name: Melissa Washington Medical record number: [id number] Date of birth: 10-24-1985 Age: 26 y.o. Gender: female Length of Stay:  LOS: 1 day     Pt much improved.  Tolerated sandwich overnight.  Discussed case with Dr. Harlon Flor who requested HIDA scan prior to official consult.  Pt will be NPO for test.  Will start D5 1/2NS.               DISPOSITION  HIDA Scan pending.  Continue Zosyn for presumed UTI  Andrena Mews, DO [facility] Resident - PGY-1 01/24/2012 8:35 AM

## 2012-01-24 NOTE — Progress Notes (Signed)
Pharmacy Chemotherapy Note:   TRETINOIN (VESANOID)  25 yoF with Acute Promyleoctyic Leukemia admitted 05/04 with RUQ abdominal pain.  Recently d/c'd from [facility] for similar complains attributed to pleurisy from PNA.  Regular oncologist is Dr. Donnie Coffin.    No dosage adjustments necessary for renal or hepatic impairment however consider temporarily withholding treatment if LFTs > 5x ULN.   Monitoring includes CBC with diff, coagulation profile, LFTs, cholesterol and TGs, and signs of APL differentiation syndrome (volume status, pulmonary status, temperature, respiration). Medication should be taken with a meal for enhanced absorption.    Assessment:  64 yoF with APL recently treated with arsenic, continues on tretinoin.  Per [facility], tretinoin scheduled days 1-14 of cycle, then off cycle days 14-28.  Tretinoin started 04/22 at [facility], therefore should continue through 05/06.     Plan:  Continue Tretinoin 50mg  BID through 05/06.  F/u with oncology (either Dr. Clelia Croft or Dr. Donnie Coffin) regarding further treatment.    Haynes Hoehn, PharmD 01/24/2012 1:38 PM  Pager: [phone]

## 2012-01-24 NOTE — ED Provider Notes (Signed)
Medical screening examination/treatment/procedure(s) were conducted as a shared visit with non-physician practitioner(s) and myself.  I personally evaluated the patient during the encounter  Toy Baker, MD 01/24/12 1050

## 2012-01-24 NOTE — Progress Notes (Signed)
PHARMACY - ANTIBIOTIC CONSULT  Initial Consult Note  Pharmacy Consult for: Zosyn  Indication: UTI   Patient Data:   Allergies: Allergies  Allergen Reactions  . Ultram (Tramadol Hcl) Nausea And Vomiting    Patient Measurements: Height: 5' 1.81" (157 cm) Weight: 274 lb 14.6 oz (124.7 kg) (Per 01/17/12 documentation) IBW/kg (Calculated) : 49.67    Vital Signs: Temp:  [98.2 F (36.8 C)-98.7 F (37.1 C)] 98.4 F (36.9 C) (05/05 0226) Pulse Rate:  [69-98] 82  (05/05 0226) Resp:  [16-19] 19  (05/05 0226) BP: (107-167)/(51-106) 154/102 mmHg (05/05 0226) SpO2:  [96 %-100 %] 99 % (05/05 0226) Weight:  [274 lb 14.6 oz (124.7 kg)] 274 lb 14.6 oz (124.7 kg) (05/05 0300)  BMI: Estimated Body mass index is 50.59 kg/(m^2) as calculated from the following:   Height as of this encounter: 5' 1.811"(1.57 m).   Weight as of this encounter: 274 lb 14.6 oz(124.7 kg).  Intake/Output from previous day: No intake or output data in the 24 hours ending 01/24/12 0449  Labs:  [facility] 01/23/12 1647  WBC 11.3*  HGB 11.5*  PLT 449*  LABCREA --  CREATININE 0.56   Estimated Creatinine Clearance: 137.6 ml/min (by C-G formula based on Cr of 0.56). No results found for this basename: VANCOTROUGH:2,VANCOPEAK:2,VANCORANDOM:2,GENTTROUGH:2,GENTPEAK:2,GENTRANDOM:2,TOBRATROUGH:2,TOBRAPEAK:2,TOBRARND:2,AMIKACINPEAK:2,AMIKACINTROU:2,AMIKACIN:2, in the last 72 hours   Microbiology: Recent Results (from the past 720 hour(s))  WET PREP, GENITAL     Status: Abnormal   Collection Time   12/27/11  2:27 PM      Component Value Range Status Comment   Yeast Wet Prep HPF POC NONE SEEN  NONE SEEN  Final    Trich, Wet Prep NONE SEEN  NONE SEEN  Final    Clue Cells Wet Prep HPF POC NONE SEEN  NONE SEEN  Final    WBC, Wet Prep HPF POC FEW (*) NONE SEEN  Final   URINE CULTURE     Status: Normal   Collection Time   01/17/12  3:52 AM      Component Value Range Status Comment   Specimen Description URINE, CLEAN CATCH    Final    Special Requests NONE   Final    Culture  Setup Time [id number]   Final    Colony Count >=100,000 COLONIES/ML   Final    Culture KLEBSIELLA PNEUMONIAE   Final    Report Status 01/19/2012 FINAL   Final    Organism ID, Bacteria KLEBSIELLA PNEUMONIAE   Final   CULTURE, BLOOD (ROUTINE X 2)     Status: Normal   Collection Time   01/17/12  4:26 PM      Component Value Range Status Comment   Specimen Description BLOOD LEFT ARM   Final    Special Requests BOTTLES DRAWN AEROBIC ONLY 1CC   Final    Culture  Setup Time [id number]   Final    Culture NO GROWTH 5 DAYS   Final    Report Status 01/23/2012 FINAL   Final   CULTURE, BLOOD (ROUTINE X 2)     Status: Normal   Collection Time   01/17/12  4:50 PM      Component Value Range Status Comment   Specimen Description BLOOD LEFT HAND   Final    Special Requests BOTTLES DRAWN AEROBIC ONLY [facility]   Final    Culture  Setup Time [id number]   Final    Culture NO GROWTH 5 DAYS   Final    Report Status 01/23/2012 FINAL   Final  Medical History: Past Medical History  Diagnosis Date  . Diabetes mellitus   . Hypertension   . Nephrolithiasis   . 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 Hepatic steatosis 01/17/2012  . Cholelithiasis 01/17/2012    Scheduled Medications:     . sodium chloride   Intravenous Once  . insulin aspart  0-20 Units Subcutaneous TID WC  . insulin aspart  0-5 Units Subcutaneous QHS  . ketorolac  30 mg Intravenous Q8H  . lisinopril  10 mg Oral Daily  .  morphine injection  4 mg Intravenous Once  . ondansetron (ZOFRAN) IV  4 mg Intravenous Once  . ondansetron  4 mg Oral Once  . sodium chloride  1,000 mL Intravenous Once  . sodium chloride  1,000 mL Intravenous Once  . tretinoin  50 mg Oral BID WC    Assessment:  26 y.o. female admitted on 01/23/2012, with UTI. Pharmacy consulted to manage Zosyn.   Plan:  1. Zosyn 3.375gm IV Q8H (4 hr infusion). 2. Pharmacy will sign-off as no dose  adjustments anticipated.   Dineen Kid Thad Ranger, PharmD 01/24/2012, 4:49 AM

## 2012-01-25 ENCOUNTER — Other Ambulatory Visit: Payer: Self-pay | Admitting: Lab

## 2012-01-25 ENCOUNTER — Ambulatory Visit: Payer: Self-pay | Admitting: Physician Assistant

## 2012-01-25 ENCOUNTER — Ambulatory Visit: Payer: Self-pay

## 2012-01-25 LAB — GLUCOSE, CAPILLARY: Glucose-Capillary: 257 mg/dL — ABNORMAL HIGH (ref 70–99)

## 2012-01-25 LAB — URINE CULTURE: Colony Count: 25000

## 2012-01-25 MED ORDER — ENSURE COMPLETE PO LIQD: 237.0000 mL | Freq: Three times a day (TID) | ORAL | Status: DC

## 2012-01-25 MED ORDER — HEPARIN SOD (PORK) LOCK FLUSH 100 UNIT/ML IV SOLN
500.0000 [IU] | INTRAVENOUS | Status: AC | PRN
  Administered 2012-01-25: 500 [IU]

## 2012-01-25 MED FILL — Ondansetron HCl Inj 4 MG/2ML (2 MG/ML): INTRAMUSCULAR | Qty: 2 | Status: AC

## 2012-01-25 NOTE — Discharge Summary (Signed)
Family Medicine Teaching Service DISCHARGE SUMMARY   Patient name: Melissa Washington Medical record number: [id number] Date of birth: [birth year] Age: 26 y.o. Gender: female  Attending Physician: Nestor Ramp, MD Primary Care Provider: Edd Arbour, MD, MD Consultants: hematology/oncology  Dates of Hospitalization:  01/23/2012 to 01/25/2012 Length of Stay: 2 days  Admission Diagnoses:  Abdominal Pain, N/V  Discharge Diagnoses:   No resolved problems to display.  Active Hospital Problems  Diagnoses Date Noted   . Pleurisy with effusion 01/21/2012   . Morbid obesity with BMI of 50.0-59.9, adult 01/20/2012   . CAP (community acquired pneumonia) 01/19/2012   . Cholelithiasis 01/17/2012   . Hepatic steatosis 01/17/2012   . 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 UTI (lower urinary tract infection) 10/12/2011   . DIABETES-TYPE 2 12/30/2006   . HYPERTENSION, BENIGN ESSENTIAL 12/30/2006     Resolved Hospital Problems  Diagnoses Date Noted Date Resolved   Patient Active Problem List  Diagnoses Date Noted  . Pleurisy with effusion 01/21/2012  . Morbid obesity with BMI of 50.0-59.9, adult 01/20/2012  . CAP (community acquired pneumonia) 01/19/2012  . Fever 01/17/2012  . Cholelithiasis 01/17/2012  . Hepatic steatosis 01/17/2012  . APL (acute promyelocytic leukemia) 01/15/2012  . 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 Pancytopenia 11/26/2011  . Pyelonephritis 11/25/2011  . UTI (lower urinary tract infection) 10/12/2011  . IUD migration 12/04/2010  . Cyst, breast 12/04/2010  . ABSCESS, SKIN 04/03/2010  . ECZEMA, ATOPIC 05/20/2007  . DIABETES-TYPE 2 12/30/2006  . HYPERTENSION, BENIGN ESSENTIAL 12/30/2006    Brief Hospital Course   Melissa Washington is a 26 y.o. year old female who presented withabdominal pain currently receiving therapy for acute promyelocytic leukemia. Her hospital course was uncomplicated  and she steadily improved throughout her stay.  Her hospital course is as follows:  1. RUQ Abdominal Pain (Cholelithiasis; Pleurisy, Nausea/Vomiting) - Pt with known history of cholelithiasis but no sonographic for HIDA scan evidence of acute cholecystitis. 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 Previously evaluated by gen surgery while hospitalized at [facility] during recent admission HOSPITAL MEDS *Zofran  *Dilaudid  *Toradol  -Pain likely still from pleuritic process following recent pneumonia  -N/V could be from many etiologies including acute viral process vs medication side effect vs gall bladder - HIDA scan negative -Toradol helped with pleuritic pain at last hospitalization - pt to D/c with scheduled Ibuprofen for 3 days then to prn    [ ]  Pt with recent PNA and small effusion.  Suggest non-contrast CT in 6 weeks per CHEST guidelines to assess for resolution  2. Acute Promyelocytic Leukemia  - pt underwent 1 month of treatment at [facility] in March 2013. Currently being following by Dr. Donnie Coffin and on Tretinoin monotherapy at this time. Recently stopped Arsenic at last hospitalization.  HOSPITAL MEDS *Tretinoin  - Continue to hold Arsenic at this time  - Cell lines improved from prior hospitalizations  - Slight leukocytosis may be more significant given baseline leukopenia with disease but likely unreliable. - Dr. Renelda Loma team informed of hospitalization and her Tretinoin was continued    3. GU (UTI - failed OP therapy) - Pt with recent Klebsiella UTI treated with In pt Zosyn + Cipro/Flagyl as OP X 3 days. Concern of failed therapy; repeat culture with multiple morphotypes and 25,000CFUs HOSPITAL MEDS *Zosyn  - Cloudy with small  Leukocytes but 11-20 WBCs; few EPIs  - urine culture was multiple morphotypes reassuring for no UTI.  Will stop ABX therapy at this time as has completed course and asymptomatic  at this time   4. CV: (HTN) - Pt with known HTN On home meds  *Lisinopril  - Elevated on admission but in pain; will treat pain and continue to reasses  - Improved throughout hospitalization will continue home regimen   5. ENDOCRINE (DM2) - Pt with recent elevation in A1c to 7.7 on 11/25/2011.  *SSI  - While hospitalized will hold metformin  - Will Check fasting lipid panel on 5/6 if still present  - Restart home metformin   Significant Diagnostics:  LABS:  Basename 01/24/12 0500 01/23/12 1647 01/19/12 0545 01/18/12 0630 01/17/12 0435 01/13/12 2200 01/11/12 1451 01/11/12 0922 01/04/12 1212 12/27/11 1125  WBC 11.5* 11.3* 11.1* 13.5* 14.9* 14.4* 6.6 6.4 2.8* 2.9*  HGB 10.5* 11.5* 9.7* 10.3* 11.1* 11.6* 11.2* 11.9 11.6 10.6*  HCT 30.9* 34.0* 28.2* 29.7* 32.3* 33.3* 32.7* 34.8 33.4* 31.6*  PLT 372 449* 195 166 214 211 214 194 424* 502*    Basename 01/24/12 0500 01/23/12 1647 01/19/12 0545 01/18/12 0630 01/17/12 0435 01/15/12 0535 01/13/12 0535 01/12/12 0610 01/11/12 1451 01/11/12 0924 01/04/12 1212  NA 138 138 134* 136 133* 138 -- 134* 139 140 140  K 3.9 4.0 3.6 3.7 3.6 4.1 4.1 4.2 3.3* 3.4* --  CL 103 101 100 104 99 100 -- 100 104 103 106  CO2 23 24 24 25 21 25  -- 22 24 24 22   BUN 11 12 12 10 21 21  -- 13 10 9 10   CREATININE 0.57 0.56 0.63 0.57 0.59 0.58 -- 0.58 0.52 0.63 0.57  CALCIUM 9.3 9.9 8.9 8.5 8.8 9.7 -- 10.5 9.5 9.5 9.7  GLUCOSE 173* 141* 248* 191* 274* 238* -- 268* 138* 147* 113*    Metabolic:  Basename 01/18/12 1857 01/15/12 0535 01/13/12 0535  MG 2.1 2.1 2.2  PHOS -- -- --    Basename 01/24/12 0500 01/23/12 1647 01/19/12 0545 01/17/12 0435 01/15/12 0535  ALT 53* 58* 29 16 14   AST 35 37 20 19 10   ALKPHOS 136* 174* 102 105 110  BILITOT 0.6 0.6 0.7 0.5 0.5  BILIDIR -- -- -- -- --    Basename 01/24/12 0500 01/23/12 1647 01/19/12 0545  PROT 6.6 8.1 6.3  ALBUMIN 3.1* 3.9 2.8*  PREALBUMIN -- -- --   No results found for this basename: CHOL:3,TRIG:3,HDL:3,LDL:3 in  the last 8760 hours  Basename 01/25/12 0801 01/24/12 2143 01/24/12 1715 01/24/12 1157 01/24/12 0821 01/24/12 0757 01/24/12 0037 01/23/12 1536 01/21/12 0744 01/20/12 2128  GLUCAP 282* 268* 261* 218* 155* 48* 131* 126* 175* 215*    Basename 01/24/12 0430 11/25/11 1917 10/09/11 1500  HGBA1C 8.1* 7.7* 8.6   No results found for this basename: MICROALBUR:3,MALB24HUR:3 in the last 8760 hours No results found for this basename: TSH,T3FREE,FREET4,T3TOTAL:3,T4TOTAL:3,THYROIDAB:3 in the last 8760 hours  Basename 11/26/11 0910  FOLATE 13.1  VITAMINB12 >2000*    Basename 11/26/11 0910  IRON 75  TIBC 258  FERRITIN 1202*    No results found for this basename: LEAD:1 in the last 8760 hours No components found with this basename: VITAMIND:3 No results found for this basename: AMMONIA:3 in the last 8760 hours No results found for this basename: URICACID:3 in the last 8760 hours  Hematology:  [facility] 01/23/12 1647 01/11/12 1451 01/11/12 0922  LYMPHSABS 1.6 1.6 1.4  MONOABS 0.7 0.5 0.6  EOSABS 0.3 0.0 0.0  BASOSABS 0.0 0.0 0.0    Basename 01/24/12 0500 01/23/12 1647 01/19/12 0545 11/26/11 0910  RDW 15.7* 15.7* 15.6* --  MCV 88.8 88.5 87.6 --  MCHC 34.0 33.8 34.4 --  MRET -- -- -- 37.5    Basename 01/13/12 2200 01/04/12 1212 11/26/11 1304 11/26/11 1226  INR 1.12 1.10* 1.52* 1.45  APTT 27 -- 37 35   No results found for this basename: OCCULTBLD:5 in the last 8760 hours  Other Labs: No results found for this basename: CKTOTAL:3,TROPONINI:3,TROPONINT:3,CKMBINDEX:3 in the last 8760 hours  Basename 01/23/12 1647 01/19/12 0545  AMYLASE -- --  LIPASE 69* 57    Basename 01/18/12 1417 11/26/11 1304  DDIMER 1.66* >20.00*    Basename 11/26/11 1304  FIBRINOGEN 276    Basename 01/04/12 1212  LDH 199   No results found for this basename: CRP:3,ESRSEDRATE:3,SEDRATE,POCTSEDRATE in the last 8760 hours No results found for this basename: LATICACIDVEN:3,PROCALCITON:3 in the last 8760  hours  Basename 08/17/11 1951  PHART --  PCO2ART --  PO2ART --  HCO3 --  TCO2 30  ACIDBASEDEF --  O2SAT --    Basename 01/11/12 1451 12/27/11 1105  PREGTESTUR -- NEGATIVE  PREGSERUM NEGATIVE --  HCG -- --  HCGQUANT -- --   No results found for this basename: TESTOSTERONE in the last 8760 hours  Basename 08/17/11 1951  PTH --  CAION 1.20   No results found for this basename: SPEP:3,UPEP:3 in the last 8760 hours No results found for this basename: PROTEIN24HR:3 in the last 8760 hours   Basename 11/26/11 0910  HIV1X2 --  HAV --  HEPAIGM NEGATIVE  HEPBIGM NEGATIVE  HEPBCAB --  HBEAG --  HEPCAB --   No results found for this basename: ANA,RF,SMOOTHMUSCAB,MITOAB in the last 8760 hours  Drug Levels: No results found for this basename: LABOPIA:5,COCAINSCRNUR:5,LABBENZ:5,AMPHETMU:5,THCU:5,LABBARB:5,ETH:5 in the last 8760 hours  No results found for this basename: PHENYTOIN:3,PHENOBARB:3,VALPROATE:3,CBMZ:3,LITHIUM:3 in the last 8760 hours No results found for this basename: DIGOXIN:3 in the last 8760 hours  Urinalysis  Basename 01/23/12 2038 01/17/12 0352 12/27/11 1105  COLORURINE YELLOW YELLOW YELLOW  APPEARANCEUR CLOUDY* CLOUDY* CLOUDY*  LABSPEC 1.019 1.018 1.017  PHURINE 6.0 6.0 6.0  GLUCOSEU NEGATIVE 500* NEGATIVE  HGBUR NEGATIVE TRACE* LARGE*  BILIRUBINUR NEGATIVE NEGATIVE NEGATIVE  KETONESUR NEGATIVE NEGATIVE NEGATIVE  PROTEINUR 30* NEGATIVE NEGATIVE  UROBILINOGEN 0.2 1.0 0.2  NITRITE NEGATIVE NEGATIVE NEGATIVE  LEUKOCYTESUR SMALL* SMALL* SMALL*    MICRO: Urine Cx 5/4 - Multiple morphotypes present consider recollection; 25,000 CFUs   IMAGING: Dg Chest 2 View  01/19/2012  *RADIOLOGY REPORT*  Clinical Data: Shortness of breath.  CHEST - 2 VIEW  Comparison: 01/17/2012  Findings: Low lung volumes.  Right base atelectasis.  Mild cardiomegaly.  Left lung is clear.  No visible effusions.  No acute bony abnormality.  IMPRESSION: Cardiomegaly.  Low lung volumes  with right base atelectasis.  Original Report Authenticated By: Cyndie Chime, M.D.   Dg Chest 2 View  01/17/2012  *RADIOLOGY REPORT*  Clinical Data: Fever, difficulty breathing.  CHEST - 2 VIEW  Comparison: 07/21/2006  Findings: Hypoaeration results in interstitial and vascular crowding.  Mild streaky opacity at the lung bases.  Right chest wall Port-A-Cath with tip projecting over the proximal SVC.  Heart size upper normal limits.  No pleural effusion or pneumothorax.  No acute osseous abnormality.  IMPRESSION: Mild streaky opacity at the lung bases; atelectasis versus infiltrate.  Hypoaeration results in interstitial and vascular crowding.  Original Report  Authenticated By: Waneta Martins, M.D.   Ct Angio Chest W/cm &/or Wo Cm  01/18/2012  *RADIOLOGY REPORT*  Clinical Data: Post chemotherapy.  Right upper quadrant pain.  CT ANGIOGRAPHY CHEST  Technique:  Multidetector CT imaging of the chest using the standard protocol during bolus administration of intravenous contrast. Multiplanar reconstructed images including MIPs were obtained and reviewed to evaluate the vascular anatomy.  Contrast:  100 ml Omnipaque 300  Comparison: CT  abdomen 01/17/2012  Findings: Exam is suboptimal due to patient body habitus.  No filling defects within the main pulmonary arteries to suggest acute pulmonary embolism.  The proximal lobar  arteries are clear of acute pulmonary embolism.  The segmental pulmonary arteries are poorly evaluated as above.  There are no acute findings aorta great vessels.  No pericardial fluid.  The esophagus is normal.  There is new mild air space disease and effusion at the right lung base.  Small focus of atelectasis at the left lung base.  No pneumothorax or pulmonary edema.  Airways appear normal.  Limited view of the upper abdomen is unremarkable.  No axillary supraclavicular adenopathy.  A port in the right anterior chest wall.  Subcutaneous nodule in the chest wall is likely and subcutaneous  cyst/sebaceous cyst (17 mm (image 17).  IMPRESSION:  1.  No evidence of pulmonary embolism on suboptimal exam.  IF continued concern consider V/Q  scan in future due to patient body habitus. 2.  New bibasilar atelectasis.  Cannot exclude infiltrate at the right lung base.  Original Report Authenticated By: Genevive Bi, M.D.   Nm Hepatobiliary Liver Func  01/24/2012  *RADIOLOGY REPORT*  Clinical Data:  Right upper quadrant pain with nausea and vomiting  NUCLEAR MEDICINE HEPATOBILIARY IMAGING  Technique:  Sequential images of the abdomen were obtained out to 60 minutes following intravenous administration of radiopharmaceutical.  Radiopharmaceutical:  5.62mCi Tc-88m Choletec  Comparison:  January 17, 2012  Findings: There is homogeneous uptake of radiopharmaceutical throughout the liver.  The gallbladder is identified at 10 minutes, as is small bowel.  IMPRESSION: There is no evidence of common duct or cystic duct obstruction.  Original Report Authenticated By: Brandon Melnick, M.D.   Nm Hepatobiliary  01/17/2012  *RADIOLOGY REPORT*  Clinical Data:  Evaluate for cholecystitis  NUCLEAR MEDICINE HEPATOBILIARY IMAGING  Technique:  Sequential images of the abdomen were obtained out to 60 minutes following intravenous administration of radiopharmaceutical.  Radiopharmaceutical:  5.77mCi Tc-57m Choletec  Comparison:  None.  Findings: Following the intravenous administration of the radiopharmaceutical there is uniform uptake by the liver with clearance from the blood pool.  There is normal physiologic accumulation within the gallbladder within 10 minutes.  IMPRESSION:  1.  Patent cystic duct without evidence for acute cholecystitis.  Original Report Authenticated By: Rosealee Albee, M.D.   US Abdomen Complete  01/23/2012  *RADIOLOGY REPORT*  Clinical Data:  Right upper quadrant pain.  Diabetes.  History of acute leukemia.  COMPLETE ABDOMINAL ULTRASOUND  Comparison:  01/15/2012 CT.  01/17/2012 limited right upper  quadrant ultrasound.  Findings:  Gallbladder:  Multiple mobile gallstones.  No gallbladder wall thickening or pericholecystic fluid.  The patient was not tender over this region during scanning as per ultrasound technologist.  Common bile duct:  3.0 mm.  Liver:  Increased echogenicity consistent with fatty infiltration without focal mass.  IVC:  Appears normal.  Pancreas:  No focal abnormality seen.  Spleen:  5.6 cm.  No focal mass.  Right Kidney:  11.2 cm. No  hydronephrosis or renal mass.  On the prior CT scan, hypodensity upper aspect of the right kidney may represent streak artifact from rib.  If pyelonephritis is of concern, correlation with urinalysis and clinical exam recommended. This was discussed with Grant Fontana emergency room physician's assistant.  Left Kidney:  12.5 cm. No hydronephrosis or renal mass.  Abdominal aorta:  No aneurysm identified.  IMPRESSION: Multiple mobile gallstones without findings of cholecystitis. Please see above.  Fatty liver.  Original Report Authenticated By: Fuller Canada, M.D.   Ct Abdomen Pelvis W Contrast  01/17/2012  *RADIOLOGY REPORT*  Clinical Data: Right-sided abdominal pain, fever.  CT ABDOMEN AND PELVIS WITH CONTRAST  Technique:  Multidetector CT imaging of the abdomen and pelvis was performed following the standard protocol during bolus administration of intravenous contrast.  Contrast: OMNIPAQUE IOHEXOL 300 MG/ML  SOLN  Comparison: 01/17/2012 ultrasound, 11/03/2008 CT  Findings: Limited images through the lung bases demonstrate no significant appreciable abnormality. The heart size is within normal limits. No pleural or pericardial effusion.  Hepatic steatosis.  Cholelithiasis.  No gallbladder wall thickening or pericholecystic fluid.  Unremarkable spleen, pancreas, adrenal glands.  Symmetric renal enhancement.  No hydronephrosis or hydroureter.  No bowel obstruction.  No CT evidence for colitis.  Normal appendix.  No free intraperitoneal air.  Within the pelvis, there is trace free fluid.  No lymphadenopathy.  Thin-walled bladder.  Unremarkable CT appearance to the uterus and adnexa.  No acute osseous abnormality.  IMPRESSION: Cholelithiasis without CT evidence for acute cholecystitis.  HIDA scan is the study of choice if clinical concern for acute cholecystitis persists.  Hepatic steatosis.  Normal appendix.  Original Report Authenticated By: Waneta Martins, M.D.   US Abdomen Limited  01/17/2012  *RADIOLOGY REPORT*  Clinical Data:  Right-sided abdominal pain and fever.  LIMITED ABDOMINAL ULTRASOUND - RIGHT UPPER QUADRANT  Comparison:   11/03/2008 CT  Findings:  Gallbladder:  Partially contracted containing multiple echogenic shadowing foci.  No gallbladder wall thickening or pericholecystic fluid.  Common bile duct:  4 mm in diameter, normal appearance.  Liver:  Heterogeneous echogenicity suggests areas of fatty infiltration.  No focal abnormality identified.  IMPRESSION: Cholelithiasis without gallbladder wall thickening or pericholecystic fluid. HIDA scan could be obtained to evalute for acute cholecystitis if clinically warranted.  Hepatic steatosis.                   Original Report Authenticated By: Waneta Martins, M.D.   Ir Fluoro Guide Cv Line Right  01/20/2012  *RADIOLOGY REPORT*  Clinical Data: Leukemia.  FLUOROSCOPIC AND ULTRASOUND GUIDED PLACEMENT OF A SUBCUTANEOUS PORT.  Physician: Rachelle Hora. Henn, MD  Medications:Versed 4 mg, Fentanyl 100 mcg. A radiology nurse monitored the patient for moderate sedation.  As antibiotic prophylaxis, Ancef 2 gm was ordered pre-procedure and administered intravenously within one hour of incision.  Moderate sedation time:60 minutes  Fluoroscopy time: 0.4 minutes  Procedure:  The risks of the procedure were explained to the patient.  Informed consent was obtained.  Patient was placed supine on the interventional table.  Ultrasound confirmed a patent right internal jugular vein.  The right chest and  neck were cleaned with a skin antiseptic and a sterile drape was placed.  Maximal barrier sterile technique was utilized including caps, mask, sterile gowns, sterile gloves, sterile drape, hand hygiene and skin antiseptic. The right neck was anesthetized with 1% lidocaine.  Small incision was made in the right neck with a blade.  Micropuncture set was placed  in the right IJ with ultrasound guidance.  The micropuncture wire was used for measurement purposes.  The right chest was anesthetized with 1% lidocaine with epinephrine.  #15 blade was used to make an incision and a subcutaneous port pocket was formed. 8 french Power Port was assembled.  Subcutaneous tunnel was formed with a stiff tunneling device.  The port catheter was brought through the subcutaneous tunnel.  The port was placed in the subcutaneous pocket.  The micropuncture set was exchanged for a peel-away sheath.  The catheter was placed through the peel-away sheath and the tip was positioned in the lower SVC.  Catheter placement was confirmed with fluoroscopy.  The port was accessed and flushed with heparinized saline.  The port pocket was closed using two layers of absorbable sutures and Dermabond.  The vein skin site was closed using a single layer of absorbable suture and Dermabond.  Sterile dressings were applied.  Patient tolerated the procedure well without an immediate complication.  Ultrasound and fluoroscopic images were taken and saved for this procedure.  Complications: None  Impression:  Placement of a subcutaneous port device.  The catheter tip is in the lower SVC and ready to be used.  Original Report Authenticated By: Richarda Overlie, M.D.   Ir US Guide Vasc Access Right  01/20/2012  *RADIOLOGY REPORT*  Clinical Data: Leukemia.  FLUOROSCOPIC AND ULTRASOUND GUIDED PLACEMENT OF A SUBCUTANEOUS PORT.  Physician: Rachelle Hora. Henn, MD  Medications:Versed 4 mg, Fentanyl 100 mcg. A radiology nurse monitored the patient for moderate sedation.  As  antibiotic prophylaxis, Ancef 2 gm was ordered pre-procedure and administered intravenously within one hour of incision.  Moderate sedation time:60 minutes  Fluoroscopy time: 0.4 minutes  Procedure:  The risks of the procedure were explained to the patient.  Informed consent was obtained.  Patient was placed supine on the interventional table.  Ultrasound confirmed a patent right internal jugular vein.  The right chest and neck were cleaned with a skin antiseptic and a sterile drape was placed.  Maximal barrier sterile technique was utilized including caps, mask, sterile gowns, sterile gloves, sterile drape, hand hygiene and skin antiseptic. The right neck was anesthetized with 1% lidocaine.  Small incision was made in the right neck with a blade.  Micropuncture set was placed in the right IJ with ultrasound guidance.  The micropuncture wire was used for measurement purposes.  The right chest was anesthetized with 1% lidocaine with epinephrine.  #15 blade was used to make an incision and a subcutaneous port pocket was formed. 8 french Power Port was assembled.  Subcutaneous tunnel was formed with a stiff tunneling device.  The port catheter was brought through the subcutaneous tunnel.  The port was placed in the subcutaneous pocket.  The micropuncture set was exchanged for a peel-away sheath.  The catheter was placed through the peel-away sheath and the tip was positioned in the lower SVC.  Catheter placement was confirmed with fluoroscopy.  The port was accessed and flushed with heparinized saline.  The port pocket was closed using two layers of absorbable sutures and Dermabond.  The vein skin site was closed using a single layer of absorbable suture and Dermabond.  Sterile dressings were applied.  Patient tolerated the procedure well without an immediate complication.  Ultrasound and fluoroscopic images were taken and saved for this procedure.  Complications: None  Impression:  Placement of a subcutaneous port  device.  The catheter tip is in the lower SVC and ready to be used.  Original Report Authenticated By: Richarda Overlie, M.D.    Procedures:   IV Fluids & IV Zosyn  Discharge Vitals and Exam:  Vitals: Patient Vitals for the past 24 hrs:  BP Temp Temp src Pulse Resp SpO2  01/25/12 1033 124/62 mmHg 98.7 F (37.1 C) Oral 72  18  100 %  01/25/12 0516 104/67 mmHg 98.2 F (36.8 C) - 76  18  100 %  01/24/12 2136 140/89 mmHg 99.1 F (37.3 C) Oral 73  18  99 %  01/24/12 1729 156/95 mmHg 99.9 F (37.7 C) Oral 81  18  99 %  01/24/12 1407 114/68 mmHg - - 79  - -  01/24/12 1343 151/114 mmHg 99.2 F (37.3 C) Oral 88  17  98 %   Wt Readings from Last 3 Encounters:  01/24/12 274 lb 14.6 oz (124.7 kg)  01/17/12 275 lb (124.739 kg)  01/15/12 276 lb 4.8 oz (125.329 kg)    Intake/Output Summary (Last 24 hours) at 01/25/12 1150 Last data filed at 01/25/12 0500  Gross per 24 hour  Intake    545 ml  Output   2400 ml  Net  -1855 ml    PE: GENERAL:  Young Adult Morbidly Obese AA female.  Examined in [facility] 5100.  Laying in bed  In no discomfort; norespiratory distress.   PSYCH: Alert and appropriately interactive; Insight:Good   H&N: AT/[place], MMM, no scleral icterus, EOMi THORAX: HEART: RRR, S1/S2 heard, no murmur LUNGS: CTA B, no wheezes, no crackles ABDOMEN:  +BS, soft, non-tender, no rigidity, no guarding, no masses/organomegaly; no CVA tenderness; negative murphy's EXTREMITIES: Moves all 4 extremities spontaneously, warm well perfused, no edema, bilateral DP and PT pulses 2/4.   >MSK/Osteopathic: mild tenderness to R posterior and R lateral chest wall reproducing pts pain   Discharge Information   Disposition: 01-Home or Self Care Discharge Diet: Resume diet Discharge Condition:  Improved Discharge Activity: Ad lib  Medication List  As of 01/25/2012 11:50 AM   STOP taking these medications         ciprofloxacin 500 MG tablet      metroNIDAZOLE 500 MG tablet         TAKE these  medications         ibuprofen 800 MG tablet   Commonly known as: ADVIL,MOTRIN   Take 1 tablet (800 mg total) by mouth every 8 (eight) hours as needed for pain.      lidocaine-prilocaine cream   Commonly known as: EMLA   Apply topically as needed.      lisinopril 10 MG tablet   Commonly known as: PRINIVIL,ZESTRIL   Take 1 tablet (10 mg total) by mouth daily.      metFORMIN 1000 MG tablet   Commonly known as: GLUCOPHAGE   Take 1 tablet (1,000 mg total) by mouth daily with breakfast.      Oxycodone HCl 10 MG Tabs   Take 1 tablet (10 mg total) by mouth every 4 (four) hours as needed for pain.      promethazine 25 MG tablet   Commonly known as: PHENERGAN   Take 25 mg by mouth every 6 (six) hours as needed. For nausea      tretinoin 10 MG capsule   Commonly known as: VESANOID   Take 5 capsules (50 mg total) by mouth 2 (two) times daily with a meal.             Follow Up Issues and Recommendations:    Discharge Orders  Future Appointments: Provider: Department: Dept Phone: Center:   02/01/2012 9:45 AM Delcie Roch [facility] [phone] None   02/01/2012 10:15 AM [facility] [phone] None   02/01/2012 10:45 AM Amada Kingfisher, PA [facility] [phone] None   02/01/2012 11:30 AM [facility] F19 [facility] [phone] None   02/02/2012 9:00 AM [facility] E14 [facility] [phone] None   02/02/2012 3:30 PM Edd Arbour, MD [facility] [phone] [facility]   02/03/2012 9:15 AM [facility] I25 Dns [facility] [phone] None   02/04/2012 9:15 AM [facility] D11 [facility] [phone] None   02/05/2012 9:15 AM [facility] A2 [facility] [phone] None     Future Orders Please Complete By Expires   Diet - low sodium heart healthy      Increase activity slowly        Follow-up Information    Follow up with Edd Arbour, MD on 02/02/2012. (@ 3:30)    Contact information:   [address] [place] [address] [phone]         Pending Results: none  Issues and Medications to address: Prior PNA with Effusion - Consider repeat CT of chest in 6 weeks Middle of June to check for resolution Continue chemotheray per Dr Zacarias Pontes, DO [facility] Resident - PGY-1 01/25/2012 11:50 AM

## 2012-01-25 NOTE — Progress Notes (Signed)
INITIAL ADULT NUTRITION ASSESSMENT Date: 01/25/2012   Time: 11:43 AM Reason for Assessment: wt loss  ASSESSMENT: Female 26 y.o.  Dx: abdominal pain  Hx:  Past Medical History  Diagnosis Date  . Diabetes mellitus   . Hypertension   . Nephrolithiasis   . 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 Hepatic steatosis 01/17/2012  . Cholelithiasis 01/17/2012   Past Surgical History  Procedure Date  . Portacath placement     [facility]    Related Meds:  Scheduled Meds:   . feeding supplement  237 mL Oral TID WC  . heparin subcutaneous  5,000 Units Subcutaneous Q8H  . insulin aspart  0-20 Units Subcutaneous TID WC  . insulin aspart  0-5 Units Subcutaneous QHS  . ketorolac  30 mg Intravenous Q8H  . lisinopril  10 mg Oral Daily  . tretinoin  50 mg Oral BID WC  . DISCONTD: piperacillin-tazobactam (ZOSYN)  IV  3.375 g Intravenous Q8H   Continuous Infusions:   . DISCONTD: dextrose 5 % and 0.45% NaCl 100 mL/hr at 01/24/12 1939   PRN Meds:.ibuprofen, technetium TC 22M mebrofenin   Ht: 5' 1.81" (157 cm)  Wt: 274 lb 14.6 oz (124.7 kg) (Per 01/17/12 documentation)  Ideal Wt: 47.7 kg % Ideal Wt: 261%  Usual Wt: approximately 280-285 lbs per pt % Usual Wt: 96%  Body mass index is 50.59 kg/(m^2).  Pt is morbidly obese with recent wt loss.  Food/Nutrition Related Hx: nausea, vomiting, abdominal pain PTA, ongoing chemotherapy  Labs:  CMP     Component Value Date/Time   NA 138 01/24/2012 0500   K 3.9 01/24/2012 0500   CL 103 01/24/2012 0500   CO2 23 01/24/2012 0500   GLUCOSE 173* 01/24/2012 0500   BUN 11 01/24/2012 0500   CREATININE 0.57 01/24/2012 0500   CALCIUM 9.3 01/24/2012 0500   PROT 6.6 01/24/2012 0500   ALBUMIN 3.1* 01/24/2012 0500   AST 35 01/24/2012 0500   ALT 53* 01/24/2012 0500   ALKPHOS 136* 01/24/2012 0500   BILITOT 0.6 01/24/2012 0500   GFRNONAA >90 01/24/2012 0500   GFRAA >90 01/24/2012 0500    CBC    Component Value Date/Time   WBC 11.5* 01/24/2012 0500   WBC 6.4  01/11/2012 0922   RBC 3.48* 01/24/2012 0500   RBC 3.84 01/11/2012 0922   HGB 10.5* 01/24/2012 0500   HGB 11.9 01/11/2012 0922   HCT 30.9* 01/24/2012 0500   HCT 34.8 01/11/2012 0922   PLT 372 01/24/2012 0500   PLT 194 01/11/2012 0922   MCV 88.8 01/24/2012 0500   MCV 90.5 01/11/2012 0922   MCH 30.2 01/24/2012 0500   MCH 30.8 01/11/2012 0922   MCHC 34.0 01/24/2012 0500   MCHC 34.1 01/11/2012 0922   RDW 15.7* 01/24/2012 0500   RDW 16.7* 01/11/2012 0922   LYMPHSABS 1.6 01/23/2012 1647   LYMPHSABS 1.4 01/11/2012 0922   MONOABS 0.7 01/23/2012 1647   MONOABS 0.6 01/11/2012 0922   EOSABS 0.3 01/23/2012 1647   EOSABS 0.0 01/11/2012 0922   BASOSABS 0.0 01/23/2012 1647   BASOSABS 0.0 01/11/2012 0922   Intake: 100% Output:   Intake/Output Summary (Last 24 hours) at 01/25/12 1152 Last data filed at 01/25/12 0500  Gross per 24 hour  Intake    545 ml  Output   2400 ml  Net  -1855 ml   1 stool yesterday  Diet Order: Carb Control  Supplements/Tube Feeding: None at this time  IVF:  DISCONTD: dextrose 5 % and 0.45% NaCl Last Rate: 100 mL/hr at 01/24/12 1939    Estimated Nutritional Needs:   Kcal: 1750-2000 Protein: 60-75g Fluid: >2.0 L/day  Pt states that since cancer diagnosis she has changed her eating habits, attempting for a more healthy lifestyle.  She has increased fruit and vegetable intake and decreased fried foods, choosing grilled instead.  Pt feels she is eating as she normally would, not skipping meals, and drinking fluids.  She has lost 4% UBW in one month which may be related to change in eating habits as well as increased nutrient needs during cancer treatment.  Discussed weight goals during treatment- especially given tendency for nausea, vomiting, abdominal pain- and encouraged continued long-term goals.  Informed pt about the availability of [facility] if needed.  Pt states she typically drinks Ensure when she's admitted, and would like a supplement with lunch and dinner and she reports she will be going  home today.    NUTRITION DIAGNOSIS: -Increased nutrient needs (NI-5.1).  Status: Ongoing  RELATED TO: increased metabolic demand during cancer treatment  AS EVIDENCE BY: pt with weight loss, leukemia  MONITORING/EVALUATION(Goals): 1.  Food/Beverage; pt to consume at least 75% of meals and 1-2 supplements daily. 2.  Wt/wt change; pt to maintain wt during admission.  Instructed pt to pay attention to wt loss greater than 1-2 lbs per week.  EDUCATION NEEDS: -Education needs addressed with pt  INTERVENTION: 1.  Supplement; Ensure Complete TID with meals as requested by pt 2.  General healthful diet; discussed goals, weight, and intake with pt.  Dietitian [phone]  DOCUMENTATION CODES Per approved criteria  -Morbid Obesity    Loyce Dys [facility] 01/25/2012, 11:43 AM

## 2012-01-25 NOTE — Discharge Summary (Signed)
I discussed with Dr Williamson.  I agree with their plans documented in their discharge note for today.  

## 2012-01-25 NOTE — Plan of Care (Signed)
PCP NOTE 10:40 AM [place]   Patient seen and examined. Agree with Inpatient team management.   Filed Vitals:   01/24/12 1729 01/24/12 2136 01/25/12 0516 01/25/12 1033  BP: 156/95 140/89 104/67 124/62  Pulse: 81 73 76 72  Temp: 99.9 F (37.7 C) 99.1 F (37.3 C) 98.2 F (36.8 C) 98.7 F (37.1 C)  TempSrc: Oral Oral  Oral  Resp: 18 18 18 18   Height:      Weight:      SpO2: 99% 99% 100% 100%    PE: see Primary team note  A/P:  Problem list and management per primary team  1. Social visit - discussed care and comfort with patient - patient doing better.   - Will follow with you  Thank you for the excellent care of this patient.  Edd Arbour MD

## 2012-01-25 NOTE — Discharge Instructions (Signed)
You do not have an infection of your gallbladder but you do have gall stones.  This is not a reason to remove your gallbladder unless you continue to have issues; please see the attached reference for more information.   Your pain is likely coming from where you have pneumonia during you last hospitalization.  Please take your Ibuprofen every 8 hours for the next 3 days then as needed.  Please follow up with Dr. Donnie Coffin and Dr. Rivka Safer as indicated

## 2012-01-26 ENCOUNTER — Ambulatory Visit: Payer: Self-pay

## 2012-01-27 ENCOUNTER — Ambulatory Visit: Payer: Self-pay

## 2012-01-27 ENCOUNTER — Encounter: Payer: Self-pay | Admitting: Oncology

## 2012-01-27 NOTE — Progress Notes (Signed)
Patient received one prescription from [facility] on 01/26/12 $3.00,her remaninig balance [facility] $382.00

## 2012-01-28 ENCOUNTER — Ambulatory Visit: Payer: Self-pay

## 2012-01-29 ENCOUNTER — Ambulatory Visit: Payer: Self-pay

## 2012-01-30 ENCOUNTER — Emergency Department (HOSPITAL_COMMUNITY): Payer: Medicaid Other

## 2012-01-30 ENCOUNTER — Inpatient Hospital Stay (HOSPITAL_COMMUNITY)
Admission: EM | Admit: 2012-01-30 | Discharge: 2012-02-04 | DRG: 417 | Disposition: A | Discharge status: Home / Self Care | Payer: Medicaid Other | Attending: Family Medicine | Admitting: Family Medicine

## 2012-01-30 ENCOUNTER — Encounter (HOSPITAL_COMMUNITY): Payer: Self-pay

## 2012-01-30 DIAGNOSIS — E876 Hypokalemia: Secondary | ICD-10-CM | POA: Diagnosis present

## 2012-01-30 DIAGNOSIS — R112 Nausea with vomiting, unspecified: Secondary | ICD-10-CM | POA: Diagnosis present

## 2012-01-30 DIAGNOSIS — R7881 Bacteremia: Secondary | ICD-10-CM | POA: Diagnosis present

## 2012-01-30 DIAGNOSIS — C9590 Leukemia, unspecified not having achieved remission: Secondary | ICD-10-CM

## 2012-01-30 DIAGNOSIS — R109 Unspecified abdominal pain: Secondary | ICD-10-CM

## 2012-01-30 DIAGNOSIS — K8000 Calculus of gallbladder with acute cholecystitis without obstruction: Principal | ICD-10-CM | POA: Diagnosis present

## 2012-01-30 DIAGNOSIS — R509 Fever, unspecified: Secondary | ICD-10-CM

## 2012-01-30 DIAGNOSIS — Z885 Allergy status to narcotic agent status: Secondary | ICD-10-CM

## 2012-01-30 DIAGNOSIS — A4159 Other Gram-negative sepsis: Secondary | ICD-10-CM | POA: Diagnosis not present

## 2012-01-30 DIAGNOSIS — I10 Essential (primary) hypertension: Secondary | ICD-10-CM | POA: Diagnosis present

## 2012-01-30 DIAGNOSIS — N390 Urinary tract infection, site not specified: Secondary | ICD-10-CM

## 2012-01-30 DIAGNOSIS — E1110 Type 2 diabetes mellitus with ketoacidosis without coma: Secondary | ICD-10-CM

## 2012-01-30 DIAGNOSIS — C9240 Acute promyelocytic leukemia, not having achieved remission: Secondary | ICD-10-CM

## 2012-01-30 DIAGNOSIS — R000 Tachycardia, unspecified: Secondary | ICD-10-CM | POA: Diagnosis present

## 2012-01-30 DIAGNOSIS — Z794 Long term (current) use of insulin: Secondary | ICD-10-CM

## 2012-01-30 DIAGNOSIS — Z79899 Other long term (current) drug therapy: Secondary | ICD-10-CM

## 2012-01-30 DIAGNOSIS — E1165 Type 2 diabetes mellitus with hyperglycemia: Secondary | ICD-10-CM | POA: Diagnosis present

## 2012-01-30 DIAGNOSIS — E119 Type 2 diabetes mellitus without complications: Secondary | ICD-10-CM

## 2012-01-30 DIAGNOSIS — A419 Sepsis, unspecified organism: Secondary | ICD-10-CM | POA: Diagnosis present

## 2012-01-30 DIAGNOSIS — E1310 Other specified diabetes mellitus with ketoacidosis without coma: Secondary | ICD-10-CM | POA: Diagnosis present

## 2012-01-30 DIAGNOSIS — E860 Dehydration: Secondary | ICD-10-CM

## 2012-01-30 DIAGNOSIS — E669 Obesity, unspecified: Secondary | ICD-10-CM | POA: Diagnosis present

## 2012-01-30 DIAGNOSIS — C9201 Acute myeloblastic leukemia, in remission: Secondary | ICD-10-CM | POA: Diagnosis present

## 2012-01-30 DIAGNOSIS — A414 Sepsis due to anaerobes: Secondary | ICD-10-CM | POA: Diagnosis not present

## 2012-01-30 LAB — COMPREHENSIVE METABOLIC PANEL
BUN: 11 mg/dL (ref 6–23)
Calcium: 9.6 mg/dL (ref 8.4–10.5)
GFR calc Af Amer: >90 mL/min (ref >90)
Glucose, Bld: 334 mg/dL — ABNORMAL HIGH (ref 70–99)
Total Protein: 7.3 g/dL (ref 6.0–8.3)

## 2012-01-30 LAB — URINALYSIS, ROUTINE W REFLEX MICROSCOPIC
Nitrite: NEGATIVE
Specific Gravity, Urine: 1.02 (ref 1.005–1.030)
pH: 5.5 (ref 5.0–8.0)

## 2012-01-30 LAB — BASIC METABOLIC PANEL
BUN: 8 mg/dL (ref 6–23)
CO2: 20 mEq/L (ref 19–32)
Calcium: 9.5 mg/dL (ref 8.4–10.5)
Chloride: 101 mEq/L (ref 96–112)
Chloride: 104 mEq/L (ref 96–112)
Creatinine, Ser: 0.66 mg/dL (ref 0.50–1.10)
GFR calc Af Amer: >90 mL/min (ref >90)
GFR calc non Af Amer: >90 mL/min (ref >90)
Glucose, Bld: 272 mg/dL — ABNORMAL HIGH (ref 70–99)
Potassium: 3.4 mEq/L — ABNORMAL LOW (ref 3.5–5.1)
Sodium: 136 mEq/L (ref 135–145)
Sodium: 136 mEq/L (ref 135–145)

## 2012-01-30 LAB — DIFFERENTIAL
Basophils Absolute: 0 10*3/uL (ref 0.0–0.1)
Basophils Relative: 0 % (ref 0–1)
Monocytes Relative: 6 % (ref 3–12)
Neutro Abs: 11.8 10*3/uL — ABNORMAL HIGH (ref 1.7–7.7)
Neutrophils Relative %: 82 % — ABNORMAL HIGH (ref 43–77)

## 2012-01-30 LAB — CBC
Hemoglobin: 10.5 g/dL — ABNORMAL LOW (ref 12.0–15.0)
MCHC: 35.2 g/dL (ref 30.0–36.0)

## 2012-01-30 LAB — LACTIC ACID, PLASMA: Lactic Acid, Venous: 3.3 mmol/L — ABNORMAL HIGH (ref 0.5–2.2)

## 2012-01-30 LAB — GLUCOSE, CAPILLARY: Glucose-Capillary: 231 mg/dL — ABNORMAL HIGH (ref 70–99)

## 2012-01-30 LAB — URINE MICROSCOPIC-ADD ON

## 2012-01-30 MED ORDER — DEXTROSE 5 % IV SOLN
1.0000 g | Freq: Once | INTRAVENOUS | Status: AC
  Administered 2012-01-30: 1 g via INTRAVENOUS
  Filled 2012-01-30: qty 10

## 2012-01-30 MED ORDER — ACETAMINOPHEN 325 MG PO TABS
ORAL_TABLET | ORAL | Status: AC
  Filled 2012-01-30: qty 2

## 2012-01-30 MED ORDER — ACETAMINOPHEN 325 MG PO TABS
650.0000 mg | ORAL_TABLET | Freq: Once | ORAL | Status: AC
  Administered 2012-01-30: 650 mg via ORAL
  Filled 2012-01-30: qty 2

## 2012-01-30 MED ORDER — ONDANSETRON HCL 4 MG/2ML IJ SOLN
4.0000 mg | Freq: Once | INTRAMUSCULAR | Status: AC
  Administered 2012-01-30: 4 mg via INTRAVENOUS
  Filled 2012-01-30: qty 2

## 2012-01-30 MED ORDER — SODIUM CHLORIDE 0.9 % IV BOLUS (SEPSIS)
1000.0000 mL | Freq: Once | INTRAVENOUS | Status: AC
  Administered 2012-01-30: 1000 mL via INTRAVENOUS

## 2012-01-30 MED ORDER — SODIUM CHLORIDE 0.9 % IV BOLUS (SEPSIS)
2000.0000 mL | Freq: Once | INTRAVENOUS | Status: AC
  Administered 2012-01-30: 1000 mL via INTRAVENOUS

## 2012-01-30 MED ORDER — INSULIN ASPART 100 UNIT/ML ~~LOC~~ SOLN
10.0000 [IU] | Freq: Once | SUBCUTANEOUS | Status: AC
  Administered 2012-01-30: 10 [IU] via SUBCUTANEOUS
  Filled 2012-01-30: qty 1

## 2012-01-30 MED ORDER — SODIUM CHLORIDE 0.9 % IV SOLN
INTRAVENOUS | Status: DC
  Administered 2012-01-30 (×2): via INTRAVENOUS

## 2012-01-30 NOTE — ED Provider Notes (Signed)
History   This chart was scribed for Melissa Horn, MD scribed by Magnus Sinning. The patient was seen in room STRE2/STRE2 13:17.   CSN: [id number]  Arrival date & time 01/30/12  1233   None     Chief Complaint  Patient presents with  . Fever    (Consider location/radiation/quality/duration/timing/severity/associated sxs/prior treatment) HPI Melissa [place] is a 26 y.o. female who presents to the Emergency Department complaining of fever with a temperature of 101 with associated tachycardia, myalgias and slight dry mouth onset this morning. Patient actively undergoing chemotherapy treatment for leukemia with the last chemo treatment being a few weeks ago. Pt also has hx of cholelithiasis and was admitted for pneumonia in April. Also has hx of DM, and HTN. Patient was recently admitted and d/c within the last week  for cholelithiasis and UTI when she present with n/v. Patient has discontinued abx since d/c. Denies confusion, rash, hallucinations,  urinary sxs, or CP.  Oncologist: Dr. Loreli Dollar  Past Medical History  Diagnosis Date  . Diabetes mellitus   . Hypertension   . Nephrolithiasis   . 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 Hepatic steatosis 01/17/2012  . Cholelithiasis 01/17/2012    Past Surgical History  Procedure Date  . Portacath placement     [facility]    Family History  Problem Relation Age of Onset  . Kidney disease Mother   . Kidney disease Maternal Grandmother   . Diabetes Maternal Grandmother   . Hypertension Mother   . Diabetes Father     History  Substance Use Topics  . Smoking status: Never Smoker   . Smokeless tobacco: Not on file  . Alcohol Use: No   Review of Systems  Constitutional: Positive for fever.       10 Systems reviewed and are negative for acute change except as noted in the HPI.  HENT: Negative for congestion.   Eyes: Negative for discharge and redness.  Respiratory: Negative for cough and shortness of breath.     Cardiovascular: Negative for chest pain.  Gastrointestinal: Negative for vomiting and abdominal pain.  Musculoskeletal: Positive for myalgias. Negative for back pain.  Skin: Negative for rash.  Neurological: Negative for syncope, numbness and headaches.  Psychiatric/Behavioral:       No behavior change.  All other systems reviewed and are negative.    Allergies  Ultram  Home Medications   Current Outpatient Rx  Name Route Sig Dispense Refill  . IBUPROFEN 800 MG PO TABS Oral Take 1 tablet (800 mg total) by mouth every 8 (eight) hours as needed for pain. 30 tablet 0  . LISINOPRIL 10 MG PO TABS Oral Take 1 tablet (10 mg total) by mouth daily. 30 tablet 4  . MAGNESIUM CHLORIDE 64 MG PO TBEC Oral Take 2 tablets by mouth daily.    Marland Kitchen METFORMIN HCL 1000 MG PO TABS Oral Take 1 tablet (1,000 mg total) by mouth daily with breakfast. 90 tablet 4  . PROMETHAZINE HCL 12.5 MG PO TABS Oral Take 12.5 mg by mouth every 8 (eight) hours as needed. For nausea    . TRETINOIN 10 MG PO CAPS Oral Take 50 mg by mouth 2 (two) times daily.      BP 118/86  Pulse 135  Temp(Src) 100.8 F (38.2 C) (Oral)  Resp 22  SpO2 96%  LMP 12/29/2011  Physical Exam  Nursing note and vitals reviewed. Constitutional:       Awake, alert, nontoxic  appearance.  HENT:  Head: Atraumatic.  Eyes: Right eye exhibits no discharge. Left eye exhibits no discharge.  Neck: Neck supple.  Cardiovascular: Regular rhythm.  Tachycardia present.   No murmur heard. Pulmonary/Chest: Effort normal. She has no wheezes. She has no rales. She exhibits no tenderness.  Abdominal: Soft. There is no tenderness. There is no rebound.       No CVAT   Musculoskeletal: She exhibits no tenderness.       Baseline ROM, no obvious new focal weakness. Minimal diffuse tenderness to back and extremities.   Neurological:       Mental status and motor strength appears baseline for patient and situation.  Skin: No rash noted.  Psychiatric: She has a  normal mood and affect.    ED Course  Procedures (including critical care time) DIAGNOSTIC STUDIES: Oxygen Saturation is 96% on room air, normal by my interpretation.    COORDINATION OF CARE: 17:15: EDMD spoke with family practice who recommend not to admit yet. Further recommend to give more fluids and insulin in ED, and perform rechecks til suitable for d/c.   Pt developed recurrent fever and also epigastric pain (minimally tender) with a few emesis in ED, pulse had improved but now tachycardic again to 130, unable to tolerate po fluids, fpc paged again and will see Pt in ED for admit.2140  Labs Reviewed  COMPREHENSIVE METABOLIC PANEL - Abnormal; Notable for the following:    Sodium 133 (*)    CO2 17 (*)    Glucose, Bld 334 (*)    All other components within normal limits  URINALYSIS, ROUTINE W REFLEX MICROSCOPIC - Abnormal; Notable for the following:    Glucose, UA >1000 (*)    Leukocytes, UA TRACE (*)    All other components within normal limits  LACTIC ACID, PLASMA - Abnormal; Notable for the following:    Lactic Acid, Venous 3.3 (*)    All other components within normal limits  CBC - Abnormal; Notable for the following:    WBC 14.4 (*)    RBC 3.47 (*)    Hemoglobin 10.5 (*)    HCT 29.8 (*)    All other components within normal limits  DIFFERENTIAL - Abnormal; Notable for the following:    Neutrophils Relative 82 (*)    Neutro Abs 11.8 (*)    All other components within normal limits  GLUCOSE, CAPILLARY - Abnormal; Notable for the following:    Glucose-Capillary 285 (*)    All other components within normal limits  BASIC METABOLIC PANEL - Abnormal; Notable for the following:    Glucose, Bld 272 (*)    All other components within normal limits  GLUCOSE, CAPILLARY - Abnormal; Notable for the following:    Glucose-Capillary 231 (*)    All other components within normal limits  BASIC METABOLIC PANEL - Abnormal; Notable for the following:    Potassium 3.4 (*)    CO2 18  (*)    Glucose, Bld 206 (*)    All other components within normal limits  POCT PREGNANCY, URINE  URINE MICROSCOPIC-ADD ON  CULTURE, BLOOD (ROUTINE X 2)  CULTURE, BLOOD (ROUTINE X 2)  URINE CULTURE   Dg Chest 2 View  01/30/2012  *RADIOLOGY REPORT*  Clinical Data: Fever  CHEST - 2 VIEW  Comparison: 01/19/2012  Findings: Cardiomegaly again noted.  Right IJ Port-A-Cath is unchanged in position.  Bony thorax is stable.  No acute infiltrate or pulmonary edema.  Stable mild elevation of the right hemidiaphragm with  right basilar atelectasis.  IMPRESSION: No active disease.  No significant change.  Original Report Authenticated By: Natasha Mead, M.D.     1. Fever   2. Urinary tract infection   3. Abdominal pain   4. DKA (diabetic ketoacidoses)   5. Dehydration   6. Cholelithiasis   7. Leukemia   8. DIABETES-TYPE 2       MDM  I personally performed the services described in this documentation, which was scribed in my presence. The recorded information has been reviewed and considered. Pt stable in ED with no significant deterioration in condition.Patient / Family / Caregiver informed of clinical course, understand medical decision-making process, and agree with plan.  The patient appears reasonably stabilized for admission considering the current resources, flow, and capabilities available in the ED at this time, and I doubt any other [facility] requiring further screening and/or treatment in the ED prior to admission.  Melissa Horn, MD 01/31/12 [phone]

## 2012-01-30 NOTE — ED Notes (Signed)
Iv team at beside to access port- a-cath

## 2012-01-30 NOTE — ED Notes (Signed)
Paged iv team for port access 

## 2012-01-30 NOTE — ED Notes (Signed)
CBG 285. 

## 2012-01-30 NOTE — ED Notes (Signed)
Pt reports fever of 101 this am, achy, lightheaded, diaphoretic, tachycardia, clammy.  Pt states keep getting fevers, has leukemia

## 2012-01-30 NOTE — H&P (Signed)
Melissa Washington is an 26 y.o. female.   Chief Complaint: Fever, N/V, abdominal pain HPI: 26 yo F with frequent admissions who came into the ED today for fever and myalgias, and was subsequently found to be in DKA with gap of 23 and CBG 334.  While in the ED, fever resolved and patient wanted to leave.  While treating gap acidosis with insulin and IVF, pt began to have nausea, vomiting, and abdominal pain.  Patient reports fever and myalgias starting this AM as well as 1 episode of diarrhea.  Also endorses some rhinorrhea, cough, and congestion starting this AM. N/V starting this afternoon while in the ED. No chest pain or SOB.  Abdominal pain is similar to previous episodes, is located in RUQ, and is sharp/stabbing in nature-- this started suddenly this afternoon.  Made slightly worse with deep breaths, nothing has made it better yet today.  States that the pain, N/V are bad enough that she would have come in for evaluation in the ED even if she had not already been here.  While in the ED, patient received novolog 10U, tylenol x2 doses, zofran x1, and a total of 3L NS.   Past Medical History  Diagnosis Date  . Diabetes mellitus   . Hypertension   . Nephrolithiasis   . 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 Hepatic steatosis 01/17/2012  . Cholelithiasis 01/17/2012    Past Surgical History  Procedure Date  . Portacath placement     [facility]    Family History  Problem Relation Age of Onset  . Kidney disease Mother   . Kidney disease Maternal Grandmother   . Diabetes Maternal Grandmother   . Hypertension Mother   . Diabetes Father    Social History:  reports that she has never smoked. She does not have any smokeless tobacco history on file. She reports that she does not drink alcohol or use illicit drugs.  Allergies:  Allergies  Allergen Reactions  . Ultram (Tramadol Hcl) Nausea And Vomiting     (Not in a hospital admission)  Results for orders placed during the  hospital encounter of 01/30/12 (from the past 48 hour(s))  URINALYSIS, ROUTINE W REFLEX MICROSCOPIC     Status: Abnormal   Collection Time   01/30/12  1:46 PM      Component Value Range Comment   Color, Urine YELLOW  YELLOW     APPearance CLEAR  CLEAR     Specific Gravity, Urine 1.020  1.005 - 1.030     pH 5.5  5.0 - 8.0     Glucose, UA >1000 (*) NEGATIVE (mg/dL)    Hgb urine dipstick NEGATIVE  NEGATIVE     Bilirubin Urine NEGATIVE  NEGATIVE     Ketones, ur NEGATIVE  NEGATIVE (mg/dL)    Protein, ur NEGATIVE  NEGATIVE (mg/dL)    Urobilinogen, UA 0.2  0.0 - 1.0 (mg/dL)    Nitrite NEGATIVE  NEGATIVE     Leukocytes, UA TRACE (*) NEGATIVE    URINE MICROSCOPIC-ADD ON     Status: Normal   Collection Time   01/30/12  1:46 PM      Component Value Range Comment   Squamous Epithelial / LPF RARE  RARE     WBC, UA 7-10  <3 (WBC/hpf)    Bacteria, UA RARE  RARE    POCT PREGNANCY, URINE     Status: Normal   Collection Time   01/30/12  1:49 PM  Component Value Range Comment   Preg Test, Ur NEGATIVE  NEGATIVE    COMPREHENSIVE METABOLIC PANEL     Status: Abnormal   Collection Time   01/30/12  2:59 PM      Component Value Range Comment   Sodium 133 (*) 135 - 145 (mEq/L)    Potassium 3.8  3.5 - 5.1 (mEq/L)    Chloride 97  96 - 112 (mEq/L)    CO2 17 (*) 19 - 32 (mEq/L)    Glucose, Bld 334 (*) 70 - 99 (mg/dL)    BUN 11  6 - 23 (mg/dL)    Creatinine, Ser 4.54  0.50 - 1.10 (mg/dL)    Calcium 9.6  8.4 - 10.5 (mg/dL)    Total Protein 7.3  6.0 - 8.3 (g/dL)    Albumin 3.7  3.5 - 5.2 (g/dL)    AST 13  0 - 37 (U/L)    ALT 23  0 - 35 (U/L)    Alkaline Phosphatase 106  39 - 117 (U/L)    Total Bilirubin 0.5  0.3 - 1.2 (mg/dL)    GFR calc non Af Amer >90  >90 (mL/min)    GFR calc Af Amer >90  >90 (mL/min)   LACTIC ACID, PLASMA     Status: Abnormal   Collection Time   01/30/12  3:00 PM      Component Value Range Comment   Lactic Acid, Venous 3.3 (*) 0.5 - 2.2 (mmol/L)   CBC     Status: Abnormal    Collection Time   01/30/12  3:05 PM      Component Value Range Comment   WBC 14.4 (*) 4.0 - 10.5 (K/uL)    RBC 3.47 (*) 3.87 - 5.11 (MIL/uL)    Hemoglobin 10.5 (*) 12.0 - 15.0 (g/dL)    HCT 09.8 (*) 11.9 - 46.0 (%)    MCV 85.9  78.0 - 100.0 (fL)    MCH 30.3  26.0 - 34.0 (pg)    MCHC 35.2  30.0 - 36.0 (g/dL)    RDW 14.7  82.9 - 56.2 (%)    Platelets 392  150 - 400 (K/uL)   DIFFERENTIAL     Status: Abnormal   Collection Time   01/30/12  3:05 PM      Component Value Range Comment   Neutrophils Relative 82 (*) 43 - 77 (%)    Neutro Abs 11.8 (*) 1.7 - 7.7 (K/uL)    Lymphocytes Relative 12  12 - 46 (%)    Lymphs Abs 1.7  0.7 - 4.0 (K/uL)    Monocytes Relative 6  3 - 12 (%)    Monocytes Absolute 0.8  0.1 - 1.0 (K/uL)    Eosinophils Relative 1  0 - 5 (%)    Eosinophils Absolute 0.1  0.0 - 0.7 (K/uL)    Basophils Relative 0  0 - 1 (%)    Basophils Absolute 0.0  0.0 - 0.1 (K/uL)   GLUCOSE, CAPILLARY     Status: Abnormal   Collection Time   01/30/12  5:10 PM      Component Value Range Comment   Glucose-Capillary 285 (*) 70 - 99 (mg/dL)    Comment 1 Documented in Chart      Comment 2 Notify RN     GLUCOSE, CAPILLARY     Status: Abnormal   Collection Time   01/30/12  6:29 PM      Component Value Range Comment   Glucose-Capillary 231 (*) 70 - 99 (  mg/dL)    Comment 1 Notify RN      Comment 2 Documented in Chart     BASIC METABOLIC PANEL     Status: Abnormal   Collection Time   01/30/12  6:42 PM      Component Value Range Comment   Sodium 136  135 - 145 (mEq/L)    Potassium 3.9  3.5 - 5.1 (mEq/L)    Chloride 101  96 - 112 (mEq/L)    CO2 20  19 - 32 (mEq/L)    Glucose, Bld 272 (*) 70 - 99 (mg/dL)    BUN 10  6 - 23 (mg/dL)    Creatinine, Ser 9.60  0.50 - 1.10 (mg/dL)    Calcium 9.5  8.4 - 10.5 (mg/dL)    GFR calc non Af Amer >90  >90 (mL/min)    GFR calc Af Amer >90  >90 (mL/min)   BASIC METABOLIC PANEL     Status: Abnormal   Collection Time   01/30/12  8:19 PM      Component Value  Range Comment   Sodium 136  135 - 145 (mEq/L)    Potassium 3.4 (*) 3.5 - 5.1 (mEq/L)    Chloride 104  96 - 112 (mEq/L)    CO2 18 (*) 19 - 32 (mEq/L)    Glucose, Bld 206 (*) 70 - 99 (mg/dL)    BUN 8  6 - 23 (mg/dL)    Creatinine, Ser 4.54  0.50 - 1.10 (mg/dL)    Calcium 9.1  8.4 - 10.5 (mg/dL)    GFR calc non Af Amer >90  >90 (mL/min)    GFR calc Af Amer >90  >90 (mL/min)    Dg Chest 2 View  01/30/2012  *RADIOLOGY REPORT*  Clinical Data: Fever  CHEST - 2 VIEW  Comparison: 01/19/2012  Findings: Cardiomegaly again noted.  Right IJ Port-A-Cath is unchanged in position.  Bony thorax is stable.  No acute infiltrate or pulmonary edema.  Stable mild elevation of the right hemidiaphragm with right basilar atelectasis.  IMPRESSION: No active disease.  No significant change.  Original Report Authenticated By: Natasha Mead, M.D.    Review of Systems  Constitutional: Positive for fever, chills and malaise/fatigue.  HENT: Positive for congestion and sore throat. Negative for hearing loss and ear pain.   Eyes: Negative for blurred vision and double vision.  Respiratory: Negative for cough, hemoptysis, sputum production, shortness of breath and wheezing.   Cardiovascular: Negative for chest pain, palpitations and leg swelling.  Gastrointestinal: Positive for nausea, vomiting, abdominal pain and diarrhea. Negative for heartburn, constipation, blood in stool and melena.  Genitourinary: Negative for dysuria, frequency and flank pain.  Musculoskeletal: Positive for myalgias.  Skin: Negative for rash.  Neurological: Negative for dizziness, focal weakness, weakness and headaches.  Endo/Heme/Allergies: Does not bruise/bleed easily.    Blood pressure 137/82, pulse 113, temperature 100.4 F (38 C), temperature source Oral, resp. rate 18, last menstrual period 12/29/2011, SpO2 99.00%. Physical Exam  Constitutional: She is oriented to person, place, and time. She appears well-developed and well-nourished. No  distress.  HENT:  Head: Normocephalic and atraumatic.  Right Ear: External ear normal.  Left Ear: External ear normal.  Nose: Nose normal.  Mouth/Throat: Oropharynx is clear and moist. No oropharyngeal exudate.  Eyes: Conjunctivae and EOM are normal. Pupils are equal, round, and reactive to light.  Neck: Normal range of motion. Neck supple. No thyromegaly present.  Cardiovascular: Intact distal pulses.  Tachycardia present.   No murmur  heard. Respiratory: Effort normal and breath sounds normal. No respiratory distress. She has no wheezes. She has no rales.  GI: Soft. Normal appearance and bowel sounds are normal. She exhibits no distension and no mass. There is tenderness in the right upper quadrant. There is no rebound and no CVA tenderness.  Musculoskeletal: Normal range of motion. She exhibits no edema.  Lymphadenopathy:    She has no cervical adenopathy.  Neurological: She is alert and oriented to person, place, and time.  Skin: Skin is warm and dry. No rash noted. No erythema.     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 Fever: up to 103 in ED -elevated WBC to 14.4 -CXR today showing no active disease or significant change; no edema or acute infiltrate -UA with rare bacteria, trace leukocytes, >1000 glucose, rare epis -blood and urine cultures sent from ED -Tylenol (and toradol) PRN fever -given broad nature of symptoms, this is most likely due to a viral process; patient is not neutropenic, however, will start Unasyn to cover for possible abdominal etiology (pt is a diabetic with known gallstones) and get abd U/S in AM    2. Abdominal pain, nausea, vomiting: has had multiple admissions for this over the past month -residual pleuritic pain from PNA vs cholelithiasis vs viral process  -Toradol helped with pain on previous admissions, will try this to start (Cr normal)  -LFTs normal, normal abdominal CT <2 weeks ago, normal abd U/S  and HIDA scan <1 week ago -Unasyn and abd U/S to cover for intraabdominal pathology/infection    3. Gap acidosis: likely due to DKA; lactic acid 3.3 -initial gap (with corrected sodium) was 23; now improved to 14 s/p IVF and insulin -continue insulin until CBGs under control -- q4hr checks with SSI -recheck BMET at 0200 and again in AM to monitor gap  -replete K (3.4 currently)   4. HTN: currently normotensive -continue home regimen of lisinopril 10mg    5. Tachycardia: likely multifactorial- pain in addition to fever -monitor on telemetry -IV hyration, control pain, reduce fever -consider IV BB if remains >130-140 despite control of other factors    6. 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 DM: elevated CBGs currently -Will treat with novolog/SSI at this time -restart home metformin once tolerating po (Cr is WNL at 0.55)  FEN/GI: D5 1/2 NS + K @ 100cc/hr; heart healthy diet once tolerating po   Ppx: lovenox  Dispo: pending clinical improvement   Fallou Hulbert 01/30/2012, 9:44 PM

## 2012-01-30 NOTE — ED Notes (Signed)
Admitting MD at bedside.

## 2012-01-31 ENCOUNTER — Inpatient Hospital Stay (HOSPITAL_COMMUNITY): Payer: Medicaid Other

## 2012-01-31 ENCOUNTER — Inpatient Hospital Stay (HOSPITAL_COMMUNITY): Payer: Medicaid Other | Admitting: Anesthesiology

## 2012-01-31 ENCOUNTER — Encounter (HOSPITAL_COMMUNITY): Payer: Self-pay | Admitting: *Deleted

## 2012-01-31 ENCOUNTER — Encounter (HOSPITAL_COMMUNITY)
Admission: EM | Disposition: A | Discharge status: Home / Self Care | Payer: Self-pay | Source: Home / Self Care | Attending: Family Medicine

## 2012-01-31 ENCOUNTER — Encounter (HOSPITAL_COMMUNITY): Payer: Self-pay | Admitting: Anesthesiology

## 2012-01-31 DIAGNOSIS — R6510 Systemic inflammatory response syndrome (SIRS) of non-infectious origin without acute organ dysfunction: Secondary | ICD-10-CM

## 2012-01-31 DIAGNOSIS — R6520 Severe sepsis without septic shock: Secondary | ICD-10-CM

## 2012-01-31 DIAGNOSIS — K8010 Calculus of gallbladder with chronic cholecystitis without obstruction: Secondary | ICD-10-CM

## 2012-01-31 DIAGNOSIS — E1110 Type 2 diabetes mellitus with ketoacidosis without coma: Secondary | ICD-10-CM

## 2012-01-31 DIAGNOSIS — A4159 Other Gram-negative sepsis: Secondary | ICD-10-CM

## 2012-01-31 DIAGNOSIS — E119 Type 2 diabetes mellitus without complications: Secondary | ICD-10-CM

## 2012-01-31 DIAGNOSIS — Z9889 Other specified postprocedural states: Secondary | ICD-10-CM

## 2012-01-31 DIAGNOSIS — K824 Cholesterolosis of gallbladder: Secondary | ICD-10-CM

## 2012-01-31 HISTORY — PX: CHOLECYSTECTOMY: SHX55

## 2012-01-31 LAB — URINE CULTURE: Culture  Setup Time: 201305111806

## 2012-01-31 LAB — CBC
HCT: 28.9 % — ABNORMAL LOW (ref 36.0–46.0)
MCH: 30.1 pg (ref 26.0–34.0)
MCHC: 35.3 g/dL (ref 30.0–36.0)
MCV: 85.3 fL (ref 78.0–100.0)
Platelets: 352 10*3/uL (ref 150–400)
RDW: 15.1 % (ref 11.5–15.5)

## 2012-01-31 LAB — BASIC METABOLIC PANEL
BUN: 5 mg/dL — ABNORMAL LOW (ref 6–23)
CO2: 18 mEq/L — ABNORMAL LOW (ref 19–32)
Calcium: 9.3 mg/dL (ref 8.4–10.5)
Chloride: 101 mEq/L (ref 96–112)
Creatinine, Ser: 0.57 mg/dL (ref 0.50–1.10)
Glucose, Bld: 225 mg/dL — ABNORMAL HIGH (ref 70–99)

## 2012-01-31 LAB — PROTIME-INR
INR: 1.43 (ref 0.00–1.49)
Prothrombin Time: 17.7 s — ABNORMAL HIGH (ref 11.6–15.2)

## 2012-01-31 LAB — APTT: aPTT: 35 seconds (ref 24–37)

## 2012-01-31 LAB — LIPASE, BLOOD: Lipase: 53 U/L (ref 11–59)

## 2012-01-31 LAB — GLUCOSE, CAPILLARY
Glucose-Capillary: 270 mg/dL — ABNORMAL HIGH (ref 70–99)
Glucose-Capillary: 270 mg/dL — ABNORMAL HIGH (ref 70–99)

## 2012-01-31 LAB — MRSA PCR SCREENING: MRSA by PCR: NEGATIVE

## 2012-01-31 SURGERY — LAPAROSCOPIC CHOLECYSTECTOMY WITH INTRAOPERATIVE CHOLANGIOGRAM
Anesthesia: General | Site: Abdomen | Wound class: Contaminated

## 2012-01-31 MED ORDER — ROCURONIUM BROMIDE 100 MG/10ML IV SOLN
INTRAVENOUS | Status: DC | PRN
  Administered 2012-01-31: 40 mg via INTRAVENOUS
  Administered 2012-01-31: 10 mg via INTRAVENOUS

## 2012-01-31 MED ORDER — SUCCINYLCHOLINE CHLORIDE 20 MG/ML IJ SOLN
INTRAMUSCULAR | Status: DC | PRN
  Administered 2012-01-31: 100 mg via INTRAVENOUS

## 2012-01-31 MED ORDER — MIDAZOLAM HCL 5 MG/5ML IJ SOLN
INTRAMUSCULAR | Status: DC | PRN
  Administered 2012-01-31: 2 mg via INTRAVENOUS

## 2012-01-31 MED ORDER — SENNOSIDES-DOCUSATE SODIUM 8.6-50 MG PO TABS
1.0000 | ORAL_TABLET | Freq: Every day | ORAL | Status: DC
  Administered 2012-02-01: 1 via ORAL
  Filled 2012-01-31 (×6): qty 1

## 2012-01-31 MED ORDER — KETOROLAC TROMETHAMINE 30 MG/ML IJ SOLN
30.0000 mg | Freq: Four times a day (QID) | INTRAMUSCULAR | Status: DC | PRN
  Administered 2012-01-31 – 2012-02-04 (×6): 30 mg via INTRAVENOUS
  Filled 2012-01-31 (×7): qty 1

## 2012-01-31 MED ORDER — ONDANSETRON HCL 4 MG/2ML IJ SOLN
4.0000 mg | Freq: Four times a day (QID) | INTRAMUSCULAR | Status: DC | PRN
  Administered 2012-01-31 – 2012-02-02 (×2): 4 mg via INTRAVENOUS
  Filled 2012-01-31 (×2): qty 2

## 2012-01-31 MED ORDER — INSULIN ASPART 100 UNIT/ML ~~LOC~~ SOLN: 0.0000 [IU] | Freq: Three times a day (TID) | SUBCUTANEOUS | Status: DC

## 2012-01-31 MED ORDER — INSULIN ASPART 100 UNIT/ML ~~LOC~~ SOLN: 0.0000 [IU] | Freq: Every day | SUBCUTANEOUS | Status: DC

## 2012-01-31 MED ORDER — SODIUM CHLORIDE 0.9 % IV SOLN
3.0000 g | Freq: Four times a day (QID) | INTRAVENOUS | Status: DC
  Administered 2012-01-31 (×2): 3 g via INTRAVENOUS
  Filled 2012-01-31 (×4): qty 3

## 2012-01-31 MED ORDER — ALUM & MAG HYDROXIDE-SIMETH 200-200-20 MG/5ML PO SUSP
30.0000 mL | Freq: Four times a day (QID) | ORAL | Status: DC | PRN
  Filled 2012-01-31: qty 30

## 2012-01-31 MED ORDER — HYDROMORPHONE HCL PF 1 MG/ML IJ SOLN
0.2500 mg | INTRAMUSCULAR | Status: DC | PRN
  Administered 2012-01-31 (×2): 0.5 mg via INTRAVENOUS

## 2012-01-31 MED ORDER — SODIUM CHLORIDE 0.9 % IJ SOLN
INTRAMUSCULAR | Status: AC
  Administered 2012-01-31: 16:00:00
  Filled 2012-01-31: qty 10

## 2012-01-31 MED ORDER — KCL IN DEXTROSE-NACL 20-5-0.45 MEQ/L-%-% IV SOLN
INTRAVENOUS | Status: DC
  Filled 2012-01-31 (×2): qty 1000

## 2012-01-31 MED ORDER — SODIUM CHLORIDE 0.9 % IR SOLN
Status: DC | PRN
  Administered 2012-01-31: 1000 mL

## 2012-01-31 MED ORDER — MORPHINE SULFATE 4 MG/ML IJ SOLN
4.0000 mg | INTRAMUSCULAR | Status: DC | PRN
  Administered 2012-02-01 (×3): 4 mg via INTRAVENOUS
  Filled 2012-01-31 (×4): qty 1

## 2012-01-31 MED ORDER — OXYCODONE HCL 5 MG PO TABS
10.0000 mg | ORAL_TABLET | ORAL | Status: DC | PRN
  Administered 2012-01-31: 10 mg via ORAL
  Filled 2012-01-31 (×2): qty 1

## 2012-01-31 MED ORDER — OXYCODONE-ACETAMINOPHEN 5-325 MG PO TABS
1.0000 | ORAL_TABLET | ORAL | Status: DC | PRN
  Administered 2012-02-01 – 2012-02-02 (×3): 1 via ORAL
  Filled 2012-01-31 (×3): qty 1
  Filled 2012-01-31: qty 2

## 2012-01-31 MED ORDER — ACETAMINOPHEN 325 MG PO TABS
650.0000 mg | ORAL_TABLET | Freq: Four times a day (QID) | ORAL | Status: DC | PRN
  Administered 2012-01-31 – 2012-02-01 (×3): 650 mg via ORAL
  Filled 2012-01-31 (×2): qty 2

## 2012-01-31 MED ORDER — PROMETHAZINE HCL 25 MG/ML IJ SOLN: 12.5000 mg | Freq: Four times a day (QID) | INTRAMUSCULAR | Status: DC | PRN

## 2012-01-31 MED ORDER — TRETINOIN 10 MG PO CAPS: 45.0000 mg/m2/d | ORAL_CAPSULE | Freq: Two times a day (BID) | ORAL | Status: DC

## 2012-01-31 MED ORDER — SODIUM CHLORIDE 0.9 % IV SOLN
INTRAVENOUS | Status: AC
  Administered 2012-01-31: 23:00:00 via INTRAVENOUS

## 2012-01-31 MED ORDER — SODIUM CHLORIDE 0.9 % IJ SOLN
INTRAMUSCULAR | Status: AC
  Administered 2012-01-31: 10 mL
  Filled 2012-01-31: qty 20

## 2012-01-31 MED ORDER — TRETINOIN 10 MG PO CAPS
45.0000 mg/m2/d | ORAL_CAPSULE | Freq: Two times a day (BID) | ORAL | Status: DC
  Administered 2012-02-01 – 2012-02-03 (×6): 50 mg via ORAL
  Filled 2012-01-31 (×3): qty 5

## 2012-01-31 MED ORDER — INSULIN ASPART 100 UNIT/ML ~~LOC~~ SOLN: 0.0000 [IU] | SUBCUTANEOUS | Status: DC

## 2012-01-31 MED ORDER — LISINOPRIL 10 MG PO TABS
10.0000 mg | ORAL_TABLET | Freq: Every day | ORAL | Status: DC
  Administered 2012-01-31: 10 mg via ORAL
  Filled 2012-01-31 (×2): qty 1

## 2012-01-31 MED ORDER — DROPERIDOL 2.5 MG/ML IJ SOLN
INTRAMUSCULAR | Status: DC | PRN
  Administered 2012-01-31: 0.625 mg via INTRAVENOUS

## 2012-01-31 MED ORDER — ONDANSETRON HCL 4 MG/2ML IJ SOLN
INTRAMUSCULAR | Status: DC | PRN
  Administered 2012-01-31: 4 mg via INTRAVENOUS

## 2012-01-31 MED ORDER — TRETINOIN 10 MG PO CAPS
45.0000 mg/m2/d | ORAL_CAPSULE | Freq: Two times a day (BID) | ORAL | Status: DC
  Filled 2012-01-31 (×2): qty 5

## 2012-01-31 MED ORDER — SODIUM CHLORIDE 0.9 % IJ SOLN
3.0000 mL | Freq: Two times a day (BID) | INTRAMUSCULAR | Status: DC
  Administered 2012-02-01 – 2012-02-02 (×3): 3 mL via INTRAVENOUS

## 2012-01-31 MED ORDER — LABETALOL HCL 5 MG/ML IV SOLN
10.0000 mg | INTRAVENOUS | Status: DC | PRN
  Administered 2012-01-31: 10 mg via INTRAVENOUS
  Filled 2012-01-31: qty 4

## 2012-01-31 MED ORDER — HEPARIN SODIUM (PORCINE) 5000 UNIT/ML IJ SOLN: 5000.0000 [IU] | Freq: Three times a day (TID) | INTRAMUSCULAR | Status: DC

## 2012-01-31 MED ORDER — SODIUM CHLORIDE 0.9 % IV SOLN
INTRAVENOUS | Status: DC
  Administered 2012-02-01 (×2): via INTRAVENOUS

## 2012-01-31 MED ORDER — INSULIN ASPART 100 UNIT/ML ~~LOC~~ SOLN
0.0000 [IU] | Freq: Once | SUBCUTANEOUS | Status: AC
  Administered 2012-01-31: 11 [IU] via SUBCUTANEOUS

## 2012-01-31 MED ORDER — INSULIN ASPART 100 UNIT/ML ~~LOC~~ SOLN
0.0000 [IU] | SUBCUTANEOUS | Status: DC
  Administered 2012-01-31: 2 [IU] via SUBCUTANEOUS
  Administered 2012-01-31 (×2): 3 [IU] via SUBCUTANEOUS

## 2012-01-31 MED ORDER — INSULIN ASPART 100 UNIT/ML ~~LOC~~ SOLN: 3.0000 [IU] | Freq: Three times a day (TID) | SUBCUTANEOUS | Status: DC

## 2012-01-31 MED ORDER — ENOXAPARIN SODIUM 40 MG/0.4ML ~~LOC~~ SOLN
40.0000 mg | SUBCUTANEOUS | Status: DC
  Administered 2012-01-31 – 2012-02-02 (×3): 40 mg via SUBCUTANEOUS
  Filled 2012-01-31 (×4): qty 0.4

## 2012-01-31 MED ORDER — SODIUM CHLORIDE 0.9 % IV SOLN
INTRAVENOUS | Status: DC | PRN
  Administered 2012-01-31: 20:00:00

## 2012-01-31 MED ORDER — SODIUM CHLORIDE 0.9 % IV SOLN
INTRAVENOUS | Status: DC
  Administered 2012-02-01: 2.3 [IU]/h via INTRAVENOUS
  Administered 2012-02-01: 2.7 [IU]/h via INTRAVENOUS
  Administered 2012-02-01: 2.3 [IU]/h via INTRAVENOUS
  Administered 2012-02-01: 4.9 [IU]/h via INTRAVENOUS
  Administered 2012-02-01: 21:00:00 via INTRAVENOUS
  Administered 2012-02-01: 1.9 [IU]/h via INTRAVENOUS
  Administered 2012-02-01: 1.8 [IU]/h via INTRAVENOUS
  Administered 2012-02-01: 3.7 [IU]/h via INTRAVENOUS
  Administered 2012-02-01: 3.8 [IU]/h via INTRAVENOUS
  Administered 2012-02-01: 1.8 [IU]/h via INTRAVENOUS
  Filled 2012-01-31 (×2): qty 1

## 2012-01-31 MED ORDER — PROPOFOL 10 MG/ML IV BOLUS
INTRAVENOUS | Status: DC | PRN
  Administered 2012-01-31: 160 mg via INTRAVENOUS

## 2012-01-31 MED ORDER — PIPERACILLIN-TAZOBACTAM 3.375 G IVPB
3.3750 g | Freq: Three times a day (TID) | INTRAVENOUS | Status: DC
  Administered 2012-01-31 – 2012-02-01 (×4): 3.375 g via INTRAVENOUS
  Filled 2012-01-31 (×6): qty 50

## 2012-01-31 MED ORDER — POLYETHYLENE GLYCOL 3350 17 G PO PACK
17.0000 g | PACK | Freq: Every day | ORAL | Status: DC
  Administered 2012-02-01 – 2012-02-02 (×2): 17 g via ORAL
  Filled 2012-01-31 (×5): qty 1

## 2012-01-31 MED ORDER — ACETAMINOPHEN 650 MG RE SUPP: 650.0000 mg | Freq: Four times a day (QID) | RECTAL | Status: DC | PRN

## 2012-01-31 MED ORDER — NEOSTIGMINE METHYLSULFATE 1 MG/ML IJ SOLN
INTRAMUSCULAR | Status: DC | PRN
  Administered 2012-01-31: 5 mg via INTRAVENOUS

## 2012-01-31 MED ORDER — GLYCOPYRROLATE 0.2 MG/ML IJ SOLN
INTRAMUSCULAR | Status: DC | PRN
  Administered 2012-01-31: .8 mg via INTRAVENOUS

## 2012-01-31 MED ORDER — SODIUM CHLORIDE 0.9 % IV SOLN
1.0000 mg/h | INTRAVENOUS | Status: DC
  Filled 2012-01-31: qty 10

## 2012-01-31 MED ORDER — METFORMIN HCL 500 MG PO TABS
1000.0000 mg | ORAL_TABLET | Freq: Every day | ORAL | Status: DC
  Filled 2012-01-31 (×2): qty 2

## 2012-01-31 MED ORDER — DEXTROSE-NACL 5-0.45 % IV SOLN
INTRAVENOUS | Status: DC
  Administered 2012-02-01: 02:00:00 via INTRAVENOUS

## 2012-01-31 MED ORDER — MAGNESIUM CHLORIDE 64 MG PO TBEC: 2.0000 | DELAYED_RELEASE_TABLET | Freq: Every day | ORAL | Status: DC

## 2012-01-31 MED ORDER — LACTATED RINGERS IV SOLN
INTRAVENOUS | Status: DC | PRN
  Administered 2012-01-31 (×2): via INTRAVENOUS

## 2012-01-31 MED ORDER — KCL IN DEXTROSE-NACL 20-5-0.45 MEQ/L-%-% IV SOLN
INTRAVENOUS | Status: DC
  Administered 2012-01-31 (×2): via INTRAVENOUS
  Filled 2012-01-31 (×5): qty 1000

## 2012-01-31 MED ORDER — FENTANYL CITRATE 0.05 MG/ML IJ SOLN
INTRAMUSCULAR | Status: DC | PRN
  Administered 2012-01-31 (×3): 50 ug via INTRAVENOUS
  Administered 2012-01-31: 100 ug via INTRAVENOUS

## 2012-01-31 MED ORDER — PROMETHAZINE HCL 25 MG PO TABS: 12.5000 mg | ORAL_TABLET | Freq: Four times a day (QID) | ORAL | Status: DC | PRN

## 2012-01-31 MED ORDER — DEXTROSE 50 % IV SOLN: 25.0000 mL | INTRAVENOUS | Status: DC | PRN

## 2012-01-31 MED ORDER — MORPHINE SULFATE 4 MG/ML IJ SOLN: 4.0000 mg | INTRAMUSCULAR | Status: DC | PRN

## 2012-01-31 MED ORDER — BUPIVACAINE-EPINEPHRINE 0.25% -1:200000 IJ SOLN
INTRAMUSCULAR | Status: DC | PRN
  Administered 2012-01-31: 25 mL

## 2012-01-31 MED ORDER — PROMETHAZINE HCL 25 MG/ML IJ SOLN
12.5000 mg | Freq: Four times a day (QID) | INTRAMUSCULAR | Status: DC | PRN
  Administered 2012-01-31: 12.5 mg via INTRAVENOUS
  Filled 2012-01-31 (×2): qty 1

## 2012-01-31 MED ORDER — MAGNESIUM OXIDE 400 (241.3 MG) MG PO TABS
200.0000 mg | ORAL_TABLET | Freq: Every day | ORAL | Status: DC
  Filled 2012-01-31 (×2): qty 0.5

## 2012-01-31 MED ORDER — DEXTROSE 5 % IV SOLN
1.0000 g | INTRAVENOUS | Status: DC
  Filled 2012-01-31: qty 1

## 2012-01-31 MED ORDER — POTASSIUM CHLORIDE 10 MEQ/100ML IV SOLN
10.0000 meq | INTRAVENOUS | Status: AC
  Administered 2012-02-01 (×4): 10 meq via INTRAVENOUS
  Filled 2012-01-31: qty 400

## 2012-01-31 SURGICAL SUPPLY — 40 items
ADH SKN CLS APL DERMABOND .7 (GAUZE/BANDAGES/DRESSINGS) ×1
APPLIER CLIP ROT 10 11.4 M/L (STAPLE) ×2
APR CLP MED LRG 11.4X10 (STAPLE) ×1
BAG SPEC RTRVL LRG 6X4 10 (ENDOMECHANICALS) ×1
BLADE SURG ROTATE 9660 (MISCELLANEOUS) IMPLANT
CANISTER SUCTION 2500CC (MISCELLANEOUS) ×2 IMPLANT
CHLORAPREP W/TINT 26ML (MISCELLANEOUS) ×3 IMPLANT
CLIP APPLIE ROT 10 11.4 M/L (STAPLE) ×1 IMPLANT
CLOTH BEACON ORANGE TIMEOUT ST (SAFETY) ×2 IMPLANT
COVER MAYO STAND STRL (DRAPES) ×2 IMPLANT
COVER SURGICAL LIGHT HANDLE (MISCELLANEOUS) ×2 IMPLANT
DECANTER SPIKE VIAL GLASS SM (MISCELLANEOUS) ×4 IMPLANT
DERMABOND ADVANCED (GAUZE/BANDAGES/DRESSINGS) ×1
DERMABOND ADVANCED .7 DNX12 (GAUZE/BANDAGES/DRESSINGS) ×1 IMPLANT
DRAPE C-ARM 42X72 X-RAY (DRAPES) ×2 IMPLANT
DRAPE UTILITY 15X26 W/TAPE STR (DRAPE) ×4 IMPLANT
DRAPE WARM FLUID 44X44 (DRAPE) ×1 IMPLANT
ELECT REM PT RETURN 9FT ADLT (ELECTROSURGICAL) ×2
ELECTRODE REM PT RTRN 9FT ADLT (ELECTROSURGICAL) ×1 IMPLANT
GLOVE BIO SURGEON STRL SZ8 (GLOVE) ×2 IMPLANT
GLOVE BIOGEL PI IND STRL 8 (GLOVE) ×1 IMPLANT
GLOVE BIOGEL PI INDICATOR 8 (GLOVE) ×1
GOWN STRL NON-REIN LRG LVL3 (GOWN DISPOSABLE) ×7 IMPLANT
KIT BASIN OR (CUSTOM PROCEDURE TRAY) ×2 IMPLANT
KIT ROOM TURNOVER OR (KITS) ×2 IMPLANT
NS IRRIG 1000ML POUR BTL (IV SOLUTION) ×4 IMPLANT
PAD ARMBOARD 7.5X6 YLW CONV (MISCELLANEOUS) ×3 IMPLANT
POUCH SPECIMEN RETRIEVAL 10MM (ENDOMECHANICALS) ×2 IMPLANT
SCISSORS LAP 5X35 DISP (ENDOMECHANICALS) IMPLANT
SET CHOLANGIOGRAPH 5 50 .035 (SET/KITS/TRAYS/PACK) ×2 IMPLANT
SET IRRIG TUBING LAPAROSCOPIC (IRRIGATION / IRRIGATOR) ×2 IMPLANT
SLEEVE ENDOPATH XCEL 5M (ENDOMECHANICALS) ×2 IMPLANT
SPECIMEN JAR SMALL (MISCELLANEOUS) ×2 IMPLANT
SUT MNCRL AB 4-0 PS2 18 (SUTURE) ×2 IMPLANT
TOWEL OR 17X24 6PK STRL BLUE (TOWEL DISPOSABLE) ×2 IMPLANT
TOWEL OR 17X26 10 PK STRL BLUE (TOWEL DISPOSABLE) ×2 IMPLANT
TRAY LAPAROSCOPIC (CUSTOM PROCEDURE TRAY) ×2 IMPLANT
TROCAR XCEL BLUNT TIP 100MML (ENDOMECHANICALS) ×2 IMPLANT
TROCAR XCEL NON-BLD 11X100MML (ENDOMECHANICALS) ×2 IMPLANT
TROCAR XCEL NON-BLD 5MMX100MML (ENDOMECHANICALS) ×2 IMPLANT

## 2012-01-31 NOTE — H&P (View-Only) (Signed)
Pt transfer to 3300 due to increase in HR and tachypnea.  She is having more RUQ pain.  On exam she is stable but very tender RUQ.  She seems worse to me.  I discussed proceeding to the OR for LAP chole tonight.  She is not yet in septic shock but given history of leukemia I'm concerned about her immune state and would rather proceed sooner.  She agrees .  The procedure has been discussed with the patient. Operative and non operative treatments have been discussed. Risks of surgery include bleeding, infection,  Common bile duct injury,  Injury to the stomach,liver, colon,small intestine, abdominal wall,  Diaphragm,  Major blood vessels,  And the need for an open procedure.  Other risks include worsening of medical problems, death,  DVT and pulmonary embolism, and cardiovascular events.   Medical options have also been discussed. The patient has been informed of long term expectations of surgery and non surgical options,  The patient agrees to proceed.

## 2012-01-31 NOTE — Consult Note (Signed)
[facility] INPATIENT PROGRESS NOTE  Name: Melissa Washington      MRN: [id number]    Location: 6715/6715-01  Date: 01/31/2012 Time:12:38 PM   Subjective: Interval History:Melissa Washington reported having fever and abdominal pain.  This is her 3rd admission in less than a month for similar presentation.  She reported that she has been having high grade fever up to 103F at home.  She has been having constant right upper quadrant pain, crampy; no radiation; moderate to severe; worsened with eating; associated with nausea/vomiting; not tolerating PO.  She denies SOB, mouth sore, headache, nuchal rigidity, chest pain, visible source of bleeding.   Objective: Vital signs in last 24 hours: Temp:  [98.7 F (37.1 C)-103.9 F (39.9 C)] 100.1 F (37.8 C) (05/12 1228) Pulse Rate:  [113-131] 131  (05/12 1228) Resp:  [17-20] 18  (05/12 1228) BP: (116-151)/(57-89) 143/61 mmHg (05/12 1228) SpO2:  [97 %-100 %] 100 % (05/12 1047) Weight:  [284 lb 13.4 oz (129.2 kg)] 284 lb 13.4 oz (129.2 kg) (05/12 0039)     PHYSICAL EXAM:  Gen: obese woman, diaphoretic, lethargic-appearing, no increased work of breathing.  Eyes: No scleral icterus or jaundice. ENT: There was positive oral thrush. Neck was supple without thyromegaly. Lymphatics: Negative for cervical, supraclavicular, axillary, or inguinal adenopathy.  Respiratory: Lungs were clear bilaterally without wheezing or crackles. Cardiovascular: tachy but regular, S1/S2; without murmur, rubs, or gallop. There was no pedal edema. GI: Abdomen was soft, obese, nondistended, without organomegaly. There was RUQ pain on palpation.  There was no rebound tenderness or peritoneal sign.  Musculoskeletal exam: No spinal tenderness on palpation of vertebral spine. Skin exam was without ecchymosis, petechiae. Neuro exam was nonfocal.  Patient was alert and oriented. Attention was good. Language was appropriate. Mood was normal without depression. Speech was not  pressured. Thought content was not tangential.          Studies/Results: Results for orders placed during the hospital encounter of 01/30/12 (from the past 48 hour(s))  URINALYSIS, ROUTINE W REFLEX MICROSCOPIC     Status: Abnormal   Collection Time   01/30/12  1:46 PM      Component Value Range Comment   Color, Urine YELLOW  YELLOW     APPearance CLEAR  CLEAR     Specific Gravity, Urine 1.020  1.005 - 1.030     pH 5.5  5.0 - 8.0     Glucose, UA >1000 (*) NEGATIVE (mg/dL)    Hgb urine dipstick NEGATIVE  NEGATIVE     Bilirubin Urine NEGATIVE  NEGATIVE     Ketones, ur NEGATIVE  NEGATIVE (mg/dL)    Protein, ur NEGATIVE  NEGATIVE (mg/dL)    Urobilinogen, UA 0.2  0.0 - 1.0 (mg/dL)    Nitrite NEGATIVE  NEGATIVE     Leukocytes, UA TRACE (*) NEGATIVE    URINE MICROSCOPIC-ADD ON     Status: Normal   Collection Time   01/30/12  1:46 PM      Component Value Range Comment   Squamous Epithelial / LPF RARE  RARE     WBC, UA 7-10  <3 (WBC/hpf)    Bacteria, UA RARE  RARE    POCT PREGNANCY, URINE     Status: Normal   Collection Time   01/30/12  1:49 PM      Component Value Range Comment   Preg Test, Ur NEGATIVE  NEGATIVE    CULTURE, BLOOD (ROUTINE X 2)     Status: Normal (  Preliminary result)   Collection Time   01/30/12  2:51 PM      Component Value Range Comment   Specimen Description BLOOD RIGHT ARM      Special Requests BOTTLES DRAWN AEROBIC AND ANAEROBIC 10CC      Culture  Setup Time [id number]      Culture        Value: GRAM NEGATIVE RODS     Note: Gram Stain Report Called to,Read Back By and Verified With: RN A. COLE ON 01/31/12 AT 1110 BY DTERRY   Report Status PENDING     CULTURE, BLOOD (ROUTINE X 2)     Status: Normal (Preliminary result)   Collection Time   01/30/12  2:58 PM      Component Value Range Comment   Specimen Description BLOOD LEFT ARM      Special Requests BOTTLES DRAWN AEROBIC AND ANAEROBIC 10CC      Culture  Setup Time [id number]      Culture        Value:         BLOOD CULTURE RECEIVED NO GROWTH TO DATE CULTURE WILL BE HELD FOR 5 DAYS BEFORE ISSUING A FINAL NEGATIVE REPORT   Report Status PENDING     COMPREHENSIVE METABOLIC PANEL     Status: Abnormal   Collection Time   01/30/12  2:59 PM      Component Value Range Comment   Sodium 133 (*) 135 - 145 (mEq/L)    Potassium 3.8  3.5 - 5.1 (mEq/L)    Chloride 97  96 - 112 (mEq/L)    CO2 17 (*) 19 - 32 (mEq/L)    Glucose, Bld 334 (*) 70 - 99 (mg/dL)    BUN 11  6 - 23 (mg/dL)    Creatinine, Ser 6.57  0.50 - 1.10 (mg/dL)    Calcium 9.6  8.4 - 10.5 (mg/dL)    Total Protein 7.3  6.0 - 8.3 (g/dL)    Albumin 3.7  3.5 - 5.2 (g/dL)    AST 13  0 - 37 (U/L)    ALT 23  0 - 35 (U/L)    Alkaline Phosphatase 106  39 - 117 (U/L)    Total Bilirubin 0.5  0.3 - 1.2 (mg/dL)    GFR calc non Af Amer >90  >90 (mL/min)    GFR calc Af Amer >90  >90 (mL/min)   LACTIC ACID, PLASMA     Status: Abnormal   Collection Time   01/30/12  3:00 PM      Component Value Range Comment   Lactic Acid, Venous 3.3 (*) 0.5 - 2.2 (mmol/L)   CBC     Status: Abnormal   Collection Time   01/30/12  3:05 PM      Component Value Range Comment   WBC 14.4 (*) 4.0 - 10.5 (K/uL)    RBC 3.47 (*) 3.87 - 5.11 (MIL/uL)    Hemoglobin 10.5 (*) 12.0 - 15.0 (g/dL)    HCT 84.6 (*) 96.2 - 46.0 (%)    MCV 85.9  78.0 - 100.0 (fL)    MCH 30.3  26.0 - 34.0 (pg)    MCHC 35.2  30.0 - 36.0 (g/dL)    RDW 95.2  84.1 - 32.4 (%)    Platelets 392  150 - 400 (K/uL)   DIFFERENTIAL     Status: Abnormal   Collection Time   01/30/12  3:05 PM      Component Value Range Comment   Neutrophils Relative 82 (*)  43 - 77 (%)    Neutro Abs 11.8 (*) 1.7 - 7.7 (K/uL)    Lymphocytes Relative 12  12 - 46 (%)    Lymphs Abs 1.7  0.7 - 4.0 (K/uL)    Monocytes Relative 6  3 - 12 (%)    Monocytes Absolute 0.8  0.1 - 1.0 (K/uL)    Eosinophils Relative 1  0 - 5 (%)    Eosinophils Absolute 0.1  0.0 - 0.7 (K/uL)    Basophils Relative 0  0 - 1 (%)    Basophils Absolute 0.0  0.0  - 0.1 (K/uL)   GLUCOSE, CAPILLARY     Status: Abnormal   Collection Time   01/30/12  5:10 PM      Component Value Range Comment   Glucose-Capillary 285 (*) 70 - 99 (mg/dL)    Comment 1 Documented in Chart      Comment 2 Notify RN     GLUCOSE, CAPILLARY     Status: Abnormal   Collection Time   01/30/12  6:29 PM      Component Value Range Comment   Glucose-Capillary 231 (*) 70 - 99 (mg/dL)    Comment 1 Notify RN      Comment 2 Documented in Chart     BASIC METABOLIC PANEL     Status: Abnormal   Collection Time   01/30/12  6:42 PM      Component Value Range Comment   Sodium 136  135 - 145 (mEq/L)    Potassium 3.9  3.5 - 5.1 (mEq/L)    Chloride 101  96 - 112 (mEq/L)    CO2 20  19 - 32 (mEq/L)    Glucose, Bld 272 (*) 70 - 99 (mg/dL)    BUN 10  6 - 23 (mg/dL)    Creatinine, Ser 1.61  0.50 - 1.10 (mg/dL)    Calcium 9.5  8.4 - 10.5 (mg/dL)    GFR calc non Af Amer >90  >90 (mL/min)    GFR calc Af Amer >90  >90 (mL/min)   BASIC METABOLIC PANEL     Status: Abnormal   Collection Time   01/30/12  8:19 PM      Component Value Range Comment   Sodium 136  135 - 145 (mEq/L)    Potassium 3.4 (*) 3.5 - 5.1 (mEq/L)    Chloride 104  96 - 112 (mEq/L)    CO2 18 (*) 19 - 32 (mEq/L)    Glucose, Bld 206 (*) 70 - 99 (mg/dL)    BUN 8  6 - 23 (mg/dL)    Creatinine, Ser 0.96  0.50 - 1.10 (mg/dL)    Calcium 9.1  8.4 - 10.5 (mg/dL)    GFR calc non Af Amer >90  >90 (mL/min)    GFR calc Af Amer >90  >90 (mL/min)   GLUCOSE, CAPILLARY     Status: Abnormal   Collection Time   01/31/12 12:49 AM      Component Value Range Comment   Glucose-Capillary 195 (*) 70 - 99 (mg/dL)   BASIC METABOLIC PANEL     Status: Abnormal   Collection Time   01/31/12  3:55 AM      Component Value Range Comment   Sodium 137  135 - 145 (mEq/L)    Potassium 3.3 (*) 3.5 - 5.1 (mEq/L)    Chloride 101  96 - 112 (mEq/L)    CO2 18 (*) 19 - 32 (mEq/L)    Glucose, Bld 225 (*) 70 -  99 (mg/dL)    BUN 5 (*) 6 - 23 (mg/dL)    Creatinine,  Ser 4.54  0.50 - 1.10 (mg/dL)    Calcium 9.3  8.4 - 10.5 (mg/dL)    GFR calc non Af Amer >90  >90 (mL/min)    GFR calc Af Amer >90  >90 (mL/min)   CBC     Status: Abnormal   Collection Time   01/31/12  3:55 AM      Component Value Range Comment   WBC 20.2 (*) 4.0 - 10.5 (K/uL)    RBC 3.39 (*) 3.87 - 5.11 (MIL/uL)    Hemoglobin 10.2 (*) 12.0 - 15.0 (g/dL)    HCT 09.8 (*) 11.9 - 46.0 (%)    MCV 85.3  78.0 - 100.0 (fL)    MCH 30.1  26.0 - 34.0 (pg)    MCHC 35.3  30.0 - 36.0 (g/dL)    RDW 14.7  82.9 - 56.2 (%)    Platelets 352  150 - 400 (K/uL)   GLUCOSE, CAPILLARY     Status: Abnormal   Collection Time   01/31/12  4:11 AM      Component Value Range Comment   Glucose-Capillary 213 (*) 70 - 99 (mg/dL)   GLUCOSE, CAPILLARY     Status: Abnormal   Collection Time   01/31/12 10:51 AM      Component Value Range Comment   Glucose-Capillary 222 (*) 70 - 99 (mg/dL)   LIPASE, BLOOD     Status: Normal   Collection Time   01/31/12 11:00 AM      Component Value Range Comment   Lipase 53  11 - 59 (U/L)   GLUCOSE, CAPILLARY     Status: Abnormal   Collection Time   01/31/12 11:41 AM      Component Value Range Comment   Glucose-Capillary 224 (*) 70 - 99 (mg/dL)    Dg Chest 2 View  10/21/8655  *RADIOLOGY REPORT*  Clinical Data: Fever  CHEST - 2 VIEW  Comparison: 01/19/2012  Findings: Cardiomegaly again noted.  Right IJ Port-A-Cath is unchanged in position.  Bony thorax is stable.  No acute infiltrate or pulmonary edema.  Stable mild elevation of the right hemidiaphragm with right basilar atelectasis.  IMPRESSION: No active disease.  No significant change.  Original Report Authenticated By: Natasha Mead, M.D.   US Abdomen Complete  01/31/2012  *RADIOLOGY REPORT*  Clinical Data:   cholelithiasis, fever, abdominal pain  COMPLETE ABDOMINAL ULTRASOUND  Comparison:  01/23/2012  Findings:  Gallbladder:  Again noted multiple mobile gallstones within gallbladder.  The largest measures about 6.5 mm.  No thickening  of gallbladder wall.  No sonographic Murphy's sign.  Common bile duct:  Measures 4 mm in diameter within normal limits.  Liver:  No focal lesion identified. Again noted diffuse increased echogenicity of the liver consistent with fatty infiltration.  No intrahepatic biliary ductal dilatation.  IVC:  Appears normal.  Pancreas:  No focal abnormality seen.  Spleen:  Measures 7.3 cm in length.  Normal echogenicity.  Right Kidney:  Measures 13 cm in length.  No mass, hydronephrosis or diagnostic renal calculus  Left Kidney:  Measures 13.1 cm in length.  No mass, hydronephrosis or diagnostic renal calculus  Abdominal aorta:  No aneurysm identified. Measures up to 1.8 cm in diameter.  Small amount of free fluid noted in Morison's pouch.  IMPRESSION:  1.  Again noted multiple mobile gallstones within gallbladder.  No thickening of the gallbladder wall or sonographic Murphy's  sign. 2.  Normal CBD. 3.  No hydronephrosis or diagnostic renal calculus. 4.  Again noted fatty infiltration of the liver.  Original Report Authenticated By: Natasha Mead, M.D.     MEDICATIONS:  Reviewed.     Assessment/Plan:  1.  DM-type II; presented with DKA:  She is on insulin protocol per primary team.  2.  Fever, Gram-negative sepsis physiology:  I personally discussed the case with house staff Dr. Tye Savoy that given patient's tenuous status, I recommended ICU or at least step down for close monitoring.   I agree with broad antibiotic coverage with Zosyn.  3.  Recent diagnosis of APML:   - she is currently on induction chemotherapy with arsenic and retinoic acid.  Last dose of arsenic was given on 01/15/2012.   Further administration of arsenic has been on hold due to recurrent admissions for fever, abdominal pain. - I recommended to resume retinoic acid (Vesanoid) to help getting patient into remission.  This medication works by promoting differentiation of promyelocytes into mature neutrophils.  It does not cause pancytopenia.  She  is at low risk of dedifferentiation since she started on induction chemo in March 2013.   4.  Abdominal pain:   - 2/2 gallstones.  There is no sign of cholecystitis or choledocholithiasis; however, this is her 3rd admission in less than a month.  I agree with General Surgery consultation to see if cholecystectomy is indicated once her sepsis resolves.

## 2012-01-31 NOTE — H&P (Signed)
I interviewed and examined this patient and discussed the care plan with Dr. Fara Boros and the [facility] team and agree with assessment and plan as documented in the admission note for yesterday. We began Unisyn for concern for cholecystitis. Due to her fever while being treated for leukemia, we will consult Oncology and Surgery for her fever and recurrent RUQ pain.    Melissa Washington A. Sheffield Slider, MD Family Medicine Teaching Service Attending  01/31/2012 1:33 PM

## 2012-01-31 NOTE — Transfer of Care (Signed)
Immediate Anesthesia Transfer of Care Note  Patient: Melissa Washington  Procedure(s) Performed: Procedure(s) (LRB): LAPAROSCOPIC CHOLECYSTECTOMY WITH INTRAOPERATIVE CHOLANGIOGRAM (N/A)  Patient Location: PACU  Anesthesia Type: General  Level of Consciousness: sedated  Airway & Oxygen Therapy: Patient connected to nasal cannula oxygen  Post-op Assessment: Report given to PACU RN and Post -op Vital signs reviewed and stable  Post vital signs: Reviewed and stable  Complications: No apparent anesthesia complications

## 2012-01-31 NOTE — Progress Notes (Signed)
Pt transfer to 3300 due to increase in HR and tachypnea.  She is having more RUQ pain.  On exam she is stable but very tender RUQ.  She seems worse to me.  I discussed proceeding to the OR for LAP chole tonight.  She is not yet in septic shock but given history of leukemia I'm concerned about her immune state and would rather proceed sooner.  She agrees .  The procedure has been discussed with the patient. Operative and non operative treatments have been discussed. Risks of surgery include bleeding, infection,  Common bile duct injury,  Injury to the stomach,liver, colon,small intestine, abdominal wall,  Diaphragm,  Major blood vessels,  And the need for an open procedure.  Other risks include worsening of medical problems, death,  DVT and pulmonary embolism, and cardiovascular events.   Medical options have also been discussed. The patient has been informed of long term expectations of surgery and non surgical options,  The patient agrees to proceed.   

## 2012-01-31 NOTE — Progress Notes (Signed)
Patient ID: Melissa Washington, female   DOB: [birth year], 25 y.o.   MRN: [id number]  FPTS Interim Note  Discussed case with Dr. Gaylyn Rong (Oncology).  He agrees with Zosyn coverage for Gram negative rod bacteremia.  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  Dr. Gaylyn Rong also recommended transfer to SDU for close monitoring of tachycardia, fever, and worsening clinical symptoms.  I agree with transfer.  I have also consulted General Surgery to evaluate for multiple gallbladder stones per U/S.  I greatly appreciate consult and recommendations.  DE LA CRUZ,Dilcia Rybarczyk 01/31/12 1:07 PM

## 2012-01-31 NOTE — Progress Notes (Addendum)
Missy RN in e-link and Dr. Tye Savoy updated on pt's condition-- no fever, HR now lower (in the 110s), increased respirations but no SHOB or discomfort. New orders received from Dr. Tye Savoy about pt's insulin orders. Renette Butters, Viona Gilmore

## 2012-01-31 NOTE — Anesthesia Postprocedure Evaluation (Signed)
  Anesthesia Post-op Note  Patient: Melissa Washington  Procedure(s) Performed: Procedure(s) (LRB): LAPAROSCOPIC CHOLECYSTECTOMY WITH INTRAOPERATIVE CHOLANGIOGRAM (N/A)  Patient Location: PACU  Anesthesia Type: General  Level of Consciousness: awake  Airway and Oxygen Therapy: Patient Spontanous Breathing and Patient connected to nasal cannula oxygen  Post-op Pain: none  Post-op Assessment: Post-op Vital signs reviewed, Patient's Cardiovascular Status Stable, Respiratory Function Stable and Patent Airway  Post-op Vital Signs: Reviewed and stable  Complications: No apparent anesthesia complications

## 2012-01-31 NOTE — Progress Notes (Signed)
I interviewed and examined this patient and discussed the care plan with Dr. Tye Savoy and the FPTS team and agree with assessment and plan as documented in the progress note for today. Her ultrasound did not confirm cholecystitis though gall stones apparent in the GB. With her increasing fever and gram negatives in blood culture we will broaden her antibiotic coverage.    Zerick Prevette A. Sheffield Slider, MD Family Medicine Teaching Service Attending  01/31/2012 1:36 PM

## 2012-01-31 NOTE — Op Note (Signed)
Melissa Washington 1986-08-24 [id number] 01/30/2012  Preoperative diagnosis: acute cholecystitis  Postoperative diagnosis: same  Procedure: alaparoscopic cholecystectomy with cholangiogram  Surgeon: Harriette Bouillon, MD, FACS  Assistant surgeon: Ovidio Kin M.D.   Anesthesia: General  Clinical History and Indications: This patient has known gallstones AND ACUTE CHOLECYSTITIS  and comes in today for cholecystectomy.  Description of procedure: The patient was seen in the preoperative area. I reviewed the plans for the procedure with her as well as the risks and complications. She had no further questions.  The patient was taken to the operating room. After satisfactory general endotracheal anesthesia had been obtained the abdomen was prepped and draped. A time out was done.  0.25% plain Marcaine was used at all incisions. I made an umbilical incision, identified the fascia and opened that, and entered the peritoneal cavity under direct vision. A 0 Vicryl pursestring suture was placed and the Hasson cannula was introduced under direct vision and secured with the pursestring. The abdomen was inflated to 15 cm.  The camera was placed and there were no gross abnormalities. The patient was then placed in reverse Trendelenburg and tilted to the left. A 10/11 trocar was placed in the epigastrium and two 5 mm trochars placed laterally all under direct vision.  There was acute early inflammation of the gallbladder.The cystic duct and artery were dissected out and the critical view was achieved. The cystic duct was clipped on the gallbladder side  And small incision was made for cholangiogram.  An intraoperative cholangiogram was then performed. A Cook catheter was introduced percutaneously and placed in the cystic duct. The cholangiogram showed good filling of the common duct and hepatic radicals and free flow into the duodenum. No abnormalities were noted.  The catheter was removed and 3 clips  placed on the stay side of the cystic duct. The duct was then divided.  Additional clips are placed on the cystic artery and it was divided. The gallbladder was then removed from below to above the coagulation current of the cautery. It was then placed in a bag to be retrieved later.  The abdomen was irrigated and a check for hemostasis along the bed of the gallbladder made. Once everything appeared to be dry we were able to move the camera to the epigastric port and removed the gallbladder through the umbilical port.  The abdomen was reinsufflated and a final check for hemostasis made. There is no evidence of bleeding or bile leakage. The lateral ports were removed under direct vision and there was no bleeding. The umbilical site was closed with a pursestring, watching with the camera in the epigastric port. The abdomen was then deflated through the epigastric port and that was removed. Skin was closed with 4-0 Monocryl subcuticular and Dermabond.  The patient tolerated the procedure well. There were no operative complications. EBL was minimal. All counts were correct.  Dortha Schwalbe, MD, FACS 01/31/2012 8:44 PM

## 2012-01-31 NOTE — Consult Note (Signed)
Reason for Consult:abdominal pain Referring Physician: Dr Scarlett [name] is an 26 y.o. female.  HPI: asked to see pt at the request of Dr Melissa Come do to abdominal pain. She has a history of intermittent abdominal pain for a number of months. It is located in the right upper quadrant and is associated with nausea and vomiting. Unclear if it brings this on. She is currently eating but has nausea and pain. The pain is sharp in nature located in her right upper quadrant without radiation and associated with nausea and vomiting. The pain is severe. 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 This is apparently in remission.  Past Medical History  Diagnosis Date  . Diabetes mellitus   . Hypertension   . Nephrolithiasis   . 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 Hepatic steatosis 01/17/2012  . Cholelithiasis 01/17/2012    Past Surgical History  Procedure Date  . Portacath placement     [facility]    Family History  Problem Relation Age of Onset  . Kidney disease Mother   . Kidney disease Maternal Grandmother   . Diabetes Maternal Grandmother   . Hypertension Mother   . Diabetes Father     Social History:  reports that she has never smoked. She does not have any smokeless tobacco history on file. She reports that she does not drink alcohol or use illicit drugs.  Allergies:  Allergies  Allergen Reactions  . Ultram (Tramadol Hcl) Nausea And Vomiting    Medications:  I have reviewed the patient's current medications. Prior to Admission:  Prescriptions prior to admission  Medication Sig Dispense Refill  . ibuprofen (ADVIL) 800 MG tablet Take 1 tablet (800 mg total) by mouth every 8 (eight) hours as needed for pain.  30 tablet  0  . lisinopril (PRINIVIL,ZESTRIL) 10 MG tablet Take 1 tablet (10 mg total) by mouth daily.  30 tablet  4  . magnesium chloride (SLOW-MAG) 64 MG TBEC Take 2 tablets by mouth daily.      . metFORMIN  (GLUCOPHAGE) 1000 MG tablet Take 1 tablet (1,000 mg total) by mouth daily with breakfast.  90 tablet  4  . promethazine (PHENERGAN) 12.5 MG tablet Take 12.5 mg by mouth every 8 (eight) hours as needed. For nausea      . tretinoin (VESANOID) 10 MG capsule Take 50 mg by mouth 2 (two) times daily.       Scheduled:   . acetaminophen  650 mg Oral Once  . cefTRIAXone (ROCEPHIN) IVPB 1 gram/50 mL D5W  1 g Intravenous Once  . enoxaparin  40 mg Subcutaneous Q24H  . insulin aspart  0-9 Units Subcutaneous Q4H  . insulin aspart  10 Units Subcutaneous Once  . lisinopril  10 mg Oral Daily  . magnesium oxide  200 mg Oral Daily  . metFORMIN  1,000 mg Oral Q breakfast  . ondansetron (ZOFRAN) IV  4 mg Intravenous Once  . piperacillin-tazobactam (ZOSYN)  IV  3.375 g Intravenous Q8H  . polyethylene glycol  17 g Oral Daily  . senna-docusate  1 tablet Oral QHS  . sodium chloride  1,000 mL Intravenous Once  . sodium chloride  2,000 mL Intravenous Once  . sodium chloride  3 mL Intravenous Q12H  . tretinoin  45 mg/m2/day Oral BID WC  . DISCONTD: ampicillin-sulbactam (UNASYN) IV  3 g Intravenous Q6H  . DISCONTD: magnesium chloride  2 tablet  Oral Daily   Continuous:   . dextrose 5 % and 0.45 % NaCl with KCl 20 mEq/L 100 mL/hr at 01/31/12 0500  . DISCONTD: sodium chloride Stopped (01/30/12 2335)   VOZ:[id number], acetaminophen, alum & mag hydroxide-simeth, ketorolac, ondansetron (ZOFRAN) IV, oxyCODONE, promethazine, DISCONTD: promethazine  Results for orders placed during the hospital encounter of 01/30/12 (from the past 48 hour(s))  URINALYSIS, ROUTINE W REFLEX MICROSCOPIC     Status: Abnormal   Collection Time   01/30/12  1:46 PM      Component Value Range Comment   Color, Urine YELLOW  YELLOW     APPearance CLEAR  CLEAR     Specific Gravity, Urine 1.020  1.005 - 1.030     pH 5.5  5.0 - 8.0     Glucose, UA >1000 (*) NEGATIVE (mg/dL)    Hgb urine dipstick NEGATIVE  NEGATIVE     Bilirubin Urine  NEGATIVE  NEGATIVE     Ketones, ur NEGATIVE  NEGATIVE (mg/dL)    Protein, ur NEGATIVE  NEGATIVE (mg/dL)    Urobilinogen, UA 0.2  0.0 - 1.0 (mg/dL)    Nitrite NEGATIVE  NEGATIVE     Leukocytes, UA TRACE (*) NEGATIVE    URINE MICROSCOPIC-ADD ON     Status: Normal   Collection Time   01/30/12  1:46 PM      Component Value Range Comment   Squamous Epithelial / LPF RARE  RARE     WBC, UA 7-10  <3 (WBC/hpf)    Bacteria, UA RARE  RARE    POCT PREGNANCY, URINE     Status: Normal   Collection Time   01/30/12  1:49 PM      Component Value Range Comment   Preg Test, Ur NEGATIVE  NEGATIVE    CULTURE, BLOOD (ROUTINE X 2)     Status: Normal (Preliminary result)   Collection Time   01/30/12  2:51 PM      Component Value Range Comment   Specimen Description BLOOD RIGHT ARM      Special Requests BOTTLES DRAWN AEROBIC AND ANAEROBIC 10CC      Culture  Setup Time [id number]      Culture        Value: GRAM NEGATIVE RODS     Note: Gram Stain Report Called to,Read Back By and Verified With: RN A. COLE ON 01/31/12 AT 1110 BY DTERRY   Report Status PENDING     CULTURE, BLOOD (ROUTINE X 2)     Status: Normal (Preliminary result)   Collection Time   01/30/12  2:58 PM      Component Value Range Comment   Specimen Description BLOOD LEFT ARM      Special Requests BOTTLES DRAWN AEROBIC AND ANAEROBIC 10CC      Culture  Setup Time [id number]      Culture        Value:        BLOOD CULTURE RECEIVED NO GROWTH TO DATE CULTURE WILL BE HELD FOR 5 DAYS BEFORE ISSUING A FINAL NEGATIVE REPORT   Report Status PENDING     COMPREHENSIVE METABOLIC PANEL     Status: Abnormal   Collection Time   01/30/12  2:59 PM      Component Value Range Comment   Sodium 133 (*) 135 - 145 (mEq/L)    Potassium 3.8  3.5 - 5.1 (mEq/L)    Chloride 97  96 - 112 (mEq/L)    CO2 17 (*) 19 - 32 (mEq/L)  Glucose, Bld 334 (*) 70 - 99 (mg/dL)    BUN 11  6 - 23 (mg/dL)    Creatinine, Ser 6.21  0.50 - 1.10 (mg/dL)    Calcium 9.6  8.4 - 10.5  (mg/dL)    Total Protein 7.3  6.0 - 8.3 (g/dL)    Albumin 3.7  3.5 - 5.2 (g/dL)    AST 13  0 - 37 (U/L)    ALT 23  0 - 35 (U/L)    Alkaline Phosphatase 106  39 - 117 (U/L)    Total Bilirubin 0.5  0.3 - 1.2 (mg/dL)    GFR calc non Af Amer >90  >90 (mL/min)    GFR calc Af Amer >90  >90 (mL/min)   LACTIC ACID, PLASMA     Status: Abnormal   Collection Time   01/30/12  3:00 PM      Component Value Range Comment   Lactic Acid, Venous 3.3 (*) 0.5 - 2.2 (mmol/L)   CBC     Status: Abnormal   Collection Time   01/30/12  3:05 PM      Component Value Range Comment   WBC 14.4 (*) 4.0 - 10.5 (K/uL)    RBC 3.47 (*) 3.87 - 5.11 (MIL/uL)    Hemoglobin 10.5 (*) 12.0 - 15.0 (g/dL)    HCT 30.8 (*) 65.7 - 46.0 (%)    MCV 85.9  78.0 - 100.0 (fL)    MCH 30.3  26.0 - 34.0 (pg)    MCHC 35.2  30.0 - 36.0 (g/dL)    RDW 84.6  96.2 - 95.2 (%)    Platelets 392  150 - 400 (K/uL)   DIFFERENTIAL     Status: Abnormal   Collection Time   01/30/12  3:05 PM      Component Value Range Comment   Neutrophils Relative 82 (*) 43 - 77 (%)    Neutro Abs 11.8 (*) 1.7 - 7.7 (K/uL)    Lymphocytes Relative 12  12 - 46 (%)    Lymphs Abs 1.7  0.7 - 4.0 (K/uL)    Monocytes Relative 6  3 - 12 (%)    Monocytes Absolute 0.8  0.1 - 1.0 (K/uL)    Eosinophils Relative 1  0 - 5 (%)    Eosinophils Absolute 0.1  0.0 - 0.7 (K/uL)    Basophils Relative 0  0 - 1 (%)    Basophils Absolute 0.0  0.0 - 0.1 (K/uL)   GLUCOSE, CAPILLARY     Status: Abnormal   Collection Time   01/30/12  5:10 PM      Component Value Range Comment   Glucose-Capillary 285 (*) 70 - 99 (mg/dL)    Comment 1 Documented in Chart      Comment 2 Notify RN     GLUCOSE, CAPILLARY     Status: Abnormal   Collection Time   01/30/12  6:29 PM      Component Value Range Comment   Glucose-Capillary 231 (*) 70 - 99 (mg/dL)    Comment 1 Notify RN      Comment 2 Documented in Chart     BASIC METABOLIC PANEL     Status: Abnormal   Collection Time   01/30/12  6:42 PM       Component Value Range Comment   Sodium 136  135 - 145 (mEq/L)    Potassium 3.9  3.5 - 5.1 (mEq/L)    Chloride 101  96 - 112 (mEq/L)    CO2 20  19 - 32 (mEq/L)    Glucose, Bld 272 (*) 70 - 99 (mg/dL)    BUN 10  6 - 23 (mg/dL)    Creatinine, Ser 4.09  0.50 - 1.10 (mg/dL)    Calcium 9.5  8.4 - 10.5 (mg/dL)    GFR calc non Af Amer >90  >90 (mL/min)    GFR calc Af Amer >90  >90 (mL/min)   BASIC METABOLIC PANEL     Status: Abnormal   Collection Time   01/30/12  8:19 PM      Component Value Range Comment   Sodium 136  135 - 145 (mEq/L)    Potassium 3.4 (*) 3.5 - 5.1 (mEq/L)    Chloride 104  96 - 112 (mEq/L)    CO2 18 (*) 19 - 32 (mEq/L)    Glucose, Bld 206 (*) 70 - 99 (mg/dL)    BUN 8  6 - 23 (mg/dL)    Creatinine, Ser 8.11  0.50 - 1.10 (mg/dL)    Calcium 9.1  8.4 - 10.5 (mg/dL)    GFR calc non Af Amer >90  >90 (mL/min)    GFR calc Af Amer >90  >90 (mL/min)   GLUCOSE, CAPILLARY     Status: Abnormal   Collection Time   01/31/12 12:49 AM      Component Value Range Comment   Glucose-Capillary 195 (*) 70 - 99 (mg/dL)   BASIC METABOLIC PANEL     Status: Abnormal   Collection Time   01/31/12  3:55 AM      Component Value Range Comment   Sodium 137  135 - 145 (mEq/L)    Potassium 3.3 (*) 3.5 - 5.1 (mEq/L)    Chloride 101  96 - 112 (mEq/L)    CO2 18 (*) 19 - 32 (mEq/L)    Glucose, Bld 225 (*) 70 - 99 (mg/dL)    BUN 5 (*) 6 - 23 (mg/dL)    Creatinine, Ser 9.14  0.50 - 1.10 (mg/dL)    Calcium 9.3  8.4 - 10.5 (mg/dL)    GFR calc non Af Amer >90  >90 (mL/min)    GFR calc Af Amer >90  >90 (mL/min)   CBC     Status: Abnormal   Collection Time   01/31/12  3:55 AM      Component Value Range Comment   WBC 20.2 (*) 4.0 - 10.5 (K/uL)    RBC 3.39 (*) 3.87 - 5.11 (MIL/uL)    Hemoglobin 10.2 (*) 12.0 - 15.0 (g/dL)    HCT 78.2 (*) 95.6 - 46.0 (%)    MCV 85.3  78.0 - 100.0 (fL)    MCH 30.1  26.0 - 34.0 (pg)    MCHC 35.3  30.0 - 36.0 (g/dL)    RDW 21.3  08.6 - 57.8 (%)    Platelets 352  150 - 400  (K/uL)   GLUCOSE, CAPILLARY     Status: Abnormal   Collection Time   01/31/12  4:11 AM      Component Value Range Comment   Glucose-Capillary 213 (*) 70 - 99 (mg/dL)   GLUCOSE, CAPILLARY     Status: Abnormal   Collection Time   01/31/12 10:51 AM      Component Value Range Comment   Glucose-Capillary 222 (*) 70 - 99 (mg/dL)   LIPASE, BLOOD     Status: Normal   Collection Time   01/31/12 11:00 AM      Component Value Range Comment   Lipase 53  11 - 59 (U/L)   GLUCOSE, CAPILLARY     Status: Abnormal   Collection Time   01/31/12 11:41 AM      Component Value Range Comment   Glucose-Capillary 224 (*) 70 - 99 (mg/dL)     Dg Chest 2 View  10/21/8655  *RADIOLOGY REPORT*  Clinical Data: Fever  CHEST - 2 VIEW  Comparison: 01/19/2012  Findings: Cardiomegaly again noted.  Right IJ Port-A-Cath is unchanged in position.  Bony thorax is stable.  No acute infiltrate or pulmonary edema.  Stable mild elevation of the right hemidiaphragm with right basilar atelectasis.  IMPRESSION: No active disease.  No significant change.  Original Report Authenticated By: Natasha Mead, M.D.   US Abdomen Complete  01/31/2012  *RADIOLOGY REPORT*  Clinical Data:   cholelithiasis, fever, abdominal pain  COMPLETE ABDOMINAL ULTRASOUND  Comparison:  01/23/2012  Findings:  Gallbladder:  Again noted multiple mobile gallstones within gallbladder.  The largest measures about 6.5 mm.  No thickening of gallbladder wall.  No sonographic Murphy's sign.  Common bile duct:  Measures 4 mm in diameter within normal limits.  Liver:  No focal lesion identified. Again noted diffuse increased echogenicity of the liver consistent with fatty infiltration.  No intrahepatic biliary ductal dilatation.  IVC:  Appears normal.  Pancreas:  No focal abnormality seen.  Spleen:  Measures 7.3 cm in length.  Normal echogenicity.  Right Kidney:  Measures 13 cm in length.  No mass, hydronephrosis or diagnostic renal calculus  Left Kidney:  Measures 13.1 cm in length.   No mass, hydronephrosis or diagnostic renal calculus  Abdominal aorta:  No aneurysm identified. Measures up to 1.8 cm in diameter.  Small amount of free fluid noted in Morison's pouch.  IMPRESSION:  1.  Again noted multiple mobile gallstones within gallbladder.  No thickening of the gallbladder wall or sonographic Murphy's sign. 2.  Normal CBD. 3.  No hydronephrosis or diagnostic renal calculus. 4.  Again noted fatty infiltration of the liver.  Original Report Authenticated By: Natasha Mead, M.D.    Review of Systems  Constitutional: Positive for fever, chills and malaise/fatigue.  HENT: Negative.   Eyes: Negative.   Respiratory: Negative.   Cardiovascular: Negative.   Gastrointestinal: Positive for nausea, vomiting and abdominal pain.  Genitourinary: Negative.   Musculoskeletal: Positive for myalgias.  Skin: Negative.   Neurological: Negative.   Endo/Heme/Allergies: Bruises/bleeds easily.  Psychiatric/Behavioral: Negative.    Blood pressure 133/72, pulse 138, temperature 98.4 F (36.9 C), temperature source Oral, resp. rate 18, height 5\' 2"  (1.575 m), weight 284 lb 13.4 oz (129.2 kg), last menstrual period 12/29/2011, SpO2 93.00%. Physical Exam  Constitutional: She is oriented to person, place, and time.       Eating lunch.  HENT:  Head: Normocephalic and atraumatic.       Piercing of lip noted  Eyes: EOM are normal. Pupils are equal, round, and reactive to light.  Neck: Normal range of motion. Neck supple.  Cardiovascular: Normal rate and regular rhythm.   Respiratory: Effort normal and breath sounds normal.  GI: There is tenderness in the right upper quadrant. There is positive Murphy's sign.    Musculoskeletal: Normal range of motion.  Neurological: She is alert and oriented to person, place, and time.  Skin: Skin is warm and dry.  Psychiatric: She has a normal mood and affect. Her behavior is normal. Judgment and thought content normal.    Assessment/Plan: Acute  cholecystitis History of leukemia apparently in remission Multiple medical problems Clear  liquids only. NPO for OR Monday.   On unasyn Will need lap chole per Dr Lindie Spruce in next 24 - 48 hours.   Dw pt and family.  Melissa Washington A. 01/31/2012, 1:09 PM

## 2012-01-31 NOTE — Progress Notes (Signed)
Subjective: No acute events overnight.  Patient complains of worsening RUQ pain and persistent nausea and vomiting.  Currently, she is sitting up comfortably and about to eat breakfast.  She endorses chills and believes she has a fever because she feels warm.  She also complains of constipation (has not had a BM since yesterday), but denies any bloody stool or emesis.  Objective: Vital signs in last 24 hours: Temp:  [98.7 F (37.1 C)-103.1 F (39.5 C)] 98.7 F (37.1 C) (05/12 0526) Pulse Rate:  [113-135] 116  (05/12 0526) Resp:  [17-22] 17  (05/12 0526) BP: (116-138)/(57-86) 119/65 mmHg (05/12 0526) SpO2:  [96 %-99 %] 99 % (05/12 0526) Weight:  [284 lb 13.4 oz (129.2 kg)] 284 lb 13.4 oz (129.2 kg) (05/12 0039) Weight change:     Intake/Output from previous day: 05/11 0701 - 05/12 0700 In: 1000 [I.V.:1000] Out: -  Intake/Output this shift:   General appearance: alert, cooperative and no distress Head: Normocephalic, without obvious abnormality, atraumatic Resp: clear to auscultation bilaterally, diminished BS diffusely due to body habitus Cardio: tachycardic, no murmur, click, rub or gallop GI: soft; bowel sounds normal; tenderness RUQ Extremities: extremities normal, atraumatic, no cyanosis or edema Skin: Skin color, texture, turgor normal. No rashes or lesions  Lab Results:  Basename 01/31/12 0355 01/30/12 1505  WBC 20.2* 14.4*  HGB 10.2* 10.5*  HCT 28.9* 29.8*  PLT 352 392   BMET  Basename 01/31/12 0355 01/30/12 2019  NA 137 136  K 3.3* 3.4*  CL 101 104  CO2 18* 18*  GLUCOSE 225* 206*  BUN 5* 8  CREATININE 0.57 0.55  CALCIUM 9.3 9.1    Studies/Results: Dg Chest 2 View  01/30/2012  *RADIOLOGY REPORT*  Clinical Data: Fever  CHEST - 2 VIEW  Comparison: 01/19/2012  Findings: Cardiomegaly again noted.  Right IJ Port-A-Cath is unchanged in position.  Bony thorax is stable.  No acute infiltrate or pulmonary edema.  Stable mild elevation of the right hemidiaphragm with  right basilar atelectasis.  IMPRESSION: No active disease.  No significant change.  Original Report Authenticated By: Natasha Mead, M.D.   US Abdomen Complete  01/31/2012  *RADIOLOGY REPORT*  Clinical Data:   cholelithiasis, fever, abdominal pain  COMPLETE ABDOMINAL ULTRASOUND  Comparison:  01/23/2012  Findings:  Gallbladder:  Again noted multiple mobile gallstones within gallbladder.  The largest measures about 6.5 mm.  No thickening of gallbladder wall.  No sonographic Murphy's sign.  Common bile duct:  Measures 4 mm in diameter within normal limits.  Liver:  No focal lesion identified. Again noted diffuse increased echogenicity of the liver consistent with fatty infiltration.  No intrahepatic biliary ductal dilatation.  IVC:  Appears normal.  Pancreas:  No focal abnormality seen.  Spleen:  Measures 7.3 cm in length.  Normal echogenicity.  Right Kidney:  Measures 13 cm in length.  No mass, hydronephrosis or diagnostic renal calculus  Left Kidney:  Measures 13.1 cm in length.  No mass, hydronephrosis or diagnostic renal calculus  Abdominal aorta:  No aneurysm identified. Measures up to 1.8 cm in diameter.  Small amount of free fluid noted in Morison's pouch.  IMPRESSION:  1.  Again noted multiple mobile gallstones within gallbladder.  No thickening of the gallbladder wall or sonographic Murphy's sign. 2.  Normal CBD. 3.  No hydronephrosis or diagnostic renal calculus. 4.  Again noted fatty infiltration of the liver.  Original Report Authenticated By: Natasha Mead, M.D.    Medications: I have reviewed the patient's current  medications.  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  CXR today showing no active disease or significant change; no edema or acute infiltrate.  UA with rare bacteria, trace leukocytes, >1000 glucose.  Abdominal U/S revealed multiple gallstones, but no cholecystitis. -  Blood and urine cultures sent from ED  - Tylenol (and toradol) PRN fever  - Given broad nature of symptoms, this is most likely due to a viral process; patient is not neutropenic, however, will continue Unasyn to cover for possible abdominal etiology (pt is a diabetic with known gallstones)   # Abdominal pain, nausea, vomiting: has had multiple admissions for this over the past month.  DDx includes residual pleuritic pain from PNA vs cholelithiasis vs viral process. - For pain, will start Oxycodone 10 q 4PRN since this controlled patient's pain at home.  Continue Toradol for pain and fever - For nausea, will add Phenergan PRN - Abdominal U/S revealed multiple stones but no inflammation - consider Surgery consult in AM if patient's symptoms do not improve - Check lipase today  # Gap acidosis: likely due to DKA; lactic acid 3.3.  Initial gap (with corrected sodium) was 23; now improved to 14 s/p IVF and insulin. 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

## 2012-01-31 NOTE — Progress Notes (Addendum)
Patient ID: Melissa Washington, female   DOB: [birth year], 26 y.o.   MRN: [id number]  Discussed case with Dr. Rocky Link (CCM) due to patient's decline in clinical symptoms: fever 103, HR 130, and RR 30-40.  CCM physician will see patient today.  He recommended calling surgery to discuss perc drainage rather than cholecystectomy due to patient's worsening clinical symptoms.  I will call Surgery for further recommendations.  Thank you for your consult.  DE LA CRUZ,Nivaan Dicenzo 01/31/12 5:20 PM

## 2012-01-31 NOTE — Consult Note (Signed)
Name: Melissa Washington MRN: [id number] DOB: 1986/07/18  DOS:  01/31/12    CRITICAL CARE MEDICINE ADMISSION / CONSULTATION NOTE  Referring Physician : Dr Sheffield Slider  Reason For Consult : Post operative patient.  History Of Present Illness : The patient is a 26 year old female with leukemia and DM who was admitted yesterday with DKA. During the hospital stay, she complained of RUQ pain, nausea, vomiting and was febrile with leucocytosis. She had USG abdomen which showed cholelithiasis. She was diagnosed with cholecystitis and had lap cholecystectomy 2 hours back.  Pt is currently S/P cholecystectomy (POD #0), IV cholangiogram is negative for obstruction. She is lying comfortably on bed and denies any abdominal pain, chest pain, fever, chills or rigors. Denies any SOB.  Past Medical History  Diagnosis Date  . Diabetes mellitus   . Hypertension   . Nephrolithiasis   . 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 Hepatic steatosis 01/17/2012  . Cholelithiasis 01/17/2012    Past Surgical History  Procedure Date  . Portacath placement     [facility]    Prior to Admission medications   Medication Sig Start Date End Date Taking? Authorizing Provider  ibuprofen (ADVIL) 800 MG tablet Take 1 tablet (800 mg total) by mouth every 8 (eight) hours as needed for pain. 01/21/12 01/31/12 Yes Christina P Rama, MD  lisinopril (PRINIVIL,ZESTRIL) 10 MG tablet Take 1 tablet (10 mg total) by mouth daily. 01/15/12 01/14/13 Yes Pierce Crane, MD  magnesium chloride (SLOW-MAG) 64 MG TBEC Take 2 tablets by mouth daily.   Yes Historical Provider, MD  metFORMIN (GLUCOPHAGE) 1000 MG tablet Take 1 tablet (1,000 mg total) by mouth daily with breakfast. 01/15/12 01/14/13 Yes Pierce Crane, MD  promethazine (PHENERGAN) 12.5 MG tablet Take 12.5 mg by mouth every 8 (eight) hours as needed. For nausea   Yes Historical Provider, MD  tretinoin (VESANOID) 10 MG capsule Take 50 mg by mouth 2 (two) times daily.   Yes Historical  Provider, MD    Allergies  Allergen Reactions  . Ultram (Tramadol Hcl) Nausea And Vomiting    Family History  Problem Relation Age of Onset  . Kidney disease Mother   . Kidney disease Maternal Grandmother   . Diabetes Maternal Grandmother   . Hypertension Mother   . Diabetes Father     Social History  reports that she has never smoked. She does not have any smokeless tobacco history on file. She reports that she does not drink alcohol or use illicit drugs.  Review Of Systems  11 points review of systems is negative with an exception of listed in HPI.  Physical Examination  BP 120/79  Pulse 122  Temp(Src) 97.9 F (36.6 C) (Oral)  Resp 35  Ht 5\' 2"  (1.575 m)  Wt 129.2 kg (284 lb 13.4 oz)  BMI 52.10 kg/m2  SpO2 100%  LMP 12/29/2011  Neuro:  AAOX3 HEENT:  WNL Heart:  RRR, no M/R/G Lungs:  Bilateral air entry, no W/R/R Abdomen:  Soft, non tender, bowel sounds present. Lap incision + Extremities:  No cyanosis, clubbing or edema.  Labs  Results for orders placed during the hospital encounter of 01/30/12 (from the past 24 hour(s))  GLUCOSE, CAPILLARY     Status: Abnormal   Collection Time   01/31/12 12:49 AM      Component Value Range   Glucose-Capillary 195 (*) 70 - 99 (mg/dL)  BASIC METABOLIC PANEL     Status: Abnormal   Collection Time  01/31/12  3:55 AM      Component Value Range   Sodium 137  135 - 145 (mEq/L)   Potassium 3.3 (*) 3.5 - 5.1 (mEq/L)   Chloride 101  96 - 112 (mEq/L)   CO2 18 (*) 19 - 32 (mEq/L)   Glucose, Bld 225 (*) 70 - 99 (mg/dL)   BUN 5 (*) 6 - 23 (mg/dL)   Creatinine, Ser 9.60  0.50 - 1.10 (mg/dL)   Calcium 9.3  8.4 - 45.4 (mg/dL)   GFR calc non Af Amer >90  >90 (mL/min)   GFR calc Af Amer >90  >90 (mL/min)  CBC     Status: Abnormal   Collection Time   01/31/12  3:55 AM      Component Value Range   WBC 20.2 (*) 4.0 - 10.5 (K/uL)   RBC 3.39 (*) 3.87 - 5.11 (MIL/uL)   Hemoglobin 10.2 (*) 12.0 - 15.0 (g/dL)   HCT 09.8 (*) 11.9 - 46.0  (%)   MCV 85.3  78.0 - 100.0 (fL)   MCH 30.1  26.0 - 34.0 (pg)   MCHC 35.3  30.0 - 36.0 (g/dL)   RDW 14.7  82.9 - 56.2 (%)   Platelets 352  150 - 400 (K/uL)  GLUCOSE, CAPILLARY     Status: Abnormal   Collection Time   01/31/12  4:11 AM      Component Value Range   Glucose-Capillary 213 (*) 70 - 99 (mg/dL)  GLUCOSE, CAPILLARY     Status: Abnormal   Collection Time   01/31/12 10:51 AM      Component Value Range   Glucose-Capillary 222 (*) 70 - 99 (mg/dL)  LIPASE, BLOOD     Status: Normal   Collection Time   01/31/12 11:00 AM      Component Value Range   Lipase 53  11 - 59 (U/L)  GLUCOSE, CAPILLARY     Status: Abnormal   Collection Time   01/31/12 11:41 AM      Component Value Range   Glucose-Capillary 224 (*) 70 - 99 (mg/dL)  PROTIME-INR     Status: Abnormal   Collection Time   01/31/12  2:00 PM      Component Value Range   Prothrombin Time 17.7 (*) 11.6 - 15.2 (seconds)   INR 1.43  0.00 - 1.49   APTT     Status: Normal   Collection Time   01/31/12  2:00 PM      Component Value Range   aPTT 35  24 - 37 (seconds)  MRSA PCR SCREENING     Status: Normal   Collection Time   01/31/12  2:16 PM      Component Value Range   MRSA by PCR NEGATIVE  NEGATIVE   GLUCOSE, CAPILLARY     Status: Abnormal   Collection Time   01/31/12  3:39 PM      Component Value Range   Glucose-Capillary 270 (*) 70 - 99 (mg/dL)   Comment 1 Documented in Chart     Comment 2 Notify RN    GLUCOSE, CAPILLARY     Status: Abnormal   Collection Time   01/31/12  8:58 PM      Component Value Range   Glucose-Capillary 232 (*) 70 - 99 (mg/dL)   Comment 1 Call MD NNP PA CNM      Imaging  Dg Chest 2 View  01/30/2012  *RADIOLOGY REPORT*  Clinical Data: Fever  CHEST - 2 VIEW  Comparison: 01/19/2012  Findings: Cardiomegaly again noted.  Right IJ Port-A-Cath is unchanged in position.  Bony thorax is stable.  No acute infiltrate or pulmonary edema.  Stable mild elevation of the right hemidiaphragm with right basilar  atelectasis.  IMPRESSION: No active disease.  No significant change.  Original Report Authenticated By: Natasha Mead, M.D.   Dg Cholangiogram Operative  01/31/2012  *RADIOLOGY REPORT*  Clinical Data: Laparoscopic cholecystectomy.  Fluoro time 22.9 seconds.  INTRAOPERATIVE CHOLANGIOGRAM  Technique: Spot fluoroscopic images of the right upper quadrant are obtained intraoperatively during injection of contrast material into the biliary system.  Comparison:  None.  Findings: Extrahepatic bile ducts demonstrate normal caliber with free flow of contrast material to the duodenum.  No intraluminal filling defects are demonstrated.  No evidence of common duct stone or contrast extravasation.  Limited visualization of intrahepatic bile ducts with predominant right-sided ducts visualized. Visualized intrahepatic bile ducts are normal in caliber.  IMPRESSION: No evidence of biliary obstruction, bile duct stone, or contrast extravasation.  Original Report Authenticated By: Marlon Pel, M.D.   US Abdomen Complete  01/31/2012  *RADIOLOGY REPORT*  Clinical Data:   cholelithiasis, fever, abdominal pain  COMPLETE ABDOMINAL ULTRASOUND  Comparison:  01/23/2012  Findings:  Gallbladder:  Again noted multiple mobile gallstones within gallbladder.  The largest measures about 6.5 mm.  No thickening of gallbladder wall.  No sonographic Murphy's sign.  Common bile duct:  Measures 4 mm in diameter within normal limits.  Liver:  No focal lesion identified. Again noted diffuse increased echogenicity of the liver consistent with fatty infiltration.  No intrahepatic biliary ductal dilatation.  IVC:  Appears normal.  Pancreas:  No focal abnormality seen.  Spleen:  Measures 7.3 cm in length.  Normal echogenicity.  Right Kidney:  Measures 13 cm in length.  No mass, hydronephrosis or diagnostic renal calculus  Left Kidney:  Measures 13.1 cm in length.  No mass, hydronephrosis or diagnostic renal calculus  Abdominal aorta:  No aneurysm  identified. Measures up to 1.8 cm in diameter.  Small amount of free fluid noted in Morison's pouch.  IMPRESSION:  1.  Again noted multiple mobile gallstones within gallbladder.  No thickening of the gallbladder wall or sonographic Murphy's sign. 2.  Normal CBD. 3.  No hydronephrosis or diagnostic renal calculus. 4.  Again noted fatty infiltration of the liver.  Original Report Authenticated By: Natasha Mead, M.D.    Assessment The patient is a 25/F, S/P lap cholecystectomy.  1. S/P laparoscopic cholecystectomy. - Intraoperative cholangiogram negative for obstruction in the bile duct. - Cont. To follow surgery recs. - Currently patient appears comfortable. - Pain meds, GI/DVT prophylaxis.  2. DM - Pt is currently on sliding scale. - Would recommend insulin drip, her anion gap is still 19 ( as per the previous lab). Her CBG is consistently above 200. She was admitted with DKA yesterday! - In the setting of current stress (surgery), blood glucose might increase. - Would initiate insulin drip.  3. SIRS: - Pt was febrile with leucocytosis. - Cholecystitis ??, no S/P cholecystectomy; ?? UTI (although culture negative) - Blood culture so far negative. - Cont. zosyn for now.  4. Leukemia: - Management as per oncology.   Catha Brow, MD 01/31/2012, 10:38 PM

## 2012-01-31 NOTE — Anesthesia Procedure Notes (Signed)
Procedure Name: Intubation Date/Time: 01/31/2012 7:18 PM Performed by: Molli Hazard Pre-anesthesia Checklist: Patient identified, Emergency Drugs available, Suction available and Patient being monitored Patient Re-evaluated:Patient Re-evaluated prior to inductionOxygen Delivery Method: Circle system utilized Preoxygenation: Pre-oxygenation with 100% oxygen Intubation Type: IV induction, Rapid sequence and Cricoid Pressure applied Laryngoscope Size: Miller and 2 Grade View: Grade I Tube type: Oral Tube size: 7.5 mm Number of attempts: 1 Airway Equipment and Method: Stylet Placement Confirmation: ETT inserted through vocal cords under direct vision,  positive ETCO2 and breath sounds checked- equal and bilateral Secured at: 21 cm Tube secured with: Tape Dental Injury: Teeth and Oropharynx as per pre-operative assessment

## 2012-01-31 NOTE — Anesthesia Preprocedure Evaluation (Signed)
Anesthesia Evaluation  Patient identified by MRN, date of birth, ID band Patient awake    Reviewed: Allergy & Precautions, H&P , NPO status , Patient's Chart, lab work & pertinent test results  History of Anesthesia Complications Negative for: history of anesthetic complications  Airway Mallampati: I TM Distance: >3 FB Neck ROM: Full    Dental  (+) Teeth Intact and Dental Advisory Given   Pulmonary   RLL distant        Cardiovascular hypertension, Rhythm:Regular     Neuro/Psych    GI/Hepatic   Endo/Other  Diabetes mellitus-, Type 2, Oral Hypoglycemic AgentsMorbid obesity  Renal/GU      Musculoskeletal   Abdominal   Peds  Hematology   Anesthesia Other Findings   Reproductive/Obstetrics                           Anesthesia Physical Anesthesia Plan  ASA: III and Emergent  Anesthesia Plan: General   Post-op Pain Management:    Induction: Intravenous, Rapid sequence and Cricoid pressure planned  Airway Management Planned: Oral ETT  Additional Equipment:   Intra-op Plan:   Post-operative Plan: Extubation in OR  Informed Consent:   Dental advisory given  Plan Discussed with: Anesthesiologist and Surgeon  Anesthesia Plan Comments:         Anesthesia Quick Evaluation

## 2012-01-31 NOTE — Interval H&P Note (Signed)
History and Physical Interval Note:  01/31/2012 7:13 PM  Melissa Washington  has presented today for surgery, with the diagnosis of acute cholecystitis  The various methods of treatment have been discussed with the patient and family. After consideration of risks, benefits and other options for treatment, the patient has consented to  Procedure(s) (LRB): LAPAROSCOPIC CHOLECYSTECTOMY WITH INTRAOPERATIVE CHOLANGIOGRAM (N/A) as a surgical intervention .  The patients' history has been reviewed, patient examined, no change in status, stable for surgery.  I have reviewed the patients' chart and labs.  Questions were answered to the patient's satisfaction.     [name]

## 2012-01-31 NOTE — Preoperative (Signed)
Beta Blockers   Reason not to administer Beta Blockers:Not Applicable 

## 2012-01-31 NOTE — ED Notes (Signed)
Called report to [place] on floor prior to transport.

## 2012-01-31 NOTE — Progress Notes (Signed)
ANTIBIOTIC CONSULT NOTE - INITIAL  Pharmacy Consult for Zosyn Indication: GNR in blood  Assessment: 44 yoF admitted with fever, myalgias, found to be in DKA with gap acidosis and developed RUQ pain with N/V. Being started on Zosyn for bacteremia (preliminary GNR x 1).  Also received 1g rocephin in the ED and 2 doses of Unasyn since admit. Tmax 103.9'F, WBC 20.2; Wt 129.2kg, Blood and Urine cx pending; Scr 0.57 (CrCl > 100).  Goal of Therapy:  Clinical improvement  Plan:  1. Start Zosyn 3.375g IV Q8hrs 2. Follow-up cultures, renal function, clinical improvement  Benjaman Pott, PharmD     Pager [phone] 01/31/2012   11:46 AM  ----------------------------------------------------  Allergies  Allergen Reactions  . Ultram (Tramadol Hcl) Nausea And Vomiting    Patient Measurements: Height: 5\' 2"  (157.5 cm) Weight: 284 lb 13.4 oz (129.2 kg) IBW/kg (Calculated) : 50.1   Vital Signs: Temp: 103.9 F (39.9 C) (05/12 1047) Temp src: Oral (05/12 1047) BP: 151/89 mmHg (05/12 1047) Pulse Rate: 121  (05/12 1047) Intake/Output from previous day: 05/11 0701 - 05/12 0700 In: 1000 [I.V.:1000] Out: -  Intake/Output from this shift:    Labs:  Basename 01/31/12 0355 01/30/12 2019 01/30/12 1842 01/30/12 1505  WBC 20.2* -- -- 14.4*  HGB 10.2* -- -- 10.5*  PLT 352 -- -- 392  LABCREA -- -- -- --  CREATININE 0.57 0.55 0.66 --   Estimated Creatinine Clearance: 141.1 ml/min (by C-G formula based on Cr of 0.57). No results found for this basename: VANCOTROUGH:2,VANCOPEAK:2,VANCORANDOM:2,GENTTROUGH:2,GENTPEAK:2,GENTRANDOM:2,TOBRATROUGH:2,TOBRAPEAK:2,TOBRARND:2,AMIKACINPEAK:2,AMIKACINTROU:2,AMIKACIN:2, in the last 72 hours   Microbiology: Recent Results (from the past 720 hour(s))  URINE CULTURE     Status: Normal   Collection Time   01/17/12  3:52 AM      Component Value Range Status Comment   Specimen Description URINE, CLEAN CATCH   Final    Special Requests NONE   Final    Culture   Setup Time [id number]   Final    Colony Count >=100,000 COLONIES/ML   Final    Culture KLEBSIELLA PNEUMONIAE   Final    Report Status 01/19/2012 FINAL   Final    Organism ID, Bacteria KLEBSIELLA PNEUMONIAE   Final   CULTURE, BLOOD (ROUTINE X 2)     Status: Normal   Collection Time   01/17/12  4:26 PM      Component Value Range Status Comment   Specimen Description BLOOD LEFT ARM   Final    Special Requests BOTTLES DRAWN AEROBIC ONLY 1CC   Final    Culture  Setup Time [id number]   Final    Culture NO GROWTH 5 DAYS   Final    Report Status 01/23/2012 FINAL   Final   CULTURE, BLOOD (ROUTINE X 2)     Status: Normal   Collection Time   01/17/12  4:50 PM      Component Value Range Status Comment   Specimen Description BLOOD LEFT HAND   Final    Special Requests BOTTLES DRAWN AEROBIC ONLY [facility]   Final    Culture  Setup Time [id number]   Final    Culture NO GROWTH 5 DAYS   Final    Report Status 01/23/2012 FINAL   Final   URINE CULTURE     Status: Normal   Collection Time   01/23/12  8:38 PM      Component Value Range Status Comment   Specimen Description URINE, CLEAN CATCH   Final    Special Requests Immunocompromised  Final    Culture  Setup Time [id number]   Final    Colony Count 25,000 COLONIES/ML   Final    Culture     Final    Value: Multiple bacterial morphotypes present, none predominant. Suggest appropriate recollection if clinically indicated.   Report Status 01/25/2012 FINAL   Final   CULTURE, BLOOD (ROUTINE X 2)     Status: Normal (Preliminary result)   Collection Time   01/30/12  2:51 PM      Component Value Range Status Comment   Specimen Description BLOOD RIGHT ARM   Final    Special Requests BOTTLES DRAWN AEROBIC AND ANAEROBIC 10CC   Final    Culture  Setup Time [id number]   Final    Culture     Final    Value: GRAM NEGATIVE RODS     Note: Gram Stain Report Called to,Read Back By and Verified With: RN A. COLE ON 01/31/12 AT 1110 BY DTERRY   Report Status  PENDING   Incomplete   CULTURE, BLOOD (ROUTINE X 2)     Status: Normal (Preliminary result)   Collection Time   01/30/12  2:58 PM      Component Value Range Status Comment   Specimen Description BLOOD LEFT ARM   Final    Special Requests BOTTLES DRAWN AEROBIC AND ANAEROBIC 10CC   Final    Culture  Setup Time [id number]   Final    Culture     Final    Value:        BLOOD CULTURE RECEIVED NO GROWTH TO DATE CULTURE WILL BE HELD FOR 5 DAYS BEFORE ISSUING A FINAL NEGATIVE REPORT   Report Status PENDING   Incomplete     Medical History: Past Medical History  Diagnosis Date  . Diabetes mellitus   . Hypertension   . Nephrolithiasis   . 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 Hepatic steatosis 01/17/2012  . Cholelithiasis 01/17/2012

## 2012-02-01 ENCOUNTER — Ambulatory Visit: Payer: Self-pay

## 2012-02-01 ENCOUNTER — Other Ambulatory Visit: Payer: Self-pay | Admitting: Lab

## 2012-02-01 ENCOUNTER — Ambulatory Visit: Payer: Self-pay | Admitting: Physician Assistant

## 2012-02-01 ENCOUNTER — Encounter (HOSPITAL_COMMUNITY): Payer: Self-pay | Admitting: Surgery

## 2012-02-01 DIAGNOSIS — E1110 Type 2 diabetes mellitus with ketoacidosis without coma: Secondary | ICD-10-CM

## 2012-02-01 DIAGNOSIS — C9200 Acute myeloblastic leukemia, not having achieved remission: Secondary | ICD-10-CM

## 2012-02-01 LAB — COMPREHENSIVE METABOLIC PANEL
ALT: 35 U/L (ref 0–35)
Alkaline Phosphatase: 101 U/L (ref 39–117)
CO2: 19 mEq/L (ref 19–32)
Calcium: 9.1 mg/dL (ref 8.4–10.5)
Chloride: 104 mEq/L (ref 96–112)
GFR calc Af Amer: >90 mL/min (ref >90)
GFR calc non Af Amer: >90 mL/min (ref >90)
Glucose, Bld: 165 mg/dL — ABNORMAL HIGH (ref 70–99)
Potassium: 3.8 mEq/L (ref 3.5–5.1)
Sodium: 136 mEq/L (ref 135–145)
Total Bilirubin: 1 mg/dL (ref 0.3–1.2)

## 2012-02-01 LAB — GLUCOSE, CAPILLARY
Glucose-Capillary: 151 mg/dL — ABNORMAL HIGH (ref 70–99)
Glucose-Capillary: 153 mg/dL — ABNORMAL HIGH (ref 70–99)
Glucose-Capillary: 156 mg/dL — ABNORMAL HIGH (ref 70–99)
Glucose-Capillary: 163 mg/dL — ABNORMAL HIGH (ref 70–99)
Glucose-Capillary: 167 mg/dL — ABNORMAL HIGH (ref 70–99)
Glucose-Capillary: 175 mg/dL — ABNORMAL HIGH (ref 70–99)
Glucose-Capillary: 180 mg/dL — ABNORMAL HIGH (ref 70–99)
Glucose-Capillary: 182 mg/dL — ABNORMAL HIGH (ref 70–99)
Glucose-Capillary: 235 mg/dL — ABNORMAL HIGH (ref 70–99)

## 2012-02-01 LAB — CBC
HCT: 27.4 % — ABNORMAL LOW (ref 36.0–46.0)
Hemoglobin: 9 g/dL — ABNORMAL LOW (ref 12.0–15.0)
MCH: 29.8 pg (ref 26.0–34.0)
MCH: 29.9 pg (ref 26.0–34.0)
MCHC: 34.3 g/dL (ref 30.0–36.0)
MCV: 87 fL (ref 78.0–100.0)
Platelets: 291 10*3/uL (ref 150–400)
RBC: 3.01 MIL/uL — ABNORMAL LOW (ref 3.87–5.11)
RDW: 15.5 % (ref 11.5–15.5)

## 2012-02-01 LAB — HEPATIC FUNCTION PANEL
ALT: 40 U/L — ABNORMAL HIGH (ref 0–35)
AST: 45 U/L — ABNORMAL HIGH (ref 0–37)
Bilirubin, Direct: 0.6 mg/dL — ABNORMAL HIGH (ref 0.0–0.3)
Total Bilirubin: 1.5 mg/dL — ABNORMAL HIGH (ref 0.3–1.2)

## 2012-02-01 LAB — BASIC METABOLIC PANEL
BUN: 7 mg/dL (ref 6–23)
BUN: 6 mg/dL (ref 6–23)
BUN: 4 mg/dL — ABNORMAL LOW (ref 6–23)
CO2: 19 mEq/L (ref 19–32)
CO2: 18 mEq/L — ABNORMAL LOW (ref 19–32)
CO2: 18 mEq/L — ABNORMAL LOW (ref 19–32)
CO2: 20 mEq/L (ref 19–32)
Calcium: 9 mg/dL (ref 8.4–10.5)
Chloride: 103 mEq/L (ref 96–112)
Chloride: 104 mEq/L (ref 96–112)
Chloride: 102 mEq/L (ref 96–112)
Chloride: 102 mEq/L (ref 96–112)
Chloride: 105 mEq/L (ref 96–112)
Creatinine, Ser: 0.68 mg/dL (ref 0.50–1.10)
Creatinine, Ser: 0.62 mg/dL (ref 0.50–1.10)
Creatinine, Ser: 0.64 mg/dL (ref 0.50–1.10)
Glucose, Bld: 213 mg/dL — ABNORMAL HIGH (ref 70–99)
Glucose, Bld: 173 mg/dL — ABNORMAL HIGH (ref 70–99)
Glucose, Bld: 141 mg/dL — ABNORMAL HIGH (ref 70–99)
Glucose, Bld: 187 mg/dL — ABNORMAL HIGH (ref 70–99)
Glucose, Bld: 189 mg/dL — ABNORMAL HIGH (ref 70–99)
Potassium: 3.9 mEq/L (ref 3.5–5.1)
Potassium: 3.6 mEq/L (ref 3.5–5.1)
Potassium: 3.4 mEq/L — ABNORMAL LOW (ref 3.5–5.1)
Sodium: 135 mEq/L (ref 135–145)

## 2012-02-01 LAB — PHOSPHORUS: Phosphorus: 2.1 mg/dL — ABNORMAL LOW (ref 2.3–4.6)

## 2012-02-01 MED ORDER — SODIUM CHLORIDE 0.9 % IV BOLUS (SEPSIS)
1000.0000 mL | Freq: Once | INTRAVENOUS | Status: AC
  Administered 2012-02-01: 1000 mL via INTRAVENOUS

## 2012-02-01 MED ORDER — ACETAMINOPHEN 650 MG RE SUPP: 650.0000 mg | Freq: Four times a day (QID) | RECTAL | Status: DC | PRN

## 2012-02-01 MED ORDER — HYDROMORPHONE HCL PF 1 MG/ML IJ SOLN
1.0000 mg | INTRAMUSCULAR | Status: DC | PRN
  Administered 2012-02-01 – 2012-02-02 (×3): 1 mg via INTRAVENOUS
  Filled 2012-02-01 (×3): qty 1

## 2012-02-01 MED ORDER — MAGNESIUM SULFATE 40 MG/ML IJ SOLN
2.0000 g | Freq: Once | INTRAMUSCULAR | Status: AC
  Administered 2012-02-01: 2 g via INTRAVENOUS
  Filled 2012-02-01: qty 50

## 2012-02-01 MED ORDER — SODIUM CHLORIDE 0.9 % IV BOLUS (SEPSIS): 1000.0000 mL | Freq: Once | INTRAVENOUS | Status: DC

## 2012-02-01 MED ORDER — SODIUM CHLORIDE 0.9 % IV SOLN
500.0000 mg | Freq: Four times a day (QID) | INTRAVENOUS | Status: DC
  Administered 2012-02-01 – 2012-02-02 (×2): 500 mg via INTRAVENOUS
  Filled 2012-02-01 (×6): qty 500

## 2012-02-01 MED ORDER — CEFOXITIN SODIUM 1 G IV SOLR: 1.0000 g | INTRAVENOUS | Status: DC

## 2012-02-01 MED ORDER — ACETAMINOPHEN 500 MG PO TABS
1000.0000 mg | ORAL_TABLET | Freq: Four times a day (QID) | ORAL | Status: DC | PRN
  Administered 2012-02-01 – 2012-02-02 (×2): 1000 mg via ORAL
  Filled 2012-02-01 (×3): qty 2

## 2012-02-01 MED ORDER — ACETAMINOPHEN 325 MG PO TABS
325.0000 mg | ORAL_TABLET | Freq: Once | ORAL | Status: AC
  Administered 2012-02-01: 325 mg via ORAL
  Filled 2012-02-01: qty 1

## 2012-02-01 MED ORDER — SODIUM CHLORIDE 0.9 % IJ SOLN
INTRAMUSCULAR | Status: AC
  Administered 2012-02-02: 10 mL
  Filled 2012-02-01: qty 20

## 2012-02-01 MED ORDER — KCL IN DEXTROSE-NACL 20-5-0.45 MEQ/L-%-% IV SOLN
INTRAVENOUS | Status: DC
  Administered 2012-02-01: 21:00:00 via INTRAVENOUS
  Administered 2012-02-01: 100 mL/h via INTRAVENOUS
  Filled 2012-02-01 (×4): qty 1000

## 2012-02-01 NOTE — Consult Note (Signed)
Name: Melissa Washington MRN: [id number] DOB: 1986-03-21  DOS:  01/31/12    CRITICAL CARE MEDICINE ADMISSION / CONSULTATION NOTE  Referring Physician : Dr Sheffield Slider  Reason For Consult : Post operative patient.  History Of Present Illness : The patient is a 26 year old female with leukemia and DM who was admitted yesterday with DKA. During the hospital stay, she complained of RUQ pain, nausea, vomiting and was febrile with leucocytosis. She had USG abdomen which showed cholelithiasis. She was diagnosed with cholecystitis and had lap cholecystectomy 2 hours back.  Pt is currently S/P cholecystectomy (POD #1), IV cholangiogram is negative for obstruction.   Subjective:  Up in chair Insulin gtt running, last AG 13 (at 4:00)  Physical Examination  BP 115/63  Pulse 137  Temp(Src) 99.9 F (37.7 C) (Oral)  Resp 27  Ht 5\' 2"  (1.575 m)  Wt 129.2 kg (284 lb 13.4 oz)  BMI 52.10 kg/m2  SpO2 96%  LMP 12/29/2011  Neuro:  AAOX3 HEENT:  WNL Heart:  RRR, no M/R/G Lungs:  Bilateral air entry, no W/R/R Abdomen:  Soft, non tender, bowel sounds present. Lap incision + Extremities:  No cyanosis, clubbing or edema.  Labs BMET    Component Value Date/Time   NA 136 02/01/2012 0406   NA 136 02/01/2012 0406   K 3.8 02/01/2012 0406   K 3.6 02/01/2012 0406   CL 104 02/01/2012 0406   CL 104 02/01/2012 0406   CO2 19 02/01/2012 0406   CO2 19 02/01/2012 0406   GLUCOSE 165* 02/01/2012 0406   GLUCOSE 163* 02/01/2012 0406   BUN 6 02/01/2012 0406   BUN 6 02/01/2012 0406   CREATININE 0.68 02/01/2012 0406   CREATININE 0.68 02/01/2012 0406   CALCIUM 9.1 02/01/2012 0406   CALCIUM 9.0 02/01/2012 0406   GFRNONAA >90 02/01/2012 0406   GFRNONAA >90 02/01/2012 0406   GFRAA >90 02/01/2012 0406   GFRAA >90 02/01/2012 0406      Imaging  Dg Chest 2 View  01/30/2012  *RADIOLOGY REPORT*  Clinical Data: Fever  CHEST - 2 VIEW  Comparison: 01/19/2012  Findings: Cardiomegaly again noted.  Right IJ Port-A-Cath is unchanged in  position.  Bony thorax is stable.  No acute infiltrate or pulmonary edema.  Stable mild elevation of the right hemidiaphragm with right basilar atelectasis.  IMPRESSION: No active disease.  No significant change.  Original Report Authenticated By: Natasha Mead, M.D.   Dg Cholangiogram Operative  01/31/2012  *RADIOLOGY REPORT*  Clinical Data: Laparoscopic cholecystectomy.  Fluoro time 22.9 seconds.  INTRAOPERATIVE CHOLANGIOGRAM  Technique: Spot fluoroscopic images of the right upper quadrant are obtained intraoperatively during injection of contrast material into the biliary system.  Comparison:  None.  Findings: Extrahepatic bile ducts demonstrate normal caliber with free flow of contrast material to the duodenum.  No intraluminal filling defects are demonstrated.  No evidence of common duct stone or contrast extravasation.  Limited visualization of intrahepatic bile ducts with predominant right-sided ducts visualized. Visualized intrahepatic bile ducts are normal in caliber.  IMPRESSION: No evidence of biliary obstruction, bile duct stone, or contrast extravasation.  Original Report Authenticated By: Marlon Pel, M.D.   US Abdomen Complete  01/31/2012  *RADIOLOGY REPORT*  Clinical Data:   cholelithiasis, fever, abdominal pain  COMPLETE ABDOMINAL ULTRASOUND  Comparison:  01/23/2012  Findings:  Gallbladder:  Again noted multiple mobile gallstones within gallbladder.  The largest measures about 6.5 mm.  No thickening of gallbladder wall.  No sonographic Murphy's sign.  Common  bile duct:  Measures 4 mm in diameter within normal limits.  Liver:  No focal lesion identified. Again noted diffuse increased echogenicity of the liver consistent with fatty infiltration.  No intrahepatic biliary ductal dilatation.  IVC:  Appears normal.  Pancreas:  No focal abnormality seen.  Spleen:  Measures 7.3 cm in length.  Normal echogenicity.  Right Kidney:  Measures 13 cm in length.  No mass, hydronephrosis or diagnostic renal  calculus  Left Kidney:  Measures 13.1 cm in length.  No mass, hydronephrosis or diagnostic renal calculus  Abdominal aorta:  No aneurysm identified. Measures up to 1.8 cm in diameter.  Small amount of free fluid noted in Morison's pouch.  IMPRESSION:  1.  Again noted multiple mobile gallstones within gallbladder.  No thickening of the gallbladder wall or sonographic Murphy's sign. 2.  Normal CBD. 3.  No hydronephrosis or diagnostic renal calculus. 4.  Again noted fatty infiltration of the liver.  Original Report Authenticated By: Natasha Mead, M.D.    Assessment The patient is a 25/F, S/P lap cholecystectomy.  1. S/P laparoscopic cholecystectomy. - Intraoperative cholangiogram negative for obstruction in the bile duct. - Cont. To follow surgery recs. - Currently patient appears comfortable. - Pain meds, GI/DVT prophylaxis.  2. DM - Agree with insulin gtt until AG closed and CO2 > 20 - stat BMP now, may be able to transition off the insulin gtt. Will defer to FPTS regarding frequency of BMP's, by clearly will need to follow serial labs  3. SIRS: - Pt was febrile with leucocytosis. - Cholecystitis ??, no S/P cholecystectomy; ?? UTI (although culture negative) - Blood culture so far negative. - Cont. zosyn for now.  4. Leukemia: - Management as per oncology.   Leslye Peer., MD 02/01/2012, 11:17 AM

## 2012-02-01 NOTE — Progress Notes (Signed)
Went up to see pt, still having fevers, most recently to 103, and tachycardia in the 130s despite Zosyn treatment for GNR.   BP is stable to elevated. Patient was discussed with Dr. Mauricio Po, attending, and decided to start Imipenem for Pseudomonal coverage as this would not be treated with Zosyn.  GNR are not speciated yet.  Will also redraw blood cultures, one of which will be from pt's central line, which was placed ~3 weeks ago.   Will give additional 1L of fluid since patient may have a component of dehydration contributing to tachycardia given that she is still in DKA and having fevers.  She has only voided x1 today.  Anion gap remains open at 14, based on most recent BMET at 1500.  Will continue insulin gtt until this resolves.  Nursing is having difficulty with maintaining IV access at this time.  Will continue giving fluids liberally.    Taiz Bickle 02/01/2012, 4:42 PM

## 2012-02-01 NOTE — Progress Notes (Signed)
Family Medicine Teaching Service Daily Resident Note   Patient name: Melissa Washington Medical record number: [id number] Date of birth: 07/09/1986 Age: 26 y.o. Gender: female Length of Stay:  LOS: 2 days             SUBJECTIVE & OVERNIGHT EVENTS  Pt reported feeling better this AM and has improvement in her abdominal pain; now only sore. No fever overnigh            OBJECTIVE  Vitals: Patient Vitals for the past 24 hrs:  BP Temp Temp src Pulse Resp SpO2  02/01/12 1200 143/106 mmHg 99.9 F (37.7 C) Oral - - -  02/01/12 0800 115/63 mmHg 99.9 F (37.7 C) Oral - - -  02/01/12 0619 - 99.3 F (37.4 C) Oral - - -  02/01/12 0349 132/75 mmHg 100.2 F (37.9 C) Oral 137  27  96 %  02/01/12 0220 - 102.1 F (38.9 C) Axillary 128  - -  02/01/12 0150 - 102.5 F (39.2 C) Oral - - -  02/01/12 0010 131/72 mmHg 100.6 F (38.1 C) Oral 123  37  95 %  01/31/12 2210 120/79 mmHg - - 122  35  100 %  01/31/12 2200 - - - 123  32  100 %  01/31/12 2156 130/85 mmHg 97.9 F (36.6 C) Oral 124  37  100 %  01/31/12 2141 140/90 mmHg 98.4 F (36.9 C) - 121  34  100 %  01/31/12 2140 140/90 mmHg - - - - -  01/31/12 2130 146/104 mmHg - - 119  36  100 %  01/31/12 2115 144/109 mmHg - - 120  37  100 %  01/31/12 2100 139/87 mmHg - - 126  33  100 %  01/31/12 2054 139/87 mmHg 97 F (36.1 C) - 126  25  100 %  01/31/12 1815 - - - 111  38  100 %   Wt Readings from Last 3 Encounters:  01/31/12 284 lb 13.4 oz (129.2 kg)  01/31/12 284 lb 13.4 oz (129.2 kg)  01/24/12 274 lb 14.6 oz (124.7 kg)    Intake/Output Summary (Last 24 hours) at 02/01/12 1718 Last data filed at 02/01/12 1347  Gross per 24 hour  Intake 2165.5 ml  Output   2500 ml  Net -334.5 ml    PE: GENERAL:  Obese AA female.  Examined in [facility] 3300.  Laying comfortably in bed.  In no discomfort; norespiratory distress.   PSYCH: Alert and appropriately interactive; Insight:Absent   H&N: AT/[place], MMM, no scleral icterus, EOMi THORAX: HEART: RRR, S1/S2  heard, no murmur LUNGS: CTA B, no wheezes, no crackles ABDOMEN:  +BS, soft, non-tender, no rigidity, voluntary guarding, no masses/organomegaly EXTREMITIES: Moves all 4 extremities spontaneously, warm well perfused, no edema, bilateral DP and PT pulses 2/4.     LABS:  Lab 02/01/12 0406 02/01/12 0055 01/31/12 0355 01/30/12 1505  WBC 11.3* 10.9* 20.2* 14.4*  HGB 9.0* 9.4* 10.2* 10.5*  HCT 26.2* 27.4* 28.9* 29.8*  PLT 315 291 352 392    Lab 02/01/12 1453 02/01/12 1155 02/01/12 0406 02/01/12 0055 01/31/12 0355 01/30/12 2019 01/30/12 1842 01/30/12 1459  NA 134* 133* 136136 136 137 136 136 133*  K 3.3* 3.6 3.83.6 3.9 3.3* 3.4* 3.9 3.8  CL 102 102 104104 103 101 104 101 97  CO2 18* 18* 1919 20 18* 18* 20 17*  BUN 4* 5* 66 7 5* 8 10 11   CREATININE 0.64 0.62 0.680.68 0.69 0.57 0.55 0.66 0.54  CALCIUM 9.1 9.3 9.19.0 9.0 9.3 9.1 9.5 9.6  GLUCOSE 141* 173* 165*163* 213* 225* 206* 272* 334*    Metabolic:  Lab 02/01/12 1655 02/01/12 1550 02/01/12 1442 02/01/12 1334 02/01/12 1312 02/01/12 1225 02/01/12 1113 02/01/12 1010 02/01/12 0915 02/01/12 0754  GLUCAP 167* 149* 137* 141* 146* 155* 182* 184* 176* 153*   No results found for this basename: HGBA1C:3 in the last 168 hours  Lab 02/01/12 1453  MG 1.4*  PHOS 2.1*    Lab 02/01/12 1453 02/01/12 0406 01/30/12 1459  ALT 40* 35 23  AST 45* 46* 13  ALKPHOS 91 101 106  BILITOT 1.5* 1.0 0.5  BILIDIR 0.6* -- --    Lab 02/01/12 1453 02/01/12 0406 01/30/12 1459  PROT 6.7 6.8 7.3  ALBUMIN 2.7* 2.9* 3.7  PREALBUMIN -- -- --   No results found for this basename: CHOL:3,TRIG:3,HDL:3,LDL:3 in the last 168 hours No results found for this basename: MICROALBUR:3,MALB24HUR:3 in the last 168 hours No results found for this basename: TSH,T3FREE,FREET4,T3TOTAL:3,T4TOTAL:3,THYROIDAB:3 in the last 168 hours No results found for this basename: FOLATE:3,VITAMINB12:3 in the last 168 hours No results found for this basename: IRON:3,TIBC:3,FERRITIN:3  in the last 168 hours  No results found for this basename: LEAD:1 in the last 168 hours Hematology:  Lab 01/30/12 1505  LYMPHSABS 1.7  MONOABS 0.8  EOSABS 0.1  BASOSABS 0.0    Lab 02/01/12 0406 02/01/12 0055 01/31/12 0355  RDW 15.4 15.5 15.1  MCV 87.0 87.0 85.3  MCHC 34.4 34.3 35.3  MRET -- -- --    Lab 01/31/12 1400  INR 1.43  APTT 35     Other Labs:   Lab 01/31/12 1100  AMYLASE --  LIPASE 53    Lab 01/30/12 1500  LATICACIDVEN 3.3*  PROCALCITON --    Lab 01/30/12 1349  PREGTESTUR NEGATIVE  PREGSERUM --  HCG --  HCGQUANT --   No results found for this basename: TESTOSTERONE in the last 168 hours No results found for this basename: PTH:3,CAION:3 in the last 168 hours No results found for this basename: SPEP:3,UPEP:3 in the last 168 hours No results found for this basename: PROTEIN24HR:3 in the last 168 hours  No results found for this basename: HIV1X2,HAV,HEPAIGM,HEPBIGM,HEPBCAB,HBEAG,HEPCAB in the last 168 hours No results found for this basename: ANA,RF,SMOOTHMUSCAB,MITOAB in the last 168 hours  Drug Levels: No results found for this basename: LABOPIA:5,COCAINSCRNUR:5,LABBENZ:5,AMPHETMU:5,THCU:5,LABBARB:5,ETH:5 in the last 168 hours No results found for this basename: PHENYTOIN:3,PHENOBARB:3,VALPROATE:3,CBMZ:3,LITHIUM:3 in the last 168 hours No results found for this basename: DIGOXIN:3 in the last 168 hours  Urinalysis  Lab 01/30/12 1346  COLORURINE YELLOW  APPEARANCEUR CLEAR  LABSPEC 1.020  PHURINE 5.5  GLUCOSEU >1000*  HGBUR NEGATIVE  BILIRUBINUR NEGATIVE  KETONESUR NEGATIVE  PROTEINUR NEGATIVE  UROBILINOGEN 0.2  NITRITE NEGATIVE  LEUKOCYTESUR TRACE*   MICRO: Blood Cultures - Gram negative Rods X 1 Urine Cultures - >100,000 CFUs of Multiple morphotypes  IMAGING: (in last 48 Hours) Dg Cholangiogram Operative  01/31/2012  *RADIOLOGY REPORT*  Clinical Data: Laparoscopic cholecystectomy.  Fluoro time 22.9 seconds.  INTRAOPERATIVE  CHOLANGIOGRAM  Technique: Spot fluoroscopic images of the right upper quadrant are obtained intraoperatively during injection of contrast material into the biliary system.  Comparison:  None.  Findings: Extrahepatic bile ducts demonstrate normal caliber with free flow of contrast material to the duodenum.  No intraluminal filling defects are demonstrated.  No evidence of common duct stone or contrast extravasation.  Limited visualization of intrahepatic bile ducts with predominant right-sided ducts visualized. Visualized intrahepatic  bile ducts are normal in caliber.  IMPRESSION: No evidence of biliary obstruction, bile duct stone, or contrast extravasation.  Original Report Authenticated By: Marlon Pel, M.D.   US Abdomen Complete  01/31/2012  *RADIOLOGY REPORT*  Clinical Data:   cholelithiasis, fever, abdominal pain  COMPLETE ABDOMINAL ULTRASOUND  Comparison:  01/23/2012  Findings:  Gallbladder:  Again noted multiple mobile gallstones within gallbladder.  The largest measures about 6.5 mm.  No thickening of gallbladder wall.  No sonographic Murphy's sign.  Common bile duct:  Measures 4 mm in diameter within normal limits.  Liver:  No focal lesion identified. Again noted diffuse increased echogenicity of the liver consistent with fatty infiltration.  No intrahepatic biliary ductal dilatation.  IVC:  Appears normal.  Pancreas:  No focal abnormality seen.  Spleen:  Measures 7.3 cm in length.  Normal echogenicity.  Right Kidney:  Measures 13 cm in length.  No mass, hydronephrosis or diagnostic renal calculus  Left Kidney:  Measures 13.1 cm in length.  No mass, hydronephrosis or diagnostic renal calculus  Abdominal aorta:  No aneurysm identified. Measures up to 1.8 cm in diameter.  Small amount of free fluid noted in Morison's pouch.  IMPRESSION:  1.  Again noted multiple mobile gallstones within gallbladder.  No thickening of the gallbladder wall or sonographic Murphy's sign. 2.  Normal CBD. 3.  No  hydronephrosis or diagnostic renal calculus. 4.  Again noted fatty infiltration of the liver.  Original Report Authenticated By: Natasha Mead, M.D.    MEDS: I have reviewed all medications and made changes as below.            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  Initial gap (with corrected sodium) was 23; now improved to 13 X 12hr continuing on IVF Glucose & Insulin   - Continue to replete K in IVFs - [ ]  Mg & Phos on next draw - pt still appears clinically dry and has poor UOP - will bolus additional 1 L following earlier 1 L bolus and continue fluids at 262ml/hr  # Acute Cholecystitis - S/p Lap Cholecystectomy on 01/31/12 early AM (post op day #1) - continue pain control - change to Dilaudid as pt is currently   # Fever: fever curve trending down, however WBC increased from 14.4 to 20.2; now to 11.3  Has been afebrile X 24+ hrs.    CXR on admission showing no active disease or significant change; no edema or acute infiltrate.  UA with rare bacteria, trace leukocytes, >1000 glucose.   - Blood and urine cultures sent from ED - NEGATIVE X 2 days - Tylenol (and toradol) PRN fever  - Given broad nature of symptoms, this is most likely due to a viral process; patient is not neutropenic, however, will continue Unasyn to cover for possible abdominal etiology (pt is a diabetic with known gallstones)   # Abdominal pain, nausea, vomiting: has had multiple admissions for this over the past month.  DDx includes residual pleuritic pain from PNA vs cholelithiasis vs viral process. - For pain, will start Oxycodone 10 q 4PRN since this controlled patient's pain at home.  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

## 2012-02-01 NOTE — Progress Notes (Signed)
[facility] Progress Note 1 Day Post-Op  Subjective: Doing well postoperatively..  On insulin drip for hyperglycemia, NPO per medicine.  Objective: Vital signs in last 24 hours: Temp:  [97 F (36.1 C)-103.9 F (39.9 C)] 99.9 F (37.7 C) (05/13 0800) Pulse Rate:  [111-138] 137  (05/13 0349) Resp:  [18-43] 27  (05/13 0349) BP: (115-151)/(61-109) 115/63 mmHg (05/13 0800) SpO2:  [93 %-100 %] 96 % (05/13 0349) FiO2 (%):  [2 %] 2 % (05/12 2140) Last BM Date: 01/30/12  Intake/Output from previous day: 05/12 0701 - 05/13 0700 In: 1920 [P.O.:120; I.V.:1750; IV Piggyback:50] Out: 2000 [Urine:1950; Blood:50] Intake/Output this shift:    General: No acute distress  Lungs: Clear  Abd: Soft, minimally tender, good bowel sounds.  Extremities: No DVT  Neuro: Intact  Lab Results:  @LABLAST2 (wbc:2,hgb:2,hct:2,plt:2) BMET  Basename 02/01/12 0406 02/01/12 0055  NA 454098 136  K 3.83.6 3.9  CL 104104 103  CO2 1919 20  GLUCOSE 165*163* 213*  BUN 66 7  CREATININE 0.680.68 0.69  CALCIUM 9.19.0 9.0   PT/INR  Basename 01/31/12 1400  LABPROT 17.7*  INR 1.43   ABG No results found for this basename: PHART:2,PCO2:2,PO2:2,HCO3:2 in the last 72 hours  Studies/Results: Dg Chest 2 View  01/30/2012  *RADIOLOGY REPORT*  Clinical Data: Fever  CHEST - 2 VIEW  Comparison: 01/19/2012  Findings: Cardiomegaly again noted.  Right IJ Port-A-Cath is unchanged in position.  Bony thorax is stable.  No acute infiltrate or pulmonary edema.  Stable mild elevation of the right hemidiaphragm with right basilar atelectasis.  IMPRESSION: No active disease.  No significant change.  Original Report Authenticated By: Natasha Mead, M.D.   Dg Cholangiogram Operative  01/31/2012  *RADIOLOGY REPORT*  Clinical Data: Laparoscopic cholecystectomy.  Fluoro time 22.9 seconds.  INTRAOPERATIVE CHOLANGIOGRAM  Technique: Spot fluoroscopic images of the right upper quadrant are obtained intraoperatively during injection  of contrast material into the biliary system.  Comparison:  None.  Findings: Extrahepatic bile ducts demonstrate normal caliber with free flow of contrast material to the duodenum.  No intraluminal filling defects are demonstrated.  No evidence of common duct stone or contrast extravasation.  Limited visualization of intrahepatic bile ducts with predominant right-sided ducts visualized. Visualized intrahepatic bile ducts are normal in caliber.  IMPRESSION: No evidence of biliary obstruction, bile duct stone, or contrast extravasation.  Original Report Authenticated By: Marlon Pel, M.D.   US Abdomen Complete  01/31/2012  *RADIOLOGY REPORT*  Clinical Data:   cholelithiasis, fever, abdominal pain  COMPLETE ABDOMINAL ULTRASOUND  Comparison:  01/23/2012  Findings:  Gallbladder:  Again noted multiple mobile gallstones within gallbladder.  The largest measures about 6.5 mm.  No thickening of gallbladder wall.  No sonographic Murphy's sign.  Common bile duct:  Measures 4 mm in diameter within normal limits.  Liver:  No focal lesion identified. Again noted diffuse increased echogenicity of the liver consistent with fatty infiltration.  No intrahepatic biliary ductal dilatation.  IVC:  Appears normal.  Pancreas:  No focal abnormality seen.  Spleen:  Measures 7.3 cm in length.  Normal echogenicity.  Right Kidney:  Measures 13 cm in length.  No mass, hydronephrosis or diagnostic renal calculus  Left Kidney:  Measures 13.1 cm in length.  No mass, hydronephrosis or diagnostic renal calculus  Abdominal aorta:  No aneurysm identified. Measures up to 1.8 cm in diameter.  Small amount of free fluid noted in Morison's pouch.  IMPRESSION:  1.  Again noted multiple mobile gallstones within gallbladder.  No thickening  of the gallbladder wall or sonographic Murphy's sign. 2.  Normal CBD. 3.  No hydronephrosis or diagnostic renal calculus. 4.  Again noted fatty infiltration of the liver.  Original Report Authenticated By: Natasha Mead, M.D.    Anti-infectives: Anti-infectives     Start     Dose/Rate Route Frequency Ordered Stop   02/01/12 0500   cefOXitin (MEFOXIN) 1 g in dextrose 5 % 50 mL IVPB  Status:  Discontinued        1 g 100 mL/hr over 30 Minutes Intravenous 60 min pre-op 01/31/12 1343 01/31/12 2152   01/31/12 1400  piperacillin-tazobactam (ZOSYN) IVPB 3.375 g       3.375 g 12.5 mL/hr over 240 Minutes Intravenous 3 times per day 01/31/12 1152     01/31/12 0100   Ampicillin-Sulbactam (UNASYN) 3 g in sodium chloride 0.9 % 100 mL IVPB  Status:  Discontinued        3 g 100 mL/hr over 60 Minutes Intravenous Every 6 hours 01/31/12 0042 01/31/12 1152   01/30/12 1700   cefTRIAXone (ROCEPHIN) 1 g in dextrose 5 % 50 mL IVPB        1 g 100 mL/hr over 30 Minutes Intravenous  Once 01/30/12 1654 01/30/12 1824          Assessment/Plan: s/p Procedure(s): LAPAROSCOPIC CHOLECYSTECTOMY WITH INTRAOPERATIVE CHOLANGIOGRAM Advance diet Doing well from our standpoint, can advance diet.  LOS: 2 days   Marta Lamas. Gae Bon, MD, FACS [phone] [facility] 02/01/2012

## 2012-02-01 NOTE — Progress Notes (Signed)
Temp. At this time is 50, have notified Gaspar Bidding, PA for family practice. Pt. Has received 1000mg  of tylenol po. -

## 2012-02-01 NOTE — Progress Notes (Signed)
ANTIBIOTIC CONSULT NOTE - INITIAL  Pharmacy Consult for imipenem Indication: Gram-negative bacteremia  Allergies  Allergen Reactions  . Ultram (Tramadol Hcl) Nausea And Vomiting    Patient Measurements: Height: 5\' 2"  (157.5 cm) Weight: 284 lb 13.4 oz (129.2 kg) IBW/kg (Calculated) : 50.1  Adjusted Body Weight:   Vital Signs: Temp: 102.1 F (38.9 C) (05/13 1531) Temp src: Oral (05/13 1531) BP: 127/70 mmHg (05/13 1531) Intake/Output from previous day: 05/12 0701 - 05/13 0700 In: 1920 [P.O.:120; I.V.:1750; IV Piggyback:50] Out: 2000 [Urine:1950; Blood:50] Intake/Output from this shift: Total I/O In: 1003 [I.V.:3; IV Piggyback:1000] Out: 500 [Urine:500]  Labs:  [facility] 02/01/12 1453 02/01/12 1155 02/01/12 0406 02/01/12 0055 01/31/12 0355  WBC -- -- 11.3* 10.9* 20.2*  HGB -- -- 9.0* 9.4* 10.2*  PLT -- -- 315 291 352  LABCREA -- -- -- -- --  CREATININE 0.64 0.62 0.680.68 -- --   Estimated Creatinine Clearance: 141.1 ml/min (by C-G formula based on Cr of 0.64). No results found for this basename: VANCOTROUGH:2,VANCOPEAK:2,VANCORANDOM:2,GENTTROUGH:2,GENTPEAK:2,GENTRANDOM:2,TOBRATROUGH:2,TOBRAPEAK:2,TOBRARND:2,AMIKACINPEAK:2,AMIKACINTROU:2,AMIKACIN:2, in the last 72 hours   Microbiology: Recent Results (from the past 720 hour(s))  URINE CULTURE     Status: Normal   Collection Time   01/17/12  3:52 AM      Component Value Range Status Comment   Specimen Description URINE, CLEAN CATCH   Final    Special Requests NONE   Final    Culture  Setup Time [id number]   Final    Colony Count >=100,000 COLONIES/ML   Final    Culture KLEBSIELLA PNEUMONIAE   Final    Report Status 01/19/2012 FINAL   Final    Organism ID, Bacteria KLEBSIELLA PNEUMONIAE   Final   CULTURE, BLOOD (ROUTINE X 2)     Status: Normal   Collection Time   01/17/12  4:26 PM      Component Value Range Status Comment   Specimen Description BLOOD LEFT ARM   Final    Special Requests BOTTLES DRAWN AEROBIC ONLY  1CC   Final    Culture  Setup Time [id number]   Final    Culture NO GROWTH 5 DAYS   Final    Report Status 01/23/2012 FINAL   Final   CULTURE, BLOOD (ROUTINE X 2)     Status: Normal   Collection Time   01/17/12  4:50 PM      Component Value Range Status Comment   Specimen Description BLOOD LEFT HAND   Final    Special Requests BOTTLES DRAWN AEROBIC ONLY [facility]   Final    Culture  Setup Time [id number]   Final    Culture NO GROWTH 5 DAYS   Final    Report Status 01/23/2012 FINAL   Final   URINE CULTURE     Status: Normal   Collection Time   01/23/12  8:38 PM      Component Value Range Status Comment   Specimen Description URINE, CLEAN CATCH   Final    Special Requests Immunocompromised   Final    Culture  Setup Time [id number]   Final    Colony Count 25,000 COLONIES/ML   Final    Culture     Final    Value: Multiple bacterial morphotypes present, none predominant. Suggest appropriate recollection if clinically indicated.   Report Status 01/25/2012 FINAL   Final   URINE CULTURE     Status: Normal   Collection Time   01/30/12  1:46 PM      Component Value Range Status  Comment   Specimen Description URINE, RANDOM   Final    Special Requests NONE   Final    Culture  Setup Time [id number]   Final    Colony Count >=100,000 COLONIES/ML   Final    Culture     Final    Value: Multiple bacterial morphotypes present, none predominant. Suggest appropriate recollection if clinically indicated.   Report Status 01/31/2012 FINAL   Final   CULTURE, BLOOD (ROUTINE X 2)     Status: Normal (Preliminary result)   Collection Time   01/30/12  2:51 PM      Component Value Range Status Comment   Specimen Description BLOOD RIGHT ARM   Final    Special Requests BOTTLES DRAWN AEROBIC AND ANAEROBIC 10CC   Final    Culture  Setup Time [id number]   Final    Culture     Final    Value: GRAM NEGATIVE RODS     Note: Gram Stain Report Called to,Read Back By and Verified With: RN A. COLE ON 01/31/12 AT 1110  BY DTERRY   Report Status PENDING   Incomplete   CULTURE, BLOOD (ROUTINE X 2)     Status: Normal (Preliminary result)   Collection Time   01/30/12  2:58 PM      Component Value Range Status Comment   Specimen Description BLOOD LEFT ARM   Final    Special Requests BOTTLES DRAWN AEROBIC AND ANAEROBIC 10CC   Final    Culture  Setup Time [id number]   Final    Culture     Final    Value:        BLOOD CULTURE RECEIVED NO GROWTH TO DATE CULTURE WILL BE HELD FOR 5 DAYS BEFORE ISSUING A FINAL NEGATIVE REPORT   Report Status PENDING   Incomplete   MRSA PCR SCREENING     Status: Normal   Collection Time   01/31/12  2:16 PM      Component Value Range Status Comment   MRSA by PCR NEGATIVE  NEGATIVE  Final     Medical History: Past Medical History  Diagnosis Date  . Diabetes mellitus   . Hypertension   . Nephrolithiasis   . 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 Hepatic steatosis 01/17/2012  . Cholelithiasis 01/17/2012    Medications:  Scheduled:    . acetaminophen  325 mg Oral Once  . enoxaparin  40 mg Subcutaneous Q24H  . insulin aspart  0-20 Units Subcutaneous Once  . magnesium sulfate 1 - 4 g bolus IVPB  2 g Intravenous Once  . polyethylene glycol  17 g Oral Daily  . potassium chloride  10 mEq Intravenous Q1H  . senna-docusate  1 tablet Oral QHS  . sodium chloride  1,000 mL Intravenous Once  . sodium chloride  3 mL Intravenous Q12H  . sodium chloride      . tretinoin  45 mg/m2/day Oral BID WC  . DISCONTD: cefOXitin  1 g Intravenous 60 min Pre-Op  . DISCONTD: cefOXitin  1 g Intravenous 60 min Pre-Op  . DISCONTD: heparin  5,000 Units Subcutaneous Q8H  . DISCONTD: insulin aspart  0-20 Units Subcutaneous Q4H  . DISCONTD: insulin aspart  0-20 Units Subcutaneous Q4H  . DISCONTD: insulin aspart  0-5 Units Subcutaneous QHS  . DISCONTD: insulin aspart  3 Units Subcutaneous TID WC  . DISCONTD: piperacillin-tazobactam (ZOSYN)  IV  3.375 g Intravenous Q8H  . DISCONTD:  sodium chloride  1,000 mL  Intravenous Once   Assessment: 11 YOF with leukemia s/p lap cholecystectomy on 5/12.  Orders to change zosyn to imipenem for continued fever on zosyn for GNR bacteremia.   4/28 Urine cx = K. Pneumoniae (R to amp) 5/4 urine cx = 25 Multiple bacterial morphologies 5/11 urine cx + 100K Multiple bacterial morphologies 5/11 BC = GNR 1/2  Goal of Therapy:  Eradiction of  Infection.  Plan:  1. Imipenem 500mg  IV q6h, f/u culture data  Dannielle Huh 02/01/2012,4:42 PM

## 2012-02-01 NOTE — Progress Notes (Signed)
Family medicine physician notified about patient temperature of 102.1 and respirations in 39-42 range; pt given 650 mg tylenol for fever; no new orders received; will continue to monitor

## 2012-02-01 NOTE — Progress Notes (Signed)
FMTS Attending Note  Patient seen and examined by me, discussed with resident team.  Patient has continued to spike fevers and remain tachycardic.  Raises concern for Pseudomonas, in setting of GNR in blood culture in patient with central line and history malignancy.  To change to anti-pseudomonal PCN and await speciation and antibiogram, as patient continues with fevers and tachycardia.  Additional NS fluid bolus.  Close monitoring. Paula Compton, MD

## 2012-02-01 NOTE — Progress Notes (Signed)
INITIAL ADULT NUTRITION ASSESSMENT Date: 02/01/2012   Time: 11:32 AM  Reason for Assessment: Nutrition Risk report (unintentional weight loss)  ASSESSMENT: Female 26 y.o.  Dx: Acute cholecystitis; S/P Laparoscopic cholecystectomy with intraoperative cholangiogram 5/12  Hx:  Past Medical History  Diagnosis Date  . Diabetes mellitus   . Hypertension   . Nephrolithiasis   . 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 Hepatic steatosis 01/17/2012  . Cholelithiasis 01/17/2012    Related Meds:  Scheduled Meds:   . acetaminophen  325 mg Oral Once  . enoxaparin  40 mg Subcutaneous Q24H  . insulin aspart  0-20 Units Subcutaneous Once  . piperacillin-tazobactam (ZOSYN)  IV  3.375 g Intravenous Q8H  . polyethylene glycol  17 g Oral Daily  . potassium chloride  10 mEq Intravenous Q1H  . senna-docusate  1 tablet Oral QHS  . sodium chloride  1,000 mL Intravenous Once  . sodium chloride  3 mL Intravenous Q12H  . sodium chloride      . sodium chloride      . tretinoin  45 mg/m2/day Oral BID WC  . DISCONTD: ampicillin-sulbactam (UNASYN) IV  3 g Intravenous Q6H  . DISCONTD: cefOXitin  1 g Intravenous 60 min Pre-Op  . DISCONTD: cefOXitin  1 g Intravenous 60 min Pre-Op  . DISCONTD: heparin  5,000 Units Subcutaneous Q8H  . DISCONTD: insulin aspart  0-15 Units Subcutaneous TID WC  . DISCONTD: insulin aspart  0-20 Units Subcutaneous TID WC  . DISCONTD: insulin aspart  0-20 Units Subcutaneous Q4H  . DISCONTD: insulin aspart  0-20 Units Subcutaneous Q4H  . DISCONTD: insulin aspart  0-5 Units Subcutaneous QHS  . DISCONTD: insulin aspart  0-5 Units Subcutaneous QHS  . DISCONTD: insulin aspart  0-5 Units Subcutaneous QHS  . DISCONTD: insulin aspart  0-9 Units Subcutaneous Q4H  . DISCONTD: insulin aspart  3 Units Subcutaneous TID WC  . DISCONTD: lisinopril  10 mg Oral Daily  . DISCONTD: magnesium oxide  200 mg Oral Daily  . DISCONTD: metFORMIN  1,000 mg Oral Q breakfast  .  DISCONTD: tretinoin  45 mg/m2/day Oral BID WC  . DISCONTD: tretinoin  45 mg/m2/day Oral BID WC   Continuous Infusions:   . sodium chloride 999 mL/hr at 01/31/12 2305  . sodium chloride 100 mL/hr at 02/01/12 0004  . dextrose 5 % and 0.45 % NaCl with KCl 20 mEq/L    . insulin (NOVOLIN-R) infusion 4.9 Units/hr (02/01/12 1117)  . DISCONTD: dextrose 5 % and 0.45 % NaCl with KCl 20 mEq/L 150 mL/hr at 01/31/12 1752  . DISCONTD: dextrose 5 % and 0.45 % NaCl with KCl 20 mEq/L    . DISCONTD: dextrose 5 % and 0.45% NaCl 100 mL/hr at 02/01/12 1100  . DISCONTD: midazolam (VERSED) infusion     PRN Meds:.acetaminophen, acetaminophen, alum & mag hydroxide-simeth, dextrose, ketorolac, morphine injection, ondansetron (ZOFRAN) IV, oxyCODONE-acetaminophen, promethazine, DISCONTD: acetaminophen, DISCONTD: acetaminophen, DISCONTD: bupivacaine-EPINEPHrine, DISCONTD:  HYDROmorphone (DILAUDID) injection, DISCONTD: labetalol, DISCONTD:  morphine injection, DISCONTD: Omnipaque 300 mg/mL (50 mL) in 0.9% normal saline (50 mL), DISCONTD: oxyCODONE DISCONTD: promethazine, DISCONTD: sodium chloride irrigation   Ht: 5\' 2"  (157.5 cm)  Wt: 284 lb 13.4 oz (129.2 kg)  Ideal Wt: 50 kg % Ideal Wt: 258%  Usual Wt: 276 lb % Usual Wt: 103%  Body mass index is 52.10 kg/(m^2).  Food/Nutrition Related Hx: Appetite was okay PTA; drank a strawberry supplement when she was in the hospital previously  Labs:  CMP     Component Value Date/Time   NA 136 02/01/2012 0406   NA 136 02/01/2012 0406   K 3.8 02/01/2012 0406   K 3.6 02/01/2012 0406   CL 104 02/01/2012 0406   CL 104 02/01/2012 0406   CO2 19 02/01/2012 0406   CO2 19 02/01/2012 0406   GLUCOSE 165* 02/01/2012 0406   GLUCOSE 163* 02/01/2012 0406   BUN 6 02/01/2012 0406   BUN 6 02/01/2012 0406   CREATININE 0.68 02/01/2012 0406   CREATININE 0.68 02/01/2012 0406   CALCIUM 9.1 02/01/2012 0406   CALCIUM 9.0 02/01/2012 0406   PROT 6.8 02/01/2012 0406   ALBUMIN 2.9* 02/01/2012 0406    AST 46* 02/01/2012 0406   ALT 35 02/01/2012 0406   ALKPHOS 101 02/01/2012 0406   BILITOT 1.0 02/01/2012 0406   GFRNONAA >90 02/01/2012 0406   GFRNONAA >90 02/01/2012 0406   GFRAA >90 02/01/2012 0406   GFRAA >90 02/01/2012 0406    CBG (last 3)   Basename 02/01/12 1113 02/01/12 1010 02/01/12 0915  GLUCAP 182* 184* 176*     Intake/Output Summary (Last 24 hours) at 02/01/12 1143 Last data filed at 02/01/12 0405  Gross per 24 hour  Intake   1920 ml  Output   2000 ml  Net    -80 ml     Diet Order: NPO  IVF:    sodium chloride Last Rate: 999 mL/hr at 01/31/12 2305  sodium chloride Last Rate: 100 mL/hr at 02/01/12 0004  dextrose 5 % and 0.45 % NaCl with KCl 20 mEq/L   insulin (NOVOLIN-R) infusion Last Rate: 4.9 Units/hr (02/01/12 1117)  DISCONTD: dextrose 5 % and 0.45 % NaCl with KCl 20 mEq/L Last Rate: 150 mL/hr at 01/31/12 1752  DISCONTD: dextrose 5 % and 0.45 % NaCl with KCl 20 mEq/L   DISCONTD: dextrose 5 % and 0.45% NaCl Last Rate: 100 mL/hr at 02/01/12 1100  DISCONTD: midazolam (VERSED) infusion     Estimated Nutritional Needs:   Kcal: 2000-2200 Protein: 100-125 grams Fluid: 2-2.2 liters  Diet order remains NPO, however noted plans to advance diet per Surgery MD note.  Patient reports that her weight has been up and down recently.  Likes strawberry Glucerna, but has not been to the store to get any lately.  Currently appetite is poor.  Patient is morbidly obese with BMI=52.1 (class 3 extreme obesity).  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  Currently on induction chemotherapy.  NUTRITION DIAGNOSIS: -Inadequate oral intake (NI-2.1).  Status: Ongoing  RELATED TO: poor appetite and recent surgery  AS EVIDENCE BY: reported weight fluctuation PTA and current NPO status  MONITORING/EVALUATION(Goals): Goal:  Intake to meet 90-100% of estimated nutrition needs. Monitor:  Labs, weight trend, PO intake.  EDUCATION NEEDS: -Education not appropriate at this  time  INTERVENTION:  Recommend advance diet as tolerated to Regular diet.  When diet advanced past Clear Liquids, add Strawberry Ensure Complete PO BID to increase intake of protein and calories.  Dietitian #:  [phone]  DOCUMENTATION CODES Per approved criteria  -Morbid Obesity    Hettie Holstein 02/01/2012, 11:32 AM

## 2012-02-02 ENCOUNTER — Inpatient Hospital Stay: Payer: Medicaid Other | Admitting: Family Medicine

## 2012-02-02 ENCOUNTER — Ambulatory Visit: Payer: Self-pay

## 2012-02-02 DIAGNOSIS — R7881 Bacteremia: Secondary | ICD-10-CM | POA: Diagnosis present

## 2012-02-02 DIAGNOSIS — A414 Sepsis due to anaerobes: Secondary | ICD-10-CM | POA: Diagnosis not present

## 2012-02-02 LAB — GLUCOSE, CAPILLARY
Glucose-Capillary: 141 mg/dL — ABNORMAL HIGH (ref 70–99)
Glucose-Capillary: 152 mg/dL — ABNORMAL HIGH (ref 70–99)
Glucose-Capillary: 156 mg/dL — ABNORMAL HIGH (ref 70–99)
Glucose-Capillary: 161 mg/dL — ABNORMAL HIGH (ref 70–99)
Glucose-Capillary: 186 mg/dL — ABNORMAL HIGH (ref 70–99)
Glucose-Capillary: 208 mg/dL — ABNORMAL HIGH (ref 70–99)
Glucose-Capillary: 158 mg/dL — ABNORMAL HIGH (ref 70–99)

## 2012-02-02 LAB — CULTURE, BLOOD (ROUTINE X 2)

## 2012-02-02 LAB — HEPATIC FUNCTION PANEL
ALT: 45 U/L — ABNORMAL HIGH (ref 0–35)
AST: 47 U/L — ABNORMAL HIGH (ref 0–37)
Bilirubin, Direct: 0.6 mg/dL — ABNORMAL HIGH (ref 0.0–0.3)
Indirect Bilirubin: 0.8 mg/dL (ref 0.3–0.9)
Total Bilirubin: 1.4 mg/dL — ABNORMAL HIGH (ref 0.3–1.2)

## 2012-02-02 LAB — BASIC METABOLIC PANEL
Calcium: 9.2 mg/dL (ref 8.4–10.5)
GFR calc non Af Amer: >90 mL/min (ref >90)
Sodium: 136 mEq/L (ref 135–145)

## 2012-02-02 MED ORDER — SODIUM CHLORIDE 0.9 % IJ SOLN
INTRAMUSCULAR | Status: AC
  Administered 2012-02-02: 10 mL
  Filled 2012-02-02: qty 10

## 2012-02-02 MED ORDER — MORPHINE SULFATE 4 MG/ML IJ SOLN
4.0000 mg | INTRAMUSCULAR | Status: DC | PRN
  Administered 2012-02-02 – 2012-02-03 (×2): 4 mg via INTRAVENOUS
  Filled 2012-02-02: qty 1

## 2012-02-02 MED ORDER — INSULIN ASPART 100 UNIT/ML ~~LOC~~ SOLN
4.0000 [IU] | Freq: Three times a day (TID) | SUBCUTANEOUS | Status: DC
  Administered 2012-02-02 (×3): 4 [IU] via SUBCUTANEOUS
  Administered 2012-02-03: 12:00:00 via SUBCUTANEOUS
  Administered 2012-02-04 (×2): 4 [IU] via SUBCUTANEOUS

## 2012-02-02 MED ORDER — POTASSIUM CHLORIDE CRYS ER 20 MEQ PO TBCR
20.0000 meq | EXTENDED_RELEASE_TABLET | Freq: Two times a day (BID) | ORAL | Status: DC
  Administered 2012-02-02 – 2012-02-04 (×4): 20 meq via ORAL
  Filled 2012-02-02 (×7): qty 1

## 2012-02-02 MED ORDER — LISINOPRIL 10 MG PO TABS
10.0000 mg | ORAL_TABLET | Freq: Every day | ORAL | Status: DC
  Administered 2012-02-02 – 2012-02-04 (×3): 10 mg via ORAL
  Filled 2012-02-02 (×3): qty 1

## 2012-02-02 MED ORDER — INSULIN GLARGINE 100 UNIT/ML ~~LOC~~ SOLN
35.0000 [IU] | Freq: Every day | SUBCUTANEOUS | Status: DC
  Administered 2012-02-02: 35 [IU] via SUBCUTANEOUS
  Administered 2012-02-03: 12:00:00 via SUBCUTANEOUS
  Administered 2012-02-04: 35 [IU] via SUBCUTANEOUS

## 2012-02-02 MED ORDER — ENSURE COMPLETE PO LIQD
237.0000 mL | Freq: Every day | ORAL | Status: DC
  Administered 2012-02-03: 237 mL via ORAL

## 2012-02-02 MED ORDER — POTASSIUM CHLORIDE IN NACL 20-0.45 MEQ/L-% IV SOLN
INTRAVENOUS | Status: DC
  Administered 2012-02-02 – 2012-02-03 (×2): via INTRAVENOUS
  Administered 2012-02-04: 50 mL via INTRAVENOUS
  Filled 2012-02-02 (×7): qty 1000

## 2012-02-02 MED ORDER — CIPROFLOXACIN HCL 500 MG PO TABS
500.0000 mg | ORAL_TABLET | Freq: Two times a day (BID) | ORAL | Status: DC
  Administered 2012-02-02 – 2012-02-04 (×5): 500 mg via ORAL
  Filled 2012-02-02 (×9): qty 1

## 2012-02-02 MED ORDER — INSULIN GLARGINE 100 UNIT/ML ~~LOC~~ SOLN: 35.0000 [IU] | Freq: Once | SUBCUTANEOUS | Status: DC

## 2012-02-02 MED ORDER — INSULIN GLARGINE 100 UNIT/ML ~~LOC~~ SOLN: 35.0000 [IU] | Freq: Every day | SUBCUTANEOUS | Status: DC

## 2012-02-02 MED ORDER — MORPHINE SULFATE 4 MG/ML IJ SOLN
INTRAMUSCULAR | Status: AC
  Filled 2012-02-02: qty 1

## 2012-02-02 MED ORDER — INSULIN ASPART 100 UNIT/ML ~~LOC~~ SOLN
0.0000 [IU] | Freq: Three times a day (TID) | SUBCUTANEOUS | Status: DC
  Administered 2012-02-02: 4 [IU] via SUBCUTANEOUS
  Administered 2012-02-02: 7 [IU] via SUBCUTANEOUS
  Administered 2012-02-02: 3 [IU] via SUBCUTANEOUS
  Administered 2012-02-03: 7 [IU] via SUBCUTANEOUS
  Administered 2012-02-04: 4 [IU] via SUBCUTANEOUS
  Administered 2012-02-04: 7 [IU] via SUBCUTANEOUS
  Administered 2012-02-04 (×2): 3 [IU] via SUBCUTANEOUS

## 2012-02-02 NOTE — Progress Notes (Signed)
Utilization review completed.  

## 2012-02-02 NOTE — Progress Notes (Signed)
PCCM Follow-up note  Pt doing well, insulin gtt off.  We will sign off, please call if we can help in any way  Levy Pupa, MD, PhD 02/02/2012, 9:17 AM [facility] [phone] or if no answer [phone]

## 2012-02-02 NOTE — Progress Notes (Signed)
Patient transitioned off IV insulin drip this morning to Lantus 35 units and Novolog SSI and meal coverage.  A1c shows fair control at home (slightly above goal).  Results for Melissa Washington, Melissa Washington (MRN [id number]) as of 02/02/2012 12:37  Ref. Range 01/24/2012 04:30  Hemoglobin A1C Latest Range: <5.7 % 8.1 (H)   Spoke with Resident, Dr. Clinton Sawyer, to inquire if the Family Medicine team plans to send [name] home on insulin.  Current thoughts are that patient is requiring insulin at this time b/c of her infection.  Hopefully, patient will not require insulin for home, per Dr. Clinton Sawyer.  Will continue to follow and assess for d/c needs.  Ambrose Finland RN, MSN, CDE Diabetes Coordinator Inpatient Diabetes Program [phone]

## 2012-02-02 NOTE — Progress Notes (Signed)
CCS/Melissa Washington Progress Note 2 Days Post-Op  Subjective: Doing well.  Wants to eat.   Insulin drip stopped.  Objective: Vital signs in last 24 hours: Temp:  [97.8 F (36.6 C)-103 F (39.4 C)] 100.3 F (37.9 C) (05/14 0408) Pulse Rate:  [109-119] 119  (05/14 0408) Resp:  [22-39] 30  (05/14 0408) BP: (111-143)/(56-106) 131/66 mmHg (05/14 0408) SpO2:  [94 %-100 %] 94 % (05/14 0408) Last BM Date: 01/30/12  Intake/Output from previous day: 05/13 0701 - 05/14 0700 In: 3113 [I.V.:1863; IV Piggyback:1250] Out: 3075 [Urine:3075] Intake/Output this shift:    General: No acute distress  Lungs: Clear  Abd: Soft, good bowel sounds.  Extremities: No DVT signs or symptoms.  Neuro: Intact  Lab Results:   BMET  Basename 02/02/12 0410 02/01/12 2100  NA 136 137  K 3.2* 3.4*  CL 103 105  CO2 21 22  GLUCOSE 154* 189*  BUN 4* 4*  CREATININE 0.54 0.54  CALCIUM 9.2 9.1   PT/INR  Basename 01/31/12 1400  LABPROT 17.7*  INR 1.43   ABG No results found for this basename: PHART:2,PCO2:2,PO2:2,HCO3:2 in the last 72 hours  Studies/Results: Dg Cholangiogram Operative  01/31/2012  *RADIOLOGY REPORT*  Clinical Data: Laparoscopic cholecystectomy.  Fluoro time 22.9 seconds.  INTRAOPERATIVE CHOLANGIOGRAM  Technique: Spot fluoroscopic images of the right upper quadrant are obtained intraoperatively during injection of contrast material into the biliary system.  Comparison:  None.  Findings: Extrahepatic bile ducts demonstrate normal caliber with free flow of contrast material to the duodenum.  No intraluminal filling defects are demonstrated.  No evidence of common duct stone or contrast extravasation.  Limited visualization of intrahepatic bile ducts with predominant right-sided ducts visualized. Visualized intrahepatic bile ducts are normal in caliber.  IMPRESSION: No evidence of biliary obstruction, bile duct stone, or contrast extravasation.  Original Report Authenticated By: Marlon Pel,  M.D.   US Abdomen Complete  01/31/2012  *RADIOLOGY REPORT*  Clinical Data:   cholelithiasis, fever, abdominal pain  COMPLETE ABDOMINAL ULTRASOUND  Comparison:  01/23/2012  Findings:  Gallbladder:  Again noted multiple mobile gallstones within gallbladder.  The largest measures about 6.5 mm.  No thickening of gallbladder wall.  No sonographic Murphy's sign.  Common bile duct:  Measures 4 mm in diameter within normal limits.  Liver:  No focal lesion identified. Again noted diffuse increased echogenicity of the liver consistent with fatty infiltration.  No intrahepatic biliary ductal dilatation.  IVC:  Appears normal.  Pancreas:  No focal abnormality seen.  Spleen:  Measures 7.3 cm in length.  Normal echogenicity.  Right Kidney:  Measures 13 cm in length.  No mass, hydronephrosis or diagnostic renal calculus  Left Kidney:  Measures 13.1 cm in length.  No mass, hydronephrosis or diagnostic renal calculus  Abdominal aorta:  No aneurysm identified. Measures up to 1.8 cm in diameter.  Small amount of free fluid noted in Morison's pouch.  IMPRESSION:  1.  Again noted multiple mobile gallstones within gallbladder.  No thickening of the gallbladder wall or sonographic Murphy's sign. 2.  Normal CBD. 3.  No hydronephrosis or diagnostic renal calculus. 4.  Again noted fatty infiltration of the liver.  Original Report Authenticated By: Natasha Mead, M.D.    Anti-infectives: Anti-infectives     Start     Dose/Rate Route Frequency Ordered Stop   02/01/12 1800   imipenem-cilastatin (PRIMAXIN) 500 mg in sodium chloride 0.9 % 100 mL IVPB        500 mg 200 mL/hr over 30 Minutes  Intravenous 4 times per day 02/01/12 1650     02/01/12 0901   cefOXitin (MEFOXIN) 1 g in dextrose 5 % 50 mL IVPB  Status:  Discontinued        1 g 100 mL/hr over 30 Minutes Intravenous 60 min pre-op 02/01/12 0901 02/01/12 0913   02/01/12 0500   cefOXitin (MEFOXIN) 1 g in dextrose 5 % 50 mL IVPB  Status:  Discontinued        1 g 100 mL/hr over 30  Minutes Intravenous 60 min pre-op 01/31/12 1343 01/31/12 2152   01/31/12 1400   piperacillin-tazobactam (ZOSYN) IVPB 3.375 g  Status:  Discontinued        3.375 g 12.5 mL/hr over 240 Minutes Intravenous 3 times per day 01/31/12 1152 02/01/12 1630   01/31/12 0100   Ampicillin-Sulbactam (UNASYN) 3 g in sodium chloride 0.9 % 100 mL IVPB  Status:  Discontinued        3 g 100 mL/hr over 60 Minutes Intravenous Every 6 hours 01/31/12 0042 01/31/12 1152   01/30/12 1700   cefTRIAXone (ROCEPHIN) 1 g in dextrose 5 % 50 mL IVPB        1 g 100 mL/hr over 30 Minutes Intravenous  Once 01/30/12 1654 01/30/12 1824          Assessment/Plan: s/p Procedure(s): LAPAROSCOPIC CHOLECYSTECTOMY WITH INTRAOPERATIVE CHOLANGIOGRAM Advance diet Patient can be discharged at anytime from our standpoint.  LOS: 3 days   Marta Lamas. Gae Bon, MD, FACS [phone] [facility] Surgery 02/02/2012

## 2012-02-02 NOTE — Progress Notes (Signed)
Sent notification to pharmacy for missing dose of vesanoid via epic.  Called pharmacy as well and was told medication would be tubed to the floor shortly.  Reported to [place] at shift change.

## 2012-02-02 NOTE — Progress Notes (Signed)
FMTS Attending Note Patient seen and examined by me today, discussed with resident team.  Patient blood culture grew Klebsiella pneumoniae.  Since changing to imipenem she has had improvement in tachycardia and no further fever spikes.  Concern for nidus at port-a-cath.  To coordinate with her oncologist regarding plan for chemotx, would prefer to remove hardware while treating for Klebsiella bacteremia and then replace port-a-cath afterward.  Paula Compton, MD

## 2012-02-02 NOTE — Progress Notes (Signed)
Daily Progress Note Si Raider. Clinton Sawyer, M.D., M.B.A  Family Medicine PGY-1 Pager [phone]  Subjective:  The patient is sleeping comfortably today as I enter the room. 1. Febrile illness - denies subjective fever 2. Would like to eat, denies n/v/d 3. Ab pain - minimal pain at incision sites   Objective: Vital signs in last 24 hours: Temp:  [97.8 F (36.6 C)-103 F (39.4 C)] 98.6 F (37 C) (05/14 0727) Pulse Rate:  [99-119] 99  (05/14 0727) Resp:  [22-39] 30  (05/14 0727) BP: (111-143)/(56-106) 125/76 mmHg (05/14 0727) SpO2:  [94 %-100 %] 97 % (05/14 0727) Weight change:  Last BM Date: 01/30/12  Intake/Output from previous day: 05/13 0701 - 05/14 0700 In: 3213 [I.V.:1963; IV Piggyback:1250] Out: 3075 [Urine:3075] Intake/Output this shift: Total I/O In: 440 [P.O.:240; I.V.:200] Out: 1000 [Urine:1000]  Physical Exam: Gen: young AAF; morbidly obese; non distressed; very pleasant HEENT: PERRLA, EOMI, OP clear and dry, no cervical L/A Cardiac: RRR, no murmur Lungs: CTA-B Abd: protubterant; 4 healing incisions Extermities: non-tender, non edematous Skin: warm and moist   Lab Results:  Basename 02/01/12 0406 02/01/12 0055  WBC 11.3* 10.9*  HGB 9.0* 9.4*  HCT 26.2* 27.4*  PLT 315 291   BMET  Basename 02/02/12 0410 02/01/12 2100  NA 136 137  K 3.2* 3.4*  CL 103 105  CO2 21 22  GLUCOSE 154* 189*  BUN 4* 4*  CREATININE 0.54 0.54  CALCIUM 9.2 9.1    Studies/Results:  Blood Culture 5/11 Klebsiella pneumonia sensitive to cipro   Medications: I have reviewed the patient's current medications.  Assessment/Plan: 26 yo F with APML p/w fever, nausea, vomiting, abdominal pain, DKA; found to have cholecystitis and is s/p lap chole on 5/12; Sepsis 2/2 klebsiella pneumonia    # Gap acidosis: likely due to DKA; lactic acid 3.3. Initial gap (with corrected sodium) was 23; now resolved - AG @ 10 this AM to diet advanced, insulin drip stopped, D5 1/2 NS stopped - Will  f/u BMET in AM 5/15 as needed - likely transfer out of stepdown today   # Acute Cholecystitis - S/p Lap Cholecystectomy on 01/31/12 early AM (post op day #2  - continue pain control   # Sepsis: patient febrile with tachycardia and tachypnea from known bacteremia; antibiotic escalated to imipenem (from unasyn then zosyn); blood cx grew Klebsiella sensitive to cipro and patient afebrile since 15:30 no 5/13 - start Cipro 500 mg BID - stop imipenem - contact Heme-Onc about removal of port-a-cath   # HTN: currently elevated BP but likely associated with pain  - Continue home regimen of lisinopril 10mg    # Tachycardia: likely multifactorial- pain in addition to fever  - Monitor on telemetry  - IV hyration, control pain, reduce fever  - Consider BB if remains >130-140 despite control of other factors  # APML: being followed by Dr. Donnie Coffin  - NOT pancytopenic or neutropenic  - continue retinoic acid per heme-onc rec; appreciatvie of Heme-Onc consultation  # DM: elevated CBGs currently  - Will treat as above  - Restart home metformin once tolerating po (Cr is WNL at 0.55)   FEN/GI: D5 1/2 NS + K @ 100cc/hr; heart healthy diet once tolerating po + 2 1L NS boluses   Ppx: lovenox   Dispo: pending clinical improvement      LOS: 3 days   Mat Carne 02/02/2012, 9:48 AM

## 2012-02-02 NOTE — Progress Notes (Signed)
Report called to tiffany rn. Pt transferred to [id number] via w/c with belongings and husband. Melissa Washington

## 2012-02-02 NOTE — Progress Notes (Signed)
Nutrition Follow-up  Diet advanced to CHO-modified medium.  Consumed 100% of breakfast this morning.  Meds: Scheduled Meds:   . ciprofloxacin  500 mg Oral BID  . enoxaparin  40 mg Subcutaneous Q24H  . insulin aspart  0-20 Units Subcutaneous TID WC  . insulin aspart  4 Units Subcutaneous TID WC  . insulin glargine  35 Units Subcutaneous Daily  . lisinopril  10 mg Oral Daily  . magnesium sulfate 1 - 4 g bolus IVPB  2 g Intravenous Once  . polyethylene glycol  17 g Oral Daily  . potassium chloride  20 mEq Oral BID  . senna-docusate  1 tablet Oral QHS  . sodium chloride  3 mL Intravenous Q12H  . sodium chloride      . sodium chloride      . tretinoin  45 mg/m2/day Oral BID WC  . DISCONTD: imipenem-cilastatin  500 mg Intravenous Q6H  . DISCONTD: insulin glargine  35 Units Subcutaneous Once  . DISCONTD: insulin glargine  35 Units Subcutaneous QHS  . DISCONTD: piperacillin-tazobactam (ZOSYN)  IV  3.375 g Intravenous Q8H  . DISCONTD: sodium chloride  1,000 mL Intravenous Once   Continuous Infusions:   . 0.45 % NaCl with KCl 20 mEq / L 50 mL/hr at 02/02/12 1133  . insulin (NOVOLIN-R) infusion Stopped (02/02/12 0800)  . DISCONTD: sodium chloride 100 mL/hr at 02/01/12 1521  . DISCONTD: dextrose 5 % and 0.45 % NaCl with KCl 20 mEq/L 100 mL/hr at 02/01/12 2050   PRN Meds:.acetaminophen, acetaminophen, alum & mag hydroxide-simeth, dextrose, ketorolac, ondansetron (ZOFRAN) IV, oxyCODONE-acetaminophen, promethazine, DISCONTD:  HYDROmorphone (DILAUDID) injection, DISCONTD:  morphine injection  Labs:  CMP     Component Value Date/Time   NA 136 02/02/2012 0410   K 3.2* 02/02/2012 0410   CL 103 02/02/2012 0410   CO2 21 02/02/2012 0410   GLUCOSE 154* 02/02/2012 0410   BUN 4* 02/02/2012 0410   CREATININE 0.54 02/02/2012 0410   CALCIUM 9.2 02/02/2012 0410   PROT 6.7 02/02/2012 0410   ALBUMIN 2.8* 02/02/2012 0410   AST 47* 02/02/2012 0410   ALT 45* 02/02/2012 0410   ALKPHOS 100 02/02/2012 0410   BILITOT 1.4* 02/02/2012 0410   GFRNONAA >90 02/02/2012 0410   GFRAA >90 02/02/2012 0410   CBG (last 3)   Basename 02/02/12 1216 02/02/12 0756 02/02/12 0637  GLUCAP 146* 160* 146*     Intake/Output Summary (Last 24 hours) at 02/02/12 1226 Last data filed at 02/02/12 1200  Gross per 24 hour  Intake 3174.5 ml  Output   3575 ml  Net -400.5 ml    Weight Status:  129.2 kg (5/12)  Re-estimated needs, unchanged:  2000-2200 kcals, 100-125 grams protein, 2-2.2 liters fluid daily  Nutrition Dx:  Inadequate oral intake, ongoing.  Goal:  Intake to meet 90-100% of estimated nutrition needs, progressing.  Intervention:  Strawberry Ensure Complete PO once daily per patient request.  Monitor:  PO intake, labs, weight trend.   Hettie Holstein Pager #:  [phone]

## 2012-02-02 NOTE — Progress Notes (Signed)
Pt was admitted to room 4732 from 3300.  Pt placed a tele, VS WDL, and pt oriented to room.  Will carry out orders and continue to monitor.

## 2012-02-03 ENCOUNTER — Inpatient Hospital Stay (HOSPITAL_COMMUNITY): Payer: Medicaid Other

## 2012-02-03 ENCOUNTER — Encounter (HOSPITAL_COMMUNITY): Payer: Self-pay | Admitting: Radiology

## 2012-02-03 ENCOUNTER — Ambulatory Visit: Payer: Self-pay

## 2012-02-03 LAB — BASIC METABOLIC PANEL
BUN: 8 mg/dL (ref 6–23)
Chloride: 104 mEq/L (ref 96–112)
GFR calc Af Amer: >90 mL/min (ref >90)
Potassium: 3.5 mEq/L (ref 3.5–5.1)
Sodium: 138 mEq/L (ref 135–145)

## 2012-02-03 LAB — GLUCOSE, CAPILLARY: Glucose-Capillary: 209 mg/dL — ABNORMAL HIGH (ref 70–99)

## 2012-02-03 MED ORDER — FENTANYL CITRATE 0.05 MG/ML IJ SOLN
INTRAMUSCULAR | Status: AC | PRN
  Administered 2012-02-03: 25 ug via INTRAVENOUS
  Administered 2012-02-03: 50 ug via INTRAVENOUS

## 2012-02-03 MED ORDER — MIDAZOLAM HCL 2 MG/2ML IJ SOLN
INTRAMUSCULAR | Status: AC
  Filled 2012-02-03: qty 4

## 2012-02-03 MED ORDER — ENOXAPARIN SODIUM 40 MG/0.4ML ~~LOC~~ SOLN
40.0000 mg | SUBCUTANEOUS | Status: DC
  Filled 2012-02-03: qty 0.4

## 2012-02-03 MED ORDER — MIDAZOLAM HCL 5 MG/5ML IJ SOLN
INTRAMUSCULAR | Status: AC | PRN
  Administered 2012-02-03: 0.5 mg via INTRAVENOUS
  Administered 2012-02-03: 1 mg via INTRAVENOUS

## 2012-02-03 MED ORDER — CYCLOBENZAPRINE HCL 10 MG PO TABS: 5.0000 mg | ORAL_TABLET | Freq: Three times a day (TID) | ORAL | Status: DC | PRN

## 2012-02-03 MED ORDER — FENTANYL CITRATE 0.05 MG/ML IJ SOLN
INTRAMUSCULAR | Status: AC
  Filled 2012-02-03: qty 4

## 2012-02-03 MED ORDER — INSULIN PEN STARTER KIT
1.0000 | Freq: Once | Status: AC
  Administered 2012-02-04: 1
  Filled 2012-02-03 (×2): qty 1

## 2012-02-03 NOTE — Consult Note (Signed)
[facility] INPATIENT PROGRESS NOTE  Name: Melissa Washington      MRN: [id number]    Location: 4732/4732-01  Date: 02/03/2012 Time:8:05 PM   Subjective: Interval History:Melissa Washington reported that her abdominal pain has significantly improved compared to prior to lap cholecystectomy.  She only has mild discomfort at the surgical wounds without erythema, purulent discharge.  She only has low grade fever up to 100.40F last night.  She has been able to tolerate regular foods without problem.  She denies SOB, chest pain, bleeding symptoms, leg pain/swelling.   Objective: Vital signs in last 24 hours: Temp:  [99.3 F (37.4 C)-100.4 F (38 C)] 99.4 F (37.4 C) (05/15 1340) Pulse Rate:  [99-110] 99  (05/15 1610) Resp:  [19-38] 38  (05/15 1610) BP: (104-150)/(79-112) 146/92 mmHg (05/15 1610) SpO2:  [99 %-100 %] 100 % (05/15 1610) Weight:  [274 lb 14.4 oz (124.694 kg)] 274 lb 14.4 oz (124.694 kg) (05/15 0520)     PHYSICAL EXAM:  Gen: obese woman, no acute distress.   Eyes: No scleral icterus or jaundice. ENT: There was positive oral thrush. Neck was supple without thyromegaly. Lymphatics: Negative for cervical, supraclavicular, axillary, or inguinal adenopathy.  Respiratory: Lungs were clear bilaterally without wheezing or crackles. Cardiovascular: tachy but regular, S1/S2; without murmur, rubs, or gallop. There was no pedal edema. GI: Abdomen was soft, obese, nondistended, without organomegaly. There was no pain on palpation.  There was lap chole wounds which were well healed without erythema, purulent discharge.   There was no rebound tenderness or peritoneal sign.  Musculoskeletal exam: No spinal tenderness on palpation of vertebral spine. Skin exam was without ecchymosis, petechiae. Neuro exam was nonfocal.  Patient was alert and oriented. Attention was good. Language was appropriate. Mood was normal without depression. Speech was not pressured. Thought content was not tangential.      Studies/Results: Results for orders placed during the hospital encounter of 01/30/12 (from the past 48 hour(s))  GLUCOSE, CAPILLARY     Status: Abnormal   Collection Time   02/01/12  8:15 PM      Component Value Range Comment   Glucose-Capillary 175 (*) 70 - 99 (mg/dL)    Comment 1 Documented in Chart      Comment 2 Notify RN     BASIC METABOLIC PANEL     Status: Abnormal   Collection Time   02/01/12  9:00 PM      Component Value Range Comment   Sodium 137  135 - 145 (mEq/L)    Potassium 3.4 (*) 3.5 - 5.1 (mEq/L)    Chloride 105  96 - 112 (mEq/L)    CO2 22  19 - 32 (mEq/L)    Glucose, Bld 189 (*) 70 - 99 (mg/dL)    BUN 4 (*) 6 - 23 (mg/dL)    Creatinine, Ser 1.19  0.50 - 1.10 (mg/dL)    Calcium 9.1  8.4 - 10.5 (mg/dL)    GFR calc non Af Amer >90  >90 (mL/min)    GFR calc Af Amer >90  >90 (mL/min)   GLUCOSE, CAPILLARY     Status: Abnormal   Collection Time   02/01/12  9:20 PM      Component Value Range Comment   Glucose-Capillary 186 (*) 70 - 99 (mg/dL)   GLUCOSE, CAPILLARY     Status: Abnormal   Collection Time   02/01/12 10:35 PM      Component Value Range Comment   Glucose-Capillary 161 (*)  70 - 99 (mg/dL)   GLUCOSE, CAPILLARY     Status: Abnormal   Collection Time   02/01/12 11:40 PM      Component Value Range Comment   Glucose-Capillary 152 (*) 70 - 99 (mg/dL)   GLUCOSE, CAPILLARY     Status: Abnormal   Collection Time   02/02/12 12:40 AM      Component Value Range Comment   Glucose-Capillary 141 (*) 70 - 99 (mg/dL)   GLUCOSE, CAPILLARY     Status: Abnormal   Collection Time   02/02/12  1:41 AM      Component Value Range Comment   Glucose-Capillary 156 (*) 70 - 99 (mg/dL)   GLUCOSE, CAPILLARY     Status: Abnormal   Collection Time   02/02/12  2:44 AM      Component Value Range Comment   Glucose-Capillary 153 (*) 70 - 99 (mg/dL)   GLUCOSE, CAPILLARY     Status: Abnormal   Collection Time   02/02/12  3:44 AM      Component Value Range Comment    Glucose-Capillary 153 (*) 70 - 99 (mg/dL)   BASIC METABOLIC PANEL     Status: Abnormal   Collection Time   02/02/12  4:10 AM      Component Value Range Comment   Sodium 136  135 - 145 (mEq/L)    Potassium 3.2 (*) 3.5 - 5.1 (mEq/L)    Chloride 103  96 - 112 (mEq/L)    CO2 21  19 - 32 (mEq/L)    Glucose, Bld 154 (*) 70 - 99 (mg/dL)    BUN 4 (*) 6 - 23 (mg/dL)    Creatinine, Ser 1.61  0.50 - 1.10 (mg/dL)    Calcium 9.2  8.4 - 10.5 (mg/dL)    GFR calc non Af Amer >90  >90 (mL/min)    GFR calc Af Amer >90  >90 (mL/min)   HEPATIC FUNCTION PANEL     Status: Abnormal   Collection Time   02/02/12  4:10 AM      Component Value Range Comment   Total Protein 6.7  6.0 - 8.3 (g/dL)    Albumin 2.8 (*) 3.5 - 5.2 (g/dL)    AST 47 (*) 0 - 37 (U/L)    ALT 45 (*) 0 - 35 (U/L)    Alkaline Phosphatase 100  39 - 117 (U/L)    Total Bilirubin 1.4 (*) 0.3 - 1.2 (mg/dL)    Bilirubin, Direct 0.6 (*) 0.0 - 0.3 (mg/dL)    Indirect Bilirubin 0.8  0.3 - 0.9 (mg/dL)   MAGNESIUM     Status: Normal   Collection Time   02/02/12  4:10 AM      Component Value Range Comment   Magnesium 1.9  1.5 - 2.5 (mg/dL)   GLUCOSE, CAPILLARY     Status: Abnormal   Collection Time   02/02/12  4:46 AM      Component Value Range Comment   Glucose-Capillary 141 (*) 70 - 99 (mg/dL)   GLUCOSE, CAPILLARY     Status: Abnormal   Collection Time   02/02/12  5:36 AM      Component Value Range Comment   Glucose-Capillary 131 (*) 70 - 99 (mg/dL)   GLUCOSE, CAPILLARY     Status: Abnormal   Collection Time   02/02/12  6:37 AM      Component Value Range Comment   Glucose-Capillary 146 (*) 70 - 99 (mg/dL)   GLUCOSE, CAPILLARY  Status: Abnormal   Collection Time   02/02/12  7:56 AM      Component Value Range Comment   Glucose-Capillary 160 (*) 70 - 99 (mg/dL)    Comment 1 Documented in Chart      Comment 2 Notify RN     GLUCOSE, CAPILLARY     Status: Abnormal   Collection Time   02/02/12 12:16 PM      Component Value Range Comment    Glucose-Capillary 146 (*) 70 - 99 (mg/dL)    Comment 1 Documented in Chart      Comment 2 Notify RN     GLUCOSE, CAPILLARY     Status: Abnormal   Collection Time   02/02/12  4:44 PM      Component Value Range Comment   Glucose-Capillary 208 (*) 70 - 99 (mg/dL)   GLUCOSE, CAPILLARY     Status: Abnormal   Collection Time   02/02/12  8:55 PM      Component Value Range Comment   Glucose-Capillary 158 (*) 70 - 99 (mg/dL)    Comment 1 Notify RN      Comment 2 Documented in Chart     BASIC METABOLIC PANEL     Status: Abnormal   Collection Time   02/03/12  5:35 AM      Component Value Range Comment   Sodium 138  135 - 145 (mEq/L)    Potassium 3.5  3.5 - 5.1 (mEq/L)    Chloride 104  96 - 112 (mEq/L)    CO2 22  19 - 32 (mEq/L)    Glucose, Bld 157 (*) 70 - 99 (mg/dL)    BUN 8  6 - 23 (mg/dL)    Creatinine, Ser 1.47  0.50 - 1.10 (mg/dL)    Calcium 9.4  8.4 - 10.5 (mg/dL)    GFR calc non Af Amer >90  >90 (mL/min)    GFR calc Af Amer >90  >90 (mL/min)   GLUCOSE, CAPILLARY     Status: Abnormal   Collection Time   02/03/12  6:50 AM      Component Value Range Comment   Glucose-Capillary 160 (*) 70 - 99 (mg/dL)    Comment 1 Notify RN      Comment 2 Documented in Chart     GLUCOSE, CAPILLARY     Status: Abnormal   Collection Time   02/03/12 11:20 AM      Component Value Range Comment   Glucose-Capillary 209 (*) 70 - 99 (mg/dL)    Comment 1 Notify RN      Comment 2 Documented in Chart      Ir Removal Gap Inc W/o Fl Mod Sed  02/03/2012  *RADIOLOGY REPORT*  Clinical Data:  Bacteremia  REMOVAL OF IMPLANTED TUNNELED PORT-A-CATH  Intravenous medications: Fentanyl 75 mcg IV; Versed 1.5 mg IV; The patient is admitted to the hospital, currently on intravenous antibiotics.  Sedation time: 20 minutes  Fluoroscopy time: None  Complications: None immediate  Findings / Procedure:  Informed written consent  Informed written consent was obtained from the patient after a discussion of the risk,  benefits and alternatives to the procedure. The patient was positioned supine on the fluoroscopy table and the right chest Port-A-Cath site was prepped with chlorhexidine.  A sterile gown and gloves were worn during the procedure.  Local anesthesia was provided with 1% lidocaine with epinephrine.  A timeout was performed prior to the initiation of the procedure.  An incision was made overlying  the Port-A-Cath with a #15 scalpel. Utilizing sharp and blunt dissection, the Port-A-Cath was removed completely. The pocket was inspected and was negative for obvious sign of infection. Per request from clinical team, the catheter tip was submitted for culture.  The pocked was irrigated with sterile saline.  Wound closure was performed with subcutaneous 3-0 Monocryl, subcuticular 4-0 Vicryl and Dermabond.  A dressing was placed.  The patient tolerated the procedure well without immediate post procedural complication.  IMPRESSION:  1.  Successful removal of implanted Port-A-Cath.  No obvious signs of infection.  2.  Catheter tip was sent to the laboratory for culture.  Original Report Authenticated By: Waynard Reeds, M.D.     MEDICATIONS:  Reviewed.     Assessment/Plan:  1.  DM-type II; presented with DKA:  She was on insulin protocol per primary team and now on Lantus.   2.  Fever, Klebsiella sepsis physiology:  Most likely source from gallbladder.  But I agree with primary team to remove porta cath to ensure complete resolution of her bacteremia.  She is now on Cipro.   3.  Recent diagnosis of APML:   - she is currently on induction chemotherapy with arsenic and retinoic acid.  Last dose of arsenic was given on 01/15/2012.   Further administration of arsenic has been on hold due to recurrent admissions for fever, abdominal pain. - I recommended to continue retinoic acid (Vesanoid) to help getting patient into remission.  This medication works by promoting differentiation of promyelocytes into mature  neutrophils.  It does not cause pancytopenia.  She is at low risk of dedifferentiation since she started on induction chemo in March 2013.  She needs to be on Vesanoid upon discharge.  She has home supply of this.    4.  Abdominal pain due to cholecystitis:   Improved s/p lap chole.     FOLLOW UP:  Please cc: the discharge note to Dr. Donnie Coffin so that he can arrange for her to follow up with him at the [facility].   Thank you for this referral.  Please call [phone] if further question.  I will sign off for now.

## 2012-02-03 NOTE — Progress Notes (Signed)
Patient ID: Melissa Washington, female   DOB: [birth year], 26 y.o.   MRN: [id number]  I attempted to contact patient's primary oncologist, Dr. Donnie Coffin, regarding removal of PAC due to blood cultures positive for Klebsiella.  Since he is out of the office, I spoke to the on-call oncologist who is covering for him.  He agrees that [facility] be removed at this time and he will inform Dr. Donnie Coffin when he returns.    DE LA CRUZ,Ciara Kagan 02/03/12 @ 1:08 PM

## 2012-02-03 NOTE — Progress Notes (Signed)
Patient ID: Melissa Washington, female   DOB: 1986-06-28, 26 y.o.   MRN: [id number] 3 Days Post-Op  Subjective: Pt feels well.  Minimal abdominal soreness.  Tolerating diet.  Objective: Vital signs in last 24 hours: Temp:  [97.9 F (36.6 C)-100.4 F (38 C)] 99.3 F (37.4 C) (05/15 0520) Pulse Rate:  [104-110] 110  (05/15 0520) Resp:  [22-23] 22  (05/15 0520) BP: (119-150)/(79-95) 119/79 mmHg (05/15 0520) SpO2:  [97 %-100 %] 100 % (05/15 0520) Weight:  [274 lb 14.4 oz (124.694 kg)-276 lb 3.8 oz (125.3 kg)] 274 lb 14.4 oz (124.694 kg) (05/15 0520) Last BM Date: 02/02/12  Intake/Output from previous day: 05/14 0701 - 05/15 0700 In: 1665.7 [P.O.:1060; I.V.:601.7; IV Piggyback:4] Out: 1500 [Urine:1500] Intake/Output this shift: Total I/O In: -  Out: 500 [Urine:500]  PE: Abd: soft, minimally tender, +BS, ND, incisions c/d/i  Lab Results:   Basename 02/01/12 0406 02/01/12 0055  WBC 11.3* 10.9*  HGB 9.0* 9.4*  HCT 26.2* 27.4*  PLT 315 291   BMET  Basename 02/03/12 0535 02/02/12 0410  NA 138 136  K 3.5 3.2*  CL 104 103  CO2 22 21  GLUCOSE 157* 154*  BUN 8 4*  CREATININE 0.63 0.54  CALCIUM 9.4 9.2   PT/INR  Basename 01/31/12 1400  LABPROT 17.7*  INR 1.43   CMP     Component Value Date/Time   NA 138 02/03/2012 0535   K 3.5 02/03/2012 0535   CL 104 02/03/2012 0535   CO2 22 02/03/2012 0535   GLUCOSE 157* 02/03/2012 0535   BUN 8 02/03/2012 0535   CREATININE 0.63 02/03/2012 0535   CALCIUM 9.4 02/03/2012 0535   PROT 6.7 02/02/2012 0410   ALBUMIN 2.8* 02/02/2012 0410   AST 47* 02/02/2012 0410   ALT 45* 02/02/2012 0410   ALKPHOS 100 02/02/2012 0410   BILITOT 1.4* 02/02/2012 0410   GFRNONAA >90 02/03/2012 0535   GFRAA >90 02/03/2012 0535   Lipase     Component Value Date/Time   LIPASE 53 01/31/2012 1100       Studies/Results: No results found.  Anti-infectives: Anti-infectives     Start     Dose/Rate Route Frequency Ordered Stop   02/02/12 1100   ciprofloxacin  (CIPRO) tablet 500 mg        500 mg Oral 2 times daily 02/02/12 0944     02/01/12 1800   imipenem-cilastatin (PRIMAXIN) 500 mg in sodium chloride 0.9 % 100 mL IVPB  Status:  Discontinued        500 mg 200 mL/hr over 30 Minutes Intravenous 4 times per day 02/01/12 1650 02/02/12 0944   02/01/12 0901   cefOXitin (MEFOXIN) 1 g in dextrose 5 % 50 mL IVPB  Status:  Discontinued        1 g 100 mL/hr over 30 Minutes Intravenous 60 min pre-op 02/01/12 0901 02/01/12 0913   02/01/12 0500   cefOXitin (MEFOXIN) 1 g in dextrose 5 % 50 mL IVPB  Status:  Discontinued        1 g 100 mL/hr over 30 Minutes Intravenous 60 min pre-op 01/31/12 1343 01/31/12 2152   01/31/12 1400   piperacillin-tazobactam (ZOSYN) IVPB 3.375 g  Status:  Discontinued        3.375 g 12.5 mL/hr over 240 Minutes Intravenous 3 times per day 01/31/12 1152 02/01/12 1630   01/31/12 0100   Ampicillin-Sulbactam (UNASYN) 3 g in sodium chloride 0.9 % 100 mL IVPB  Status:  Discontinued  3 g 100 mL/hr over 60 Minutes Intravenous Every 6 hours 01/31/12 0042 01/31/12 1152   01/30/12 1700   cefTRIAXone (ROCEPHIN) 1 g in dextrose 5 % 50 mL IVPB        1 g 100 mL/hr over 30 Minutes Intravenous  Once 01/30/12 1654 01/30/12 1824           Assessment/Plan  1. S/p lap chole  Plan: 1. Ok for discharge from surgical standpoint.  She can follow up in 2-3 weeks in our office.  Please call as needed.   LOS: 4 days    Cristiana Yochim E 02/03/2012

## 2012-02-03 NOTE — H&P (Signed)
Chief Complaint: Bacteremia HPI: Melissa Washington is an 26 y.o. female who had a port placed about 3 and a half weeks ago for leukemia. She had only got to use it a few times. She has had recurrent admissions for abd pain with high fevers. This admission, she was found to have positive blood cultures for Klebsiella and also had cholecystectomy to treat her abd pain. She is doing well from that, but she still has some mild fevers and primary team has spoken with Oncology. Request to remove port given bacteremia and fevers. Intent to treat acute process then place new port to resume chemotherapy.  Past Medical History:  Past Medical History  Diagnosis Date  . Diabetes mellitus   . Hypertension   . Nephrolithiasis   . 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 Hepatic steatosis 01/17/2012  . Cholelithiasis 01/17/2012    Past Surgical History:  Past Surgical History  Procedure Date  . Portacath placement     [facility]  . Cholecystectomy 01/31/2012    Procedure: LAPAROSCOPIC CHOLECYSTECTOMY WITH INTRAOPERATIVE CHOLANGIOGRAM;  Surgeon: Clovis Pu. Cornett, MD;  Location: [facility] OR;  Service: General;  Laterality: N/A;    Family History:  Family History  Problem Relation Age of Onset  . Kidney disease Mother   . Kidney disease Maternal Grandmother   . Diabetes Maternal Grandmother   . Hypertension Mother   . Diabetes Father     Social History:  reports that she has never smoked. She does not have any smokeless tobacco history on file. She reports that she does not drink alcohol or use illicit drugs.  Allergies:  Allergies  Allergen Reactions  . Ultram (Tramadol Hcl) Nausea And Vomiting    Medications: Medications Prior to Admission  Medication Sig Dispense Refill  . ibuprofen (ADVIL) 800 MG tablet Take 1 tablet (800 mg total) by mouth every 8 (eight) hours as needed for pain.  30 tablet  0  . lisinopril (PRINIVIL,ZESTRIL) 10 MG tablet Take 1 tablet (10 mg total) by mouth daily.   30 tablet  4  . magnesium chloride (SLOW-MAG) 64 MG TBEC Take 2 tablets by mouth daily.      . metFORMIN (GLUCOPHAGE) 1000 MG tablet Take 1 tablet (1,000 mg total) by mouth daily with breakfast.  90 tablet  4  . promethazine (PHENERGAN) 12.5 MG tablet Take 12.5 mg by mouth every 8 (eight) hours as needed. For nausea      . tretinoin (VESANOID) 10 MG capsule Take 50 mg by mouth 2 (two) times daily.        Please HPI for pertinent positives, otherwise complete 10 system ROS negative.  Blood pressure 119/79, pulse 110, temperature 99.3 F (37.4 C), temperature source Oral, resp. rate 22, height 5\' 2"  (1.575 m), weight 274 lb 14.4 oz (124.694 kg), last menstrual period 12/29/2011, SpO2 100.00%. Body mass index is 50.28 kg/(m^2).   General Appearance:  Alert, cooperative, no distress, appears stated age  Head:  Normocephalic, without obvious abnormality, atraumatic  ENT: Unremarkable  Neck: Supple, symmetrical, trachea midline, no adenopathy, thyroid: not enlarged, symmetric, no tenderness/mass/nodules  Lungs:   Clear to auscultation bilaterally, no w/r/r, respirations unlabored without use of accessory muscles.  Chest Wall:  No tenderness or deformity at Rt port site.  Heart:  Regular rate and rhythm, S1, S2 normal, no murmur, rub or gallop. Carotids 2+ without bruit.  Abdomen:   Soft, non-tender, non distended. Bowel sounds active all four quadrants,  no  masses, no organomegaly. Surgery scars c/w recent chole.  Neurologic: Normal affect, no gross deficits.    Labs: CBC    Component Value Date/Time   WBC 11.3* 02/01/2012 0406   RBC 3.01* 02/01/2012 0406   HGB 9.0* 02/01/2012 0406   HCT 26.2* 02/01/2012 0406   PLT 315 02/01/2012 0406   BMET    Component Value Date/Time   NA 138 02/03/2012 0535   K 3.5 02/03/2012 0535   CL 104 02/03/2012 0535   CO2 22 02/03/2012 0535   GLUCOSE 157* 02/03/2012 0535   BUN 8 02/03/2012 0535   CREATININE 0.63 02/03/2012 0535   CALCIUM 9.4 02/03/2012 0535    GFRNONAA >90 02/03/2012 0535   GFRAA >90 02/03/2012 0535    Prothrombin Time/INR: 17.7/1.43  PTT: 35   Assessment/Plan Bacteremia-Klebsiella pos Bld Cx Leukemia--D/W primary team, will remove port today. Allow acute infectious process to resolve. Then can schedule new port placement with IR, easier to do at [facility]. Discussed procedure with pt including risks and complications. Will make NPO as she ate a small amt this am, plan for this afternoon. No Lovenox today. Will send tip for culture.  Brayton El PA-C 02/03/2012, 10:36 AM

## 2012-02-03 NOTE — Progress Notes (Signed)
Family Medicine Teaching Service Daily Resident Note   Patient name: Melissa Washington Medical record number: [id number] Date of birth: [birth year] Age: 26 y.o. Gender: Melissa Length of Stay:  LOS: 4 days             SUBJECTIVE & OVERNIGHT EVENTS  Pt reports intermittent head still.  Sharp in nature radiating from posterior occiput anteriorly.  Non-throbbing.  +Photophobia.  No neck stiffness. On antipyretics overnight with TMax of 100.4oF            OBJECTIVE  Vitals: Patient Vitals for the past 24 hrs:  BP Temp Temp src Pulse Resp SpO2 Height Weight  02/03/12 1340 104/84 mmHg 99.4 F (37.4 C) Oral 106  19  99 % - -  02/03/12 0520 119/79 mmHg 99.3 F (37.4 C) Oral 110  22  100 % - 274 lb 14.4 oz (124.694 kg)  02/02/12 2226 129/89 mmHg 100.4 F (38 C) Oral 104  22  100 % - -  02/02/12 1500 150/83 mmHg 99.6 F (37.6 C) Oral 106  22  97 % 5\' 2"  (1.575 m) 276 lb 3.8 oz (125.3 kg)   Wt Readings from Last 3 Encounters:  02/03/12 274 lb 14.4 oz (124.694 kg)  02/03/12 274 lb 14.4 oz (124.694 kg)  01/24/12 274 lb 14.6 oz (124.7 kg)    Intake/Output Summary (Last 24 hours) at 02/03/12 1431 Last data filed at 02/03/12 1421  Gross per 24 hour  Intake    750 ml  Output   1725 ml  Net   -975 ml    PE: GENERAL:  Obese young adult AA Melissa.  Examined in [facility] 4700.  Laying in bed  In no discomfort; norespiratory distress.   PSYCH: Alert and appropriately interactive; Insight:Good   H&N: AT/[place], MMM, no scleral icterus, EOMi THORAX: R port-a-cath in place without signs of superficial cellulitis HEART: RRR, S1/S2 heard, no murmur LUNGS: CTA B, no wheezes, no crackles ABDOMEN:  Obese, +BS, soft, non-tender, no rigidity, no guarding, no masses/organomegaly EXTREMITIES: Moves all 4 extremities spontaneously, warm well perfused, no edema, bilateral DP and PT pulses 2/4.   >MSK/Osteopathic:  CERVICAL  TTP over L cervical paraspinals; L rotation with R rotational restriction; TTP over L SCM and  L Scalenes TTP;  TTP over trapezius B  >NEURO: Negative brudenski and kernig; UE strength 5+/5 in B myotomes  LABS:  Lab 02/01/12 0406 02/01/12 0055 01/31/12 0355 01/30/12 1505  WBC 11.3* 10.9* 20.2* 14.4*  HGB 9.0* 9.4* 10.2* 10.5*  HCT 26.2* 27.4* 28.9* 29.8*  PLT 315 291 352 392    Lab 02/03/12 0535 02/02/12 0410 02/01/12 2100 02/01/12 1929 02/01/12 1453 02/01/12 1155 02/01/12 0406 02/01/12 0055 01/31/12 0355 01/30/12 2019  NA 138 136 137 135 134* 133* 136136 136 137 136  K 3.5 3.2* 3.4* 3.6 3.3* 3.6 3.83.6 3.9 3.3* 3.4*  CL 104 103 105 104 102 102 104104 103 101 104   CO2 22 21 22 20  18* 18* 1919 20 18* 18*  BUN 8 4* 4* 5* 4* 5* 66 7 5* 8  CREATININE 0.63 0.54 0.54 0.60 0.64 0.62 0.680.68 0.69 0.57 0.55  CALCIUM 9.4 9.2 9.1 9.0 9.1 9.3 9.19.0 9.0 9.3 9.1  GLUCOSE 157* 154* 189* 187* 141* 173* 165*163* 213* 225* 206*    Metabolic:  Lab 02/02/12 2055 02/02/12 1644 02/02/12 1216 02/02/12 0756 02/02/12 0637 02/02/12 0536 02/02/12 0446 02/02/12 0344 02/02/12 0244 02/02/12 0141  GLUCAP 158* 208* 146* 160* 146* 131* 141* 153* 153*  156*   No results found for this basename: HGBA1C:3 in the last 168 hours  Lab 02/02/12 0410 02/01/12 1453  MG 1.9 1.4*  PHOS -- 2.1*    Lab 02/02/12 0410 02/01/12 1453 02/01/12 0406 01/30/12 1459  ALT 45* 40* 35 23  AST 47* 45* 46* 13  ALKPHOS 100 91 101 106  BILITOT 1.4* 1.5* 1.0 0.5  BILIDIR 0.6* 0.6* -- --    Lab 02/02/12 0410 02/01/12 1453 02/01/12 0406  PROT 6.7 6.7 6.8  ALBUMIN 2.8* 2.7* 2.9*  PREALBUMIN -- -- --    Hematology:  Lab 01/30/12 1505  LYMPHSABS 1.7  MONOABS 0.8  EOSABS 0.1  BASOSABS 0.0    Lab 02/01/12 0406 02/01/12 0055 01/31/12 0355  RDW 15.4 15.5 15.1  MCV 87.0 87.0 85.3  MCHC 34.4 34.3 35.3  MRET -- -- --    Lab 01/31/12 1400  INR 1.43  APTT 35   Other Labs:   Lab 01/31/12 1100  AMYLASE --  LIPASE 53    Lab 01/30/12 1500  LATICACIDVEN 3.3*  PROCALCITON --    Lab 01/30/12 1349    PREGTESTUR NEGATIVE  PREGSERUM --  HCG --  HCGQUANT --   Urinalysis  Lab 01/30/12 1346  COLORURINE YELLOW  APPEARANCEUR CLEAR  LABSPEC 1.020  PHURINE 5.5  GLUCOSEU >1000*  HGBUR NEGATIVE  BILIRUBINUR NEGATIVE  KETONESUR NEGATIVE  PROTEINUR NEGATIVE  UROBILINOGEN 0.2  NITRITE NEGATIVE  LEUKOCYTESUR TRACE*    IMAGING: (in last 48 Hours) No results found.  MEDS: I have reviewed all medications and made changes as below.  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, nausea, vomiting, abdominal pain, DKA; found to have cholecystitis and is s/p lap chole on 5/12; Sepsis 2/2 klebsiella pneumonia    # SEPSIS: (Bacteremia, UTI, Acute Cholecystitis) - RESOLVED   - patient febrile with tachycardia and tachypnea from known bacteremia; antibiotic escalated to imipenem (from unasyn then zosyn); blood cx grew Klebsiella sensitive to cipro and patient afebrile since 15:30 no 5/13        [facility] Course  *Cipro 500mg  po BID   Continued to have high fevers and ABX were broadened to imipenem  5/11 - S/p Emergent Cholecystectomy  Improving and speciation returned; narrowed to CIPRO  Discussed with Heme/ONC will need OP replacement of Port-a-cath but agreed to removal        Plan / Followup   Remove Port-a-cath today via IR; will need replacement as OP for continued chemo.   # METABOLIC: (Anion GAP Acidosis - likely due to DKA, Type II Diabetes Uncontrolled)   - pt with known type II diabetes - sugar found to be > 400 on admission - lactic acid initially 3.3 - initial gap = 23       [facility] Course  *Lantus 35Units *SSI  Started on Insulin Drip; briefly off on day 1 of hospitalization but gap returned and insulin drip  restarted.    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  - NOT pancytopenic or neutropenic  - continue retinoic acid per heme-onc rec; appreciatvie of Heme-Onc consultation  # Headache: ([place] Headache vs Medication withdrawal vs migraine)   - Pt with hx of occasional headaches at home but new photophobia       Hospital Meds      Hospital Course  *Toradol *Flexeril *Tylenol  Pt with complaints of L > R sided headache; non throbbing but + photophobia  Evidence of tension components on exam        Plan / Followup   Muscle relaxer Plan to avoid opioids as may be precipitating withdrawal headaches Ensure adequate hydration   # Abdominal Pain: Cholilithasis with Acute Cholecystitis   - pt with 3rd admission for Abodminal pain in 3 weeks; developed fevers, chills and leukocytosis       [facility] Course  *Percocet  5/11 - S/p Emergent Cholecystectomy        Plan / Followup   Surgery to follow as OP in 3 weeks   # HTN: currently elevated BP but likely associated with pain  - Continue home regimen of lisinopril 10mg    # Tachycardia: likely multifactorial- pain in addition to fever - improved - Monitor on telemetry  - IV hyration, control pain, reduce fever  - Consider BB if remains >130-140 despite control of other factors   #Hypokalemia - mg 1.9; improved today; continue supplemental K+   FEN/GI: D5 1/2 NS + K @ 50cc/hr; heart healthy diet once tolerating po Ppx: lovenox    Dispo: plan for d/c to home with follow up with Surgery and Heme/Onc.  Pt will need Port-a-cath replaced prior to starting chemo again.   Andrena Mews, DO [facility] Resident - PGY-1 02/03/2012 2:53 PM

## 2012-02-03 NOTE — Plan of Care (Signed)
PCP NOTE 7:54 AM Hal Norrington   Patient seen and examined. Agree with Inpatient team management.   Patient c/o: headache  Filed Vitals:   02/02/12 1146 02/02/12 1500 02/02/12 2226 02/03/12 0520  BP: 134/95 150/83 129/89 119/79  Pulse: 106 106 104 110  Temp: 97.9 F (36.6 C) 99.6 F (37.6 C) 100.4 F (38 C) 99.3 F (37.4 C)  TempSrc: Oral Oral Oral Oral  Resp: 23 22 22 22   Height:  5\' 2"  (1.575 m)    Weight:  276 lb 3.8 oz (125.3 kg)  274 lb 14.4 oz (124.694 kg)  SpO2: 99% 97% 100% 100%    PE: see Primary team note  A/P:  Problem list and management per primary team  1. Social visit - discussed care and comfort with patient - patient doing better s/p lap. Chole. - complains of headache.  - Will follow with you  Thank you for the excellent care of this patient.  Edd Arbour MD

## 2012-02-03 NOTE — Progress Notes (Signed)
Will sign off.  Patient doing well from surgical standpoint.  Marta Lamas. Gae Bon, MD, FACS [phone] [facility]

## 2012-02-03 NOTE — Procedures (Signed)
Successful removal of right anterior chest wall port-a-cath. No immediate post procedural complications.  

## 2012-02-03 NOTE — Progress Notes (Signed)
Inpatient Diabetes Program Recommendations  AACE/ADA: New Consensus Statement on Inpatient Glycemic Control (2009)  Target Ranges:  Prepandial:   less than 140 mg/dL      Peak postprandial:   less than 180 mg/dL (1-2 hours)      Critically ill patients:  140 - 180 mg/dL   Diabetes Coordinator attempted to visit patient to discuss insulin administration.  Patient gone to procedure.  Will attempt again tomorrow.  In the meantime the bedside RN will begin insulin teaching.    Thank you  Piedad Climes [facility] Inpatient Diabetes Coordinator [phone]

## 2012-02-03 NOTE — Progress Notes (Signed)
Nursing Note: Pt has been educated and has properly demonstrated insulin administration. Pt verbalizes understanding.Pt viewed insulin administration video. Will continue to monitor and educate pt.  Joshaua Epple Scientist, clinical (histocompatibility and immunogenetics).

## 2012-02-04 ENCOUNTER — Ambulatory Visit: Payer: Self-pay

## 2012-02-04 LAB — GLUCOSE, CAPILLARY
Glucose-Capillary: 140 mg/dL — ABNORMAL HIGH (ref 70–99)
Glucose-Capillary: 149 mg/dL — ABNORMAL HIGH (ref 70–99)
Glucose-Capillary: 172 mg/dL — ABNORMAL HIGH (ref 70–99)

## 2012-02-04 MED ORDER — INSULIN GLARGINE 100 UNIT/ML ~~LOC~~ SOLN: 35.0000 [IU] | Freq: Every day | SUBCUTANEOUS | Status: DC

## 2012-02-04 MED ORDER — CIPROFLOXACIN HCL 500 MG PO TABS: 500.0000 mg | ORAL_TABLET | Freq: Two times a day (BID) | ORAL | Status: AC

## 2012-02-04 NOTE — Progress Notes (Signed)
Pt dishcarged per W/C with all belongings and diabetic teaching material and all D/C information. All personal belongings with pt. Accompanied by Nurse tech and pt's boyfriend  T Print production planner

## 2012-02-04 NOTE — Progress Notes (Signed)
FMTS Attending Note  Patient seen and examined by me, she is comfortable and is asking to be discharged.  She has remained afebrile for 36 hours, and she has not had the tachycardia spikes that she had been having when febrile.  Is tolerating oral antibiotics (Cipro) for treatment of her Klebsiella bacteremia.  Is eating and drinking normally, describes minimal abdominal soreness following her cholecystectomy. Plan to discharge to home, with basal insulin as her mainstay of DM management.  DM education has been ordered to take place before discharge.  For 14 day course of antibiotics for treatment of her bacteremia.  She is to make appointment with her oncologist, Dr. Donnie Coffin, regarding timing of replacement of her port-a-cath, which was removed yesterday. Paula Compton, MD

## 2012-02-04 NOTE — Plan of Care (Signed)
Problem: Diagnosis - Type of Surgery Goal: General Surgical Patient Education (See Patient Education module for education specifics)  Outcome: Completed/Met Date Met:  02/04/12 Discarge info went over with pt from [facility] post op care of wounds and when to call MD.

## 2012-02-04 NOTE — Progress Notes (Signed)
Inpatient Diabetes Program Recommendations  AACE/ADA: New Consensus Statement on Inpatient Glycemic Control (2009)  Target Ranges:  Prepandial:   less than 140 mg/dL      Peak postprandial:   less than 180 mg/dL (1-2 hours)      Critically ill patients:  140 - 180 mg/dL   Diabetes Coordinator visited patient to answer any questions or concerns.  Patient said she was comfortable giving herself insulin injections.  She was very familiar with the insulin pen but we went over the process of using it.  She did a return demonstration at bedside without difficulty.  She asked about getting a glucose meter.  I gave her the prescription that needs to be signed by the MD before discharge.  She can take it to the pharmacy that she uses and they will know which meter her Medicaid will cover.   Received an OP order for diabetes education but the patient states that she can not do it right now because she gets chemotherapy 5 days a week.  I will fax the order to the [facility] and they can speak with her and hopefully put her order on hold until she is able to attend classes.   Gave patient handout for hypoglycemia with signs and symptoms and how to treat.   Patient does not have any further questions or concerns at this time.  Thank you  Piedad Climes [facility] Inpatient Diabetes Coordinator [phone]

## 2012-02-04 NOTE — Plan of Care (Signed)
Problem: Consults Goal: Diabetes Guidelines if Diabetic/Glucose > 140 If diabetic or lab glucose is > 140 mg/dl - Initiate Diabetes/Hyperglycemia Guidelines & Document Interventions  Outcome: Completed/Met Date Met:  02/04/12 Pt has had much Diabetic teaching by RN's, Diabetic coordinator, and Dietician. Pt watched Diabetic videos, has handouts including sick day management. Going home on Lantus injections 1 time per day. Has Insulin starter kit and prescription for Home Blood Glucose monitor. Marisa Cyphers RN

## 2012-02-04 NOTE — Discharge Planning (Deleted)
Family Medicine Resident Discharge Summary  Patient ID: Melissa Washington [id number] 25 y.o. 06/20/1986  Admit date: 01/30/2012  Discharge date and time: No discharge date for patient encounter.   Admitting Physician: Wayne Andrew Hale, MD   Discharge Physician: James Breen, MD  Admission Diagnoses: Type II or unspecified type diabetes mellitus without mention of complication, not stated as uncontrolled [250.00] (DIABETES-TYPE 2) Cholelithiasis [574] Abdominal pain [789.0] Dehydration [276.51] DKA (diabetic ketoacidoses) [250.10] Leukemia [208.90] Urinary tract infection [599.0] Fever [780.60] Fever acute cholecystitis  Discharge Diagnoses: APML, GNR bacteremia and sepsis, DKA, Cholecystectomy  Admission Condition: poor  Discharged Condition: good  Indication for Admission: DKA, sepsis  Hospital Course:   Sepsis: Admitted in GNR sepsis confirmed w/ cultures. Consulted CCM who felt that pt did not need CCM admission. Continued to have high fevers and ABX were broadened to imipenem.  5/11 - S/p Emergent Cholecystectomy  Improving and speciation returned; narrowed to CIPRO  Discussed with Heme/ONC will need OP replacement of Port-a-cath but agreed to removal  Pt afebrile for greater than 36hrs prior to discharge.  Cipro to continue as outpt for a total of 14 days.   DKA/Metabolic: Admitted in anion gap diabetic keto acidosis, w/ CBG of 334 Started on Insulin Drip; briefly off on day 1 of hospitalization but gap returned and insulin drip restarted. Gap subsequently closed and pt transtioned to insulin regimen as below w/ excellent glucose control. CBG in the 150s primarily at time of discharge.   On Lantus, SSI and Meal coverage - plan to go home on Insulin  Adequately fluid resuscitated while in SDU  Underwent diabetic teaching and worked w/ diabetic coordinator Restarting Metformin at time of discharge  APML: Followed by Dr. Rubin. Not an active problem during  hospitalization Consult to Heme-Onc No pancytopenia or neutropenia.  Continued on Retinoic acid per Heme/Onc Will need port-a-cath replaced as this was removed during hosptalization due to concern for being possible infectious source  Cholelithiasis and acute cholecystitis: S/P cholecystectomy. pt with 3rd admission for Abodminal pain in 3 weeks; developed fevers, chills and leukocytosis  5/11 - S/P emergent cholecystectomy  Consults: pulmonary/intensive care and surgery, and Heme/Onc  Significant Diagnostic Studies:   5/10 < > Blood Cultures - Klebsiella X 1 - sensitive to CIPRO  5/10 < > Urine Cultures - >100,000 CFUs of Multiple morphotypes  5/13 < > Blood Cutlures X 2 >>> Negative at time of DC 5/15 < > Cath tip Cx >> negative at time of discharge   Further labs results below   Treatments: surgery: Laproscopic Cholecystectomy  Discharge Exam: General: NAD, obese, no distress CV: RRR, no m/r/g Res: CTAB, normal effort Abd: non-painful to palpation Skin: Laproscopic incision sites c/d/i Ext: 2+ pulses throughout  Disposition: home  Medication List  As of 02/04/2012  2:50 PM   START taking these medications         ciprofloxacin 500 MG tablet   Commonly known as: CIPRO   Take 1 tablet (500 mg total) by mouth 2 (two) times daily.      insulin glargine 100 UNIT/ML injection   Commonly known as: LANTUS   Inject 35 Units into the skin daily.         CONTINUE taking these medications         lisinopril 10 MG tablet   Commonly known as: PRINIVIL,ZESTRIL   Take 1 tablet (10 mg total) by mouth daily.      magnesium chloride 64 MG Tbec   Commonly known   as: SLOW-MAG      metFORMIN 1000 MG tablet   Commonly known as: GLUCOPHAGE   Take 1 tablet (1,000 mg total) by mouth daily with breakfast.      promethazine 12.5 MG tablet   Commonly known as: PHENERGAN      tretinoin 10 MG capsule   Commonly known as: VESANOID         STOP taking these medications          ibuprofen 800 MG tablet          Where to get your medications    These are the prescriptions that you need to pick up. We sent them to a specific pharmacy, so you will need to go there to get them.   [id number] - [place], [place] - [address]    [address] [place] [address]    Phone: [phone]        ciprofloxacin 500 MG tablet   insulin glargine 100 UNIT/ML injection              Patient Instructions: Activity: activity as tolerated and ambulate in house Diet: regular diet Wound Care: as directed  Follow-up with Dr. Orton on 02/10/12 08:15  Follow-up Items: 1. Cipro continued for a total of 14 days ABX 2. Diabetes management as pt now requiring insulin  Signed: [name], MD Family Medicine Resident PGY-1 [phone] 02/04/2012 2:50 PM    Lab  02/01/12 0406  02/01/12 0055  01/31/12 0355  01/30/12 1505   WBC  11.3*  10.9*  20.2*  14.4*   HGB  9.0*  9.4*  10.2*  10.5*   HCT  26.2*  27.4*  28.9*  29.8*   PLT  315  291  352  392     Lab  02/03/12 0535  02/02/12 0410  02/01/12 2100  02/01/12 1929  02/01/12 1453  02/01/12 1155  02/01/12 0406  02/01/12 0055  01/31/12 0355  01/30/12 2019   NA  138  136  137  135  134*  133*  136136  136  137  136   K  3.5  3.2*  3.4*  3.6  3.3*  3.6  3.83.6  3.9  3.3*  3.4*   CL  104  103  105  104  102  102  104104  103  101  104   CO2  22  21  22  20  18*  18*  1919  20  18*  18*   BUN  8  4*  4*  5*  4*  5*  66  7  5*  8   CREATININE  0.63  0.54  0.54  0.60  0.64  0.62  0.680.68  0.69  0.57  0.55   CALCIUM  9.4  9.2  9.1  9.0  9.1  9.3  9.19.0  9.0  9.3  9.1   GLUCOSE  157*  154*  189*  187*  141*  173*  165*163*  213*  225*  206*    Metabolic:   Lab  02/02/12 2055  02/02/12 1644  02/02/12 1216  02/02/12 0756  02/02/12 0637  02/02/12 0536  02/02/12 0446  02/02/12 0344  02/02/12 0244  02/02/12 0141   GLUCAP  158*  208*  146*  160*  146*  131*  141*   153*  153*  156*    No results found for this   basename: HGBA1C:3 in the last 168 hours   Lab  02/02/12 0410  02/01/12 1453   MG  1.9  1.4*   PHOS  --  2.1*     Lab  02/02/12 0410  02/01/12 1453  02/01/12 0406  01/30/12 1459   ALT  45*  40*  35  23   AST  47*  45*  46*  13   ALKPHOS  100  91  101  106   BILITOT  1.4*  1.5*  1.0  0.5   BILIDIR  0.6*  0.6*  --  --     Lab  02/02/12 0410  02/01/12 1453  02/01/12 0406   PROT  6.7  6.7  6.8   ALBUMIN  2.8*  2.7*  2.9*   PREALBUMIN  --  --  --    Hematology:   Lab  01/30/12 1505   LYMPHSABS  1.7   MONOABS  0.8   EOSABS  0.1   BASOSABS  0.0     Lab  02/01/12 0406  02/01/12 0055  01/31/12 0355   RDW  15.4  15.5  15.1   MCV  87.0  87.0  85.3   MCHC  34.4  34.3  35.3   MRET  --  --  --     Lab  01/31/12 1400   INR  1.43   APTT  35    Other Labs:   Lab  01/31/12 1100   AMYLASE  --   LIPASE  53     Lab  01/30/12 1500   LATICACIDVEN  3.3*   PROCALCITON  --     Lab  01/30/12 1349   PREGTESTUR  NEGATIVE   PREGSERUM  --   HCG  --   HCGQUANT  --    Urinalysis   Lab  01/30/12 1346   COLORURINE  YELLOW   APPEARANCEUR  CLEAR   LABSPEC  1.020   PHURINE  5.5   GLUCOSEU  >1000*   HGBUR  NEGATIVE   BILIRUBINUR  NEGATIVE   KETONESUR  NEGATIVE   PROTEINUR  NEGATIVE   UROBILINOGEN  0.2   NITRITE  NEGATIVE   LEUKOCYTESUR  TRACE*       

## 2012-02-04 NOTE — Discharge Instructions (Signed)
You were admitted to the hospital due to a serious infection, elevated blood sugar levels, and an inflammed gall bladder. You had your gallbladder removed, were placed on insulin, and given antibiotics. Please continue to take your antibiotics for the next 10 days. It is very important that you take all of these pills. Please continue taking the lantus 35units daily as well as your metformin. Please check your sugars at least 3 times daily. Call the clinic if your sugar is elevated above 200. Dr. Rivka Safer will help you make any further changes to your diabetes regimen. Please follow up with him on 02/10/12 at 08:15. Have a great day.     CC[facility] ______[facility] LAPAROSCOPIC SURGERY: POST OP INSTRUCTIONS Always review your discharge instruction sheet given to you by the facility where your surgery was performed. IF YOU HAVE DISABILITY OR FAMILY LEAVE FORMS, YOU MUST BRING THEM TO THE OFFICE FOR PROCESSING.   DO NOT GIVE THEM TO YOUR DOCTOR.  1. A prescription for pain medication may be given to you upon discharge.  Take your pain medication as prescribed, if needed.  If narcotic pain medicine is not needed, then you may take acetaminophen (Tylenol) or ibuprofen (Advil) as needed. 2. Take your usually prescribed medications unless otherwise directed. 3. If you need a refill on your pain medication, please contact your pharmacy.  They will contact our office to request authorization. Prescriptions will not be filled after 5pm or on week-ends. 4. You should follow a light diet the first few days after arrival home, such as soup and crackers, etc.  Be sure to include lots of fluids daily. 5. Most patients will experience some swelling and bruising in the area of the incisions.  Ice packs will help.  Swelling and bruising can take several days to resolve.  6. It is common to experience some constipation if taking pain medication after surgery.  Increasing fluid intake and taking a stool  softener (such as Colace) will usually help or prevent this problem from occurring.  A mild laxative (Milk of Magnesia or Miralax) should be taken according to package instructions if there are no bowel movements after 48 hours. 7. Unless discharge instructions indicate otherwise, you may remove your bandages 24-48 hours after surgery, and you may shower at that time.  You may have steri-strips (small skin tapes) in place directly over the incision.  These strips should be left on the skin for 7-10 days.  If your surgeon used skin glue on the incision, you may shower in 24 hours.  The glue will flake off over the next 2-3 weeks.  Any sutures or staples will be removed at the office during your follow-up visit. 8. ACTIVITIES:  You may resume regular (light) daily activities beginning the next day--such as daily self-care, walking, climbing stairs--gradually increasing activities as tolerated.  You may have sexual intercourse when it is comfortable.  Refrain from any heavy lifting or straining until approved by your doctor. a. You may drive when you are no longer taking prescription pain medication, you can comfortably wear a seatbelt, and you can safely maneuver your car and apply brakes. b. RETURN TO WORK:  __________________________________________________________ 9. You should see your doctor in the office for a follow-up appointment approximately 2-3 weeks after your surgery.  Make sure that you call for this appointment within a day or two after you arrive home to insure a convenient appointment time. 10. OTHER INSTRUCTIONS: __________________________________________________________________________________________________________________________ __________________________________________________________________________________________________________________________ WHEN TO CALL YOUR DOCTOR: 1. Fever  over 101.0 2. Inability to urinate 3. Continued bleeding from incision. 4. Increased pain, redness, or  drainage from the incision. 5. Increasing abdominal pain  The clinic staff is available to answer your questions during regular business hours.  Please don't hesitate to call and ask to speak to one of the nurses for clinical concerns.  If you have a medical emergency, go to the nearest emergency room or call 911.  A surgeon from [facility] is always on call at the hospital. [address], [place], [place]  [address] ? [address] [address], [place], [place]   [address] [phone] ? [phone] ? FAX [phone] Web site: [email or web address]

## 2012-02-05 ENCOUNTER — Encounter: Payer: Self-pay | Admitting: *Deleted

## 2012-02-05 ENCOUNTER — Telehealth: Payer: Self-pay | Admitting: *Deleted

## 2012-02-05 ENCOUNTER — Ambulatory Visit: Payer: Self-pay

## 2012-02-05 LAB — CULTURE, BLOOD (ROUTINE X 2)
Culture  Setup Time: 201305112057
Culture: NO GROWTH

## 2012-02-05 NOTE — Telephone Encounter (Signed)
ATTEMPT TO NOTIFY PT OF APPOINTMENTS FOR 02/08/12. UNSUCCESSFUL, PHONE NUMBERS ARE NOT IN WORKING ORDER

## 2012-02-07 LAB — CULTURE, BLOOD (ROUTINE X 2): Culture  Setup Time: 201305132139

## 2012-02-08 ENCOUNTER — Other Ambulatory Visit: Payer: Self-pay | Admitting: *Deleted

## 2012-02-08 ENCOUNTER — Ambulatory Visit: Payer: Medicaid Other

## 2012-02-08 ENCOUNTER — Telehealth: Payer: Self-pay | Admitting: *Deleted

## 2012-02-08 LAB — CULTURE, BLOOD (ROUTINE X 2)

## 2012-02-08 NOTE — Telephone Encounter (Signed)
Attempted to reach pt at this number Louise [phone]. To notify pt of chemo appts. Was told by Sallye Ober that she would give my number to pt and have her call this desk

## 2012-02-09 ENCOUNTER — Other Ambulatory Visit: Payer: Self-pay

## 2012-02-09 ENCOUNTER — Ambulatory Visit: Payer: Medicaid Other

## 2012-02-09 ENCOUNTER — Ambulatory Visit (HOSPITAL_BASED_OUTPATIENT_CLINIC_OR_DEPARTMENT_OTHER): Payer: Medicaid Other | Admitting: Oncology

## 2012-02-09 ENCOUNTER — Other Ambulatory Visit (HOSPITAL_BASED_OUTPATIENT_CLINIC_OR_DEPARTMENT_OTHER): Payer: Medicaid Other | Admitting: Lab

## 2012-02-09 ENCOUNTER — Other Ambulatory Visit: Payer: Self-pay | Admitting: Physician Assistant

## 2012-02-09 ENCOUNTER — Telehealth: Payer: Self-pay | Admitting: Oncology

## 2012-02-09 VITALS — BP 127/91 | HR 97 | Temp 98.5°F | Ht 62.0 in | Wt 263.7 lb

## 2012-02-09 DIAGNOSIS — C9240 Acute promyelocytic leukemia, not having achieved remission: Secondary | ICD-10-CM

## 2012-02-09 DIAGNOSIS — C9200 Acute myeloblastic leukemia, not having achieved remission: Secondary | ICD-10-CM

## 2012-02-09 LAB — CBC WITH DIFFERENTIAL/PLATELET
BASO%: 0.3 % (ref 0.0–2.0)
Eosinophils Absolute: 0.1 10*3/uL (ref 0.0–0.5)
HCT: 29.4 % — ABNORMAL LOW (ref 34.8–46.6)
LYMPH%: 20 % (ref 14.0–49.7)
MCHC: 33.8 g/dL (ref 31.5–36.0)
MCV: 87.9 fL (ref 79.5–101.0)
MONO#: 0.6 10*3/uL (ref 0.1–0.9)
MONO%: 6.2 % (ref 0.0–14.0)
NEUT%: 72.3 % (ref 38.4–76.8)
Platelets: 491 10*3/uL — ABNORMAL HIGH (ref 145–400)
WBC: 9.1 10*3/uL (ref 3.9–10.3)

## 2012-02-09 LAB — COMPREHENSIVE METABOLIC PANEL
BUN: 15 mg/dL (ref 6–23)
CO2: 20 mEq/L (ref 19–32)
Calcium: 10.3 mg/dL (ref 8.4–10.5)
Chloride: 105 mEq/L (ref 96–112)
Creatinine, Ser: 0.72 mg/dL (ref 0.50–1.10)

## 2012-02-09 LAB — MAGNESIUM: Magnesium: 1.8 mg/dL (ref 1.5–2.5)

## 2012-02-09 MED ORDER — "INSULIN SYRINGE-NEEDLE U-100 26G X 1/2"" 1 ML MISC": 35.0000 [IU] | Freq: Every morning | Status: DC

## 2012-02-09 NOTE — Telephone Encounter (Signed)
gve the pt her June 2013 appt calendar. Pt is aware she will be contacted with the port placement appt

## 2012-02-10 ENCOUNTER — Ambulatory Visit: Payer: Medicaid Other | Admitting: Family Medicine

## 2012-02-10 ENCOUNTER — Ambulatory Visit: Payer: Medicaid Other

## 2012-02-10 ENCOUNTER — Telehealth: Payer: Self-pay | Admitting: *Deleted

## 2012-02-10 NOTE — Telephone Encounter (Signed)
Received voicemail from [place] in radiology about this patient. I have forwarded the message to the desk RN.   JMW

## 2012-02-11 ENCOUNTER — Ambulatory Visit: Payer: Medicaid Other

## 2012-02-11 ENCOUNTER — Other Ambulatory Visit: Payer: Self-pay | Admitting: Radiology

## 2012-02-11 NOTE — Progress Notes (Signed)
Hematology and Oncology Follow Up Visit  Melissa Washington [id number] [birth year] 26 y.o. 02/11/2012 6:30 AM   DIAGNOSIS:   Encounter Diagnoses  Name Primary?  . APML (acute promyelocytic leukemia) Yes  . APL (acute promyelocytic leukemia)      PAST THERAPY: Arsenic trioxide given IV daily x 5 days , x 2 weeks ATRA orally    Interim History:  Dia was recently discharged from hospital after being admitted for the 2nd time with fever and abdominal pain. She was found to have klebsiella bacteremia as well as  Cholecystitis. She underwent a lapchole on 5/12 and was discharged fom hospital on 5/16. She has been on ATRA throughout and has received a 6 month supply from the drug company. She has been feeling well without fevers or pain. She is on po cipro for an additional 14 days.  Medications: I have reviewed the patient's current medications.  Allergies:  Allergies  Allergen Reactions  . Ultram (Tramadol Hcl) Nausea And Vomiting    Past Medical History, Surgical history, Social history, and Family History were reviewed and updated.  Review of Systems: Constitutional:  Negative for fever, chills, night sweats, anorexia, weight loss, pain. Cardiovascular: no chest pain or dyspnea on exertion Respiratory: no cough, shortness of breath, or wheezing Neurological: negative Dermatological: negative ENT: negative Skin Gastrointestinal: negative Genito-Urinary: negative Hematological and Lymphatic: negative Breast: negative Musculoskeletal: negative Remaining ROS negative.  Physical Exam:  Blood pressure 127/91, pulse 97, temperature 98.5 F (36.9 C), height 5\' 2"  (1.575 m), weight 263 lb 11.2 oz (119.614 kg), last menstrual period 12/29/2011.  ECOG: 0   General appearance: alert, cooperative and appears stated age Head: Normocephalic, without obvious abnormality, atraumatic Throat: lips, mucosa, and tongue normal; teeth and gums normal Resp: clear to auscultation  bilaterally Cardio: regular rate and rhythm, S1, S2 normal, no murmur, click, rub or gallop and normal apical impulse GI: soft, non-tender; bowel sounds normal; no masses,  no organomegaly Extremities: extremities normal, atraumatic, no cyanosis or edema Pulses: 2+ and symmetric Lymph nodes: Cervical, supraclavicular, and axillary nodes normal. Neurologic: Grossly normal   Lab Results: Lab Results  Component Value Date   WBC 9.1 02/09/2012   HGB 9.9* 02/09/2012   HCT 29.4* 02/09/2012   MCV 87.9 02/09/2012   PLT 491* 02/09/2012     Chemistry      Component Value Date/Time   NA 138 02/09/2012 1019   K 4.6 02/09/2012 1019   CL 105 02/09/2012 1019   CO2 20 02/09/2012 1019   BUN 15 02/09/2012 1019   CREATININE 0.72 02/09/2012 1019      Component Value Date/Time   CALCIUM 10.3 02/09/2012 1019   ALKPHOS 111 02/09/2012 1019   AST 17 02/09/2012 1019   ALT 30 02/09/2012 1019   BILITOT 0.6 02/09/2012 1019       Radiological Studies:  Dg Chest 2 View  01/30/2012  *RADIOLOGY REPORT*  Clinical Data: Fever  CHEST - 2 VIEW  Comparison: 01/19/2012  Findings: Cardiomegaly again noted.  Right IJ Port-A-Cath is unchanged in position.  Bony thorax is stable.  No acute infiltrate or pulmonary edema.  Stable mild elevation of the right hemidiaphragm with right basilar atelectasis.  IMPRESSION: No active disease.  No significant change.  Original Report Authenticated By: Natasha Mead, M.D.   Dg Chest 2 View  01/19/2012  *RADIOLOGY REPORT*  Clinical Data: Shortness of breath.  CHEST - 2 VIEW  Comparison: 01/17/2012  Findings: Low lung volumes.  Right base atelectasis.  Mild  cardiomegaly.  Left lung is clear.  No visible effusions.  No acute bony abnormality.  IMPRESSION: Cardiomegaly.  Low lung volumes with right base atelectasis.  Original Report Authenticated By: Cyndie Chime, M.D.   Dg Chest 2 View  01/17/2012  *RADIOLOGY REPORT*  Clinical Data: Fever, difficulty breathing.  CHEST - 2 VIEW  Comparison:  07/21/2006  Findings: Hypoaeration results in interstitial and vascular crowding.  Mild streaky opacity at the lung bases.  Right chest wall Port-A-Cath with tip projecting over the proximal SVC.  Heart size upper normal limits.  No pleural effusion or pneumothorax.  No acute osseous abnormality.  IMPRESSION: Mild streaky opacity at the lung bases; atelectasis versus infiltrate.  Hypoaeration results in interstitial and vascular crowding.  Original Report Authenticated By: Waneta Martins, M.D.   Dg Cholangiogram Operative  01/31/2012  *RADIOLOGY REPORT*  Clinical Data: Laparoscopic cholecystectomy.  Fluoro time 22.9 seconds.  INTRAOPERATIVE CHOLANGIOGRAM  Technique: Spot fluoroscopic images of the right upper quadrant are obtained intraoperatively during injection of contrast material into the biliary system.  Comparison:  None.  Findings: Extrahepatic bile ducts demonstrate normal caliber with free flow of contrast material to the duodenum.  No intraluminal filling defects are demonstrated.  No evidence of common duct stone or contrast extravasation.  Limited visualization of intrahepatic bile ducts with predominant right-sided ducts visualized. Visualized intrahepatic bile ducts are normal in caliber.  IMPRESSION: No evidence of biliary obstruction, bile duct stone, or contrast extravasation.  Original Report Authenticated By: Marlon Pel, M.D.   Ct Angio Chest W/cm &/or Wo Cm  01/18/2012  *RADIOLOGY REPORT*  Clinical Data: Post chemotherapy.  Right upper quadrant pain.  CT ANGIOGRAPHY CHEST  Technique:  Multidetector CT imaging of the chest using the standard protocol during bolus administration of intravenous contrast. Multiplanar reconstructed images including MIPs were obtained and reviewed to evaluate the vascular anatomy.  Contrast:  100 ml Omnipaque 300  Comparison: CT  abdomen 01/17/2012  Findings: Exam is suboptimal due to patient body habitus.  No filling defects within the main  pulmonary arteries to suggest acute pulmonary embolism.  The proximal lobar  arteries are clear of acute pulmonary embolism.  The segmental pulmonary arteries are poorly evaluated as above.  There are no acute findings aorta great vessels.  No pericardial fluid.  The esophagus is normal.  There is new mild air space disease and effusion at the right lung base.  Small focus of atelectasis at the left lung base.  No pneumothorax or pulmonary edema.  Airways appear normal.  Limited view of the upper abdomen is unremarkable.  No axillary supraclavicular adenopathy.  A port in the right anterior chest wall.  Subcutaneous nodule in the chest wall is likely and subcutaneous cyst/sebaceous cyst (17 mm (image 17).  IMPRESSION:  1.  No evidence of pulmonary embolism on suboptimal exam.  IF continued concern consider V/Q  scan in future due to patient body habitus. 2.  New bibasilar atelectasis.  Cannot exclude infiltrate at the right lung base.  Original Report Authenticated By: Genevive Bi, M.D.   Nm Hepatobiliary Liver Func  01/24/2012  *RADIOLOGY REPORT*  Clinical Data:  Right upper quadrant pain with nausea and vomiting  NUCLEAR MEDICINE HEPATOBILIARY IMAGING  Technique:  Sequential images of the abdomen were obtained out to 60 minutes following intravenous administration of radiopharmaceutical.  Radiopharmaceutical:  5.8mCi Tc-65m Choletec  Comparison:  January 17, 2012  Findings: There is homogeneous uptake of radiopharmaceutical throughout the liver.  The gallbladder is identified  at 10 minutes, as is small bowel.  IMPRESSION: There is no evidence of common duct or cystic duct obstruction.  Original Report Authenticated By: Brandon Melnick, M.D.   Nm Hepatobiliary  01/17/2012  *RADIOLOGY REPORT*  Clinical Data:  Evaluate for cholecystitis  NUCLEAR MEDICINE HEPATOBILIARY IMAGING  Technique:  Sequential images of the abdomen were obtained out to 60 minutes following intravenous administration of  radiopharmaceutical.  Radiopharmaceutical:  5.63mCi Tc-21m Choletec  Comparison:  None.  Findings: Following the intravenous administration of the radiopharmaceutical there is uniform uptake by the liver with clearance from the blood pool.  There is normal physiologic accumulation within the gallbladder within 10 minutes.  IMPRESSION:  1.  Patent cystic duct without evidence for acute cholecystitis.  Original Report Authenticated By: Rosealee Albee, M.D.   US Abdomen Complete  01/31/2012  *RADIOLOGY REPORT*  Clinical Data:   cholelithiasis, fever, abdominal pain  COMPLETE ABDOMINAL ULTRASOUND  Comparison:  01/23/2012  Findings:  Gallbladder:  Again noted multiple mobile gallstones within gallbladder.  The largest measures about 6.5 mm.  No thickening of gallbladder wall.  No sonographic Murphy's sign.  Common bile duct:  Measures 4 mm in diameter within normal limits.  Liver:  No focal lesion identified. Again noted diffuse increased echogenicity of the liver consistent with fatty infiltration.  No intrahepatic biliary ductal dilatation.  IVC:  Appears normal.  Pancreas:  No focal abnormality seen.  Spleen:  Measures 7.3 cm in length.  Normal echogenicity.  Right Kidney:  Measures 13 cm in length.  No mass, hydronephrosis or diagnostic renal calculus  Left Kidney:  Measures 13.1 cm in length.  No mass, hydronephrosis or diagnostic renal calculus  Abdominal aorta:  No aneurysm identified. Measures up to 1.8 cm in diameter.  Small amount of free fluid noted in Morison's pouch.  IMPRESSION:  1.  Again noted multiple mobile gallstones within gallbladder.  No thickening of the gallbladder wall or sonographic Murphy's sign. 2.  Normal CBD. 3.  No hydronephrosis or diagnostic renal calculus. 4.  Again noted fatty infiltration of the liver.  Original Report Authenticated By: Natasha Mead, M.D.   US Abdomen Complete  01/23/2012  *RADIOLOGY REPORT*  Clinical Data:  Right upper quadrant pain.  Diabetes.  History of acute  leukemia.  COMPLETE ABDOMINAL ULTRASOUND  Comparison:  01/15/2012 CT.  01/17/2012 limited right upper quadrant ultrasound.  Findings:  Gallbladder:  Multiple mobile gallstones.  No gallbladder wall thickening or pericholecystic fluid.  The patient was not tender over this region during scanning as per ultrasound technologist.  Common bile duct:  3.0 mm.  Liver:  Increased echogenicity consistent with fatty infiltration without focal mass.  IVC:  Appears normal.  Pancreas:  No focal abnormality seen.  Spleen:  5.6 cm.  No focal mass.  Right Kidney:  11.2 cm. No hydronephrosis or renal mass.  On the prior CT scan, hypodensity upper aspect of the right kidney may represent streak artifact from rib.  If pyelonephritis is of concern, correlation with urinalysis and clinical exam recommended. This was discussed with Grant [facility]ana emergency room physician's assistant.  Left Kidney:  12.5 cm. No hydronephrosis or renal mass.  Abdominal aorta:  No aneurysm identified.  IMPRESSION: Multiple mobile gallstones without findings of cholecystitis. Please see above.  Fatty liver.  Original Report Authenticated By: Fuller Canada, M.D.   Ct Abdomen Pelvis W Contrast  01/17/2012  *RADIOLOGY REPORT*  Clinical Data: Right-sided abdominal pain, fever.  CT ABDOMEN AND PELVIS WITH CONTRAST  Technique:  Multidetector CT imaging of the abdomen and pelvis was performed following the standard protocol during bolus administration of intravenous contrast.  Contrast: OMNIPAQUE IOHEXOL 300 MG/ML  SOLN  Comparison: 01/17/2012 ultrasound, 11/03/2008 CT  Findings: Limited images through the lung bases demonstrate no significant appreciable abnormality. The heart size is within normal limits. No pleural or pericardial effusion.  Hepatic steatosis.  Cholelithiasis.  No gallbladder wall thickening or pericholecystic fluid.  Unremarkable spleen, pancreas, adrenal glands.  Symmetric renal enhancement.  No hydronephrosis or hydroureter.  No  bowel obstruction.  No CT evidence for colitis.  Normal appendix.  No free intraperitoneal air. Within the pelvis, there is trace free fluid.  No lymphadenopathy.  Thin-walled bladder.  Unremarkable CT appearance to the uterus and adnexa.  No acute osseous abnormality.  IMPRESSION: Cholelithiasis without CT evidence for acute cholecystitis.  HIDA scan is the study of choice if clinical concern for acute cholecystitis persists.  Hepatic steatosis.  Normal appendix.  Original Report Authenticated By: Waneta Martins, M.D.   US Abdomen Limited  01/17/2012  *RADIOLOGY REPORT*  Clinical Data:  Right-sided abdominal pain and fever.  LIMITED ABDOMINAL ULTRASOUND - RIGHT UPPER QUADRANT  Comparison:   11/03/2008 CT  Findings:  Gallbladder:  Partially contracted containing multiple echogenic shadowing foci.  No gallbladder wall thickening or pericholecystic fluid.  Common bile duct:  4 mm in diameter, normal appearance.  Liver:  Heterogeneous echogenicity suggests areas of fatty infiltration.  No focal abnormality identified.  IMPRESSION: Cholelithiasis without gallbladder wall thickening or pericholecystic fluid. HIDA scan could be obtained to evalute for acute cholecystitis if clinically warranted.  Hepatic steatosis.                   Original Report Authenticated By: Waneta Martins, M.D.   Ir Removal Gap Inc W/o Fl Mod Sed  02/03/2012  *RADIOLOGY REPORT*  Clinical Data:  Bacteremia  REMOVAL OF IMPLANTED TUNNELED PORT-A-CATH  Intravenous medications: Fentanyl 75 mcg IV; Versed 1.5 mg IV; The patient is admitted to the hospital, currently on intravenous antibiotics.  Sedation time: 20 minutes  Fluoroscopy time: None  Complications: None immediate  Findings / Procedure:  Informed written consent  Informed written consent was obtained from the patient after a discussion of the risk, benefits and alternatives to the procedure. The patient was positioned supine on the fluoroscopy table and the right  chest Port-A-Cath site was prepped with chlorhexidine.  A sterile gown and gloves were worn during the procedure.  Local anesthesia was provided with 1% lidocaine with epinephrine.  A timeout was performed prior to the initiation of the procedure.  An incision was made overlying the Port-A-Cath with a #15 scalpel. Utilizing sharp and blunt dissection, the Port-A-Cath was removed completely. The pocket was inspected and was negative for obvious sign of infection. Per request from clinical team, the catheter tip was submitted for culture.  The pocked was irrigated with sterile saline.  Wound closure was performed with subcutaneous 3-0 Monocryl, subcuticular 4-0 Vicryl and Dermabond.  A dressing was placed.  The patient tolerated the procedure well without immediate post procedural complication.  IMPRESSION:  1.  Successful removal of implanted Port-A-Cath.  No obvious signs of infection.  2.  Catheter tip was sent to the laboratory for culture.  Original Report Authenticated By: Waynard Reeds, M.D.     IMPRESSIONS AND PLAN: A 26 y.o. 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 Her current EKG does show a slightly prolonged QT interval; cipro can be the cause. As she has to finish her course of cipro, I have recommended holding her arsenic till early June. She has taken more than 2 weeks of ATRA, so we will resume therapy on 6/3; at which point she will restart both drugs.  Spent more than half the time coordinating care.    Ajay Strubel 5/23/20136:30 AM

## 2012-02-12 ENCOUNTER — Ambulatory Visit: Payer: Medicaid Other

## 2012-02-12 ENCOUNTER — Ambulatory Visit (HOSPITAL_COMMUNITY): Admission: RE | Admit: 2012-02-12 | Payer: Medicaid Other | Source: Ambulatory Visit

## 2012-02-16 ENCOUNTER — Ambulatory Visit: Payer: Medicaid Other

## 2012-02-17 ENCOUNTER — Ambulatory Visit: Payer: Medicaid Other

## 2012-02-17 ENCOUNTER — Other Ambulatory Visit: Payer: Self-pay | Admitting: Radiology

## 2012-02-18 ENCOUNTER — Ambulatory Visit: Payer: Medicaid Other

## 2012-02-19 ENCOUNTER — Ambulatory Visit: Payer: Medicaid Other

## 2012-02-19 ENCOUNTER — Ambulatory Visit (HOSPITAL_BASED_OUTPATIENT_CLINIC_OR_DEPARTMENT_OTHER): Payer: Medicaid Other | Admitting: Oncology

## 2012-02-19 ENCOUNTER — Ambulatory Visit (HOSPITAL_COMMUNITY)
Admission: RE | Admit: 2012-02-19 | Discharge: 2012-02-19 | Disposition: A | Discharge status: Home / Self Care | Payer: Medicaid Other | Source: Ambulatory Visit | Attending: Physician Assistant | Admitting: Physician Assistant

## 2012-02-19 ENCOUNTER — Other Ambulatory Visit: Payer: Self-pay | Admitting: Physician Assistant

## 2012-02-19 ENCOUNTER — Encounter (HOSPITAL_COMMUNITY): Payer: Self-pay

## 2012-02-19 DIAGNOSIS — C9240 Acute promyelocytic leukemia, not having achieved remission: Secondary | ICD-10-CM

## 2012-02-19 DIAGNOSIS — I10 Essential (primary) hypertension: Secondary | ICD-10-CM | POA: Insufficient documentation

## 2012-02-19 DIAGNOSIS — O2230 Deep phlebothrombosis in pregnancy, unspecified trimester: Secondary | ICD-10-CM

## 2012-02-19 DIAGNOSIS — C9200 Acute myeloblastic leukemia, not having achieved remission: Secondary | ICD-10-CM | POA: Insufficient documentation

## 2012-02-19 DIAGNOSIS — E119 Type 2 diabetes mellitus without complications: Secondary | ICD-10-CM | POA: Insufficient documentation

## 2012-02-19 DIAGNOSIS — I808 Phlebitis and thrombophlebitis of other sites: Secondary | ICD-10-CM

## 2012-02-19 DIAGNOSIS — I82C29 Chronic embolism and thrombosis of unspecified internal jugular vein: Secondary | ICD-10-CM

## 2012-02-19 LAB — CBC
Hemoglobin: 10.3 g/dL — ABNORMAL LOW (ref 12.0–15.0)
MCH: 29 pg (ref 26.0–34.0)
MCHC: 33.3 g/dL (ref 30.0–36.0)
MCV: 87 fL (ref 78.0–100.0)
RBC: 3.55 MIL/uL — ABNORMAL LOW (ref 3.87–5.11)

## 2012-02-19 LAB — PROTIME-INR: Prothrombin Time: 13.7 seconds (ref 11.6–15.2)

## 2012-02-19 LAB — GLUCOSE, CAPILLARY: Glucose-Capillary: 230 mg/dL — ABNORMAL HIGH (ref 70–99)

## 2012-02-19 MED ORDER — CEFAZOLIN SODIUM-DEXTROSE 2-3 GM-% IV SOLR
INTRAVENOUS | Status: AC
  Filled 2012-02-19: qty 50

## 2012-02-19 MED ORDER — HEPARIN SOD (PORK) LOCK FLUSH 100 UNIT/ML IV SOLN
500.0000 [IU] | Freq: Once | INTRAVENOUS | Status: AC
  Administered 2012-02-19: 500 [IU] via INTRAVENOUS

## 2012-02-19 MED ORDER — ASPIRIN 81 MG PO CHEW: 81.0000 mg | CHEWABLE_TABLET | Freq: Once | ORAL | Status: DC

## 2012-02-19 MED ORDER — LIDOCAINE HCL 1 % IJ SOLN
INTRAMUSCULAR | Status: AC
  Filled 2012-02-19: qty 20

## 2012-02-19 MED ORDER — FENTANYL CITRATE 0.05 MG/ML IJ SOLN
INTRAMUSCULAR | Status: AC
  Filled 2012-02-19: qty 4

## 2012-02-19 MED ORDER — MIDAZOLAM HCL 2 MG/2ML IJ SOLN
INTRAMUSCULAR | Status: AC
  Filled 2012-02-19: qty 4

## 2012-02-19 MED ORDER — MIDAZOLAM HCL 5 MG/5ML IJ SOLN
INTRAMUSCULAR | Status: AC | PRN
  Administered 2012-02-19 (×2): 2 mg via INTRAVENOUS
  Administered 2012-02-19: 3 mg via INTRAVENOUS

## 2012-02-19 MED ORDER — CEFAZOLIN SODIUM-DEXTROSE 2-3 GM-% IV SOLR
2.0000 g | INTRAVENOUS | Status: AC
  Administered 2012-02-19: 2 g via INTRAVENOUS

## 2012-02-19 MED ORDER — SODIUM CHLORIDE 0.9 % IV SOLN
INTRAVENOUS | Status: DC
  Administered 2012-02-19: 15:00:00 via INTRAVENOUS

## 2012-02-19 MED ORDER — FENTANYL CITRATE 0.05 MG/ML IJ SOLN
INTRAMUSCULAR | Status: AC | PRN
  Administered 2012-02-19 (×3): 100 ug via INTRAVENOUS

## 2012-02-19 NOTE — Progress Notes (Signed)
*  PRELIMINARY RESULTS* Vascular Ultrasound Right upper extremity venous duplex has been completed.  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 Could not clearly seen subclavian vein and cannot rule out DVT in that area as well.  Farrel Demark RDMS 02/19/2012, 5:03 PM

## 2012-02-19 NOTE — Progress Notes (Signed)
Problem APML  I received call today from radiology. Melissa Washington is having her port placed. It was noted that she had a small amount of clot around her right IJ. Ultrasound has confirmed that the clot was very localized to the IJ area. It had not appeared to spread further. I saw Melissa Washington in the office. She is doing well. She is anxious to begin therapy next week. She has not had any other symptoms of swelling in her neck or arm. In exam today was relatively unremarkable as there was a little bit of supraclavicular fullness which really has not changed. I discussed this with her. I started on low-dose aspirin. We will follow her expectantly and may have to change this if there is any further swelling or evidence of progression of his clot.  Pierce Crane M.D. FRCP C.

## 2012-02-19 NOTE — Procedures (Signed)
Placement of left IJ portacath.  Tip is near cavoatrial junction.  Concern for thrombosis of the lower right jugular vein (site of previous port placement).  Right jugular thrombus was discussed with Dr. Donnie Coffin and a venous duplux has been ordered.

## 2012-02-19 NOTE — H&P (Signed)
  Chief Complaint: Acute promyelocytic leukemia   HPI: Pleasant 26 yo female with newly diagnosed leukemia is in need of portacath placement for means of chemotherapy.  She had port placed on 4-19 but subsequently developed cholecystitis and bacteremia. Her port had to be removed on 02-03-12. She has now completed her antibiotic course and is determined safe to place new port. Procedure scheduled with IR.   Past Medical History   Diagnosis  Date   .  Diabetes mellitus    .  Hypertension    .  Nephrolithiasis    .  Leukemia      in remission 12-24-11    History reviewed. No pertinent past surgical history.  Family History   Problem  Relation  Age of Onset   .  Kidney disease  Mother    .  Kidney disease  Maternal Grandmother    .  Diabetes  Maternal Grandmother     Social History: reports that she has never smoked. She does not have any smokeless tobacco history on file. She reports that she does not drink alcohol or use illicit drugs.  Allergies:  Allergies   Allergen  Reactions   .  Ultram (Tramadol Hcl)  Nausea And Vomiting    Medications Prior to Admission   Lantus, Prinivil, Slow-mag, Glucophage  Results for orders placed during the hospital encounter of 01/08/12 (from the past 48 hour(s))   GLUCOSE, CAPILLARY Status: Abnormal    Collection Time    01/08/12 12:41 PM   Component  Value  Range  Comment    Glucose-Capillary  103 (*)  70 - 99 (mg/dL)      Review of Systems  Constitutional: Negative for fever and chills.  Respiratory: Negative for cough, hemoptysis and shortness of breath.  Cardiovascular: Negative for chest pain, palpitations and leg swelling.  Gastrointestinal: Negative for nausea, vomiting, abdominal pain, diarrhea and constipation.  Genitourinary: Negative for dysuria and hematuria.  Musculoskeletal: Negative for myalgias.   Physical Exam: BP 127/98  Pulse 100  Temp 98.9 F (37.2 C)  Resp 21  SpO2 99%  LMP 12/29/2011   Constitutional: She is  oriented to person, place, and time. She appears well-developed and well-nourished. No distress.  HENT:  Head: Normocephalic and atraumatic.  Cardiovascular: Normal rate, regular rhythm, normal heart sounds and intact distal pulses. Exam reveals no friction rub.  No murmur heard.  Respiratory: Effort normal and breath sounds normal. No respiratory distress. She has no wheezes.  GI: Soft. Bowel sounds are normal. She exhibits no distension. There is no tenderness.  Neurological: She is alert and oriented to person, place, and time.  Psychiatric: She has a normal mood and affect. Her behavior is normal. Judgment and thought content normal.   Assessment/Plan  APML, requiring chemotherapy.  Procedure of port placement, including risks, complications discussed with pt.  Consent signed in chart.  Barbee Shropshire [facility] 02/19/2012 1:11 PM

## 2012-02-19 NOTE — ED Notes (Signed)
Pt was taken by this rn to dopplers after procedure. Pt tolerated well and now pt will go to cancer center for eval by her md

## 2012-02-19 NOTE — Discharge Instructions (Signed)
Implanted Port Instructions  An implanted port is a central line that has a round shape and is placed under the skin. It is used for long-term IV (intravenous) access for:   Medicine.   Fluids.   Liquid nutrition, such as TPN (total parenteral nutrition).   Blood samples.  Ports can be placed:   In the chest area just below the collarbone (this is the most common place.)   In the arms.   In the belly (abdomen) area.   In the legs.  PARTS OF THE PORT  A port has 2 main parts:   The reservoir. The reservoir is round, disc-shaped, and will be a small, raised area under your skin.   The reservoir is the part where a needle is inserted (accessed) to either give medicines or to draw blood.   The catheter. The catheter is a long, slender tube that extends from the reservoir. The catheter is placed into a large vein.   Medicine that is inserted into the reservoir goes into the catheter and then into the vein.  INSERTION OF THE PORT   The port is surgically placed in either an operating room or in a procedural area (interventional radiology).   Medicine may be given to help you relax during the procedure.   The skin where the port will be inserted is numbed (local anesthetic).   1 or 2 small cuts (incisions) will be made in the skin to insert the port.   The port can be used after it has been inserted.  INCISION SITE CARE   The incision site may have small adhesive strips on it. This helps keep the incision site closed. Sometimes, no adhesive strips are placed. Instead of adhesive strips, a special kind of surgical glue is used to keep the incision closed.   If adhesive strips were placed on the incision sites, do not take them off. They will fall off on their own.   The incision site may be sore for 1 to 2 days. Pain medicine can help.   Do not get the incision site wet. Bathe or shower as directed by your caregiver.   The incision site should heal in 5 to 7 days. A small scar may form after the  incision has healed.  ACCESSING THE PORT  Special steps must be taken to access the port:   Before the port is accessed, a numbing cream can be placed on the skin. This helps numb the skin over the port site.   A sterile technique is used to access the port.   The port is accessed with a needle. Only "non-coring" port needles should be used to access the port. Once the port is accessed, a blood return should be checked. This helps ensure the port is in the vein and is not clogged (clotted).   If your caregiver believes your port should remain accessed, a clear (transparent) bandage will be placed over the needle site. The bandage and needle will need to be changed every week or as directed by your caregiver.   Keep the bandage covering the needle clean and dry. Do not get it wet. Follow your caregiver's instructions on how to take a shower or bath when the port is accessed.   If your port does not need to stay accessed, no bandage is needed over the port.  FLUSHING THE PORT  Flushing the port keeps it from getting clogged. How often the port is flushed depends on:   If a   constant infusion is running. If a constant infusion is running, the port may not need to be flushed.   If intermittent medicines are given.   If the port is not being used.  For intermittent medicines:   The port will need to be flushed:   After medicines have been given.   After blood has been drawn.   As part of routine maintenance.   A port is normally flushed with:   Normal saline.   Heparin.   Follow your caregiver's advice on how often, how much, and the type of flush to use on your port.  IMPORTANT PORT INFORMATION   Tell your caregiver if you are allergic to heparin.   After your port is placed, you will get a manufacturer's information card. The card has information about your port. Keep this card with you at all times.   There are many types of ports available. Know what kind of port you have.   In case of an  emergency, it may be helpful to wear a medical alert bracelet. This can help alert health care workers that you have a port.   The port can stay in for as long as your caregiver believes it is necessary.   When it is time for the port to come out, surgery will be done to remove it. The surgery will be similar to how the port was put in.   If you are in the hospital or clinic:   Your port will be taken care of and flushed by a nurse.   If you are at home:   A home health care nurse may give medicines and take care of the port.   You or a family member can get special training and directions for giving medicine and taking care of the port at home.  SEEK IMMEDIATE MEDICAL CARE IF:    Your port does not flush or you are unable to get a blood return.   New drainage or pus is coming from the incision.   A bad smell is coming from the incision site.   You develop swelling or increased redness at the incision site.   You develop increased swelling or pain at the port site.   You develop swelling or pain in the surrounding skin near the port.   You have an oral temperature above 102 F (38.9 C), not controlled by medicine.  MAKE SURE YOU:    Understand these instructions.   Will watch your condition.   Will get help right away if you are not doing well or get worse.  Document Released: 09/07/2005 Document Revised: 08/27/2011 Document Reviewed: 11/29/2008  [employer] Patient Information 2012 [employer]          Moderate Sedation, Adult  Moderate sedation is given to help you relax or even sleep through a procedure. You may remain sleepy, be clumsy, or have poor balance for several hours following this procedure. Arrange for a responsible adult, family member, or friend to take you home. A responsible adult should stay with you for at least 24 hours or until the medicines have worn off.   Do not participate in any activities where you could become injured for the next 24 hours, or until you feel normal  again. Do not:   Drive.   Swim.   Ride a bicycle.   Operate heavy machinery.   Cook.   Use power tools.   Climb ladders.   Work at heights.   Do not   make important decisions or sign legal documents until you are improved.   Vomiting may occur if you eat too soon. When you can drink without vomiting, try water, juice, or soup. Try solid foods if you feel little or no nausea.   Only take over-the-counter or prescription medications for pain, discomfort, or fever as directed by your caregiver.If pain medications have been prescribed for you, ask your caregiver how soon it is safe to take them.   Make sure you and your family fully understands everything about the medication given to you. Make sure you understand what side effects may occur.   You should not drink alcohol, take sleeping pills, or medications that cause drowsiness for at least 24 hours.   If you smoke, do not smoke alone.   If you are feeling better, you may resume normal activities 24 hours after receiving sedation.   Keep all appointments as scheduled. Follow all instructions.   Ask questions if you do not understand.  SEEK MEDICAL CARE IF:    Your skin is pale or bluish in color.   You continue to feel sick to your stomach (nauseous) or throw up (vomit).   Your pain is getting worse and not helped by medication.   You have bleeding or swelling.   You are still sleepy or feeling clumsy after 24 hours.  SEEK IMMEDIATE MEDICAL CARE IF:    You develop a rash.   You have difficulty breathing.   You develop any type of allergic problem.   You have a fever.  Document Released: 06/02/2001 Document Revised: 08/27/2011 Document Reviewed: 10/24/2007  [employer] Patient Information 2012 [employer]

## 2012-02-22 ENCOUNTER — Ambulatory Visit (HOSPITAL_BASED_OUTPATIENT_CLINIC_OR_DEPARTMENT_OTHER): Payer: Medicaid Other

## 2012-02-22 ENCOUNTER — Ambulatory Visit (HOSPITAL_BASED_OUTPATIENT_CLINIC_OR_DEPARTMENT_OTHER): Payer: Medicaid Other | Admitting: Physician Assistant

## 2012-02-22 ENCOUNTER — Other Ambulatory Visit (HOSPITAL_COMMUNITY): Payer: Self-pay | Admitting: Oncology

## 2012-02-22 ENCOUNTER — Other Ambulatory Visit (HOSPITAL_BASED_OUTPATIENT_CLINIC_OR_DEPARTMENT_OTHER): Payer: Medicaid Other | Admitting: Lab

## 2012-02-22 ENCOUNTER — Other Ambulatory Visit: Payer: Self-pay

## 2012-02-22 VITALS — BP 128/84 | HR 99 | Temp 98.6°F | Ht 62.0 in | Wt 271.7 lb

## 2012-02-22 DIAGNOSIS — Z5111 Encounter for antineoplastic chemotherapy: Secondary | ICD-10-CM

## 2012-02-22 DIAGNOSIS — B961 Klebsiella pneumoniae [K. pneumoniae] as the cause of diseases classified elsewhere: Secondary | ICD-10-CM

## 2012-02-22 DIAGNOSIS — E119 Type 2 diabetes mellitus without complications: Secondary | ICD-10-CM

## 2012-02-22 DIAGNOSIS — C9240 Acute promyelocytic leukemia, not having achieved remission: Secondary | ICD-10-CM

## 2012-02-22 DIAGNOSIS — C9200 Acute myeloblastic leukemia, not having achieved remission: Secondary | ICD-10-CM

## 2012-02-22 LAB — CBC WITH DIFFERENTIAL/PLATELET
BASO%: 0.5 % (ref 0.0–2.0)
EOS%: 1.5 % (ref 0.0–7.0)
HCT: 31.5 % — ABNORMAL LOW (ref 34.8–46.6)
LYMPH%: 23.5 % (ref 14.0–49.7)
MCH: 29.4 pg (ref 25.1–34.0)
MCHC: 33.3 g/dL (ref 31.5–36.0)
MCV: 88.1 fL (ref 79.5–101.0)
MONO%: 6.8 % (ref 0.0–14.0)
NEUT%: 67.7 % (ref 38.4–76.8)
Platelets: 264 10*3/uL (ref 145–400)
RBC: 3.57 10*6/uL — ABNORMAL LOW (ref 3.70–5.45)
nRBC: 0 % (ref 0–0)

## 2012-02-22 LAB — MAGNESIUM: Magnesium: 1.6 mg/dL (ref 1.5–2.5)

## 2012-02-22 LAB — BASIC METABOLIC PANEL
BUN: 11 mg/dL (ref 6–23)
Calcium: 10.4 mg/dL (ref 8.4–10.5)
Chloride: 101 mEq/L (ref 96–112)
Creatinine, Ser: 0.66 mg/dL (ref 0.50–1.10)

## 2012-02-22 MED ORDER — SODIUM CHLORIDE 0.9 % IV SOLN
0.1500 mg/kg/d | Freq: Every day | INTRAVENOUS | Status: DC
  Administered 2012-02-22: 12 mg via INTRAVENOUS
  Filled 2012-02-22: qty 12

## 2012-02-22 MED ORDER — MAGNESIUM SULFATE 50 % IJ SOLN
1.0000 g | Freq: Once | INTRAVENOUS | Status: AC
  Administered 2012-02-22: 1 g via INTRAVENOUS
  Filled 2012-02-22: qty 2

## 2012-02-22 MED ORDER — HEPARIN SOD (PORK) LOCK FLUSH 100 UNIT/ML IV SOLN
500.0000 [IU] | Freq: Once | INTRAVENOUS | Status: AC | PRN
  Administered 2012-02-22: 500 [IU]
  Filled 2012-02-22: qty 5

## 2012-02-22 MED ORDER — SODIUM CHLORIDE 0.9 % IJ SOLN
10.0000 mL | INTRAMUSCULAR | Status: DC | PRN
  Filled 2012-02-22: qty 10

## 2012-02-22 MED ORDER — DEXAMETHASONE SODIUM PHOSPHATE 10 MG/ML IJ SOLN
10.0000 mg | Freq: Once | INTRAMUSCULAR | Status: AC
  Administered 2012-02-22: 10 mg via INTRAVENOUS

## 2012-02-22 MED ORDER — ONDANSETRON 8 MG/50ML IVPB (CHCC)
8.0000 mg | Freq: Once | INTRAVENOUS | Status: AC
  Administered 2012-02-22: 8 mg via INTRAVENOUS

## 2012-02-23 ENCOUNTER — Ambulatory Visit (HOSPITAL_BASED_OUTPATIENT_CLINIC_OR_DEPARTMENT_OTHER): Payer: Medicaid Other

## 2012-02-23 ENCOUNTER — Telehealth: Payer: Self-pay | Admitting: *Deleted

## 2012-02-23 VITALS — BP 134/86 | HR 81 | Temp 98.6°F

## 2012-02-23 DIAGNOSIS — C9200 Acute myeloblastic leukemia, not having achieved remission: Secondary | ICD-10-CM

## 2012-02-23 DIAGNOSIS — C9240 Acute promyelocytic leukemia, not having achieved remission: Secondary | ICD-10-CM

## 2012-02-23 DIAGNOSIS — Z5111 Encounter for antineoplastic chemotherapy: Secondary | ICD-10-CM

## 2012-02-23 MED ORDER — SODIUM CHLORIDE 0.9 % IV SOLN
0.1500 mg/kg/d | Freq: Every day | INTRAVENOUS | Status: DC
  Administered 2012-02-23: 12 mg via INTRAVENOUS
  Filled 2012-02-23: qty 12

## 2012-02-23 MED ORDER — ONDANSETRON 8 MG/50ML IVPB (CHCC)
8.0000 mg | Freq: Once | INTRAVENOUS | Status: AC
  Administered 2012-02-23: 8 mg via INTRAVENOUS

## 2012-02-23 MED ORDER — SODIUM CHLORIDE 0.9 % IV SOLN
Freq: Once | INTRAVENOUS | Status: AC
  Administered 2012-02-23: 15:00:00 via INTRAVENOUS

## 2012-02-23 MED ORDER — SODIUM CHLORIDE 0.9 % IJ SOLN
10.0000 mL | INTRAMUSCULAR | Status: DC | PRN
  Administered 2012-02-23: 10 mL
  Filled 2012-02-23: qty 10

## 2012-02-23 MED ORDER — HEPARIN SOD (PORK) LOCK FLUSH 100 UNIT/ML IV SOLN
500.0000 [IU] | Freq: Once | INTRAVENOUS | Status: AC | PRN
  Administered 2012-02-23: 500 [IU]
  Filled 2012-02-23: qty 5

## 2012-02-23 MED ORDER — DEXAMETHASONE SODIUM PHOSPHATE 10 MG/ML IJ SOLN
10.0000 mg | Freq: Once | INTRAMUSCULAR | Status: AC
  Administered 2012-02-23: 10 mg via INTRAVENOUS

## 2012-02-23 NOTE — Telephone Encounter (Signed)
Pere-mail from [place], i have scheduled appts.  JMW

## 2012-02-23 NOTE — Patient Instructions (Signed)
[facility] Discharge Instructions for Patients Receiving Chemotherapy  Today you received the following chemotherapy agents Arsenic  To help prevent nausea and vomiting after your treatment, we encourage you to take your nausea medication Begin taking it at 7pm and take it as often as prescribed for the next 24-72 hours.   If you develop nausea and vomiting that is not controlled by your nausea medication, call the clinic. If it is after clinic hours your family physician or the after hours number for the clinic or go to the Emergency Department.   BELOW ARE SYMPTOMS THAT SHOULD BE REPORTED IMMEDIATELY:  *FEVER GREATER THAN 100.5 F  *CHILLS WITH OR WITHOUT FEVER  NAUSEA AND VOMITING THAT IS NOT CONTROLLED WITH YOUR NAUSEA MEDICATION  *UNUSUAL SHORTNESS OF BREATH  *UNUSUAL BRUISING OR BLEEDING  TENDERNESS IN MOUTH AND THROAT WITH OR WITHOUT PRESENCE OF ULCERS  *URINARY PROBLEMS  *BOWEL PROBLEMS  UNUSUAL RASH Items with * indicate a potential emergency and should be followed up as soon as possible.  One of the nurses will contact you 24 hours after your treatment. Please let the nurse know about any problems that you may have experienced. Feel free to call the clinic you have any questions or concerns. The clinic phone number is [phone].   I have been informed and understand all the instructions given to me. I know to contact the clinic, my physician, or go to the Emergency Department if any problems should occur. I do not have any questions at this time, but understand that I may call the clinic during office hours   should I have any questions or need assistance in obtaining follow up care.    __________________________________________  _____________  __________ Signature of Patient or Authorized Representative            Date                   Time    __________________________________________ Nurse's Signature

## 2012-02-23 NOTE — Telephone Encounter (Signed)
gave patient appointments for 02-29-2012 03-01-2012 03-02-2012 03-03-2012 03-04-2012 sent michelle email to add on the patient treatments

## 2012-02-24 ENCOUNTER — Ambulatory Visit (HOSPITAL_BASED_OUTPATIENT_CLINIC_OR_DEPARTMENT_OTHER): Payer: Medicaid Other

## 2012-02-24 VITALS — BP 107/74 | HR 85 | Temp 98.4°F

## 2012-02-24 DIAGNOSIS — C9240 Acute promyelocytic leukemia, not having achieved remission: Secondary | ICD-10-CM

## 2012-02-24 DIAGNOSIS — Z5111 Encounter for antineoplastic chemotherapy: Secondary | ICD-10-CM

## 2012-02-24 DIAGNOSIS — C9200 Acute myeloblastic leukemia, not having achieved remission: Secondary | ICD-10-CM

## 2012-02-24 MED ORDER — DEXAMETHASONE SODIUM PHOSPHATE 10 MG/ML IJ SOLN
10.0000 mg | Freq: Once | INTRAMUSCULAR | Status: AC
  Administered 2012-02-24: 10 mg via INTRAVENOUS

## 2012-02-24 MED ORDER — SODIUM CHLORIDE 0.9 % IV SOLN
0.1500 mg/kg/d | Freq: Every day | INTRAVENOUS | Status: DC
  Administered 2012-02-24: 12 mg via INTRAVENOUS
  Filled 2012-02-24: qty 12

## 2012-02-24 MED ORDER — ONDANSETRON 8 MG/50ML IVPB (CHCC)
8.0000 mg | Freq: Once | INTRAVENOUS | Status: AC
  Administered 2012-02-24: 8 mg via INTRAVENOUS

## 2012-02-24 NOTE — Progress Notes (Signed)
Hematology and Oncology Follow Up Visit  Melissa Washington [id number] 16-Dec-1985 25 y.o. 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  2. Type 2 diabetes mellitus.  3. Recent hospitalization for cholecystitis for which she subsequently underwent a laparoscopic cholecystectomy.  4. History of Klebsiella bacteremia.  Interim History:   Melissa Washington is seen today prior to initiating day 1, week 1, cycle 2 of arsenic trioxide based therapy given concomitantly with ATRA for APL. Of note, she had her port placed last week, she was seen between a scheduled office visit by Dr. Donnie Coffin on 02/19/2012 due to a small amount of clot around the right internal jugular vein. She is on low dose aspirin.  She voices no complaints today, no fevers, chills, or night sweats. No shortness of breath or chest pain. No bleeding or bruising symptoms. Energy level is actually quite good. She is not experiencing any unexplained weight loss, no drenching night sweats. She denies nausea, emesis, diarrhea, or constipation issues. A detailed review of systems is otherwise noncontributory as noted below.  Review of Systems: Constitutional:  no weight loss, fever, night sweats and feels well Eyes: no complaints ENT: no complaints Cardiovascular: no chest pain or dyspnea on exertion Respiratory: no cough, shortness of breath, or wheezing Neurological: no TIA or stroke symptoms Dermatological: negative Gastrointestinal: no abdominal pain, change in bowel habits, or black or bloody stools Genito-Urinary: no dysuria, trouble voiding, or hematuria Hematological and Lymphatic: negative Breast: negative Musculoskeletal: negative Remaining ROS negative.  Medications:   I have reviewed the patient's current medications.  Current Outpatient Prescriptions    Medication Sig Dispense Refill  . insulin glargine (LANTUS) 100 UNIT/ML injection Inject 35 Units into the skin daily.  1 vial  12  . Insulin Syringe-Needle U-100 26G X 1/2" 1 ML MISC 35 Units by Does not apply route AC breakfast.  100 each  6  . lisinopril (PRINIVIL,ZESTRIL) 10 MG tablet Take 1 tablet (10 mg total) by mouth daily.  30 tablet  4  . metFORMIN (GLUCOPHAGE) 1000 MG tablet Take 1 tablet (1,000 mg total) by mouth daily with breakfast.  90 tablet  4  . tretinoin (VESANOID) 10 MG capsule Take 50 mg by mouth 2 (two) times daily.      . magnesium chloride (SLOW-MAG) 64 MG TBEC Take 2 tablets by mouth daily.      . promethazine (PHENERGAN) 12.5 MG tablet Take 12.5 mg by mouth every 8 (eight) hours as needed. For nausea      . DISCONTD: glipiZIDE (GLUCOTROL) 5 MG tablet Take 1 tablet (5 mg total) by mouth 2 (two) times daily before a meal.  60 tablet  0   No current facility-administered medications for this visit.   Facility-Administered Medications Ordered in Other Visits  Medication Dose Route Frequency Provider Last Rate Last Dose  . 0.9 %  sodium chloride infusion   Intravenous Once Pierce Crane, MD      . arsenic trioxide (TRISENOX) 12 mg in sodium chloride 0.9 % 250 mL chemo Infusion  0.15 mg/kg/day (Treatment Plan Adjusted) Intravenous Daily Pierce Crane, MD 131 mL/hr at 02/24/12 1414 12 mg at 02/24/12 1414  . dexamethasone (DECADRON) injection 10 mg  10 mg Intravenous Once Pierce Crane, MD   10 mg at 02/24/12 1350  . heparin lock flush 100 unit/mL  500 Units Intracatheter  Once PRN Pierce Crane, MD   500 Units at 02/23/12 1747  . ondansetron (ZOFRAN) IVPB 8 mg  8 mg Intravenous Once Pierce Crane, MD   8 mg at 02/24/12 1350  . DISCONTD: arsenic trioxide (TRISENOX) 12 mg in sodium chloride 0.9 % 250 mL chemo Infusion  0.15 mg/kg/day (Treatment Plan Adjusted) Intravenous Daily Pierce Crane, MD   12 mg at 02/23/12 1536  . DISCONTD: sodium chloride 0.9 % injection 10 mL  10 mL Intracatheter  PRN Pierce Crane, MD   10 mL at 02/23/12 1747    Allergies:  Allergies  Allergen Reactions  . Ultram (Tramadol Hcl) Nausea And Vomiting    Physical Exam: Filed Vitals:   02/22/12 1426  BP: 128/84  Pulse: 99  Temp: 98.6 F (37 C)    Body mass index is 49.69 kg/(m^2). Weight: 272 lbs. HEENT:  Sclerae anicteric, conjunctivae pink.  Oropharynx clear.  No mucositis or candidiasis.   Nodes:  No cervical, supraclavicular, or axillary lymphadenopathy palpated.   Lungs:  Clear to auscultation bilaterally.  No crackles, rhonchi, or wheezes.   Heart:  Regular rate and rhythm. Her left anterior chest wall site is well healed, and minimal erythema no evidence of ecchymoses. Abdomen:  Soft, nontender.  Positive bowel sounds.  No organomegaly or masses palpated.   Musculoskeletal:  No focal spinal tenderness to palpation.  Extremities:  Benign.  No peripheral edema or cyanosis.   Skin:  Benign.   Neuro:  Nonfocal, alert and oriented x 3   Lab Results: Lab Results  Component Value Date   WBC 7.7 02/22/2012   HGB 10.5* 02/22/2012   HCT 31.5* 02/22/2012   MCV 88.1 02/22/2012   PLT 264 02/22/2012   NEUTROABS 5.2 02/22/2012     Chemistry      Component Value Date/Time   NA 138 02/22/2012 1411   K 3.6 02/22/2012 1411   CL 101 02/22/2012 1411   CO2 23 02/22/2012 1411   BUN 11 02/22/2012 1411   CREATININE 0.66 02/22/2012 1411      Component Value Date/Time   CALCIUM 10.4 02/22/2012 1411   ALKPHOS 111 02/09/2012 1019   AST 17 02/09/2012 1019   ALT 30 02/09/2012 1019   BILITOT 0.6 02/09/2012 1019      No results found for this basename: LABCA2    Radiological Studies: Dg Chest 2 View 01/30/2012  *RADIOLOGY REPORT*  Clinical Data: Fever  CHEST - 2 VIEW  Comparison: 01/19/2012  Findings: Cardiomegaly again noted.  Right IJ Port-A-Cath is unchanged in position.  Bony thorax is stable.  No acute infiltrate or pulmonary edema.  Stable mild elevation of the right hemidiaphragm with right basilar atelectasis.   IMPRESSION: No active disease.  No significant change.  Original Report Authenticated By: Natasha Mead, M.D.   Dg Cholangiogram Operative 01/31/2012  *RADIOLOGY REPORT*  Clinical Data: Laparoscopic cholecystectomy.  Fluoro time 22.9 seconds.  INTRAOPERATIVE CHOLANGIOGRAM  Technique: Spot fluoroscopic images of the right upper quadrant are obtained intraoperatively during injection of contrast material into the biliary system.  Comparison:  None.  Findings: Extrahepatic bile ducts demonstrate normal caliber with free flow of contrast material to the duodenum.  No intraluminal filling defects are demonstrated.  No evidence of common duct stone or contrast extravasation.  Limited visualization of intrahepatic bile ducts with predominant right-sided ducts visualized. Visualized intrahepatic bile ducts are normal in caliber.  IMPRESSION: No evidence of biliary obstruction, bile duct stone, or contrast extravasation.  Original Report Authenticated By: Tawni Pummel.  Andria Meuse, M.D.   US Abdomen Complete 01/31/2012  *RADIOLOGY REPORT*  Clinical Data:   cholelithiasis, fever, abdominal pain  COMPLETE ABDOMINAL ULTRASOUND  Comparison:  01/23/2012  Findings:  Gallbladder:  Again noted multiple mobile gallstones within gallbladder.  The largest measures about 6.5 mm.  No thickening of gallbladder wall.  No sonographic Murphy's sign.  Common bile duct:  Measures 4 mm in diameter within normal limits.  Liver:  No focal lesion identified. Again noted diffuse increased echogenicity of the liver consistent with fatty infiltration.  No intrahepatic biliary ductal dilatation.  IVC:  Appears normal.  Pancreas:  No focal abnormality seen.  Spleen:  Measures 7.3 cm in length.  Normal echogenicity.  Right Kidney:  Measures 13 cm in length.  No mass, hydronephrosis or diagnostic renal calculus  Left Kidney:  Measures 13.1 cm in length.  No mass, hydronephrosis or diagnostic renal calculus  Abdominal aorta:  No aneurysm identified. Measures up  to 1.8 cm in diameter.  Small amount of free fluid noted in Morison's pouch.  IMPRESSION:  1.  Again noted multiple mobile gallstones within gallbladder.  No thickening of the gallbladder wall or sonographic Murphy's sign. 2.  Normal CBD. 3.  No hydronephrosis or diagnostic renal calculus. 4.  Again noted fatty infiltration of the liver.  Original Report Authenticated By: Natasha Mead, M.D.   Ir Fluoro Guide Cv Line Left 02/19/2012  *RADIOLOGY REPORT*  Clinical Data: 26 year old with history of leukemia.  The patient had a right jugular Port-A-Cath which was removed because of bacteremia.  The patient needs a new Port-A-Cath for treatment.  FLUOROSCOPIC AND ULTRASOUND GUIDED PLACEMENT OF A SUBCUTANEOUS PORT.  Physician: Rachelle Hora. Henn, MD  Medications:Versed 7 mg, Fentanyl 200 mcg.  A radiology nurse monitored the patient for moderate sedation.  As antibiotic prophylaxis, Ancef 2 gm was ordered pre-procedure and administered intravenously within one hour of incision.  Moderate sedation time:. 105 minutes.  Fluoroscopy time: 5.2 minutes  Procedure:  The risks of the procedure were explained to the patient.  Informed consent was obtained.  Patient was placed supine on the interventional table. The upper right jugular vein was patent by ultrasound.  The right side of the neck was prepped and draped in a sterile fashion.  Skin was anesthetized with lidocaine. 21 gauge needle was directed into the right internal jugular vein with ultrasound.   A wire would not advance towards the heart. Further evaluation with ultrasound demonstrated echogenic thrombus in the lower right jugular vein.  As a result, the wire was removed and a bandage was placed over the small incision.  Ultrasound confirmed a patent left internal jugular vein.  The left chest and neck were cleaned with a skin antiseptic and a sterile drape was placed.  Maximal barrier sterile technique was utilized including caps, mask, sterile gowns, sterile gloves, sterile  drape, hand hygiene and skin antiseptic.  The left neck was anesthetized with 1% lidocaine.  Small incision was made in the left neck with a blade.  Micropuncture set was placed in the left IJ with ultrasound guidance.  A 5-French Kumpe catheter was advanced into the IVC using a Bentson wire.  The left chest was anesthetized with 1% lidocaine with epinephrine.  #15 blade was used to make an incision and a subcutaneous port pocket was formed.  The Kumpe was exchanged for a peel-away sheath.  The catheter was placed through the peel- away sheath and the tip was positioned at the cavoatrial junction. A subcutaneous tunnel was  formed and the catheter was pulled through the tunnel in a retrograde fashion. The catheter was cut to an appropriate length and attached to the 8-French Power Port. Catheter placement was confirmed with fluoroscopy.  The port was accessed and flushed with heparinized saline.  The port pocket was closed using two layers of absorbable sutures and Dermabond.  The vein skin site was closed using Dermabond.  Sterile dressings were applied.  Patient tolerated the procedure well without an immediate complication.  Ultrasound and fluoroscopic images were taken and saved for this procedure.  Complications: None  Impression:  Placement of a subcutaneous port device. Catheter tip at the cavoatrial junction.  Concern for thrombus in the lower right internal jugular vein. The thrombus is probably related to the prior port at this site. Findings discussed with the patient's oncologist,  Dr. Donnie Coffin.  A venous duplex examination has been ordered.  Original Report Authenticated By: Richarda Overlie, M.D.   Ir Removal Gap Inc W/o Fl Mod Sed 02/03/2012  *RADIOLOGY REPORT*  Clinical Data:  Bacteremia  REMOVAL OF IMPLANTED TUNNELED PORT-A-CATH  Intravenous medications: Fentanyl 75 mcg IV; Versed 1.5 mg IV; The patient is admitted to the hospital, currently on intravenous antibiotics.  Sedation time: 20 minutes   Fluoroscopy time: None  Complications: None immediate  Findings / Procedure:  Informed written consent  Informed written consent was obtained from the patient after a discussion of the risk, benefits and alternatives to the procedure. The patient was positioned supine on the fluoroscopy table and the right chest Port-A-Cath site was prepped with chlorhexidine.  A sterile gown and gloves were worn during the procedure.  Local anesthesia was provided with 1% lidocaine with epinephrine.  A timeout was performed prior to the initiation of the procedure.  An incision was made overlying the Port-A-Cath with a #15 scalpel. Utilizing sharp and blunt dissection, the Port-A-Cath was removed completely. The pocket was inspected and was negative for obvious sign of infection. Per request from clinical team, the catheter tip was submitted for culture.  The pocked was irrigated with sterile saline.  Wound closure was performed with subcutaneous 3-0 Monocryl, subcuticular 4-0 Vicryl and Dermabond.  A dressing was placed.  The patient tolerated the procedure well without immediate post procedural complication.  IMPRESSION:  1.  Successful removal of implanted Port-A-Cath.  No obvious signs of infection.  2.  Catheter tip was sent to the laboratory for culture.  Original Report Authenticated By: Waynard Reeds, M.D.   Ir US Guide Vasc Access Left 02/19/2012  *RADIOLOGY REPORT*  Clinical Data: 26 year old with history of leukemia.  The patient had a right jugular Port-A-Cath which was removed because of bacteremia.  The patient needs a new Port-A-Cath for treatment.  FLUOROSCOPIC AND ULTRASOUND GUIDED PLACEMENT OF A SUBCUTANEOUS PORT.  Physician: Rachelle Hora. Henn, MD  Medications:Versed 7 mg, Fentanyl 200 mcg.  A radiology nurse monitored the patient for moderate sedation.  As antibiotic prophylaxis, Ancef 2 gm was ordered pre-procedure and administered intravenously within one hour of incision.  Moderate sedation time:. 105 minutes.   Fluoroscopy time: 5.2 minutes  Procedure:  The risks of the procedure were explained to the patient.  Informed consent was obtained.  Patient was placed supine on the interventional table. The upper right jugular vein was patent by ultrasound.  The right side of the neck was prepped and draped in a sterile fashion.  Skin was anesthetized with lidocaine. 21 gauge needle was directed into the right internal jugular  vein with ultrasound.   A wire would not advance towards the heart. Further evaluation with ultrasound demonstrated echogenic thrombus in the lower right jugular vein.  As a result, the wire was removed and a bandage was placed over the small incision.  Ultrasound confirmed a patent left internal jugular vein.  The left chest and neck were cleaned with a skin antiseptic and a sterile drape was placed.  Maximal barrier sterile technique was utilized including caps, mask, sterile gowns, sterile gloves, sterile drape, hand hygiene and skin antiseptic.  The left neck was anesthetized with 1% lidocaine.  Small incision was made in the left neck with a blade.  Micropuncture set was placed in the left IJ with ultrasound guidance.  A 5-French Kumpe catheter was advanced into the IVC using a Bentson wire.  The left chest was anesthetized with 1% lidocaine with epinephrine.  #15 blade was used to make an incision and a subcutaneous port pocket was formed.  The Kumpe was exchanged for a peel-away sheath.  The catheter was placed through the peel- away sheath and the tip was positioned at the cavoatrial junction. A subcutaneous tunnel was formed and the catheter was pulled through the tunnel in a retrograde fashion. The catheter was cut to an appropriate length and attached to the 8-French Power Port. Catheter placement was confirmed with fluoroscopy.  The port was accessed and flushed with heparinized saline.  The port pocket was closed using two layers of absorbable sutures and Dermabond.  The vein skin site was  closed using Dermabond.  Sterile dressings were applied.  Patient tolerated the procedure well without an immediate complication.  Ultrasound and fluoroscopic images were taken and saved for this procedure.  Complications: None  Impression:  Placement of a subcutaneous port device. Catheter tip at the cavoatrial junction.  Concern for thrombus in the lower right internal jugular vein. The thrombus is probably related to the prior port at this site. Findings discussed with the patient's oncologist,  Dr. Donnie Coffin.  A venous duplex examination has been ordered.  Original Report Authenticated By: Richarda Overlie, M.D.   12 lead EKG: Obtained 02/22/2012--No evidence of QT interval elongation.  Assessment:  A 26 year old [place] woman with acute myelocytic leukemia due for day one, week 1 cycle 2 of arsenic trioxide given on days 1-5, 8-12 on a q. 28 day regimen.  Oral ATRA per protocol.  2. Diabetes mellitus type 2  3. Recent cholecystitis/laparoscopic cholecystectomy  4. Klebsiella bacteremia  5. Hypomagnesemia  This case has been reviewed with Dr. Pierce Crane.   Plan:  Patient will initiate day 1, week 1, cycle 2 of her arsenic-based regimen given with ATRA for APL.  Patient understands the importance of taking her oral magnesium as previously prescribed. She will receive 1 g of magnesium IV today. We will be rechecking a CBC, and STAT serum chemistry along with serum magnesium level on 02/25/12 prior to day 4, week 1, cycle 2.  She will be seen in one week's time prior to day 8,  cycle 2, to include CBC, serum chemistry, and 12-lead EKG.  This plan was reviewed with the patient, who voices understanding and agreement.  She knows to call with any changes or problems.    Jaysiah Marchetta T, PA-C 02/22/12

## 2012-02-24 NOTE — Discharge Summary (Signed)
Family Medicine Resident Discharge Summary  Patient ID: Melissa Washington [id number] 26 y.o. 03-08-1986  Admit date: 01/30/2012  Discharge date and time: No discharge date for patient encounter.   Admitting Physician: Tobin Chad, MD   Discharge Physician: Paula Compton, MD  Admission Diagnoses: Type II or unspecified type diabetes mellitus without mention of complication, not stated as uncontrolled [250.00] (DIABETES-TYPE 2) Cholelithiasis [574] Abdominal pain [789.0] Dehydration [276.51] DKA (diabetic ketoacidoses) [250.10] Leukemia [208.90] Urinary tract infection [599.0] Fever [780.60] Fever acute cholecystitis  Discharge Diagnoses: APML, GNR bacteremia and sepsis, DKA, Cholecystectomy  Admission Condition: poor  Discharged Condition: good  Indication for Admission: DKA, sepsis  Hospital Course:   Sepsis: Admitted in GNR sepsis confirmed w/ cultures. Consulted CCM who felt that pt did not need CCM admission. Continued to have high fevers and ABX were broadened to imipenem.  5/11 - S/p Emergent Cholecystectomy  Improving and speciation returned; narrowed to CIPRO  Discussed with Heme/ONC will need OP replacement of Port-a-cath but agreed to removal  Pt afebrile for greater than 36hrs prior to discharge.  Cipro to continue as outpt for a total of 14 days.   DKA/Metabolic: Admitted in anion gap diabetic keto acidosis, w/ CBG of 334 Started on Insulin Drip; briefly off on day 1 of hospitalization but gap returned and insulin drip restarted. Gap subsequently closed and pt transtioned to insulin regimen as below w/ excellent glucose control. CBG in the 150s primarily at time of discharge.   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 Not an active problem during  hospitalization Consult to Heme-Onc No pancytopenia or neutropenia.  Continued on Retinoic acid per Heme/Onc Will need port-a-cath replaced as this was removed during hosptalization due to concern for being possible infectious source  Cholelithiasis and acute cholecystitis: S/P cholecystectomy. pt with 3rd admission for Abodminal pain in 3 weeks; developed fevers, chills and leukocytosis  5/11 - S/P emergent cholecystectomy  Consults: pulmonary/intensive care and surgery, and Heme/Onc  Significant Diagnostic Studies:   5/10 < > Blood Cultures - Klebsiella X 1 - sensitive to CIPRO  5/10 < > Urine Cultures - >100,000 CFUs of Multiple morphotypes  5/13 < > Blood Cutlures X 2 >>> Negative at time of DC 5/15 < > Cath tip Cx >> negative at time of discharge   Further labs results below   Treatments: surgery: Laproscopic Cholecystectomy  Discharge Exam: General: NAD, obese, no distress CV: RRR, no m/r/g Res: CTAB, normal effort Abd: non-painful to palpation Skin: Laproscopic incision sites c/d/i Ext: 2+ pulses throughout  Disposition: home  Medication List  As of 02/04/2012  2:50 PM   START taking these medications         ciprofloxacin 500 MG tablet   Commonly known as: CIPRO   Take 1 tablet (500 mg total) by mouth 2 (two) times daily.      insulin glargine 100 UNIT/ML injection   Commonly known as: LANTUS   Inject 35 Units into the skin daily.         CONTINUE taking these medications         lisinopril 10 MG tablet   Commonly known as: PRINIVIL,ZESTRIL   Take 1 tablet (10 mg total) by mouth daily.      magnesium chloride 64 MG Tbec   Commonly known  as: SLOW-MAG      metFORMIN 1000 MG tablet   Commonly known as: GLUCOPHAGE   Take 1 tablet (1,000 mg total) by mouth daily with breakfast.      promethazine 12.5 MG tablet   Commonly known as: PHENERGAN      tretinoin 10 MG capsule   Commonly known as: VESANOID         STOP taking these medications          ibuprofen 800 MG tablet          Where to get your medications    These are the prescriptions that you need to pick up. We sent them to a specific pharmacy, so you will need to go there to get them.   [id number] - [place], [place] - [address] AT [facility] OF [address]    [address]   [id number]    Phone: [phone]        ciprofloxacin 500 MG tablet   insulin glargine 100 UNIT/ML injection              Patient Instructions: Activity: activity as tolerated and ambulate in house Diet: regular diet Wound Care: as directed  Follow-up with Dr. Rivka Safer on 02/10/12 08:15  Follow-up Items: 1. Cipro continued for a total of 14 days ABX 2. Diabetes management as pt now requiring insulin  Signed: Fiza Nation, MD Family Medicine Resident PGY-1 [phone] 02/04/2012 2:50 PM    Lab  02/01/12 0406  02/01/12 0055  01/31/12 0355  01/30/12 1505   WBC  11.3*  10.9*  20.2*  14.4*   HGB  9.0*  9.4*  10.2*  10.5*   HCT  26.2*  27.4*  28.9*  29.8*   PLT  315  291  352  392     Lab  02/03/12 0535  02/02/12 0410  02/01/12 2100  02/01/12 1929  02/01/12 1453  02/01/12 1155  02/01/12 0406  02/01/12 0055  01/31/12 0355  01/30/12 2019   NA  138  136  137  135  134*  133*  136136  136  137  136   K  3.5  3.2*  3.4*  3.6  3.3*  3.6  3.83.6  3.9  3.3*  3.4*   CL  104  103  105  104  102  102  104104  103  101  104   CO2  22  21  22  20   18*  18*  1919  20  18*  18*   BUN  8  4*  4*  5*  4*  5*  66  7  5*  8   CREATININE  0.63  0.54  0.54  0.60  0.64  0.62  0.680.68  0.69  0.57  0.55   CALCIUM  9.4  9.2  9.1  9.0  9.1  9.3  9.19.0  9.0  9.3  9.1   GLUCOSE  157*  154*  189*  187*  141*  173*  165*163*  213*  225*  206*    Metabolic:   Lab  65/78/46 2055  02/02/12 1644  02/02/12 1216  02/02/12 0756  02/02/12 0637  02/02/12 0536  02/02/12 0446  02/02/12 0344  02/02/12 0244  02/02/12 0141   GLUCAP  158*  208*  146*  160*  146*  131*  141*   153*  153*  156*    No results found for this  basename: HGBA1C:3 in the last 168 hours   Lab  02/02/12 0410  02/01/12 1453   MG  1.9  1.4*   PHOS  --  2.1*     Lab  02/02/12 0410  02/01/12 1453  02/01/12 0406  01/30/12 1459   ALT  45*  40*  35  23   AST  47*  45*  46*  13   ALKPHOS  100  91  101  106   BILITOT  1.4*  1.5*  1.0  0.5   BILIDIR  0.6*  0.6*  --  --     Lab  02/02/12 0410  02/01/12 1453  02/01/12 0406   PROT  6.7  6.7  6.8   ALBUMIN  2.8*  2.7*  2.9*   PREALBUMIN  --  --  --    Hematology:   Lab  01/30/12 1505   LYMPHSABS  1.7   MONOABS  0.8   EOSABS  0.1   BASOSABS  0.0     Lab  02/01/12 0406  02/01/12 0055  01/31/12 0355   RDW  15.4  15.5  15.1   MCV  87.0  87.0  85.3   MCHC  34.4  34.3  35.3   MRET  --  --  --     Lab  01/31/12 1400   INR  1.43   APTT  35    Other Labs:   Lab  01/31/12 1100   AMYLASE  --   LIPASE  53     Lab  01/30/12 1500   LATICACIDVEN  3.3*   PROCALCITON  --     Lab  01/30/12 1349   PREGTESTUR  NEGATIVE   PREGSERUM  --   HCG  --   HCGQUANT  --    Urinalysis   Lab  01/30/12 1346   COLORURINE  YELLOW   APPEARANCEUR  CLEAR   LABSPEC  1.020   PHURINE  5.5   GLUCOSEU  >1000*   HGBUR  NEGATIVE   BILIRUBINUR  NEGATIVE   KETONESUR  NEGATIVE   PROTEINUR  NEGATIVE   UROBILINOGEN  0.2   NITRITE  NEGATIVE   LEUKOCYTESUR  TRACE*

## 2012-02-24 NOTE — Patient Instructions (Signed)
[facility] Discharge Instructions for Patients Receiving Chemotherapy  Today you received the following chemotherapy agents: aresenic.  To help prevent nausea and vomiting after your treatment, we encourage you to take your nausea medication.  If you develop nausea and vomiting that is not controlled by your nausea medication, call the clinic.    BELOW ARE SYMPTOMS THAT SHOULD BE REPORTED IMMEDIATELY:  *FEVER GREATER THAN 100.5 F  *CHILLS WITH OR WITHOUT FEVER  NAUSEA AND VOMITING THAT IS NOT CONTROLLED WITH YOUR NAUSEA MEDICATION  *UNUSUAL SHORTNESS OF BREATH  *UNUSUAL BRUISING OR BLEEDING  TENDERNESS IN MOUTH AND THROAT WITH OR WITHOUT PRESENCE OF ULCERS  *URINARY PROBLEMS  *BOWEL PROBLEMS  UNUSUAL RASH Items with * indicate a potential emergency and should be followed up as soon as possible.  Please let the nurse know about any problems that you may have experienced. Feel free to call the clinic you have any questions or concerns. The clinic phone number is [phone].

## 2012-02-25 ENCOUNTER — Other Ambulatory Visit (HOSPITAL_BASED_OUTPATIENT_CLINIC_OR_DEPARTMENT_OTHER): Payer: Medicaid Other | Admitting: Lab

## 2012-02-25 ENCOUNTER — Ambulatory Visit (HOSPITAL_BASED_OUTPATIENT_CLINIC_OR_DEPARTMENT_OTHER): Payer: Medicaid Other

## 2012-02-25 VITALS — BP 121/89 | HR 73 | Temp 97.9°F

## 2012-02-25 DIAGNOSIS — C9200 Acute myeloblastic leukemia, not having achieved remission: Secondary | ICD-10-CM

## 2012-02-25 DIAGNOSIS — C9240 Acute promyelocytic leukemia, not having achieved remission: Secondary | ICD-10-CM

## 2012-02-25 DIAGNOSIS — Z5111 Encounter for antineoplastic chemotherapy: Secondary | ICD-10-CM

## 2012-02-25 LAB — CBC WITH DIFFERENTIAL/PLATELET
BASO%: 0.1 % (ref 0.0–2.0)
EOS%: 0 % (ref 0.0–7.0)
HCT: 32 % — ABNORMAL LOW (ref 34.8–46.6)
MCH: 29 pg (ref 25.1–34.0)
MCHC: 34.4 g/dL (ref 31.5–36.0)
MONO#: 1 10*3/uL — ABNORMAL HIGH (ref 0.1–0.9)
RBC: 3.79 10*6/uL (ref 3.70–5.45)
RDW: 15.5 % — ABNORMAL HIGH (ref 11.2–14.5)
WBC: 13 10*3/uL — ABNORMAL HIGH (ref 3.9–10.3)
lymph#: 1.8 10*3/uL (ref 0.9–3.3)
nRBC: 0 % (ref 0–0)

## 2012-02-25 LAB — COMPREHENSIVE METABOLIC PANEL
ALT: 12 U/L (ref 0–35)
AST: 9 U/L (ref 0–37)
Albumin: 4.2 g/dL (ref 3.5–5.2)
Alkaline Phosphatase: 77 U/L (ref 39–117)
Calcium: 9.9 mg/dL (ref 8.4–10.5)
Chloride: 99 mEq/L (ref 96–112)
Potassium: 3.8 mEq/L (ref 3.5–5.3)
Sodium: 133 mEq/L — ABNORMAL LOW (ref 135–145)

## 2012-02-25 MED ORDER — SODIUM CHLORIDE 0.9 % IJ SOLN
10.0000 mL | INTRAMUSCULAR | Status: DC | PRN
  Administered 2012-02-25: 10 mL
  Filled 2012-02-25: qty 10

## 2012-02-25 MED ORDER — SODIUM CHLORIDE 0.9 % IV SOLN
Freq: Once | INTRAVENOUS | Status: AC
  Administered 2012-02-25: 11:00:00 via INTRAVENOUS

## 2012-02-25 MED ORDER — HEPARIN SOD (PORK) LOCK FLUSH 100 UNIT/ML IV SOLN
500.0000 [IU] | Freq: Once | INTRAVENOUS | Status: AC | PRN
  Administered 2012-02-25: 500 [IU]
  Filled 2012-02-25: qty 5

## 2012-02-25 MED ORDER — SODIUM CHLORIDE 0.9 % IV SOLN
0.1500 mg/kg/d | Freq: Every day | INTRAVENOUS | Status: DC
  Administered 2012-02-25: 12 mg via INTRAVENOUS
  Filled 2012-02-25: qty 12

## 2012-02-25 MED ORDER — DEXAMETHASONE SODIUM PHOSPHATE 10 MG/ML IJ SOLN
10.0000 mg | Freq: Once | INTRAMUSCULAR | Status: AC
  Administered 2012-02-25: 10 mg via INTRAVENOUS

## 2012-02-25 MED ORDER — ONDANSETRON 8 MG/50ML IVPB (CHCC)
8.0000 mg | Freq: Once | INTRAVENOUS | Status: AC
  Administered 2012-02-25: 8 mg via INTRAVENOUS

## 2012-02-25 NOTE — Patient Instructions (Signed)
[facility] Discharge Instructions for Patients Receiving Chemotherapy  Today you received the following chemotherapy agents arsnic  To help prevent nausea and vomiting after your treatment, we encourage you to take your nausea medication  and take it as often as prescribed   If you develop nausea and vomiting that is not controlled by your nausea medication, call the clinic. If it is after clinic hours your family physician or the after hours number for the clinic or go to the Emergency Department.   BELOW ARE SYMPTOMS THAT SHOULD BE REPORTED IMMEDIATELY:  *FEVER GREATER THAN 100.5 F  *CHILLS WITH OR WITHOUT FEVER  NAUSEA AND VOMITING THAT IS NOT CONTROLLED WITH YOUR NAUSEA MEDICATION  *UNUSUAL SHORTNESS OF BREATH  *UNUSUAL BRUISING OR BLEEDING  TENDERNESS IN MOUTH AND THROAT WITH OR WITHOUT PRESENCE OF ULCERS  *URINARY PROBLEMS  *BOWEL PROBLEMS  UNUSUAL RASH Items with * indicate a potential emergency and should be followed up as soon as possible.  One of the nurses will contact you 24 hours after your treatment. Please let the nurse know about any problems that you may have experienced. Feel free to call the clinic you have any questions or concerns. The clinic phone number is [phone].   I have been informed and understand all the instructions given to me. I know to contact the clinic, my physician, or go to the Emergency Department if any problems should occur. I do not have any questions at this time, but understand that I may call the clinic during office hours   should I have any questions or need assistance in obtaining follow up care.    __________________________________________  _____________  __________ Signature of Patient or Authorized Representative            Date                   Time    __________________________________________ Nurse's Signature    

## 2012-02-26 ENCOUNTER — Ambulatory Visit (HOSPITAL_BASED_OUTPATIENT_CLINIC_OR_DEPARTMENT_OTHER): Payer: Medicaid Other

## 2012-02-26 ENCOUNTER — Encounter: Payer: Self-pay | Admitting: *Deleted

## 2012-02-26 VITALS — BP 123/82 | HR 82 | Temp 98.8°F

## 2012-02-26 DIAGNOSIS — Z5111 Encounter for antineoplastic chemotherapy: Secondary | ICD-10-CM

## 2012-02-26 DIAGNOSIS — C9200 Acute myeloblastic leukemia, not having achieved remission: Secondary | ICD-10-CM

## 2012-02-26 DIAGNOSIS — C9240 Acute promyelocytic leukemia, not having achieved remission: Secondary | ICD-10-CM

## 2012-02-26 MED ORDER — SODIUM CHLORIDE 0.9 % IV SOLN
Freq: Once | INTRAVENOUS | Status: AC
  Administered 2012-02-26: 13:00:00 via INTRAVENOUS

## 2012-02-26 MED ORDER — DEXAMETHASONE SODIUM PHOSPHATE 10 MG/ML IJ SOLN
10.0000 mg | Freq: Once | INTRAMUSCULAR | Status: AC
  Administered 2012-02-26: 10 mg via INTRAVENOUS

## 2012-02-26 MED ORDER — HEPARIN SOD (PORK) LOCK FLUSH 100 UNIT/ML IV SOLN
500.0000 [IU] | Freq: Once | INTRAVENOUS | Status: AC | PRN
  Administered 2012-02-26: 500 [IU]
  Filled 2012-02-26: qty 5

## 2012-02-26 MED ORDER — SODIUM CHLORIDE 0.9 % IJ SOLN
10.0000 mL | INTRAMUSCULAR | Status: DC | PRN
  Administered 2012-02-26: 10 mL
  Filled 2012-02-26: qty 10

## 2012-02-26 MED ORDER — SODIUM CHLORIDE 0.9 % IV SOLN
0.1500 mg/kg/d | Freq: Every day | INTRAVENOUS | Status: DC
  Administered 2012-02-26: 12 mg via INTRAVENOUS
  Filled 2012-02-26: qty 12

## 2012-02-26 MED ORDER — ONDANSETRON 8 MG/50ML IVPB (CHCC)
8.0000 mg | Freq: Once | INTRAVENOUS | Status: AC
  Administered 2012-02-26: 8 mg via INTRAVENOUS

## 2012-02-26 NOTE — Patient Instructions (Signed)
[facility] Discharge Instructions for Patients Receiving Chemotherapy  Today you received the following chemotherapy agents Arsenic Trioxide  To help prevent nausea and vomiting after your treatment, we encourage you to take your nausea medication  Begin taking it at 7pm and take it as often as prescribed for the next 24-72 hours.   If you develop nausea and vomiting that is not controlled by your nausea medication, call the clinic. If it is after clinic hours your family physician or the after hours number for the clinic or go to the Emergency Department.   BELOW ARE SYMPTOMS THAT SHOULD BE REPORTED IMMEDIATELY:  *FEVER GREATER THAN 100.5 F  *CHILLS WITH OR WITHOUT FEVER  NAUSEA AND VOMITING THAT IS NOT CONTROLLED WITH YOUR NAUSEA MEDICATION  *UNUSUAL SHORTNESS OF BREATH  *UNUSUAL BRUISING OR BLEEDING  TENDERNESS IN MOUTH AND THROAT WITH OR WITHOUT PRESENCE OF ULCERS  *URINARY PROBLEMS  *BOWEL PROBLEMS  UNUSUAL RASH Items with * indicate a potential emergency and should be followed up as soon as possible.  One of the nurses will contact you 24 hours after your treatment. Please let the nurse know about any problems that you may have experienced. Feel free to call the clinic you have any questions or concerns. The clinic phone number is [phone].   I have been informed and understand all the instructions given to me. I know to contact the clinic, my physician, or go to the Emergency Department if any problems should occur. I do not have any questions at this time, but understand that I may call the clinic during office hours   should I have any questions or need assistance in obtaining follow up care.    __________________________________________  _____________  __________ Signature of Patient or Authorized Representative            Date                   Time    __________________________________________ Nurse's Signature

## 2012-02-26 NOTE — Progress Notes (Unsigned)
Provided Ms. Grossi with financial assistance resources in the community, along with my contact information if she has any questions or concerns.  Gretta Cool, Marine scientist, Clinical Social Work

## 2012-02-29 ENCOUNTER — Ambulatory Visit (HOSPITAL_BASED_OUTPATIENT_CLINIC_OR_DEPARTMENT_OTHER): Payer: Medicaid Other | Admitting: Physician Assistant

## 2012-02-29 ENCOUNTER — Other Ambulatory Visit: Payer: Self-pay

## 2012-02-29 ENCOUNTER — Ambulatory Visit (HOSPITAL_BASED_OUTPATIENT_CLINIC_OR_DEPARTMENT_OTHER): Payer: Medicaid Other

## 2012-02-29 ENCOUNTER — Other Ambulatory Visit (HOSPITAL_COMMUNITY): Payer: Self-pay | Admitting: Oncology

## 2012-02-29 ENCOUNTER — Encounter: Payer: Self-pay | Admitting: Physician Assistant

## 2012-02-29 ENCOUNTER — Other Ambulatory Visit (HOSPITAL_BASED_OUTPATIENT_CLINIC_OR_DEPARTMENT_OTHER): Payer: Medicaid Other | Admitting: Lab

## 2012-02-29 ENCOUNTER — Other Ambulatory Visit: Payer: Self-pay | Admitting: *Deleted

## 2012-02-29 VITALS — BP 124/83 | HR 92 | Temp 97.8°F | Ht 62.0 in | Wt 272.4 lb

## 2012-02-29 DIAGNOSIS — C9240 Acute promyelocytic leukemia, not having achieved remission: Secondary | ICD-10-CM

## 2012-02-29 DIAGNOSIS — E119 Type 2 diabetes mellitus without complications: Secondary | ICD-10-CM

## 2012-02-29 DIAGNOSIS — C9200 Acute myeloblastic leukemia, not having achieved remission: Secondary | ICD-10-CM

## 2012-02-29 DIAGNOSIS — E139 Other specified diabetes mellitus without complications: Secondary | ICD-10-CM

## 2012-02-29 DIAGNOSIS — Z5111 Encounter for antineoplastic chemotherapy: Secondary | ICD-10-CM

## 2012-02-29 DIAGNOSIS — Z5320 Procedure and treatment not carried out because of patient's decision for unspecified reasons: Secondary | ICD-10-CM

## 2012-02-29 LAB — CBC WITH DIFFERENTIAL/PLATELET
Basophils Absolute: 0 10*3/uL (ref 0.0–0.1)
Eosinophils Absolute: 0.4 10*3/uL (ref 0.0–0.5)
HGB: 11 g/dL — ABNORMAL LOW (ref 11.6–15.9)
MCV: 85.1 fL (ref 79.5–101.0)
NEUT#: 7 10*3/uL — ABNORMAL HIGH (ref 1.5–6.5)
RDW: 16.3 % — ABNORMAL HIGH (ref 11.2–14.5)
lymph#: 2.4 10*3/uL (ref 0.9–3.3)

## 2012-02-29 LAB — COMPREHENSIVE METABOLIC PANEL
Albumin: 4.1 g/dL (ref 3.5–5.2)
CO2: 23 mEq/L (ref 19–32)
Glucose, Bld: 245 mg/dL — ABNORMAL HIGH (ref 70–99)
Potassium: 3.5 mEq/L (ref 3.5–5.3)
Sodium: 137 mEq/L (ref 135–145)
Total Protein: 7.3 g/dL (ref 6.0–8.3)

## 2012-02-29 LAB — MAGNESIUM: Magnesium: 2 mg/dL (ref 1.5–2.5)

## 2012-02-29 MED ORDER — DEXAMETHASONE SODIUM PHOSPHATE 10 MG/ML IJ SOLN
10.0000 mg | Freq: Once | INTRAMUSCULAR | Status: AC
  Administered 2012-02-29: 10 mg via INTRAVENOUS

## 2012-02-29 MED ORDER — ONDANSETRON 8 MG/50ML IVPB (CHCC)
8.0000 mg | Freq: Once | INTRAVENOUS | Status: AC
  Administered 2012-02-29: 8 mg via INTRAVENOUS

## 2012-02-29 MED ORDER — MICONAZOLE NITRATE 4 % VA CREA: 1.0000 "application " | TOPICAL_CREAM | VAGINAL | Status: DC

## 2012-02-29 MED ORDER — SODIUM CHLORIDE 0.9 % IV SOLN
Freq: Once | INTRAVENOUS | Status: AC
  Administered 2012-02-29: 13:00:00 via INTRAVENOUS

## 2012-02-29 MED ORDER — SODIUM CHLORIDE 0.9 % IJ SOLN
10.0000 mL | INTRAMUSCULAR | Status: DC | PRN
  Filled 2012-02-29: qty 10

## 2012-02-29 MED ORDER — GLUCOSE BLOOD VI STRP: ORAL_STRIP | Status: AC

## 2012-02-29 MED ORDER — CEPHALEXIN 500 MG PO CAPS: 500.0000 mg | ORAL_CAPSULE | Freq: Three times a day (TID) | ORAL | Status: DC

## 2012-02-29 MED ORDER — SODIUM CHLORIDE 0.9 % IV SOLN
0.1500 mg/kg/d | Freq: Every day | INTRAVENOUS | Status: DC
  Filled 2012-02-29: qty 12

## 2012-02-29 NOTE — Progress Notes (Signed)
Hematology and Oncology Follow Up Visit  Melissa Washington [id number] 07/22/1986 25 y.o. 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  2. Type 2 diabetes mellitus.  3. Recent hospitalization for cholecystitis for which she subsequently underwent a laparoscopic cholecystectomy.  4. History of Klebsiella bacteremia.  Interim History:   Melissa Washington is seen today prior to initiating day 8, cycle 2 of arsenic trioxide based therapy given concomitantly with ATRA 50 mg by mouth twice a day for APL.  She voices no complaints today, no fevers, chills, or night sweats. No shortness of breath or chest pain. No bleeding or bruising symptoms. Energy level is actually quite good. She is not experiencing any unexplained weight loss, no drenching night sweats. She denies nausea, emesis, diarrhea, or constipation issues. A detailed review of systems is otherwise noncontributory as noted below.  Review of Systems: Constitutional:  no weight loss, fever, night sweats and feels well Eyes: no complaints ENT: no complaints Cardiovascular: no chest pain or dyspnea on exertion Respiratory: no cough, shortness of breath, or wheezing Neurological: no TIA or stroke symptoms Dermatological: negative Gastrointestinal: no abdominal pain, change in bowel habits, or black or bloody stools Genito-Urinary: no dysuria, trouble voiding, or hematuria Hematological and Lymphatic: negative Breast: negative Musculoskeletal: negative Remaining ROS negative.  Medications:   I have reviewed the patient's current medications.  Current Outpatient Prescriptions  Medication Sig Dispense Refill  . insulin glargine (LANTUS) 100 UNIT/ML injection Inject 35 Units into the skin daily.  1 vial  12  . Insulin Syringe-Needle U-100 26G X 1/2" 1 ML  MISC 35 Units by Does not apply route AC breakfast.  100 each  6  . lisinopril (PRINIVIL,ZESTRIL) 10 MG tablet Take 1 tablet (10 mg total) by mouth daily.  30 tablet  4  . magnesium chloride (SLOW-MAG) 64 MG TBEC Take 2 tablets by mouth daily.      . metFORMIN (GLUCOPHAGE) 1000 MG tablet Take 1 tablet (1,000 mg total) by mouth daily with breakfast.  90 tablet  4  . promethazine (PHENERGAN) 12.5 MG tablet Take 12.5 mg by mouth every 8 (eight) hours as needed. For nausea      . tretinoin (VESANOID) 10 MG capsule Take 50 mg by mouth 2 (two) times daily.      . cephALEXin (KEFLEX) 500 MG capsule Take 1 capsule (500 mg total) by mouth 3 (three) times daily.  21 capsule  0  . glucose blood (ACCU-CHEK ACTIVE STRIPS) test strip Use as instructed  100 each  12  . MICONAZOLE NITRATE VAGINAL (MONISTAT 3) 4 % CREA Place 1 application vaginally as directed.  1 Tube  0  . DISCONTD: glipiZIDE (GLUCOTROL) 5 MG tablet Take 1 tablet (5 mg total) by mouth 2 (two) times daily before a meal.  60 tablet  0   No current facility-administered medications for this visit.   Facility-Administered Medications Ordered in Other Visits  Medication Dose Route Frequency Provider Last Rate Last Dose  . 0.9 %  sodium chloride infusion   Intravenous Once Pierce Crane, MD 20 mL/hr at 02/29/12 1258    . arsenic trioxide (TRISENOX) 12 mg in sodium chloride 0.9 % 250 mL chemo Infusion  0.15 mg/kg/day (Treatment Plan Adjusted) Intravenous Daily Pierce Crane, MD      . dexamethasone (DECADRON) injection 10 mg  10 mg Intravenous Once Pierce Crane, MD   10 mg at 02/29/12 1258  . ondansetron (ZOFRAN) IVPB 8 mg  8 mg Intravenous Once Pierce Crane, MD   8 mg at 02/29/12 1258  . sodium chloride 0.9 % injection 10 mL  10 mL Intracatheter PRN Pierce Crane, MD        Allergies:  Allergies  Allergen Reactions  . Ultram (Tramadol Hcl) Nausea And Vomiting    Physical Exam: Filed Vitals:   02/29/12 1059  BP: 124/83  Pulse: 92  Temp: 97.8 F  (36.6 C)    Body mass index is 49.82 kg/(m^2). Weight: 272 lbs. HEENT:  Sclerae anicteric, conjunctivae pink.  Oropharynx clear.  No mucositis or candidiasis.   Nodes:  No cervical, supraclavicular, or axillary lymphadenopathy palpated.   Lungs:  Clear to auscultation bilaterally.  No crackles, rhonchi, or wheezes.   Heart:  Regular rate and rhythm. Her left anterior chest wall PORT site shows evidence of mild wound dehiscence, no evidence of erythema. Abdomen:  Soft, nontender.  Positive bowel sounds.  No organomegaly or masses palpated.   Musculoskeletal:  No focal spinal tenderness to palpation.  Extremities:  Benign.  No peripheral edema or cyanosis.   Skin:  Benign.   Neuro:  Nonfocal, alert and oriented x 3   Lab Results: Lab Results  Component Value Date   WBC 10.4* 02/29/2012   HGB 11.0* 02/29/2012   HCT 32.0* 02/29/2012   MCV 85.1 02/29/2012   PLT 241 02/29/2012   NEUTROABS 7.0* 02/29/2012     Chemistry      Component Value Date/Time   NA 137 02/29/2012 1026   K 3.5 02/29/2012 1026   CL 102 02/29/2012 1026   CO2 23 02/29/2012 1026   BUN 16 02/29/2012 1026   CREATININE 0.60 02/29/2012 1026      Component Value Date/Time   CALCIUM 9.7 02/29/2012 1026   ALKPHOS 86 02/29/2012 1026   AST 11 02/29/2012 1026   ALT 11 02/29/2012 1026   BILITOT 0.7 02/29/2012 1026      No results found for this basename: LABCA2     12 lead EKG: Obtained 02/29/2012--No evidence of QT interval elongation. (447 ms)  Assessment:  A 26 year old [place] woman with acute myelocytic leukemia due for day 8, cycle 2 of arsenic trioxide given on days 1-5, 8-12 on a q. 28 day regimen.  Oral ATRA per protocol 50mg  orally twice a day.  2. Diabetes mellitus type 2  3. Recent cholecystitis/laparoscopic cholecystectomy  4. History of Klebsiella bacteremia  5. History of hypomagnesemia, magnesium level today 2.0  6. Reported vaginal candidosis  7. Evidence of mild PORT site wound  dehiscence.   This case reviewed with Dr. Pierce Crane.   Plan:  Patient will initiate day 8, cycle 2 of her arsenic-based regimen given with ATRA for APL, a peripheral access 4 days 8-12, and prescription for Keflex 500 mg by mouth 3 times a day for 7 days will be called in. She will also obtain Monistat for the vaginal candidosis, and obtain her glucometer through [employer].  We will be rechecking a CBC, and STAT serum chemistry along with serum magnesium level on 03/03/12 prior to day 11, cycle 2.  She will be seen in one week's time prior to day 15,  cycle 2, to include CBC, serum chemistry.   This plan was reviewed with the patient, who voices understanding and agreement.  She knows to call with any  changes or problems.    Baylen Buckner T, PA-C 02/29/12

## 2012-02-29 NOTE — Patient Instructions (Signed)
[facility] Discharge Instructions for Patients Receiving Chemotherapy  Today you received the following chemotherapy agents Arsenic Trioxide  To help prevent nausea and vomiting after your treatment, we encourage you to take your nausea medication Begin taking it at 7 pm and take it as often as prescribed for the next 24 to 72 hours.   If you develop nausea and vomiting that is not controlled by your nausea medication, call the clinic. If it is after clinic hours your family physician or the after hours number for the clinic or go to the Emergency Department.   BELOW ARE SYMPTOMS THAT SHOULD BE REPORTED IMMEDIATELY:  *FEVER GREATER THAN 100.5 F  *CHILLS WITH OR WITHOUT FEVER  NAUSEA AND VOMITING THAT IS NOT CONTROLLED WITH YOUR NAUSEA MEDICATION  *UNUSUAL SHORTNESS OF BREATH  *UNUSUAL BRUISING OR BLEEDING  TENDERNESS IN MOUTH AND THROAT WITH OR WITHOUT PRESENCE OF ULCERS  *URINARY PROBLEMS  *BOWEL PROBLEMS  UNUSUAL RASH Items with * indicate a potential emergency and should be followed up as soon as possible.  One of the nurses will contact you 24 hours after your treatment. Please let the nurse know about any problems that you may have experienced. Feel free to call the clinic you have any questions or concerns. The clinic phone number is [phone].   I have been informed and understand all the instructions given to me. I know to contact the clinic, my physician, or go to the Emergency Department if any problems should occur. I do not have any questions at this time, but understand that I may call the clinic during office hours   should I have any questions or need assistance in obtaining follow up care.    __________________________________________  _____________  __________ Signature of Patient or Authorized Representative            Date                   Time    __________________________________________ Nurse's Signature    

## 2012-02-29 NOTE — Progress Notes (Signed)
Patient arrived at the infusion center at approximately 1130 am. She ate lunch. Then requested more food. Simultaneously, Wynona Canes called and stated that she wanted Ms. Haglund  to  measure her blood glucose every morning before she eats breakfast. I asked Ms. Kistler if she needed a demonstration on checking her blood glucose. She replied no. And continued to eat.  Abruptly, she indicated that she needed to leave to go pick up her daughter that the chemotherapy was taking too long. I spoke with the pharmacist and she explained that she was waiting on laboratory results. The pharmacist explained to me the correlation between Ms. Garceau's blood chemistry, EKG, cardiac performance and chemo administration (arsenic trioxide).Marland KitchenMarland KitchenMarland KitchenI tried to explain this information to Ms. Dorsainvil in simple terms. Ms. Leitz still wanted to leave...she stated it would be up to "Dr. Donnie Coffin to reschedule her treatments." Before she made her final decision to forgo her treatment, the pharmacist, a scheduler and a charge nurse all spoke with Ms. Tremaine.  Ms. Pund became a little verbally aggressive. Sharyl Nimrod was called to let her know that Ms. Blayney declined her treatment.

## 2012-03-01 ENCOUNTER — Telehealth: Payer: Self-pay | Admitting: *Deleted

## 2012-03-01 ENCOUNTER — Other Ambulatory Visit (HOSPITAL_BASED_OUTPATIENT_CLINIC_OR_DEPARTMENT_OTHER): Payer: Medicaid Other | Admitting: Lab

## 2012-03-01 ENCOUNTER — Ambulatory Visit (HOSPITAL_BASED_OUTPATIENT_CLINIC_OR_DEPARTMENT_OTHER): Payer: Medicaid Other

## 2012-03-01 ENCOUNTER — Encounter: Payer: Self-pay | Admitting: Oncology

## 2012-03-01 ENCOUNTER — Other Ambulatory Visit: Payer: Self-pay | Admitting: Physician Assistant

## 2012-03-01 VITALS — BP 134/76 | HR 77 | Temp 97.4°F

## 2012-03-01 DIAGNOSIS — E119 Type 2 diabetes mellitus without complications: Secondary | ICD-10-CM

## 2012-03-01 DIAGNOSIS — C9240 Acute promyelocytic leukemia, not having achieved remission: Secondary | ICD-10-CM

## 2012-03-01 DIAGNOSIS — C9200 Acute myeloblastic leukemia, not having achieved remission: Secondary | ICD-10-CM

## 2012-03-01 DIAGNOSIS — Z5111 Encounter for antineoplastic chemotherapy: Secondary | ICD-10-CM

## 2012-03-01 LAB — CBC WITH DIFFERENTIAL/PLATELET
BASO%: 0.3 % (ref 0.0–2.0)
HCT: 32.3 % — ABNORMAL LOW (ref 34.8–46.6)
MCHC: 32.5 g/dL (ref 31.5–36.0)
MONO#: 0.7 10*3/uL (ref 0.1–0.9)
NEUT%: 82.1 % — ABNORMAL HIGH (ref 38.4–76.8)
RBC: 3.54 10*6/uL — ABNORMAL LOW (ref 3.70–5.45)
WBC: 10.6 10*3/uL — ABNORMAL HIGH (ref 3.9–10.3)
lymph#: 1.2 10*3/uL (ref 0.9–3.3)

## 2012-03-01 LAB — COMPREHENSIVE METABOLIC PANEL
ALT: 11 U/L (ref 0–35)
Albumin: 4.2 g/dL (ref 3.5–5.2)
CO2: 16 mEq/L — ABNORMAL LOW (ref 19–32)
Calcium: 10.3 mg/dL (ref 8.4–10.5)
Chloride: 95 mEq/L — ABNORMAL LOW (ref 96–112)
Potassium: 4.2 mEq/L (ref 3.5–5.3)
Sodium: 132 mEq/L — ABNORMAL LOW (ref 135–145)
Total Protein: 7.6 g/dL (ref 6.0–8.3)

## 2012-03-01 LAB — MAGNESIUM: Magnesium: 2.2 mg/dL (ref 1.5–2.5)

## 2012-03-01 MED ORDER — ONDANSETRON 8 MG/50ML IVPB (CHCC)
8.0000 mg | Freq: Once | INTRAVENOUS | Status: AC
  Administered 2012-03-01: 8 mg via INTRAVENOUS

## 2012-03-01 MED ORDER — SODIUM CHLORIDE 0.9 % IV SOLN
Freq: Once | INTRAVENOUS | Status: AC
  Administered 2012-03-01: 09:00:00 via INTRAVENOUS

## 2012-03-01 MED ORDER — DEXAMETHASONE SODIUM PHOSPHATE 10 MG/ML IJ SOLN
10.0000 mg | Freq: Once | INTRAMUSCULAR | Status: AC
  Administered 2012-03-01: 10 mg via INTRAVENOUS

## 2012-03-01 MED ORDER — INSULIN REGULAR HUMAN 100 UNIT/ML IJ SOLN
8.0000 [IU] | Freq: Once | INTRAMUSCULAR | Status: AC
  Administered 2012-03-01: 8 [IU] via SUBCUTANEOUS
  Filled 2012-03-01: qty 0.08

## 2012-03-01 MED ORDER — ARSENIC TRIOXIDE CHEMO INJECTION 10MG/10ML
0.1500 mg/kg/d | Freq: Every day | INTRAVENOUS | Status: DC
  Administered 2012-03-01: 12 mg via INTRAVENOUS
  Filled 2012-03-01: qty 12

## 2012-03-01 NOTE — Telephone Encounter (Signed)
gave patient appointment for 03-07-2012 starting at 11:15am printed out calendar and gave patient in the chemo room

## 2012-03-01 NOTE — Patient Instructions (Addendum)
[facility] Discharge Instructions for Patients Receiving Chemotherapy  Today you received the following chemotherapy agents Aresenic Trioxide  To help prevent nausea and vomiting after your treatment, we encourage you to take your nausea medication as prescribed.   If you develop nausea and vomiting that is not controlled by your nausea medication, call the clinic. If it is after clinic hours your family physician or the after hours number for the clinic or go to the Emergency Department.   BELOW ARE SYMPTOMS THAT SHOULD BE REPORTED IMMEDIATELY:  *FEVER GREATER THAN 100.5 F  *CHILLS WITH OR WITHOUT FEVER  NAUSEA AND VOMITING THAT IS NOT CONTROLLED WITH YOUR NAUSEA MEDICATION  *UNUSUAL SHORTNESS OF BREATH  *UNUSUAL BRUISING OR BLEEDING  TENDERNESS IN MOUTH AND THROAT WITH OR WITHOUT PRESENCE OF ULCERS  *URINARY PROBLEMS  *BOWEL PROBLEMS  UNUSUAL RASH Items with * indicate a potential emergency and should be followed up as soon as possible.  Feel free to call the clinic you have any questions or concerns. The clinic phone number is [phone].   I have been informed and understand all the instructions given to me. I know to contact the clinic, my physician, or go to the Emergency Department if any problems should occur. I do not have any questions at this time, but understand that I may call the clinic during office hours   should I have any questions or need assistance in obtaining follow up care.    __________________________________________  _____________  __________ Signature of Patient or Authorized Representative            Date                   Time    __________________________________________ Nurse's Signature

## 2012-03-01 NOTE — Progress Notes (Signed)
Patient came in today with concerns about paying rent and light bill,  Patient is on medicaid, I gave patient epp application to fill out patient has no income but can get a letter of support. I explained the process to the patient and told her that we would see what we could do to help her . Patient was very please with out come of meeting. She stated that she would get application back as soon as possible. She also stated that her fiance is not with her right now he is rehab and she doesn't have money right now to pay for rent and lights. She stated that her grand pa would probably help pay her light bill. I told the patient that if she qualifies that their is a grant that can help pay her rent but it was an one time grant, she was ok with that she stated anything that could be done to help she would appreciate.

## 2012-03-01 NOTE — Telephone Encounter (Signed)
gave medical records fax cover sheet and needs to fax over the last office note to [phone] gave to medical records on 03-01-2012

## 2012-03-01 NOTE — Progress Notes (Signed)
In for treatment, pt reports she has not yet picked up glucometer-- but plans to do so today. Teaching reinforced re: hyperglycemia, monitoring and meal choices. Pt will need reinforcement of teaching. Steri-strips at port a cath site peeling.  Steri-strips reapplied to port a cath site. Dr. Donnie Coffin in to see pt. Diabetes management teaching reinforced. Pt will be referred to Dr. Lucianne Muss for diabetic management. Referral to [facility] dietician to assist pt with meal planning and snack choices.

## 2012-03-02 ENCOUNTER — Ambulatory Visit (HOSPITAL_BASED_OUTPATIENT_CLINIC_OR_DEPARTMENT_OTHER): Payer: Medicaid Other

## 2012-03-02 ENCOUNTER — Other Ambulatory Visit: Payer: Self-pay | Admitting: Physician Assistant

## 2012-03-02 ENCOUNTER — Other Ambulatory Visit: Payer: Self-pay | Admitting: *Deleted

## 2012-03-02 VITALS — BP 113/66 | HR 93 | Temp 97.6°F

## 2012-03-02 DIAGNOSIS — C9240 Acute promyelocytic leukemia, not having achieved remission: Secondary | ICD-10-CM

## 2012-03-02 DIAGNOSIS — C9200 Acute myeloblastic leukemia, not having achieved remission: Secondary | ICD-10-CM

## 2012-03-02 DIAGNOSIS — Z5111 Encounter for antineoplastic chemotherapy: Secondary | ICD-10-CM

## 2012-03-02 MED ORDER — ONDANSETRON 8 MG/50ML IVPB (CHCC)
8.0000 mg | Freq: Once | INTRAVENOUS | Status: AC
  Administered 2012-03-02: 8 mg via INTRAVENOUS

## 2012-03-02 MED ORDER — SODIUM CHLORIDE 0.9 % IV SOLN
Freq: Once | INTRAVENOUS | Status: AC
  Administered 2012-03-02: 09:00:00 via INTRAVENOUS

## 2012-03-02 MED ORDER — SODIUM CHLORIDE 0.9 % IV SOLN
0.1500 mg/kg/d | Freq: Every day | INTRAVENOUS | Status: DC
  Administered 2012-03-02: 12 mg via INTRAVENOUS
  Filled 2012-03-02: qty 12

## 2012-03-02 MED ORDER — DEXAMETHASONE SODIUM PHOSPHATE 10 MG/ML IJ SOLN
10.0000 mg | Freq: Once | INTRAMUSCULAR | Status: AC
  Administered 2012-03-02: 10 mg via INTRAVENOUS

## 2012-03-02 NOTE — Patient Instructions (Signed)
[facility] Discharge Instructions for Patients Receiving Chemotherapy  Today you received the following chemotherapy agent Arsenic Trioxide  To help prevent nausea and vomiting after your treatment, we encourage you to take your nausea medication. Begin taking it as often as prescribed for Dr. Donnie Coffin.    If you develop nausea and vomiting that is not controlled by your nausea medication, call the clinic. If it is after clinic hours your family physician or the after hours number for the clinic or go to the Emergency Department.   BELOW ARE SYMPTOMS THAT SHOULD BE REPORTED IMMEDIATELY:  *FEVER GREATER THAN 100.5 F  *CHILLS WITH OR WITHOUT FEVER  NAUSEA AND VOMITING THAT IS NOT CONTROLLED WITH YOUR NAUSEA MEDICATION  *UNUSUAL SHORTNESS OF BREATH  *UNUSUAL BRUISING OR BLEEDING  TENDERNESS IN MOUTH AND THROAT WITH OR WITHOUT PRESENCE OF ULCERS  *URINARY PROBLEMS  *BOWEL PROBLEMS  UNUSUAL RASH Items with * indicate a potential emergency and should be followed up as soon as possible.  One of the nurses will contact you 24 hours after your treatment. Please let the nurse know about any problems that you may have experienced. Feel free to call the clinic you have any questions or concerns. The clinic phone number is [phone].   I have been informed and understand all the instructions given to me. I know to contact the clinic, my physician, or go to the Emergency Department if any problems should occur. I do not have any questions at this time, but understand that I may call the clinic during office hours   should I have any questions or need assistance in obtaining follow up care.    __________________________________________  _____________  __________ Signature of Patient or Authorized Representative            Date                   Time    __________________________________________ Nurse's Signature

## 2012-03-02 NOTE — Telephone Encounter (Signed)
Pt. Here for treatment and requests more vesinoid.   Bottle of #100 of 10mg  tablets given to patient in infusion.

## 2012-03-03 ENCOUNTER — Encounter: Payer: Self-pay | Admitting: Oncology

## 2012-03-03 ENCOUNTER — Other Ambulatory Visit: Payer: Self-pay | Admitting: Physician Assistant

## 2012-03-03 ENCOUNTER — Ambulatory Visit (HOSPITAL_BASED_OUTPATIENT_CLINIC_OR_DEPARTMENT_OTHER): Payer: Medicaid Other

## 2012-03-03 ENCOUNTER — Other Ambulatory Visit (HOSPITAL_BASED_OUTPATIENT_CLINIC_OR_DEPARTMENT_OTHER): Payer: Medicaid Other | Admitting: Lab

## 2012-03-03 VITALS — BP 108/75 | HR 93 | Temp 98.8°F

## 2012-03-03 DIAGNOSIS — C9240 Acute promyelocytic leukemia, not having achieved remission: Secondary | ICD-10-CM

## 2012-03-03 DIAGNOSIS — C9200 Acute myeloblastic leukemia, not having achieved remission: Secondary | ICD-10-CM

## 2012-03-03 DIAGNOSIS — Z5111 Encounter for antineoplastic chemotherapy: Secondary | ICD-10-CM

## 2012-03-03 LAB — CBC WITH DIFFERENTIAL/PLATELET
BASO%: 0.1 % (ref 0.0–2.0)
EOS%: 0.5 % (ref 0.0–7.0)
HCT: 32.3 % — ABNORMAL LOW (ref 34.8–46.6)
LYMPH%: 28 % (ref 14.0–49.7)
MCH: 29.1 pg (ref 25.1–34.0)
MCHC: 34.4 g/dL (ref 31.5–36.0)
MCV: 84.6 fL (ref 79.5–101.0)
NEUT%: 63.5 % (ref 38.4–76.8)
Platelets: 291 10*3/uL (ref 145–400)

## 2012-03-03 LAB — COMPREHENSIVE METABOLIC PANEL
ALT: 12 U/L (ref 0–35)
Alkaline Phosphatase: 91 U/L (ref 39–117)
CO2: 21 mEq/L (ref 19–32)
Chloride: 97 mEq/L (ref 96–112)
Glucose, Bld: 326 mg/dL — ABNORMAL HIGH (ref 70–99)
Potassium: 3.9 mEq/L (ref 3.5–5.3)
Total Bilirubin: 0.6 mg/dL (ref 0.3–1.2)
Total Protein: 8 g/dL (ref 6.0–8.3)

## 2012-03-03 MED ORDER — SODIUM CHLORIDE 0.9 % IV SOLN
Freq: Once | INTRAVENOUS | Status: AC
  Administered 2012-03-03: 14:00:00 via INTRAVENOUS

## 2012-03-03 MED ORDER — ONDANSETRON 8 MG/50ML IVPB (CHCC)
8.0000 mg | Freq: Once | INTRAVENOUS | Status: AC
  Administered 2012-03-03: 8 mg via INTRAVENOUS

## 2012-03-03 MED ORDER — SODIUM CHLORIDE 0.9 % IV SOLN
0.1500 mg/kg/d | Freq: Every day | INTRAVENOUS | Status: DC
  Administered 2012-03-03: 12 mg via INTRAVENOUS
  Filled 2012-03-03: qty 12

## 2012-03-03 MED ORDER — DEXAMETHASONE SODIUM PHOSPHATE 10 MG/ML IJ SOLN
10.0000 mg | Freq: Once | INTRAMUSCULAR | Status: AC
  Administered 2012-03-03: 10 mg via INTRAVENOUS

## 2012-03-03 NOTE — Patient Instructions (Signed)
[facility] Discharge Instructions for Patients Receiving Chemotherapy  Today you received the following chemotherapy agents --Arsenic.  To help prevent nausea and vomiting after your treatment, we encourage you to take your nausea medication   If you develop nausea and vomiting that is not controlled by your nausea medication, call the clinic. If it is after clinic hours your family physician or the after hours number for the clinic or go to the Emergency Department.   BELOW ARE SYMPTOMS THAT SHOULD BE REPORTED IMMEDIATELY:  *FEVER GREATER THAN 100.5 F  *CHILLS WITH OR WITHOUT FEVER  NAUSEA AND VOMITING THAT IS NOT CONTROLLED WITH YOUR NAUSEA MEDICATION  *UNUSUAL SHORTNESS OF BREATH  *UNUSUAL BRUISING OR BLEEDING  TENDERNESS IN MOUTH AND THROAT WITH OR WITHOUT PRESENCE OF ULCERS  *URINARY PROBLEMS  *BOWEL PROBLEMS  UNUSUAL RASH Items with * indicate a potential emergency and should be followed up as soon as possible.  One of the nurses will contact you 24 hours after your treatment. Please let the nurse know about any problems that you may have experienced. Feel free to call the clinic you have any questions or concerns. The clinic phone number is [phone].   I have been informed and understand all the instructions given to me. I know to contact the clinic, my physician, or go to the Emergency Department if any problems should occur. I do not have any questions at this time, but understand that I may call the clinic during office hours   should I have any questions or need assistance in obtaining follow up care.    __________________________________________  _____________  __________ Signature of Patient or Authorized Representative            Date                   Time    __________________________________________ Nurse's Signature    

## 2012-03-03 NOTE — Progress Notes (Signed)
Patient needed help with rent 200.00 remaining balance in [facility] 182.00

## 2012-03-03 NOTE — Progress Notes (Signed)
Lab called inquiring about CBC for today.  Pt. Did not receive day 1 tmt on Monday.  Per pharmacy, okay to do CBC today. Lab ordered.  HL

## 2012-03-04 ENCOUNTER — Ambulatory Visit (HOSPITAL_BASED_OUTPATIENT_CLINIC_OR_DEPARTMENT_OTHER): Payer: Medicaid Other

## 2012-03-04 ENCOUNTER — Encounter: Payer: Self-pay | Admitting: Oncology

## 2012-03-04 ENCOUNTER — Ambulatory Visit: Payer: Medicaid Other

## 2012-03-04 ENCOUNTER — Other Ambulatory Visit: Payer: Self-pay | Admitting: Physician Assistant

## 2012-03-04 VITALS — BP 110/72 | HR 85 | Temp 98.2°F

## 2012-03-04 DIAGNOSIS — C9240 Acute promyelocytic leukemia, not having achieved remission: Secondary | ICD-10-CM

## 2012-03-04 DIAGNOSIS — Z5111 Encounter for antineoplastic chemotherapy: Secondary | ICD-10-CM

## 2012-03-04 DIAGNOSIS — C9200 Acute myeloblastic leukemia, not having achieved remission: Secondary | ICD-10-CM

## 2012-03-04 MED ORDER — ONDANSETRON 8 MG/50ML IVPB (CHCC)
8.0000 mg | Freq: Once | INTRAVENOUS | Status: AC
  Administered 2012-03-04: 8 mg via INTRAVENOUS

## 2012-03-04 MED ORDER — SODIUM CHLORIDE 0.9 % IV SOLN
0.1500 mg/kg/d | Freq: Every day | INTRAVENOUS | Status: DC
  Administered 2012-03-04: 12 mg via INTRAVENOUS
  Filled 2012-03-04: qty 12

## 2012-03-04 MED ORDER — SODIUM CHLORIDE 0.9 % IV SOLN
Freq: Once | INTRAVENOUS | Status: AC
  Administered 2012-03-04: 09:00:00 via INTRAVENOUS

## 2012-03-04 MED ORDER — DEXAMETHASONE SODIUM PHOSPHATE 10 MG/ML IJ SOLN
10.0000 mg | Freq: Once | INTRAMUSCULAR | Status: AC
  Administered 2012-03-04: 10 mg via INTRAVENOUS

## 2012-03-04 NOTE — Patient Instructions (Addendum)
[facility] Discharge Instructions for Patients Receiving Chemotherapy  Today you received the following chemotherapy agents Arsenic.  To help prevent nausea and vomiting after your treatment, we encourage you to take your nausea medication as ordered per MD.    If you develop nausea and vomiting that is not controlled by your nausea medication, call the clinic. If it is after clinic hours your family physician or the after hours number for the clinic or go to the Emergency Department.   BELOW ARE SYMPTOMS THAT SHOULD BE REPORTED IMMEDIATELY:  *FEVER GREATER THAN 100.5 F  *CHILLS WITH OR WITHOUT FEVER  NAUSEA AND VOMITING THAT IS NOT CONTROLLED WITH YOUR NAUSEA MEDICATION  *UNUSUAL SHORTNESS OF BREATH  *UNUSUAL BRUISING OR BLEEDING  TENDERNESS IN MOUTH AND THROAT WITH OR WITHOUT PRESENCE OF ULCERS  *URINARY PROBLEMS  *BOWEL PROBLEMS  UNUSUAL RASH Items with * indicate a potential emergency and should be followed up as soon as possible.  . Please let the nurse know about any problems that you may have experienced. Feel free to call the clinic you have any questions or concerns. The clinic phone number is [phone].   I have been informed and understand all the instructions given to me. I know to contact the clinic, my physician, or go to the Emergency Department if any problems should occur. I do not have any questions at this time, but understand that I may call the clinic during office hours   should I have any questions or need assistance in obtaining follow up care.    __________________________________________  _____________  __________ Signature of Patient or Authorized Representative            Date                   Time    __________________________________________ Nurse's Signature    

## 2012-03-04 NOTE — Progress Notes (Signed)
PATIENT RECEIVED MEDICATION FROM [facility] 42.67 REMAINING BALANCE 139.33

## 2012-03-07 ENCOUNTER — Ambulatory Visit: Payer: Medicaid Other | Admitting: Physician Assistant

## 2012-03-07 ENCOUNTER — Telehealth: Payer: Self-pay | Admitting: *Deleted

## 2012-03-07 ENCOUNTER — Other Ambulatory Visit: Payer: Medicaid Other | Admitting: Lab

## 2012-03-07 ENCOUNTER — Ambulatory Visit: Payer: Medicaid Other

## 2012-03-07 NOTE — Telephone Encounter (Signed)
left voice message to inform the patient of the new date and time added labs to 03-10-2012 03-14-2012 03-17-2012

## 2012-03-08 ENCOUNTER — Other Ambulatory Visit: Payer: Self-pay | Admitting: Physician Assistant

## 2012-03-08 ENCOUNTER — Encounter: Payer: Self-pay | Admitting: *Deleted

## 2012-03-08 ENCOUNTER — Other Ambulatory Visit: Payer: Self-pay

## 2012-03-08 ENCOUNTER — Ambulatory Visit (HOSPITAL_BASED_OUTPATIENT_CLINIC_OR_DEPARTMENT_OTHER): Payer: Medicaid Other

## 2012-03-08 ENCOUNTER — Ambulatory Visit (HOSPITAL_BASED_OUTPATIENT_CLINIC_OR_DEPARTMENT_OTHER): Payer: Medicaid Other | Admitting: Lab

## 2012-03-08 VITALS — BP 110/76 | HR 85 | Temp 98.0°F

## 2012-03-08 DIAGNOSIS — C9240 Acute promyelocytic leukemia, not having achieved remission: Secondary | ICD-10-CM

## 2012-03-08 DIAGNOSIS — Z5111 Encounter for antineoplastic chemotherapy: Secondary | ICD-10-CM

## 2012-03-08 DIAGNOSIS — C9200 Acute myeloblastic leukemia, not having achieved remission: Secondary | ICD-10-CM

## 2012-03-08 LAB — CBC WITH DIFFERENTIAL/PLATELET
Basophils Absolute: 0.1 10*3/uL (ref 0.0–0.1)
EOS%: 2 % (ref 0.0–7.0)
HCT: 33 % — ABNORMAL LOW (ref 34.8–46.6)
HGB: 11.1 g/dL — ABNORMAL LOW (ref 11.6–15.9)
MCH: 30.1 pg (ref 25.1–34.0)
MCV: 89.3 fL (ref 79.5–101.0)
MONO%: 3.9 % (ref 0.0–14.0)
NEUT%: 69.4 % (ref 38.4–76.8)
Platelets: 231 10*3/uL (ref 145–400)
lymph#: 1.9 10*3/uL (ref 0.9–3.3)

## 2012-03-08 LAB — COMPREHENSIVE METABOLIC PANEL
AST: 20 U/L (ref 0–37)
BUN: 15 mg/dL (ref 6–23)
Calcium: 9.9 mg/dL (ref 8.4–10.5)
Chloride: 102 mEq/L (ref 96–112)
Creatinine, Ser: 0.53 mg/dL (ref 0.50–1.10)

## 2012-03-08 MED ORDER — SODIUM CHLORIDE 0.9 % IJ SOLN
10.0000 mL | INTRAMUSCULAR | Status: DC | PRN
  Administered 2012-03-08: 10 mL
  Filled 2012-03-08: qty 10

## 2012-03-08 MED ORDER — DEXAMETHASONE SODIUM PHOSPHATE 10 MG/ML IJ SOLN
10.0000 mg | Freq: Once | INTRAMUSCULAR | Status: AC
  Administered 2012-03-08: 10 mg via INTRAVENOUS

## 2012-03-08 MED ORDER — HEPARIN SOD (PORK) LOCK FLUSH 100 UNIT/ML IV SOLN
500.0000 [IU] | Freq: Once | INTRAVENOUS | Status: AC | PRN
  Administered 2012-03-08: 500 [IU]
  Filled 2012-03-08: qty 5

## 2012-03-08 MED ORDER — SODIUM CHLORIDE 0.9 % IV SOLN
0.1500 mg/kg/d | Freq: Every day | INTRAVENOUS | Status: DC
  Administered 2012-03-08: 12 mg via INTRAVENOUS
  Filled 2012-03-08: qty 12

## 2012-03-08 MED ORDER — ONDANSETRON 8 MG/50ML IVPB (CHCC)
8.0000 mg | Freq: Once | INTRAVENOUS | Status: AC
  Administered 2012-03-08: 8 mg via INTRAVENOUS

## 2012-03-08 MED ORDER — SODIUM CHLORIDE 0.9 % IV SOLN
Freq: Once | INTRAVENOUS | Status: AC
  Administered 2012-03-08: 11:00:00 via INTRAVENOUS

## 2012-03-08 NOTE — Patient Instructions (Addendum)
[facility] Discharge Instructions for Patients Receiving Chemotherapy  Today you received the following chemotherapy agents Arsenic.  To help prevent nausea and vomiting after your treatment, we encourage you to take your nausea medication as ordered per MD.    If you develop nausea and vomiting that is not controlled by your nausea medication, call the clinic. If it is after clinic hours your family physician or the after hours number for the clinic or go to the Emergency Department.   BELOW ARE SYMPTOMS THAT SHOULD BE REPORTED IMMEDIATELY:  *FEVER GREATER THAN 100.5 F  *CHILLS WITH OR WITHOUT FEVER  NAUSEA AND VOMITING THAT IS NOT CONTROLLED WITH YOUR NAUSEA MEDICATION  *UNUSUAL SHORTNESS OF BREATH  *UNUSUAL BRUISING OR BLEEDING  TENDERNESS IN MOUTH AND THROAT WITH OR WITHOUT PRESENCE OF ULCERS  *URINARY PROBLEMS  *BOWEL PROBLEMS  UNUSUAL RASH Items with * indicate a potential emergency and should be followed up as soon as possible.  . Please let the nurse know about any problems that you may have experienced. Feel free to call the clinic you have any questions or concerns. The clinic phone number is [phone].   I have been informed and understand all the instructions given to me. I know to contact the clinic, my physician, or go to the Emergency Department if any problems should occur. I do not have any questions at this time, but understand that I may call the clinic during office hours   should I have any questions or need assistance in obtaining follow up care.    __________________________________________  _____________  __________ Signature of Patient or Authorized Representative            Date                   Time    __________________________________________ Nurse's Signature    

## 2012-03-08 NOTE — Progress Notes (Unsigned)
0900-Pt has not arrived for treatment today-03/08/12.  Dr. Renelda Loma desk nurse notified.  Able to reach pt.'s brother in law at [phone] and he states that he did see pt yesterday and she "seemed to be doing fine."  Pt.'s brother in law instructed to please have pt call [facility] when he gets in touch with her to re-schedule appointments.

## 2012-03-08 NOTE — Progress Notes (Signed)
0930-Pt arrived late for treatment.  Labs and EKG to be complete.  Dr. Donnie Coffin to see patient.  OK to treat per Dr. Donnie Coffin.

## 2012-03-09 ENCOUNTER — Ambulatory Visit (HOSPITAL_BASED_OUTPATIENT_CLINIC_OR_DEPARTMENT_OTHER): Payer: Medicaid Other

## 2012-03-09 VITALS — BP 123/72 | HR 80 | Temp 97.2°F

## 2012-03-09 DIAGNOSIS — C9200 Acute myeloblastic leukemia, not having achieved remission: Secondary | ICD-10-CM

## 2012-03-09 DIAGNOSIS — Z5111 Encounter for antineoplastic chemotherapy: Secondary | ICD-10-CM

## 2012-03-09 DIAGNOSIS — C9240 Acute promyelocytic leukemia, not having achieved remission: Secondary | ICD-10-CM

## 2012-03-09 MED ORDER — SODIUM CHLORIDE 0.9 % IV SOLN
0.1500 mg/kg/d | Freq: Every day | INTRAVENOUS | Status: DC
  Administered 2012-03-09: 12 mg via INTRAVENOUS
  Filled 2012-03-09: qty 12

## 2012-03-09 MED ORDER — DEXAMETHASONE SODIUM PHOSPHATE 10 MG/ML IJ SOLN
10.0000 mg | Freq: Once | INTRAMUSCULAR | Status: AC
  Administered 2012-03-09: 10 mg via INTRAVENOUS

## 2012-03-09 MED ORDER — HEPARIN SOD (PORK) LOCK FLUSH 100 UNIT/ML IV SOLN
500.0000 [IU] | Freq: Once | INTRAVENOUS | Status: AC | PRN
  Administered 2012-03-09: 500 [IU]
  Filled 2012-03-09: qty 5

## 2012-03-09 MED ORDER — ONDANSETRON 8 MG/50ML IVPB (CHCC)
8.0000 mg | Freq: Once | INTRAVENOUS | Status: AC
  Administered 2012-03-09: 8 mg via INTRAVENOUS

## 2012-03-09 MED ORDER — SODIUM CHLORIDE 0.9 % IV SOLN
Freq: Once | INTRAVENOUS | Status: AC
  Administered 2012-03-09: 08:00:00 via INTRAVENOUS

## 2012-03-09 MED ORDER — SODIUM CHLORIDE 0.9 % IJ SOLN
10.0000 mL | INTRAMUSCULAR | Status: DC | PRN
  Administered 2012-03-09: 10 mL
  Filled 2012-03-09: qty 10

## 2012-03-09 NOTE — Patient Instructions (Signed)
[facility] Discharge Instructions for Patients Receiving Chemotherapy  Today you received the following chemotherapy agents Arsenic Trioxide  To help prevent nausea and vomiting after your treatment, we encourage you to take your nausea medication as prescribed.   If you develop nausea and vomiting that is not controlled by your nausea medication, call the clinic. If it is after clinic hours your family physician or the after hours number for the clinic or go to the Emergency Department.   BELOW ARE SYMPTOMS THAT SHOULD BE REPORTED IMMEDIATELY:  *FEVER GREATER THAN 100.5 F  *CHILLS WITH OR WITHOUT FEVER  NAUSEA AND VOMITING THAT IS NOT CONTROLLED WITH YOUR NAUSEA MEDICATION  *UNUSUAL SHORTNESS OF BREATH  *UNUSUAL BRUISING OR BLEEDING  TENDERNESS IN MOUTH AND THROAT WITH OR WITHOUT PRESENCE OF ULCERS  *URINARY PROBLEMS  *BOWEL PROBLEMS  UNUSUAL RASH Items with * indicate a potential emergency and should be followed up as soon as possible.  One of the nurses will contact you 24 hours after your treatment. Please let the nurse know about any problems that you may have experienced. Feel free to call the clinic you have any questions or concerns. The clinic phone number is [phone].   I have been informed and understand all the instructions given to me. I know to contact the clinic, my physician, or go to the Emergency Department if any problems should occur. I do not have any questions at this time, but understand that I may call the clinic during office hours   should I have any questions or need assistance in obtaining follow up care.    __________________________________________  _____________  __________ Signature of Patient or Authorized Representative            Date                   Time    __________________________________________ Nurse's Signature

## 2012-03-10 ENCOUNTER — Other Ambulatory Visit (HOSPITAL_BASED_OUTPATIENT_CLINIC_OR_DEPARTMENT_OTHER): Payer: Medicaid Other | Admitting: Lab

## 2012-03-10 ENCOUNTER — Ambulatory Visit (HOSPITAL_BASED_OUTPATIENT_CLINIC_OR_DEPARTMENT_OTHER): Payer: Medicaid Other

## 2012-03-10 ENCOUNTER — Telehealth: Payer: Self-pay | Admitting: Oncology

## 2012-03-10 VITALS — BP 133/82 | HR 74 | Temp 97.4°F

## 2012-03-10 DIAGNOSIS — Z5111 Encounter for antineoplastic chemotherapy: Secondary | ICD-10-CM

## 2012-03-10 DIAGNOSIS — C9200 Acute myeloblastic leukemia, not having achieved remission: Secondary | ICD-10-CM

## 2012-03-10 DIAGNOSIS — C9240 Acute promyelocytic leukemia, not having achieved remission: Secondary | ICD-10-CM

## 2012-03-10 LAB — COMPREHENSIVE METABOLIC PANEL
ALT: 18 U/L (ref 0–35)
AST: 13 U/L (ref 0–37)
Albumin: 4.1 g/dL (ref 3.5–5.2)
CO2: 24 mEq/L (ref 19–32)
Calcium: 9.8 mg/dL (ref 8.4–10.5)
Chloride: 96 mEq/L (ref 96–112)
Creatinine, Ser: 0.6 mg/dL (ref 0.50–1.10)
Potassium: 4.1 mEq/L (ref 3.5–5.3)
Sodium: 132 mEq/L — ABNORMAL LOW (ref 135–145)
Total Protein: 7.2 g/dL (ref 6.0–8.3)

## 2012-03-10 LAB — MAGNESIUM: Magnesium: 2.1 mg/dL (ref 1.5–2.5)

## 2012-03-10 LAB — CBC WITH DIFFERENTIAL/PLATELET
BASO%: 0 % (ref 0.0–2.0)
EOS%: 0 % (ref 0.0–7.0)
HCT: 31.1 % — ABNORMAL LOW (ref 34.8–46.6)
HGB: 10.7 g/dL — ABNORMAL LOW (ref 11.6–15.9)
MCHC: 34.4 g/dL (ref 31.5–36.0)
MONO#: 0.6 10*3/uL (ref 0.1–0.9)
NEUT%: 71.4 % (ref 38.4–76.8)
RDW: 16.8 % — ABNORMAL HIGH (ref 11.2–14.5)
WBC: 9.4 10*3/uL (ref 3.9–10.3)
lymph#: 2.1 10*3/uL (ref 0.9–3.3)

## 2012-03-10 MED ORDER — DEXAMETHASONE SODIUM PHOSPHATE 10 MG/ML IJ SOLN
10.0000 mg | Freq: Once | INTRAMUSCULAR | Status: AC
  Administered 2012-03-10: 10 mg via INTRAVENOUS

## 2012-03-10 MED ORDER — SODIUM CHLORIDE 0.9 % IV SOLN
0.1500 mg/kg/d | Freq: Every day | INTRAVENOUS | Status: DC
  Administered 2012-03-10: 12 mg via INTRAVENOUS
  Filled 2012-03-10: qty 12

## 2012-03-10 MED ORDER — HEPARIN SOD (PORK) LOCK FLUSH 100 UNIT/ML IV SOLN
500.0000 [IU] | Freq: Once | INTRAVENOUS | Status: AC | PRN
  Administered 2012-03-10: 500 [IU]
  Filled 2012-03-10: qty 5

## 2012-03-10 MED ORDER — SODIUM CHLORIDE 0.9 % IJ SOLN
10.0000 mL | INTRAMUSCULAR | Status: DC | PRN
  Administered 2012-03-10: 10 mL
  Filled 2012-03-10: qty 10

## 2012-03-10 MED ORDER — ONDANSETRON 8 MG/50ML IVPB (CHCC)
8.0000 mg | Freq: Once | INTRAVENOUS | Status: AC
  Administered 2012-03-10: 8 mg via INTRAVENOUS

## 2012-03-10 MED ORDER — SODIUM CHLORIDE 0.9 % IV SOLN: Freq: Once | INTRAVENOUS | Status: DC

## 2012-03-10 NOTE — Patient Instructions (Signed)
[facility] Discharge Instructions for Patients Receiving Chemotherapy  Today you received the following chemotherapy agents Arsenic To help prevent nausea and vomiting after your treatment, we encourage you to take your nausea medication as prescribed.   If you develop nausea and vomiting that is not controlled by your nausea medication, call the clinic. If it is after clinic hours your family physician or the after hours number for the clinic or go to the Emergency Department.   BELOW ARE SYMPTOMS THAT SHOULD BE REPORTED IMMEDIATELY:  *FEVER GREATER THAN 100.5 F  *CHILLS WITH OR WITHOUT FEVER  NAUSEA AND VOMITING THAT IS NOT CONTROLLED WITH YOUR NAUSEA MEDICATION  *UNUSUAL SHORTNESS OF BREATH  *UNUSUAL BRUISING OR BLEEDING  TENDERNESS IN MOUTH AND THROAT WITH OR WITHOUT PRESENCE OF ULCERS  *URINARY PROBLEMS  *BOWEL PROBLEMS  UNUSUAL RASH Items with * indicate a potential emergency and should be followed up as soon as possible.  One of the nurses will contact you 24 hours after your treatment. Please let the nurse know about any problems that you may have experienced. Feel free to call the clinic you have any questions or concerns. The clinic phone number is [phone].   I have been informed and understand all the instructions given to me. I know to contact the clinic, my physician, or go to the Emergency Department if any problems should occur. I do not have any questions at this time, but understand that I may call the clinic during office hours   should I have any questions or need assistance in obtaining follow up care.    __________________________________________  _____________  __________ Signature of Patient or Authorized Representative            Date                   Time    __________________________________________ Nurse's Signature

## 2012-03-10 NOTE — Telephone Encounter (Signed)
gve the pt her June 2013 appt calendar 

## 2012-03-11 ENCOUNTER — Encounter: Payer: Self-pay | Admitting: Family Medicine

## 2012-03-11 ENCOUNTER — Other Ambulatory Visit: Payer: Self-pay | Admitting: *Deleted

## 2012-03-11 ENCOUNTER — Encounter: Payer: Self-pay | Admitting: Oncology

## 2012-03-11 ENCOUNTER — Ambulatory Visit (HOSPITAL_BASED_OUTPATIENT_CLINIC_OR_DEPARTMENT_OTHER): Payer: Medicaid Other

## 2012-03-11 ENCOUNTER — Ambulatory Visit (INDEPENDENT_AMBULATORY_CARE_PROVIDER_SITE_OTHER): Payer: Medicaid Other | Admitting: Family Medicine

## 2012-03-11 VITALS — BP 125/85 | HR 79 | Temp 98.3°F

## 2012-03-11 VITALS — BP 140/103 | HR 91 | Temp 97.8°F | Wt 265.0 lb

## 2012-03-11 DIAGNOSIS — C9200 Acute myeloblastic leukemia, not having achieved remission: Secondary | ICD-10-CM

## 2012-03-11 DIAGNOSIS — C9240 Acute promyelocytic leukemia, not having achieved remission: Secondary | ICD-10-CM

## 2012-03-11 DIAGNOSIS — M549 Dorsalgia, unspecified: Secondary | ICD-10-CM

## 2012-03-11 DIAGNOSIS — Z5111 Encounter for antineoplastic chemotherapy: Secondary | ICD-10-CM

## 2012-03-11 MED ORDER — OXYCODONE HCL 5 MG PO TABS: 5.0000 mg | ORAL_TABLET | Freq: Four times a day (QID) | ORAL | Status: AC | PRN

## 2012-03-11 MED ORDER — CYCLOBENZAPRINE HCL 5 MG PO TABS: 5.0000 mg | ORAL_TABLET | Freq: Three times a day (TID) | ORAL | Status: DC | PRN

## 2012-03-11 MED ORDER — HEPARIN SOD (PORK) LOCK FLUSH 100 UNIT/ML IV SOLN
500.0000 [IU] | Freq: Once | INTRAVENOUS | Status: AC | PRN
  Administered 2012-03-11: 500 [IU]
  Filled 2012-03-11: qty 5

## 2012-03-11 MED ORDER — SODIUM CHLORIDE 0.9 % IV SOLN
Freq: Once | INTRAVENOUS | Status: AC
  Administered 2012-03-11: 11:00:00 via INTRAVENOUS

## 2012-03-11 MED ORDER — SODIUM CHLORIDE 0.9 % IV SOLN
0.1500 mg/kg/d | Freq: Every day | INTRAVENOUS | Status: DC
  Administered 2012-03-11: 12 mg via INTRAVENOUS
  Filled 2012-03-11: qty 12

## 2012-03-11 MED ORDER — DEXAMETHASONE SODIUM PHOSPHATE 10 MG/ML IJ SOLN
10.0000 mg | Freq: Once | INTRAMUSCULAR | Status: AC
  Administered 2012-03-11: 10 mg via INTRAVENOUS

## 2012-03-11 MED ORDER — MELOXICAM 7.5 MG PO TABS: 7.5000 mg | ORAL_TABLET | Freq: Every day | ORAL | Status: DC

## 2012-03-11 MED ORDER — HYDROCODONE-ACETAMINOPHEN 5-325 MG PO TABS: 1.0000 | ORAL_TABLET | Freq: Four times a day (QID) | ORAL | Status: AC | PRN

## 2012-03-11 MED ORDER — SODIUM CHLORIDE 0.9 % IJ SOLN
10.0000 mL | INTRAMUSCULAR | Status: DC | PRN
  Administered 2012-03-11: 10 mL
  Filled 2012-03-11: qty 10

## 2012-03-11 MED ORDER — ONDANSETRON 8 MG/50ML IVPB (CHCC)
8.0000 mg | Freq: Once | INTRAVENOUS | Status: AC
  Administered 2012-03-11: 8 mg via INTRAVENOUS

## 2012-03-11 NOTE — Assessment & Plan Note (Addendum)
Muscle spasm.  Treat with heat, ice, Flexeril. Patient refuses NSAIDs. Gave 10 oxycodone. This is short-term and not to be refilled.

## 2012-03-11 NOTE — Progress Notes (Signed)
Pt discharged to home.  No complaints.  

## 2012-03-11 NOTE — Patient Instructions (Signed)
I am going to give you a medicine for muscle spasm called Flexeril This may make you feel sleepy so please do not drive while taking it until you know how that affects you  I am also giving you Mobic which is a good anti-inflammatory medicine that you take once a day Please don't take Motrin, Aleve, Advil, ibuprofen while you're taking this medicine I am giving you 5 days of it but you can stop once the pain is better  Please use heat on this a few times a day Please keep moving and walking Your back will get stiffer if you don't keep moving

## 2012-03-11 NOTE — Progress Notes (Signed)
  Subjective:    Patient ID: Melissa Washington, female    DOB: [birth year], 26 y.o.   MRN: [id number]  HPI  Patient presents with one day of back pain after moving boxes of clothes yesterday. She said that she is having pain of her entire right side. She's been told in the past that she needs to take oxycodone for pain because of her other medicines. She does not have any weakness in her legs. She is not have any change in bowel or bladder. She denies any fevers or chills.  Patient has history of APML Review of Systems See above    Objective:   Physical Exam GEN-mildly uncomfortable appearing MSK-normal strength in lower extremities. Able to get up out of chair fairly easily. Tender to palpation from thoracic to lumbar area of the right side of her back. No limitation in range of motion.       Assessment & Plan:

## 2012-03-11 NOTE — Progress Notes (Signed)
Ms. Steinhart stated Terald Sleeper faxed a promise to pay letter to [employer] [facility] [employer].Per the  Investment banker, corporate they have not received a fax. Christine submitted the check request on 03/07/12. I called [employer] and talked with [place]. I informed [place] the check request date and I will have Erica to follow-up with her on Monday 03/14/12  regarding the fax.

## 2012-03-14 ENCOUNTER — Telehealth: Payer: Self-pay | Admitting: *Deleted

## 2012-03-14 ENCOUNTER — Ambulatory Visit (HOSPITAL_BASED_OUTPATIENT_CLINIC_OR_DEPARTMENT_OTHER): Payer: Medicaid Other | Admitting: Physician Assistant

## 2012-03-14 ENCOUNTER — Ambulatory Visit (HOSPITAL_BASED_OUTPATIENT_CLINIC_OR_DEPARTMENT_OTHER): Payer: Medicaid Other

## 2012-03-14 ENCOUNTER — Other Ambulatory Visit: Payer: Self-pay

## 2012-03-14 ENCOUNTER — Other Ambulatory Visit: Payer: Self-pay | Admitting: Oncology

## 2012-03-14 ENCOUNTER — Other Ambulatory Visit (HOSPITAL_BASED_OUTPATIENT_CLINIC_OR_DEPARTMENT_OTHER): Payer: Medicaid Other | Admitting: Lab

## 2012-03-14 VITALS — BP 138/83 | HR 92 | Temp 98.4°F | Ht 62.0 in | Wt 267.3 lb

## 2012-03-14 DIAGNOSIS — E119 Type 2 diabetes mellitus without complications: Secondary | ICD-10-CM

## 2012-03-14 DIAGNOSIS — C9200 Acute myeloblastic leukemia, not having achieved remission: Secondary | ICD-10-CM

## 2012-03-14 DIAGNOSIS — C9240 Acute promyelocytic leukemia, not having achieved remission: Secondary | ICD-10-CM

## 2012-03-14 DIAGNOSIS — Z5111 Encounter for antineoplastic chemotherapy: Secondary | ICD-10-CM

## 2012-03-14 LAB — CBC WITH DIFFERENTIAL/PLATELET
BASO%: 0.1 % (ref 0.0–2.0)
LYMPH%: 28.8 % (ref 14.0–49.7)
MCHC: 34.5 g/dL (ref 31.5–36.0)
MONO#: 0.5 10*3/uL (ref 0.1–0.9)
NEUT#: 6 10*3/uL (ref 1.5–6.5)
RBC: 3.67 10*6/uL — ABNORMAL LOW (ref 3.70–5.45)
RDW: 16.6 % — ABNORMAL HIGH (ref 11.2–14.5)
WBC: 9.3 10*3/uL (ref 3.9–10.3)
lymph#: 2.7 10*3/uL (ref 0.9–3.3)
nRBC: 1 % — ABNORMAL HIGH (ref 0–0)

## 2012-03-14 LAB — COMPREHENSIVE METABOLIC PANEL
ALT: 17 U/L (ref 0–35)
AST: 11 U/L (ref 0–37)
Calcium: 9.8 mg/dL (ref 8.4–10.5)
Chloride: 103 mEq/L (ref 96–112)
Creatinine, Ser: 0.51 mg/dL (ref 0.50–1.10)
Potassium: 3.9 mEq/L (ref 3.5–5.3)
Sodium: 135 mEq/L (ref 135–145)
Total Protein: 7 g/dL (ref 6.0–8.3)

## 2012-03-14 MED ORDER — ONDANSETRON HCL 8 MG PO TABS: ORAL_TABLET | ORAL | Status: DC

## 2012-03-14 MED ORDER — SODIUM CHLORIDE 0.9 % IJ SOLN
10.0000 mL | INTRAMUSCULAR | Status: DC | PRN
  Administered 2012-03-14: 10 mL
  Filled 2012-03-14: qty 10

## 2012-03-14 MED ORDER — PROCHLORPERAZINE MALEATE 10 MG PO TABS: 10.0000 mg | ORAL_TABLET | Freq: Four times a day (QID) | ORAL | Status: DC | PRN

## 2012-03-14 MED ORDER — DEXAMETHASONE SODIUM PHOSPHATE 10 MG/ML IJ SOLN
10.0000 mg | Freq: Once | INTRAMUSCULAR | Status: AC
  Administered 2012-03-14: 10 mg via INTRAVENOUS

## 2012-03-14 MED ORDER — ONDANSETRON 8 MG/50ML IVPB (CHCC)
8.0000 mg | Freq: Once | INTRAVENOUS | Status: AC
  Administered 2012-03-14: 8 mg via INTRAVENOUS

## 2012-03-14 MED ORDER — DEXAMETHASONE 4 MG PO TABS: ORAL_TABLET | ORAL | Status: DC

## 2012-03-14 MED ORDER — PROCHLORPERAZINE 25 MG RE SUPP: 25.0000 mg | Freq: Two times a day (BID) | RECTAL | Status: DC | PRN

## 2012-03-14 MED ORDER — HEPARIN SOD (PORK) LOCK FLUSH 100 UNIT/ML IV SOLN
500.0000 [IU] | Freq: Once | INTRAVENOUS | Status: AC | PRN
  Administered 2012-03-14: 500 [IU]
  Filled 2012-03-14: qty 5

## 2012-03-14 MED ORDER — SODIUM CHLORIDE 0.9 % IV SOLN
12.0000 mg | Freq: Every day | INTRAVENOUS | Status: DC
  Administered 2012-03-14: 12 mg via INTRAVENOUS
  Filled 2012-03-14: qty 12

## 2012-03-14 MED ORDER — SODIUM CHLORIDE 0.9 % IV SOLN
Freq: Once | INTRAVENOUS | Status: AC
  Administered 2012-03-14: 12:00:00 via INTRAVENOUS

## 2012-03-14 MED ORDER — LORAZEPAM 0.5 MG PO TABS: 0.5000 mg | ORAL_TABLET | Freq: Four times a day (QID) | ORAL | Status: AC | PRN

## 2012-03-14 NOTE — Telephone Encounter (Signed)
Per staff message I have scheduled apapts. JMW  

## 2012-03-14 NOTE — Progress Notes (Signed)
Hematology and Oncology Follow Up Visit  Melissa Washington [id number] April 30, 1986 25 y.o. 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  2. Type 2 diabetes mellitus.  3. Recent hospitalization for cholecystitis for which she subsequently underwent a laparoscopic cholecystectomy.  4. History of Klebsiella bacteremia.  Interim History:   Melissa Washington is seen today prior to initiating day 22, cycle 2 of arsenic trioxide based therapy given concomitantly with ATRA 50 mg by mouth twice a day for APL.  She voices no complaints today, no fevers, chills, or night sweats. No shortness of breath or chest pain. No bleeding or bruising symptoms. Energy level is actually quite good. She is not experiencing any unexplained weight loss, no drenching night sweats. She denies nausea, emesis, diarrhea, or constipation issues. A detailed review of systems is otherwise noncontributory as noted below.  Review of Systems: Constitutional:  no weight loss, fever, night sweats and feels well Eyes: no complaints ENT: no complaints Cardiovascular: no chest pain or dyspnea on exertion Respiratory: no cough, shortness of breath, or wheezing Neurological: no TIA or stroke symptoms Dermatological: negative Gastrointestinal: no abdominal pain, change in bowel habits, or black or bloody stools Genito-Urinary: no dysuria, trouble voiding, or hematuria Hematological and Lymphatic: negative Breast: negative Musculoskeletal: negative Remaining ROS negative.  Medications:   I have reviewed the patient's current medications.  Current Outpatient Prescriptions  Medication Sig Dispense Refill  . cyclobenzaprine (FLEXERIL) 5 MG tablet Take 1 tablet (5 mg total) by mouth 3 (three) times daily as needed for muscle spasms.   20 tablet  1  . dexamethasone (DECADRON) 4 MG tablet Take 2 tablets by mouth daily starting the day after chemotherapy for 2 days. Take with food.  30 tablet  1  . glucose blood (ACCU-CHEK ACTIVE STRIPS) test strip Use as instructed  100 each  12  . HYDROcodone-acetaminophen (NORCO) 5-325 MG per tablet Take 1 tablet by mouth every 6 (six) hours as needed for pain.  30 tablet  0  . insulin glargine (LANTUS) 100 UNIT/ML injection Inject 35 Units into the skin daily.  1 vial  12  . Insulin Syringe-Needle U-100 26G X 1/2" 1 ML MISC 35 Units by Does not apply route AC breakfast.  100 each  6  . lisinopril (PRINIVIL,ZESTRIL) 10 MG tablet Take 1 tablet (10 mg total) by mouth daily.  30 tablet  4  . LORazepam (ATIVAN) 0.5 MG tablet Take 1 tablet (0.5 mg total) by mouth every 6 (six) hours as needed (Nausea or vomiting).  30 tablet  0  . magnesium chloride (SLOW-MAG) 64 MG TBEC Take 2 tablets by mouth daily.      . metFORMIN (GLUCOPHAGE) 1000 MG tablet Take 1 tablet (1,000 mg total) by mouth daily with breakfast.  90 tablet  4  . MICONAZOLE NITRATE VAGINAL (MONISTAT 3) 4 % CREA Place 1 application vaginally as directed.  1 Tube  0  . ondansetron (ZOFRAN) 8 MG tablet Take 1 tablet two times a day starting the day after chemo for 2 days. Then take 1 tablet two times a day as needed for nausea or vomiting.  30 tablet  1  . oxyCODONE (ROXICODONE) 5 MG immediate release tablet Take 1 tablet (5 mg total) by mouth every 6 (six) hours as needed for pain.  10 tablet  0  . prochlorperazine (COMPAZINE) 10 MG tablet Take 1 tablet (10 mg total) by mouth every 6 (six) hours as needed (Nausea or vomiting).  30 tablet  1  . prochlorperazine (COMPAZINE) 25 MG suppository Place 1 suppository (25 mg total) rectally every 12 (twelve) hours as needed for nausea.  12 suppository  3  . promethazine (PHENERGAN) 12.5 MG tablet Take 12.5 mg by mouth every 8 (eight) hours as needed. For nausea      . tretinoin (VESANOID) 10 MG capsule  Take 50 mg by mouth 2 (two) times daily.      Marland Kitchen DISCONTD: glipiZIDE (GLUCOTROL) 5 MG tablet Take 1 tablet (5 mg total) by mouth 2 (two) times daily before a meal.  60 tablet  0   No current facility-administered medications for this visit.   Facility-Administered Medications Ordered in Other Visits  Medication Dose Route Frequency Provider Last Rate Last Dose  . 0.9 %  sodium chloride infusion   Intravenous Once Pierce Crane, MD 20 mL/hr at 03/14/12 1220    . arsenic trioxide (TRISENOX) 12 mg in sodium chloride 0.9 % 250 mL chemo Infusion  12 mg Intravenous Daily Pierce Crane, MD      . dexamethasone (DECADRON) injection 10 mg  10 mg Intravenous Once Pierce Crane, MD   10 mg at 03/14/12 1245  . heparin lock flush 100 unit/mL  500 Units Intracatheter Once PRN Pierce Crane, MD      . ondansetron [facility]) IVPB 8 mg  8 mg Intravenous Once Pierce Crane, MD   8 mg at 03/14/12 1245  . sodium chloride 0.9 % injection 10 mL  10 mL Intracatheter PRN Pierce Crane, MD        Allergies:  Allergies  Allergen Reactions  . Ultram (Tramadol Hcl) Nausea And Vomiting    Physical Exam: Filed Vitals:   03/14/12 0944  BP: 138/83  Pulse: 92  Temp: 98.4 F (36.9 C)    Body mass index is 48.89 kg/(m^2). Weight: 267 lbs. HEENT:  Sclerae anicteric, conjunctivae pink.  Oropharynx clear.  No mucositis or candidiasis.   Nodes:  No cervical, supraclavicular, or axillary lymphadenopathy palpated.   Lungs:  Clear to auscultation bilaterally.  No crackles, rhonchi, or wheezes.   Heart:  Regular rate and rhythm. Her left anterior chest wall PORT site has healed, no evidence of erythema. Abdomen:  Soft, nontender.  Positive bowel sounds.  No organomegaly or masses palpated.   Musculoskeletal:  No focal spinal tenderness to palpation.  Extremities:  Benign.  No peripheral edema or cyanosis.   Skin:  Benign.   Neuro:  Nonfocal, alert and oriented x 3   Lab Results: Lab Results  Component Value Date   WBC 9.3  03/14/2012   HGB 10.9* 03/14/2012   HCT 31.6* 03/14/2012   MCV 86.1 03/14/2012   PLT 280 Large platelets present 03/14/2012   NEUTROABS 6.0 03/14/2012     Chemistry      Component Value Date/Time   NA 135 03/14/2012 0930   K 3.9 03/14/2012 0930   CL 103 03/14/2012 0930   CO2 21 03/14/2012 0930   BUN 17 03/14/2012 0930   CREATININE 0.51 03/14/2012 0930   GLU 479* 03/01/2012 1210      Component Value Date/Time   CALCIUM 9.8 03/14/2012 0930   ALKPHOS 80 03/14/2012 0930   AST 11 03/14/2012 0930   ALT 17 03/14/2012 0930   BILITOT 0.6 03/14/2012 0930      No results  found for this basename: LABCA2     12 lead EKG: Obtained 03/14/2012--No evidence of QT interval elongation. (450 ms)  Assessment:  A 26 year old [place] woman with acute myelocytic leukemia due for day 22, cycle 2 of arsenic trioxide given on days 1-5, 8-12, 15-19, 22-26  on a q18month  regimen.  Oral ATRA per protocol 50mg  orally twice a day, completed on Day 15.  2. Diabetes mellitus type 2  3. Recent cholecystitis/laparoscopic cholecystectomy  4. History of Klebsiella bacteremia  5. History of hypomagnesemia, magnesium level today pending.  6. Reported vaginal candidosis, resolved.  This case reviewed with Dr. Pierce Crane.   Plan:  Patient will initiate day 22, cycle 2 of her arsenic-based regimen given with ATRA for APL, though ATRA has been completed for this cycle.  We will be rechecking a CBC, and STAT serum chemistry along with serum magnesium level on 03/17/12 prior to day 18, cycle 2.  She will be seen in one week's time prior to day 29, cycle 2, to include CBC, serum chemistry, magnesium level, and repeat 12 lead EKG.   This plan was reviewed with the patient, who voices understanding and agreement.  She knows to call with any changes or problems.   Jacie Tristan T, PA-C 03/14/12

## 2012-03-14 NOTE — Patient Instructions (Addendum)
[facility] Discharge Instructions for Patients Receiving Chemotherapy  Today you received the following chemotherapy agents --Arsenic.  To help prevent nausea and vomiting after your treatment, we encourage you to take your nausea medication   If you develop nausea and vomiting that is not controlled by your nausea medication, call the clinic. If it is after clinic hours your family physician or the after hours number for the clinic or go to the Emergency Department.   BELOW ARE SYMPTOMS THAT SHOULD BE REPORTED IMMEDIATELY:  *FEVER GREATER THAN 100.5 F  *CHILLS WITH OR WITHOUT FEVER  NAUSEA AND VOMITING THAT IS NOT CONTROLLED WITH YOUR NAUSEA MEDICATION  *UNUSUAL SHORTNESS OF BREATH  *UNUSUAL BRUISING OR BLEEDING  TENDERNESS IN MOUTH AND THROAT WITH OR WITHOUT PRESENCE OF ULCERS  *URINARY PROBLEMS  *BOWEL PROBLEMS  UNUSUAL RASH Items with * indicate a potential emergency and should be followed up as soon as possible.  One of the nurses will contact you 24 hours after your treatment. Please let the nurse know about any problems that you may have experienced. Feel free to call the clinic you have any questions or concerns. The clinic phone number is [phone].   I have been informed and understand all the instructions given to me. I know to contact the clinic, my physician, or go to the Emergency Department if any problems should occur. I do not have any questions at this time, but understand that I may call the clinic during office hours   should I have any questions or need assistance in obtaining follow up care.    __________________________________________  _____________  __________ Signature of Patient or Authorized Representative            Date                   Time    __________________________________________ Nurse's Signature    

## 2012-03-14 NOTE — Telephone Encounter (Signed)
made patient appointments for 03-21-2012 03-22-2012 03-23-2012 03-25-2012 sent email to michelle to set up patients treatments

## 2012-03-14 NOTE — Progress Notes (Signed)
Ok to treat with potassium of 3.9 per Dr Donnie Coffin.

## 2012-03-15 ENCOUNTER — Other Ambulatory Visit: Payer: Self-pay | Admitting: Oncology

## 2012-03-15 ENCOUNTER — Ambulatory Visit (HOSPITAL_BASED_OUTPATIENT_CLINIC_OR_DEPARTMENT_OTHER): Payer: Medicaid Other

## 2012-03-15 VITALS — BP 113/78 | HR 90 | Temp 97.7°F

## 2012-03-15 DIAGNOSIS — Z5111 Encounter for antineoplastic chemotherapy: Secondary | ICD-10-CM

## 2012-03-15 DIAGNOSIS — C9240 Acute promyelocytic leukemia, not having achieved remission: Secondary | ICD-10-CM

## 2012-03-15 DIAGNOSIS — C9200 Acute myeloblastic leukemia, not having achieved remission: Secondary | ICD-10-CM

## 2012-03-15 MED ORDER — SODIUM CHLORIDE 0.9 % IJ SOLN
10.0000 mL | INTRAMUSCULAR | Status: DC | PRN
  Administered 2012-03-15: 10 mL
  Filled 2012-03-15: qty 10

## 2012-03-15 MED ORDER — DEXAMETHASONE SODIUM PHOSPHATE 10 MG/ML IJ SOLN
10.0000 mg | Freq: Once | INTRAMUSCULAR | Status: AC
  Administered 2012-03-15: 10 mg via INTRAVENOUS

## 2012-03-15 MED ORDER — SODIUM CHLORIDE 0.9 % IV SOLN
0.1460 mg/kg/d | Freq: Every day | INTRAVENOUS | Status: DC
  Administered 2012-03-15: 12 mg via INTRAVENOUS
  Filled 2012-03-15: qty 12

## 2012-03-15 MED ORDER — SODIUM CHLORIDE 0.9 % IV SOLN
Freq: Once | INTRAVENOUS | Status: AC
  Administered 2012-03-15: 14:00:00 via INTRAVENOUS

## 2012-03-15 MED ORDER — ONDANSETRON 8 MG/50ML IVPB (CHCC)
8.0000 mg | Freq: Once | INTRAVENOUS | Status: AC
  Administered 2012-03-15: 8 mg via INTRAVENOUS

## 2012-03-15 MED ORDER — HEPARIN SOD (PORK) LOCK FLUSH 100 UNIT/ML IV SOLN
500.0000 [IU] | Freq: Once | INTRAVENOUS | Status: AC | PRN
  Administered 2012-03-15: 500 [IU]
  Filled 2012-03-15: qty 5

## 2012-03-15 NOTE — Patient Instructions (Addendum)
[facility] Discharge Instructions for Patients Receiving Chemotherapy  Today you received the following chemotherapy agent Trisenox.  To help prevent nausea and vomiting after your treatment, we encourage you to take your nausea medication. Begin taking it as often as prescribed for by Dr. Donnie Coffin.    If you develop nausea and vomiting that is not controlled by your nausea medication, call the clinic. If it is after clinic hours your family physician or the after hours number for the clinic or go to the Emergency Department.   BELOW ARE SYMPTOMS THAT SHOULD BE REPORTED IMMEDIATELY:  *FEVER GREATER THAN 100.5 F  *CHILLS WITH OR WITHOUT FEVER  NAUSEA AND VOMITING THAT IS NOT CONTROLLED WITH YOUR NAUSEA MEDICATION  *UNUSUAL SHORTNESS OF BREATH  *UNUSUAL BRUISING OR BLEEDING  TENDERNESS IN MOUTH AND THROAT WITH OR WITHOUT PRESENCE OF ULCERS  *URINARY PROBLEMS  *BOWEL PROBLEMS  UNUSUAL RASH Items with * indicate a potential emergency and should be followed up as soon as possible.  One of the nurses will contact you 24 hours after your treatment. Please let the nurse know about any problems that you may have experienced. Feel free to call the clinic you have any questions or concerns. The clinic phone number is [phone].   I have been informed and understand all the instructions given to me. I know to contact the clinic, my physician, or go to the Emergency Department if any problems should occur. I do not have any questions at this time, but understand that I may call the clinic during office hours   should I have any questions or need assistance in obtaining follow up care.    __________________________________________  _____________  __________ Signature of Patient or Authorized Representative            Date                   Time    __________________________________________ Nurse's Signature

## 2012-03-16 ENCOUNTER — Ambulatory Visit (HOSPITAL_BASED_OUTPATIENT_CLINIC_OR_DEPARTMENT_OTHER): Payer: Medicaid Other

## 2012-03-16 VITALS — BP 125/87 | HR 69 | Temp 98.3°F

## 2012-03-16 DIAGNOSIS — C9200 Acute myeloblastic leukemia, not having achieved remission: Secondary | ICD-10-CM

## 2012-03-16 DIAGNOSIS — Z5111 Encounter for antineoplastic chemotherapy: Secondary | ICD-10-CM

## 2012-03-16 DIAGNOSIS — C9240 Acute promyelocytic leukemia, not having achieved remission: Secondary | ICD-10-CM

## 2012-03-16 MED ORDER — SODIUM CHLORIDE 0.9 % IV SOLN
Freq: Once | INTRAVENOUS | Status: AC
  Administered 2012-03-16: 08:00:00 via INTRAVENOUS

## 2012-03-16 MED ORDER — DEXAMETHASONE SODIUM PHOSPHATE 10 MG/ML IJ SOLN
10.0000 mg | Freq: Once | INTRAMUSCULAR | Status: AC
  Administered 2012-03-16: 10 mg via INTRAVENOUS

## 2012-03-16 MED ORDER — SODIUM CHLORIDE 0.9 % IV SOLN
0.1460 mg/kg/d | Freq: Every day | INTRAVENOUS | Status: DC
  Administered 2012-03-16: 12 mg via INTRAVENOUS
  Filled 2012-03-16: qty 12

## 2012-03-16 MED ORDER — HEPARIN SOD (PORK) LOCK FLUSH 100 UNIT/ML IV SOLN
500.0000 [IU] | Freq: Once | INTRAVENOUS | Status: AC | PRN
  Administered 2012-03-16: 500 [IU]
  Filled 2012-03-16: qty 5

## 2012-03-16 MED ORDER — ONDANSETRON 8 MG/50ML IVPB (CHCC)
8.0000 mg | Freq: Once | INTRAVENOUS | Status: AC
  Administered 2012-03-16: 8 mg via INTRAVENOUS

## 2012-03-16 MED ORDER — SODIUM CHLORIDE 0.9 % IJ SOLN
10.0000 mL | INTRAMUSCULAR | Status: DC | PRN
  Administered 2012-03-16: 10 mL
  Filled 2012-03-16: qty 10

## 2012-03-17 ENCOUNTER — Other Ambulatory Visit: Payer: Self-pay | Admitting: Oncology

## 2012-03-17 ENCOUNTER — Other Ambulatory Visit: Payer: Medicaid Other | Admitting: Lab

## 2012-03-17 ENCOUNTER — Ambulatory Visit (HOSPITAL_BASED_OUTPATIENT_CLINIC_OR_DEPARTMENT_OTHER): Payer: Medicaid Other

## 2012-03-17 VITALS — BP 141/89 | HR 91 | Temp 98.4°F

## 2012-03-17 DIAGNOSIS — C9200 Acute myeloblastic leukemia, not having achieved remission: Secondary | ICD-10-CM

## 2012-03-17 DIAGNOSIS — Z5111 Encounter for antineoplastic chemotherapy: Secondary | ICD-10-CM

## 2012-03-17 DIAGNOSIS — C9240 Acute promyelocytic leukemia, not having achieved remission: Secondary | ICD-10-CM

## 2012-03-17 DIAGNOSIS — M549 Dorsalgia, unspecified: Secondary | ICD-10-CM

## 2012-03-17 LAB — CBC WITH DIFFERENTIAL/PLATELET
Basophils Absolute: 0 10*3/uL (ref 0.0–0.1)
EOS%: 0.1 % (ref 0.0–7.0)
HGB: 11.7 g/dL (ref 11.6–15.9)
LYMPH%: 28.4 % (ref 14.0–49.7)
MCH: 29.9 pg (ref 25.1–34.0)
MCV: 85.7 fL (ref 79.5–101.0)
MONO%: 8.4 % (ref 0.0–14.0)
RDW: 17 % — ABNORMAL HIGH (ref 11.2–14.5)

## 2012-03-17 LAB — COMPREHENSIVE METABOLIC PANEL
Alkaline Phosphatase: 79 U/L (ref 39–117)
BUN: 26 mg/dL — ABNORMAL HIGH (ref 6–23)
Creatinine, Ser: 0.65 mg/dL (ref 0.50–1.10)
Glucose, Bld: 337 mg/dL — ABNORMAL HIGH (ref 70–99)
Sodium: 133 mEq/L — ABNORMAL LOW (ref 135–145)
Total Bilirubin: 0.8 mg/dL (ref 0.3–1.2)

## 2012-03-17 LAB — TECHNOLOGIST REVIEW

## 2012-03-17 MED ORDER — SODIUM CHLORIDE 0.9 % IV SOLN
Freq: Once | INTRAVENOUS | Status: AC
  Administered 2012-03-17: 09:00:00 via INTRAVENOUS

## 2012-03-17 MED ORDER — HEPARIN SOD (PORK) LOCK FLUSH 100 UNIT/ML IV SOLN
500.0000 [IU] | Freq: Once | INTRAVENOUS | Status: AC | PRN
  Administered 2012-03-17: 500 [IU]
  Filled 2012-03-17: qty 5

## 2012-03-17 MED ORDER — POTASSIUM CHLORIDE CRYS ER 20 MEQ PO TBCR
20.0000 meq | EXTENDED_RELEASE_TABLET | Freq: Once | ORAL | Status: AC
  Administered 2012-03-17: 20 meq via ORAL
  Filled 2012-03-17: qty 1

## 2012-03-17 MED ORDER — CYCLOBENZAPRINE HCL 5 MG PO TABS: 5.0000 mg | ORAL_TABLET | Freq: Three times a day (TID) | ORAL | Status: AC | PRN

## 2012-03-17 MED ORDER — ONDANSETRON 8 MG/50ML IVPB (CHCC)
8.0000 mg | Freq: Once | INTRAVENOUS | Status: AC
  Administered 2012-03-17: 8 mg via INTRAVENOUS

## 2012-03-17 MED ORDER — POTASSIUM CHLORIDE CRYS ER 20 MEQ PO TBCR: 20.0000 meq | EXTENDED_RELEASE_TABLET | Freq: Two times a day (BID) | ORAL | Status: DC

## 2012-03-17 MED ORDER — DEXAMETHASONE SODIUM PHOSPHATE 10 MG/ML IJ SOLN
10.0000 mg | Freq: Once | INTRAMUSCULAR | Status: AC
  Administered 2012-03-17: 10 mg via INTRAVENOUS

## 2012-03-17 MED ORDER — SODIUM CHLORIDE 0.9 % IV SOLN
0.1460 mg/kg/d | Freq: Every day | INTRAVENOUS | Status: DC
  Administered 2012-03-17: 12 mg via INTRAVENOUS
  Filled 2012-03-17: qty 12

## 2012-03-17 MED ORDER — SODIUM CHLORIDE 0.9 % IJ SOLN
10.0000 mL | INTRAMUSCULAR | Status: DC | PRN
  Administered 2012-03-17: 10 mL
  Filled 2012-03-17: qty 10

## 2012-03-17 NOTE — Patient Instructions (Addendum)
 [facility] Discharge Instructions for Patients Receiving Chemotherapy  Today you received the following chemotherapy agents Arsenic  To help prevent nausea and vomiting after your treatment, we encourage you to take your nausea medication as directed by your MD. If you develop nausea and vomiting that is not controlled by your nausea medication, call the clinic. If it is after clinic hours your family physician or the after hours number for the clinic or go to the Emergency Department.   BELOW ARE SYMPTOMS THAT SHOULD BE REPORTED IMMEDIATELY:  *FEVER GREATER THAN 100.5 F  *CHILLS WITH OR WITHOUT FEVER  NAUSEA AND VOMITING THAT IS NOT CONTROLLED WITH YOUR NAUSEA MEDICATION  *UNUSUAL SHORTNESS OF BREATH  *UNUSUAL BRUISING OR BLEEDING  TENDERNESS IN MOUTH AND THROAT WITH OR WITHOUT PRESENCE OF ULCERS  *URINARY PROBLEMS  *BOWEL PROBLEMS  UNUSUAL RASH Items with * indicate a potential emergency and should be followed up as soon as possible.   Feel free to call the clinic you have any questions or concerns. The clinic phone number is [phone].

## 2012-03-18 ENCOUNTER — Ambulatory Visit: Payer: Medicaid Other | Admitting: Nutrition

## 2012-03-18 ENCOUNTER — Ambulatory Visit (HOSPITAL_BASED_OUTPATIENT_CLINIC_OR_DEPARTMENT_OTHER): Payer: Medicaid Other

## 2012-03-18 VITALS — BP 118/83 | HR 91 | Temp 91.0°F

## 2012-03-18 DIAGNOSIS — C9240 Acute promyelocytic leukemia, not having achieved remission: Secondary | ICD-10-CM

## 2012-03-18 DIAGNOSIS — Z5111 Encounter for antineoplastic chemotherapy: Secondary | ICD-10-CM

## 2012-03-18 DIAGNOSIS — C9200 Acute myeloblastic leukemia, not having achieved remission: Secondary | ICD-10-CM

## 2012-03-18 MED ORDER — SODIUM CHLORIDE 0.9 % IV SOLN
0.1460 mg/kg/d | Freq: Every day | INTRAVENOUS | Status: DC
  Administered 2012-03-18: 12 mg via INTRAVENOUS
  Filled 2012-03-18: qty 12

## 2012-03-18 MED ORDER — DEXAMETHASONE SODIUM PHOSPHATE 10 MG/ML IJ SOLN
10.0000 mg | Freq: Once | INTRAMUSCULAR | Status: AC
  Administered 2012-03-18: 10 mg via INTRAVENOUS

## 2012-03-18 MED ORDER — HEPARIN SOD (PORK) LOCK FLUSH 100 UNIT/ML IV SOLN
500.0000 [IU] | Freq: Once | INTRAVENOUS | Status: AC | PRN
  Administered 2012-03-18: 500 [IU]
  Filled 2012-03-18: qty 5

## 2012-03-18 MED ORDER — ONDANSETRON 8 MG/50ML IVPB (CHCC)
8.0000 mg | Freq: Once | INTRAVENOUS | Status: AC
  Administered 2012-03-18: 8 mg via INTRAVENOUS

## 2012-03-18 MED ORDER — SODIUM CHLORIDE 0.9 % IJ SOLN
10.0000 mL | INTRAMUSCULAR | Status: DC | PRN
  Administered 2012-03-18: 10 mL
  Filled 2012-03-18: qty 10

## 2012-03-18 MED ORDER — SODIUM CHLORIDE 0.9 % IV SOLN
Freq: Once | INTRAVENOUS | Status: AC
  Administered 2012-03-18: 09:00:00 via INTRAVENOUS

## 2012-03-18 NOTE — Assessment & Plan Note (Signed)
Melissa Washington is a 26 year old patient of Dr. Renelda Loma, diagnosed with AML.  HISTORY:  Includes diabetes, hypertension, nephrolithiasis, and cholelithiasis.  MEDICATIONS INCLUDE:  Lantus, Glucophage, magnesium chloride, and Phenergan.  LABORATORIES:  Labs include sodium 133, glucose 337, BUN 26, creatinine 0.65 March 17, 2012.  HEIGHT:  62 inches. WEIGHT:  267.3 pounds documented 06/24. USUAL BODY WEIGHT:  275 pounds per patient. BMI:  48.88.  The patient reports that she is trying to eat a healthier diet.  She states that she eats a couple of meal a day, but does snack frequently throughout the day.  She tries to eat more vegetables and has tried to incorporate more baked or grilled foods and eliminate fried foods.  She still occasionally drinks regular soft drinks.  She reports blood sugars in the range of 250-300 on fasting levels.  She states this is improved from over 500.  She reports that she was educated on diabetic diet in the past.  However, she is unable to recall specifics for how she should be eating at this time.  NUTRITION DIAGNOSIS:  Food and nutrition-related knowledge deficit related to diagnosis of acute myelogenous leukemia with history of diabetes as evidenced by patient's verbalization of incomplete and inaccurate information.  INTERVENTION:  I have educated the patient on the importance of a healthy plant-based diet with lean protein sources in small amounts throughout the day.  I specifically reviewed diabetic education with her and encouraged her to control and monitor her carbohydrate intake at meals and snacks.  I have discussed portion sizes and converting from low-fiber carbohydrates to higher-fiber carbohydrates.  I have provided her with fact sheets and my contact information and I have answered her questions.  MONITORING/EVALUATION (GOALS):  That patient will tolerate a healthier plant-based diet with adequate protein and complex carbohydrates to provide slow safe  weight loss and improve glycemic control.  NEXT VISIT:  There is no followup scheduled.  The patient has my contact information for questions or concerns.    ______________________________ Zenovia Jarred, RD, LDN Clinical Nutrition Specialist BN/MEDQ  D:  03/18/2012  T:  03/18/2012  Job:  [id number]

## 2012-03-18 NOTE — Patient Instructions (Addendum)
[facility] Discharge Instructions for Patients Receiving Chemotherapy  Today you received the following chemotherapy agents Arsenic Trioxide  To help prevent nausea and vomiting after your treatment, we encourage you to take your nausea medication Begin taking it at 7 pm and take it as often as prescribed for the next 24 to 72 hours.   If you develop nausea and vomiting that is not controlled by your nausea medication, call the clinic. If it is after clinic hours your family physician or the after hours number for the clinic or go to the Emergency Department.   BELOW ARE SYMPTOMS THAT SHOULD BE REPORTED IMMEDIATELY:  *FEVER GREATER THAN 100.5 F  *CHILLS WITH OR WITHOUT FEVER  NAUSEA AND VOMITING THAT IS NOT CONTROLLED WITH YOUR NAUSEA MEDICATION  *UNUSUAL SHORTNESS OF BREATH  *UNUSUAL BRUISING OR BLEEDING  TENDERNESS IN MOUTH AND THROAT WITH OR WITHOUT PRESENCE OF ULCERS  *URINARY PROBLEMS  *BOWEL PROBLEMS  UNUSUAL RASH Items with * indicate a potential emergency and should be followed up as soon as possible.  One of the nurses will contact you 24 hours after your treatment. Please let the nurse know about any problems that you may have experienced. Feel free to call the clinic you have any questions or concerns. The clinic phone number is [phone].   I have been informed and understand all the instructions given to me. I know to contact the clinic, my physician, or go to the Emergency Department if any problems should occur. I do not have any questions at this time, but understand that I may call the clinic during office hours   should I have any questions or need assistance in obtaining follow up care.    __________________________________________  _____________  __________ Signature of Patient or Authorized Representative            Date                   Time    __________________________________________ Nurse's Signature

## 2012-03-21 ENCOUNTER — Other Ambulatory Visit: Payer: Medicaid Other | Admitting: Lab

## 2012-03-21 ENCOUNTER — Ambulatory Visit (HOSPITAL_BASED_OUTPATIENT_CLINIC_OR_DEPARTMENT_OTHER): Payer: Medicaid Other | Admitting: Physician Assistant

## 2012-03-21 ENCOUNTER — Other Ambulatory Visit: Payer: Self-pay

## 2012-03-21 ENCOUNTER — Ambulatory Visit: Payer: Medicaid Other

## 2012-03-21 VITALS — BP 131/85 | HR 105 | Temp 98.2°F | Ht 62.0 in | Wt 264.4 lb

## 2012-03-21 DIAGNOSIS — E119 Type 2 diabetes mellitus without complications: Secondary | ICD-10-CM

## 2012-03-21 DIAGNOSIS — C9200 Acute myeloblastic leukemia, not having achieved remission: Secondary | ICD-10-CM

## 2012-03-21 DIAGNOSIS — C9240 Acute promyelocytic leukemia, not having achieved remission: Secondary | ICD-10-CM

## 2012-03-21 LAB — CBC WITH DIFFERENTIAL/PLATELET
BASO%: 0.3 % (ref 0.0–2.0)
EOS%: 1 % (ref 0.0–7.0)
HCT: 33.5 % — ABNORMAL LOW (ref 34.8–46.6)
LYMPH%: 22.1 % (ref 14.0–49.7)
MCH: 30.6 pg (ref 25.1–34.0)
MCHC: 33.6 g/dL (ref 31.5–36.0)
MONO%: 4.8 % (ref 0.0–14.0)
NEUT%: 71.8 % (ref 38.4–76.8)
Platelets: 240 10*3/uL (ref 145–400)

## 2012-03-21 LAB — COMPREHENSIVE METABOLIC PANEL
ALT: 16 U/L (ref 0–35)
AST: 12 U/L (ref 0–37)
Creatinine, Ser: 0.52 mg/dL (ref 0.50–1.10)
Total Bilirubin: 0.8 mg/dL (ref 0.3–1.2)

## 2012-03-21 NOTE — Progress Notes (Signed)
Hematology and Oncology Follow Up Visit  [name] [id number] 1985/10/29 26 y.o. 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  2. Type 2 diabetes mellitus.  3. Recent hospitalization for cholecystitis for which she subsequently underwent a laparoscopic cholecystectomy.  4. History of Klebsiella bacteremia.  Interim History:   [name] is seen today, currently day 29, cycle 2 of arsenic trioxide based therapy given concomitantly with ATRA 50 mg by mouth twice a day for APL.  She voices no complaints today, no fevers, chills, or night sweats. No shortness of breath or chest pain. No bleeding or bruising symptoms. Energy level is actually quite good. She is not experiencing any unexplained weight loss, no drenching night sweats. She denies nausea, emesis, diarrhea, or constipation issues. A detailed review of systems is otherwise noncontributory as noted below.  Review of Systems: Constitutional:  no weight loss, fever, night sweats and feels well Eyes: no complaints ENT: no complaints Cardiovascular: no chest pain or dyspnea on exertion Respiratory: no cough, shortness of breath, or wheezing Neurological: no TIA or stroke symptoms Dermatological: negative Gastrointestinal: no abdominal pain, change in bowel habits, or black or bloody stools Genito-Urinary: no dysuria, trouble voiding, or hematuria Hematological and Lymphatic: negative Breast: negative Musculoskeletal: negative Remaining ROS negative.  Medications:   I have reviewed the patient's current medications.  Current Outpatient Prescriptions  Medication Sig Dispense Refill  . cyclobenzaprine (FLEXERIL) 5 MG tablet Take 1 tablet (5 mg total) by mouth 3 (three) times daily as needed for muscle spasms.  20  tablet  1  . dexamethasone (DECADRON) 4 MG tablet Take 2 tablets by mouth daily starting the day after chemotherapy for 2 days. Take with food.  30 tablet  1  . glucose blood (ACCU-CHEK ACTIVE STRIPS) test strip Use as instructed  100 each  12  . HYDROcodone-acetaminophen (NORCO) 5-325 MG per tablet Take 1 tablet by mouth every 6 (six) hours as needed for pain.  30 tablet  0  . insulin glargine (LANTUS) 100 UNIT/ML injection Inject 35 Units into the skin daily.  1 vial  12  . Insulin Syringe-Needle U-100 26G X 1/2" 1 ML MISC 35 Units by Does not apply route AC breakfast.  100 each  6  . lisinopril (PRINIVIL,ZESTRIL) 10 MG tablet Take 1 tablet (10 mg total) by mouth daily.  30 tablet  4  . LORazepam (ATIVAN) 0.5 MG tablet Take 1 tablet (0.5 mg total) by mouth every 6 (six) hours as needed (Nausea or vomiting).  30 tablet  0  . magnesium chloride (SLOW-MAG) 64 MG TBEC Take 2 tablets by mouth daily.      . metFORMIN (GLUCOPHAGE) 1000 MG tablet Take 1 tablet (1,000 mg total) by mouth daily with breakfast.  90 tablet  4  . MICONAZOLE NITRATE VAGINAL (MONISTAT 3) 4 % CREA Place 1 application vaginally as directed.  1 Tube  0  . ondansetron (ZOFRAN) 8 MG tablet Take 1 tablet two times a day starting the day after chemo for 2 days. Then take 1 tablet two times a day as needed for nausea or vomiting.  30 tablet  1  . oxyCODONE (ROXICODONE) 5 MG immediate release tablet Take 1 tablet (5 mg total) by mouth every 6 (six) hours as needed for pain.  10 tablet  0  . prochlorperazine (COMPAZINE) 10 MG tablet Take 1 tablet (10 mg total) by mouth every 6 (six) hours as needed (Nausea or vomiting).  30 tablet  1  . prochlorperazine (COMPAZINE) 25 MG suppository Place 1 suppository (25 mg total) rectally every 12 (twelve) hours as needed for nausea.  12 suppository  3  . promethazine (PHENERGAN) 12.5 MG tablet Take 12.5 mg by mouth every 8 (eight) hours as needed. For nausea      . tretinoin (VESANOID) 10 MG capsule  Take 50 mg by mouth 2 (two) times daily.      Marland Kitchen DISCONTD: glipiZIDE (GLUCOTROL) 5 MG tablet Take 1 tablet (5 mg total) by mouth 2 (two) times daily before a meal.  60 tablet  0   No current facility-administered medications for this visit.   Facility-Administered Medications Ordered in Other Visits  Medication Dose Route Frequency Provider Last Rate Last Dose  . arsenic trioxide (TRISENOX) 12 mg in sodium chloride 0.9 % 250 mL chemo Infusion  0.146 mg/kg/day (Order-Specific) Intravenous Daily Pierce Crane, MD   12 mg at 03/15/12 1447  . sodium chloride 0.9 % injection 10 mL  10 mL Intracatheter PRN Pierce Crane, MD   10 mL at 03/15/12 1706    Allergies:  Allergies  Allergen Reactions  . Ultram (Tramadol Hcl) Nausea And Vomiting    Physical Exam: Filed Vitals:   03/21/12 1402  BP: 131/85  Pulse: 105  Temp: 98.2 F (36.8 C)    Body mass index is 48.36 kg/(m^2). Weight: 264 lbs. HEENT:  Sclerae anicteric, conjunctivae pink.  Oropharynx clear.  No mucositis or candidiasis.   Nodes:  No cervical, supraclavicular, or axillary lymphadenopathy palpated.   Lungs:  Clear to auscultation bilaterally.  No crackles, rhonchi, or wheezes.   Heart:  Regular rate and rhythm. Her left anterior chest wall PORT site has healed, no evidence of erythema. Abdomen:  Soft, nontender.  Positive bowel sounds.  No organomegaly or masses palpated.   Musculoskeletal:  No focal spinal tenderness to palpation.  Extremities:  Benign.  No peripheral edema or cyanosis.   Skin:  Benign.   Neuro:  Nonfocal, alert and oriented x 3   Lab Results: Lab Results  Component Value Date   WBC 7.9 03/21/2012   HGB 11.2* 03/21/2012   HCT 33.5* 03/21/2012   MCV 91.0 03/21/2012   PLT 240 03/21/2012   NEUTROABS 5.7 03/21/2012     Chemistry      Component Value Date/Time   NA 136 03/21/2012 1330   K 3.8 03/21/2012 1330   CL 102 03/21/2012 1330   CO2 21 03/21/2012 1330   BUN 14 03/21/2012 1330   CREATININE 0.52 03/21/2012 1330   GLU 479*  03/01/2012 1210      Component Value Date/Time   CALCIUM 9.3 03/21/2012 1330   ALKPHOS 75 03/21/2012 1330   AST 12 03/21/2012 1330   ALT 16 03/21/2012 1330   BILITOT 0.8 03/21/2012 1330      No results found for this basename: LABCA2     12 lead EKG: Obtained 03/14/2012--Evidence of mild QT interval elongation. (461 ms)  Assessment:  A 27 year old [place] woman with acute myelocytic leukemia currently day 29, cycle 2 of arsenic trioxide given on days 1-5, 8-12, 15-19, 22-26  on a q78month  regimen.  Oral ATRA per protocol 50mg  orally twice a day, completed on Day 15  2. Diabetes mellitus type 2  3. Recent cholecystitis/laparoscopic cholecystectomy  4. History of Klebsiella  bacteremia  5. History of hypomagnesemia, magnesium level normal today.  6. Reported vaginal candidosis, resolved.  This case reviewed with Dr. Pierce Crane.   Plan:  Patient will be seen in one week's time to include CBC, serum chemistry, and magnesium level. Pertaining to her ATRA, she will resume 50mg  po BID on 03/28/12. This plan was reviewed with the patient, who voices understanding and agreement.  She knows to call with any changes or problems.   Chandell Attridge T, PA-C 03/21/12

## 2012-03-21 NOTE — Progress Notes (Signed)
Per Debbora [name], PA, pt does not need treatment today, has completed cycle.

## 2012-03-22 ENCOUNTER — Ambulatory Visit: Payer: Medicaid Other

## 2012-03-22 ENCOUNTER — Other Ambulatory Visit: Payer: Medicaid Other | Admitting: Lab

## 2012-03-22 ENCOUNTER — Ambulatory Visit: Payer: Medicaid Other | Admitting: Physician Assistant

## 2012-03-23 ENCOUNTER — Other Ambulatory Visit: Payer: Medicaid Other | Admitting: Lab

## 2012-03-23 ENCOUNTER — Encounter: Payer: Self-pay | Admitting: Oncology

## 2012-03-23 ENCOUNTER — Ambulatory Visit: Payer: Medicaid Other

## 2012-03-25 ENCOUNTER — Other Ambulatory Visit: Payer: Medicaid Other

## 2012-03-25 ENCOUNTER — Ambulatory Visit: Payer: Medicaid Other | Admitting: Physician Assistant

## 2012-03-25 ENCOUNTER — Ambulatory Visit: Payer: Medicaid Other

## 2012-03-29 ENCOUNTER — Telehealth: Payer: Self-pay | Admitting: Oncology

## 2012-03-29 NOTE — Telephone Encounter (Signed)
gve the pt her July 2013 appt calendar °

## 2012-03-30 ENCOUNTER — Encounter: Payer: Self-pay | Admitting: Physician Assistant

## 2012-03-30 ENCOUNTER — Ambulatory Visit (HOSPITAL_BASED_OUTPATIENT_CLINIC_OR_DEPARTMENT_OTHER): Payer: Medicaid Other | Admitting: Physician Assistant

## 2012-03-30 ENCOUNTER — Encounter: Payer: Self-pay | Admitting: *Deleted

## 2012-03-30 ENCOUNTER — Other Ambulatory Visit: Payer: Self-pay

## 2012-03-30 ENCOUNTER — Telehealth: Payer: Self-pay | Admitting: *Deleted

## 2012-03-30 ENCOUNTER — Other Ambulatory Visit (HOSPITAL_BASED_OUTPATIENT_CLINIC_OR_DEPARTMENT_OTHER): Payer: Medicaid Other | Admitting: Lab

## 2012-03-30 VITALS — BP 112/76 | HR 94 | Temp 98.5°F | Ht 62.0 in | Wt 264.5 lb

## 2012-03-30 DIAGNOSIS — C9200 Acute myeloblastic leukemia, not having achieved remission: Secondary | ICD-10-CM

## 2012-03-30 DIAGNOSIS — C9240 Acute promyelocytic leukemia, not having achieved remission: Secondary | ICD-10-CM

## 2012-03-30 LAB — COMPREHENSIVE METABOLIC PANEL
AST: 17 U/L (ref 0–37)
Albumin: 4 g/dL (ref 3.5–5.2)
BUN: 10 mg/dL (ref 6–23)
CO2: 20 mEq/L (ref 19–32)
Calcium: 9.8 mg/dL (ref 8.4–10.5)
Chloride: 103 mEq/L (ref 96–112)
Glucose, Bld: 258 mg/dL — ABNORMAL HIGH (ref 70–99)
Potassium: 3.7 mEq/L (ref 3.5–5.3)

## 2012-03-30 LAB — MAGNESIUM: Magnesium: 1.8 mg/dL (ref 1.5–2.5)

## 2012-03-30 LAB — CBC WITH DIFFERENTIAL/PLATELET
Basophils Absolute: 0 10*3/uL (ref 0.0–0.1)
EOS%: 0.6 % (ref 0.0–7.0)
Eosinophils Absolute: 0 10*3/uL (ref 0.0–0.5)
HGB: 11.1 g/dL — ABNORMAL LOW (ref 11.6–15.9)
MONO#: 0.6 10*3/uL (ref 0.1–0.9)
NEUT#: 3.8 10*3/uL (ref 1.5–6.5)
RDW: 18.2 % — ABNORMAL HIGH (ref 11.2–14.5)
lymph#: 1.3 10*3/uL (ref 0.9–3.3)

## 2012-03-30 NOTE — Telephone Encounter (Signed)
Gave patient appointment for 04-05-2012 thru 05-10-2012 emailed michelle to set up patient's treatment made patient appointment with [facility] on 04-14-2012 at 4:00pm printed out calendar and gave to the patient

## 2012-03-30 NOTE — Progress Notes (Signed)
Hematology and Oncology Follow Up Visit  Melissa Washington [id number] 10-16-1985 26 y.o. 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  2. Type 2 diabetes mellitus.  3. Recent hospitalization for cholecystitis for which she subsequently underwent a laparoscopic cholecystectomy.  4. History of Klebsiella bacteremia.  Interim History:   Melissa Washington is seen today, currently day 30, cycle 2 of arsenic trioxide based therapy given concomitantly with ATRA 50 mg by mouth twice a day, which was resumed on 03/28/12, for APL.  She voices no complaints today, no fevers, chills, or night sweats. No shortness of breath or chest pain. No bleeding or bruising symptoms. Energy level is actually quite good. She is not experiencing any unexplained weight loss, no drenching night sweats. She denies nausea, emesis, diarrhea, or constipation issues.  She is aware that her blood glucose continues to be high.  She has not been seen in Primary Care for "awhile" A detailed review of systems is otherwise noncontributory as noted below.  Review of Systems: Constitutional:  no weight loss, fever, night sweats and feels well Eyes: no complaints ENT: no complaints Cardiovascular: no chest pain or dyspnea on exertion Respiratory: no cough, shortness of breath, or wheezing Neurological: no TIA or stroke symptoms Dermatological: negative Gastrointestinal: no abdominal pain, change in bowel habits, or black or bloody stools Genito-Urinary: no dysuria, trouble voiding, or hematuria Hematological and Lymphatic: negative Breast: negative Musculoskeletal: negative Remaining ROS negative.  Medications:   I have reviewed the patient's current medications.  Current Outpatient Prescriptions  Medication Sig Dispense  Refill  . glucose blood (ACCU-CHEK ACTIVE STRIPS) test strip Use as instructed  100 each  12  . insulin glargine (LANTUS) 100 UNIT/ML injection Inject 35 Units into the skin daily.  1 vial  12  . lisinopril (PRINIVIL,ZESTRIL) 10 MG tablet Take 1 tablet (10 mg total) by mouth daily.  30 tablet  4  . magnesium chloride (SLOW-MAG) 64 MG TBEC Take 2 tablets by mouth daily.      . metFORMIN (GLUCOPHAGE) 1000 MG tablet Take 1 tablet (1,000 mg total) by mouth daily with breakfast.  90 tablet  4  . MICONAZOLE NITRATE VAGINAL (MONISTAT 3) 4 % CREA Place 1 application vaginally as directed.  1 Tube  0  . tretinoin (VESANOID) 10 MG capsule Take 50 mg by mouth 2 (two) times daily.      Marland Kitchen dexamethasone (DECADRON) 4 MG tablet Take 2 tablets by mouth daily starting the day after chemotherapy for 2 days. Take with food.  30 tablet  1  . Insulin Syringe-Needle U-100 26G X 1/2" 1 ML MISC 35 Units by Does not apply route AC breakfast.  100 each  6  . LORazepam (ATIVAN) 0.5 MG tablet Take 1 tablet (0.5 mg total) by mouth every 6 (six) hours as needed (Nausea or vomiting).  30 tablet  0  . ondansetron (ZOFRAN) 8 MG tablet Take 1 tablet two times a day starting the day after chemo for 2 days. Then take 1 tablet two times a day as needed for nausea or vomiting.  30 tablet  1  . prochlorperazine (COMPAZINE) 10 MG tablet Take 1 tablet (10 mg total) by mouth every 6 (six) hours as needed (Nausea or vomiting).  30 tablet  1  . prochlorperazine (COMPAZINE) 25 MG suppository  Place 1 suppository (25 mg total) rectally every 12 (twelve) hours as needed for nausea.  12 suppository  3  . promethazine (PHENERGAN) 12.5 MG tablet Take 12.5 mg by mouth every 8 (eight) hours as needed. For nausea      . DISCONTD: glipiZIDE (GLUCOTROL) 5 MG tablet Take 1 tablet (5 mg total) by mouth 2 (two) times daily before a meal.  60 tablet  0   No current facility-administered medications for this visit.   Facility-Administered Medications Ordered  in Other Visits  Medication Dose Route Frequency Provider Last Rate Last Dose  . arsenic trioxide (TRISENOX) 12 mg in sodium chloride 0.9 % 250 mL chemo Infusion  0.146 mg/kg/day (Order-Specific) Intravenous Daily Pierce Crane, MD   12 mg at 03/15/12 1447  . sodium chloride 0.9 % injection 10 mL  10 mL Intracatheter PRN Pierce Crane, MD   10 mL at 03/15/12 1706    Allergies:  Allergies  Allergen Reactions  . Ultram (Tramadol Hcl) Nausea And Vomiting    Physical Exam: Filed Vitals:   03/30/12 1014  BP: 112/76  Pulse: 94  Temp: 98.5 F (36.9 C)    Body mass index is 48.38 kg/(m^2). Weight: 264 lbs. HEENT:  Sclerae anicteric, conjunctivae pink.  Oropharynx clear.  No mucositis or candidiasis.   Nodes:  No cervical, supraclavicular, or axillary lymphadenopathy palpated.   Lungs:  Clear to auscultation bilaterally.  No crackles, rhonchi, or wheezes.   Heart:  Regular rate and rhythm. Her left anterior chest wall PORT site has healed, no evidence of erythema. Abdomen:  Soft, nontender.  Positive bowel sounds.  No organomegaly or masses palpated.   Musculoskeletal:  No focal spinal tenderness to palpation.  Extremities:  Benign.  No peripheral edema or cyanosis.   Skin:  Benign.   Neuro:  Nonfocal, alert and oriented x 3   Lab Results: Lab Results  Component Value Date   WBC 5.7 03/30/2012   HGB 11.1* 03/30/2012   HCT 32.8* 03/30/2012   MCV 89.9 03/30/2012   PLT 240 03/30/2012   NEUTROABS 3.8 03/30/2012     Chemistry      Component Value Date/Time   NA 137 03/30/2012 0919   K 3.7 03/30/2012 0919   CL 103 03/30/2012 0919   CO2 20 03/30/2012 0919   BUN 10 03/30/2012 0919   CREATININE 0.48* 03/30/2012 0919   GLU 479* 03/01/2012 1210      Component Value Date/Time   CALCIUM 9.8 03/30/2012 0919   ALKPHOS 88 03/30/2012 0919   AST 17 03/30/2012 0919   ALT 22 03/30/2012 0919   BILITOT 0.5 03/30/2012 0919      No results found for this basename: LABCA2     12 lead EKG: Obtained  03/30/2012--Evidence of mild QT interval elongation. (469 ms)  Assessment:  A 26 year old [place] woman with acute myelocytic leukemia currently day 38, cycle 2 of arsenic trioxide given on days 1-5, 8-12, 15-19, 22-26  on a q42month  regimen.  Oral ATRA per protocol 50mg  orally twice a day resumed on 03/28/12.   2. Diabetes mellitus type 2, with HbgA1c reported at 10.5  3. Recent cholecystitis/laparoscopic cholecystectomy  4. History of Klebsiella bacteremia  5. History of hypomagnesemia, magnesium level normal today.  6. Reported vaginal candidosis, resolved.  This case reviewed with Dr. Pierce Crane, who also spoke with patient and her family.   Plan:  Patient will be seen in one week's time to include CBC, serum chemistry, and magnesium level.  She will continue on ATRA 50mg  po BID.  She will be referred back to her primary care doctor through [facility] for assistance in managing her type 2 diabetes. A prescription for K-Dur per day, and for lorazepam 0.5mg  tabs were provided. This plan was reviewed with the patient, who voices understanding and agreement.  She knows to call with any changes or problems.   Jakya Dovidio T, PA-C 03/30/12

## 2012-04-04 ENCOUNTER — Other Ambulatory Visit: Payer: Self-pay | Admitting: Physician Assistant

## 2012-04-04 DIAGNOSIS — C9240 Acute promyelocytic leukemia, not having achieved remission: Secondary | ICD-10-CM

## 2012-04-05 ENCOUNTER — Other Ambulatory Visit (HOSPITAL_BASED_OUTPATIENT_CLINIC_OR_DEPARTMENT_OTHER): Payer: Medicaid Other | Admitting: Lab

## 2012-04-05 ENCOUNTER — Ambulatory Visit (HOSPITAL_BASED_OUTPATIENT_CLINIC_OR_DEPARTMENT_OTHER): Payer: Medicaid Other | Admitting: Physician Assistant

## 2012-04-05 ENCOUNTER — Encounter: Payer: Self-pay | Admitting: Physician Assistant

## 2012-04-05 VITALS — BP 145/89 | HR 93 | Temp 98.7°F | Ht 62.0 in | Wt 268.2 lb

## 2012-04-05 DIAGNOSIS — C9200 Acute myeloblastic leukemia, not having achieved remission: Secondary | ICD-10-CM

## 2012-04-05 DIAGNOSIS — E119 Type 2 diabetes mellitus without complications: Secondary | ICD-10-CM

## 2012-04-05 DIAGNOSIS — C9240 Acute promyelocytic leukemia, not having achieved remission: Secondary | ICD-10-CM

## 2012-04-05 LAB — CBC WITH DIFFERENTIAL/PLATELET
BASO%: 1.2 % (ref 0.0–2.0)
LYMPH%: 30.5 % (ref 14.0–49.7)
MCH: 30 pg (ref 25.1–34.0)
MCHC: 33.5 g/dL (ref 31.5–36.0)
MCV: 89.6 fL (ref 79.5–101.0)
MONO%: 6.4 % (ref 0.0–14.0)
Platelets: 256 10*3/uL (ref 145–400)
RBC: 3.73 10*6/uL (ref 3.70–5.45)

## 2012-04-05 LAB — COMPREHENSIVE METABOLIC PANEL
ALT: 19 U/L (ref 0–35)
Alkaline Phosphatase: 85 U/L (ref 39–117)
Glucose, Bld: 160 mg/dL — ABNORMAL HIGH (ref 70–99)
Sodium: 138 mEq/L (ref 135–145)
Total Bilirubin: 0.6 mg/dL (ref 0.3–1.2)
Total Protein: 7.2 g/dL (ref 6.0–8.3)

## 2012-04-05 LAB — MAGNESIUM: Magnesium: 1.7 mg/dL (ref 1.5–2.5)

## 2012-04-07 NOTE — Progress Notes (Signed)
Hematology and Oncology Follow Up Visit  Melissa Washington [id number] August 04, 1986 25 y.o. 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  2. Type 2 diabetes mellitus.  3. Recent hospitalization for cholecystitis for which she subsequently underwent a laparoscopic cholecystectomy.  4. History of Klebsiella bacteremia.  Interim History:   Melissa Washington is seen today, currently day 83, cycle 2 of arsenic trioxide based therapy given concomitantly with ATRA 50 mg by mouth twice a day, which was resumed on 03/28/12, for APL.  She is complaining of shoulder, back, and hip pain.  She denies fevers, chills, or night sweats. No shortness of breath or chest pain. No bleeding or bruising symptoms. Energy level down a bit. She is not experiencing any unexplained weight loss, no drenching night sweats. She denies nausea, emesis, diarrhea, or constipation issues.  She is scheduled for follow up at [facility] 04/22/12. A detailed review of systems is otherwise noncontributory as noted below.  Review of Systems: Constitutional:  no weight loss, fever, night sweats and feels well Eyes: no complaints ENT: no complaints Cardiovascular: no chest pain or dyspnea on exertion Respiratory: no cough, shortness of breath, or wheezing Neurological: no TIA or stroke symptoms Dermatological: negative Gastrointestinal: no abdominal pain, change in bowel habits, or black or bloody stools Genito-Urinary: no dysuria, trouble voiding, or hematuria Hematological and Lymphatic: negative Breast: negative Musculoskeletal: negative Remaining ROS negative.  Medications:   I have reviewed the patient's current medications.  Current Outpatient Prescriptions  Medication Sig Dispense Refill  . dexamethasone (DECADRON) 4 MG tablet Take  2 tablets by mouth daily starting the day after chemotherapy for 2 days. Take with food.  30 tablet  1  . glucose blood (ACCU-CHEK ACTIVE STRIPS) test strip Use as instructed  100 each  12  . insulin glargine (LANTUS) 100 UNIT/ML injection Inject 35 Units into the skin daily.  1 vial  12  . Insulin Syringe-Needle U-100 26G X 1/2" 1 ML MISC 35 Units by Does not apply route AC breakfast.  100 each  6  . lisinopril (PRINIVIL,ZESTRIL) 10 MG tablet Take 1 tablet (10 mg total) by mouth daily.  30 tablet  4  . LORazepam (ATIVAN) 0.5 MG tablet Take 1 tablet (0.5 mg total) by mouth every 6 (six) hours as needed (Nausea or vomiting).  30 tablet  0  . magnesium chloride (SLOW-MAG) 64 MG TBEC Take 2 tablets by mouth daily.      . metFORMIN (GLUCOPHAGE) 1000 MG tablet Take 1 tablet (1,000 mg total) by mouth daily with breakfast.  90 tablet  4  . MICONAZOLE NITRATE VAGINAL (MONISTAT 3) 4 % CREA Place 1 application vaginally as directed.  1 Tube  0  . ondansetron (ZOFRAN) 8 MG tablet Take 1 tablet two times a day starting the day after chemo for 2 days. Then take 1 tablet two times a day as needed for nausea or vomiting.  30 tablet  1  . prochlorperazine (COMPAZINE) 10 MG tablet Take 1 tablet (10 mg total) by mouth every 6 (six) hours as needed (Nausea or vomiting).  30 tablet  1  . prochlorperazine (COMPAZINE) 25 MG suppository Place 1 suppository (25 mg total) rectally every 12 (twelve) hours as needed for nausea.  12 suppository  3  . promethazine (PHENERGAN) 12.5 MG tablet Take  12.5 mg by mouth every 8 (eight) hours as needed. For nausea      . tretinoin (VESANOID) 10 MG capsule Take 50 mg by mouth 2 (two) times daily.      Marland Kitchen DISCONTD: glipiZIDE (GLUCOTROL) 5 MG tablet Take 1 tablet (5 mg total) by mouth 2 (two) times daily before a meal.  60 tablet  0   No current facility-administered medications for this visit.   Facility-Administered Medications Ordered in Other Visits  Medication Dose Route Frequency  Provider Last Rate Last Dose  . arsenic trioxide (TRISENOX) 12 mg in sodium chloride 0.9 % 250 mL chemo Infusion  0.146 mg/kg/day (Order-Specific) Intravenous Daily Pierce Crane, MD   12 mg at 03/15/12 1447  . sodium chloride 0.9 % injection 10 mL  10 mL Intracatheter PRN Pierce Crane, MD   10 mL at 03/15/12 1706    Allergies:  Allergies  Allergen Reactions  . Ultram (Tramadol Hcl) Nausea And Vomiting    Physical Exam: Filed Vitals:   04/05/12 1513  BP: 145/89  Pulse: 93  Temp: 98.7 F (37.1 C)    Body mass index is 49.05 kg/(m^2). Weight: 268 lbs. HEENT:  Sclerae anicteric, conjunctivae pink.  Oropharynx clear.  No mucositis or candidiasis.   Nodes:  No cervical, supraclavicular, or axillary lymphadenopathy palpated.   Lungs:  Clear to auscultation bilaterally.  No crackles, rhonchi, or wheezes.   Heart:  Regular rate and rhythm. Her left anterior chest wall PORT site has healed, no evidence of erythema. Abdomen:  Soft, nontender.  Positive bowel sounds.  No organomegaly or masses palpated.   Musculoskeletal:  She denies any Homero Fellers evidence of discomfort with gentle palpation over her thoracic cervical and lumbar spine region.  When she gets up out of a chair, she does appear to have a bit of "stiffness" in her hip area. Extremities:  Benign.  No peripheral edema or cyanosis.   Skin:  Benign.   Neuro:  Nonfocal, alert and oriented x 3   Lab Results: Lab Results  Component Value Date   WBC 5.8 04/05/2012   HGB 11.2* 04/05/2012   HCT 33.4* 04/05/2012   MCV 89.6 04/05/2012   PLT 256 04/05/2012   NEUTROABS 3.5 04/05/2012     Chemistry      Component Value Date/Time   NA 138 04/05/2012 1455   K 3.5 04/05/2012 1455   CL 103 04/05/2012 1455   CO2 21 04/05/2012 1455   BUN 10 04/05/2012 1455   CREATININE 0.49* 04/05/2012 1455   GLU 479* 03/01/2012 1210      Component Value Date/Time   CALCIUM 9.5 04/05/2012 1455   ALKPHOS 85 04/05/2012 1455   AST 18 04/05/2012 1455   ALT 19 04/05/2012  1455   BILITOT 0.6 04/05/2012 1455      No results found for this basename: LABCA2     12 lead EKG: Obtained 03/30/2012--Evidence of mild QT interval elongation. (469 ms)  Assessment:  A 26 year old [place] woman with acute myelocytic leukemia currently day 44, cycle 2 of arsenic trioxide given on days 1-5, 8-12, 15-19, 22-26  on a q7month  regimen.  Oral ATRA per protocol 50mg  orally twice a day resumed on 03/28/12.   2. Diabetes mellitus type 2, with HbgA1c reported at 10.5  3. Recent cholecystitis/laparoscopic cholecystectomy  4. History of Klebsiella bacteremia  5. History of hypomagnesemia, magnesium level normal today.  6. Reported vaginal candidosis, resolved.  This case reviewed with Dr. Pierce Crane, who also spoke  with patient.   Plan:  Patient will be seen in one week's time to include CBC, serum chemistry, and magnesium level.  She will continue on ATRA 50mg  po BID through 04/11/12.  I have provided her prescription of Percocet 5/325. She will monitor for fevers, instructed not to hesitate contacting us if her symptomatology should worsen. This plan was reviewed with the patient, who voices understanding and agreement.  She knows to call with any changes or problems.   Francesa Eugenio T, PA-C 04/05/12

## 2012-04-08 ENCOUNTER — Other Ambulatory Visit: Payer: Self-pay | Admitting: *Deleted

## 2012-04-08 MED ORDER — OXYCODONE-ACETAMINOPHEN 5-325 MG PO TABS: ORAL_TABLET | ORAL | Status: DC

## 2012-04-11 ENCOUNTER — Telehealth: Payer: Self-pay | Admitting: *Deleted

## 2012-04-11 NOTE — Telephone Encounter (Signed)
Patient called reporting she has not had relief of pain with the generic percocet 5-325 mg. Takes two tabs q6-8 hrs with no relief.  Reportedly was instructed to call today to have this increased to 10 mg tabs if no relief.  "This pain medicine is not working."  Will notify providers.

## 2012-04-12 ENCOUNTER — Ambulatory Visit (HOSPITAL_BASED_OUTPATIENT_CLINIC_OR_DEPARTMENT_OTHER): Payer: Medicaid Other | Admitting: Physician Assistant

## 2012-04-12 ENCOUNTER — Other Ambulatory Visit: Payer: Self-pay | Admitting: *Deleted

## 2012-04-12 ENCOUNTER — Other Ambulatory Visit (HOSPITAL_BASED_OUTPATIENT_CLINIC_OR_DEPARTMENT_OTHER): Payer: Medicaid Other | Admitting: Lab

## 2012-04-12 ENCOUNTER — Ambulatory Visit: Payer: Medicaid Other | Admitting: Lab

## 2012-04-12 VITALS — BP 148/92 | HR 85 | Temp 98.4°F | Ht 62.0 in | Wt 271.3 lb

## 2012-04-12 DIAGNOSIS — C9200 Acute myeloblastic leukemia, not having achieved remission: Secondary | ICD-10-CM

## 2012-04-12 DIAGNOSIS — C9240 Acute promyelocytic leukemia, not having achieved remission: Secondary | ICD-10-CM

## 2012-04-12 DIAGNOSIS — R300 Dysuria: Secondary | ICD-10-CM

## 2012-04-12 LAB — CBC WITH DIFFERENTIAL/PLATELET
Basophils Absolute: 0.1 10*3/uL (ref 0.0–0.1)
Eosinophils Absolute: 0.1 10*3/uL (ref 0.0–0.5)
HCT: 33.1 % — ABNORMAL LOW (ref 34.8–46.6)
HGB: 11.3 g/dL — ABNORMAL LOW (ref 11.6–15.9)
MONO#: 0.5 10*3/uL (ref 0.1–0.9)
NEUT%: 66.3 % (ref 38.4–76.8)
WBC: 7.3 10*3/uL (ref 3.9–10.3)
lymph#: 1.9 10*3/uL (ref 0.9–3.3)

## 2012-04-12 LAB — COMPREHENSIVE METABOLIC PANEL
AST: 18 U/L (ref 0–37)
Albumin: 4 g/dL (ref 3.5–5.2)
BUN: 15 mg/dL (ref 6–23)
Calcium: 10.1 mg/dL (ref 8.4–10.5)
Chloride: 102 mEq/L (ref 96–112)
Glucose, Bld: 286 mg/dL — ABNORMAL HIGH (ref 70–99)
Potassium: 3.9 mEq/L (ref 3.5–5.3)

## 2012-04-12 NOTE — Progress Notes (Signed)
Hematology and Oncology Follow Up Visit  Melissa Washington [id number] 1985-11-14 26 y.o. 04/12/12   HPI: A 26 year old [place] 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  Due to initiate day 1, cycle 3 of arsenic trioxide given on days 1-5, 8-12, 15-19, 22-26 every 2 months on 04/18/12.  Oral ATRA per protocol 50mg  orally twice a day for 2 weeks on, two weeks off, completed on 04/11/12.   2. Diabetes mellitus type 2, with HbgA1c reported at 10.5  3. Recent cholecystitis/laparoscopic cholecystectomy  4. History of Klebsiella bacteremia  5. History of hypomagnesemia, magnesium level normal today.  6. Reported vaginal candidosis, resolved.    Interim History:   Melissa Washington is seen today in follow up.  She is complaining of continued shoulder, back, and right hip pain.  Percocet 5/325, 2 every 6 hours has not provided much relief.  She has also tried ibuprofen 800mg  without improvement.   Upon review with pharmacy, ATRA causes bone pain in 24% of those taking it. She denies fevers, chills, or night sweats. No shortness of breath or chest pain. No bleeding or bruising symptoms. Energy level down a bit. She is not experiencing any unexplained weight loss, no drenching night sweats. She denies nausea, emesis, diarrhea, or constipation issues.  She is scheduled for follow up at [facility] 04/22/12. A detailed review of systems is otherwise noncontributory as noted below.  Review of Systems: Constitutional:  no weight loss, fever, night sweats and feels well Eyes: no complaints ENT: no complaints Cardiovascular: no chest pain or dyspnea on exertion Respiratory: no cough, shortness of breath, or wheezing Neurological: no TIA or stroke symptoms Dermatological: negative Gastrointestinal: no abdominal pain, change in bowel habits, or black or bloody stools Genito-Urinary: no dysuria, trouble voiding, or hematuria Hematological and Lymphatic: negative Breast: negative Musculoskeletal:  negative Remaining ROS negative.  Medications:   I have reviewed the patient's current medications.  Current Outpatient Prescriptions  Medication Sig Dispense Refill  . dexamethasone (DECADRON) 4 MG tablet Take 2 tablets by mouth daily starting the day after chemotherapy for 2 days. Take with food.  30 tablet  1  . glucose blood (ACCU-CHEK ACTIVE STRIPS) test strip Use as instructed  100 each  12  . insulin glargine (LANTUS) 100 UNIT/ML injection Inject 35 Units into the skin daily.  1 vial  12  . Insulin Syringe-Needle U-100 26G X 1/2" 1 ML MISC 35 Units by Does not apply route AC breakfast.  100 each  6  . lisinopril (PRINIVIL,ZESTRIL) 10 MG tablet Take 1 tablet (10 mg total) by mouth daily.  30 tablet  4  . LORazepam (ATIVAN) 0.5 MG tablet Take 1 tablet (0.5 mg total) by mouth every 6 (six) hours as needed (Nausea or vomiting).  30 tablet  0  . magnesium chloride (SLOW-MAG) 64 MG TBEC Take 2 tablets by mouth daily.      . metFORMIN (GLUCOPHAGE) 1000 MG tablet Take 1 tablet (1,000 mg total) by mouth daily with breakfast.  90 tablet  4  . MICONAZOLE NITRATE VAGINAL (MONISTAT 3) 4 % CREA Place 1 application vaginally as directed.  1 Tube  0  . ondansetron (ZOFRAN) 8 MG tablet Take 1 tablet two times a day starting the day after chemo for 2 days. Then take 1 tablet two times a day as needed for nausea or vomiting.  30 tablet  1  . oxyCODONE-acetaminophen (PERCOCET/ROXICET) 5-325 MG per tablet Take 2 tabs every 6 to 8 hours as needed for pain  100  tablet  0  . prochlorperazine (COMPAZINE) 10 MG tablet Take 1 tablet (10 mg total) by mouth every 6 (six) hours as needed (Nausea or vomiting).  30 tablet  1  . prochlorperazine (COMPAZINE) 25 MG suppository Place 1 suppository (25 mg total) rectally every 12 (twelve) hours as needed for nausea.  12 suppository  3  . promethazine (PHENERGAN) 12.5 MG tablet Take 12.5 mg by mouth every 8 (eight) hours as needed. For nausea      . tretinoin (VESANOID) 10  MG capsule Take 50 mg by mouth 2 (two) times daily.      Marland Kitchen DISCONTD: glipiZIDE (GLUCOTROL) 5 MG tablet Take 1 tablet (5 mg total) by mouth 2 (two) times daily before a meal.  60 tablet  0   No current facility-administered medications for this visit.   Facility-Administered Medications Ordered in Other Visits  Medication Dose Route Frequency Provider Last Rate Last Dose  . arsenic trioxide (TRISENOX) 12 mg in sodium chloride 0.9 % 250 mL chemo Infusion  0.146 mg/kg/day (Order-Specific) Intravenous Daily Pierce Crane, MD   12 mg at 03/15/12 1447  . sodium chloride 0.9 % injection 10 mL  10 mL Intracatheter PRN Pierce Crane, MD   10 mL at 03/15/12 1706    Allergies:  Allergies  Allergen Reactions  . Ultram (Tramadol Hcl) Nausea And Vomiting    Physical Exam: Filed Vitals:   04/12/12 1340  BP: 148/92  Pulse: 85  Temp: 98.4 F (36.9 C)    Body mass index is 49.62 kg/(m^2). Weight: 271 lbs. HEENT:  Sclerae anicteric, conjunctivae pink.  Oropharynx clear.  No mucositis or candidiasis.   Nodes:  No cervical, supraclavicular, or axillary lymphadenopathy palpated.   Lungs:  Clear to auscultation bilaterally.  No crackles, rhonchi, or wheezes.   Heart:  Regular rate and rhythm. Her left anterior chest wall PORT site has healed, no evidence of erythema. Abdomen:  Soft, nontender.  Positive bowel sounds.  No organomegaly or masses palpated.   Musculoskeletal:  She denies any frank evidence of discomfort with gentle palpation over her thoracic cervical and lumbar spine region.  Her right hip, upper later thigh region was examined, no evidence of deformity or bruising noted. Extremities:  Benign.  No peripheral edema or cyanosis.   Skin:  Benign.   Neuro:  Nonfocal, alert and oriented x 3   Lab Results: Lab Results  Component Value Date   WBC 7.3 04/12/2012   HGB 11.3* 04/12/2012   HCT 33.1* 04/12/2012   MCV 89.0 04/12/2012   PLT 243 04/12/2012   NEUTROABS 4.8 04/12/2012     Chemistry       Component Value Date/Time   NA 137 04/12/2012 1322   K 3.9 04/12/2012 1322   CL 102 04/12/2012 1322   CO2 19 04/12/2012 1322   BUN 15 04/12/2012 1322   CREATININE 0.56 04/12/2012 1322   GLU 479* 03/01/2012 1210      Component Value Date/Time   CALCIUM 10.1 04/12/2012 1322   ALKPHOS 102 04/12/2012 1322   AST 18 04/12/2012 1322   ALT 13 04/12/2012 1322   BILITOT 0.5 04/12/2012 1322      No results found for this basename: LABCA2     12 lead EKG: Obtained 03/30/2012--Evidence of mild QT interval elongation. 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  Due to initiate day 1, cycle 3 of arsenic trioxide given on days 1-5, 8-12, 15-19, 22-26 every 2 months  on 04/18/12.  Oral ATRA per protocol 50mg  orally twice a day for 2 weeks on, two weeks off, completed on 04/11/12.   2. Diabetes mellitus type 2, with HbgA1c reported at 10.5  3. Recent cholecystitis/laparoscopic cholecystectomy  4. History of Klebsiella bacteremia  5. History of hypomagnesemia, magnesium level normal today.  6. Reported vaginal candidosis, resolved.  7. Bone pain lower pain/right hip  8. Urinary frequency  This case reviewed with Dr. Pierce Crane  Plan:  Lurena Joiner will submit a urine for UA and culture today.  We have recommended she initiate Claritin 10mg  daily, but presciption for oxycodone 10mg  tabs also provided.  She is scheduled for follow up with Dr. Donnie Coffin on 04/18/12 prior to day 1, cycle 3 arsenic trioxide, unless change in regimen is recommended after her appointment with Dr. Lowell Guitar at [facility] on 8/2/123.  She will monitor for fevers, instructed not to hesitate contacting us if her symptomatology should worsen. This plan was reviewed with the patient, who voices understanding and agreement.  She knows to call with any changes or problems.   Leslye Puccini T, PA-C 04/12/12

## 2012-04-14 ENCOUNTER — Other Ambulatory Visit: Payer: Self-pay | Admitting: Emergency Medicine

## 2012-04-14 ENCOUNTER — Ambulatory Visit (INDEPENDENT_AMBULATORY_CARE_PROVIDER_SITE_OTHER): Payer: Medicaid Other | Admitting: Family Medicine

## 2012-04-14 ENCOUNTER — Encounter: Payer: Self-pay | Admitting: Family Medicine

## 2012-04-14 ENCOUNTER — Ambulatory Visit: Payer: Medicaid Other | Admitting: Family Medicine

## 2012-04-14 VITALS — BP 130/90 | HR 92 | Ht 61.0 in | Wt 266.0 lb

## 2012-04-14 DIAGNOSIS — E119 Type 2 diabetes mellitus without complications: Secondary | ICD-10-CM

## 2012-04-14 MED ORDER — GLIPIZIDE 5 MG PO TABS: 5.0000 mg | ORAL_TABLET | Freq: Every day | ORAL | Status: DC

## 2012-04-14 MED ORDER — METFORMIN HCL 1000 MG PO TABS: 1000.0000 mg | ORAL_TABLET | Freq: Two times a day (BID) | ORAL | Status: DC

## 2012-04-14 NOTE — Patient Instructions (Addendum)
Start taking metformin 1000 mg twice a day instead of once  When you start your steroid treatment, add glipizide once a day  Your goal blood sugars are between 80-120 each morning  May need to go up to twice a day when on steroids  Follow-up with your PCP

## 2012-04-15 NOTE — Assessment & Plan Note (Addendum)
Will increase metformin to 1000 mg bid.  When she starts chemotherapy, she will add on glipizide 5 mg daily.  Discussed that we may increase her to Glipizide 5 mg bid during steroid treatment if not well controlled and not having hypoglycemia.  Offered follow-up appt, but feels she is comfortable in home titration.  Advised to call with questions or to report CBG's so I can give additional assistance.  Will otherwise follow-up regularly with PCP.  Update: on further review after patient left, her med list indicated she is also on lantus which was not mentioned in the visit.  I was not able to contact her by phone.  One number OOS, other I left a VM.  I called the pharmacy and put a hold on glipizide and asked her to call me- if confirmed she is actually taking lantus, will plan to titrate up lantus instead.

## 2012-04-15 NOTE — Progress Notes (Signed)
  Subjective:    Patient ID: Melissa Washington, female    DOB: 1986-01-10, 26 y.o.   MRN: [id number]  HPI 26 yo with APML here to discuss treatment for diabetes   States has had type 2 diabetes for 5-6 years.  Will be restarting chemotherapy with steroids in 2 days, would like guidance on how to keep CBG's under control during this time.  During last treatment states CBG's were in the 200-300's  DIABETES  Taking and tolerating: yes: has been taking metformin 1000 mg daily for this Fasting blood sugars:150-178 when not on steroids for chemotherapy.   Hypoglycemic symptoms: no Visual problems: no Monitoring feet: yes Numbness/Tingling: no Last eye exam:unk Diabetic Labs:  Lab Results  Component Value Date   HGBA1C 10.5* 03/21/2012   HGBA1C 8.1* 01/24/2012   HGBA1C 7.7* 11/25/2011   Lab Results  Component Value Date   LDLCALC 20 11/25/2009   CREATININE 0.56 04/12/2012   Last microalbumin: No results found for this basename: MICROALBUR, [id number]     Review of Systems See HPI    Objective:   Physical Exam  GEN: Alert & Oriented, No acute distress CV:  Regular Rate & Rhythm, no murmur Respiratory:  Normal work of breathing, CTAB Abd:  + BS, soft, no tenderness to palpation Ext: no pre-tibial edema       Assessment & Plan:

## 2012-04-18 ENCOUNTER — Other Ambulatory Visit (HOSPITAL_BASED_OUTPATIENT_CLINIC_OR_DEPARTMENT_OTHER): Payer: Medicaid Other | Admitting: Lab

## 2012-04-18 ENCOUNTER — Ambulatory Visit (HOSPITAL_BASED_OUTPATIENT_CLINIC_OR_DEPARTMENT_OTHER): Payer: Medicaid Other

## 2012-04-18 ENCOUNTER — Ambulatory Visit (HOSPITAL_BASED_OUTPATIENT_CLINIC_OR_DEPARTMENT_OTHER): Payer: Medicaid Other | Admitting: Oncology

## 2012-04-18 ENCOUNTER — Other Ambulatory Visit: Payer: Self-pay | Admitting: *Deleted

## 2012-04-18 ENCOUNTER — Telehealth: Payer: Self-pay | Admitting: Family Medicine

## 2012-04-18 VITALS — BP 140/88 | HR 89 | Temp 98.4°F | Ht 61.0 in | Wt 270.1 lb

## 2012-04-18 DIAGNOSIS — C9200 Acute myeloblastic leukemia, not having achieved remission: Secondary | ICD-10-CM

## 2012-04-18 DIAGNOSIS — Z5111 Encounter for antineoplastic chemotherapy: Secondary | ICD-10-CM

## 2012-04-18 DIAGNOSIS — E119 Type 2 diabetes mellitus without complications: Secondary | ICD-10-CM

## 2012-04-18 DIAGNOSIS — M949 Disorder of cartilage, unspecified: Secondary | ICD-10-CM

## 2012-04-18 DIAGNOSIS — C9240 Acute promyelocytic leukemia, not having achieved remission: Secondary | ICD-10-CM

## 2012-04-18 DIAGNOSIS — R350 Frequency of micturition: Secondary | ICD-10-CM

## 2012-04-18 DIAGNOSIS — Z452 Encounter for adjustment and management of vascular access device: Secondary | ICD-10-CM

## 2012-04-18 LAB — CBC WITH DIFFERENTIAL/PLATELET
Basophils Absolute: 0.1 10*3/uL (ref 0.0–0.1)
Eosinophils Absolute: 0.1 10*3/uL (ref 0.0–0.5)
HGB: 11.4 g/dL — ABNORMAL LOW (ref 11.6–15.9)
LYMPH%: 24.7 % (ref 14.0–49.7)
MCV: 89.1 fL (ref 79.5–101.0)
MONO%: 8 % (ref 0.0–14.0)
NEUT#: 4.5 10*3/uL (ref 1.5–6.5)
Platelets: 251 10*3/uL (ref 145–400)

## 2012-04-18 LAB — COMPREHENSIVE METABOLIC PANEL
Alkaline Phosphatase: 84 U/L (ref 39–117)
BUN: 9 mg/dL (ref 6–23)
Creatinine, Ser: 0.55 mg/dL (ref 0.50–1.10)
Glucose, Bld: 284 mg/dL — ABNORMAL HIGH (ref 70–99)
Total Bilirubin: 0.7 mg/dL (ref 0.3–1.2)

## 2012-04-18 MED ORDER — DEXAMETHASONE SODIUM PHOSPHATE 10 MG/ML IJ SOLN
10.0000 mg | Freq: Once | INTRAMUSCULAR | Status: AC
  Administered 2012-04-18: 10 mg via INTRAVENOUS

## 2012-04-18 MED ORDER — ONDANSETRON 8 MG/50ML IVPB (CHCC)
8.0000 mg | Freq: Once | INTRAVENOUS | Status: AC
  Administered 2012-04-18: 8 mg via INTRAVENOUS

## 2012-04-18 MED ORDER — ALTEPLASE 2 MG IJ SOLR
2.0000 mg | Freq: Once | INTRAMUSCULAR | Status: AC | PRN
  Administered 2012-04-18: 2 mg
  Filled 2012-04-18: qty 2

## 2012-04-18 MED ORDER — MAGNESIUM OXIDE 400 (241.3 MG) MG PO TABS: 400.0000 mg | ORAL_TABLET | Freq: Two times a day (BID) | ORAL | Status: DC

## 2012-04-18 MED ORDER — MAGNESIUM OXIDE 400 (241.3 MG) MG PO TABS: 400.0000 mg | ORAL_TABLET | Freq: Every day | ORAL | Status: DC

## 2012-04-18 MED ORDER — SODIUM CHLORIDE 0.9 % IV SOLN
Freq: Once | INTRAVENOUS | Status: AC
  Administered 2012-04-18: 50 mL via INTRAVENOUS

## 2012-04-18 MED ORDER — ARSENIC TRIOXIDE CHEMO INJECTION 10MG/10ML
0.1460 mg/kg/d | Freq: Every day | INTRAVENOUS | Status: DC
  Administered 2012-04-18: 12 mg via INTRAVENOUS
  Filled 2012-04-18: qty 12

## 2012-04-18 NOTE — Telephone Encounter (Signed)
Spoke with Dr. Donnie Coffin- patient in office, morning CBG before treatment today in mid 200's. WIll stop glipizide, increase lantus from 35 units to 40 units.  Asked her to give office a call in 2 days with morning CBG's and will advised increase in lantus.  Plans on treatment with steroids for next 4 weeks.  Updated patient number [phone]

## 2012-04-18 NOTE — Progress Notes (Signed)
1310 - Unable to obtain blood return from port.  Alteplase allowed to dwell for 2 hours.  Dr. Donnie Coffin notified.    1540 - Checked - still unable to obtain blood return from port.  Peripheral IV started.  1700 - checked blood return again - unable to obtain from port.  Dye Study scheduled by MD for 12 tomorrow - patient notified of appointment.  Port de-accessed.

## 2012-04-18 NOTE — Patient Instructions (Addendum)
[facility] Discharge Instructions for Patients Receiving Chemotherapy  Today you received the following chemotherapy agents: arsenic trioxide.  To help prevent nausea and vomiting after your treatment, we encourage you to take your nausea medication. Take it as often as prescribed.    If you develop nausea and vomiting that is not controlled by your nausea medication, call the clinic. If it is after clinic hours your family physician or the after hours number for the clinic or go to the Emergency Department.   BELOW ARE SYMPTOMS THAT SHOULD BE REPORTED IMMEDIATELY:  *FEVER GREATER THAN 100.5 F  *CHILLS WITH OR WITHOUT FEVER  NAUSEA AND VOMITING THAT IS NOT CONTROLLED WITH YOUR NAUSEA MEDICATION  *UNUSUAL SHORTNESS OF BREATH  *UNUSUAL BRUISING OR BLEEDING  TENDERNESS IN MOUTH AND THROAT WITH OR WITHOUT PRESENCE OF ULCERS  *URINARY PROBLEMS  *BOWEL PROBLEMS  UNUSUAL RASH Items with * indicate a potential emergency and should be followed up as soon as possible.  Feel free to call the clinic you have any questions or concerns. The clinic phone number is [phone].   I have been informed and understand all the instructions given to me. I know to contact the clinic, my physician, or go to the Emergency Department if any problems should occur. I do not have any questions at this time, but understand that I may call the clinic during office hours   should I have any questions or need assistance in obtaining follow up care.    __________________________________________  _____________  __________ Signature of Patient or Authorized Representative            Date                   Time    __________________________________________ Nurse's Signature

## 2012-04-18 NOTE — Progress Notes (Signed)
Hematology and Oncology Follow Up Visit  Melissa Washington [id number] 07-10-1986 25 y.o. 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  Due to initiate day 1, cycle 3 of arsenic trioxide given on days 1-5, 8-12, 15-19, 22-26 every 2 months on 04/18/12.  Oral ATRA per protocol 50mg  orally twice a day for 2 weeks on, two weeks off, completed on 04/11/12.   2. Diabetes mellitus type 2, with HbgA1c reported at 10.5  3. Recent cholecystitis/laparoscopic cholecystectomy  4. History of Klebsiella bacteremia  5. History of hypomagnesemia, magnesium level normal today.  6. Reported vaginal candidosis, resolved.    Interim History:   Melissa Washington is seen today in follow up.  Interim she is feeling better. She has been off the after for about a week. She does have oxycodone. Her pain seems to improve when she stop taking the ATRA.. She denies nausea, emesis, diarrhea, or constipation issues.  She is scheduled for follow up at [facility] 04/22/12. Her primary care doctor has given instructions about increasing her Lantus. She will do so and go up to 40 units a day. She will monitor her blood sugars and report back to her primary care Dr. She does not have any urinary urgency issues. A detailed review of systems is otherwise noncontributory as noted below.  Review of Systems: Constitutional:  no weight loss, fever, night sweats and feels well Eyes: no complaints ENT: no complaints Cardiovascular: no chest pain or dyspnea on exertion Respiratory: no cough, shortness of breath, or wheezing Neurological: no TIA or stroke symptoms Dermatological: negative Gastrointestinal: no abdominal pain, change in bowel habits, or black or bloody stools Genito-Urinary: no dysuria, trouble voiding, or hematuria Hematological and Lymphatic: negative Breast: negative Musculoskeletal: negative Remaining ROS negative.  Medications:   I have reviewed the patient's current  medications.  Current Outpatient Prescriptions  Medication Sig Dispense Refill  . glipiZIDE (GLUCOTROL) 5 MG tablet Take 1 tablet (5 mg total) by mouth daily.  30 tablet  3  . glucose blood (ACCU-CHEK ACTIVE STRIPS) test strip Use as instructed  100 each  12  . insulin glargine (LANTUS) 100 UNIT/ML injection Inject 35 Units into the skin daily.  1 vial  12  . Insulin Syringe-Needle U-100 26G X 1/2" 1 ML MISC 35 Units by Does not apply route AC breakfast.  100 each  6  . lisinopril (PRINIVIL,ZESTRIL) 10 MG tablet Take 1 tablet (10 mg total) by mouth daily.  30 tablet  4  . loratadine (CLARITIN) 10 MG tablet Take 10 mg by mouth daily. 1 po daily, #30 04/12/12-CTS      . LORazepam (ATIVAN) 0.5 MG tablet Take 1 tablet (0.5 mg total) by mouth every 6 (six) hours as needed (Nausea or vomiting).  30 tablet  0  . magnesium chloride (SLOW-MAG) 64 MG TBEC Take 2 tablets by mouth daily.      . magnesium oxide (MAG-OX) 400 MG tablet Take 400 mg by mouth daily.      . metFORMIN (GLUCOPHAGE) 1000 MG tablet Take 1 tablet (1,000 mg total) by mouth 2 (two) times daily with a meal.  60 tablet  11  . oxycodone (OXY-IR) 5 MG capsule Take 10 mg by mouth every 3 (three) hours as needed. 1-2 po q3-4h prn pain, #100, 04/12/12-CTS      . oxyCODONE-acetaminophen (PERCOCET/ROXICET) 5-325 MG per tablet Take 2 tabs every 6 to 8 hours as needed for pain  100 tablet  0  . prochlorperazine (  COMPAZINE) 10 MG tablet Take 1 tablet (10 mg total) by mouth every 6 (six) hours as needed (Nausea or vomiting).  30 tablet  1  . prochlorperazine (COMPAZINE) 25 MG suppository Place 1 suppository (25 mg total) rectally every 12 (twelve) hours as needed for nausea.  12 suppository  3  . promethazine (PHENERGAN) 12.5 MG tablet Take 12.5 mg by mouth every 8 (eight) hours as needed. For nausea      . dexamethasone (DECADRON) 4 MG tablet Take 2 tablets by mouth daily starting the day after chemotherapy for 2 days. Take with food.  30 tablet  1  .  magnesium oxide (MAG-OX) 400 (241.3 MG) MG tablet Take 1 tablet (400 mg total) by mouth 2 (two) times daily.  60 tablet  4  . magnesium oxide (MAG-OX) 400 (241.3 MG) MG tablet Take 1 tablet (400 mg total) by mouth daily.  1 tablet  1  . MICONAZOLE NITRATE VAGINAL (MONISTAT 3) 4 % CREA Place 1 application vaginally as directed.  1 Tube  0  . ondansetron (ZOFRAN) 8 MG tablet Take 1 tablet two times a day starting the day after chemo for 2 days. Then take 1 tablet two times a day as needed for nausea or vomiting.  30 tablet  1  . tretinoin (VESANOID) 10 MG capsule Take 50 mg by mouth 2 (two) times daily.       No current facility-administered medications for this visit.   Facility-Administered Medications Ordered in Other Visits  Medication Dose Route Frequency Provider Last Rate Last Dose  . arsenic trioxide (TRISENOX) 12 mg in sodium chloride 0.9 % 250 mL chemo Infusion  0.146 mg/kg/day (Order-Specific) Intravenous Daily Pierce Crane, MD   12 mg at 03/15/12 1447  . sodium chloride 0.9 % injection 10 mL  10 mL Intracatheter PRN Pierce Crane, MD   10 mL at 03/15/12 1706    Allergies:  Allergies  Allergen Reactions  . Ultram (Tramadol Hcl) Nausea And Vomiting    Physical Exam: Filed Vitals:   04/18/12 1100  BP: 140/88  Pulse: 89  Temp: 98.4 F (36.9 C)    Body mass index is 51.04 kg/(m^2). Weight: 271 lbs. HEENT:  Sclerae anicteric, conjunctivae pink.  Oropharynx clear.  No mucositis or candidiasis.   Nodes:  No cervical, supraclavicular, or axillary lymphadenopathy palpated.   Lungs:  Clear to auscultation bilaterally.  No crackles, rhonchi, or wheezes.   Heart:  Regular rate and rhythm. Her left anterior chest wall PORT site has healed, no evidence of erythema. Abdomen:  Soft, nontender.  Positive bowel sounds.  No organomegaly or masses palpated.   Musculoskeletal:  She denies any frank evidence of discomfort with gentle palpation over her thoracic cervical and lumbar spine region.   Her right hip, upper later thigh region was examined, no evidence of deformity or bruising noted. Extremities:  Benign.  No peripheral edema or cyanosis.   Skin:  Benign.   Neuro:  Nonfocal, alert and oriented x 3   Lab Results: Lab Results  Component Value Date   WBC 6.9 04/18/2012   HGB 11.4* 04/18/2012   HCT 33.4* 04/18/2012   MCV 89.1 04/18/2012   PLT 251 04/18/2012   NEUTROABS 4.5 04/18/2012     Chemistry      Component Value Date/Time   NA 138 04/18/2012 1009   K 3.8 04/18/2012 1009   CL 102 04/18/2012 1009   CO2 21 04/18/2012 1009   BUN 9 04/18/2012 1009   CREATININE 0.55  04/18/2012 1009   GLU 479* 03/01/2012 1210      Component Value Date/Time   CALCIUM 9.6 04/18/2012 1009   ALKPHOS 84 04/18/2012 1009   AST 18 04/18/2012 1009   ALT 19 04/18/2012 1009   BILITOT 0.7 04/18/2012 1009      No results found for this basename: LABCA2     12 lead EKG: Obtained 03/30/2012--Evidence of mild QT interval elongation. 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  Due to initiate day 1, cycle 3 of arsenic trioxide given on days 1-5, 8-12, 15-19, 22-26 every 2 months on 04/18/12.  Oral ATRA per protocol 50mg  orally twice a day for 2 weeks on, two weeks off, completed on 04/11/12.   2. Diabetes mellitus type 2, with HbgA1c reported at 10.5  3. Recent cholecystitis/laparoscopic cholecystectomy  4. History of Klebsiella bacteremia  5. History of hypomagnesemia, magnesium level normal today.  6. Reported vaginal candidosis, resolved.  7. Bone pain lower pain/right hip  8. Urinary frequency  This case reviewed with Dr. Pierce Crane  Plan:  Melissa Washington .  She knows to call with any changes or problems.   Melissa Washington 04/12/12       Hematology and Oncology Follow Up Visit  Melissa Washington [id number] 1986-06-05 25 y.o. 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  Due to  initiate day 1, cycle 3 of arsenic trioxide given on days 1-5, 8-12, 15-19, 22-26 every 2 months on 04/18/12.  Oral ATRA per protocol 50mg  orally twice a day for 2 weeks on, two weeks off, completed on 04/11/12.   2. Diabetes mellitus type 2, with HbgA1c reported at 10.5  3. Recent cholecystitis/laparoscopic cholecystectomy  4. History of Klebsiella bacteremia  5. History of hypomagnesemia, magnesium level normal today.  6. Reported vaginal candidosis, resolved.    Interim History:   Melissa Washington is seen today in follow up.  She is complaining of continued shoulder, back, and right hip pain.  Percocet 5/325, 2 every 6 hours has not provided much relief.  She has also tried ibuprofen 800mg  without improvement.   Upon review with pharmacy, ATRA causes bone pain in 77% of those taking it. She denies fevers, chills, or night sweats. No shortness of breath or chest pain. No bleeding or bruising symptoms. Energy level down a bit. She is not experiencing any unexplained weight loss, no drenching night sweats. She denies nausea, emesis, diarrhea, or constipation issues.  She is scheduled for follow up at [facility] 04/22/12. A detailed review of systems is otherwise noncontributory as noted below.  Review of Systems: Constitutional:  no weight loss, fever, night sweats and feels well Eyes: no complaints ENT: no complaints Cardiovascular: no chest pain or dyspnea on exertion Respiratory: no cough, shortness of breath, or wheezing Neurological: no TIA or stroke symptoms Dermatological: negative Gastrointestinal: no abdominal pain, change in bowel habits, or black or bloody stools Genito-Urinary: no dysuria, trouble voiding, or hematuria Hematological and Lymphatic: negative Breast: negative Musculoskeletal: negative Remaining ROS negative.  Medications:   I have reviewed the patient's current medications.  Current Outpatient Prescriptions  Medication Sig Dispense Refill  . glipiZIDE (GLUCOTROL) 5 MG  tablet Take 1 tablet (5 mg total) by mouth daily.  30 tablet  3  . glucose blood (ACCU-CHEK ACTIVE STRIPS) test strip Use as instructed  100 each  12  . insulin glargine (LANTUS) 100 UNIT/ML injection Inject 35 Units into the skin  daily.  1 vial  12  . Insulin Syringe-Needle U-100 26G X 1/2" 1 ML MISC 35 Units by Does not apply route AC breakfast.  100 each  6  . lisinopril (PRINIVIL,ZESTRIL) 10 MG tablet Take 1 tablet (10 mg total) by mouth daily.  30 tablet  4  . loratadine (CLARITIN) 10 MG tablet Take 10 mg by mouth daily. 1 po daily, #30 04/12/12-CTS      . LORazepam (ATIVAN) 0.5 MG tablet Take 1 tablet (0.5 mg total) by mouth every 6 (six) hours as needed (Nausea or vomiting).  30 tablet  0  . magnesium chloride (SLOW-MAG) 64 MG TBEC Take 2 tablets by mouth daily.      . magnesium oxide (MAG-OX) 400 MG tablet Take 400 mg by mouth daily.      . metFORMIN (GLUCOPHAGE) 1000 MG tablet Take 1 tablet (1,000 mg total) by mouth 2 (two) times daily with a meal.  60 tablet  11  . oxycodone (OXY-IR) 5 MG capsule Take 10 mg by mouth every 3 (three) hours as needed. 1-2 po q3-4h prn pain, #100, 04/12/12-CTS      . oxyCODONE-acetaminophen (PERCOCET/ROXICET) 5-325 MG per tablet Take 2 tabs every 6 to 8 hours as needed for pain  100 tablet  0  . prochlorperazine (COMPAZINE) 10 MG tablet Take 1 tablet (10 mg total) by mouth every 6 (six) hours as needed (Nausea or vomiting).  30 tablet  1  . prochlorperazine (COMPAZINE) 25 MG suppository Place 1 suppository (25 mg total) rectally every 12 (twelve) hours as needed for nausea.  12 suppository  3  . promethazine (PHENERGAN) 12.5 MG tablet Take 12.5 mg by mouth every 8 (eight) hours as needed. For nausea      . dexamethasone (DECADRON) 4 MG tablet Take 2 tablets by mouth daily starting the day after chemotherapy for 2 days. Take with food.  30 tablet  1  . magnesium oxide (MAG-OX) 400 (241.3 MG) MG tablet Take 1 tablet (400 mg total) by mouth 2 (two) times daily.  60  tablet  4  . magnesium oxide (MAG-OX) 400 (241.3 MG) MG tablet Take 1 tablet (400 mg total) by mouth daily.  1 tablet  1  . MICONAZOLE NITRATE VAGINAL (MONISTAT 3) 4 % CREA Place 1 application vaginally as directed.  1 Tube  0  . ondansetron (ZOFRAN) 8 MG tablet Take 1 tablet two times a day starting the day after chemo for 2 days. Then take 1 tablet two times a day as needed for nausea or vomiting.  30 tablet  1  . tretinoin (VESANOID) 10 MG capsule Take 50 mg by mouth 2 (two) times daily.       No current facility-administered medications for this visit.   Facility-Administered Medications Ordered in Other Visits  Medication Dose Route Frequency Provider Last Rate Last Dose  . arsenic trioxide (TRISENOX) 12 mg in sodium chloride 0.9 % 250 mL chemo Infusion  0.146 mg/kg/day (Order-Specific) Intravenous Daily Pierce Crane, MD   12 mg at 03/15/12 1447  . sodium chloride 0.9 % injection 10 mL  10 mL Intracatheter PRN Pierce Crane, MD   10 mL at 03/15/12 1706    Allergies:  Allergies  Allergen Reactions  . Ultram (Tramadol Hcl) Nausea And Vomiting    Physical Exam: Filed Vitals:   04/18/12 1100  BP: 140/88  Pulse: 89  Temp: 98.4 F (36.9 C)    Body mass index is 51.04 kg/(m^2). Weight: 271 lbs. HEENT:  Sclerae anicteric, conjunctivae pink.  Oropharynx clear.  No mucositis or candidiasis.   Nodes:  No cervical, supraclavicular, or axillary lymphadenopathy palpated.   Lungs:  Clear to auscultation bilaterally.  No crackles, rhonchi, or wheezes.   Heart:  Regular rate and rhythm. Her left anterior chest wall PORT site has healed, no evidence of erythema. Abdomen:  Soft, nontender.  Positive bowel sounds.  No organomegaly or masses palpated.   Musculoskeletal:  She denies any frank evidence of discomfort with gentle palpation over her thoracic cervical and lumbar spine region.  Her right hip, upper later thigh region was examined, no evidence of deformity or bruising noted. Extremities:   Benign.  No peripheral edema or cyanosis.   Skin:  Benign.   Neuro:  Nonfocal, alert and oriented x 3   Lab Results: Lab Results  Component Value Date   WBC 6.9 04/18/2012   HGB 11.4* 04/18/2012   HCT 33.4* 04/18/2012   MCV 89.1 04/18/2012   PLT 251 04/18/2012   NEUTROABS 4.5 04/18/2012     Chemistry      Component Value Date/Time   NA 138 04/18/2012 1009   K 3.8 04/18/2012 1009   CL 102 04/18/2012 1009   CO2 21 04/18/2012 1009   BUN 9 04/18/2012 1009   CREATININE 0.55 04/18/2012 1009   GLU 479* 03/01/2012 1210      Component Value Date/Time   CALCIUM 9.6 04/18/2012 1009   ALKPHOS 84 04/18/2012 1009   AST 18 04/18/2012 1009   ALT 19 04/18/2012 1009   BILITOT 0.7 04/18/2012 1009      No results found for this basename: LABCA2     12 lead EKG: Obtained 03/30/2012--Evidence of mild QT interval elongation. 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  Due to initiate day 1, cycle 3 of arsenic trioxide given on days 1-5, 8-12, 15-19, 22-26 every 2 months on 04/18/12.  Oral ATRA per protocol 50mg  orally twice a day for 2 weeks on, two weeks off, completed on 04/11/12.   2. Diabetes mellitus type 2, with HbgA1c reported at 10.5  3. Recent cholecystitis/laparoscopic cholecystectomy  4. History of Klebsiella bacteremia  5. History of hypomagnesemia, magnesium level normal today.  6. Reported vaginal candidosis, resolved.  7. Bone pain lower pain/right hip  8. Urinary frequency  This case reviewed with Dr. Pierce Crane  Plan:  Melissa Washington will submit a urine for UA and culture today.     She will monitor for fevers, instructed not to hesitate contacting us if her symptomatology should worsen. This plan was reviewed with the patient, who voi EKG done today shows a normal QTc interval. Her potassium is low and she has problems taking the larger pills and so I have instructed her to break apart and put him into applesauce. We do have a  recurrent cell phone number which is [phone]. She has an  understanding and agreement.  She knows to call with any changes or problems.   Pierce Crane, Md 04/12/12

## 2012-04-19 ENCOUNTER — Other Ambulatory Visit: Payer: Medicaid Other | Admitting: Lab

## 2012-04-19 ENCOUNTER — Ambulatory Visit: Payer: Medicaid Other | Admitting: Oncology

## 2012-04-19 ENCOUNTER — Ambulatory Visit (HOSPITAL_COMMUNITY)
Admission: RE | Admit: 2012-04-19 | Discharge: 2012-04-19 | Disposition: A | Discharge status: Home / Self Care | Payer: Medicaid Other | Source: Ambulatory Visit | Attending: Oncology | Admitting: Oncology

## 2012-04-19 ENCOUNTER — Ambulatory Visit (HOSPITAL_BASED_OUTPATIENT_CLINIC_OR_DEPARTMENT_OTHER): Payer: Medicaid Other

## 2012-04-19 VITALS — BP 131/64 | HR 83 | Temp 97.2°F

## 2012-04-19 DIAGNOSIS — C9200 Acute myeloblastic leukemia, not having achieved remission: Secondary | ICD-10-CM

## 2012-04-19 DIAGNOSIS — Z452 Encounter for adjustment and management of vascular access device: Secondary | ICD-10-CM | POA: Insufficient documentation

## 2012-04-19 DIAGNOSIS — C9240 Acute promyelocytic leukemia, not having achieved remission: Secondary | ICD-10-CM

## 2012-04-19 DIAGNOSIS — Z5111 Encounter for antineoplastic chemotherapy: Secondary | ICD-10-CM

## 2012-04-19 MED ORDER — DEXAMETHASONE SODIUM PHOSPHATE 10 MG/ML IJ SOLN
10.0000 mg | Freq: Once | INTRAMUSCULAR | Status: AC
  Administered 2012-04-19: 10 mg via INTRAVENOUS

## 2012-04-19 MED ORDER — IOHEXOL 300 MG/ML  SOLN
10.0000 mL | Freq: Once | INTRAMUSCULAR | Status: AC | PRN
  Administered 2012-04-19: 10 mL via INTRAVENOUS

## 2012-04-19 MED ORDER — SODIUM CHLORIDE 0.9 % IJ SOLN
10.0000 mL | INTRAMUSCULAR | Status: DC | PRN
  Filled 2012-04-19: qty 10

## 2012-04-19 MED ORDER — HEPARIN SOD (PORK) LOCK FLUSH 100 UNIT/ML IV SOLN
500.0000 [IU] | Freq: Once | INTRAVENOUS | Status: AC | PRN
  Filled 2012-04-19: qty 5

## 2012-04-19 MED ORDER — SODIUM CHLORIDE 0.9 % IV SOLN
0.1460 mg/kg/d | Freq: Every day | INTRAVENOUS | Status: DC
  Administered 2012-04-19: 12 mg via INTRAVENOUS
  Filled 2012-04-19: qty 12

## 2012-04-19 MED ORDER — SODIUM CHLORIDE 0.9 % IV SOLN
Freq: Once | INTRAVENOUS | Status: AC
  Administered 2012-04-19: 14:00:00 via INTRAVENOUS

## 2012-04-19 MED ORDER — ONDANSETRON 8 MG/50ML IVPB (CHCC)
8.0000 mg | Freq: Once | INTRAVENOUS | Status: AC
  Administered 2012-04-19: 8 mg via INTRAVENOUS

## 2012-04-19 NOTE — Patient Instructions (Addendum)
[facility] Discharge Instructions for Patients Receiving Chemotherapy  Today you received the following chemotherapy agents --Arsenic.  To help prevent nausea and vomiting after your treatment, we encourage you to take your nausea medication   If you develop nausea and vomiting that is not controlled by your nausea medication, call the clinic. If it is after clinic hours your family physician or the after hours number for the clinic or go to the Emergency Department.   BELOW ARE SYMPTOMS THAT SHOULD BE REPORTED IMMEDIATELY:  *FEVER GREATER THAN 100.5 F  *CHILLS WITH OR WITHOUT FEVER  NAUSEA AND VOMITING THAT IS NOT CONTROLLED WITH YOUR NAUSEA MEDICATION  *UNUSUAL SHORTNESS OF BREATH  *UNUSUAL BRUISING OR BLEEDING  TENDERNESS IN MOUTH AND THROAT WITH OR WITHOUT PRESENCE OF ULCERS  *URINARY PROBLEMS  *BOWEL PROBLEMS  UNUSUAL RASH Items with * indicate a potential emergency and should be followed up as soon as possible.  One of the nurses will contact you 24 hours after your treatment. Please let the nurse know about any problems that you may have experienced. Feel free to call the clinic you have any questions or concerns. The clinic phone number is [phone].   I have been informed and understand all the instructions given to me. I know to contact the clinic, my physician, or go to the Emergency Department if any problems should occur. I do not have any questions at this time, but understand that I may call the clinic during office hours   should I have any questions or need assistance in obtaining follow up care.    __________________________________________  _____________  __________ Signature of Patient or Authorized Representative            Date                   Time    __________________________________________ Nurse's Signature    

## 2012-04-20 ENCOUNTER — Telehealth: Payer: Self-pay | Admitting: *Deleted

## 2012-04-20 ENCOUNTER — Ambulatory Visit: Payer: Medicaid Other

## 2012-04-20 NOTE — Telephone Encounter (Signed)
patient called in and cancelled treatment for 04-20-2012

## 2012-04-21 ENCOUNTER — Ambulatory Visit (HOSPITAL_BASED_OUTPATIENT_CLINIC_OR_DEPARTMENT_OTHER): Payer: Medicaid Other

## 2012-04-21 ENCOUNTER — Other Ambulatory Visit: Payer: Self-pay | Admitting: Oncology

## 2012-04-21 ENCOUNTER — Ambulatory Visit: Payer: Medicaid Other

## 2012-04-21 DIAGNOSIS — C9200 Acute myeloblastic leukemia, not having achieved remission: Secondary | ICD-10-CM

## 2012-04-21 DIAGNOSIS — Z5111 Encounter for antineoplastic chemotherapy: Secondary | ICD-10-CM

## 2012-04-21 DIAGNOSIS — C9240 Acute promyelocytic leukemia, not having achieved remission: Secondary | ICD-10-CM

## 2012-04-21 MED ORDER — DEXAMETHASONE SODIUM PHOSPHATE 10 MG/ML IJ SOLN
10.0000 mg | Freq: Once | INTRAMUSCULAR | Status: AC
  Administered 2012-04-21: 10 mg via INTRAVENOUS

## 2012-04-21 MED ORDER — SODIUM CHLORIDE 0.9 % IV SOLN
Freq: Once | INTRAVENOUS | Status: AC
  Administered 2012-04-21: 14:00:00 via INTRAVENOUS

## 2012-04-21 MED ORDER — SODIUM CHLORIDE 0.9 % IV SOLN
0.1460 mg/kg/d | Freq: Every day | INTRAVENOUS | Status: DC
  Administered 2012-04-21: 12 mg via INTRAVENOUS
  Filled 2012-04-21: qty 12

## 2012-04-21 MED ORDER — ONDANSETRON 8 MG/50ML IVPB (CHCC)
8.0000 mg | Freq: Once | INTRAVENOUS | Status: AC
  Administered 2012-04-21: 8 mg via INTRAVENOUS

## 2012-04-21 MED ORDER — MEDROXYPROGESTERONE ACETATE 150 MG/ML IM SUSP: 150.0000 mg | Freq: Once | INTRAMUSCULAR | Status: DC

## 2012-04-21 NOTE — Progress Notes (Signed)
1420--Patient having issues with PAC, to be replaced, request to have peripheral IV until replacement.

## 2012-04-21 NOTE — Patient Instructions (Signed)
[facility] Discharge Instructions for Patients Receiving Chemotherapy  Today you received the following chemotherapy agents --Arsenic.  To help prevent nausea and vomiting after your treatment, we encourage you to take your nausea medication   If you develop nausea and vomiting that is not controlled by your nausea medication, call the clinic. If it is after clinic hours your family physician or the after hours number for the clinic or go to the Emergency Department.   BELOW ARE SYMPTOMS THAT SHOULD BE REPORTED IMMEDIATELY:  *FEVER GREATER THAN 100.5 F  *CHILLS WITH OR WITHOUT FEVER  NAUSEA AND VOMITING THAT IS NOT CONTROLLED WITH YOUR NAUSEA MEDICATION  *UNUSUAL SHORTNESS OF BREATH  *UNUSUAL BRUISING OR BLEEDING  TENDERNESS IN MOUTH AND THROAT WITH OR WITHOUT PRESENCE OF ULCERS  *URINARY PROBLEMS  *BOWEL PROBLEMS  UNUSUAL RASH Items with * indicate a potential emergency and should be followed up as soon as possible.  One of the nurses will contact you 24 hours after your treatment. Please let the nurse know about any problems that you may have experienced. Feel free to call the clinic you have any questions or concerns. The clinic phone number is [phone].   I have been informed and understand all the instructions given to me. I know to contact the clinic, my physician, or go to the Emergency Department if any problems should occur. I do not have any questions at this time, but understand that I may call the clinic during office hours   should I have any questions or need assistance in obtaining follow up care.    __________________________________________  _____________  __________ Signature of Patient or Authorized Representative            Date                   Time    __________________________________________ Nurse's Signature

## 2012-04-22 ENCOUNTER — Ambulatory Visit (HOSPITAL_BASED_OUTPATIENT_CLINIC_OR_DEPARTMENT_OTHER): Payer: Medicaid Other

## 2012-04-22 ENCOUNTER — Ambulatory Visit: Payer: Medicaid Other

## 2012-04-22 VITALS — BP 119/76 | HR 82 | Temp 98.1°F

## 2012-04-22 DIAGNOSIS — C9200 Acute myeloblastic leukemia, not having achieved remission: Secondary | ICD-10-CM

## 2012-04-22 DIAGNOSIS — C9240 Acute promyelocytic leukemia, not having achieved remission: Secondary | ICD-10-CM

## 2012-04-22 DIAGNOSIS — Z5111 Encounter for antineoplastic chemotherapy: Secondary | ICD-10-CM

## 2012-04-22 MED ORDER — SODIUM CHLORIDE 0.9 % IV SOLN
Freq: Once | INTRAVENOUS | Status: AC
  Administered 2012-04-22: 09:00:00 via INTRAVENOUS

## 2012-04-22 MED ORDER — DEXAMETHASONE SODIUM PHOSPHATE 10 MG/ML IJ SOLN
10.0000 mg | Freq: Once | INTRAMUSCULAR | Status: AC
  Administered 2012-04-22: 10 mg via INTRAVENOUS

## 2012-04-22 MED ORDER — ONDANSETRON 8 MG/50ML IVPB (CHCC)
8.0000 mg | Freq: Once | INTRAVENOUS | Status: AC
  Administered 2012-04-22: 8 mg via INTRAVENOUS

## 2012-04-22 MED ORDER — SODIUM CHLORIDE 0.9 % IV SOLN
0.1460 mg/kg/d | Freq: Every day | INTRAVENOUS | Status: DC
  Administered 2012-04-22: 12 mg via INTRAVENOUS
  Filled 2012-04-22: qty 12

## 2012-04-22 NOTE — Patient Instructions (Signed)
[facility] Discharge Instructions for Patients Receiving Chemotherapy  Today you received the following chemotherapy agents arsenic  To help prevent nausea and vomiting after your treatment, we encourage you to take your nausea medication if needed Begin taking it at 3 pm and take it as often as prescribed for the next 48 hours if needed.   If you develop nausea and vomiting that is not controlled by your nausea medication, call the clinic. If it is after clinic hours your family physician or the after hours number for the clinic or go to the Emergency Department.   BELOW ARE SYMPTOMS THAT SHOULD BE REPORTED IMMEDIATELY:  *FEVER GREATER THAN 100.5 F  *CHILLS WITH OR WITHOUT FEVER  NAUSEA AND VOMITING THAT IS NOT CONTROLLED WITH YOUR NAUSEA MEDICATION  *UNUSUAL SHORTNESS OF BREATH  *UNUSUAL BRUISING OR BLEEDING  TENDERNESS IN MOUTH AND THROAT WITH OR WITHOUT PRESENCE OF ULCERS  *URINARY PROBLEMS  *BOWEL PROBLEMS  UNUSUAL RASH Items with * indicate a potential emergency and should be followed up as soon as possible.  One of the nurses will contact you 24 hours after your treatment. Please let the nurse know about any problems that you may have experienced. Feel free to call the clinic you have any questions or concerns. The clinic phone number is [phone].   I have been informed and understand all the instructions given to me. I know to contact the clinic, my physician, or go to the Emergency Department if any problems should occur. I do not have any questions at this time, but understand that I may call the clinic during office hours   should I have any questions or need assistance in obtaining follow up care.    __________________________________________  _____________  __________ Signature of Patient or Authorized Representative            Date                   Time    __________________________________________ Nurse's Signature

## 2012-04-22 NOTE — Consult Note (Signed)
Okay to proceed with Arsenic without magnesium/potassium levels on 8/1 and 8/2 per Dr. Pierce Crane

## 2012-04-25 ENCOUNTER — Encounter: Payer: Self-pay | Admitting: Family

## 2012-04-25 ENCOUNTER — Other Ambulatory Visit (HOSPITAL_BASED_OUTPATIENT_CLINIC_OR_DEPARTMENT_OTHER): Payer: Medicaid Other | Admitting: Lab

## 2012-04-25 ENCOUNTER — Ambulatory Visit (HOSPITAL_BASED_OUTPATIENT_CLINIC_OR_DEPARTMENT_OTHER): Payer: Medicaid Other | Admitting: Family

## 2012-04-25 ENCOUNTER — Ambulatory Visit (HOSPITAL_BASED_OUTPATIENT_CLINIC_OR_DEPARTMENT_OTHER): Payer: Medicaid Other

## 2012-04-25 VITALS — BP 117/82 | HR 85 | Temp 97.1°F | Resp 20

## 2012-04-25 VITALS — BP 114/77 | HR 99 | Temp 98.3°F | Resp 20 | Ht 61.0 in | Wt 273.4 lb

## 2012-04-25 DIAGNOSIS — C9200 Acute myeloblastic leukemia, not having achieved remission: Secondary | ICD-10-CM

## 2012-04-25 DIAGNOSIS — C9240 Acute promyelocytic leukemia, not having achieved remission: Secondary | ICD-10-CM

## 2012-04-25 DIAGNOSIS — M549 Dorsalgia, unspecified: Secondary | ICD-10-CM

## 2012-04-25 DIAGNOSIS — M25559 Pain in unspecified hip: Secondary | ICD-10-CM

## 2012-04-25 DIAGNOSIS — Z5111 Encounter for antineoplastic chemotherapy: Secondary | ICD-10-CM

## 2012-04-25 DIAGNOSIS — E119 Type 2 diabetes mellitus without complications: Secondary | ICD-10-CM

## 2012-04-25 LAB — COMPREHENSIVE METABOLIC PANEL
Albumin: 3.7 g/dL (ref 3.5–5.2)
Alkaline Phosphatase: 81 U/L (ref 39–117)
BUN: 16 mg/dL (ref 6–23)
CO2: 22 mEq/L (ref 19–32)
Calcium: 9.4 mg/dL (ref 8.4–10.5)
Chloride: 101 mEq/L (ref 96–112)
Glucose, Bld: 334 mg/dL — ABNORMAL HIGH (ref 70–99)
Potassium: 3.7 mEq/L (ref 3.5–5.3)
Sodium: 135 mEq/L (ref 135–145)
Total Protein: 6.5 g/dL (ref 6.0–8.3)

## 2012-04-25 LAB — CBC WITH DIFFERENTIAL/PLATELET
BASO%: 0.5 % (ref 0.0–2.0)
LYMPH%: 24.4 % (ref 14.0–49.7)
MCHC: 32.4 g/dL (ref 31.5–36.0)
MCV: 90.2 fL (ref 79.5–101.0)
MONO#: 0.6 10*3/uL (ref 0.1–0.9)
MONO%: 6.3 % (ref 0.0–14.0)
NEUT#: 7 10*3/uL — ABNORMAL HIGH (ref 1.5–6.5)
Platelets: 287 10*3/uL (ref 145–400)
RBC: 3.88 10*6/uL (ref 3.70–5.45)
RDW: 16.3 % — ABNORMAL HIGH (ref 11.2–14.5)
WBC: 10.3 10*3/uL (ref 3.9–10.3)

## 2012-04-25 MED ORDER — SODIUM CHLORIDE 0.9 % IV SOLN
0.1460 mg/kg/d | Freq: Every day | INTRAVENOUS | Status: DC
  Administered 2012-04-25: 12 mg via INTRAVENOUS
  Filled 2012-04-25: qty 12

## 2012-04-25 MED ORDER — OXYCODONE HCL 20 MG PO TABS: 1.0000 | ORAL_TABLET | ORAL | Status: DC | PRN

## 2012-04-25 MED ORDER — DEXAMETHASONE SODIUM PHOSPHATE 10 MG/ML IJ SOLN
10.0000 mg | Freq: Once | INTRAMUSCULAR | Status: AC
  Administered 2012-04-25: 10 mg via INTRAVENOUS

## 2012-04-25 MED ORDER — ONDANSETRON 8 MG/50ML IVPB (CHCC)
8.0000 mg | Freq: Once | INTRAVENOUS | Status: AC
  Administered 2012-04-25: 8 mg via INTRAVENOUS

## 2012-04-25 MED ORDER — SODIUM CHLORIDE 0.9 % IV SOLN
Freq: Once | INTRAVENOUS | Status: AC
  Administered 2012-04-25: 15:00:00 via INTRAVENOUS

## 2012-04-25 NOTE — Patient Instructions (Signed)
Treatment today with arsenic. Continue ATRA as scheduled.  2. Refill Oxycodone, will change to 20 mg tabs, 1 every 2-3 hours as needed for pain.  3. Check on last dose of Depo-Provera. May need injection at next visit.

## 2012-04-25 NOTE — Progress Notes (Signed)
Hematology and Oncology Follow Up Visit  Melissa Washington [id number] 30-Nov-1985 25 y.o. 04/12/12   HPI: 1. 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  Due to initiate day 8, cycle 3 of arsenic trioxide given on days 1-5, 8-12, 15-19, 22-26 every 2 months on 04/18/12.  Oral ATRA per protocol 50mg  orally twice a day for 2 weeks on, two weeks off, started today, 04/25/12.   2. Diabetes mellitus type 2, poorly controlled.   3. History cholecystitis/laparoscopic cholecystectomy  4. History of Klebsiella bacteremia  5. History of hypomagnesemia  Interim History:   Melissa Washington is seen today in follow up, chief complaint is pain associated with ATRA. Pain is bilateral hips and back. Was given prescription for Claritin on last visit, she tells me there is no appreciable difference with Claritin. Has prescription for  Oxycodone 10 mg, has been taking 2 every 2-3 hours to control the pain. Pain seems to improve when she stops taking the ATRA.. She denies nausea, emesis, diarrhea, or constipation issues.  Had  follow up at [facility] 04/22/12, she tells me they informed her this may be the last round of Arsenic. The [facility] physician is contacting Dr Donnie Coffin to discuss. Primary care doctor has increased her Lantus, blood sugars are still not well controlled. She takes 40 units a day. She will monitor her blood sugars and report back to her primary care Dr. No headache or blurred vision. No cough or shortness of breath. No abdominal pain. Bowel and bladder function are normal. Appetite is good, with adequate fluid intake. Remainder of the 10 point  review of systems is negative.  Medications:   I have reviewed the patient's current medications.  Current Outpatient Prescriptions  Medication Sig Dispense Refill  . glucose blood (ACCU-CHEK ACTIVE STRIPS) test strip Use as instructed  100 each  12  . insulin glargine (LANTUS) 100 UNIT/ML injection Inject 35 Units into the skin daily.  1 vial  12    . Insulin Syringe-Needle U-100 26G X 1/2" 1 ML MISC 35 Units by Does not apply route AC breakfast.  100 each  6  . lisinopril (PRINIVIL,ZESTRIL) 10 MG tablet Take 1 tablet (10 mg total) by mouth daily.  30 tablet  4  . loratadine (CLARITIN) 10 MG tablet Take 10 mg by mouth daily. 1 po daily, #30 04/12/12-CTS      . tretinoin (VESANOID) 10 MG capsule Take 50 mg by mouth 2 (two) times daily.      Marland Kitchen LORazepam (ATIVAN) 0.5 MG tablet Take 1 tablet (0.5 mg total) by mouth every 6 (six) hours as needed (Nausea or vomiting).  30 tablet  0  . magnesium oxide (MAG-OX) 400 (241.3 MG) MG tablet Take 1 tablet (400 mg total) by mouth daily.  1 tablet  1  . medroxyPROGESTERone (DEPO-PROVERA) 150 MG/ML injection Inject 1 mL (150 mg total) into the muscle once.  1 mL  3  . metFORMIN (GLUCOPHAGE) 1000 MG tablet Take 1 tablet (1,000 mg total) by mouth 2 (two) times daily with a meal.  60 tablet  11  . MICONAZOLE NITRATE VAGINAL (MONISTAT 3) 4 % CREA Place 1 application vaginally as directed.  1 Tube  0  . Oxycodone HCl 20 MG TABS Take 1 tablet (20 mg total) by mouth every 2 (two) hours as needed.  80 tablet  0      Allergies:  Allergies  Allergen Reactions  . Ultram (Tramadol Hcl) Nausea And Vomiting    Physical Exam:  Filed Vitals:   04/25/12 1312  BP: 114/77  Pulse: 99  Temp: 98.3 F (36.8 C)  Resp: 20    Body mass index is 51.66 kg/(m^2). Weight: 271 lbs. HEENT:  Sclerae anicteric, conjunctivae pink.  Oropharynx clear.  No mucositis or candidiasis.   Nodes:  No cervical, supraclavicular, or axillary lymphadenopathy palpated.   Lungs:  Clear to auscultation bilaterally.  No crackles, rhonchi, or wheezes.   Heart:  Regular rate and rhythm. Her left anterior chest wall PORT site has healed, no evidence of erythema. Abdomen:  Soft, nontender.  Positive bowel sounds.  No organomegaly or masses palpated.   Musculoskeletal:  Mild pain with gentle palpation over the thoracic and lumbar spine region.  Her  right hip, upper later thigh region was examined, no evidence of deformity or bruising noted. No evidence of trauma.  Extremities:  Benign.  No peripheral edema or cyanosis.   Skin:  Benign.   Neuro:  Nonfocal, alert and oriented x 3   Lab Results: Lab Results  Component Value Date   WBC 10.3 04/25/2012   HGB 11.4* 04/25/2012   HCT 35.0 04/25/2012   MCV 90.2 04/25/2012   PLT 287 04/25/2012   NEUTROABS 7.0* 04/25/2012     Chemistry      Component Value Date/Time   NA 135 04/25/2012 1220   K 3.7 04/25/2012 1220   CL 101 04/25/2012 1220   CO2 22 04/25/2012 1220   BUN 16 04/25/2012 1220   CREATININE 0.50 04/25/2012 1220   GLU 479* 03/01/2012 1210      Component Value Date/Time   CALCIUM 9.4 04/25/2012 1220   ALKPHOS 81 04/25/2012 1220   AST 10 04/25/2012 1220   ALT 10 04/25/2012 1220   BILITOT 0.5 04/25/2012 1220       Assessment:  A 26 year old [place] 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  Due to initiate day 8, cycle 3 of arsenic trioxide given on days 1-5, 8-12, 15-19, 22-26 every 2 months..  Oral ATRA per protocol 50mg  orally twice a day for 2 weeks on, two weeks off, started cycle today, 04/25/12. Good tolerance.  2. Diabetes mellitus type 2, poorly controlled.  3. Pain, right hip and back. Well controlled with Oxycodone 20 mg every 2-3 hours.  4. Urinary frequency reported on last visit, resolved.  5. Ending completion of treatment plan, per [facility]  Plan:  1. Treatment today with arsenic. Continue ATRA as scheduled.  2. Refill Oxycodone, will change to 20 mg tabs, 1 every 2-3 hours as needed for pain.  3. Check on last dose of Depo-Provera. May need injection at next visit.    Colman Cater, PA-C 04/12/12       Hematology and Oncology Follow Up Visit  Melissa Washington [id number] 02-Aug-1986 25 y.o. 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  Due to initiate day 1, cycle 3 of arsenic trioxide given on days 1-5, 8-12, 15-19,  22-26 every 2 months on 04/18/12.  Oral ATRA per protocol 50mg  orally twice a day for 2 weeks on, two weeks off, completed on 04/11/12.   2. Diabetes mellitus type 2, with HbgA1c reported at 10.5  3. Recent cholecystitis/laparoscopic cholecystectomy  4. History of Klebsiella bacteremia  5. History of hypomagnesemia, magnesium level normal today.  6. Reported vaginal candidosis, resolved.    Interim History:   Melissa Washington is seen today in follow up.  She is complaining of continued shoulder, back, and right hip pain.  Percocet 5/325, 2 every 6 hours has not provided much relief.  She has also tried ibuprofen 800mg  without improvement.   Upon review with pharmacy, ATRA causes bone pain in 77% of those taking it. She denies fevers, chills, or night sweats. No shortness of breath or chest pain. No bleeding or bruising symptoms. Energy level down a bit. She is not experiencing any unexplained weight loss, no drenching night sweats. She denies nausea, emesis, diarrhea, or constipation issues.  She is scheduled for follow up at [facility] 04/22/12. A detailed review of systems is otherwise noncontributory as noted below.  Review of Systems: Constitutional:  no weight loss, fever, night sweats and feels well Eyes: no complaints ENT: no complaints Cardiovascular: no chest pain or dyspnea on exertion Respiratory: no cough, shortness of breath, or wheezing Neurological: no TIA or stroke symptoms Dermatological: negative Gastrointestinal: no abdominal pain, change in bowel habits, or black or bloody stools Genito-Urinary: no dysuria, trouble voiding, or hematuria Hematological and Lymphatic: negative Breast: negative Musculoskeletal: negative Remaining ROS negative.  Medications:   I have reviewed the patient's current medications.  Current Outpatient Prescriptions  Medication Sig Dispense Refill  . glucose blood (ACCU-CHEK ACTIVE STRIPS) test strip Use as instructed  100 each  12  . insulin glargine  (LANTUS) 100 UNIT/ML injection Inject 35 Units into the skin daily.  1 vial  12  . Insulin Syringe-Needle U-100 26G X 1/2" 1 ML MISC 35 Units by Does not apply route AC breakfast.  100 each  6  . lisinopril (PRINIVIL,ZESTRIL) 10 MG tablet Take 1 tablet (10 mg total) by mouth daily.  30 tablet  4  . loratadine (CLARITIN) 10 MG tablet Take 10 mg by mouth daily. 1 po daily, #30 04/12/12-CTS      . tretinoin (VESANOID) 10 MG capsule Take 50 mg by mouth 2 (two) times daily.      Marland Kitchen LORazepam (ATIVAN) 0.5 MG tablet Take 1 tablet (0.5 mg total) by mouth every 6 (six) hours as needed (Nausea or vomiting).  30 tablet  0  . magnesium oxide (MAG-OX) 400 (241.3 MG) MG tablet Take 1 tablet (400 mg total) by mouth daily.  1 tablet  1  . medroxyPROGESTERone (DEPO-PROVERA) 150 MG/ML injection Inject 1 mL (150 mg total) into the muscle once.  1 mL  3  . metFORMIN (GLUCOPHAGE) 1000 MG tablet Take 1 tablet (1,000 mg total) by mouth 2 (two) times daily with a meal.  60 tablet  11  . MICONAZOLE NITRATE VAGINAL (MONISTAT 3) 4 % CREA Place 1 application vaginally as directed.  1 Tube  0  . Oxycodone HCl 20 MG TABS Take 1 tablet (20 mg total) by mouth every 2 (two) hours as needed.  80 tablet  0   No current facility-administered medications for this visit.   Facility-Administered Medications Ordered in Other Visits  Medication Dose Route Frequency Provider Last Rate Last Dose  . 0.9 %  sodium chloride infusion   Intravenous Once Pierce Crane, MD 20 mL/hr at 04/25/12 1454    . arsenic trioxide (TRISENOX) 12 mg in sodium chloride 0.9 % 250 mL chemo Infusion  0.146 mg/kg/day (Order-Specific) Intravenous Daily Pierce Crane, MD   12 mg at 03/15/12 1447  . arsenic trioxide (TRISENOX) 12 mg in sodium chloride 0.9 % 250 mL chemo Infusion  0.146 mg/kg/day (Order-Specific) Intravenous Daily Pierce Crane, MD 131 mL/hr at 04/19/12 1437 12 mg at 04/19/12 1437  . arsenic trioxide (TRISENOX) 12 mg in sodium chloride 0.9 %  250 mL chemo  Infusion  0.146 mg/kg/day (Order-Specific) Intravenous Daily Pierce Crane, MD 131 mL/hr at 04/25/12 1518 12 mg at 04/25/12 1518  . dexamethasone (DECADRON) injection 10 mg  10 mg Intravenous Once Pierce Crane, MD   10 mg at 04/25/12 1500  . ondansetron (ZOFRAN) IVPB 8 mg  8 mg Intravenous Once Pierce Crane, MD   8 mg at 04/25/12 1500  . sodium chloride 0.9 % injection 10 mL  10 mL Intracatheter PRN Pierce Crane, MD   10 mL at 03/15/12 1706  . sodium chloride 0.9 % injection 10 mL  10 mL Intracatheter PRN Pierce Crane, MD        Allergies:  Allergies  Allergen Reactions  . Ultram (Tramadol Hcl) Nausea And Vomiting    Physical Exam: Filed Vitals:   04/25/12 1312  BP: 114/77  Pulse: 99  Temp: 98.3 F (36.8 C)  Resp: 20    Body mass index is 51.66 kg/(m^2). Weight: 271 lbs. HEENT:  Sclerae anicteric, conjunctivae pink.  Oropharynx clear.  No mucositis or candidiasis.   Nodes:  No cervical, supraclavicular, or axillary lymphadenopathy palpated.   Lungs:  Clear to auscultation bilaterally.  No crackles, rhonchi, or wheezes.   Heart:  Regular rate and rhythm. Her left anterior chest wall PORT site has healed, no evidence of erythema. Abdomen:  Soft, nontender.  Positive bowel sounds.  No organomegaly or masses palpated.   Musculoskeletal:  She denies any frank evidence of discomfort with gentle palpation over her thoracic cervical and lumbar spine region.  Her right hip, upper later thigh region was examined, no evidence of deformity or bruising noted. Extremities:  Benign.  No peripheral edema or cyanosis.   Skin:  Benign.   Neuro:  Nonfocal, alert and oriented x 3   Lab Results: Lab Results  Component Value Date   WBC 10.3 04/25/2012   HGB 11.4* 04/25/2012   HCT 35.0 04/25/2012   MCV 90.2 04/25/2012   PLT 287 04/25/2012   NEUTROABS 7.0* 04/25/2012     Chemistry      Component Value Date/Time   NA 135 04/25/2012 1220   K 3.7 04/25/2012 1220   CL 101 04/25/2012 1220   CO2 22 04/25/2012 1220   BUN  16 04/25/2012 1220   CREATININE 0.50 04/25/2012 1220   GLU 479* 03/01/2012 1210      Component Value Date/Time   CALCIUM 9.4 04/25/2012 1220   ALKPHOS 81 04/25/2012 1220   AST 10 04/25/2012 1220   ALT 10 04/25/2012 1220   BILITOT 0.5 04/25/2012 1220      No results found for this basename: LABCA2     12 lead EKG: Obtained 03/30/2012--Evidence of mild QT interval elongation. 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  Due to initiate day 1, cycle 3 of arsenic trioxide given on days 1-5, 8-12, 15-19, 22-26 every 2 months on 04/18/12.  Oral ATRA per protocol 50mg  orally twice a day for 2 weeks on, two weeks off, completed on 04/11/12.   2. Diabetes mellitus type 2, with HbgA1c reported at 10.5  3. Recent cholecystitis/laparoscopic cholecystectomy  4. History of Klebsiella bacteremia  5. History of hypomagnesemia, magnesium level normal today.  6. Reported vaginal candidosis, resolved.  7. Bone pain lower pain/right hip  8. Urinary frequency  This case reviewed with Dr. Pierce Crane  Plan:  Melissa Washington will submit a urine for UA and culture today.     She will monitor  for fevers, instructed not to hesitate contacting us if her symptomatology should worsen. This plan was reviewed with the patient, who voi EKG done today shows a normal QTc interval. Her potassium is low and she has problems taking the larger pills and so I have instructed her to break apart and put him into applesauce. We do have a recurrent cell phone number which is [phone]. She has an  understanding and agreement.  She knows to call with any changes or problems.   Colman Cater, FNP 04/12/12

## 2012-04-26 ENCOUNTER — Other Ambulatory Visit: Payer: Medicaid Other | Admitting: Lab

## 2012-04-26 ENCOUNTER — Ambulatory Visit (HOSPITAL_BASED_OUTPATIENT_CLINIC_OR_DEPARTMENT_OTHER): Payer: Medicaid Other

## 2012-04-26 ENCOUNTER — Ambulatory Visit: Payer: Medicaid Other | Admitting: Oncology

## 2012-04-26 ENCOUNTER — Other Ambulatory Visit: Payer: Self-pay | Admitting: Radiology

## 2012-04-26 DIAGNOSIS — C9200 Acute myeloblastic leukemia, not having achieved remission: Secondary | ICD-10-CM

## 2012-04-26 DIAGNOSIS — C9240 Acute promyelocytic leukemia, not having achieved remission: Secondary | ICD-10-CM

## 2012-04-26 DIAGNOSIS — Z5111 Encounter for antineoplastic chemotherapy: Secondary | ICD-10-CM

## 2012-04-26 MED ORDER — ONDANSETRON 8 MG/50ML IVPB (CHCC)
8.0000 mg | Freq: Once | INTRAVENOUS | Status: AC
  Administered 2012-04-26: 8 mg via INTRAVENOUS

## 2012-04-26 MED ORDER — DEXAMETHASONE SODIUM PHOSPHATE 10 MG/ML IJ SOLN
10.0000 mg | Freq: Once | INTRAMUSCULAR | Status: AC
  Administered 2012-04-26: 10 mg via INTRAVENOUS

## 2012-04-26 MED ORDER — SODIUM CHLORIDE 0.9 % IV SOLN
0.1460 mg/kg/d | Freq: Every day | INTRAVENOUS | Status: DC
  Administered 2012-04-26: 12 mg via INTRAVENOUS
  Filled 2012-04-26: qty 12

## 2012-04-26 MED ORDER — SODIUM CHLORIDE 0.9 % IV SOLN: Freq: Once | INTRAVENOUS | Status: DC

## 2012-04-26 NOTE — Patient Instructions (Addendum)
[facility] Discharge Instructions for Patients Receiving Chemotherapy  Today you received the following chemotherapy agents arsnic  To help prevent nausea and vomiting after your treatment, we encourage you to take your nausea medication  and take it as often as prescribed   If you develop nausea and vomiting that is not controlled by your nausea medication, call the clinic. If it is after clinic hours your family physician or the after hours number for the clinic or go to the Emergency Department.   BELOW ARE SYMPTOMS THAT SHOULD BE REPORTED IMMEDIATELY:  *FEVER GREATER THAN 100.5 F  *CHILLS WITH OR WITHOUT FEVER  NAUSEA AND VOMITING THAT IS NOT CONTROLLED WITH YOUR NAUSEA MEDICATION  *UNUSUAL SHORTNESS OF BREATH  *UNUSUAL BRUISING OR BLEEDING  TENDERNESS IN MOUTH AND THROAT WITH OR WITHOUT PRESENCE OF ULCERS  *URINARY PROBLEMS  *BOWEL PROBLEMS  UNUSUAL RASH Items with * indicate a potential emergency and should be followed up as soon as possible.  One of the nurses will contact you 24 hours after your treatment. Please let the nurse know about any problems that you may have experienced. Feel free to call the clinic you have any questions or concerns. The clinic phone number is [phone].   I have been informed and understand all the instructions given to me. I know to contact the clinic, my physician, or go to the Emergency Department if any problems should occur. I do not have any questions at this time, but understand that I may call the clinic during office hours   should I have any questions or need assistance in obtaining follow up care.    __________________________________________  _____________  __________ Signature of Patient or Authorized Representative            Date                   Time    __________________________________________ Nurse's Signature

## 2012-04-27 ENCOUNTER — Ambulatory Visit (HOSPITAL_BASED_OUTPATIENT_CLINIC_OR_DEPARTMENT_OTHER): Payer: Medicaid Other

## 2012-04-27 ENCOUNTER — Other Ambulatory Visit: Payer: Self-pay | Admitting: Family

## 2012-04-27 DIAGNOSIS — C9200 Acute myeloblastic leukemia, not having achieved remission: Secondary | ICD-10-CM

## 2012-04-27 DIAGNOSIS — C9240 Acute promyelocytic leukemia, not having achieved remission: Secondary | ICD-10-CM

## 2012-04-27 DIAGNOSIS — Z5111 Encounter for antineoplastic chemotherapy: Secondary | ICD-10-CM

## 2012-04-27 MED ORDER — ONDANSETRON 8 MG/50ML IVPB (CHCC)
8.0000 mg | Freq: Once | INTRAVENOUS | Status: AC
  Administered 2012-04-27: 8 mg via INTRAVENOUS

## 2012-04-27 MED ORDER — SODIUM CHLORIDE 0.9 % IV SOLN: Freq: Once | INTRAVENOUS | Status: DC

## 2012-04-27 MED ORDER — DEXAMETHASONE SODIUM PHOSPHATE 10 MG/ML IJ SOLN
10.0000 mg | Freq: Once | INTRAMUSCULAR | Status: AC
  Administered 2012-04-27: 10 mg via INTRAVENOUS

## 2012-04-27 MED ORDER — SODIUM CHLORIDE 0.9 % IV SOLN
0.1460 mg/kg/d | Freq: Every day | INTRAVENOUS | Status: DC
  Administered 2012-04-27: 12 mg via INTRAVENOUS
  Filled 2012-04-27: qty 12

## 2012-04-28 ENCOUNTER — Encounter (HOSPITAL_COMMUNITY): Payer: Self-pay

## 2012-04-28 ENCOUNTER — Ambulatory Visit: Payer: Medicaid Other

## 2012-04-28 ENCOUNTER — Ambulatory Visit (HOSPITAL_BASED_OUTPATIENT_CLINIC_OR_DEPARTMENT_OTHER): Payer: Medicaid Other

## 2012-04-28 ENCOUNTER — Ambulatory Visit (HOSPITAL_COMMUNITY)
Admission: RE | Admit: 2012-04-28 | Discharge: 2012-04-28 | Disposition: A | Discharge status: Home / Self Care | Payer: Medicaid Other | Source: Ambulatory Visit | Attending: Oncology | Admitting: Oncology

## 2012-04-28 ENCOUNTER — Other Ambulatory Visit: Payer: Medicaid Other

## 2012-04-28 ENCOUNTER — Other Ambulatory Visit: Payer: Self-pay | Admitting: Oncology

## 2012-04-28 ENCOUNTER — Other Ambulatory Visit: Payer: Self-pay | Admitting: *Deleted

## 2012-04-28 VITALS — BP 128/75 | HR 84 | Temp 97.9°F | Resp 16

## 2012-04-28 VITALS — BP 132/90 | HR 86 | Temp 98.0°F

## 2012-04-28 DIAGNOSIS — C9240 Acute promyelocytic leukemia, not having achieved remission: Secondary | ICD-10-CM

## 2012-04-28 DIAGNOSIS — C9200 Acute myeloblastic leukemia, not having achieved remission: Secondary | ICD-10-CM

## 2012-04-28 DIAGNOSIS — Z794 Long term (current) use of insulin: Secondary | ICD-10-CM | POA: Insufficient documentation

## 2012-04-28 DIAGNOSIS — I10 Essential (primary) hypertension: Secondary | ICD-10-CM | POA: Insufficient documentation

## 2012-04-28 DIAGNOSIS — Z79899 Other long term (current) drug therapy: Secondary | ICD-10-CM | POA: Insufficient documentation

## 2012-04-28 DIAGNOSIS — Z5111 Encounter for antineoplastic chemotherapy: Secondary | ICD-10-CM

## 2012-04-28 DIAGNOSIS — K7689 Other specified diseases of liver: Secondary | ICD-10-CM | POA: Insufficient documentation

## 2012-04-28 DIAGNOSIS — Z452 Encounter for adjustment and management of vascular access device: Secondary | ICD-10-CM | POA: Insufficient documentation

## 2012-04-28 DIAGNOSIS — E119 Type 2 diabetes mellitus without complications: Secondary | ICD-10-CM | POA: Insufficient documentation

## 2012-04-28 LAB — CBC WITH DIFFERENTIAL/PLATELET
Basophils Absolute: 0 10*3/uL (ref 0.0–0.1)
Basophils Relative: 0 % (ref 0–1)
HCT: 34.6 % — ABNORMAL LOW (ref 36.0–46.0)
Lymphocytes Relative: 14 % (ref 12–46)
MCHC: 34.4 g/dL (ref 30.0–36.0)
Monocytes Absolute: 1.2 10*3/uL — ABNORMAL HIGH (ref 0.1–1.0)
Neutro Abs: 10.1 10*3/uL — ABNORMAL HIGH (ref 1.7–7.7)
Neutrophils Relative %: 77 % (ref 43–77)
Platelets: 332 10*3/uL (ref 150–400)
RDW: 15.8 % — ABNORMAL HIGH (ref 11.5–15.5)
WBC: 13.1 10*3/uL — ABNORMAL HIGH (ref 4.0–10.5)

## 2012-04-28 LAB — BASIC METABOLIC PANEL
BUN: 26 mg/dL — ABNORMAL HIGH (ref 6–23)
Calcium: 9.8 mg/dL (ref 8.4–10.5)
Creatinine, Ser: 0.66 mg/dL (ref 0.50–1.10)
GFR calc Af Amer: >90 mL/min (ref >90)
GFR calc non Af Amer: >90 mL/min (ref >90)

## 2012-04-28 LAB — HEPATIC FUNCTION PANEL
Alkaline Phosphatase: 76 U/L (ref 39–117)
Indirect Bilirubin: 0.5 mg/dL (ref 0.3–0.9)
Total Bilirubin: 0.6 mg/dL (ref 0.3–1.2)
Total Protein: 7.2 g/dL (ref 6.0–8.3)

## 2012-04-28 LAB — MAGNESIUM: Magnesium: 2.1 mg/dL (ref 1.5–2.5)

## 2012-04-28 LAB — GLUCOSE, CAPILLARY: Glucose-Capillary: 326 mg/dL — ABNORMAL HIGH (ref 70–99)

## 2012-04-28 LAB — PROTIME-INR: Prothrombin Time: 13.5 seconds (ref 11.6–15.2)

## 2012-04-28 LAB — APTT: aPTT: 25 seconds (ref 24–37)

## 2012-04-28 MED ORDER — MIDAZOLAM HCL 2 MG/2ML IJ SOLN
INTRAMUSCULAR | Status: AC
  Filled 2012-04-28: qty 4

## 2012-04-28 MED ORDER — MIDAZOLAM HCL 2 MG/2ML IJ SOLN
INTRAMUSCULAR | Status: AC
  Filled 2012-04-28: qty 2

## 2012-04-28 MED ORDER — FENTANYL CITRATE 0.05 MG/ML IJ SOLN
INTRAMUSCULAR | Status: AC
  Filled 2012-04-28: qty 4

## 2012-04-28 MED ORDER — ONDANSETRON 8 MG/50ML IVPB (CHCC)
8.0000 mg | Freq: Once | INTRAVENOUS | Status: AC
  Administered 2012-04-28: 8 mg via INTRAVENOUS

## 2012-04-28 MED ORDER — DEXAMETHASONE SODIUM PHOSPHATE 10 MG/ML IJ SOLN
10.0000 mg | Freq: Once | INTRAMUSCULAR | Status: AC
  Administered 2012-04-28: 10 mg via INTRAVENOUS

## 2012-04-28 MED ORDER — SODIUM CHLORIDE 0.9 % IV SOLN: Freq: Once | INTRAVENOUS | Status: DC

## 2012-04-28 MED ORDER — FENTANYL CITRATE 0.05 MG/ML IJ SOLN
INTRAMUSCULAR | Status: AC
  Filled 2012-04-28: qty 2

## 2012-04-28 MED ORDER — INSULIN ASPART 100 UNIT/ML ~~LOC~~ SOLN
9.0000 [IU] | Freq: Once | SUBCUTANEOUS | Status: AC
  Administered 2012-04-28: 9 [IU] via SUBCUTANEOUS
  Filled 2012-04-28: qty 1

## 2012-04-28 MED ORDER — CEFAZOLIN SODIUM 1-5 GM-% IV SOLN: 1.0000 g | Freq: Once | INTRAVENOUS | Status: DC

## 2012-04-28 MED ORDER — MIDAZOLAM HCL 5 MG/5ML IJ SOLN
INTRAMUSCULAR | Status: AC | PRN
  Administered 2012-04-28 (×2): 2 mg via INTRAVENOUS

## 2012-04-28 MED ORDER — SODIUM CHLORIDE 0.9 % IV SOLN
Freq: Once | INTRAVENOUS | Status: AC
  Administered 2012-04-28: 12:00:00 via INTRAVENOUS

## 2012-04-28 MED ORDER — CEFAZOLIN SODIUM-DEXTROSE 2-3 GM-% IV SOLR
2.0000 g | Freq: Once | INTRAVENOUS | Status: AC
  Administered 2012-04-28: 2 g via INTRAVENOUS
  Filled 2012-04-28: qty 50

## 2012-04-28 MED ORDER — FENTANYL CITRATE 0.05 MG/ML IJ SOLN
INTRAMUSCULAR | Status: AC | PRN
  Administered 2012-04-28 (×2): 100 ug via INTRAVENOUS

## 2012-04-28 MED ORDER — SODIUM CHLORIDE 0.9 % IV SOLN
0.1460 mg/kg/d | Freq: Every day | INTRAVENOUS | Status: DC
  Administered 2012-04-28: 12 mg via INTRAVENOUS
  Filled 2012-04-28: qty 12

## 2012-04-28 NOTE — H&P (Signed)
Chief Complaint: Leukemia Referring Physician:Rubin HPI: Melissa Washington is an 26 y.o. 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 She's had ports placed and removed due to bacteremia from gallbladder problems. She had a new (L)port placed and it has been functional until recently. Now it appears to have retracted developing a fibrin sheath at the tip. She had a line injection a week ago that confirmed this. She is scheduled today for removal of the left side port and placement of a new right sided port. No other changes to health history, meds, or allergies.  Past Medical History:  Past Medical History  Diagnosis Date  . Diabetes mellitus   . Hypertension   . Nephrolithiasis   . 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 Hepatic steatosis 01/17/2012  . Cholelithiasis 01/17/2012    Past Surgical History:  Past Surgical History  Procedure Date  . Portacath placement     [facility]  . Cholecystectomy 01/31/2012    Procedure: LAPAROSCOPIC CHOLECYSTECTOMY WITH INTRAOPERATIVE CHOLANGIOGRAM;  Surgeon: Clovis Pu. Cornett, MD;  Location: [facility] OR;  Service: General;  Laterality: N/A;    Family History:  Family History  Problem Relation Age of Onset  . Kidney disease Mother   . Kidney disease Maternal Grandmother   . Diabetes Maternal Grandmother   . Hypertension Mother   . Diabetes Father     Social History:  reports that she has never smoked. She does not have any smokeless tobacco history on file. She reports that she does not drink alcohol or use illicit drugs.  Allergies:  Allergies  Allergen Reactions  . Ultram (Tramadol Hcl) Nausea And Vomiting    Medications: Current Outpatient Prescriptions   Medication  Sig  Dispense  Refill   .  glucose blood (ACCU-CHEK ACTIVE STRIPS) test strip  Use as instructed  100 each  12   .  insulin glargine (LANTUS) 100 UNIT/ML injection  Inject 35 Units into the skin daily.  1 vial  12   .  Insulin Syringe-Needle U-100 26G X 1/2" 1  ML MISC  35 Units by Does not apply route AC breakfast.  100 each  6   .  lisinopril (PRINIVIL,ZESTRIL) 10 MG tablet  Take 1 tablet (10 mg total) by mouth daily.  30 tablet  4   .  loratadine (CLARITIN) 10 MG tablet  Take 10 mg by mouth daily. 1 po daily, #30 04/12/12-CTS     .  tretinoin (VESANOID) 10 MG capsule  Take 50 mg by mouth 2 (two) times daily.     Marland Kitchen  LORazepam (ATIVAN) 0.5 MG tablet  Take 1 tablet (0.5 mg total) by mouth every 6 (six) hours as needed (Nausea or vomiting).  30 tablet  0   .  magnesium oxide (MAG-OX) 400 (241.3 MG) MG tablet  Take 1 tablet (400 mg total) by mouth daily.  1 tablet  1   .  medroxyPROGESTERone (DEPO-PROVERA) 150 MG/ML injection  Inject 1 mL (150 mg total) into the muscle once.  1 mL  3   .  metFORMIN (GLUCOPHAGE) 1000 MG tablet  Take 1 tablet (1,000 mg total) by mouth 2 (two) times daily with a meal.  60 tablet  11   .  MICONAZOLE NITRATE VAGINAL (MONISTAT 3) 4 % CREA  Place 1 application vaginally as directed.  1 Tube  0   .  Oxycodone HCl 20 MG TABS  Take 1 tablet (20 mg total) by mouth every  2 (two) hours as needed.  80 tablet       Please HPI for pertinent positives, otherwise complete 10 system ROS negative.  Physical Exam: Blood pressure 121/71, pulse 72, temperature 97.9 F (36.6 C), temperature source Oral, resp. rate 18, SpO2 99.00%. There is no height or weight on file to calculate BMI.   General Appearance:  Alert, cooperative, no distress, appears stated age, morbidly obese  Head:  Normocephalic, without obvious abnormality, atraumatic  ENT: Unremarkable  Neck: Supple, symmetrical, trachea midline, no adenopathy, thyroid: not enlarged, symmetric, no tenderness/mass/nodules  Lungs:   Clear to auscultation bilaterally, no w/r/r, respirations unlabored without use of accessory muscles.  Chest Wall:  Palpable (L)port, NT. Scar from prior rt port well healed.  Heart:  Regular rate and rhythm, S1, S2 normal, no murmur, rub or gallop. Carotids 2+  without bruit.  Abdomen:   Soft, non-tender, non distended. Bowel sounds active all four quadrants,  no masses, no organomegaly.  Neurologic: Normal affect, no gross deficits.   No results found for this or any previous visit (from the past 48 hour(s)). No results found.  Assessment/Plan Acute Myelocytic Leukemia For removal of (L)port and placement of new (R)port Pt very familiar with procedure and risks. Labs pending Consent signed in chart.  Brayton El PA-C 04/28/2012, 8:01 AM

## 2012-04-28 NOTE — ED Notes (Signed)
Right PAC complete. Pt being prepped for Left PAC removal.

## 2012-04-28 NOTE — Procedures (Signed)
Successful placement of right IJ approach port-a-cath with tip at superior aspect of the right atrium. The catheter is ready for immediate use. Successful removal of left anterior chest wall port-a-cath. No immediate post procedural complications.   

## 2012-04-29 ENCOUNTER — Ambulatory Visit (HOSPITAL_BASED_OUTPATIENT_CLINIC_OR_DEPARTMENT_OTHER): Payer: Medicaid Other

## 2012-04-29 DIAGNOSIS — C9240 Acute promyelocytic leukemia, not having achieved remission: Secondary | ICD-10-CM

## 2012-04-29 DIAGNOSIS — Z5111 Encounter for antineoplastic chemotherapy: Secondary | ICD-10-CM

## 2012-04-29 DIAGNOSIS — C9200 Acute myeloblastic leukemia, not having achieved remission: Secondary | ICD-10-CM

## 2012-04-29 MED ORDER — DEXAMETHASONE SODIUM PHOSPHATE 10 MG/ML IJ SOLN
10.0000 mg | Freq: Once | INTRAMUSCULAR | Status: AC
  Administered 2012-04-29: 10 mg via INTRAVENOUS

## 2012-04-29 MED ORDER — ONDANSETRON 8 MG/50ML IVPB (CHCC)
8.0000 mg | Freq: Once | INTRAVENOUS | Status: AC
  Administered 2012-04-29: 8 mg via INTRAVENOUS

## 2012-04-29 MED ORDER — SODIUM CHLORIDE 0.9 % IV SOLN
Freq: Once | INTRAVENOUS | Status: AC
  Administered 2012-04-29: 12:00:00 via INTRAVENOUS

## 2012-04-29 MED ORDER — SODIUM CHLORIDE 0.9 % IV SOLN
0.1460 mg/kg/d | Freq: Every day | INTRAVENOUS | Status: DC
  Administered 2012-04-29: 12 mg via INTRAVENOUS
  Filled 2012-04-29: qty 12

## 2012-04-29 MED ORDER — SODIUM CHLORIDE 0.9 % IJ SOLN
10.0000 mL | INTRAMUSCULAR | Status: DC | PRN
  Administered 2012-04-29: 10 mL
  Filled 2012-04-29: qty 10

## 2012-04-29 MED ORDER — HEPARIN SOD (PORK) LOCK FLUSH 100 UNIT/ML IV SOLN
500.0000 [IU] | Freq: Once | INTRAVENOUS | Status: AC | PRN
  Administered 2012-04-29: 500 [IU]
  Filled 2012-04-29: qty 5

## 2012-04-29 NOTE — Patient Instructions (Addendum)
[facility] Discharge Instructions for Patients Receiving Chemotherapy  Today you received the following chemotherapy agents arsenic  To help prevent nausea and vomiting after your treatment, we encourage you to take your nausea medication  and take it as often as prescribed    If you develop nausea and vomiting that is not controlled by your nausea medication, call the clinic. If it is after clinic hours your family physician or the after hours number for the clinic or go to the Emergency Department.   BELOW ARE SYMPTOMS THAT SHOULD BE REPORTED IMMEDIATELY:  *FEVER GREATER THAN 100.5 F  *CHILLS WITH OR WITHOUT FEVER  NAUSEA AND VOMITING THAT IS NOT CONTROLLED WITH YOUR NAUSEA MEDICATION  *UNUSUAL SHORTNESS OF BREATH  *UNUSUAL BRUISING OR BLEEDING  TENDERNESS IN MOUTH AND THROAT WITH OR WITHOUT PRESENCE OF ULCERS  *URINARY PROBLEMS  *BOWEL PROBLEMS  UNUSUAL RASH Items with * indicate a potential emergency and should be followed up as soon as possible.  One of the nurses will contact you 24 hours after your treatment. Please let the nurse know about any problems that you may have experienced. Feel free to call the clinic you have any questions or concerns. The clinic phone number is [phone].   I have been informed and understand all the instructions given to me. I know to contact the clinic, my physician, or go to the Emergency Department if any problems should occur. I do not have any questions at this time, but understand that I may call the clinic during office hours   should I have any questions or need assistance in obtaining follow up care.    __________________________________________  _____________  __________ Signature of Patient or Authorized Representative            Date                   Time    __________________________________________ Nurse's Signature

## 2012-05-02 ENCOUNTER — Other Ambulatory Visit: Payer: Self-pay

## 2012-05-02 ENCOUNTER — Other Ambulatory Visit: Payer: Medicaid Other | Admitting: Lab

## 2012-05-02 ENCOUNTER — Other Ambulatory Visit (HOSPITAL_BASED_OUTPATIENT_CLINIC_OR_DEPARTMENT_OTHER): Payer: Medicaid Other | Admitting: Lab

## 2012-05-02 ENCOUNTER — Ambulatory Visit (HOSPITAL_BASED_OUTPATIENT_CLINIC_OR_DEPARTMENT_OTHER): Payer: Medicaid Other

## 2012-05-02 ENCOUNTER — Ambulatory Visit: Payer: Medicaid Other | Admitting: Oncology

## 2012-05-02 VITALS — BP 141/82 | HR 96 | Temp 97.6°F | Resp 20 | Ht 61.0 in | Wt 270.6 lb

## 2012-05-02 DIAGNOSIS — C9240 Acute promyelocytic leukemia, not having achieved remission: Secondary | ICD-10-CM

## 2012-05-02 DIAGNOSIS — C9200 Acute myeloblastic leukemia, not having achieved remission: Secondary | ICD-10-CM

## 2012-05-02 DIAGNOSIS — E119 Type 2 diabetes mellitus without complications: Secondary | ICD-10-CM

## 2012-05-02 DIAGNOSIS — Z5111 Encounter for antineoplastic chemotherapy: Secondary | ICD-10-CM

## 2012-05-02 LAB — CBC WITH DIFFERENTIAL/PLATELET
BASO%: 1.1 % (ref 0.0–2.0)
Basophils Absolute: 0.1 10*3/uL (ref 0.0–0.1)
HCT: 36.3 % (ref 34.8–46.6)
HGB: 11.9 g/dL (ref 11.6–15.9)
LYMPH%: 24.3 % (ref 14.0–49.7)
MCH: 29.9 pg (ref 25.1–34.0)
MCHC: 32.8 g/dL (ref 31.5–36.0)
MONO#: 0.5 10*3/uL (ref 0.1–0.9)
NEUT%: 69.2 % (ref 38.4–76.8)
Platelets: 270 10*3/uL (ref 145–400)
WBC: 11.5 10*3/uL — ABNORMAL HIGH (ref 3.9–10.3)
lymph#: 2.8 10*3/uL (ref 0.9–3.3)

## 2012-05-02 LAB — COMPREHENSIVE METABOLIC PANEL
ALT: 10 U/L (ref 0–35)
BUN: 15 mg/dL (ref 6–23)
CO2: 21 mEq/L (ref 19–32)
Calcium: 9.4 mg/dL (ref 8.4–10.5)
Chloride: 102 mEq/L (ref 96–112)
Creatinine, Ser: 0.5 mg/dL (ref 0.50–1.10)
Total Bilirubin: 0.5 mg/dL (ref 0.3–1.2)

## 2012-05-02 LAB — MAGNESIUM: Magnesium: 2 mg/dL (ref 1.5–2.5)

## 2012-05-02 MED ORDER — SODIUM CHLORIDE 0.9 % IJ SOLN
3.0000 mL | INTRAMUSCULAR | Status: DC | PRN
  Filled 2012-05-02: qty 10

## 2012-05-02 MED ORDER — ONDANSETRON 8 MG/50ML IVPB (CHCC)
8.0000 mg | Freq: Once | INTRAVENOUS | Status: AC
  Administered 2012-05-02: 8 mg via INTRAVENOUS

## 2012-05-02 MED ORDER — OXYCODONE HCL 20 MG PO TABS: 1.0000 | ORAL_TABLET | ORAL | Status: DC | PRN

## 2012-05-02 MED ORDER — SODIUM CHLORIDE 0.9 % IV SOLN
12.0000 mg | Freq: Every day | INTRAVENOUS | Status: DC
  Administered 2012-05-02: 12 mg via INTRAVENOUS
  Filled 2012-05-02: qty 12

## 2012-05-02 MED ORDER — DEXAMETHASONE SODIUM PHOSPHATE 10 MG/ML IJ SOLN
10.0000 mg | Freq: Once | INTRAMUSCULAR | Status: AC
  Administered 2012-05-02: 10 mg via INTRAVENOUS

## 2012-05-02 MED ORDER — SODIUM CHLORIDE 0.9 % IV SOLN
Freq: Once | INTRAVENOUS | Status: AC
  Administered 2012-05-02: 11:00:00 via INTRAVENOUS

## 2012-05-02 MED ORDER — HEPARIN SOD (PORK) LOCK FLUSH 100 UNIT/ML IV SOLN
250.0000 [IU] | Freq: Once | INTRAVENOUS | Status: DC | PRN
  Filled 2012-05-02: qty 5

## 2012-05-02 MED ORDER — ALTEPLASE 2 MG IJ SOLR
2.0000 mg | Freq: Once | INTRAMUSCULAR | Status: DC | PRN
  Filled 2012-05-02: qty 2

## 2012-05-02 MED ORDER — SODIUM CHLORIDE 0.9 % IJ SOLN
10.0000 mL | INTRAMUSCULAR | Status: DC | PRN
  Administered 2012-05-02: 10 mL
  Filled 2012-05-02: qty 10

## 2012-05-02 MED ORDER — INSULIN GLARGINE 100 UNIT/ML ~~LOC~~ SOLN: 40.0000 [IU] | Freq: Every day | SUBCUTANEOUS | Status: DC

## 2012-05-02 MED ORDER — HEPARIN SOD (PORK) LOCK FLUSH 100 UNIT/ML IV SOLN
500.0000 [IU] | Freq: Once | INTRAVENOUS | Status: AC | PRN
  Administered 2012-05-02: 500 [IU]
  Filled 2012-05-02: qty 5

## 2012-05-02 NOTE — Patient Instructions (Signed)
[facility] Discharge Instructions for Patients Receiving Chemotherapy  Today you received the following chemotherapy agents Arsenic  To help prevent nausea and vomiting after your treatment, we encourage you to take your nausea medication as directed by your MD. If you develop nausea and vomiting that is not controlled by your nausea medication, call the clinic. If it is after clinic hours your family physician or the after hours number for the clinic or go to the Emergency Department.   BELOW ARE SYMPTOMS THAT SHOULD BE REPORTED IMMEDIATELY:  *FEVER GREATER THAN 100.5 F  *CHILLS WITH OR WITHOUT FEVER  NAUSEA AND VOMITING THAT IS NOT CONTROLLED WITH YOUR NAUSEA MEDICATION  *UNUSUAL SHORTNESS OF BREATH  *UNUSUAL BRUISING OR BLEEDING  TENDERNESS IN MOUTH AND THROAT WITH OR WITHOUT PRESENCE OF ULCERS  *URINARY PROBLEMS  *BOWEL PROBLEMS  UNUSUAL RASH Items with * indicate a potential emergency and should be followed up as soon as possible.   Feel free to call the clinic you have any questions or concerns. The clinic phone number is [phone].    

## 2012-05-03 ENCOUNTER — Ambulatory Visit (HOSPITAL_BASED_OUTPATIENT_CLINIC_OR_DEPARTMENT_OTHER): Payer: Medicaid Other

## 2012-05-03 VITALS — BP 107/71 | HR 102 | Temp 98.4°F

## 2012-05-03 DIAGNOSIS — Z5111 Encounter for antineoplastic chemotherapy: Secondary | ICD-10-CM

## 2012-05-03 DIAGNOSIS — C9240 Acute promyelocytic leukemia, not having achieved remission: Secondary | ICD-10-CM

## 2012-05-03 DIAGNOSIS — C9200 Acute myeloblastic leukemia, not having achieved remission: Secondary | ICD-10-CM

## 2012-05-03 MED ORDER — HEPARIN SOD (PORK) LOCK FLUSH 100 UNIT/ML IV SOLN
500.0000 [IU] | Freq: Once | INTRAVENOUS | Status: AC | PRN
  Administered 2012-05-03: 500 [IU]
  Filled 2012-05-03: qty 5

## 2012-05-03 MED ORDER — SODIUM CHLORIDE 0.9 % IJ SOLN
10.0000 mL | INTRAMUSCULAR | Status: DC | PRN
  Administered 2012-05-03: 10 mL
  Filled 2012-05-03: qty 10

## 2012-05-03 MED ORDER — SODIUM CHLORIDE 0.9 % IV SOLN
0.1540 mg/kg/d | Freq: Every day | INTRAVENOUS | Status: DC
  Administered 2012-05-03: 12 mg via INTRAVENOUS
  Filled 2012-05-03: qty 12

## 2012-05-03 MED ORDER — SODIUM CHLORIDE 0.9 % IV SOLN
Freq: Once | INTRAVENOUS | Status: AC
  Administered 2012-05-03: 13:00:00 via INTRAVENOUS

## 2012-05-03 MED ORDER — ONDANSETRON 8 MG/50ML IVPB (CHCC)
8.0000 mg | Freq: Once | INTRAVENOUS | Status: AC
  Administered 2012-05-03: 8 mg via INTRAVENOUS

## 2012-05-03 MED ORDER — DEXAMETHASONE SODIUM PHOSPHATE 10 MG/ML IJ SOLN
10.0000 mg | Freq: Once | INTRAMUSCULAR | Status: AC
  Administered 2012-05-03: 10 mg via INTRAVENOUS

## 2012-05-03 NOTE — Patient Instructions (Signed)
[facility] Discharge Instructions for Patients Receiving Chemotherapy  Today you received the following chemotherapy agents Arsenic Trioxide  To help prevent nausea and vomiting after your treatment, we encourage you to take your nausea medication as prescribed.   If you develop nausea and vomiting that is not controlled by your nausea medication, call the clinic. If it is after clinic hours your family physician or the after hours number for the clinic or go to the Emergency Department.   BELOW ARE SYMPTOMS THAT SHOULD BE REPORTED IMMEDIATELY:  *FEVER GREATER THAN 100.5 F  *CHILLS WITH OR WITHOUT FEVER  NAUSEA AND VOMITING THAT IS NOT CONTROLLED WITH YOUR NAUSEA MEDICATION  *UNUSUAL SHORTNESS OF BREATH  *UNUSUAL BRUISING OR BLEEDING  TENDERNESS IN MOUTH AND THROAT WITH OR WITHOUT PRESENCE OF ULCERS  *URINARY PROBLEMS  *BOWEL PROBLEMS  UNUSUAL RASH Items with * indicate a potential emergency and should be followed up as soon as possible.  One of the nurses will contact you 24 hours after your treatment. Please let the nurse know about any problems that you may have experienced. Feel free to call the clinic you have any questions or concerns. The clinic phone number is [phone].   I have been informed and understand all the instructions given to me. I know to contact the clinic, my physician, or go to the Emergency Department if any problems should occur. I do not have any questions at this time, but understand that I may call the clinic during office hours   should I have any questions or need assistance in obtaining follow up care.    __________________________________________  _____________  __________ Signature of Patient or Authorized Representative            Date                   Time    __________________________________________ Nurse's Signature    

## 2012-05-04 ENCOUNTER — Other Ambulatory Visit: Payer: Self-pay | Admitting: *Deleted

## 2012-05-04 ENCOUNTER — Other Ambulatory Visit: Payer: Medicaid Other | Admitting: Lab

## 2012-05-04 ENCOUNTER — Telehealth: Payer: Self-pay

## 2012-05-04 ENCOUNTER — Ambulatory Visit (HOSPITAL_BASED_OUTPATIENT_CLINIC_OR_DEPARTMENT_OTHER): Payer: Medicaid Other

## 2012-05-04 ENCOUNTER — Ambulatory Visit: Payer: Medicaid Other | Admitting: Oncology

## 2012-05-04 VITALS — BP 106/61 | HR 88 | Temp 98.1°F

## 2012-05-04 DIAGNOSIS — Z5111 Encounter for antineoplastic chemotherapy: Secondary | ICD-10-CM

## 2012-05-04 DIAGNOSIS — C9200 Acute myeloblastic leukemia, not having achieved remission: Secondary | ICD-10-CM

## 2012-05-04 DIAGNOSIS — C9240 Acute promyelocytic leukemia, not having achieved remission: Secondary | ICD-10-CM

## 2012-05-04 MED ORDER — MICONAZOLE NITRATE 4 % VA CREA: 1.0000 "application " | TOPICAL_CREAM | VAGINAL | Status: DC

## 2012-05-04 MED ORDER — HEPARIN SOD (PORK) LOCK FLUSH 100 UNIT/ML IV SOLN
500.0000 [IU] | Freq: Once | INTRAVENOUS | Status: AC | PRN
  Administered 2012-05-04: 500 [IU]
  Filled 2012-05-04: qty 5

## 2012-05-04 MED ORDER — SODIUM CHLORIDE 0.9 % IV SOLN
Freq: Once | INTRAVENOUS | Status: AC
  Administered 2012-05-04: 13:00:00 via INTRAVENOUS

## 2012-05-04 MED ORDER — SODIUM CHLORIDE 0.9 % IV SOLN
0.1540 mg/kg | Freq: Every day | INTRAVENOUS | Status: DC
  Administered 2012-05-04: 12 mg via INTRAVENOUS
  Filled 2012-05-04: qty 12

## 2012-05-04 MED ORDER — ONDANSETRON 8 MG/50ML IVPB (CHCC)
8.0000 mg | Freq: Once | INTRAVENOUS | Status: AC
  Administered 2012-05-04: 8 mg via INTRAVENOUS

## 2012-05-04 MED ORDER — SODIUM CHLORIDE 0.9 % IJ SOLN
10.0000 mL | INTRAMUSCULAR | Status: DC | PRN
  Administered 2012-05-04: 10 mL
  Filled 2012-05-04: qty 10

## 2012-05-04 MED ORDER — DEXAMETHASONE SODIUM PHOSPHATE 10 MG/ML IJ SOLN
10.0000 mg | Freq: Once | INTRAMUSCULAR | Status: AC
  Administered 2012-05-04: 10 mg via INTRAVENOUS

## 2012-05-04 NOTE — Patient Instructions (Signed)
[facility] Discharge Instructions for Patients Receiving Chemotherapy  Today you received the following chemotherapy agents Arsenic To help prevent nausea and vomiting after your treatment, we encourage you to take your nausea medication as directed .   If you develop nausea and vomiting that is not controlled by your nausea medication, call the clinic. If it is after clinic hours your family physician or the after hours number for the clinic or go to the Emergency Department.   BELOW ARE SYMPTOMS THAT SHOULD BE REPORTED IMMEDIATELY:  *FEVER GREATER THAN 100.5 F  *CHILLS WITH OR WITHOUT FEVER  NAUSEA AND VOMITING THAT IS NOT CONTROLLED WITH YOUR NAUSEA MEDICATION  *UNUSUAL SHORTNESS OF BREATH  *UNUSUAL BRUISING OR BLEEDING  TENDERNESS IN MOUTH AND THROAT WITH OR WITHOUT PRESENCE OF ULCERS  *URINARY PROBLEMS  *BOWEL PROBLEMS  UNUSUAL RASH Items with * indicate a potential emergency and should be followed up as soon as possible.  One of the nurses will contact you 24 hours after your treatment. Please let the nurse know about any problems that you may have experienced. Feel free to call the clinic you have any questions or concerns. The clinic phone number is [phone].   I have been informed and understand all the instructions given to me. I know to contact the clinic, my physician, or go to the Emergency Department if any problems should occur. I do not have any questions at this time, but understand that I may call the clinic during office hours   should I have any questions or need assistance in obtaining follow up care.    __________________________________________  _____________  __________ Signature of Patient or Authorized Representative            Date                   Time    __________________________________________ Nurse's Signature

## 2012-05-04 NOTE — Telephone Encounter (Signed)
Per pt appts cancelled for 8/15 due to court. TMB

## 2012-05-05 ENCOUNTER — Ambulatory Visit: Payer: Medicaid Other

## 2012-05-05 ENCOUNTER — Other Ambulatory Visit: Payer: Medicaid Other | Admitting: Lab

## 2012-05-06 ENCOUNTER — Other Ambulatory Visit (HOSPITAL_BASED_OUTPATIENT_CLINIC_OR_DEPARTMENT_OTHER): Payer: Medicaid Other | Admitting: Lab

## 2012-05-06 ENCOUNTER — Ambulatory Visit (HOSPITAL_BASED_OUTPATIENT_CLINIC_OR_DEPARTMENT_OTHER): Payer: Medicaid Other

## 2012-05-06 ENCOUNTER — Telehealth: Payer: Self-pay | Admitting: *Deleted

## 2012-05-06 ENCOUNTER — Other Ambulatory Visit: Payer: Self-pay | Admitting: Certified Registered Nurse Anesthetist

## 2012-05-06 ENCOUNTER — Other Ambulatory Visit: Payer: Self-pay | Admitting: *Deleted

## 2012-05-06 VITALS — BP 133/86 | HR 79 | Temp 98.7°F

## 2012-05-06 DIAGNOSIS — Z5111 Encounter for antineoplastic chemotherapy: Secondary | ICD-10-CM

## 2012-05-06 DIAGNOSIS — C9240 Acute promyelocytic leukemia, not having achieved remission: Secondary | ICD-10-CM

## 2012-05-06 DIAGNOSIS — C9200 Acute myeloblastic leukemia, not having achieved remission: Secondary | ICD-10-CM

## 2012-05-06 DIAGNOSIS — E119 Type 2 diabetes mellitus without complications: Secondary | ICD-10-CM

## 2012-05-06 LAB — COMPREHENSIVE METABOLIC PANEL
Alkaline Phosphatase: 67 U/L (ref 39–117)
Glucose, Bld: 302 mg/dL — ABNORMAL HIGH (ref 70–99)
Sodium: 137 mEq/L (ref 135–145)
Total Bilirubin: 0.6 mg/dL (ref 0.3–1.2)
Total Protein: 6.7 g/dL (ref 6.0–8.3)

## 2012-05-06 MED ORDER — DEXAMETHASONE SODIUM PHOSPHATE 10 MG/ML IJ SOLN
10.0000 mg | Freq: Once | INTRAMUSCULAR | Status: AC
  Administered 2012-05-06: 10 mg via INTRAVENOUS

## 2012-05-06 MED ORDER — SODIUM CHLORIDE 0.9 % IJ SOLN
10.0000 mL | INTRAMUSCULAR | Status: DC | PRN
  Administered 2012-05-06: 10 mL
  Filled 2012-05-06: qty 10

## 2012-05-06 MED ORDER — SODIUM CHLORIDE 0.9 % IV SOLN
0.1540 mg/kg/d | Freq: Every day | INTRAVENOUS | Status: DC
  Administered 2012-05-06: 12 mg via INTRAVENOUS
  Filled 2012-05-06: qty 12

## 2012-05-06 MED ORDER — SODIUM CHLORIDE 0.9 % IV SOLN
Freq: Once | INTRAVENOUS | Status: AC
  Administered 2012-05-06: 10:00:00 via INTRAVENOUS

## 2012-05-06 MED ORDER — ONDANSETRON 8 MG/50ML IVPB (CHCC)
8.0000 mg | Freq: Once | INTRAVENOUS | Status: AC
  Administered 2012-05-06: 8 mg via INTRAVENOUS

## 2012-05-06 MED ORDER — POTASSIUM CHLORIDE CRYS ER 20 MEQ PO TBCR
40.0000 meq | EXTENDED_RELEASE_TABLET | Freq: Once | ORAL | Status: AC
  Administered 2012-05-06: 40 meq via ORAL
  Filled 2012-05-06: qty 2

## 2012-05-06 MED ORDER — HEPARIN SOD (PORK) LOCK FLUSH 100 UNIT/ML IV SOLN
500.0000 [IU] | Freq: Once | INTRAVENOUS | Status: AC | PRN
  Administered 2012-05-06: 500 [IU]
  Filled 2012-05-06: qty 5

## 2012-05-06 NOTE — Patient Instructions (Addendum)
[facility] Discharge Instructions for Patients Receiving Chemotherapy  Today you received the following chemotherapy agents Arsenic.  To help prevent nausea and vomiting after your treatment, we encourage you to take your nausea medication as ordered per MD.    If you develop nausea and vomiting that is not controlled by your nausea medication, call the clinic. If it is after clinic hours your family physician or the after hours number for the clinic or go to the Emergency Department.   BELOW ARE SYMPTOMS THAT SHOULD BE REPORTED IMMEDIATELY:  *FEVER GREATER THAN 100.5 F  *CHILLS WITH OR WITHOUT FEVER  NAUSEA AND VOMITING THAT IS NOT CONTROLLED WITH YOUR NAUSEA MEDICATION  *UNUSUAL SHORTNESS OF BREATH  *UNUSUAL BRUISING OR BLEEDING  TENDERNESS IN MOUTH AND THROAT WITH OR WITHOUT PRESENCE OF ULCERS  *URINARY PROBLEMS  *BOWEL PROBLEMS  UNUSUAL RASH Items with * indicate a potential emergency and should be followed up as soon as possible.  . Please let the nurse know about any problems that you may have experienced. Feel free to call the clinic you have any questions or concerns. The clinic phone number is [phone].   I have been informed and understand all the instructions given to me. I know to contact the clinic, my physician, or go to the Emergency Department if any problems should occur. I do not have any questions at this time, but understand that I may call the clinic during office hours   should I have any questions or need assistance in obtaining follow up care.    __________________________________________  _____________  __________ Signature of Patient or Authorized Representative            Date                   Time    __________________________________________ Nurse's Signature

## 2012-05-06 NOTE — Telephone Encounter (Signed)
Per pt, had her RX for Depo-provera refilled 04/21/12 this has been verified with [facility]. Pt verbalizes that she gave herself the injection. This desk nurse inquired how pt was instructed to give an injection. Pt reports that as an inpatient at [facility] an educational session teaching how to give an injection in the muscle. Pt states that "I gave myself the shot in the muscle" NR has been notified.

## 2012-05-09 ENCOUNTER — Other Ambulatory Visit: Payer: Self-pay

## 2012-05-09 ENCOUNTER — Other Ambulatory Visit: Payer: Medicaid Other | Admitting: Lab

## 2012-05-09 ENCOUNTER — Ambulatory Visit (HOSPITAL_BASED_OUTPATIENT_CLINIC_OR_DEPARTMENT_OTHER): Payer: Medicaid Other | Admitting: Family

## 2012-05-09 ENCOUNTER — Encounter: Payer: Self-pay | Admitting: Family

## 2012-05-09 ENCOUNTER — Other Ambulatory Visit (HOSPITAL_BASED_OUTPATIENT_CLINIC_OR_DEPARTMENT_OTHER): Payer: Medicaid Other | Admitting: Family

## 2012-05-09 ENCOUNTER — Other Ambulatory Visit: Payer: Self-pay | Admitting: Oncology

## 2012-05-09 ENCOUNTER — Other Ambulatory Visit: Payer: Self-pay | Admitting: *Deleted

## 2012-05-09 ENCOUNTER — Ambulatory Visit (HOSPITAL_BASED_OUTPATIENT_CLINIC_OR_DEPARTMENT_OTHER): Payer: Medicaid Other

## 2012-05-09 VITALS — BP 114/76 | HR 102 | Temp 99.1°F | Resp 18 | Ht 61.0 in | Wt 268.6 lb

## 2012-05-09 DIAGNOSIS — B379 Candidiasis, unspecified: Secondary | ICD-10-CM

## 2012-05-09 DIAGNOSIS — C9240 Acute promyelocytic leukemia, not having achieved remission: Secondary | ICD-10-CM

## 2012-05-09 DIAGNOSIS — M25559 Pain in unspecified hip: Secondary | ICD-10-CM

## 2012-05-09 DIAGNOSIS — C9200 Acute myeloblastic leukemia, not having achieved remission: Secondary | ICD-10-CM

## 2012-05-09 DIAGNOSIS — M549 Dorsalgia, unspecified: Secondary | ICD-10-CM

## 2012-05-09 DIAGNOSIS — Z5111 Encounter for antineoplastic chemotherapy: Secondary | ICD-10-CM

## 2012-05-09 LAB — MAGNESIUM: Magnesium: 1.9 mg/dL (ref 1.5–2.5)

## 2012-05-09 LAB — CBC WITH DIFFERENTIAL/PLATELET
EOS%: 0.9 % (ref 0.0–7.0)
Eosinophils Absolute: 0.1 10*3/uL (ref 0.0–0.5)
HGB: 11.6 g/dL (ref 11.6–15.9)
MCV: 92.5 fL (ref 79.5–101.0)
MONO%: 6.1 % (ref 0.0–14.0)
NEUT#: 5.1 10*3/uL (ref 1.5–6.5)
RBC: 3.82 10*6/uL (ref 3.70–5.45)
RDW: 17 % — ABNORMAL HIGH (ref 11.2–14.5)
lymph#: 2.1 10*3/uL (ref 0.9–3.3)

## 2012-05-09 LAB — COMPREHENSIVE METABOLIC PANEL
AST: 11 U/L (ref 0–37)
Albumin: 3.9 g/dL (ref 3.5–5.2)
BUN: 17 mg/dL (ref 6–23)
CO2: 22 mEq/L (ref 19–32)
Calcium: 9.6 mg/dL (ref 8.4–10.5)
Chloride: 100 mEq/L (ref 96–112)
Creatinine, Ser: 0.48 mg/dL — ABNORMAL LOW (ref 0.50–1.10)
Glucose, Bld: 346 mg/dL — ABNORMAL HIGH (ref 70–99)
Potassium: 3.9 mEq/L (ref 3.5–5.3)

## 2012-05-09 MED ORDER — OXYCODONE HCL 20 MG PO TABS: 1.0000 | ORAL_TABLET | ORAL | Status: DC | PRN

## 2012-05-09 MED ORDER — ARSENIC TRIOXIDE CHEMO INJECTION 10MG/10ML
0.1540 mg/kg/d | Freq: Every day | INTRAVENOUS | Status: DC
  Administered 2012-05-09: 12 mg via INTRAVENOUS
  Filled 2012-05-09: qty 12

## 2012-05-09 MED ORDER — SODIUM CHLORIDE 0.9 % IV SOLN
Freq: Once | INTRAVENOUS | Status: AC
  Administered 2012-05-09: 14:00:00 via INTRAVENOUS

## 2012-05-09 MED ORDER — FLUCONAZOLE 200 MG PO TABS: 200.0000 mg | ORAL_TABLET | Freq: Every day | ORAL | Status: AC

## 2012-05-09 MED ORDER — ONDANSETRON 8 MG/50ML IVPB (CHCC)
8.0000 mg | Freq: Once | INTRAVENOUS | Status: AC
  Administered 2012-05-09: 8 mg via INTRAVENOUS

## 2012-05-09 MED ORDER — SODIUM CHLORIDE 0.9 % IJ SOLN
10.0000 mL | INTRAMUSCULAR | Status: DC | PRN
  Administered 2012-05-09: 10 mL
  Filled 2012-05-09: qty 10

## 2012-05-09 MED ORDER — DEXAMETHASONE SODIUM PHOSPHATE 10 MG/ML IJ SOLN
10.0000 mg | Freq: Once | INTRAMUSCULAR | Status: AC
  Administered 2012-05-09: 10 mg via INTRAVENOUS

## 2012-05-09 MED ORDER — HEPARIN SOD (PORK) LOCK FLUSH 100 UNIT/ML IV SOLN
500.0000 [IU] | Freq: Once | INTRAVENOUS | Status: AC | PRN
  Administered 2012-05-09: 500 [IU]
  Filled 2012-05-09: qty 5

## 2012-05-09 NOTE — Progress Notes (Signed)
Hematology and Oncology Follow Up Visit  Melissa Washington [id number] Feb 04, 1986 26 y.o. 04/12/12   HPI: 1. 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  Due to initiate day 22, cycle 3 of arsenic trioxide given on days 1-5, 8-12, 15-19, 22-26 every 2 months.  Oral ATRA per protocol 50mg  orally twice a day for 2 weeks on, two weeks off.  2. Diabetes mellitus type 2, poorly controlled.   3. History cholecystitis/laparoscopic cholecystectomy  4. History of Klebsiella bacteremia  5. History of hypomagnesemia  Interim History:   Melissa Washington is seen today in follow up, chief complaint is pain associated with ATRA. Pain is bilateral hips and back. Hast tried Claritin, she tells me there is no appreciable difference with Claritin. Has prescription for  Oxycodone 20 mg, has been taking 1 every 2 hours to control the pain. Pain seems to improve when she stops taking the ATRA.. She denies nausea, emesis, diarrhea, or constipation issues.  Had  follow up at [facility] 04/22/12, she tells me they informed her this is the last round of Arsenic. The [facility] physician has discussed with Dr Donnie Coffin. Primary care doctor has increased her Lantus, blood sugars are still not well controlled on 40 units a day. She will monitor her blood sugars and report back to her primary care Dr. No headache or blurred vision. No cough or shortness of breath. No abdominal pain. Bowel and bladder function are normal. Appetite is good, with adequate fluid intake. Reports persistent vaginal yeast infection in spite of use of Monistat cream prescribed on last visit. Remainder of the 10 point  review of systems is negative.  Medications:   I have reviewed the patient's current medications.  Allergies:  Allergies  Allergen Reactions  . Ultram (Tramadol Hcl) Nausea And Vomiting    Physical Exam: Filed Vitals:   05/09/12 1202  BP: 114/76  Pulse: 102  Temp: 99.1 F (37.3 C)  Resp: 18    Body mass index is 50.75  kg/(m^2). Weight: 271 lbs. HEENT:  Sclerae anicteric, conjunctivae pink.  Oropharynx clear.  No mucositis or candidiasis.   Nodes:  No cervical, supraclavicular, or axillary lymphadenopathy palpated.   Lungs:  Clear to auscultation bilaterally.  No crackles, rhonchi, or wheezes.   Heart:  Regular rate and rhythm. Her left anterior chest wall PORT site has healed, no evidence of erythema. Abdomen:  Soft, nontender.  Positive bowel sounds.  No organomegaly or masses palpated.   Musculoskeletal:  Mild pain with gentle palpation over the thoracic and lumbar spine region.   Extremities:  Benign.  No peripheral edema or cyanosis.   Skin:  Benign.   Neuro:  Nonfocal, alert and oriented x 3   Lab Results: Lab Results  Component Value Date   WBC 11.5* 05/02/2012   HGB 11.9 05/02/2012   HCT 36.3 05/02/2012   MCV 91.0 05/02/2012   PLT 270 05/02/2012   NEUTROABS 8.0* 05/02/2012     Chemistry      Component Value Date/Time   NA 137 05/06/2012 0919   K 3.4* 05/06/2012 0919   CL 100 05/06/2012 0919   CO2 22 05/06/2012 0919   BUN 19 05/06/2012 0919   CREATININE 0.55 05/06/2012 0919   GLU 479* 03/01/2012 1210      Component Value Date/Time   CALCIUM 9.3 05/06/2012 0919   ALKPHOS 67 05/06/2012 0919   AST 9 05/06/2012 0919   ALT 12 05/06/2012 0919   BILITOT 0.6 05/06/2012 0919  Assessment:  A 26 year old [place] 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  Due to initiate day 22, cycle 3 of arsenic trioxide given on days 1-5, 8-12, 15-19, 22-26 every 2 months..  Oral ATRA per protocol 50mg  orally twice a day for 2 weeks on, two weeks off.  2. Diabetes mellitus type 2, poorly controlled.  3. Pain, right hip and back. Well controlled with Oxycodone 20 mg every 2 hours.  4. Persistent vaginal yeast infection. 5. This week will complete treatment plan, per [facility]. 6. EKG normal sinus rhythm.   Plan:  1. Treatment today with arsenic. Continue ATRA as scheduled through the end of this week,  then discontinue.   2. Refill Oxycodone, 20 mg tabs, 1 every 2-3 hours as needed for pain.  3. Complete cycle of asenic this week.  4. Prescription for Diflucan for vaginal yeast infection.   Melissa Gene,  FNP-C

## 2012-05-09 NOTE — Patient Instructions (Signed)
Complete cycle of asenic this week.  Continue Attra until Friday, then discontinue.  Prescription for Diflucan for vaginal yeast infection.  Refill Oxycodone.

## 2012-05-09 NOTE — Patient Instructions (Signed)
Patient aware of next appointment; discharged home with boyfriend and no complaints.

## 2012-05-10 ENCOUNTER — Ambulatory Visit (HOSPITAL_BASED_OUTPATIENT_CLINIC_OR_DEPARTMENT_OTHER): Payer: Medicaid Other

## 2012-05-10 ENCOUNTER — Other Ambulatory Visit: Payer: Medicaid Other | Admitting: Lab

## 2012-05-10 VITALS — BP 124/76 | HR 76 | Temp 98.5°F | Resp 20

## 2012-05-10 DIAGNOSIS — C9240 Acute promyelocytic leukemia, not having achieved remission: Secondary | ICD-10-CM

## 2012-05-10 DIAGNOSIS — Z5111 Encounter for antineoplastic chemotherapy: Secondary | ICD-10-CM

## 2012-05-10 DIAGNOSIS — C9200 Acute myeloblastic leukemia, not having achieved remission: Secondary | ICD-10-CM

## 2012-05-10 MED ORDER — ARSENIC TRIOXIDE CHEMO INJECTION 10MG/10ML
0.1540 mg/kg/d | Freq: Every day | INTRAVENOUS | Status: DC
  Administered 2012-05-10: 12 mg via INTRAVENOUS
  Filled 2012-05-10: qty 12

## 2012-05-10 MED ORDER — SODIUM CHLORIDE 0.9 % IV SOLN
Freq: Once | INTRAVENOUS | Status: AC
  Administered 2012-05-10: 09:00:00 via INTRAVENOUS

## 2012-05-10 MED ORDER — SODIUM CHLORIDE 0.9 % IJ SOLN
10.0000 mL | INTRAMUSCULAR | Status: DC | PRN
  Filled 2012-05-10: qty 10

## 2012-05-10 MED ORDER — ONDANSETRON 8 MG/50ML IVPB (CHCC)
8.0000 mg | Freq: Once | INTRAVENOUS | Status: AC
  Administered 2012-05-10: 8 mg via INTRAVENOUS

## 2012-05-10 MED ORDER — DEXAMETHASONE SODIUM PHOSPHATE 10 MG/ML IJ SOLN
10.0000 mg | Freq: Once | INTRAMUSCULAR | Status: AC
  Administered 2012-05-10: 10 mg via INTRAVENOUS

## 2012-05-10 MED ORDER — HEPARIN SOD (PORK) LOCK FLUSH 100 UNIT/ML IV SOLN
500.0000 [IU] | Freq: Once | INTRAVENOUS | Status: DC | PRN
  Filled 2012-05-10: qty 5

## 2012-05-10 NOTE — Patient Instructions (Addendum)
[facility] Discharge Instructions for Patients Receiving Chemotherapy  Today you received the following chemotherapy agents arsenic  To help prevent nausea and vomiting after your treatment, we encourage you to take your nausea medication  and take it as often as prescribed    If you develop nausea and vomiting that is not controlled by your nausea medication, call the clinic. If it is after clinic hours your family physician or the after hours number for the clinic or go to the Emergency Department.   BELOW ARE SYMPTOMS THAT SHOULD BE REPORTED IMMEDIATELY:  *FEVER GREATER THAN 100.5 F  *CHILLS WITH OR WITHOUT FEVER  NAUSEA AND VOMITING THAT IS NOT CONTROLLED WITH YOUR NAUSEA MEDICATION  *UNUSUAL SHORTNESS OF BREATH  *UNUSUAL BRUISING OR BLEEDING  TENDERNESS IN MOUTH AND THROAT WITH OR WITHOUT PRESENCE OF ULCERS  *URINARY PROBLEMS  *BOWEL PROBLEMS  UNUSUAL RASH Items with * indicate a potential emergency and should be followed up as soon as possible.  One of the nurses will contact you 24 hours after your treatment. Please let the nurse know about any problems that you may have experienced. Feel free to call the clinic you have any questions or concerns. The clinic phone number is [phone].   I have been informed and understand all the instructions given to me. I know to contact the clinic, my physician, or go to the Emergency Department if any problems should occur. I do not have any questions at this time, but understand that I may call the clinic during office hours   should I have any questions or need assistance in obtaining follow up care.    __________________________________________  _____________  __________ Signature of Patient or Authorized Representative            Date                   Time    __________________________________________ Nurse's Signature    

## 2012-05-11 ENCOUNTER — Ambulatory Visit: Payer: Medicaid Other | Admitting: Oncology

## 2012-05-11 ENCOUNTER — Ambulatory Visit (HOSPITAL_BASED_OUTPATIENT_CLINIC_OR_DEPARTMENT_OTHER): Payer: Medicaid Other

## 2012-05-11 VITALS — BP 134/63 | HR 80 | Temp 98.7°F | Resp 20

## 2012-05-11 DIAGNOSIS — C9240 Acute promyelocytic leukemia, not having achieved remission: Secondary | ICD-10-CM

## 2012-05-11 DIAGNOSIS — C9200 Acute myeloblastic leukemia, not having achieved remission: Secondary | ICD-10-CM

## 2012-05-11 DIAGNOSIS — Z5111 Encounter for antineoplastic chemotherapy: Secondary | ICD-10-CM

## 2012-05-11 MED ORDER — SODIUM CHLORIDE 0.9 % IJ SOLN
10.0000 mL | INTRAMUSCULAR | Status: DC | PRN
  Administered 2012-05-11: 10 mL
  Filled 2012-05-11: qty 10

## 2012-05-11 MED ORDER — DEXAMETHASONE SODIUM PHOSPHATE 10 MG/ML IJ SOLN
10.0000 mg | Freq: Once | INTRAMUSCULAR | Status: AC
  Administered 2012-05-11: 10 mg via INTRAVENOUS

## 2012-05-11 MED ORDER — SODIUM CHLORIDE 0.9 % IV SOLN
0.1540 mg/kg/d | Freq: Every day | INTRAVENOUS | Status: DC
  Administered 2012-05-11: 12 mg via INTRAVENOUS
  Filled 2012-05-11: qty 12

## 2012-05-11 MED ORDER — SODIUM CHLORIDE 0.9 % IV SOLN
Freq: Once | INTRAVENOUS | Status: AC
  Administered 2012-05-11: 10:00:00 via INTRAVENOUS

## 2012-05-11 MED ORDER — ONDANSETRON 8 MG/50ML IVPB (CHCC)
8.0000 mg | Freq: Once | INTRAVENOUS | Status: AC
  Administered 2012-05-11: 8 mg via INTRAVENOUS

## 2012-05-11 MED ORDER — HEPARIN SOD (PORK) LOCK FLUSH 100 UNIT/ML IV SOLN
500.0000 [IU] | Freq: Once | INTRAVENOUS | Status: AC | PRN
  Administered 2012-05-11: 500 [IU]
  Filled 2012-05-11: qty 5

## 2012-05-11 NOTE — Patient Instructions (Addendum)
[facility] Discharge Instructions for Patients Receiving Chemotherapy  Today you received the following chemotherapy agents Arsenic.  To help prevent nausea and vomiting after your treatment, we encourage you to take your nausea medication as needed. Begin taking it at 4 pm and take it as often as prescribed for the next 48 hours.    If you develop nausea and vomiting that is not controlled by your nausea medication, call the clinic. If it is after clinic hours your family physician or the after hours number for the clinic or go to the Emergency Department.   BELOW ARE SYMPTOMS THAT SHOULD BE REPORTED IMMEDIATELY:  *FEVER GREATER THAN 100.5 F  *CHILLS WITH OR WITHOUT FEVER  NAUSEA AND VOMITING THAT IS NOT CONTROLLED WITH YOUR NAUSEA MEDICATION  *UNUSUAL SHORTNESS OF BREATH  *UNUSUAL BRUISING OR BLEEDING  TENDERNESS IN MOUTH AND THROAT WITH OR WITHOUT PRESENCE OF ULCERS  *URINARY PROBLEMS  *BOWEL PROBLEMS  UNUSUAL RASH Items with * indicate a potential emergency and should be followed up as soon as possible.  One of the nurses will contact you 24 hours after your treatment. Please let the nurse know about any problems that you may have experienced. Feel free to call the clinic you have any questions or concerns. The clinic phone number is [phone].   I have been informed and understand all the instructions given to me. I know to contact the clinic, my physician, or go to the Emergency Department if any problems should occur. I do not have any questions at this time, but understand that I may call the clinic during office hours   should I have any questions or need assistance in obtaining follow up care.    __________________________________________  _____________  __________ Signature of Patient or Authorized Representative            Date                   Time    __________________________________________ Nurse's Signature

## 2012-05-12 ENCOUNTER — Other Ambulatory Visit: Payer: Medicaid Other | Admitting: Lab

## 2012-05-12 ENCOUNTER — Ambulatory Visit (HOSPITAL_BASED_OUTPATIENT_CLINIC_OR_DEPARTMENT_OTHER): Payer: Medicaid Other

## 2012-05-12 VITALS — BP 104/68 | HR 90 | Temp 99.0°F

## 2012-05-12 DIAGNOSIS — C9200 Acute myeloblastic leukemia, not having achieved remission: Secondary | ICD-10-CM

## 2012-05-12 DIAGNOSIS — C9240 Acute promyelocytic leukemia, not having achieved remission: Secondary | ICD-10-CM

## 2012-05-12 DIAGNOSIS — Z5111 Encounter for antineoplastic chemotherapy: Secondary | ICD-10-CM

## 2012-05-12 LAB — COMPREHENSIVE METABOLIC PANEL
AST: 12 U/L (ref 0–37)
Albumin: 4.4 g/dL (ref 3.5–5.2)
Alkaline Phosphatase: 79 U/L (ref 39–117)
BUN: 31 mg/dL — ABNORMAL HIGH (ref 6–23)
Glucose, Bld: 505 mg/dL — ABNORMAL HIGH (ref 70–99)
Potassium: 4.2 mEq/L (ref 3.5–5.3)
Sodium: 132 mEq/L — ABNORMAL LOW (ref 135–145)
Total Bilirubin: 0.6 mg/dL (ref 0.3–1.2)

## 2012-05-12 MED ORDER — SODIUM CHLORIDE 0.9 % IV SOLN
0.1540 mg/kg/d | Freq: Every day | INTRAVENOUS | Status: DC
  Administered 2012-05-12: 12 mg via INTRAVENOUS
  Filled 2012-05-12: qty 12

## 2012-05-12 MED ORDER — HEPARIN SOD (PORK) LOCK FLUSH 100 UNIT/ML IV SOLN
500.0000 [IU] | Freq: Once | INTRAVENOUS | Status: AC | PRN
  Administered 2012-05-12: 500 [IU]
  Filled 2012-05-12: qty 5

## 2012-05-12 MED ORDER — DEXAMETHASONE SODIUM PHOSPHATE 10 MG/ML IJ SOLN
10.0000 mg | Freq: Once | INTRAMUSCULAR | Status: AC
  Administered 2012-05-12: 10 mg via INTRAVENOUS

## 2012-05-12 MED ORDER — ONDANSETRON 8 MG/50ML IVPB (CHCC)
8.0000 mg | Freq: Once | INTRAVENOUS | Status: AC
  Administered 2012-05-12: 8 mg via INTRAVENOUS

## 2012-05-12 MED ORDER — SODIUM CHLORIDE 0.9 % IV SOLN
Freq: Once | INTRAVENOUS | Status: AC
  Administered 2012-05-12: 09:00:00 via INTRAVENOUS

## 2012-05-12 MED ORDER — SODIUM CHLORIDE 0.9 % IJ SOLN
10.0000 mL | INTRAMUSCULAR | Status: DC | PRN
  Administered 2012-05-12: 10 mL
  Filled 2012-05-12: qty 10

## 2012-05-12 NOTE — Patient Instructions (Signed)
[facility] Discharge Instructions for Patients Receiving Chemotherapy  Today you received the following chemotherapy agents: arsenic  To help prevent nausea and vomiting after your treatment, we encourage you to take your nausea medication .   If you develop nausea and vomiting that is not controlled by your nausea medication, call the clinic. If it is after clinic hours your family physician or the after hours number for the clinic or go to the Emergency Department.   BELOW ARE SYMPTOMS THAT SHOULD BE REPORTED IMMEDIATELY:  *FEVER GREATER THAN 100.5 F  *CHILLS WITH OR WITHOUT FEVER  NAUSEA AND VOMITING THAT IS NOT CONTROLLED WITH YOUR NAUSEA MEDICATION  *UNUSUAL SHORTNESS OF BREATH  *UNUSUAL BRUISING OR BLEEDING  TENDERNESS IN MOUTH AND THROAT WITH OR WITHOUT PRESENCE OF ULCERS  *URINARY PROBLEMS  *BOWEL PROBLEMS  UNUSUAL RASH Items with * indicate a potential emergency and should be followed up as soon as possible.  One of the nurses will contact you 24 hours after your treatment. Please let the nurse know about any problems that you may have experienced. Feel free to call the clinic you have any questions or concerns. The clinic phone number is [phone].   I have been informed and understand all the instructions given to me. I know to contact the clinic, my physician, or go to the Emergency Department if any problems should occur. I do not have any questions at this time, but understand that I may call the clinic during office hours   should I have any questions or need assistance in obtaining follow up care.    __________________________________________  _____________  __________ Signature of Patient or Authorized Representative            Date                   Time    __________________________________________ Nurse's Signature

## 2012-05-13 ENCOUNTER — Ambulatory Visit (HOSPITAL_BASED_OUTPATIENT_CLINIC_OR_DEPARTMENT_OTHER): Payer: Medicaid Other

## 2012-05-13 VITALS — BP 138/96 | HR 97 | Temp 98.5°F

## 2012-05-13 DIAGNOSIS — C9240 Acute promyelocytic leukemia, not having achieved remission: Secondary | ICD-10-CM

## 2012-05-13 DIAGNOSIS — C9200 Acute myeloblastic leukemia, not having achieved remission: Secondary | ICD-10-CM

## 2012-05-13 DIAGNOSIS — Z5111 Encounter for antineoplastic chemotherapy: Secondary | ICD-10-CM

## 2012-05-13 MED ORDER — DEXAMETHASONE SODIUM PHOSPHATE 10 MG/ML IJ SOLN
10.0000 mg | Freq: Once | INTRAMUSCULAR | Status: AC
  Administered 2012-05-13: 10 mg via INTRAVENOUS

## 2012-05-13 MED ORDER — HEPARIN SOD (PORK) LOCK FLUSH 100 UNIT/ML IV SOLN
500.0000 [IU] | Freq: Once | INTRAVENOUS | Status: DC | PRN
  Filled 2012-05-13: qty 5

## 2012-05-13 MED ORDER — SODIUM CHLORIDE 0.9 % IV SOLN
Freq: Once | INTRAVENOUS | Status: DC
Start: 2012-05-13 — End: 2012-05-13

## 2012-05-13 MED ORDER — SODIUM CHLORIDE 0.9 % IJ SOLN
10.0000 mL | INTRAMUSCULAR | Status: DC | PRN
  Filled 2012-05-13: qty 10

## 2012-05-13 MED ORDER — ONDANSETRON 8 MG/50ML IVPB (CHCC)
8.0000 mg | Freq: Once | INTRAVENOUS | Status: AC
  Administered 2012-05-13: 8 mg via INTRAVENOUS

## 2012-05-13 MED ORDER — SODIUM CHLORIDE 0.9 % IV SOLN
0.1540 mg/kg/d | Freq: Every day | INTRAVENOUS | Status: DC
  Administered 2012-05-13: 12 mg via INTRAVENOUS
  Filled 2012-05-13: qty 12

## 2012-05-13 NOTE — Patient Instructions (Signed)
[facility] Discharge Instructions for Patients Receiving Chemotherapy  Today you received the following chemotherapy agents:arsenic   To help prevent nausea and vomiting after your treatment, we encourage you to take your nausea medication. If you develop nausea and vomiting that is not controlled by your nausea medication, call the clinic. If it is after clinic hours your family physician or the after hours number for the clinic or go to the Emergency Department.   BELOW ARE SYMPTOMS THAT SHOULD BE REPORTED IMMEDIATELY:  *FEVER GREATER THAN 100.5 F  *CHILLS WITH OR WITHOUT FEVER  NAUSEA AND VOMITING THAT IS NOT CONTROLLED WITH YOUR NAUSEA MEDICATION  *UNUSUAL SHORTNESS OF BREATH  *UNUSUAL BRUISING OR BLEEDING  TENDERNESS IN MOUTH AND THROAT WITH OR WITHOUT PRESENCE OF ULCERS  *URINARY PROBLEMS  *BOWEL PROBLEMS  UNUSUAL RASH Items with * indicate a potential emergency and should be followed up as soon as possible.   Please let the nurse know about any problems that you may have experienced. Feel free to call the clinic you have any questions or concerns. The clinic phone number is [phone].   I have been informed and understand all the instructions given to me. I know to contact the clinic, my physician, or go to the Emergency Department if any problems should occur. I do not have any questions at this time, but understand that I may call the clinic during office hours   should I have any questions or need assistance in obtaining follow up care.    __________________________________________  _____________  __________ Signature of Patient or Authorized Representative            Date                   Time    __________________________________________ Nurse's Signature

## 2012-05-20 ENCOUNTER — Encounter: Payer: Self-pay | Admitting: Family Medicine

## 2012-05-20 ENCOUNTER — Other Ambulatory Visit: Payer: Self-pay | Admitting: Family Medicine

## 2012-05-20 ENCOUNTER — Ambulatory Visit (INDEPENDENT_AMBULATORY_CARE_PROVIDER_SITE_OTHER): Payer: Medicaid Other | Admitting: Family Medicine

## 2012-05-20 ENCOUNTER — Telehealth: Payer: Self-pay | Admitting: Family Medicine

## 2012-05-20 ENCOUNTER — Other Ambulatory Visit (HOSPITAL_COMMUNITY)
Admission: RE | Admit: 2012-05-20 | Discharge: 2012-05-20 | Disposition: A | Discharge status: Home / Self Care | Payer: Medicaid Other | Source: Ambulatory Visit | Attending: Family Medicine | Admitting: Family Medicine

## 2012-05-20 ENCOUNTER — Other Ambulatory Visit: Payer: Self-pay | Admitting: Emergency Medicine

## 2012-05-20 VITALS — BP 135/87 | HR 105 | Temp 99.8°F | Ht 62.0 in | Wt 265.9 lb

## 2012-05-20 DIAGNOSIS — M549 Dorsalgia, unspecified: Secondary | ICD-10-CM

## 2012-05-20 DIAGNOSIS — N898 Other specified noninflammatory disorders of vagina: Secondary | ICD-10-CM

## 2012-05-20 DIAGNOSIS — Z124 Encounter for screening for malignant neoplasm of cervix: Secondary | ICD-10-CM

## 2012-05-20 DIAGNOSIS — Z01419 Encounter for gynecological examination (general) (routine) without abnormal findings: Secondary | ICD-10-CM | POA: Insufficient documentation

## 2012-05-20 DIAGNOSIS — N760 Acute vaginitis: Secondary | ICD-10-CM

## 2012-05-20 DIAGNOSIS — Z299 Encounter for prophylactic measures, unspecified: Secondary | ICD-10-CM | POA: Insufficient documentation

## 2012-05-20 LAB — POCT WET PREP (WET MOUNT)

## 2012-05-20 MED ORDER — FLUCONAZOLE 150 MG PO TABS: 150.0000 mg | ORAL_TABLET | Freq: Once | ORAL | Status: AC

## 2012-05-20 MED ORDER — OXYCODONE HCL 20 MG PO TABS
1.0000 | ORAL_TABLET | ORAL | Status: DC | PRN
Start: 2012-05-20 — End: 2012-05-31

## 2012-05-20 NOTE — Telephone Encounter (Signed)
Results of wet prep is positive for yeast. Called in a prescription for diflucan.

## 2012-05-20 NOTE — Assessment & Plan Note (Addendum)
-   Pap smear completed today   - Anatomy WNL, no lesions/erosions. NL vagina & cervix - wet mount completed today for vaginal irritation with brown discharge - Pt to f/u with me in one month to establish care and discuss DM    - Asked her to track BG and insulin over this time.

## 2012-05-20 NOTE — Telephone Encounter (Signed)
Please give patient call regarding results at the end of clinic

## 2012-05-20 NOTE — Assessment & Plan Note (Addendum)
-   Vaginal and perineum irritation  - Erythema from labia to anus and inner thighs  - Cytology slide (wet mount) completed.     - bloody/brown discharge noted - Will call patient with result today - Management dependent on slide results - Pt to f/u with me in one month to establish care and discuss DM   - Asked her to track BG and insulin over this time.

## 2012-05-20 NOTE — Progress Notes (Signed)
Subjective:     Patient ID: Melissa Washington, female   DOB: 05/31/1986, 26 y.o.   MRN: [id number]  HPI Backpain: Melissa Washington has an extensive history of Diabetes, hypertension and Leukemia. She reports she finished her chemotherapy treatments this past Friday (1 week ago). She has experienced "bone pain" through her entire chemo process and her oncologist has prescribed her "every type of pain medication" and it did not help her. She wants to be reassured this is a side effect of the chemo and not something else. She is also desiring pain medication today. She describes the pain as constant ache 10/10. She said she "just can not stand it anymore." She points to her bilateral hips and thoracic area to be her worse concentrated areas. She had to take all her pain meds from the oncologist every 2 hours, and now she is out.  Vaginal irritation and PAP: She has experienced irritation and itching for the past two months. She was prescribed diflucan x1 at that time and it was not effective. She has continued to itch and now the irritation has spread back to her rectal area. She admits to a foul vaginal odor and brown/gray discharge. She is monogamous with one female partner. PAP overdue. Review of Systems  Constitutional: Positive for fever. Negative for activity change and appetite change.  Genitourinary: Positive for vaginal bleeding and vaginal discharge. Negative for dysuria, frequency, decreased urine volume, difficulty urinating, vaginal pain and pelvic pain.  Musculoskeletal: Positive for back pain and arthralgias. Negative for joint swelling.  Neurological: Negative for tremors, weakness and numbness.   Febrile today at 99.8. Oncologist instructed her if she has a fever of 100.5 to go to the ED. Post chemo 1 week.     Objective:   Physical Exam BP 135/87  Pulse 105  Temp 99.8 F (37.7 C) (Oral)  Ht 5\' 2"  (1.575 m)  Wt 265 lb 14.4 oz (120.611 kg)  BMI 48.63 kg/m2 Gen: Well nourished. Obese.  Female. Happy and pleasant. Wearing a wig d/t her Chemotherapy. Present with fiance.  Heart: RRR. S1S2. No murmur. Lungs: CTAB. No wheeze or rale. Abd: Soft. Obese. ND. NT.  Back/hips: Tenderpoints along thoracic spine and hip joint. No obvious deformity. No erythema or bruising. No muscle spasms. ROM WNL bilateral hips. Gait normal. SPEC:   Labia majora and minora are red and irritated, continuing back the perineum to the anus.   Involvement of the inner thighs as well. Vaginal discharge of brownish bloody thick fluid   noticed. No obvious odor. Cervix: WNL.

## 2012-05-20 NOTE — Patient Instructions (Signed)
General Instructions for Vaginal Infections  Vaginitis is a term to describe many common vaginal infections. These infections may be due to an imbalance of normal germs (bacteria) that exist in the vagina. Many others are caused by sexually transmitted diseases (STD's). If any medication was prescribed to treat your specific infection, it is very important that you take the medication as directed. Your caregiver may want to examine and treat your sex partner.  CAUSES   The vagina normally contains organisms (bacteria and yeast) in a balance. Certain factors can disturb this balance and cause an infection, such as:   Sexual intercourse.   Nursing.   Pregnancy.   Menopause.   Hormone changes in the body.   Antibiotics.   Infection elsewhere in your body.   Birth control pills or patches.   Douches.   Spermicides.   Medical illnesses (diabetes).  SYMPTOMS   Different types of vaginal infections cause symptoms such as:   Itching.   Pain or burning.   Bad odor.   Pain or bleeding with sexual intercourse.   Redness of the vulva.   Abnormal discharge (yellow, green, heavy white and thick).   Fever.   A sore on the vulva or vagina.   Urinary symptoms (painful or bloody urine).   Pelvic and/or abdominal pain.   Rectal bleeding, discharge or pain.  DIAGNOSIS    Your caregiver will base the diagnosis upon the symptoms that you report.   A complete history of your sex life may be taken   You may have a pelvic exam.   A sample of your vaginal fluid and/or discharge will be examined under the microscope.   Cultures will help complete the exact diagnosis.  TREATMENT   Treatment depends on the cause of your vaginitis. Your treatment may include a medicine that kills germs (antibiotic). The antibiotic may be a shot, a pill, and/or vaginal suppository or cream. It is not uncommon for more than one type of infection to be present. If more than one infection is present, two or more medications may be  required. Reoccurrence of vaginal infections may be treated with vaginal suppositories or vaginal cream 2 times a week, or as directed.  If your caregiver finds that an STD exists, treatment of your sexual partner(s) is important. This is especially important for those infected with chlamydia, gonorrhea, trichomoniasis, bacterial vaginosis, syphilis and HIV infections. Treating sexual partners will prevent you from being re-infected and help stop the spread of STD infection to others. Although it is best to see a specialist for STD/HIV testing and counseling, this is not always possible. Some states/provinces permit something called "expedited partner therapy." This kind of program permits you to deliver prescription(s) to a partner without the partner having to seek a formal medical exam.   HOME CARE INSTRUCTIONS    Take all prescribed medication.   If applicable, speak to your partner about recommended treatment.   Do not have sexual intercourse for one week, or as directed by your caregiver.   Practice safe sex.   Use condoms.   Have only one sex partner.   Make sure your sex partner does not have any other sex partners.   Avoid tight pants and panty hose.   Wear cotton underwear.   Do not douche.   Avoid tampons, especially scented ones.   Take warm sitz baths.   Avoid vaginal sprays and perfumed soaps or bath oils.   Apply medicated cream (steroid cream) for itching or irritation with   the permission of your caregiver.  SEEK MEDICAL CARE IF:    You have any kind of abnormal vaginal discharge.   Your sex partner has a genital infection.   You have pain or bleeding with sexual intercourse.   You have itching, pain, irritation or bleeding of the vulva.  SEEK IMMEDIATE MEDICAL CARE IF:    You have an oral temperature above 102 F (38.9 C), not controlled by medicine.   You have belly (abdominal) pain.   Symptoms do not improve within 3 days or as directed.   You develop painful or bloody  urine.   You develop rectal pain, bleeding or discharge.  Document Released: 06/17/2005 Document Revised: 08/27/2011 Document Reviewed: 01/31/2009  Exit[employer] Patient Information 2012 [employer].

## 2012-05-20 NOTE — Assessment & Plan Note (Signed)
-   Thoracic and Hip pain  - h/o of Chemotherapy that ended 1 week ago.   - Probable Chemotherapy side effect of bone pain  - instructed patient she would have to speak with her oncologist for this pain and its treatment - PAP smear completed today   - Cytology slide (wet mount) completed for brown vaginal discharge - Pt to f/u with me in one month to establish care and discuss DM   - Asked her to track BG and insulin over this time.

## 2012-05-26 ENCOUNTER — Encounter: Payer: Self-pay | Admitting: Family Medicine

## 2012-05-27 ENCOUNTER — Other Ambulatory Visit: Payer: Self-pay | Admitting: Emergency Medicine

## 2012-05-30 ENCOUNTER — Telehealth: Payer: Self-pay | Admitting: *Deleted

## 2012-05-30 ENCOUNTER — Telehealth: Payer: Self-pay | Admitting: Emergency Medicine

## 2012-05-30 NOTE — Telephone Encounter (Signed)
Patient confirmed over the phone

## 2012-05-30 NOTE — Telephone Encounter (Signed)
Patient called reporting she is hurting all over and her joints feel like they are popping (Hips, back etc).  "I can hardly get out of bed."  Reports taking five aspirin and no relief.  Her PCP won't give her anything because "the pain is from her chemotherapy diagnosis and treatment"  Will notify providers.

## 2012-05-31 ENCOUNTER — Encounter: Payer: Self-pay | Admitting: Family

## 2012-05-31 ENCOUNTER — Encounter: Payer: Self-pay | Admitting: Oncology

## 2012-05-31 ENCOUNTER — Other Ambulatory Visit (HOSPITAL_BASED_OUTPATIENT_CLINIC_OR_DEPARTMENT_OTHER): Payer: Medicaid Other | Admitting: Lab

## 2012-05-31 ENCOUNTER — Other Ambulatory Visit: Payer: Self-pay | Admitting: Oncology

## 2012-05-31 ENCOUNTER — Ambulatory Visit (HOSPITAL_BASED_OUTPATIENT_CLINIC_OR_DEPARTMENT_OTHER): Payer: Medicaid Other | Admitting: Family

## 2012-05-31 ENCOUNTER — Encounter: Payer: Self-pay | Admitting: *Deleted

## 2012-05-31 ENCOUNTER — Telehealth: Payer: Self-pay | Admitting: *Deleted

## 2012-05-31 VITALS — BP 126/87 | HR 80 | Temp 98.7°F | Resp 20 | Ht 62.0 in | Wt 266.9 lb

## 2012-05-31 DIAGNOSIS — C9240 Acute promyelocytic leukemia, not having achieved remission: Secondary | ICD-10-CM

## 2012-05-31 DIAGNOSIS — C9200 Acute myeloblastic leukemia, not having achieved remission: Secondary | ICD-10-CM

## 2012-05-31 DIAGNOSIS — E119 Type 2 diabetes mellitus without complications: Secondary | ICD-10-CM

## 2012-05-31 DIAGNOSIS — M25559 Pain in unspecified hip: Secondary | ICD-10-CM

## 2012-05-31 DIAGNOSIS — M549 Dorsalgia, unspecified: Secondary | ICD-10-CM

## 2012-05-31 LAB — CBC WITH DIFFERENTIAL/PLATELET
BASO%: 0.3 % (ref 0.0–2.0)
Basophils Absolute: 0 10*3/uL (ref 0.0–0.1)
EOS%: 0.9 % (ref 0.0–7.0)
HGB: 11.7 g/dL (ref 11.6–15.9)
MCH: 30.7 pg (ref 25.1–34.0)
NEUT#: 2.8 10*3/uL (ref 1.5–6.5)
RBC: 3.8 10*6/uL (ref 3.70–5.45)
RDW: 15.7 % — ABNORMAL HIGH (ref 11.2–14.5)
lymph#: 1.8 10*3/uL (ref 0.9–3.3)

## 2012-05-31 LAB — COMPREHENSIVE METABOLIC PANEL (CC13)
AST: 11 U/L (ref 5–34)
BUN: 13 mg/dL (ref 7.0–26.0)
Calcium: 9.5 mg/dL (ref 8.4–10.4)
Chloride: 108 mEq/L — ABNORMAL HIGH (ref 98–107)
Creatinine: 0.9 mg/dL (ref 0.6–1.1)
Glucose: 309 mg/dl — ABNORMAL HIGH (ref 70–99)

## 2012-05-31 MED ORDER — METHADONE HCL 5 MG PO TABS: 5.0000 mg | ORAL_TABLET | ORAL | Status: AC | PRN

## 2012-05-31 MED ORDER — TRAMADOL HCL 50 MG PO TABS: 50.0000 mg | ORAL_TABLET | Freq: Four times a day (QID) | ORAL | Status: DC | PRN

## 2012-05-31 NOTE — Patient Instructions (Addendum)
Prescription for Methadone 5 mg every 2 hours as needed for pain. I will write the prescription for 80 pills which should last her until she returns to see Dr. Donnie Coffin next week for results of lab.  2. Lab studies per Dr. Donnie Coffin.

## 2012-05-31 NOTE — Progress Notes (Signed)
Hematology and Oncology Follow Up Visit  Melissa Washington [id number] [birth year] 26 y.o. 04/12/12   HPI: 1. 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  Due to initiate day 22, cycle 3 of arsenic trioxide given on days 1-5, 8-12, 15-19, 22-26 every 2 months.  Oral ATRA per protocol 50mg  orally twice a day for 2 weeks on, two weeks off.  2. Diabetes mellitus type 2, poorly controlled.   3. Washington cholecystitis/laparoscopic cholecystectomy  4. Washington of Klebsiella bacteremia  5. Washington of hypomagnesemia  Melissa Washington:   Melissa Washington is seen today for earlier than scheduled follow up, chief complaint is pain. Pain is bilateral hips and back. Had prescription for Oxycodone 20 mg, had been taking 1 every 1-2 hours to control the pain. She denies nausea, emesis, diarrhea, or constipation issues.  Primary care doctor has increased her Lantus, blood sugars are still not well controlled on 40 units a day. No headache or blurred vision. No cough or shortness of breath. No abdominal pain. Bowel and bladder function are normal. Appetite is good, with adequate fluid intake. Reports persistent vaginal yeast infection in spite of use of Monistat cream and Diflucanprescribed on previous visit. Primary care has represcribed Diflucan. Remainder of the 10 point  review of systems is negative.  Medications:   I have reviewed the patient's current medications.  Allergies:  Allergies  Allergen Reactions  . Ultram (Tramadol Hcl) Nausea And Vomiting    Physical Exam: Filed Vitals:   05/31/12 0840  BP: 126/87  Pulse: 80  Temp: 98.7 F (37.1 C)  Resp: 20    Body mass index is 48.82 kg/(m^2). Weight: 271 lbs. HEENT:  Sclerae anicteric, conjunctivae pink.  Oropharynx clear.  No mucositis or candidiasis.   Nodes:  No cervical, supraclavicular, or axillary lymphadenopathy palpated.   Lungs:  Clear to auscultation bilaterally.  No crackles, rhonchi, or wheezes.   Heart:   Regular rate and rhythm. Her left anterior chest wall PORT site has healed, no evidence of erythema. Abdomen:  Soft, nontender.  Positive bowel sounds.  No organomegaly or masses palpated.   Musculoskeletal:  Mild pain with gentle palpation over the thoracic and lumbar spine region.   Extremities:  Benign.  No peripheral edema or cyanosis.   Skin:  Benign.   Neuro:  Nonfocal, alert and oriented x 3   Lab Results: Lab Results  Component Value Date   WBC 5.1 05/31/2012   HGB 11.7 05/31/2012   HCT 34.8 05/31/2012   MCV 91.5 05/31/2012   PLT 205 05/31/2012   NEUTROABS 2.8 05/31/2012     Chemistry      Component Value Date/Time   NA 139 05/31/2012 0825   NA 132* 05/12/2012 0835   K 4.2 05/31/2012 0825   K 4.2 05/12/2012 0835   CL 108* 05/31/2012 0825   CL 95* 05/12/2012 0835   CO2 18* 05/31/2012 0825   CO2 20 05/12/2012 0835   BUN 13.0 05/31/2012 0825   BUN 31* 05/12/2012 0835   CREATININE 0.9 05/31/2012 0825   CREATININE 0.64 05/12/2012 0835   GLU 479* 03/01/2012 1210      Component Value Date/Time   CALCIUM 9.5 05/31/2012 0825   CALCIUM 10.4 05/12/2012 0835   ALKPHOS 62 05/31/2012 0825   ALKPHOS 79 05/12/2012 0835   AST 11 05/31/2012 0825   AST 12 05/12/2012 0835   ALT 23 05/31/2012 0825   ALT 16 05/12/2012 0835   BILITOT 0.90 05/31/2012 0825   BILITOT 0.6  05/12/2012 1610       Assessment:  A 26 year old [place] woman who has completed treatment for acute myelocytic leukemia.   2. Diabetes mellitus type 2, poorly controlled.  3. Pain, right hip and back. Previously controlled with Oxycodone 20 mg every 2 hours. She runs out of pain medication early according to how the instructions are written.  4. Persistent vaginal yeast infection, being treated by primary care.  5. Saw primary care last week who declined to write for add'l pain medications, referring her back to [place] for pain management.    Plan:  1. Prescription for Methadone 5 mg every 2 hours as needed for pain. I will  write the prescription for 80 pills which should last her until she returns to see Dr. Donnie Coffin next week for results of lab.  2. Lab studies per Dr. Donnie Coffin.   I let Melissa Washington know that it is our goal to wean her from the narcotics. We will offer a short course of methadone and order labs in an effort to characterize the source of her pain. She is accompanied by her fiance, they both verbalize understanding of the plan.   Melissa Lesser,  FNP-C

## 2012-05-31 NOTE — Progress Notes (Signed)
RECEIVED A FAX FROM [facility] CONCERNING A PRIOR AUTHORIZATION FOR METHADONE. THIS REQUEST WAS PLACED IN THE MANAGED CARE BIN.

## 2012-05-31 NOTE — Progress Notes (Signed)
Faxed pa form to [facility] for methadone 5mg , they no longer take pa requests over the phone for sedatives and narcotics.

## 2012-05-31 NOTE — Telephone Encounter (Signed)
Return in 7-10 days to see Dr. Donnie Coffin  06-14-2012 starting at 9:00 am

## 2012-06-04 LAB — PML/RAR TRANSLOCATION BY PCR

## 2012-06-08 ENCOUNTER — Other Ambulatory Visit: Payer: Self-pay | Admitting: Emergency Medicine

## 2012-06-08 DIAGNOSIS — C9240 Acute promyelocytic leukemia, not having achieved remission: Secondary | ICD-10-CM

## 2012-06-10 ENCOUNTER — Telehealth: Payer: Self-pay | Admitting: Emergency Medicine

## 2012-06-10 NOTE — Telephone Encounter (Signed)
Patient calls reporting that the pain medication that Dr Donnie Coffin prescribed is not working.  States she is having pain in her left hip and mid lower back.  States that the after taking the medicine her pain only improves from a 10/10 to 8-9/10.  States "I am eating them like M&M's".  States that the only pain med that works for her is the Oxycodone 10mg  every 2 hours and she has tried to tell Dr Donnie Coffin that.  Patient does state that she has a few pills left of the current pain med.   Informed patient that Dr Donnie Coffin is out of the office today and reminded her of her upcoming appointment with him on 9/24 at 0900 with labs at 0830.  Encouraged patient to keep future appointments.

## 2012-06-14 ENCOUNTER — Ambulatory Visit (HOSPITAL_BASED_OUTPATIENT_CLINIC_OR_DEPARTMENT_OTHER): Payer: Medicaid Other | Admitting: Oncology

## 2012-06-14 ENCOUNTER — Telehealth: Payer: Self-pay | Admitting: *Deleted

## 2012-06-14 ENCOUNTER — Other Ambulatory Visit: Payer: Medicaid Other | Admitting: Lab

## 2012-06-14 VITALS — BP 160/90 | HR 73 | Temp 98.6°F | Resp 20 | Ht 62.0 in | Wt 265.8 lb

## 2012-06-14 DIAGNOSIS — C9200 Acute myeloblastic leukemia, not having achieved remission: Secondary | ICD-10-CM

## 2012-06-14 DIAGNOSIS — M549 Dorsalgia, unspecified: Secondary | ICD-10-CM

## 2012-06-14 DIAGNOSIS — C9240 Acute promyelocytic leukemia, not having achieved remission: Secondary | ICD-10-CM

## 2012-06-14 DIAGNOSIS — E119 Type 2 diabetes mellitus without complications: Secondary | ICD-10-CM

## 2012-06-14 LAB — COMPREHENSIVE METABOLIC PANEL (CC13)
Albumin: 4.6 g/dL (ref 3.5–5.0)
BUN: 11 mg/dL (ref 7.0–26.0)
CO2: 19 mEq/L — ABNORMAL LOW (ref 22–29)
Calcium: 10.4 mg/dL (ref 8.4–10.4)
Chloride: 108 mEq/L — ABNORMAL HIGH (ref 98–107)
Glucose: 223 mg/dl — ABNORMAL HIGH (ref 70–99)
Potassium: 3.8 mEq/L (ref 3.5–5.1)
Sodium: 141 mEq/L (ref 136–145)
Total Protein: 7.2 g/dL (ref 6.4–8.3)

## 2012-06-14 LAB — CBC WITH DIFFERENTIAL/PLATELET
Basophils Absolute: 0 10*3/uL (ref 0.0–0.1)
Eosinophils Absolute: 0 10*3/uL (ref 0.0–0.5)
HGB: 12.5 g/dL (ref 11.6–15.9)
MCV: 89.7 fL (ref 79.5–101.0)
MONO#: 0.4 10*3/uL (ref 0.1–0.9)
NEUT#: 3.8 10*3/uL (ref 1.5–6.5)
RDW: 15 % — ABNORMAL HIGH (ref 11.2–14.5)
WBC: 6 10*3/uL (ref 3.9–10.3)
lymph#: 1.7 10*3/uL (ref 0.9–3.3)

## 2012-06-14 MED ORDER — HYDROCODONE-ACETAMINOPHEN 5-500 MG PO TABS: 1.0000 | ORAL_TABLET | Freq: Four times a day (QID) | ORAL | Status: DC | PRN

## 2012-06-14 MED ORDER — OXYCODONE HCL 5 MG PO TABS: 5.0000 mg | ORAL_TABLET | ORAL | Status: DC | PRN

## 2012-06-14 NOTE — Telephone Encounter (Signed)
07-21-2012 LAB FLUSH AND MD APPOINTMENT   PATIENT IS AWARE OF THE APPOINTMENTS

## 2012-06-15 ENCOUNTER — Telehealth: Payer: Self-pay | Admitting: *Deleted

## 2012-06-15 NOTE — Telephone Encounter (Signed)
Per MD, have notified pt to take oxycodone RX (06/14/12) every 4 to 6 hours for this week and try to "wean herself" off by 1 tab a day for the next week, and by week 3 to 2 tabs daily. Pt will report back to this desk if pain is uncontrollable.

## 2012-06-23 ENCOUNTER — Ambulatory Visit: Payer: Medicaid Other | Admitting: Family Medicine

## 2012-06-27 ENCOUNTER — Other Ambulatory Visit: Payer: Self-pay | Admitting: *Deleted

## 2012-06-27 DIAGNOSIS — C9240 Acute promyelocytic leukemia, not having achieved remission: Secondary | ICD-10-CM

## 2012-06-27 MED ORDER — OXYCODONE HCL 5 MG PO TABS: 5.0000 mg | ORAL_TABLET | ORAL | Status: DC | PRN

## 2012-07-21 ENCOUNTER — Ambulatory Visit (HOSPITAL_COMMUNITY)
Admission: RE | Admit: 2012-07-21 | Discharge: 2012-07-21 | Disposition: A | Discharge status: Home / Self Care | Payer: Medicaid Other | Source: Ambulatory Visit | Attending: Oncology | Admitting: Oncology

## 2012-07-21 ENCOUNTER — Ambulatory Visit: Payer: Medicaid Other | Admitting: Lab

## 2012-07-21 ENCOUNTER — Other Ambulatory Visit: Payer: Self-pay | Admitting: Emergency Medicine

## 2012-07-21 ENCOUNTER — Other Ambulatory Visit (HOSPITAL_COMMUNITY): Payer: Medicaid Other

## 2012-07-21 ENCOUNTER — Ambulatory Visit (HOSPITAL_BASED_OUTPATIENT_CLINIC_OR_DEPARTMENT_OTHER): Payer: Medicaid Other

## 2012-07-21 ENCOUNTER — Telehealth: Payer: Self-pay | Admitting: *Deleted

## 2012-07-21 ENCOUNTER — Other Ambulatory Visit (HOSPITAL_BASED_OUTPATIENT_CLINIC_OR_DEPARTMENT_OTHER): Payer: Medicaid Other | Admitting: Lab

## 2012-07-21 ENCOUNTER — Ambulatory Visit: Payer: Medicaid Other | Admitting: Oncology

## 2012-07-21 VITALS — BP 121/96 | HR 100 | Temp 98.2°F

## 2012-07-21 VITALS — BP 135/81 | HR 99 | Temp 98.5°F | Resp 20 | Ht 62.0 in | Wt 267.2 lb

## 2012-07-21 DIAGNOSIS — C9590 Leukemia, unspecified not having achieved remission: Secondary | ICD-10-CM

## 2012-07-21 DIAGNOSIS — M25559 Pain in unspecified hip: Secondary | ICD-10-CM | POA: Insufficient documentation

## 2012-07-21 DIAGNOSIS — M76899 Other specified enthesopathies of unspecified lower limb, excluding foot: Secondary | ICD-10-CM | POA: Insufficient documentation

## 2012-07-21 DIAGNOSIS — C9240 Acute promyelocytic leukemia, not having achieved remission: Secondary | ICD-10-CM

## 2012-07-21 DIAGNOSIS — E119 Type 2 diabetes mellitus without complications: Secondary | ICD-10-CM

## 2012-07-21 DIAGNOSIS — C9200 Acute myeloblastic leukemia, not having achieved remission: Secondary | ICD-10-CM

## 2012-07-21 LAB — CBC WITH DIFFERENTIAL/PLATELET
BASO%: 0.7 % (ref 0.0–2.0)
Eosinophils Absolute: 0.1 10*3/uL (ref 0.0–0.5)
MCHC: 34.3 g/dL (ref 31.5–36.0)
MCV: 86.6 fL (ref 79.5–101.0)
MONO#: 0.5 10*3/uL (ref 0.1–0.9)
MONO%: 6.7 % (ref 0.0–14.0)
NEUT#: 4.4 10*3/uL (ref 1.5–6.5)
RBC: 4.07 10*6/uL (ref 3.70–5.45)
RDW: 14.8 % — ABNORMAL HIGH (ref 11.2–14.5)
WBC: 7.1 10*3/uL (ref 3.9–10.3)

## 2012-07-21 LAB — COMPREHENSIVE METABOLIC PANEL (CC13)
AST: 26 U/L (ref 5–34)
Albumin: 4.8 g/dL (ref 3.5–5.0)
Alkaline Phosphatase: 80 U/L (ref 40–150)
BUN: 16 mg/dL (ref 7.0–26.0)
Potassium: 4.1 mEq/L (ref 3.5–5.1)
Total Bilirubin: 0.88 mg/dL (ref 0.20–1.20)

## 2012-07-21 MED ORDER — OXYCODONE HCL 5 MG PO TABS: 5.0000 mg | ORAL_TABLET | ORAL | Status: DC | PRN

## 2012-07-21 MED ORDER — HEPARIN SOD (PORK) LOCK FLUSH 100 UNIT/ML IV SOLN
500.0000 [IU] | Freq: Once | INTRAVENOUS | Status: AC
  Administered 2012-07-21: 500 [IU] via INTRAVENOUS
  Filled 2012-07-21: qty 5

## 2012-07-21 MED ORDER — SODIUM CHLORIDE 0.9 % IJ SOLN
10.0000 mL | INTRAMUSCULAR | Status: DC | PRN
  Administered 2012-07-21: 10 mL via INTRAVENOUS
  Filled 2012-07-21: qty 10

## 2012-07-21 NOTE — Telephone Encounter (Signed)
Patient gave all appointments

## 2012-07-21 NOTE — Patient Instructions (Signed)
To lab for blood draw

## 2012-07-21 NOTE — Progress Notes (Signed)
Patient to flush room for flush and lab draw.  Unable to get blood return from port after checked by two RN's.  Patient to lab for blood draw.

## 2012-07-22 NOTE — Progress Notes (Signed)
Hematology and Oncology Follow Up Visit  Melissa Washington [id number] [birth year] 26 y.o. 04/12/12   HPI: 1. 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 Status post completion of 4 months of Atrovent/arsenic therapy ongoing molecular response. 2. Diabetes mellitus type 2, poorly controlled.   3. History cholecystitis/laparoscopic cholecystectomy  4. History of Klebsiella bacteremia  5. History of hypomagnesemia  Interim History:   Melissa Washington is seen today for earlier than scheduled follow up, chief complaint is pain. She has been primarily complain of hip pain now more on the left on the right lung around her pain is improved. She had been taking oxycodone she ran out of 2 weeks ago has not been taking any the site that she's been able to do fairly well but the pain is persistent in her left hip. She has no other complaints. Her diabetes is under fairly good control. She is being managed by family medicine. Her denies any fevers or sweats. She denies any nausea vomiting or diarrhea. She is due to go to a [facility] sometime next week for followup as well. Medications:   I have reviewed the patient's current medications.  Allergies:  Allergies  Allergen Reactions  . Ultram (Tramadol Hcl) Nausea And Vomiting    Physical Exam: Filed Vitals:   07/21/12 1623  BP: 135/81  Pulse: 99  Temp: 98.5 F (36.9 C)  Resp: 20    Body mass index is 48.87 kg/(m^2). Weight: 271 lbs. HEENT:  Sclerae anicteric, conjunctivae pink.  Oropharynx clear.  No mucositis or candidiasis.   Nodes:  No cervical, supraclavicular, or axillary lymphadenopathy palpated.   Lungs:  Clear to auscultation bilaterally.  No crackles, rhonchi, or wheezes.   Heart:  Regular rate and rhythm. Her left anterior chest wall PORT site has healed, no evidence of erythema. Abdomen:  Soft, nontender.  Positive bowel sounds.  No organomegaly or masses palpated.   Musculoskeletal:  Mild pain with  gentle palpation over the thoracic and lumbar spine region.   Extremities:  Benign.  No peripheral edema or cyanosis.  I examined her hips. There is pain on internal or external rotation of the left hip. There is pain with straight leg raising as well. The right hip is less affected. Skin:  Benign.   Neuro:  Nonfocal, alert and oriented x 3   Lab Results: Lab Results  Component Value Date   WBC 7.1 07/21/2012   HGB 12.1 07/21/2012   HCT 35.3 07/21/2012   MCV 86.6 07/21/2012   PLT 265 07/21/2012   NEUTROABS 4.4 07/21/2012     Chemistry      Component Value Date/Time   NA 139 07/21/2012 1459   NA 132* 05/12/2012 0835   K 4.1 07/21/2012 1459   K 4.2 05/12/2012 0835   CL 108* 07/21/2012 1459   CL 95* 05/12/2012 0835   CO2 20* 07/21/2012 1459   CO2 20 05/12/2012 0835   BUN 16.0 07/21/2012 1459   BUN 31* 05/12/2012 0835   CREATININE 0.9 07/21/2012 1459   CREATININE 0.64 05/12/2012 0835   GLU 479* 03/01/2012 1210      Component Value Date/Time   CALCIUM 10.5* 07/21/2012 1459   CALCIUM 10.4 05/12/2012 0835   ALKPHOS 80 07/21/2012 1459   ALKPHOS 79 05/12/2012 0835   AST 26 07/21/2012 1459   AST 12 05/12/2012 0835   ALT 36 07/21/2012 1459   ALT 16 05/12/2012 0835   BILITOT 0.88 07/21/2012 1459   BILITOT 0.6 05/12/2012 1191  Assessment:  A 26 year old [place] woman who has completed treatment for acute pro= myelocytic leukemia.   2. Diabetes mellitus type 2, poorly controlled.  .    Plan:  I reviewed the patient's oxycodone prescription. We have discussed weight loss maneuvers in addition we've also discussed weaning off her oxycodone. I will get x-rays of her hips today. I will plan to see her in 3 months time.    Pierce Crane, MD

## 2012-07-24 NOTE — Progress Notes (Signed)
Hematology and Oncology Follow Up Visit  Melissa Washington [id number] 02/27/1986 26 y.o. 04/12/12   HPI: 1. 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 Status post completion of 4 months of Atrovent/arsenic therapy ongoing molecular response. 2. Diabetes mellitus type 2, poorly controlled.   3. History cholecystitis/laparoscopic cholecystectomy  4. History of Klebsiella bacteremia  5. History of hypomagnesemia  Interim History:   Melissa Washington is seen today for earlier than scheduled follow up, chief complaint is pain. She has been primarily complain of hip pain now more on the left on the right lung around her pain is improved. She had been taking oxycodone she ran out of 2 weeks ago has not been taking any the site that she's been able to do fairly well but the pain is persistent in her left hip. She has no other complaints. Her diabetes is under fairly good control. She is being managed by family medicine. Her denies any fevers or sweats. She denies any nausea vomiting or diarrhea. She is due to go to a [facility] sometime next week for followup as well. Medications:   I have reviewed the patient's current medications.  Allergies:  Allergies  Allergen Reactions  . Ultram (Tramadol Hcl) Nausea And Vomiting    Physical Exam: There were no vitals filed for this visit.  There is no height or weight on file to calculate BMI. Weight: 271 lbs. HEENT:  Sclerae anicteric, conjunctivae pink.  Oropharynx clear.  No mucositis or candidiasis.   Nodes:  No cervical, supraclavicular, or axillary lymphadenopathy palpated.   Lungs:  Clear to auscultation bilaterally.  No crackles, rhonchi, or wheezes.   Heart:  Regular rate and rhythm. Her left anterior chest wall PORT site has healed, no evidence of erythema. Abdomen:  Soft, nontender.  Positive bowel sounds.  No organomegaly or masses palpated.   Musculoskeletal:  Mild pain with gentle palpation over the thoracic  and lumbar spine region.   Extremities:  Benign.  No peripheral edema or cyanosis.  I examined her hips. There is pain on internal or external rotation of the left hip. There is pain with straight leg raising as well. The right hip is less affected. Skin:  Benign.   Neuro:  Nonfocal, alert and oriented x 3   Lab Results: Lab Results  Component Value Date   WBC 7.1 07/21/2012   HGB 12.1 07/21/2012   HCT 35.3 07/21/2012   MCV 86.6 07/21/2012   PLT 265 07/21/2012   NEUTROABS 4.4 07/21/2012     Chemistry      Component Value Date/Time   NA 139 07/21/2012 1459   NA 132* 05/12/2012 0835   K 4.1 07/21/2012 1459   K 4.2 05/12/2012 0835   CL 108* 07/21/2012 1459   CL 95* 05/12/2012 0835   CO2 20* 07/21/2012 1459   CO2 20 05/12/2012 0835   BUN 16.0 07/21/2012 1459   BUN 31* 05/12/2012 0835   CREATININE 0.9 07/21/2012 1459   CREATININE 0.64 05/12/2012 0835   GLU 479* 03/01/2012 1210      Component Value Date/Time   CALCIUM 10.5* 07/21/2012 1459   CALCIUM 10.4 05/12/2012 0835   ALKPHOS 80 07/21/2012 1459   ALKPHOS 79 05/12/2012 0835   AST 26 07/21/2012 1459   AST 12 05/12/2012 0835   ALT 36 07/21/2012 1459   ALT 16 05/12/2012 0835   BILITOT 0.88 07/21/2012 1459   BILITOT 0.6 05/12/2012 0835       Assessment:  A 26 year old [name] woman who has completed treatment for acute pro= myelocytic leukemia.   2. Diabetes mellitus type 2, poorly controlled.  .    Plan:  I reviewed the patient's oxycodone prescription. We have discussed weight loss maneuvers in addition we've also discussed weaning off her oxycodone. I will get x-rays of her hips today. I will plan to see her in 3 months time.  We sent her for an x-ray of the left hip. This shows acute bursitis with Calcific changes. I will send her off for referral for possible injection of the left hip.  Pierce Crane, MD

## 2012-07-26 LAB — PML/RAR TRANSLOCATION BY PCR: PML/RAR t(15;17) Translocation PCR: 0 ratio

## 2012-07-28 ENCOUNTER — Encounter: Payer: Self-pay | Admitting: Oncology

## 2012-07-28 NOTE — Progress Notes (Signed)
Patient had called and spoke with Lenise. She had advised that her apartment complex had not gotten the payment of 200.00 and said they were going to take her to court. I had it researched and the check cleared 03/21/12 for 200.00. I called the patient and had to leave her a message to call me back

## 2012-08-01 ENCOUNTER — Encounter: Payer: Self-pay | Admitting: Home Health Services

## 2012-08-02 ENCOUNTER — Encounter: Payer: Self-pay | Admitting: Home Health Services

## 2012-08-11 ENCOUNTER — Telehealth: Payer: Self-pay | Admitting: Family Medicine

## 2012-08-11 NOTE — Telephone Encounter (Signed)
Called pt and told her that she will need to be see prior to any referral being made. Pt has an appt on 12.10.13.Melissa [place]

## 2012-08-11 NOTE — Telephone Encounter (Signed)
Patient needs a referral to [facility].  She had an MRI done on hr hip and has tissue damage.

## 2012-08-24 ENCOUNTER — Other Ambulatory Visit: Payer: Self-pay | Admitting: *Deleted

## 2012-08-24 DIAGNOSIS — C9240 Acute promyelocytic leukemia, not having achieved remission: Secondary | ICD-10-CM

## 2012-08-24 MED ORDER — OXYCODONE HCL 10 MG PO TABS: 10.0000 mg | ORAL_TABLET | ORAL | Status: DC | PRN

## 2012-08-30 ENCOUNTER — Ambulatory Visit (INDEPENDENT_AMBULATORY_CARE_PROVIDER_SITE_OTHER): Payer: Medicaid Other | Admitting: Family Medicine

## 2012-08-30 ENCOUNTER — Encounter: Payer: Self-pay | Admitting: Family Medicine

## 2012-08-30 VITALS — BP 123/77 | HR 92 | Temp 99.5°F | Ht 61.0 in | Wt 270.0 lb

## 2012-08-30 DIAGNOSIS — M25559 Pain in unspecified hip: Secondary | ICD-10-CM

## 2012-08-30 DIAGNOSIS — E1165 Type 2 diabetes mellitus with hyperglycemia: Secondary | ICD-10-CM

## 2012-08-30 DIAGNOSIS — M25552 Pain in left hip: Secondary | ICD-10-CM

## 2012-08-30 DIAGNOSIS — M25551 Pain in right hip: Secondary | ICD-10-CM | POA: Insufficient documentation

## 2012-08-30 LAB — POCT GLYCOSYLATED HEMOGLOBIN (HGB A1C): Hemoglobin A1C: 8.6

## 2012-08-30 MED ORDER — OXYCODONE HCL 10 MG PO TABS: 10.0000 mg | ORAL_TABLET | Freq: Four times a day (QID) | ORAL | Status: DC | PRN

## 2012-08-30 NOTE — Assessment & Plan Note (Addendum)
-   A1C today: 8.6 - Admits to noncompliance: discussed importance of medications and diet - F/U: 4-6 months or pRN,

## 2012-08-30 NOTE — Progress Notes (Signed)
Subjective:     Patient ID: Melissa Washington, female   DOB: 03/23/1986, 26 y.o.   MRN: [id number]  HPI Diabetes: Patient admits to noncompliance with her diet. She denies hypo/hyperglycemic symptoms. She denies numbness and tingling in her extremities. She has been compliant with her medications. She does not check her sugars, I had asked her to bring a sample of them in to me and she did not.   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 She has seen the oncologist repeated for this and he has recommended we refer her to an orthopedic to evaluate the possible damage to her hip joints.   Review of Systems  All other systems reviewed and are negative.       Objective:   Physical Exam  Constitutional: She is oriented to person, place, and time. She appears well-developed and well-nourished. No distress.       Morbidly obese   Neck: Neck supple. No thyromegaly present.  Cardiovascular: Normal rate, regular rhythm, normal heart sounds and intact distal pulses.   No murmur heard. Pulmonary/Chest: Effort normal and breath sounds normal. No respiratory distress. She has no wheezes. She has no rales.  Abdominal: Soft. Bowel sounds are normal. She exhibits no distension. There is no tenderness. There is no rebound.  Musculoskeletal:       Foot exam: Bilateral feet have intact sensation to tensile test. No ulcerations or non-healing wounds.  Neurological: She is alert and oriented to person, place, and time.  Skin: Skin is warm and dry. She is not diaphoretic.  Psychiatric: She has a normal mood and affect.

## 2012-08-30 NOTE — Assessment & Plan Note (Signed)
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 - Has needed high dose pain medications from her oncologist for pain.  - Orthopedic referral ordered today, per oncologist recommendations - Will supply 1 month of pain mediation today to get her to her referral. - F/U: 4 - 6 months or PRN

## 2012-08-30 NOTE — Patient Instructions (Signed)
Diabetes, Keeping Your Heart and Blood Vessels Healthy Too much glucose (sugar) in the blood for a long period of time can cause problems for those who have diabetes. This high blood glucose can damage many parts of the body, such as the heart, blood vessels, eyes, and kidneys. Heart and blood vessel disease can lead to heart attacks and strokes, which is the leading cause of death for people with diabetes. There are many things you can to do slow down or prevent the complications from diabetes. WHAT DO MY HEART AND BLOOD VESSELS DO? Your heart and blood vessels make up your circulatory system. Your heart is a big muscle that pumps blood through your body. Your heart pumps blood carrying oxygen to large blood vessels (arteries) and small blood vessels (capillaries). Other blood vessels, called veins, carry blood back to the heart. HOW DO MY BLOOD VESSELS GET CLOGGED? Several things, including having diabetes, can make your blood cholesterol level too high. Cholesterol is a substance that is made by the body and used for many important functions. It is also found in some food that comes from animals. When cholesterol is too high, the insides of large blood vessels become narrowed, even clogged. Narrowed and clogged blood vessels make it harder for enough blood to get to all parts of your body. This problem is called atherosclerosis. WHAT CAN HAPPEN WHEN BLOOD VESSELS ARE CLOGGED? When arteries become narrowed and clogged, you may have heart problems and are at increased risk for heart attack and stroke:  Chest pain (angina) causes pain in your chest, arms, shoulders, or back. You may feel the pain more when your heart beats faster, such as when you exercise. The pain may go away when you rest. You also may feel very weak and sweaty. If you do not get treatment, chest pain may happen more often. If diabetes has damaged the heart nerves, you may not feel the chest pain.  Heart attack. A heart attack happens  when a blood vessel in or near the heart becomes blocked. Not enough blood can get to that part of the heart muscle so the area becomes oxygen deprived and the heart muscle may be permanently damaged. WHAT ARE THE WARNING SIGNS OF A HEART ATTACK? You may have one or more of the following warning signs:  Chest pain or discomfort.  Pain or discomfort in your arms, back, jaw, or neck.  Indigestion or stomach pain.  Shortness of breath.  Sweating.  Nausea or vomiting.  Light-headedness.  You may have no warning signs at all, or they may come and go. HOW DOES HEART DISEASE CAUSE HIGH BLOOD PRESSURE? Narrowed blood vessels leave a smaller opening for blood to flow through. It is like turning on a garden hose and holding your thumb over the opening. The smaller opening makes the water shoot out with more pressure. In the same way, narrowed blood vessels lead to high blood pressure. Other factors, such as kidney problems and being overweight, can also lead to high blood pressure. Many people with diabetes also have high blood pressure. If you have heart, eye, or kidney problems from diabetes, high blood pressure can make them worse. If you have high blood pressure, ask your caregiver how to lower it. Your caregiver may be asked to take blood pressure medicine every day. Some types of blood pressure medicine can also help keep your kidneys healthy. To lower your blood pressure, your may be asked to lose weight; eat more fruits and vegetables; eat   less salt and high-sodium foods, such as canned soups, luncheon meats, salty snack foods, and fast foods; and drink less alcohol. WHAT ARE THE WARNING SIGNS OF A STROKE? A stroke happens when part of your brain is not getting enough blood and stops working. Depending on the part of the brain that is damaged, a stroke can cause:  Sudden weakness or numbness of your face, arm, or leg on one side of your body.  Sudden confusion, trouble talking, or trouble  understanding.  Sudden dizziness, loss of balance, or trouble walking.  Sudden trouble seeing in one or both eyes or sudden double vision.  Sudden severe headache. Sometimes, one or more of these warning signs may happen and then disappear. You might be having a "mini-stroke," also called a TIA (transient ischemic attack). If you have any of these warning signs, tell your caregiver right away. HOW CAN CLOGGED BLOOD VESSELS HURT MY LEGS AND FEET? Peripheral vascular disease can happen when the openings in your blood vessels become narrow and not enough blood gets to your legs and feet. You may feel pain in your buttocks, the back of your legs, or your thighs when you stand, walk, or exercise. Sometimes, surgery is necessary to treat this problem. WHAT CAN I DO TO KEEP MY BLOOD VESSELS HEALTHY AND PREVENT HEART DISEASE AND STROKE?  Keep your blood glucose under control. An A1c blood test will probably be ordered by your caregiver at least twice a year. The A1c test tells you your average blood glucose for the past 2 to 3 months and can give valuable information on the overall control of your diabetes.  Keep your blood pressure under control. Have it checked at every health care visit. If you are on medication to control blood pressure, take it exactly as prescribed. The target for most people is below 130/80.  Keep your cholesterol under control. Have it checked at least once a year. The targets for most people are:  LDL (bad) cholesterol: below 100 mg/dl.  HDL (good) cholesterol: above 40 mg/dl in men and above 50 mg/dl in women.  Triglycerides (another type of fat in the blood): below 150 mg/dl.  Make physical activity a part of your daily routine. Aim for at least 30 minutes of exercise most days of the week. Check with your caregiver to learn what activities are best for you.  Make sure that the foods you eat are "heart-healthy." Include foods high in fiber, such as oat bran, oatmeal,  whole-grain breads and cereals, fruits, and vegetables. Cut back on foods high in saturated fat or cholesterol, such as meats, butter, dairy products with fat, eggs, shortening, lard, and foods with palm oil or coconut oil.  Maintain a healthy weight. If you are overweight, try to exercise most days of the week. See a registered dietitian for help in planning meals and lowering the fat and calorie content.  If you smoke, QUIT. Your caregiver can tell you about ways to help you quit smoking.  Ask your caregiver whether you should take an aspirin every day. Studies have shown that taking a low dose of aspirin every day can help reduce your risk of heart disease and stroke.  Take your medicines as directed. SEEK MEDICAL CARE IF:   You have any of the warning signs of a heart attack or stroke.  If you have pain in your legs or feet when walking.  If your feet and legs are cool or cold to the touch. FOR MORE INFORMATION     Diabetes educators (nurses, dietitians, pharmacists, and other health professionals).  To find a diabetes educator near you, call the American Association of Diabetes Educators (AADE) toll-free at 1-800-TEAMUP4 (1-800-832-6874), or look on the Internet at www.diabeteseducator.org and click on "Find a Diabetes Educator."  Dietitians.  To find a dietitian near you, call the American Dietetic Association toll-free at 1-800-366-1655, or look on the Internet at www.eatright.org and click on "Find a Nutrition Professional."  Government.  The National Heart, Lung, and Blood Institute (NHLBI) is part of the National Institutes of Health. To learn more about heart and blood vessel problems, write or call NHLBI Information Center, [address] [address], [place] [address], (301) 592-8573; or see www.nhlbi.nih.gov on the Internet.  To get more information about taking care of diabetes, contact: National Diabetes Information Clearinghouse [address] [place]  [address] Phone: 1-800-860-8747 or (301) 654-3327 Fax: [phone] Email: ndic@info.niddk.nih.gov Internet: www.diabetes.niddk.nih.gov National Diabetes Education Program [address] [place] [address] Phone: 1-800-438-5383 Fax: (301) [phone] Internet: http://ndep.nih.gov American Diabetes Association [address] [place] [address] Phone: 1-800-342-2383 Internet: www.diabetes.org [employer] [facility] [address] [place] [address] Phone: 1-800-533-2873 Internet: www.jdrf.org Document Released: 09/10/2003 Document Revised: 11/30/2011 Document Reviewed: 02/15/2009 ExitCare Patient Information 2013 [employer].  

## 2012-09-10 ENCOUNTER — Telehealth: Payer: Self-pay | Admitting: *Deleted

## 2012-09-10 NOTE — Telephone Encounter (Signed)
[phone] Left voice message to inform the patient that I am needing to reschedule her appoointment for 10-13-2012

## 2012-09-13 ENCOUNTER — Telehealth: Payer: Self-pay | Admitting: *Deleted

## 2012-09-13 NOTE — Telephone Encounter (Signed)
former patient of dr.rubin re-establishing with dr.lisa thomas / [facility]

## 2012-09-21 NOTE — L&D Delivery Note (Signed)
Delivery Note Pt pushed well and at 4:38 AM a viable female was delivered via Vaginal, Spontaneous Delivery (Presentation: Left OA).  APGAR: 5, 8; weight 7 lb 14.8 oz (3595 g).  Shoulders were tight requiring McRobert's and suprapubic but released without difficulty once these maneuvers were used. Nuchal cord x 1 reduced prior to delivery. Cord quickly clamped and cut and infant to warmer for stimulation; peds called to eval. Placenta status: spont, intact.  Cord: 3 vessels with the following complications: subjectively longer than 30cm.  Anesthesia: Epidural  Episiotomy: none Lacerations: none Est. Blood Loss (mL): 250cc  Mom to postpartum.  Baby to Couplet care / Skin to Skin by the time CNM leaving the room.   Cam Hai 09/02/2013, 4:59 AM

## 2012-09-24 ENCOUNTER — Encounter: Payer: Self-pay | Admitting: Oncology

## 2012-10-13 ENCOUNTER — Other Ambulatory Visit: Payer: Medicaid Other | Admitting: Lab

## 2012-10-13 ENCOUNTER — Ambulatory Visit: Payer: Medicaid Other | Admitting: Oncology

## 2012-10-17 ENCOUNTER — Other Ambulatory Visit (HOSPITAL_BASED_OUTPATIENT_CLINIC_OR_DEPARTMENT_OTHER): Payer: Medicaid Other | Admitting: Lab

## 2012-10-17 ENCOUNTER — Ambulatory Visit: Payer: Medicaid Other

## 2012-10-17 ENCOUNTER — Ambulatory Visit (HOSPITAL_BASED_OUTPATIENT_CLINIC_OR_DEPARTMENT_OTHER): Payer: Medicaid Other | Admitting: Nurse Practitioner

## 2012-10-17 ENCOUNTER — Telehealth: Payer: Self-pay | Admitting: Oncology

## 2012-10-17 VITALS — BP 128/70 | HR 89 | Temp 97.8°F | Resp 22 | Ht 61.0 in | Wt 277.7 lb

## 2012-10-17 VITALS — BP 123/85 | HR 79 | Temp 98.4°F

## 2012-10-17 DIAGNOSIS — C9200 Acute myeloblastic leukemia, not having achieved remission: Secondary | ICD-10-CM

## 2012-10-17 DIAGNOSIS — M25559 Pain in unspecified hip: Secondary | ICD-10-CM

## 2012-10-17 DIAGNOSIS — C9590 Leukemia, unspecified not having achieved remission: Secondary | ICD-10-CM

## 2012-10-17 DIAGNOSIS — C9240 Acute promyelocytic leukemia, not having achieved remission: Secondary | ICD-10-CM

## 2012-10-17 LAB — CBC WITH DIFFERENTIAL/PLATELET
BASO%: 0.3 % (ref 0.0–2.0)
Basophils Absolute: 0 10e3/uL (ref 0.0–0.1)
EOS%: 1.9 % (ref 0.0–7.0)
Eosinophils Absolute: 0.1 10e3/uL (ref 0.0–0.5)
HCT: 33.5 % — ABNORMAL LOW (ref 34.8–46.6)
HGB: 11.3 g/dL — ABNORMAL LOW (ref 11.6–15.9)
LYMPH%: 37.2 % (ref 14.0–49.7)
MCH: 28 pg (ref 25.1–34.0)
MCHC: 33.7 g/dL (ref 31.5–36.0)
MCV: 82.9 fL (ref 79.5–101.0)
MONO#: 0.4 10e3/uL (ref 0.1–0.9)
MONO%: 6.2 % (ref 0.0–14.0)
NEUT#: 3.8 10e3/uL (ref 1.5–6.5)
NEUT%: 54.4 % (ref 38.4–76.8)
Platelets: 244 10e3/uL (ref 145–400)
RBC: 4.04 10e6/uL (ref 3.70–5.45)
RDW: 13.7 % (ref 11.2–14.5)
WBC: 6.9 10e3/uL (ref 3.9–10.3)
lymph#: 2.6 10e3/uL (ref 0.9–3.3)
nRBC: 0 % (ref 0–0)

## 2012-10-17 LAB — COMPREHENSIVE METABOLIC PANEL (CC13)
AST: 17 U/L (ref 5–34)
Albumin: 4.1 g/dL (ref 3.5–5.0)
Alkaline Phosphatase: 88 U/L (ref 40–150)
Potassium: 4 mEq/L (ref 3.5–5.1)
Sodium: 140 mEq/L (ref 136–145)
Total Bilirubin: 0.38 mg/dL (ref 0.20–1.20)
Total Protein: 7.4 g/dL (ref 6.4–8.3)

## 2012-10-17 LAB — LACTATE DEHYDROGENASE (CC13): LDH: 159 U/L (ref 125–245)

## 2012-10-17 MED ORDER — HEPARIN SOD (PORK) LOCK FLUSH 100 UNIT/ML IV SOLN
500.0000 [IU] | Freq: Once | INTRAVENOUS | Status: AC
  Administered 2012-10-17: 500 [IU] via INTRAVENOUS
  Filled 2012-10-17: qty 5

## 2012-10-17 MED ORDER — SODIUM CHLORIDE 0.9 % IJ SOLN
10.0000 mL | INTRAMUSCULAR | Status: DC | PRN
  Administered 2012-10-17: 10 mL via INTRAVENOUS
  Filled 2012-10-17: qty 10

## 2012-10-17 NOTE — Telephone Encounter (Signed)
appts made and printed for pt aom °

## 2012-10-17 NOTE — Progress Notes (Signed)
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 She was noted to be pancytopenic with hemoglobin 7.8, white count 2.9, absolute neutrophil count 0.4 and platelet count 33,000. The peripheral blood smear was concerning for blasts. She was transferred to [facility] for treatment of probable acute leukemia.  She was diagnosed with acute promyelocytic leukemia and started on induction therapy with ATRA and arsenic. Recovery bone marrow done on 12/23/2011 showed no morphologic evidence of APL but FISH returned positive for  t(15;17) in 60.5% of cells and cytogenetic analysis also revealed t(15;17). She completed consolidation therapy with arsenic/ATRA beginning 12/28/2011 under the direction of Dr. Pierce Crane. She completed treatment September/October 2013.  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  Most recent PML/RAR translocation by PCR done 07/21/2012 returned at 0.000.  She is seen today to transition care to Dr. Cyndie Chime.  She reports subjective fevers over the past one to 2 weeks. She has lost her thermometer so no fever has been documented. She has been having sweats/hot flashes over the past 2-3 months. No anorexia or weight loss. No unusual headaches or vision change. No shortness of breath or cough. She denies chest pain. No nausea or vomiting.  No abdominal pain. No diarrhea or constipation. No hematuria or dysuria. She denies any numbness or tingling in her hands or feet. She reports chronic pain at the left hip related to "bursitis". She takes oxycodone every 4 hours but ran out one to 2 months ago. She requests a new prescription today. She states she has not been evaluated by orthopedics but has an upcoming appointment.  Medication list was reviewed. She reports an allergy to tramadol with nausea/vomiting.  Her mother is 46 with hypertension. Father age 25 with diabetes. She has 2 half-sisters in good health. One sister has ADHD. She reports a significant family history of malignancy on her mothers side. She thinks her maternal great-grandmother had breast cancer. There have also been diagnoses of pancreatic cancer and lung cancer.  She lives in [place]. She is engaged. She has one daughter who is 9 years old and in good health. She was previously employed as a Agricultural engineer. She is currently not working. She denies tobacco or alcohol use.  Objective: Blood pressure 128/70, pulse 89, temperature 97.8 F (36.6 C), temperature source Oral, resp. rate 22, height 5\' 1"  (1.549 m), weight 277 lb 11.2 oz (125.964 kg).  Oropharynx is without thrush or ulceration. No palpable cervical, supraclavicular or axillary lymph nodes. Lungs are clear. No wheezes or rales. Regular cardiac rhythm. Port-A-Cath site is without erythema. Abdomen is soft, obese. No obvious organomegaly. Extremities without edema. Motor strength 5 over 5. Knee DTRs 2+, symmetric.  Lab Results: Lab Results  Component Value Date   WBC 6.9 10/17/2012   HGB 11.3* 10/17/2012   HCT 33.5* 10/17/2012   MCV 82.9 10/17/2012   PLT 244 10/17/2012    Chemistry:    Chemistry      Component Value Date/Time   NA 139 07/21/2012 1459  NA 132* 05/12/2012 0835   K 4.1 07/21/2012 1459   K 4.2 05/12/2012 0835   CL 108* 07/21/2012 1459   CL 95* 05/12/2012 0835   CO2 20* 07/21/2012  1459   CO2 20 05/12/2012 0835   BUN 16.0 07/21/2012 1459   BUN 31* 05/12/2012 0835   CREATININE 0.9 07/21/2012 1459   CREATININE 0.64 05/12/2012 0835   GLU 479* 03/01/2012 1210      Component Value Date/Time   CALCIUM 10.5* 07/21/2012 1459   CALCIUM 10.4 05/12/2012 0835   ALKPHOS 80 07/21/2012 1459   ALKPHOS 79 05/12/2012 0835   AST 26 07/21/2012 1459   AST 12 05/12/2012 0835   ALT 36 07/21/2012 1459   ALT 16 05/12/2012 0835   BILITOT 0.88 07/21/2012 1459   BILITOT 0.6 05/12/2012 0835       Studies/Results: No results found.  Medications: I have reviewed the patient's current medications.  Assessment/Plan:  1. APL diagnosed March 2013 status post induction therapy with ATRA/Arsenic followed by consolidation with all therapy complete by September/October 2013. Most recent PML/RAR translocation by PCR done 07/21/2012 returned at 0.000. 2. Diabetes mellitus. 3. Hypertension. 4. Chronic left hip pain. She has an upcoming appointment with orthopedics. We deferred pain management issues to her primary care provider and/or orthopedics.  Disposition-Melissa Washington appears stable from a hematologic standpoint. We will followup on the PML/RAR by PCR done earlier today. We will have her return for CBCs on a 4 week schedule. She will return for a followup visit in 4 months. She will contact the office in the interim with any problems.  Patient was seen with Dr. Cyndie Chime.  Lonna Cobb ANP/GNP-BC

## 2012-10-18 NOTE — Progress Notes (Signed)
Hematology and Oncology Follow Up Visit  Melissa Washington [id number] 24-Jul-1986 26 y.o. 06/17/12  DIAGNOSIS:   Encounter Diagnosis  Name Primary?  . APML (acute promyelocytic leukemia) Yes     PAST THERAPY:  S/p 5 cycles of ATRA/arsenic completed 05/13/12. Here for f/u.  Hx of type 2 DM on insulin  Interim History:   Since completion of chemotherapy she has been having joint and back pains requiring medication including ultram which was not helpful. She has since been taking oxycodone with some relief. Most of this sounds musculoskeletal in origin. She denies any B symptoms.   Medications: I have reviewed the patient's current medications.  Allergies:  Allergies  Allergen Reactions  . Ultram (Tramadol Hcl) Nausea And Vomiting    Past Medical History, Surgical history, Social history, and Family History were reviewed and updated.  Review of Systems: Constitutional:  Negative for fever, chills, night sweats, anorexia, weight loss, pain. Cardiovascular: no chest pain or dyspnea on exertion Respiratory: no cough, shortness of breath, or wheezing Neurological: negative Dermatological: negative ENT: negative Skin Gastrointestinal: no abdominal pain, change in bowel habits, or black or bloody stools Genito-Urinary: no dysuria, trouble voiding, or hematuria Hematological and Lymphatic: negative Breast: negative Musculoskeletal: positive for - joint pain Remaining ROS negative.  Physical Exam:  Blood pressure 160/90, pulse 73, temperature 98.6 F (37 C), temperature source Oral, resp. rate 20, height 5\' 2"  (1.575 m), weight 265 lb 12.8 oz (120.566 kg).  ECOG: 0  HEENT:  Sclerae anicteric, conjunctivae pink.  Oropharynx clear.  No mucositis or candidiasis.  Nodes:  No cervical, supraclavicular, or axillary lymphadenopathy palpated.  Breast Exam:  Right breast is benign.  No masses, discharge, skin change, or nipple inversion.  Left breast is benign.  No masses, discharge,  skin change, or nipple inversion..  Lungs:  Clear to auscultation bilaterally.  No crackles, rhonchi, or wheezes.  Heart:  Regular rate and rhythm.  Abdomen:  Soft, nontender.  Positive bowel sounds.  No organomegaly or masses palpated.  Musculoskeletal:  No focal spinal tenderness to palpation.  Extremities:  Benign.  No peripheral edema or cyanosis.  Skin:  Benign.  Neuro:  Nonfocal.     Lab Results: Lab Results  Component Value Date   WBC 6.9 10/17/2012   HGB 11.3* 10/17/2012   HCT 33.5* 10/17/2012   MCV 82.9 10/17/2012   PLT 244 10/17/2012     Chemistry      Component Value Date/Time   NA 140 10/17/2012 1016   NA 132* 05/12/2012 0835   K 4.0 10/17/2012 1016   K 4.2 05/12/2012 0835   CL 105 10/17/2012 1016   CL 95* 05/12/2012 0835   CO2 23 10/17/2012 1016   CO2 20 05/12/2012 0835   BUN 17.2 10/17/2012 1016   BUN 31* 05/12/2012 0835   CREATININE 0.9 10/17/2012 1016   CREATININE 0.64 05/12/2012 0835   GLU 479* 03/01/2012 1210      Component Value Date/Time   CALCIUM 9.7 10/17/2012 1016   CALCIUM 10.4 05/12/2012 0835   ALKPHOS 88 10/17/2012 1016   ALKPHOS 79 05/12/2012 0835   AST 17 10/17/2012 1016   AST 12 05/12/2012 0835   ALT 32 10/17/2012 1016   ALT 16 05/12/2012 0835   BILITOT 0.38 10/17/2012 1016   BILITOT 0.6 05/12/2012 0835       Radiological Studies:  No results found.   IMPRESSIONS AND PLAN: A 27 y.o. female with   Hx of APML s/p completion of chemotherapy (  arsenic/atra); results of RARA transcripts studies are pending.  Her diabetic control is poor and I have encouraged her to f/u with her primary care.  We will do some xrays of her back.. I am unclear as to the etiology of her pain. She is on depot provera and this will be continued. F/u in 2 months.  Spent more than half the time coordinating care, as well as discussion of BMI and its implications.      Melissa Washington 1/28/20142:10 PM Cell [phone]

## 2012-10-18 NOTE — Progress Notes (Signed)
[facility] today.  Letter mailed to patient.  

## 2012-10-22 LAB — PML/RAR TRANSLOCATION BY PCR: PML/RAR t(15;17) Translocation PCR: 0 ratio

## 2012-10-25 ENCOUNTER — Other Ambulatory Visit (HOSPITAL_COMMUNITY)
Admission: RE | Admit: 2012-10-25 | Discharge: 2012-10-25 | Disposition: A | Discharge status: Home / Self Care | Payer: Medicaid Other | Source: Ambulatory Visit | Attending: Family Medicine | Admitting: Family Medicine

## 2012-10-25 ENCOUNTER — Ambulatory Visit (HOSPITAL_COMMUNITY): Payer: Medicaid Other | Attending: Family Medicine

## 2012-10-25 ENCOUNTER — Ambulatory Visit (INDEPENDENT_AMBULATORY_CARE_PROVIDER_SITE_OTHER): Payer: Medicaid Other | Admitting: Family Medicine

## 2012-10-25 ENCOUNTER — Telehealth: Payer: Self-pay | Admitting: Family Medicine

## 2012-10-25 VITALS — BP 133/82 | HR 105 | Temp 99.2°F | Ht 62.0 in | Wt 277.0 lb

## 2012-10-25 DIAGNOSIS — Z113 Encounter for screening for infections with a predominantly sexual mode of transmission: Secondary | ICD-10-CM | POA: Insufficient documentation

## 2012-10-25 DIAGNOSIS — Z6841 Body Mass Index (BMI) 40.0 and over, adult: Secondary | ICD-10-CM

## 2012-10-25 DIAGNOSIS — R109 Unspecified abdominal pain: Secondary | ICD-10-CM

## 2012-10-25 DIAGNOSIS — N898 Other specified noninflammatory disorders of vagina: Secondary | ICD-10-CM

## 2012-10-25 DIAGNOSIS — N912 Amenorrhea, unspecified: Secondary | ICD-10-CM

## 2012-10-25 DIAGNOSIS — R300 Dysuria: Secondary | ICD-10-CM

## 2012-10-25 DIAGNOSIS — N760 Acute vaginitis: Secondary | ICD-10-CM

## 2012-10-25 LAB — POCT WET PREP (WET MOUNT)

## 2012-10-25 LAB — POCT URINALYSIS DIPSTICK
Ketones, UA: NEGATIVE
Nitrite, UA: NEGATIVE
Protein, UA: NEGATIVE
Urobilinogen, UA: 0.2

## 2012-10-25 LAB — POCT URINE PREGNANCY: Preg Test, Ur: NEGATIVE

## 2012-10-25 LAB — POCT UA - MICROSCOPIC ONLY

## 2012-10-25 MED ORDER — FLUCONAZOLE 150 MG PO TABS: 150.0000 mg | ORAL_TABLET | Freq: Once | ORAL | Status: DC

## 2012-10-25 MED ORDER — PROGESTERONE MICRONIZED 200 MG PO CAPS: 400.0000 mg | ORAL_CAPSULE | Freq: Every day | ORAL | Status: DC

## 2012-10-25 MED ORDER — METRONIDAZOLE 500 MG PO TABS: 500.0000 mg | ORAL_TABLET | Freq: Two times a day (BID) | ORAL | Status: DC

## 2012-10-25 NOTE — Assessment & Plan Note (Addendum)
-   Wet prep: Many clues, 3+ bacteria, moderate yeast; Positive for BV and yeast. - Diflucan 150 mg and flagyl 500 mg BID x5 days prescribed today - Chlamydia and Gonorrhea cultures pending --> will inform patient of results and treat accordingly - Last PAP 04/2012 was WNL - F/U: as needed

## 2012-10-25 NOTE — Assessment & Plan Note (Addendum)
-   Urine pregnancy negative today - With complicated history of uncontrolled diabetes, there is a strong possibility her amenorrhea could be PCOS related. Patient has been noncompliant with her diabetes treatment and weight, with exhaustive discussions of the importance of her taking care of her body.  - [place] today of pelvis (complete, with transvaginal and abdominal approach) looking for uterine/ovary diagnosis and r/o PID for fever/left sided abd pain. Low suspicions of PID at this time d/t stability of relationship and only 1 day of subjective fever. - Probable secondary to discontinuation of BCP last august. Prescribed progesterone 400 mg daily for 10 days in hopes of producing a withdrawal bleed. Patient is advised to expect a period 1-4 days after the completion of this medication and to inform this office.  - TSH/FSH/LH obtained today - F/U: PRN or sooner if needed pending results of tests.  - PATIENT NO SHOWED FOR SCHEDULED SAME DAY U/S*

## 2012-10-25 NOTE — Progress Notes (Signed)
Subjective:     Patient ID: Melissa Washington, female   DOB: 10/18/1985, 27 y.o.   MRN: [id number]  HPI Vaginitis: Patient has complaints of vaginal irritation for 3 days with "pressure" type pain in her abdomen to the point she cannot lay on her stomach without discomfort. She has uncontrolled diabetes, with frequent yeast infections. She feels as if she has been feverish x 1day, but has not taken her temperature. She has has unprotected sex with her partner of a few years. She has noticed no foul odor, but has had a "yeasty" discharge. Her last LMP was 04/2012 after she discontinue her BCP. Her PAP at that time was WNL. She has been checked multiple times for STD and infections.   Amenorrhea: In addition to the above history, patient has stopped having periods after she discontinued her BCP in August. She had been undergoing chemo for her APML at that time. She would like to eventually become pregnant and does not desire BC currently. Her diabetes is always out uncontrolled and she has little interest to control it. She is experiencing abdominal pain as stated above.    Review of Systems  Constitutional: Positive for fever and appetite change. Negative for chills, activity change, fatigue and unexpected weight change.  Genitourinary: Negative for dysuria, flank pain and difficulty urinating.  All other systems reviewed and are negative.       Objective:   Physical Exam BP 133/82  Pulse 105  Temp 99.2 F (37.3 C) (Oral)  Ht 5\' 2"  (1.575 m)  Wt 277 lb (125.646 kg)  BMI 50.66 kg/m2 Gen: Obese female. Pleasant. NAD Abd: Soft. Obese. ND. TTP Left side, mostly LLQ and left periumbilical. BS+. No HSM, although difficult to evaluate d/t size.  GU: External genitalia appears normal. Mild erythema on bilateral medial thighs. Clearer than her usual appearance.  Vagina positive for white thin discharge. Mild odor. Normal rugae and  Appearance. Cervix: WNL. No cervical motion tenderness noted.  Tenderness to abdominal palpation on left side of uterus, possible ovary enlargement (difficult to assess d/t size).

## 2012-10-25 NOTE — Telephone Encounter (Signed)
Patient took the medication that was prescribed and fell asleep and has missed her Ultrasound appt and is asking for it to be rescheduled.

## 2012-10-26 LAB — TSH: TSH: 1.415 u[IU]/mL (ref 0.350–4.500)

## 2012-10-27 NOTE — Telephone Encounter (Signed)
Spoke with patient and gave her number so that she can call a reschedule that appointment. Also she was wanting to see if she can get more medication for yeast infection, says that what she is on is not helping

## 2012-10-27 NOTE — Telephone Encounter (Signed)
Related message,pt voiced understanding. Melissa Washington S  

## 2012-10-27 NOTE — Telephone Encounter (Signed)
-   Patient needs to wait 5-7 days for medication to take affect for her yeast infection. If it is not better thyen she can call and I will call in another dose of diflucan. Please make her aware.

## 2012-10-31 ENCOUNTER — Ambulatory Visit (HOSPITAL_COMMUNITY)
Admission: RE | Admit: 2012-10-31 | Discharge: 2012-10-31 | Disposition: A | Discharge status: Home / Self Care | Payer: Medicaid Other | Source: Ambulatory Visit | Attending: Family Medicine | Admitting: Family Medicine

## 2012-10-31 DIAGNOSIS — R109 Unspecified abdominal pain: Secondary | ICD-10-CM

## 2012-10-31 DIAGNOSIS — R509 Fever, unspecified: Secondary | ICD-10-CM | POA: Insufficient documentation

## 2012-10-31 DIAGNOSIS — N949 Unspecified condition associated with female genital organs and menstrual cycle: Secondary | ICD-10-CM | POA: Insufficient documentation

## 2012-10-31 DIAGNOSIS — R1032 Left lower quadrant pain: Secondary | ICD-10-CM | POA: Insufficient documentation

## 2012-10-31 DIAGNOSIS — N912 Amenorrhea, unspecified: Secondary | ICD-10-CM | POA: Insufficient documentation

## 2012-10-31 DIAGNOSIS — N83209 Unspecified ovarian cyst, unspecified side: Secondary | ICD-10-CM | POA: Insufficient documentation

## 2012-11-01 ENCOUNTER — Encounter: Payer: Self-pay | Admitting: Family Medicine

## 2012-11-07 ENCOUNTER — Telehealth: Payer: Self-pay | Admitting: Family Medicine

## 2012-11-07 NOTE — Telephone Encounter (Signed)
Covering for Dr. Claiborne Billings, who is on vacation. This pt's ultrasound was normal and did not show an explanation for her pain. Attempted to call pt x1 but there was no answer. Did not leave VM.  To [facility] white team - can you call this pt back tomorrow and let her know her ultrasound was normal? If she is still having symptoms or concerns she should schedule a follow-up visit to be seen in clinic. Thanks!

## 2012-11-07 NOTE — Telephone Encounter (Signed)
Wants to know if her doctor has gotten results of her [place] from last week

## 2012-11-10 NOTE — Telephone Encounter (Signed)
Left message on voicemail.request a rtn call to schedule an appt if still not well or if still has question. Brendaliz Kuk, Virgel Bouquet

## 2012-11-14 ENCOUNTER — Other Ambulatory Visit: Payer: Medicaid Other

## 2012-11-14 NOTE — Telephone Encounter (Signed)
A letter went out to this patient with lab and ultrasound results on 11/01/2012. Please make sure we have correct address for her, it sounds as if she has not received this letter since she is calling in asking for her results. I also asked the patient to make an appointment with me for follow-up and discuss her results and amenorrhea after she completed the 10d of medication, she has not done so to my knowledge. Please call her to have her come in for an appointment to go over in detail her results and discuss further treatment that may be necessary depending on if she has experienced a period after medication.

## 2012-11-15 NOTE — Telephone Encounter (Signed)
Pt states she has received letter and has scheduled an appt to be seen this Friday. Afshin Chrystal, Virgel Bouquet

## 2012-11-16 ENCOUNTER — Other Ambulatory Visit: Payer: Medicaid Other | Admitting: Lab

## 2012-11-18 ENCOUNTER — Ambulatory Visit (INDEPENDENT_AMBULATORY_CARE_PROVIDER_SITE_OTHER): Payer: Medicaid Other | Admitting: Family Medicine

## 2012-11-18 ENCOUNTER — Encounter: Payer: Self-pay | Admitting: Family Medicine

## 2012-11-18 VITALS — BP 146/85 | HR 88 | Ht 62.0 in | Wt 270.0 lb

## 2012-11-18 DIAGNOSIS — C9200 Acute myeloblastic leukemia, not having achieved remission: Secondary | ICD-10-CM

## 2012-11-18 DIAGNOSIS — I10 Essential (primary) hypertension: Secondary | ICD-10-CM

## 2012-11-18 DIAGNOSIS — N912 Amenorrhea, unspecified: Secondary | ICD-10-CM

## 2012-11-18 DIAGNOSIS — C9240 Acute promyelocytic leukemia, not having achieved remission: Secondary | ICD-10-CM

## 2012-11-18 DIAGNOSIS — E119 Type 2 diabetes mellitus without complications: Secondary | ICD-10-CM

## 2012-11-18 DIAGNOSIS — E1165 Type 2 diabetes mellitus with hyperglycemia: Secondary | ICD-10-CM

## 2012-11-18 LAB — POCT URINE PREGNANCY: Preg Test, Ur: NEGATIVE

## 2012-11-18 MED ORDER — LORATADINE 10 MG PO TABS: 10.0000 mg | ORAL_TABLET | Freq: Every day | ORAL | Status: DC

## 2012-11-18 MED ORDER — METFORMIN HCL 1000 MG PO TABS: 1000.0000 mg | ORAL_TABLET | Freq: Two times a day (BID) | ORAL | Status: DC

## 2012-11-18 MED ORDER — PROGESTERONE MICRONIZED 200 MG PO CAPS: 400.0000 mg | ORAL_CAPSULE | Freq: Every day | ORAL | Status: DC

## 2012-11-18 MED ORDER — LISINOPRIL 10 MG PO TABS: 10.0000 mg | ORAL_TABLET | Freq: Every day | ORAL | Status: DC

## 2012-11-18 MED ORDER — INSULIN GLARGINE 100 UNIT/ML ~~LOC~~ SOLN: 35.0000 [IU] | Freq: Every day | SUBCUTANEOUS | Status: DC

## 2012-11-18 NOTE — Patient Instructions (Signed)
Gestational Diabetes Mellitus Gestational diabetes mellitus (GDM) is diabetes that occurs only during pregnancy. This happens when the body cannot properly handle the glucose (sugar) that increases in the blood after eating. During pregnancy, insulin resistance (reduced sensitivity to insulin) occurs because of the release of hormones from the placenta. Usually, the pancreas of pregnant women produces enough insulin to overcome the resistance that occurs. However, in gestational diabetes, the insulin is there but it does not work effectively. If the resistance is severe enough that the pancreas does not produce enough insulin, extra glucose builds up in the blood.  WHO IS AT RISK FOR DEVELOPING GESTATIONAL DIABETES?  Women with a history of diabetes in the family.  Women over age 25.  Women who are overweight.  Women in certain ethnic groups (Hispanic, African American, Native American, Asian and Pacific Islander). WHAT CAN HAPPEN TO THE BABY? If the mother's blood glucose is too high while she is pregnant, the extra sugar will travel through the umbilical cord to the baby. Some of the problems the baby may have are:  Large Baby - If the baby receives too much sugar, the baby will gain more weight. This may cause the baby to be too large to be born normally (vaginally) and a Cesarean section (C-section) may be needed.  Low Blood Glucose (hypoglycemia)  The baby makes extra insulin, in response to the extra sugar its gets from its mother. When the baby is born and no longer needs this extra insulin, the baby's blood glucose level may drop.  Jaundice (yellow coloring of the skin and eyes)  This is fairly common in babies. It is caused from a build-up of the chemical called bilirubin. This is rarely serious, but is seen more often in babies whose mothers had gestational diabetes. RISKS TO THE MOTHER Women who have had gestational diabetes may be at higher risk for some problems,  including:  Preeclampsia or toxemia, which includes problems with high blood pressure. Blood pressure and protein levels in the urine must be checked frequently.  Infections.  Cesarean section (C-section) for delivery.  Developing Type 2 diabetes later in life. About 30-50% will develop diabetes later, especially if obese. DIAGNOSIS  The hormones that cause insulin resistance are highest at about 24-28 weeks of pregnancy. If symptoms are experienced, they are much like symptoms you would normally expect during pregnancy.  GDM is often diagnosed using a two part method: 1. After 24-28 weeks of pregnancy, the woman drinks a glucose solution and takes a blood test. If the glucose level is high, a second test will be given. 2. Oral Glucose Tolerance Test (OGTT) which is 3 hours long  After not eating overnight, the blood glucose is checked. The woman drinks a glucose solution, and hourly blood glucose tests are taken. If the woman has risk factors for GDM, the caregiver may test earlier than 24 weeks of pregnancy. TREATMENT  Treatment of GDM is directed at keeping the mother's blood glucose level normal, and may include:  Meal planning.  Taking insulin or other medicine to control your blood glucose level.  Exercise.  Keeping a daily record of the foods you eat.  Blood glucose monitoring and keeping a record of your blood glucose levels.  May monitor ketone levels in the urine, although this is no longer considered necessary in most pregnancies. HOME CARE INSTRUCTIONS  While you are pregnant:  Follow your caregiver's advice regarding your prenatal appointments, meal planning, exercise, medicines, vitamins, blood and other tests, and physical   activities.  Keep a record of your meals, blood glucose tests, and the amount of insulin you are taking (if any). Show this to your caregiver at every prenatal visit.  If you have GDM, you may have problems with hypoglycemia (low blood glucose).  You may suspect this if you become suddenly dizzy, feel shaky, and/or weak. If you think this is happening and you have a glucose meter, try to test your blood glucose level. Follow your caregiver's advice for when and how to treat your low blood glucose. Generally, the 15:15 rule is followed: Treat by consuming 15 grams of carbohydrates, wait 15 minutes, and recheck blood glucose. Examples of 15 grams of carbohydrates are:  1 cup skim or low-fat milk.   cup juice.  3-4 glucose tablets.  5-6 hard candies.  1 small box raisins.   cup regular soda pop.  Practice good hygiene, to avoid infections.  Do not smoke. SEEK MEDICAL CARE IF:   You develop abnormal vaginal discharge, with or without itching.  You become weak and tired more than expected.  You seem to sweat a lot.  You have a sudden increase in weight, 5 pounds or more in one week.  You are losing weight, 3 pounds or more in a week.  Your blood glucose level is high, and you need instructions on what to do about it. SEEK IMMEDIATE MEDICAL CARE IF:   You develop a severe headache.  You faint or pass out.  You develop nausea and vomiting.  You become disoriented or confused.  You have a convulsion.  You develop vision problems.  You develop stomach pain.  You develop vaginal bleeding.  You develop uterine contractions.  You have leaking or a gush of fluid from the vagina. AFTER YOU HAVE THE BABY:  Go to all of your follow-up appointments, and have blood tests as advised by your caregiver.  Maintain a healthy lifestyle, to prevent diabetes in the future. This includes:  Following a healthy meal plan.  Controlling your weight.  Getting enough exercise and proper rest.  Do not smoke.  Breastfeed your baby if you can. This will lower the chance of you and your baby developing diabetes later in life. For more information about diabetes, go to the American Diabetes Association at:  www.americandiabetesassociation.org. For more information about gestational diabetes, go to the American Congress of Obstetricians and Gynecologists at: www.acog.org. Document Released: 12/14/2000 Document Revised: 11/30/2011 Document Reviewed: 07/08/2009 [employer] Patient Information 2013 [employer].  

## 2012-11-18 NOTE — Progress Notes (Signed)
Subjective:     Patient ID: Melissa Washington, female   DOB: February 27, 1986, 27 y.o.   MRN: [id number]  HPI Amenorrhea Follow-up: Patient has not had a period since quitting BCP in 04/2012. She was given a progesterone trial of 10 days and admits to not adhering to plan and missing a few doses. She did not experience a menstrual period. She has experienced abdominal pain once (today) since her last visit. The pain lasted 10 minutes and she described it as sharp 8/10 pain that self resolved. She admits to non-bloody loose stools over the last 3-4 weeks, but does admit to regular stools as well during this period. She denies problems with constipation. Her appetite has remained normal without nausea or vomit. Transvaginal ultrasound from last appointment reveled small simple  left ovarian cyst (3cm) without evidence of pelvic mass or other significant abnormality. Yeast and BV have resolved since last visit with medications.  Patient desires to become pregnant, and partner (which is present) feels the same way.   Pregnancy counseling:  Patient and partner desire to become pregnant, patient is a non-compliant diabetic. Her glucose ranges, when completed by lab are extremely high. She admits to being without her medications for over a month because she moved and they were placed in storage. Of note, she did have remaining refills for her medications, but did not attempt to pick up new medications. She has not checked her blood sugars since I have met her, although it has obviously been requested. 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    Diabetes: Please see above notes. In addition patient admits to increase in thirst and frequency of urine, along with occasional loose stools. Admits to being without medications for over a month and does not know if her glucose monitor even works. She believes she has strips and necessary supplies, and will will attempt to get  these in order. Her last A1C was in 08/2012 and was 8.6 on medications. She is nonadherent to lifestyle changes, although a health coach had been implemented in her care last year.    Review of Systems See above HPI 7 lbs weight loss, in 3 weeks    Objective:   Physical Exam BP 146/85  Pulse 88  Ht 5\' 2"  (1.575 m)  Wt 270 lb (122.471 kg)  BMI 49.37 kg/m2 GEN: Morbidly obese female. NAD, smiling and pleasant. HENT: mild acne present, with female pattern hair growth on face (mild). Neck is supple. No LAD. No thyroidmegaly.  Acanthosis Migrans present.   CV: RRR. No murmur noted. Lungs: CTAB ABD: Obese. Soft. NTND. Striae present. BS positive and normal. No masses palpated or HSM, however d/t body habitus this would be difficult to accomplish.   Skin: Acanthosis nigricans present    Transvaginal results: Right ovary: 3.2 x 1.5 x 2.2 cm. Normal appearance. No adnexal  mass identified.  Left ovary: 3.9 x 3.7 x 2.9 cm. 3 cm simple cyst or follicle noted.  No evidence of ovarian or adnexal mass.  Other Findings: No free fluid  IMPRESSION:  Negative. No evidence of pelvic mass or other significant  abnormality.

## 2012-11-19 DIAGNOSIS — C9241 Acute promyelocytic leukemia, in remission: Secondary | ICD-10-CM | POA: Insufficient documentation

## 2012-11-19 NOTE — Assessment & Plan Note (Addendum)
-   Patient rescheduled for her no show [place].  - 3cm simple cyst noted on left ovary, otherwise no abnormalities noted on pelvic ultrasound.  - Trial, with compliance stressed, of progesterone. Patient is to report to this office with results of menstrual cycle or not.  - Urine Preg; Negative today - Endometrial lining 5mm (double layer). - Probable etiology of amenorrhea is PCOS, given clinical symptoms (amenorrhea, obesity, glucose intolerance, hirsutism)   - FSH: 4.2  LH: 6.7 on last visit, WNL and follicular phase. Ratio suggestive of PCOS - Consider: prolactin level and fasting lipids next visit.  - patient has been without diabetes medications for over a month, declines birth control to regulate cycle.  - metformin and other diabetic medications re-ordered today, patient counseling was performed in detail for her diabetes and possible complications of fertility and pregnancy. Advised patient to practice safe sex measures until diabetes and menstrual cycle are better controlled and regular.  - F/U: after progesterone trial. 3 weeks. Dr. Raymondo Band appointment for diabetes management.

## 2012-11-25 ENCOUNTER — Ambulatory Visit: Payer: Medicaid Other | Admitting: Pharmacist

## 2012-12-07 ENCOUNTER — Encounter: Payer: Self-pay | Admitting: *Deleted

## 2012-12-12 ENCOUNTER — Other Ambulatory Visit: Payer: Medicaid Other

## 2013-01-09 ENCOUNTER — Encounter: Payer: Self-pay | Admitting: *Deleted

## 2013-01-16 ENCOUNTER — Other Ambulatory Visit: Payer: Medicaid Other

## 2013-01-19 ENCOUNTER — Encounter: Payer: Self-pay | Admitting: Family Medicine

## 2013-01-19 ENCOUNTER — Ambulatory Visit (INDEPENDENT_AMBULATORY_CARE_PROVIDER_SITE_OTHER): Payer: Medicaid Other | Admitting: Family Medicine

## 2013-01-19 VITALS — BP 127/86 | HR 92 | Wt 276.0 lb

## 2013-01-19 DIAGNOSIS — C9240 Acute promyelocytic leukemia, not having achieved remission: Secondary | ICD-10-CM

## 2013-01-19 DIAGNOSIS — Z3201 Encounter for pregnancy test, result positive: Secondary | ICD-10-CM

## 2013-01-19 DIAGNOSIS — C9200 Acute myeloblastic leukemia, not having achieved remission: Secondary | ICD-10-CM

## 2013-01-19 DIAGNOSIS — R109 Unspecified abdominal pain: Secondary | ICD-10-CM

## 2013-01-19 DIAGNOSIS — O0990 Supervision of high risk pregnancy, unspecified, unspecified trimester: Secondary | ICD-10-CM | POA: Insufficient documentation

## 2013-01-19 DIAGNOSIS — K3189 Other diseases of stomach and duodenum: Secondary | ICD-10-CM

## 2013-01-19 DIAGNOSIS — Z3401 Encounter for supervision of normal first pregnancy, first trimester: Secondary | ICD-10-CM

## 2013-01-19 LAB — POCT URINALYSIS DIPSTICK
Bilirubin, UA: NEGATIVE
Ketones, UA: 40
Leukocytes, UA: NEGATIVE
Protein, UA: NEGATIVE
Spec Grav, UA: 1.02
pH, UA: 5.5

## 2013-01-19 MED ORDER — INSULIN GLARGINE 100 UNIT/ML ~~LOC~~ SOLN: 40.0000 [IU] | Freq: Every day | SUBCUTANEOUS | Status: DC

## 2013-01-19 MED ORDER — LABETALOL HCL 100 MG PO TABS: 100.0000 mg | ORAL_TABLET | Freq: Two times a day (BID) | ORAL | Status: DC

## 2013-01-19 MED ORDER — PRENATAL 27-0.8 MG PO TABS: 1.0000 | ORAL_TABLET | Freq: Every day | ORAL | Status: DC

## 2013-01-19 NOTE — Patient Instructions (Addendum)
Prenatal Care   WHAT IS PRENATAL CARE?   Prenatal care means health care during your pregnancy, before your baby is born. Take care of yourself and your baby by:   · Getting early prenatal care. If you know you are pregnant, or think you might be pregnant, call your caregiver as soon as possible. Schedule a visit for a general/prenatal examination.  · Getting regular prenatal care. Follow your caregiver's schedule for blood and other necessary tests. Do not miss appointments.  · Do everything you can to keep yourself and your baby healthy during your pregnancy.  · Prenatal care should include evaluation of medical, dietary, educational, psychological, and social needs for the couple and the medical, surgical, and genetic history of the family of the mother and father.  · Discuss with your caregiver:  · Your medicines, prescription, over-the-counter, and herbal medicines.  · Substance abuse, alcohol, smoking, and illegal drugs.  · Domestic abuse and violence, if present.  · Your immunizations.  · Nutrition and diet.  · Exercising.  · Environment and occupational hazards, at home and at work.  · History of sexually transmitted disease, both you and your partner.  · Previous pregnancies.  WHY IS PRENATAL CARE SO IMPORTANT?   By seeing you regularly, your caregiver has the chance to find problems early, so that they can be treated as soon as possible. Other problems might be prevented. Many studies have shown that early and regular prenatal care is important for the health of both mothers and their babies.   I AM THINKING ABOUT GETTING PREGNANT. HOW CAN I TAKE CARE OF MYSELF?   Taking care of yourself before you get pregnant helps you to have a healthy pregnancy. It also lowers your chances of having a baby born with a birth defect. Here are ways to take care of yourself before you get pregnant:   · Eat healthy foods, exercise regularly (30 minutes per day for most days of the week is best), and get enough rest and  sleep. Talk to your caregiver about what kinds of foods and exercises are best for you.  · Take 400 micrograms (mcg) of folic acid (one of the B vitamins) every day. The best way to do this is to take a daily multivitamin pill that contains this amount of folic acid. Getting enough of the synthetic (manufactured) form of folic acid every day before you get pregnant and during early pregnancy can help prevent certain birth defects. Many breakfast cereals and other grain products have folic acid added to them, but only certain cereals contain 400 mcg of folic acid per serving. Check the label on your multivitamin or cereal to find the amount of folic acid in the food.  · See your caregiver for a complete check up before getting pregnant. Make sure that you have had all your immunization shots, especially for rubella (German measles). Rubella can cause serious birth defects. Chickenpox is another illness you want to avoid during pregnancy. If you have had chickenpox and rubella in the past, you should be immune to them.  · Tell your caregiver about any prescription or non-prescription medicines (including herbal remedies) you are taking. Some medicines are not safe to take during pregnancy.  · Stop smoking cigarettes, drinking alcohol, or taking illegal drugs. Ask your caregiver for help, if you need it. You can also get help with alcohol and drugs by talking with a member of your faith community, a counselor, or a trusted friend.  · Discuss   and treat any medical, social, or psychological problems before getting pregnant.  · Discuss any history of genetic problems in the mother, father, and their families. Do genetic testing before getting pregnant, when possible.  · Discuss any physical or emotional abuse with your caregiver.  · Discuss with your caregiver if you might be exposed to harmful chemicals on your job or where you live.  · Discuss with your caregiver if you think your job or the hours you work may be  harmful and should be changed.  · The father should be involved with the decision making and with all aspects of the pregnancy, labor, and delivery.  · If you have medical insurance, make sure you are covered for pregnancy.  I JUST FOUND OUT THAT I AM PREGNANT. HOW CAN I TAKE CARE OF MYSELF?   Here are ways to take care of yourself and the precious new life growing inside you:   · Continue taking your multivitamin with 400 micrograms (mcg) of folic acid every day.  · Get early and regular prenatal care. It does not matter if this is your first pregnancy or if you already have children. It is very important to see a caregiver during your pregnancy. Your caregiver will check at each visit to make sure that you and the baby are healthy. If there are any problems, action can be taken right away to help you and the baby.  · Eat a healthy diet that includes:  · Fruits.  · Vegetables.  · Foods low in saturated fat.  · Grains.  · Calcium-rich foods.  · Drink 6 to 8 glasses of liquids a day.  · Unless your caregiver tells you not to, try to be physically active for 30 minutes, most days of the week. If you are pressed for time, you can get your activity in through 10 minute segments, three times a day.  · If you smoke, drink alcohol, or use drugs, STOP. These can cause long-term damage to your baby. Talk with your caregiver about steps to take to stop smoking. Talk with a member of your faith community, a counselor, a trusted friend, or your caregiver if you are concerned about your alcohol or drug use.  · Ask your caregiver before taking any medicine, even over-the-counter medicines. Some medicines are not safe to take during pregnancy.  · Get plenty of rest and sleep.  · Avoid hot tubs and saunas during pregnancy.  · Do not have X-rays taken, unless absolutely necessary and with the recommendation of your caregiver. A lead shield can be placed on your abdomen, to protect the baby when X-rays are taken in other parts of the  body.  · Do not empty the cat litter when you are pregnant. It may contain a parasite that causes an infection called toxoplasmosis, which can cause birth defects. Also, use gloves when working in garden areas used by cats.  · Do not eat uncooked or undercooked cheese, meats, or fish.  · Stay away from toxic chemicals like:  · Insecticides.  · Solvents (some cleaners or paint thinners).  · Lead.  · Mercury.  · Sexual relations may continue until the end of the pregnancy, unless you have a medical problem or there is a problem with the pregnancy and your caregiver tells you not to.  · Do not wear high heel shoes, especially during the second half of the pregnancy. You can lose your balance and fall.  · Do not take long trips, unless   absolutely necessary. Be sure to see your caregiver before going on the trip.  · Do not sit in one position for more than 2 hours, when on a trip.  · Take a copy of your medical records when going on a trip.  · Know where there is a hospital in the city you are visiting, in case of an emergency.  · Most dangerous household products will have pregnancy warnings on their labels. Ask your caregiver about products if you are unsure.  · Limit or eliminate your caffeine intake from coffee, tea, sodas, medicines, and chocolate.  · Many women continue working through pregnancy. Staying active might help you stay healthier. If you have a question about the safety or the hours you work at your particular job, talk with your caregiver.  · Get informed:  · Read books.  · Watch videos.  · Go to childbirth classes for you and the father.  · Talk with experienced moms.  · Ask your caregiver about childbirth education classes for you and your partner. Classes can help you and your partner prepare for the birth of your baby.  · Ask about a pediatrician (baby doctor) and methods and pain medicine for labor, delivery, and possible Cesarean delivery (C-section).  I AM NOT THINKING ABOUT GETTING PREGNANT  RIGHT NOW, BUT HEARD THAT ALL WOMEN SHOULD TAKE FOLIC ACID EVERY DAY?   All women of childbearing age, with even a remote chance of getting pregnant, should try to make sure they get enough folic acid. Many pregnancies are not planned. Many women do not know they are actually pregnant early in their pregnancies, and certain birth defects happen in the very early part of pregnancy. Taking 400 micrograms (mcg) of folic acid every day will help prevent certain birth defects that happen in the early part of pregnancy. If a woman begins taking vitamin pills in the second or third month of pregnancy, it may be too late to prevent birth defects. Folic acid may also have other health benefits for women, besides preventing birth defects.   HOW OFTEN SHOULD I SEE MY CAREGIVER DURING PREGNANCY?   Your caregiver will give you a schedule for your prenatal visits. You will have visits more often as you get closer to the end of your pregnancy. An average pregnancy lasts about 40 weeks.   A typical schedule includes visiting your caregiver:   · About once each month, during your first 6 months of pregnancy.  · Every 2 weeks, during the next 2 months.  · Weekly in the last month, until the delivery date.  Your caregiver will probably want to see you more often if:  · You are over 35.  · Your pregnancy is high risk, because you have certain health problems or problems with the pregnancy, such as:  · Diabetes.  · High blood pressure.  · The baby is not growing on schedule, according to the dates of the pregnancy.  Your caregiver will do special tests, to make sure you and the baby are not having any serious problems.  WHAT HAPPENS DURING PRENATAL VISITS?   · At your first prenatal visit, your caregiver will talk to you about you and your partner's health history and your family's health history, and will do a physical exam.  · On your first visit, a physical exam will include checks of your blood pressure, height and weight, and an  exam of your pelvic organs. Your caregiver will do a Pap test if you have   not had one recently, and will do cultures of your cervix to make sure there is no infection.  · At each visit, there will be tests of your blood, urine, blood pressure, weight, and checking the progress of the baby.  · Your caregiver will be able to tell you when to expect that your baby will be born.  · Each visit is also a chance for you to learn about staying healthy during pregnancy and for asking questions.  · Discuss whether you will be breastfeeding.  · At your later prenatal visits, your caregiver will check how you are doing and how the baby is developing. You may have a number of tests done as your pregnancy progresses.  · Ultrasound exams are often used to check on the baby's growth and health.  · You may have more urine and blood tests, as well as special tests, if needed. These may include amniocentesis (examine fluid in the pregnancy sac), stress tests (check how baby responds to contractions), biophysical profile (measures fetus well-being). Your caregiver will explain the tests and why they are necessary.  I AM IN MY LATE THIRTIES, AND I WANT TO HAVE A CHILD NOW. SHOULD I DO ANYTHING SPECIAL?   As you get older, there is more chance of having a medical problem (high blood pressure), pregnancy problem (preeclampsia, problems with the placenta), miscarriage, or a baby born with a birth defect. However, most women in their late thirties and early forties have healthy babies. See your caregiver on a regular basis before you get pregnant and be sure to go for exams throughout your pregnancy. Your caregiver probably will want to do some special tests to check on you and your baby's health when you are pregnant.   Women today are often delaying having children until later in life, when they are in their thirties and forties. While many women in their thirties and forties have no difficulty getting pregnant, fertility does decline  with age. For women over 40 who cannot get pregnant after 6 months of trying, it is recommended that they see their caregiver for a fertility evaluation. It is not uncommon to have trouble becoming pregnant or experience infertility (inability to become pregnant after trying for one year). If you think that you or your partner may be infertile, you can discuss this with your caregiver. He or she can recommend treatments such as drugs, surgery, or assisted reproductive technology.   Document Released: 09/10/2003 Document Revised: 11/30/2011 Document Reviewed: 08/07/2009  [employer]® Patient Information ©2013 [employer].

## 2013-01-19 NOTE — Progress Notes (Signed)
Patient ID: Melissa Washington, female   DOB: Oct 30, 1985, 27 y.o.   MRN: [id number]  [facility] Ethelyne Erich M. Fabio Wah, MD Phone: [phone]   Subjective: HPI: Patient is a 27 y.o. female presenting to clinic today for same day appointment. Concerns today include positive home preg test, abd pain, N/V.   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 Started on Provera first of feb, didn't have a period. RTC Feb 28, neg preg test then. Started provera again March 1st and had 3 day period. (First day on March 4) Took home pregnancy test yesterday and was positive, also positive in our clinic today. She states her abd pain is up high. She has intermittent nausea and vomited once yesterday. No vaginal bleeding or discharge. No lower abdominal pain. No dysuria. She was wanting to be pregnancy. Currently on Lisinopril and Metformin. (Also prescribed Lantus which she is not taking.) Does not monitor CBG at home. Currently in process of moving and not sure where her Lantus is.  History Reviewed: Passive smoker. Health Maintenance: UTD  ROS: Please see HPI above.  Objective: Office vital signs reviewed. BP 127/86  Pulse 92  Wt 276 lb (125.193 kg)  BMI 50.47 kg/m2  LMP 12/26/2012  Physical Examination:  General: Awake, alert. NAD. HEENT: Atraumatic, normocephalic. MMM Pulm: CTAB, no wheezes Cardio: RRR, no murmurs appreciated Abdomen:Obese, +BS, soft, mild TTP epigastrum Neuro: Grossly intact  Assessment: 27 y.o. female with pregnancy  Plan: See Problem List and After Visit Summary

## 2013-01-19 NOTE — Assessment & Plan Note (Signed)
Patient with positive pregnancy test. Unsure of exact dating. If we go based on her period after Provera, she is [redacted]w[redacted]d today, but I do not know how reliable this is. [facility]. Also, patient will stop Lisinopril and Metformin today since they cause adverse effects in pregnancy. She will take Lantus only for now, and check her CBG. Will likely need addition of mealtime coverage but given his non-compliance with insulin at this point, I did not add that today. Changed lisinopril to Labetatol 100mg  BID. Patient's OB labs ordered today. Will RTC for initial OB with her PCP. If this cannot be scheduled in next 2 weeks, she should return for normal follow up appointment to follow up her blood pressure and blood sugar.   Patient will need to go to high risk OB for her pregnancy supervision after establishing prenatal care.

## 2013-01-20 LAB — OBSTETRIC PANEL
Antibody Screen: NEGATIVE
Eosinophils Relative: 1 % (ref 0–5)
HCT: 36.6 % (ref 36.0–46.0)
Hemoglobin: 11.9 g/dL — ABNORMAL LOW (ref 12.0–15.0)
Lymphocytes Relative: 24 % (ref 12–46)
MCHC: 32.5 g/dL (ref 30.0–36.0)
MCV: 85.5 fL (ref 78.0–100.0)
Monocytes Absolute: 0.4 10*3/uL (ref 0.1–1.0)
Monocytes Relative: 5 % (ref 3–12)
Neutro Abs: 5.7 10*3/uL (ref 1.7–7.7)
RDW: 13.9 % (ref 11.5–15.5)
Rh Type: POSITIVE
Rubella: 2.06 Index — ABNORMAL HIGH (ref <0.90)
WBC: 8.2 10*3/uL (ref 4.0–10.5)

## 2013-01-20 LAB — HIV ANTIBODY (ROUTINE TESTING W REFLEX): HIV: NONREACTIVE

## 2013-01-23 ENCOUNTER — Telehealth: Payer: Self-pay | Admitting: Family Medicine

## 2013-01-23 MED ORDER — CEPHALEXIN 500 MG PO CAPS: 500.0000 mg | ORAL_CAPSULE | Freq: Two times a day (BID) | ORAL | Status: DC

## 2013-01-23 NOTE — Telephone Encounter (Signed)
Patient urine resulted with mild UTI. Will treat for 7 days of keflex, two times a day. Please call patient and make her aware. Prescription should be ready. This is safe during pregnancy, if she asks.

## 2013-01-24 ENCOUNTER — Inpatient Hospital Stay (HOSPITAL_COMMUNITY)
Admission: AD | Admit: 2013-01-24 | Discharge: 2013-01-24 | Disposition: A | Discharge status: Home / Self Care | Payer: Medicaid Other | Source: Ambulatory Visit | Attending: Obstetrics & Gynecology | Admitting: Obstetrics & Gynecology

## 2013-01-24 ENCOUNTER — Inpatient Hospital Stay (HOSPITAL_COMMUNITY): Payer: Medicaid Other

## 2013-01-24 ENCOUNTER — Encounter (HOSPITAL_COMMUNITY): Payer: Self-pay | Admitting: *Deleted

## 2013-01-24 DIAGNOSIS — O2390 Unspecified genitourinary tract infection in pregnancy, unspecified trimester: Secondary | ICD-10-CM | POA: Insufficient documentation

## 2013-01-24 DIAGNOSIS — N760 Acute vaginitis: Secondary | ICD-10-CM

## 2013-01-24 DIAGNOSIS — B9689 Other specified bacterial agents as the cause of diseases classified elsewhere: Secondary | ICD-10-CM | POA: Insufficient documentation

## 2013-01-24 DIAGNOSIS — A499 Bacterial infection, unspecified: Secondary | ICD-10-CM

## 2013-01-24 DIAGNOSIS — R109 Unspecified abdominal pain: Secondary | ICD-10-CM | POA: Insufficient documentation

## 2013-01-24 DIAGNOSIS — Z3490 Encounter for supervision of normal pregnancy, unspecified, unspecified trimester: Secondary | ICD-10-CM

## 2013-01-24 DIAGNOSIS — O209 Hemorrhage in early pregnancy, unspecified: Secondary | ICD-10-CM | POA: Insufficient documentation

## 2013-01-24 LAB — URINALYSIS, ROUTINE W REFLEX MICROSCOPIC
Glucose, UA: NEGATIVE mg/dL
Ketones, ur: NEGATIVE mg/dL
Leukocytes, UA: NEGATIVE
Nitrite: NEGATIVE
Protein, ur: NEGATIVE mg/dL
pH: 5.5 (ref 5.0–8.0)

## 2013-01-24 LAB — WET PREP, GENITAL: Trich, Wet Prep: NONE SEEN

## 2013-01-24 LAB — CBC
MCH: 28.8 pg (ref 26.0–34.0)
MCHC: 34.4 g/dL (ref 30.0–36.0)
MCV: 83.9 fL (ref 78.0–100.0)
Platelets: 235 10*3/uL (ref 150–400)

## 2013-01-24 LAB — HCG, QUANTITATIVE, PREGNANCY: hCG, Beta Chain, Quant, S: 23564 m[IU]/mL — ABNORMAL HIGH (ref <5)

## 2013-01-24 LAB — URINE MICROSCOPIC-ADD ON

## 2013-01-24 MED ORDER — METRONIDAZOLE 500 MG PO TABS: 500.0000 mg | ORAL_TABLET | Freq: Two times a day (BID) | ORAL | Status: DC

## 2013-01-24 MED ORDER — ACETAMINOPHEN 500 MG PO TABS
1000.0000 mg | ORAL_TABLET | Freq: Once | ORAL | Status: AC
  Administered 2013-01-24: 1000 mg via ORAL
  Filled 2013-01-24: qty 2

## 2013-01-24 NOTE — MAU Provider Note (Signed)
History     CSN: [id number]  Arrival date and time: 01/24/13 1150   First Provider Initiated Contact with Patient 01/24/13 1233      Chief Complaint  Patient presents with  . Abdominal Pain  . Vaginal Bleeding   HPI [name] is a 27 y.o. G2P0101 at [redacted]w[redacted]d who presents to [facility]U today with complaint of abdominal pain and vaginal bleeding. The patient states that she is unsure of dating as she has a history of irregular periods and has been on medications to help her have a cycle and conceive. She is a patient of [facility]. She presents today with left mid abdominal pain and spotting this morning when she wiped. She denies heavy bleeding, N/V, fever, dizziness, UTI symptoms or GI symptoms. She has an [place] scheduled for Thursday.   OB History   Grav Para Term Preterm Abortions TAB SAB Ect Mult Living   2 1 0 1 0 0 0 0 0 1       Past Medical History  Diagnosis Date  . Diabetes mellitus   . Hypertension   . Nephrolithiasis   . 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 Hepatic steatosis 01/17/2012  . Cholelithiasis 01/17/2012    Past Surgical History  Procedure Laterality Date  . Portacath placement      [facility]  . Cholecystectomy  01/31/2012    Procedure: LAPAROSCOPIC CHOLECYSTECTOMY WITH INTRAOPERATIVE CHOLANGIOGRAM;  Surgeon: Clovis Pu. Cornett, MD;  Location: [facility] OR;  Service: General;  Laterality: N/A;    Family History  Problem Relation Age of Onset  . Kidney disease Mother   . Hypertension Mother   . Epilepsy Mother   . Sleep apnea Mother   . Kidney disease Maternal Grandmother   . Diabetes Maternal Grandmother   . Diabetes Father     History  Substance Use Topics  . Smoking status: Never Smoker   . Smokeless tobacco: Not on file  . Alcohol Use: No    Allergies:  Allergies  Allergen Reactions  . Ultram (Tramadol Hcl) Nausea And Vomiting    No prescriptions prior to admission    Review of Systems  Constitutional: Negative for fever, chills  and malaise/fatigue.  Gastrointestinal: Positive for abdominal pain. Negative for nausea, vomiting, diarrhea and constipation.  Genitourinary: Negative for dysuria, urgency and frequency.       + vaginal bleeding Neg - vaginal discharge  Neurological: Negative for weakness.   Physical Exam   Blood pressure 119/68, pulse 74, temperature 98 F (36.7 C), temperature source Oral, resp. rate 18, height 5\' 1"  (1.549 m), weight 283 lb (128.368 kg), last menstrual period 12/26/2012.  Physical Exam  Constitutional: She is oriented to person, place, and time. She appears well-developed and well-nourished. No distress.  HENT:  Head: Normocephalic and atraumatic.  Cardiovascular: Normal rate, regular rhythm and normal heart sounds.   Respiratory: Effort normal and breath sounds normal. No respiratory distress.  GI: Soft. Bowel sounds are normal. She exhibits no distension. There is tenderness (mild tenderness to palpation in the lower abdomen and left mid abdomen).  Genitourinary: Uterus is not enlarged and not tender. Cervix exhibits no motion tenderness, no discharge and no friability. Right adnexum displays no mass. Left adnexum displays tenderness. Left adnexum displays no mass. There is bleeding (minimal pink spotting noted) around the vagina. Vaginal discharge (small amount of white mucus discharge noted) found.  Neurological: She is alert and oriented to person, place, and time.  Skin: Skin is  warm and dry. No erythema.  Psychiatric: She has a normal mood and affect.   Results for orders placed during the hospital encounter of 01/24/13 (from the past 24 hour(s))  URINALYSIS, ROUTINE W REFLEX MICROSCOPIC     Status: Abnormal   Collection Time    01/24/13 12:09 PM      Result Value Range   Color, Urine YELLOW  YELLOW   APPearance CLEAR  CLEAR   Specific Gravity, Urine 1.020  1.005 - 1.030   pH 5.5  5.0 - 8.0   Glucose, UA NEGATIVE  NEGATIVE mg/dL   Hgb urine dipstick LARGE (*) NEGATIVE    Bilirubin Urine NEGATIVE  NEGATIVE   Ketones, ur NEGATIVE  NEGATIVE mg/dL   Protein, ur NEGATIVE  NEGATIVE mg/dL   Urobilinogen, UA 0.2  0.0 - 1.0 mg/dL   Nitrite NEGATIVE  NEGATIVE   Leukocytes, UA NEGATIVE  NEGATIVE  URINE MICROSCOPIC-ADD ON     Status: None   Collection Time    01/24/13 12:09 PM      Result Value Range   Squamous Epithelial / LPF RARE  RARE   WBC, UA 0-2  <3 WBC/hpf   RBC / HPF 3-6  <3 RBC/hpf   Bacteria, UA RARE  RARE   Urine-Other MUCOUS PRESENT    WET PREP, GENITAL     Status: Abnormal   Collection Time    01/24/13 12:45 PM      Result Value Range   Yeast Wet Prep HPF POC NONE SEEN  NONE SEEN   Trich, Wet Prep NONE SEEN  NONE SEEN   Clue Cells Wet Prep HPF POC FEW (*) NONE SEEN   WBC, Wet Prep HPF POC FEW (*) NONE SEEN  CBC     Status: Abnormal   Collection Time    01/24/13 12:50 PM      Result Value Range   WBC 7.8  4.0 - 10.5 K/uL   RBC 3.85 (*) 3.87 - 5.11 MIL/uL   Hemoglobin 11.1 (*) 12.0 - 15.0 g/dL   HCT 45.4 (*) 09.8 - 11.9 %   MCV 83.9  78.0 - 100.0 fL   MCH 28.8  26.0 - 34.0 pg   MCHC 34.4  30.0 - 36.0 g/dL   RDW 14.7  82.9 - 56.2 %   Platelets 235  150 - 400 K/uL  HCG, QUANTITATIVE, PREGNANCY     Status: Abnormal   Collection Time    01/24/13 12:50 PM      Result Value Range   hCG, Beta Chain, Quant, S 23564 (*) <5 mIU/mL    MAU Course  Procedures None  MDM Wet prep, GC/Chlamydia, CBC, quant hCG and [place] today B+ per previous visit in Epic  Assessment and Plan  A: IUP at 7w 5d Small subchorionic hemorrhage  Vaginal bleeding in early pregnancy Bacterial vaginosis  P: Discharge home Rx for Flagyl sent to patient's pharmacy Patient to follow-up with [facility] as scheduled Patient will be referred to [facility] for prenatal care Patient may return to MAU as needed or if her condition were to change or worsen  Freddi Starr, PA-C  01/24/2013, 3:28 PM

## 2013-01-24 NOTE — Telephone Encounter (Signed)
[facility] as well for a bacterial infection,she would like to know witch med she should be taking or should she take both?

## 2013-01-24 NOTE — MAU Note (Signed)
Been having abd pain LLQ on and off for week. Awoke this am and had bright blood on tissue when wiped but has not happened again.

## 2013-01-24 NOTE — MAU Provider Note (Signed)
Attestation of Attending Supervision of Advanced Practitioner (PA/CNM/NP): Evaluation and management procedures were performed by the Advanced Practitioner under my supervision and collaboration.  I have reviewed the Advanced Practitioner's note and chart, and I agree with the management and plan.  Elber Galyean, MD, FACOG Attending Obstetrician & Gynecologist Faculty Practice, [facility]  

## 2013-01-24 NOTE — Progress Notes (Signed)
Written and verbal d/c instructions given and understanding voiced. Joseph Berkshire PA in before d/c to discuss u/s results and plan of care.

## 2013-01-25 LAB — GC/CHLAMYDIA PROBE AMP: CT Probe RNA: NEGATIVE

## 2013-01-26 ENCOUNTER — Ambulatory Visit (HOSPITAL_COMMUNITY): Payer: Medicaid Other

## 2013-01-26 ENCOUNTER — Other Ambulatory Visit: Payer: Self-pay | Admitting: Family Medicine

## 2013-01-26 ENCOUNTER — Ambulatory Visit (INDEPENDENT_AMBULATORY_CARE_PROVIDER_SITE_OTHER): Payer: Medicaid Other | Admitting: Family Medicine

## 2013-01-26 VITALS — BP 120/82 | Temp 98.6°F | Wt 279.0 lb

## 2013-01-26 DIAGNOSIS — E1165 Type 2 diabetes mellitus with hyperglycemia: Secondary | ICD-10-CM

## 2013-01-26 DIAGNOSIS — IMO0001 Reserved for inherently not codable concepts without codable children: Secondary | ICD-10-CM

## 2013-01-26 MED ORDER — CEPHALEXIN 500 MG PO CAPS: 500.0000 mg | ORAL_CAPSULE | Freq: Two times a day (BID) | ORAL | Status: DC

## 2013-01-26 NOTE — Patient Instructions (Signed)
It was nice to see you today.  Keep up the good work with your blood sugar. I would say just try the Benadryl and Tylenol for your allergies and headaches.  Please keep your appointment with Dr. Claiborne Billings on the 20th. If anything changes before then, please let me know!  Nea Gittens M. Aurore Redinger, M.D.

## 2013-01-26 NOTE — Telephone Encounter (Signed)
Left message on voicemail. Moneka Mcquinn S  

## 2013-01-26 NOTE — Telephone Encounter (Signed)
Sh will need to take the medication she received from [facility]U for BV AND the keflex for UTI

## 2013-01-27 NOTE — Progress Notes (Signed)
Patient returns to [facility] today for follow up appointment, not for initial OB appointment. She was diagnosed with pregnancy last week. Was seen at [facility]U a few days ago for vaginal bleeding. She did have u/s at [facility] which shows intrauterine pregnancy at [redacted]w[redacted]d today. No further bleeding or problems.  1. DM- On insulin qhs. She has been checking CBG. In our clinic today she is 59. Her A1C was 8.6, but she has only been doing insulin for one week. No reported symptoms or side effects. Highest she has seen lately is 190. Partner is with her today and he is concerned about her blood sugar and asking appropriate questions. Will need high risk referral after initial OB appointment.  2. She states she was told by her PCP to take antibiotic for urine infection, then was given a different antibiotic at [facility]U for BV. I explained to her the difference in these two medications and the importance of each. Patient agrees and states she will complete both  3. Seasonal allergies- Pt also complaining of headaches and allergies. She has not tried anything for her headaches since she does not know what to take.  4. HTN - On Labetalol daily with no side effects. BP controlled today.  O:  Filed Vitals:   01/26/13 1433  BP: 120/82  Temp: 98.6 F (37 C)  Gen: Awake, alert. Happy HEENT: MMM Cardio: RRR Pulm: CTAB Abd: Obese, soft  A/P: G2P0101 at [redacted]w[redacted]d presenting for clinic follow up - A1C and random CBG today - Had GC/Ch at [facility] which were negative - Will RTC on May 20 for initial OB appointment, and will then be transferred to high risk. I have explained this to patient and she understands - Continue insulin, and check CBG - Continue Labetalol for HTN - Benadryl as needed for allergies. Can also use saline nasal spray. Discussed Flonase but since this is a steroid and she is in first trimester, we did not start this today. - Tylenol as needed for headaches - Also, briefly discussed weight gain goals in pregnancy, but  this should be readdressed.  Amber M. Hairford, M.D.

## 2013-01-30 NOTE — Progress Notes (Signed)
Patient chart reviewed, discussed with Dr Mikel Cella.  I agree with prompt referral to High-Risk OB given patient's history of HTN and DM with poor control (as evidenced by blood glucose measurements in this chart). Paula Compton, MD

## 2013-02-06 ENCOUNTER — Ambulatory Visit (HOSPITAL_BASED_OUTPATIENT_CLINIC_OR_DEPARTMENT_OTHER): Payer: Medicaid Other | Admitting: Oncology

## 2013-02-06 ENCOUNTER — Other Ambulatory Visit (HOSPITAL_BASED_OUTPATIENT_CLINIC_OR_DEPARTMENT_OTHER): Payer: Medicaid Other | Admitting: Lab

## 2013-02-06 ENCOUNTER — Telehealth: Payer: Self-pay | Admitting: Oncology

## 2013-02-06 VITALS — BP 131/73 | HR 78 | Temp 98.2°F | Resp 18 | Ht 61.0 in | Wt 282.0 lb

## 2013-02-06 DIAGNOSIS — C9191 Lymphoid leukemia, unspecified, in remission: Secondary | ICD-10-CM

## 2013-02-06 DIAGNOSIS — I10 Essential (primary) hypertension: Secondary | ICD-10-CM

## 2013-02-06 DIAGNOSIS — C9241 Acute promyelocytic leukemia, in remission: Secondary | ICD-10-CM

## 2013-02-06 DIAGNOSIS — E119 Type 2 diabetes mellitus without complications: Secondary | ICD-10-CM

## 2013-02-06 DIAGNOSIS — C9240 Acute promyelocytic leukemia, not having achieved remission: Secondary | ICD-10-CM

## 2013-02-06 LAB — COMPREHENSIVE METABOLIC PANEL (CC13)
ALT: 33 U/L (ref 0–55)
Albumin: 3.6 g/dL (ref 3.5–5.0)
CO2: 23 mEq/L (ref 22–29)
Calcium: 9.1 mg/dL (ref 8.4–10.4)
Chloride: 104 mEq/L (ref 98–107)
Glucose: 120 mg/dl — ABNORMAL HIGH (ref 70–99)
Potassium: 4 mEq/L (ref 3.5–5.1)
Sodium: 137 mEq/L (ref 136–145)
Total Bilirubin: 0.52 mg/dL (ref 0.20–1.20)
Total Protein: 7 g/dL (ref 6.4–8.3)

## 2013-02-06 LAB — CBC WITH DIFFERENTIAL/PLATELET
Eosinophils Absolute: 0.1 10*3/uL (ref 0.0–0.5)
HCT: 35.7 % (ref 34.8–46.6)
LYMPH%: 24.1 % (ref 14.0–49.7)
MCV: 85.5 fL (ref 79.5–101.0)
MONO%: 6.7 % (ref 0.0–14.0)
NEUT#: 6.1 10*3/uL (ref 1.5–6.5)
NEUT%: 67.3 % (ref 38.4–76.8)
Platelets: 256 10*3/uL (ref 145–400)
RBC: 4.17 10*6/uL (ref 3.70–5.45)

## 2013-02-06 LAB — MORPHOLOGY

## 2013-02-06 LAB — LACTATE DEHYDROGENASE (CC13): LDH: 157 U/L (ref 125–245)

## 2013-02-06 LAB — CHCC SMEAR

## 2013-02-06 NOTE — Telephone Encounter (Signed)
gv and printed appt sched and avs for pt  °

## 2013-02-07 ENCOUNTER — Other Ambulatory Visit (HOSPITAL_COMMUNITY)
Admission: RE | Admit: 2013-02-07 | Discharge: 2013-02-07 | Disposition: A | Discharge status: Home / Self Care | Payer: Medicaid Other | Source: Ambulatory Visit | Attending: Family Medicine | Admitting: Family Medicine

## 2013-02-07 ENCOUNTER — Ambulatory Visit (INDEPENDENT_AMBULATORY_CARE_PROVIDER_SITE_OTHER): Payer: Medicaid Other | Admitting: Family Medicine

## 2013-02-07 VITALS — BP 131/62 | Wt 281.0 lb

## 2013-02-07 DIAGNOSIS — Z3481 Encounter for supervision of other normal pregnancy, first trimester: Secondary | ICD-10-CM

## 2013-02-07 DIAGNOSIS — Z331 Pregnant state, incidental: Secondary | ICD-10-CM

## 2013-02-07 DIAGNOSIS — Z3480 Encounter for supervision of other normal pregnancy, unspecified trimester: Secondary | ICD-10-CM

## 2013-02-07 DIAGNOSIS — Z113 Encounter for screening for infections with a predominantly sexual mode of transmission: Secondary | ICD-10-CM | POA: Insufficient documentation

## 2013-02-07 NOTE — Progress Notes (Signed)
Melissa Washington is 27 y.o.  [redacted]w[redacted]d G2P0101 presents today for her initial OB visit. She is a complicated patient with many medical issues for her age including PML, and non-compliance with diabetes and HTN. She complains of mild lower abdominal cramping. Denies leaking, gush of fluid, or vaginal bleeding. Endorses breast tenderness and increase in headaches, since she has found out she was pregnant. She is taking prenatal vitamin daily.   O: BP 131/62  Wt 281 lb (127.461 kg)  BMI 53.12 kg/m2  LMP 12/26/2012  BP 131/62  Wt 281 lb (127.461 kg)  BMI 53.12 kg/m2  LMP 12/26/2012 Gen: Morbidly obese female. Pleasant and happy about the pregnancy. Partner with her today.   A/P: - Counseled on healthy weight gain during pregnancy and the complications being overweight can have on a pregnancy. - Gonorrhea and chlamydia obtained today. - Last PAP was June 2013; WNL - High risk clinic referral immediately; will need to be seen prior to her need for integrated screen. If she can not be seen there before she is 13 weeks, she needs to be seen here and have her screening completed. She is in understanding and is aware.  - Counseled on close control of CBG; currently on Lantus

## 2013-02-07 NOTE — Patient Instructions (Addendum)
Pregnancy - First Trimester During sexual intercourse, millions of sperm go into the vagina. Only 1 sperm will penetrate and fertilize the female egg while it is in the Fallopian tube. One week later, the fertilized egg implants into the wall of the uterus. An embryo begins to develop into a baby. At 6 to 8 weeks, the eyes and face are formed and the heartbeat can be seen on ultrasound. At the end of 12 weeks (first trimester), all the baby's organs are formed. Now that you are pregnant, you will want to do everything you can to have a healthy baby. Two of the most important things are to get good prenatal care and follow your caregiver's instructions. Prenatal care is all the medical care you receive before the baby's birth. It is given to prevent, find, and treat problems during the pregnancy and childbirth. PRENATAL EXAMS  During prenatal visits, your weight, blood pressure and urine are checked. This is done to make sure you are healthy and progressing normally during the pregnancy.  A pregnant woman should gain 25 to 35 pounds during the pregnancy. However, if you are over weight or underweight, your caregiver will advise you regarding your weight.  Your caregiver will ask and answer questions for you.  Blood work, cervical cultures, other necessary tests and a Pap test are done during your prenatal exams. These tests are done to check on your health and the probable health of your baby. Tests are strongly recommended and done for HIV with your permission. This is the virus that causes AIDS. These tests are done because medications can be given to help prevent your baby from being born with this infection should you have been infected without knowing it. Blood work is also used to find out your blood type, previous infections and follow your blood levels (hemoglobin).  Low hemoglobin (anemia) is common during pregnancy. Iron and vitamins are given to help prevent this. Later in the pregnancy, blood  tests for diabetes will be done along with any other tests if any problems develop. You may need tests to make sure you and the baby are doing well.  You may need other tests to make sure you and the baby are doing well. CHANGES DURING THE FIRST TRIMESTER (THE FIRST 3 MONTHS OF PREGNANCY) Your body goes through many changes during pregnancy. They vary from person to person. Talk to your caregiver about changes you notice and are concerned about. Changes can include:  Your menstrual period stops.  The egg and sperm carry the genes that determine what you look like. Genes from you and your partner are forming a baby. The female genes determine whether the baby is a boy or a girl.  Your body increases in girth and you may feel bloated.  Feeling sick to your stomach (nauseous) and throwing up (vomiting). If the vomiting is uncontrollable, call your caregiver.  Your breasts will begin to enlarge and become tender.  Your nipples may stick out more and become darker.  The need to urinate more. Painful urination may mean you have a bladder infection.  Tiring easily.  Loss of appetite.  Cravings for certain kinds of food.  At first, you may gain or lose a couple of pounds.  You may have changes in your emotions from day to day (excited to be pregnant or concerned something may go wrong with the pregnancy and baby).  You may have more vivid and strange dreams. HOME CARE INSTRUCTIONS   It is very important   to avoid all smoking, alcohol and un-prescribed drugs during your pregnancy. These affect the formation and growth of the baby. Avoid chemicals while pregnant to ensure the delivery of a healthy infant.  Start your prenatal visits by the 12th week of pregnancy. They are usually scheduled monthly at first, then more often in the last 2 months before delivery. Keep your caregiver's appointments. Follow your caregiver's instructions regarding medication use, blood and lab tests, exercise, and  diet.  During pregnancy, you are providing food for you and your baby. Eat regular, well-balanced meals. Choose foods such as meat, fish, milk and other low fat dairy products, vegetables, fruits, and whole-grain breads and cereals. Your caregiver will tell you of the ideal weight gain.  You can help morning sickness by keeping soda crackers at the bedside. Eat a couple before arising in the morning. You may want to use the crackers without salt on them.  Eating 4 to 5 small meals rather than 3 large meals a day also may help the nausea and vomiting.  Drinking liquids between meals instead of during meals also seems to help nausea and vomiting.  A physical sexual relationship may be continued throughout pregnancy if there are no other problems. Problems may be early (premature) leaking of amniotic fluid from the membranes, vaginal bleeding, or belly (abdominal) pain.  Exercise regularly if there are no restrictions. Check with your caregiver or physical therapist if you are unsure of the safety of some of your exercises. Greater weight gain will occur in the last 2 trimesters of pregnancy. Exercising will help:  Control your weight.  Keep you in shape.  Prepare you for labor and delivery.  Help you lose your pregnancy weight after you deliver your baby.  Wear a good support or jogging bra for breast tenderness during pregnancy. This may help if worn during sleep too.  Ask when prenatal classes are available. Begin classes when they are offered.  Do not use hot tubs, steam rooms or saunas.  Wear your seat belt when driving. This protects you and your baby if you are in an accident.  Avoid raw meat, uncooked cheese, cat litter boxes and soil used by cats throughout the pregnancy. These carry germs that can cause birth defects in the baby.  The first trimester is a good time to visit your dentist for your dental health. Getting your teeth cleaned is OK. Use a softer toothbrush and brush  gently during pregnancy.  Ask for help if you have financial, counseling or nutritional needs during pregnancy. Your caregiver will be able to offer counseling for these needs as well as refer you for other special needs.  Do not take any medications or herbs unless told by your caregiver.  Inform your caregiver if there is any mental or physical domestic violence.  Make a list of emergency phone numbers of family, friends, hospital, and police and fire departments.  Write down your questions. Take them to your prenatal visit.  Do not douche.  Do not cross your legs.  If you have to stand for long periods of time, rotate you feet or take small steps in a circle.  You may have more vaginal secretions that may require a sanitary pad. Do not use tampons or scented sanitary pads. MEDICATIONS AND DRUG USE IN PREGNANCY  Take prenatal vitamins as directed. The vitamin should contain 1 milligram of folic acid. Keep all vitamins out of reach of children. Only a couple vitamins or tablets containing iron may be   fatal to a baby or young child when ingested.  Avoid use of all medications, including herbs, over-the-counter medications, not prescribed or suggested by your caregiver. Only take over-the-counter or prescription medicines for pain, discomfort, or fever as directed by your caregiver. Do not use aspirin, ibuprofen, or naproxen unless directed by your caregiver.  Let your caregiver also know about herbs you may be using.  Alcohol is related to a number of birth defects. This includes fetal alcohol syndrome. All alcohol, in any form, should be avoided completely. Smoking will cause low birth rate and premature babies.  Street or illegal drugs are very harmful to the baby. They are absolutely forbidden. A baby born to an addicted mother will be addicted at birth. The baby will go through the same withdrawal an adult does.  Let your caregiver know about any medications that you have to take  and for what reason you take them. MISCARRIAGE IS COMMON DURING PREGNANCY A miscarriage does not mean you did something wrong. It is not a reason to worry about getting pregnant again. Your caregiver will help you with questions you may have. If you have a miscarriage, you may need minor surgery. SEEK MEDICAL CARE IF:  You have any concerns or worries during your pregnancy. It is better to call with your questions if you feel they cannot wait, rather than worry about them. SEEK IMMEDIATE MEDICAL CARE IF:   An unexplained oral temperature above 102 F (38.9 C) develops, or as your caregiver suggests.  You have leaking of fluid from the vagina (birth canal). If leaking membranes are suspected, take your temperature and inform your caregiver of this when you call.  There is vaginal spotting or bleeding. Notify your caregiver of the amount and how many pads are used.  You develop a bad smelling vaginal discharge with a change in the color.  You continue to feel sick to your stomach (nauseated) and have no relief from remedies suggested. You vomit blood or coffee ground-like materials.  You lose more than 2 pounds of weight in 1 week.  You gain more than 2 pounds of weight in 1 week and you notice swelling of your face, hands, feet, or legs.  You gain 5 pounds or more in 1 week (even if you do not have swelling of your hands, face, legs, or feet).  You get exposed to German measles and have never had them.  You are exposed to fifth disease or chickenpox.  You develop belly (abdominal) pain. Round ligament discomfort is a common non-cancerous (benign) cause of abdominal pain in pregnancy. Your caregiver still must evaluate this.  You develop headache, fever, diarrhea, pain with urination, or shortness of breath.  You fall or are in a car accident or have any kind of trauma.  There is mental or physical violence in your home. Document Released: 09/01/2001 Document Revised: 11/30/2011  Document Reviewed: 03/05/2009 [employer] Patient Information 2013 [employer].  

## 2013-02-08 NOTE — Progress Notes (Signed)
Hematology and Oncology Follow Up Visit  Melissa Washington [id number] [birth year] 27 y.o. 02/08/2013 9:12 PM   Principle Diagnosis: Encounter Diagnosis  Name Primary?  . 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 Please see summary note dictated by nurse practitioner Lonna Cobb on 10/17/2012. She just had followup molecular studies at [facility] last month and these were reported to her as normal. In reviewing her labs at time of today's visit I noticed a slight elevation of serum alkaline phosphatase. First asked if she had any bone pain or any recent falls. Then I asked her if she was pregnant and she told me that yes, in fact, she is 2 months pregnant.  Medications: reviewed  Allergies:  Allergies  Allergen Reactions  . Ultram (Tramadol Hcl) Nausea And Vomiting    Review of Systems: Constitutional:   No constitutional symptoms Respiratory: No cough or dyspnea Cardiovascular:  No chest pain or palpitations Gastrointestinal: No change in bowel but Genito-Urinary: No urinary tract symptoms Musculoskeletal: No muscle bone or joint pain Neurologic: No headache or change in vision Skin: No rash or ecchymosis Remaining ROS negative.  Physical Exam: Blood pressure 131/73, pulse 78, temperature 98.2 F (36.8 C), temperature source Oral, resp. rate 18, height 5\' 1"  (1.549 m), weight 282 lb (127.914 kg), last menstrual period 12/26/2012. Wt Readings from Last 3 Encounters:  02/07/13 281 lb (127.461 kg)  02/06/13 282 lb (127.914 kg)  01/26/13 279 lb (126.554 kg)     General appearance: Pleasant, obese, African American woman HENNT: Pharynx no erythema or exudate Lymph nodes: No adenopathy Breasts: Lungs: Clear to auscultation resonant  to percussion Heart: Regular rhythm no murmur Abdomen: Soft, obese, nontender, no organomegaly Extremities: No edema, no calf tenderness Musculoskeletal: GU: Vascular: No cyanosis  Neurologic: No focal deficit Skin: No rash or ecchymosis  Lab Results: Lab Results: White count differential 67% neutrophils, 24% lymphocytes, 7% monocytes   Component Value Date   WBC 9.1 02/06/2013   HGB 12.0 02/06/2013   HCT 35.7 02/06/2013   MCV 85.5 02/06/2013   PLT 256 02/06/2013     Chemistry      Component Value Date/Time   NA 137 02/06/2013 1128   NA 132* 05/12/2012 0835   K 4.0 02/06/2013 1128   K 4.2 05/12/2012 0835   CL 104 02/06/2013 1128   CL 95* 05/12/2012 0835   CO2 23 02/06/2013 1128   CO2 20 05/12/2012 0835   BUN 7.8 02/06/2013 1128   BUN 31* 05/12/2012 0835   CREATININE 0.7 02/06/2013 1128   CREATININE 0.64 05/12/2012 0835   GLU 479* 03/01/2012 1210      Component Value Date/Time   CALCIUM 9.1 02/06/2013 1128   CALCIUM 10.4 05/12/2012 0835   ALKPHOS 77 02/06/2013 1128   ALKPHOS 79 05/12/2012 0835   AST 27 02/06/2013 1128   AST 12 05/12/2012 0835   ALT 33 02/06/2013 1128   ALT 16 05/12/2012 0835   BILITOT 0.52 02/06/2013 1128   BILITOT 0.6 05/12/2012 0835       Radiological Studies: US Ob Comp Less 14 Wks  21-Feb-2013   OBSTETRICAL ULTRASOUND: This exam was performed within a [facility]. The OB US report was generated in the [facility] system, and faxed to the ordering physician.  This report is also available in [employer] and in the [employer]. See [facility] Obstetric US report.  US Ob Transvaginal  01/25/2013   OBSTETRICAL ULTRASOUND: This exam was performed within a [facility]. The OB US report was generated in the [facility] system, and faxed to the ordering physician.   This report is also available in [employer] and in the [employer]. See [facility] Obstetric US report.   Impression: #1. Promyelocytic leukemia She remains  in a remission now out 25 months from diagnosis. Plan: Continue to monitor blood counts on a monthly basis.  #2. Pregnancy now at 8 weeks. Hopefully the pregnancy will not stir up things in her bone marrow. She is high risk due to her weight, diabetes, hypertension.  #3. History of MRSA left gluteal abscess during time of active treatment for her leukemia requiring surgical incision and drainage.  #4. Acute cholecystitis 2 months after diagnosis of leukemia requiring cholecystectomy May 2013  #5. Right lower lobe pneumonia at time of cholecystectomy.  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 Insulin-dependent diabetes  #8. Essential hypertension.  #9. History of Klebsiella bacteremia while under treatment for leukemia     CC:.    Levert Feinstein, MD 5/21/20149:12 PM

## 2013-02-14 ENCOUNTER — Encounter: Payer: Medicaid Other | Admitting: Family Medicine

## 2013-02-20 ENCOUNTER — Ambulatory Visit (INDEPENDENT_AMBULATORY_CARE_PROVIDER_SITE_OTHER): Payer: Medicaid Other | Admitting: Family Medicine

## 2013-02-20 ENCOUNTER — Encounter: Payer: Self-pay | Admitting: Family Medicine

## 2013-02-20 VITALS — BP 124/85 | Ht 62.25 in | Wt 283.9 lb

## 2013-02-20 DIAGNOSIS — O24919 Unspecified diabetes mellitus in pregnancy, unspecified trimester: Secondary | ICD-10-CM | POA: Insufficient documentation

## 2013-02-20 DIAGNOSIS — O10011 Pre-existing essential hypertension complicating pregnancy, first trimester: Secondary | ICD-10-CM

## 2013-02-20 DIAGNOSIS — O10019 Pre-existing essential hypertension complicating pregnancy, unspecified trimester: Secondary | ICD-10-CM | POA: Insufficient documentation

## 2013-02-20 DIAGNOSIS — O99210 Obesity complicating pregnancy, unspecified trimester: Secondary | ICD-10-CM | POA: Insufficient documentation

## 2013-02-20 DIAGNOSIS — O24911 Unspecified diabetes mellitus in pregnancy, first trimester: Secondary | ICD-10-CM

## 2013-02-20 DIAGNOSIS — IMO0002 Reserved for concepts with insufficient information to code with codable children: Secondary | ICD-10-CM

## 2013-02-20 DIAGNOSIS — E1165 Type 2 diabetes mellitus with hyperglycemia: Secondary | ICD-10-CM

## 2013-02-20 DIAGNOSIS — O99211 Obesity complicating pregnancy, first trimester: Secondary | ICD-10-CM

## 2013-02-20 DIAGNOSIS — O0990 Supervision of high risk pregnancy, unspecified, unspecified trimester: Secondary | ICD-10-CM

## 2013-02-20 LAB — POCT URINALYSIS DIP (DEVICE)
Glucose, UA: NEGATIVE mg/dL
Nitrite: NEGATIVE
Protein, ur: NEGATIVE mg/dL
Urobilinogen, UA: 0.2 mg/dL (ref 0.0–1.0)

## 2013-02-20 MED ORDER — GLUCOSE BLOOD VI STRP: ORAL_STRIP | Status: DC

## 2013-02-20 MED ORDER — ACCU-CHEK FASTCLIX LANCETS MISC: 1.0000 | Freq: Four times a day (QID) | Status: DC

## 2013-02-20 MED ORDER — INSULIN ASPART 100 UNIT/ML ~~LOC~~ SOLN: 20.0000 [IU] | Freq: Three times a day (TID) | SUBCUTANEOUS | Status: DC

## 2013-02-20 MED ORDER — INSULIN NPH (HUMAN) (ISOPHANE) 100 UNIT/ML ~~LOC~~ SUSP: 15.0000 [IU] | Freq: Two times a day (BID) | SUBCUTANEOUS | Status: DC

## 2013-02-20 MED ORDER — LABETALOL HCL 100 MG PO TABS: 100.0000 mg | ORAL_TABLET | Freq: Two times a day (BID) | ORAL | Status: DC

## 2013-02-20 MED ORDER — PRENATAL 27-0.8 MG PO TABS: 1.0000 | ORAL_TABLET | Freq: Every day | ORAL | Status: DC

## 2013-02-20 NOTE — Progress Notes (Signed)
Nutrition note: 1st visit consult Pt has h/o obesity & Type 2 DM. Pt has gained 7.9# @ [redacted]w[redacted]d, which is > expected. Pt does not check her blood sugars on a regular basis and stated she has never received nutr counseling for her DM. Pt reports eating 4-5x/d. Pt is taking PNV. Pt reports having some N/V but no heartburn. NKFA. Pt reports walking very minimally.  Pt received verbal & written education on GDM diet. Discussed tips to decrease N/V. Discussed wt gain goals of 11-20# or 0.5#/wk in 2nd & 3rd trimester. Pt agrees to follow GDM diet with 3 meals & 3 snacks and proper CHO/ protein combination.  Pt has WIC & plans to BF. F/u in 4-6 wks Blondell Reveal, MS, RD, LDN

## 2013-02-20 NOTE — Progress Notes (Signed)
New Ob transfer from [facility] DM--started NPH and Novolog--to see DM and education today.  Goals discussed BP is good on Labetalol Pos sickle screen-to check hgb electrophoresis MFM referral with genetics for first screen and sickle testing for FOB/baby Needs retinal scan at [facility]

## 2013-02-20 NOTE — Patient Instructions (Signed)
Following an appropriate diet and keeping your blood sugar under control is the most important thing to do for your health and that of your unborn baby.  Please check your blood sugar 4 times daily.  Please keep accurate BS logs and bring them with you to every visit.  Please bring your meter also.  Goals for Blood sugar should be: 1. Fasting (first thing in the morning before eating) should be less than 90.   2.  2 hours after meals should be less than 120.  Please eat 3 meals and 3 snacks.  Include protein (meat, dairy-cheese, eggs, nuts) with all meals.  Be mindful that carbohydrates increase your blood sugar.  Not just sweet food (cookies, cake, donuts, fruit, juice, soda) but also bread, pasta, rice, and potatoes.  You have to limit how many carbs you are eating.  Adding exercise, as little as 30 minutes a day can decrease your blood sugar.  Breastfeeding A change in hormones during your pregnancy causes growth of your breast tissue and an increase in number and size of milk ducts. The hormone prolactin allows proteins, sugars, and fats from your blood supply to make breast milk in your milk-producing glands. The hormone progesterone prevents breast milk from being released before the birth of your baby. After the birth of your baby, your progesterone level decreases allowing breast milk to be released. Thoughts of your baby, as well as his or her sucking or crying, can stimulate the release of milk from the milk-producing glands. Deciding to breastfeed (nurse) is one of the best choices you can make for you and your baby. The information that follows gives a brief review of the benefits, as well as other important skills to know about breastfeeding. BENEFITS OF BREASTFEEDING For your baby  The first milk (colostrum) helps your baby's digestive system function better.   There are antibodies in your milk that help your baby fight off infections.   Your baby has a lower incidence of  asthma, allergies, and sudden infant death syndrome (SIDS).   The nutrients in breast milk are better for your baby than infant formulas.  Breast milk improves your baby's brain development.   Your baby will have less gas, colic, and constipation.  Your baby is less likely to develop other conditions, such as childhood obesity, asthma, or diabetes mellitus. For you  Breastfeeding helps develop a very special bond between you and your baby.   Breastfeeding is convenient, always available at the correct temperature, and costs nothing.   Breastfeeding helps to burn calories and helps you lose the weight gained during pregnancy.   Breastfeeding makes your uterus contract back down to normal size faster and slows bleeding following delivery.   Breastfeeding mothers have a lower risk of developing osteoporosis or breast or ovarian cancer later in life.  BREASTFEEDING FREQUENCY  A healthy, full-term baby may breastfeed as often as every hour or space his or her feedings to every 3 hours. Breastfeeding frequency will vary from baby to baby.   Newborns should be fed no less than every 2 3 hours during the day and every 4 5 hours during the night. You should breastfeed a minimum of 8 feedings in a 24 hour period.  Awaken your baby to breastfeed if it has been 3 4 hours since the last feeding.  Breastfeed when you feel the need to reduce the fullness of your breasts or when your newborn shows signs of hunger. Signs that your baby may be hungry  include:  Increased alertness or activity.  Stretching.  Movement of the head from side to side.  Movement of the head and opening of the mouth when the corner of the mouth or cheek is stroked (rooting).  Increased sucking sounds, smacking lips, cooing, sighing, or squeaking.  Hand-to-mouth movements.  Increased sucking of fingers or hands.  Fussing.  Intermittent crying.  Signs of extreme hunger will require calming and consoling  before you try to feed your baby. Signs of extreme hunger may include:  Restlessness.  A loud, strong cry.  Screaming.  Frequent feeding will help you make more milk and will help prevent problems, such as sore nipples and engorgement of the breasts.  BREASTFEEDING   Whether lying down or sitting, be sure that the baby's abdomen is facing your abdomen.   Support your breast with 4 fingers under your breast and your thumb above your nipple. Make sure your fingers are well away from your nipple and your baby's mouth.   Stroke your baby's lips gently with your finger or nipple.   When your baby's mouth is open wide enough, place all of your nipple and as much of the colored area around your nipple (areola) as possible into your baby's mouth.  More areola should be visible above his or her upper lip than below his or her lower lip.  Your baby's tongue should be between his or her lower gum and your breast.  Ensure that your baby's mouth is correctly positioned around the nipple (latched). Your baby's lips should create a seal on your breast.  Signs that your baby has effectively latched onto your nipple include:  Tugging or sucking without pain.  Swallowing heard between sucks.  Absent click or smacking sound.  Muscle movement above and in front of his or her ears with sucking.  Your baby must suck about 2 3 minutes in order to get your milk. Allow your baby to feed on each breast as long as he or she wants. Nurse your baby until he or she unlatches or falls asleep at the first breast, then offer the second breast.  Signs that your baby is full and satisfied include:  A gradual decrease in the number of sucks or complete cessation of sucking.  Falling asleep.  Extension or relaxation of his or her body.  Retention of a small amount of milk in his or her mouth.  Letting go of your breast by himself or herself.  Signs of effective breastfeeding in you  include:  Breasts that have increased firmness, weight, and size prior to feeding.  Breasts that are softer after nursing.  Increased milk volume, as well as a change in milk consistency and color by the 5th day of breastfeeding.  Breast fullness relieved by breastfeeding.  Nipples are not sore, cracked, or bleeding.  If needed, break the suction by putting your finger into the corner of your baby's mouth and sliding your finger between his or her gums. Then, remove your breast from his or her mouth.  It is common for babies to spit up a small amount after a feeding.  Babies often swallow air during feeding. This can make babies fussy. Burping your baby between breasts can help with this.  Vitamin D supplements are recommended for babies who get only breast milk.  Avoid using a pacifier during your baby's first 4 6 weeks.  Avoid supplemental feedings of water, formula, or juice in place of breastfeeding. Breast milk is all the food your  baby needs. It is not necessary for your baby to have water or formula. Your breasts will make more milk if supplemental feedings are avoided during the early weeks. HOW TO TELL WHETHER YOUR BABY IS GETTING ENOUGH BREAST MILK Wondering whether or not your baby is getting enough milk is a common concern among mothers. You can be assured that your baby is getting enough milk if:   Your baby is actively sucking and you hear swallowing.   Your baby seems relaxed and satisfied after a feeding.   Your baby nurses at least 8 12 times in a 24 hour time period.  During the first 34 38 days of age:  Your baby is wetting at least 3 5 diapers in a 24 hour period. The urine should be clear and pale yellow.  Your baby is having at least 3 4 stools in a 24 hour period. The stool should be soft and yellow.  At 71 34 days of age, your baby is having at least 3 6 stools in a 24 hour period. The stool should be seedy and yellow by 68 days of age.  Your baby has a  weight loss less than 7 10% during the first 27 days of age.  Your baby does not lose weight after 41 57 days of age.  Your baby gains 4 7 ounces each week after he or she is 4 days of age.  Your baby gains weight by 33 days of age and is back to birth weight within 2 weeks. ENGORGEMENT In the first week after your baby is born, you may experience extremely full breasts (engorgement). When engorged, your breasts may feel heavy, warm, or tender to the touch. Engorgement peaks within 24 48 hours after delivery of your baby.  Engorgement may be reduced by:  Continuing to breastfeed.  Increasing the frequency of breastfeeding.  Taking warm showers or applying warm, moist heat to your breasts just before each feeding. This increases circulation and helps the milk flow.   Gently massaging your breast before and during the feedings. With your fingertips, massage from your chest wall towards your nipple in a circular motion.   Ensuring that your baby empties at least one breast at every feeding. It also helps to start the next feeding on the opposite breast.   Expressing breast milk by hand or by using a breast pump to empty the breasts if your baby is sleepy, or not nursing well. You may also want to express milk if you are returning to work oryou feel you are getting engorged.  Ensuring your baby is latched on and positioned properly while breastfeeding. If you follow these suggestions, your engorgement should improve in 24 48 hours. If you are still experiencing difficulty, call your lactation consultant or caregiver.  CARING FOR YOURSELF Take care of your breasts.  Bathe or shower daily.   Avoid using soap on your nipples.   Wear a supportive bra. Avoid wearing underwire style bras.  Air dry your nipples for a 3 after each feeding.   Use only cotton bra pads to absorb breast milk leakage. Leaking of breast milk between feedings is normal.   Use only pure lanolin on  your nipples after nursing. You do not need to wash it off before feeding your baby again. Another option is to express a few drops of breast milk and gently massage that milk into your nipples.  Continue breast self-awareness checks. Take care of yourself.  Eat healthy foods. Alternate  3 meals with 3 snacks.  Avoid foods that you notice affect your baby in a bad way.  Drink milk, fruit juice, and water to satisfy your thirst (about 8 glasses a day).   Rest often, relax, and take your prenatal vitamins to prevent fatigue, stress, and anemia.  Avoid chewing and smoking tobacco.  Avoid alcohol and drug use.  Take over-the-counter and prescribed medicine only as directed by your caregiver or pharmacist. You should always check with your caregiver or pharmacist before taking any new medicine, vitamin, or herbal supplement.  Know that pregnancy is possible while breastfeeding. If desired, talk to your caregiver about family planning and safe birth control methods that may be used while breastfeeding. SEEK MEDICAL CARE IF:   You feel like you want to stop breastfeeding or have become frustrated with breastfeeding.  You have painful breasts or nipples.  Your nipples are cracked or bleeding.  Your breasts are red, tender, or warm.  You have a swollen area on either breast.  You have a fever or chills.  You have nausea or vomiting.  You have drainage from your nipples.  Your breasts do not become full before feedings by the 5th day after delivery.  You feel sad and depressed.  Your baby is too sleepy to eat well.  Your baby is having trouble sleeping.   Your baby is wetting less than 3 diapers in a 24 hour period.  Your baby has less than 3 stools in a 24 hour period.  Your baby's skin or the white part of his or her eyes becomes more yellow.   Your baby is not gaining weight by 22 days of age. MAKE SURE YOU:   Understand these instructions.  Will watch your  condition.  Will get help right away if you are not doing well or get worse. Document Released: 09/07/2005 Document Revised: 06/01/2012 Document Reviewed: 04/13/2012 [facility] Patient Information 2014 [place], [place]. Gestational Diabetes Mellitus Gestational diabetes mellitus, often simply referred to as gestational diabetes, is a type of diabetes that some women develop during pregnancy. In gestational diabetes, the pancreas does not make enough insulin (a hormone), the cells are less responsive to the insulin that is made (insulin resistance), or both.Normally, insulin moves sugars from food into the tissue cells. The tissue cells use the sugars for energy. The lack of insulin or the lack of normal response to insulin causes excess sugars to build up in the blood instead of going into the tissue cells. As a result, high blood sugar (hyperglycemia) develops. The effect of high sugar (glucose) levels can cause many complications.  RISK FACTORS You have an increased chance of developing gestational diabetes if you have a family history of diabetes and also have one or more of the following risk factors:  A body mass index over 30 (obesity).  A previous pregnancy with gestational diabetes.  An older age at the time of pregnancy. If blood glucose levels are kept in the normal range during pregnancy, women can have a healthy pregnancy. If your blood glucose levels are not well controlled, there may be risks to you, your unborn baby (fetus), your labor and delivery, or your newborn baby.  SYMPTOMS  If symptoms are experienced, they are much like symptoms you would normally expect during pregnancy. The symptoms of gestational diabetes include:   Increased thirst (polydipsia).  Increased urination (polyuria).  Increased urination during the night (nocturia).  Weight loss. This weight loss may be rapid.  Frequent, recurring infections.  Tiredness (fatigue).  Weakness.  Vision changes, such  as blurred vision.  Fruity smell to your breath.  Abdominal pain. DIAGNOSIS Diabetes is diagnosed when blood glucose levels are increased. Your blood glucose level may be checked by one or more of the following blood tests:  A fasting blood glucose test. You will not be allowed to eat for at least 8 hours before a blood sample is taken.  A random blood glucose test. Your blood glucose is checked at any time of the day regardless of when you ate.  A hemoglobin A1c blood glucose test. A hemoglobin A1c test provides information about blood glucose control over the previous 3 months.  An oral glucose tolerance test (OGTT). Your blood glucose is measured after you have not eaten (fasted) for 1 3 hours and then after you drink a glucose-containing beverage. Since the hormones that cause insulin resistance are highest at about 24 28 weeks of a pregnancy, an OGTT is usually performed during that time. If you have risk factors for gestational diabetes, your caregiver may test you for gestational diabetes earlier than 24 weeks of pregnancy. TREATMENT   You will need to take diabetes medicine or insulin daily to keep blood glucose levels in the desired range.  You will need to match insulin dosing with exercise and healthy food choices. The treatment goal is to maintain the before meal (preprandial), bedtime, and overnight blood glucose level at 60 99 mg/dL during pregnancy. The treatment goal is to further maintain peak after meal blood sugar (postprandial glucose) level at 100 140 mg/dL.  HOME CARE INSTRUCTIONS   Have your hemoglobin A1c level checked twice a year.  Perform daily blood glucose monitoring as directed by your caregiver. It is common to perform frequent blood glucose monitoring.  Monitor urine ketones when you are ill and as directed by your caregiver.  Take your diabetes medicine and insulin as directed by your caregiver to maintain your blood glucose level in the desired  range.  Never run out of diabetes medicine or insulin. It is needed every day.  Adjust insulin based on your intake of carbohydrates. Carbohydrates can raise blood glucose levels but need to be included in your diet. Carbohydrates provide vitamins, minerals, and fiber which are an essential part of a healthy diet. Carbohydrates are found in fruits, vegetables, whole grains, dairy products, legumes, and foods containing added sugars.    Eat healthy foods. Alternate 3 meals with 3 snacks.  Maintain a healthy weight gain. The usual total expected weight gain varies according to your prepregnancy body mass index (BMI).  Carry a medical alert card or wear your medical alert jewelry.  Carry a 15 gram carbohydrate snack with you at all times to treat low blood glucose (hypoglycemia). Some examples of 15 gram carbohydrate snacks include:  Glucose tablets, 3 or 4   Glucose gel, 15 gram tube  Raisins, 2 tablespoons (24 g)  Jelly beans, 6  Animal crackers, 8  Fruit juice, regular soda, or low fat milk, 4 ounces (120 mL)  Gummy treats, 9    Recognize hypoglycemia. Hypoglycemia during pregnancy occurs with blood glucose levels of 60 mg/dL and below. The risk for hypoglycemia increases when fasting or skipping meals, during or after intense exercise, and during sleep. Hypoglycemia symptoms can include:  Tremors or shakes.  Decreased ability to concentrate.  Sweating.  Increased heart rate.  Headache.  Dry mouth.  Hunger.  Irritability.  Anxiety.  Restless sleep.  Altered speech or coordination.  Confusion.  Treat hypoglycemia promptly. If you are alert and able to safely swallow, follow the 15:15 rule:  Take 15 20 grams of rapid-acting glucose or carbohydrate. Rapid-acting options include glucose gel, glucose tablets, or 4 ounces (120 mL) of fruit juice, regular soda, or low fat milk.  Check your blood glucose level 15 minutes after taking the glucose.   Take 15 20  grams more of glucose if the repeat blood glucose level is still 70 mg/dL or below.  Eat a meal or snack within 1 hour once blood glucose levels return to normal.  Be alert to polyuria and polydipsia which are early signs of hyperglycemia. An early awareness of hyperglycemia allows for prompt treatment. Treat hyperglycemia as directed by your caregiver.  Engage in at least 30 minutes of physical activity a day or as directed by your caregiver. Ten minutes of physical activity timed 30 minutes after each meal is encouraged to control postprandial blood glucose levels.  Adjust your insulin dosing and food intake as needed if you start a new exercise or sport.  Follow your sick day plan at any time you are unable to eat or drink as usual.  Avoid tobacco and alcohol use.  Follow up with your caregiver regularly.  Follow the advice of your caregiver regarding your prenatal and post-delivery (postpartum) appointments, meal planning, exercise, medicines, vitamins, blood tests, other medical tests, and physical activities.  Perform daily skin and foot care. Examine your skin and feet daily for cuts, bruises, redness, nail problems, bleeding, blisters, or sores.  Brush your teeth and gums at least twice a day and floss at least once a day. Follow up with your dentist regularly.  Schedule an eye exam during the first trimester of your pregnancy or as directed by your caregiver.  Share your diabetes management plan with your workplace or school.  Stay up-to-date with immunizations.  Learn to manage stress.  Obtain ongoing diabetes education and support as needed. SEEK MEDICAL CARE IF:   You are unable to eat food or drink fluids for more than 6 hours.  You have nausea and vomiting for more than 6 hours.  You have a blood glucose level of 200 mg/dL and you have ketones in your urine.  There is a change in mental status.  You develop vision problems.  You have a persistent  headache.  You have upper abdominal pain or discomfort.  You develop an additional serious illness.  You have diarrhea for more than 6 hours.  You have been sick or have had a fever for a couple of days and are not getting better. SEEK IMMEDIATE MEDICAL CARE IF:   You have difficulty breathing.  You no longer feel the baby moving.  You are bleeding or have discharge from your vagina.  You start having premature contractions or labor. MAKE SURE YOU:  Understand these instructions.  Will watch your condition.  Will get help right away if you are not doing well or get worse. Document Released: 12/14/2000 Document Revised: 06/01/2012 Document Reviewed: 04/05/2012 [facility] Patient Information 2014 [place], [place].

## 2013-02-20 NOTE — Progress Notes (Signed)
Pulse: 98 Needs refill on Labetalol and PNV. Has cramps.  Pt reports that she has fallen twice.

## 2013-02-20 NOTE — Progress Notes (Signed)
Reviewed portion control and small snacks needed.  Told to check glucose fasting, and 2 hrs post-prandial and to bring her meter and log book to next appt. Pt with pre-existing DM.  She has medicaid now and was given an Scientist, product/process development. Pt will need Rx for appropriate test strips and lancets. Pt very responsive to teaching.  Gave her cbg parameters for fasting and 2 hr post-prandial. Pt verbalized understanding.Thank you, Lenor Coffin, RN, CNS, Diabetes Coordinator [phone])

## 2013-02-20 NOTE — Progress Notes (Signed)
MFM referral scheduled 02/1013 at 1 pm. [facility] appointment for a Retinal Scan scheduled 02/21/13 at 830 am.

## 2013-02-21 ENCOUNTER — Ambulatory Visit: Payer: Medicaid Other | Admitting: Family Medicine

## 2013-02-21 ENCOUNTER — Encounter (HOSPITAL_COMMUNITY): Payer: Self-pay | Admitting: Family Medicine

## 2013-02-24 ENCOUNTER — Other Ambulatory Visit: Payer: Self-pay | Admitting: Obstetrics and Gynecology

## 2013-02-24 ENCOUNTER — Ambulatory Visit (INDEPENDENT_AMBULATORY_CARE_PROVIDER_SITE_OTHER): Payer: Medicaid Other | Admitting: *Deleted

## 2013-02-24 ENCOUNTER — Ambulatory Visit: Payer: Medicaid Other | Admitting: Family Medicine

## 2013-02-24 DIAGNOSIS — Z3682 Encounter for antenatal screening for nuchal translucency: Secondary | ICD-10-CM

## 2013-02-24 DIAGNOSIS — E1165 Type 2 diabetes mellitus with hyperglycemia: Secondary | ICD-10-CM

## 2013-02-24 NOTE — Progress Notes (Signed)
Patient here today for retinal scan only. Scan was performed and patient instructed she will be call with results once they come in, in about 1 week.Busick, Rodena Medin

## 2013-02-27 ENCOUNTER — Other Ambulatory Visit: Payer: Self-pay | Admitting: Family Medicine

## 2013-02-27 ENCOUNTER — Encounter: Payer: Medicaid Other | Attending: Family Medicine | Admitting: *Deleted

## 2013-02-27 DIAGNOSIS — I10 Essential (primary) hypertension: Secondary | ICD-10-CM | POA: Insufficient documentation

## 2013-02-27 DIAGNOSIS — E119 Type 2 diabetes mellitus without complications: Secondary | ICD-10-CM | POA: Insufficient documentation

## 2013-02-27 DIAGNOSIS — Z713 Dietary counseling and surveillance: Secondary | ICD-10-CM | POA: Insufficient documentation

## 2013-02-28 ENCOUNTER — Ambulatory Visit (HOSPITAL_COMMUNITY)
Admission: RE | Admit: 2013-02-28 | Discharge: 2013-02-28 | Disposition: A | Discharge status: Home / Self Care | Payer: Medicaid Other | Source: Ambulatory Visit | Attending: Obstetrics and Gynecology | Admitting: Obstetrics and Gynecology

## 2013-02-28 ENCOUNTER — Encounter: Payer: Self-pay | Admitting: Obstetrics and Gynecology

## 2013-02-28 ENCOUNTER — Ambulatory Visit (HOSPITAL_COMMUNITY)
Admission: RE | Admit: 2013-02-28 | Discharge: 2013-02-28 | Disposition: A | Discharge status: Home / Self Care | Payer: Medicaid Other | Source: Ambulatory Visit | Attending: Family Medicine | Admitting: Family Medicine

## 2013-02-28 VITALS — BP 121/71 | HR 81 | Wt 289.0 lb

## 2013-02-28 DIAGNOSIS — O10019 Pre-existing essential hypertension complicating pregnancy, unspecified trimester: Secondary | ICD-10-CM | POA: Insufficient documentation

## 2013-02-28 DIAGNOSIS — Z3689 Encounter for other specified antenatal screening: Secondary | ICD-10-CM | POA: Insufficient documentation

## 2013-02-28 DIAGNOSIS — O24919 Unspecified diabetes mellitus in pregnancy, unspecified trimester: Secondary | ICD-10-CM | POA: Insufficient documentation

## 2013-02-28 DIAGNOSIS — E1165 Type 2 diabetes mellitus with hyperglycemia: Secondary | ICD-10-CM

## 2013-02-28 DIAGNOSIS — O99210 Obesity complicating pregnancy, unspecified trimester: Secondary | ICD-10-CM | POA: Insufficient documentation

## 2013-02-28 DIAGNOSIS — I10 Essential (primary) hypertension: Secondary | ICD-10-CM

## 2013-02-28 DIAGNOSIS — Z3682 Encounter for antenatal screening for nuchal translucency: Secondary | ICD-10-CM

## 2013-02-28 DIAGNOSIS — E669 Obesity, unspecified: Secondary | ICD-10-CM | POA: Insufficient documentation

## 2013-02-28 DIAGNOSIS — O3510X0 Maternal care for (suspected) chromosomal abnormality in fetus, unspecified, not applicable or unspecified: Secondary | ICD-10-CM | POA: Insufficient documentation

## 2013-02-28 DIAGNOSIS — O351XX0 Maternal care for (suspected) chromosomal abnormality in fetus, not applicable or unspecified: Secondary | ICD-10-CM | POA: Insufficient documentation

## 2013-02-28 LAB — CBC
Hemoglobin: 11.6 g/dL — ABNORMAL LOW (ref 12.0–15.0)
MCH: 29.1 pg (ref 26.0–34.0)
MCHC: 35 g/dL (ref 30.0–36.0)
MCV: 83 fL (ref 78.0–100.0)

## 2013-02-28 NOTE — Progress Notes (Signed)
Melissa Washington  was seen today for an ultrasound appointment.  See full report in [facility].  Impression: Single IUP at 12 5/7 weeks Normal NT. Unable to visualized nasal bone. First trimester aneuploidy screen performed as noted above.    Recommendations: Please do not draw triple/quad screen, though patient should be offered [facility] for neural tube defect screening.  Recommend ultrasound for fetal anatomy at approximately [redacted] weeks gestation.  Alpha Gula, MD

## 2013-02-28 NOTE — Progress Notes (Signed)
Genetic Counseling  High-Risk Gestation Note  Appointment Date:  02/28/2013 Referred By: Catalina Antigua, MD Date of Birth:  03-15-1986 Partner:  Melissa Washington     Pregnancy History: [id number] Estimated Date of Delivery: 09/07/13 Estimated Gestational Age: [redacted]w[redacted]d Attending: Alpha Gula, MD  I met with [name] and her partner, Mr. Melissa Washington, for genetic counseling regarding Melissa Washington sickle cell anemia screening results.  [name] was offered testing for sickle cell anemia (SCA) because this condition occurs more commonly among individuals with African-American ancestry. Her Hemoglobin Solubility result was positive for Hemoglobin S. We reviewed that this does not distinguish between sickle cell trait and sickle cell anemia. Melissa Washington reported that she has been told by her mother that she has sickle cell trait. Given this report and given Melissa Washington's reported medical history, we discussed that her result is most consistent with sickle cell trait. However, hemoglobin electrophoresis is available to clarify her sickle cell status. Melissa Washington elected to proceed with hemoglobin electrophoresis at this time.   We discussed that SCA affects the shape and function of the red blood cell by producing abnormal hemoglobin. They were counseled that hemoglobin is a protein in the RBCs that carries oxygen to the body's organs. Individuals who have SCA have a changes within the genes that codes for hemoglobin. This couple was counseled that SCA is inherited in an autosomal recessive manner, and occurs when both copies of the hemoglobin gene are changed and produce an abnormal hemoglobin S. Typically, one abnormal gene for the production of hemoglobin S is inherited from each parent. A carrier of SCA has one altered copy of the gene for hemoglobin and one typical working copy.  Carriers of recessive conditions typically do not have symptoms related to the condition because  they still have one functioning copy of the gene, and thus some production of the typical protein coded for by that gene. We discussed that when both parents carry sickle cell trait, or hemoglobin variant, the chance for sickle cell anemia (or hemoglobinopathy) is 1 in 4 (25%) in each pregnancy together. If one parents has sickle cell trait, but the other parent has normal hemoglobin, then the pregnancy is not at risk to inherit sickle cell anemia but has a 1 in 2 (50%) chance to inherit sickle cell trait.   Given the recessive inheritance, we discussed the importance of understanding Melissa Washington' carrier status in order to accurately predict the risk of a hemoglobinopathy in the fetus. He reported that he has not been screened for sickle cell in the past, and he reported no known family history of sickle cell anemia or sickle cell trait.  We discussed that other hemoglobin variants (hemoglobin C), when combined with hemoglobin S, can cause a medically significant hemoglobinopathy and that screening is available for other hemoglobin variants also. We discussed that prior to screening, the father of the pregnancy would have the general population chance to carry sickle cell trait, given the reported family history and no known consanguinity to the patient. Thus, prior to screening for Melissa Washington, the chance for sickle cell anemia in the current pregnancy is approximately 1 in 48.   Melissa Washington was offered testing (hemoglobin electrophoresis and complete blood count) to determine whether he has any hemoglobin variant, including SCT. He elected to proceed with this testing today.   We reviewed that early diagnosis of SCA is facilitated by newborn screening before the onset of symptoms.  Clinical manifestations usually present  in the first or second year of life.  We discussed the option of amniocentesis as a way to determine, prenatally, if a baby has SCA or not, when both parents are identified to have sickle  cell trait.  We discussed the risks, benefits, and limitations of amniocentesis, including the approximate 1 in 300-500 risk for complications, including spontaneous pregnancy loss.  They understands that although the ultrasound may appear normal, the risk of anomalies cannot be completely eliminated, and that a baby with SCA would not appear different on a prenatal ultrasound.  Melissa Washington also elected to have a First trimester screen today. She was counseled that screening tests are used to modify a patient's a priori risk for aneuploidy, typically based on age. This estimate provides a pregnancy specific risk assessment. We reviewed the benefits and limitations of each option.  Nuchal translucency ultrasound was performed today.  The report will be documented separately.   She understands that screening tests cannot rule out all birth defects or genetic syndromes. The patient was advised of this limitation and states she still does not want additional testing at this time.   Both family histories were reviewed and found to be contributory for the father of the pregnancy's paternal half-nephew (his paternal half-brother's son) born with his "tongue upside down."  He has had surgery and requires the use of a G-tube for feeding. He is currently age 61 years old and reportedly has no mental delays. He is otherwise healthy. Limited information was known regarding this history at the time of today's visit. We discussed that additional information is needed regarding this individual in order to accurately assess recurrence risk for relatives. Without further information regarding the provided family history, an accurate genetic risk cannot be calculated. Further genetic counseling is warranted if more information is obtained.  Melissa Washington denied exposure to environmental toxins or chemical agents. She denied the use of alcohol, tobacco or street drugs. She denied significant viral illnesses  during the course of her pregnancy. Her medical and surgical histories were contributory for diabetes for which she is treated with novolog and humulin, and hypertension for which she is treated with labetalol, and a history of leukemia. 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 Her last treatment ended in October 2013, and she is reported to be in remission.    I counseled this couple regarding the above risks and available options.  The approximate face-to-face time with the genetic counselor was 40 minutes.    Clydie Braun Sondi Desch 02/28/2013

## 2013-03-01 ENCOUNTER — Telehealth: Payer: Self-pay | Admitting: *Deleted

## 2013-03-01 DIAGNOSIS — O99211 Obesity complicating pregnancy, first trimester: Secondary | ICD-10-CM

## 2013-03-01 DIAGNOSIS — O24911 Unspecified diabetes mellitus in pregnancy, first trimester: Secondary | ICD-10-CM

## 2013-03-01 DIAGNOSIS — O0990 Supervision of high risk pregnancy, unspecified, unspecified trimester: Secondary | ICD-10-CM

## 2013-03-01 DIAGNOSIS — E1165 Type 2 diabetes mellitus with hyperglycemia: Secondary | ICD-10-CM

## 2013-03-01 DIAGNOSIS — O10011 Pre-existing essential hypertension complicating pregnancy, first trimester: Secondary | ICD-10-CM

## 2013-03-01 MED ORDER — GLUCOSE BLOOD VI STRP: ORAL_STRIP | Status: DC

## 2013-03-01 NOTE — Telephone Encounter (Signed)
Received a call from pharmacy stating they received a prescription 02/20/13 for accucheck smartview test strips and instructions were use as instructed- she has medicaid- we have to have exact directions per medicaid- states today they also got prescription for lancets -we just need a new prescription sent stating 4 times a day or whatever directions are.

## 2013-03-02 ENCOUNTER — Telehealth: Payer: Self-pay | Admitting: *Deleted

## 2013-03-02 ENCOUNTER — Telehealth (HOSPITAL_COMMUNITY): Payer: Self-pay | Admitting: MS"

## 2013-03-02 DIAGNOSIS — O99211 Obesity complicating pregnancy, first trimester: Secondary | ICD-10-CM

## 2013-03-02 DIAGNOSIS — E1165 Type 2 diabetes mellitus with hyperglycemia: Secondary | ICD-10-CM

## 2013-03-02 DIAGNOSIS — O24911 Unspecified diabetes mellitus in pregnancy, first trimester: Secondary | ICD-10-CM

## 2013-03-02 DIAGNOSIS — O10011 Pre-existing essential hypertension complicating pregnancy, first trimester: Secondary | ICD-10-CM

## 2013-03-02 DIAGNOSIS — O0990 Supervision of high risk pregnancy, unspecified, unspecified trimester: Secondary | ICD-10-CM

## 2013-03-02 LAB — HEMOGLOBINOPATHY EVALUATION
Hgb A2 Quant: 3.2 % (ref 2.2–3.2)
Hgb A: 55.8 % — ABNORMAL LOW (ref 96.8–97.8)

## 2013-03-02 MED ORDER — GLUCOSE BLOOD VI STRP: 1.0000 | ORAL_STRIP | Freq: Four times a day (QID) | Status: DC

## 2013-03-02 MED ORDER — ACCU-CHEK FASTCLIX LANCETS MISC: 1.0000 | Freq: Four times a day (QID) | Status: DC

## 2013-03-02 NOTE — Telephone Encounter (Signed)
Pt left message that her pharmacy needs instructions for her lancets and test strips. Orders corrected and sent to her pharmacy.

## 2013-03-02 NOTE — Telephone Encounter (Signed)
Called [name] and her partner, Mr. Cleon Gustin regarding results of hemoglobin electrophoresis for herself and for the father of the pregnancy, Mr. Cleon Gustin. Results were discussed with both individuals via speaker phone.  Reviewed with [name] that her hemoglobin electrophoresis confirmed the presence of sickle cell trait, denoted as Hb AS. Mr. Juanetta Gosling' hemoglobin electrophoresis results indicate the presence of normal adult hemoglobin (Hb AA). Thus, he does not appear to have sickle cell trait or other hemoglobin variant. Given these results and the previously reported information, we discussed that the current pregnancy does not appear to be at increased risk for sickle cell anemia, but will have a 1 in 2 (50%) chance to inherit sickle cell trait. The couple was very happy to hear this information. They were encouraged to contact our office with additional questions or concerns.    Clydie Braun Anaise Sterbenz 4:37 PM 03/02/2013

## 2013-03-06 ENCOUNTER — Encounter: Payer: Medicaid Other | Admitting: *Deleted

## 2013-03-06 ENCOUNTER — Ambulatory Visit (INDEPENDENT_AMBULATORY_CARE_PROVIDER_SITE_OTHER): Payer: Medicaid Other | Admitting: Obstetrics and Gynecology

## 2013-03-06 VITALS — BP 127/83 | Temp 99.1°F | Wt 286.0 lb

## 2013-03-06 DIAGNOSIS — E1165 Type 2 diabetes mellitus with hyperglycemia: Secondary | ICD-10-CM

## 2013-03-06 DIAGNOSIS — C9241 Acute promyelocytic leukemia, in remission: Secondary | ICD-10-CM

## 2013-03-06 DIAGNOSIS — O0990 Supervision of high risk pregnancy, unspecified, unspecified trimester: Secondary | ICD-10-CM

## 2013-03-06 DIAGNOSIS — IMO0001 Reserved for inherently not codable concepts without codable children: Secondary | ICD-10-CM

## 2013-03-06 DIAGNOSIS — O99212 Obesity complicating pregnancy, second trimester: Secondary | ICD-10-CM

## 2013-03-06 DIAGNOSIS — E669 Obesity, unspecified: Secondary | ICD-10-CM

## 2013-03-06 DIAGNOSIS — O10011 Pre-existing essential hypertension complicating pregnancy, first trimester: Secondary | ICD-10-CM

## 2013-03-06 DIAGNOSIS — C9201 Acute myeloblastic leukemia, in remission: Secondary | ICD-10-CM

## 2013-03-06 DIAGNOSIS — O10019 Pre-existing essential hypertension complicating pregnancy, unspecified trimester: Secondary | ICD-10-CM

## 2013-03-06 DIAGNOSIS — O24912 Unspecified diabetes mellitus in pregnancy, second trimester: Secondary | ICD-10-CM

## 2013-03-06 DIAGNOSIS — O24919 Unspecified diabetes mellitus in pregnancy, unspecified trimester: Secondary | ICD-10-CM

## 2013-03-06 LAB — POCT URINALYSIS DIP (DEVICE)
Bilirubin Urine: NEGATIVE
Glucose, UA: NEGATIVE mg/dL
Hgb urine dipstick: NEGATIVE
Ketones, ur: 40 mg/dL — AB
Nitrite: NEGATIVE
Protein, ur: NEGATIVE mg/dL
Specific Gravity, Urine: 1.015 (ref 1.005–1.030)
Urobilinogen, UA: 0.2 mg/dL (ref 0.0–1.0)
pH: 5.5 (ref 5.0–8.0)

## 2013-03-06 NOTE — Progress Notes (Signed)
Pulse- 90 Patient reports a lot of cramping in lower abdomen

## 2013-03-06 NOTE — Progress Notes (Signed)
Patient describes round ligament pain. Patient otherwise without any complaints. Patient did not bring CBG log or meter but reports that they have been "good". She states highest fasting in 98 and highest postprandial is 138. Reminded patient to bring log and meter at every visit. BP well controlled on current labetalol dose.

## 2013-03-06 NOTE — Progress Notes (Signed)
Pt returned after starting her glucose checks fasting and 2 hr post-prandial.  However, she did not bring her meter nor log book. Pt has not been eating at scheduled times.  States she is not hungry all day and eats the most at 8pm. Reviewed with her the need to take her insulin at prescribed times, to take NPH no later than 8am and by 10 pm. To cover correction not before meals except for breakfast when she checks her fasting. To eat regular meals, correct 2 hr pp and to cover meals with ratio of 1 unit per 7 grams. To bring meter and or logbook next visit. Thank you, Lenor Coffin, RN, Diabetes CNS [phone])

## 2013-03-08 ENCOUNTER — Encounter: Payer: Self-pay | Admitting: Family Medicine

## 2013-03-08 ENCOUNTER — Encounter: Payer: Self-pay | Admitting: *Deleted

## 2013-03-20 ENCOUNTER — Ambulatory Visit (INDEPENDENT_AMBULATORY_CARE_PROVIDER_SITE_OTHER): Payer: Medicaid Other | Admitting: Advanced Practice Midwife

## 2013-03-20 VITALS — BP 121/84 | Temp 98.2°F | Wt 290.0 lb

## 2013-03-20 DIAGNOSIS — O24919 Unspecified diabetes mellitus in pregnancy, unspecified trimester: Secondary | ICD-10-CM

## 2013-03-20 LAB — POCT URINALYSIS DIP (DEVICE)
Leukocytes, UA: NEGATIVE
Nitrite: NEGATIVE
Protein, ur: NEGATIVE mg/dL
Urobilinogen, UA: 0.2 mg/dL (ref 0.0–1.0)
pH: 5.5 (ref 5.0–8.0)

## 2013-03-20 MED ORDER — INSULIN NPH (HUMAN) (ISOPHANE) 100 UNIT/ML ~~LOC~~ SUSP: 17.0000 [IU] | Freq: Every day | SUBCUTANEOUS | Status: DC

## 2013-03-20 NOTE — Progress Notes (Signed)
Brought meter today. FBSs usually done at around 10am when she gets up. Most are in low 100s. Other values within range except about a third of them are 127-130s.  Admits to not eating correctly those times. Had one morning with 167 when she ate pancakes and syrup.  Advised not to eat that again.  Will increase PM NPH dose to 17 units to cover Fastings. Discussed diet. Has [place] planned for July (anatomy)

## 2013-03-20 NOTE — Progress Notes (Signed)
Pulse- 94 

## 2013-03-20 NOTE — Patient Instructions (Signed)
Pregnancy - Second Trimester The second trimester is the period between 13 to 27 weeks of your pregnancy. It is important to follow your doctor's instructions. HOME CARE   Do not smoke.  Do not drink alcohol or use drugs.  Only take medicine as told by your doctor.  Take prenatal vitamins as told. The vitamin should contain 1 milligram of folic acid.  Exercise.  Eat healthy foods. Eat regular, well-balanced meals.  You can have sex (intercourse) if there are no other problems with the pregnancy.  Do not use hot tubs, steam rooms, or saunas.  Wear a seat belt while driving.  Avoid raw meat, uncooked cheese, and litter boxes and soil used by cats.  Visit your dentist. Cleanings are okay. GET HELP RIGHT AWAY IF:   You have a temperature by mouth above 102 F (38.9 C), not controlled by medicine.  Fluid is coming from your vagina.  Blood is coming from your vagina. Light spotting is common, especially after sex (intercourse).  You have a bad smelling fluid (discharge) coming from the vagina. The fluid changes from clear to white.  You still feel sick to your stomach (nauseous).  You throw up (vomit) blood.  You lose or gain more than 2 pounds (0.9 kilograms) of weight in a week, or as suggested by your doctor.  Your face, hands, feet, or legs get puffy (swell).  You get exposed to German measles and have never had them.  You get exposed to fifth disease or chickenpox.  You have belly (abdominal) pain.  You have a bad headache that will not go away.  You have watery poop (diarrhea), pain when you pee (urinate), or have shortness of breath.  You start to have problems seeing (blurry or double vision).  You fall, are in a car accident, or have any kind of trauma.  There is mental or physical violence at home.  You have any concerns or worries during your pregnancy. MAKE SURE YOU:   Understand these instructions.  Will watch your condition.  Will get help  right away if you are not doing well or get worse. Document Released: 12/02/2009 Document Revised: 11/30/2011 Document Reviewed: 12/02/2009 [employer] Patient Information 2014 [employer].  

## 2013-03-21 ENCOUNTER — Inpatient Hospital Stay (HOSPITAL_COMMUNITY)
Admission: AD | Admit: 2013-03-21 | Discharge: 2013-03-22 | Disposition: A | Discharge status: Home / Self Care | Payer: Medicaid Other | Source: Ambulatory Visit | Attending: Family Medicine | Admitting: Family Medicine

## 2013-03-21 ENCOUNTER — Encounter (HOSPITAL_COMMUNITY): Payer: Self-pay

## 2013-03-21 DIAGNOSIS — B9689 Other specified bacterial agents as the cause of diseases classified elsewhere: Secondary | ICD-10-CM

## 2013-03-21 DIAGNOSIS — N390 Urinary tract infection, site not specified: Secondary | ICD-10-CM

## 2013-03-21 DIAGNOSIS — R109 Unspecified abdominal pain: Secondary | ICD-10-CM | POA: Insufficient documentation

## 2013-03-21 DIAGNOSIS — N949 Unspecified condition associated with female genital organs and menstrual cycle: Secondary | ICD-10-CM

## 2013-03-21 DIAGNOSIS — R51 Headache: Secondary | ICD-10-CM

## 2013-03-21 DIAGNOSIS — O2390 Unspecified genitourinary tract infection in pregnancy, unspecified trimester: Secondary | ICD-10-CM | POA: Insufficient documentation

## 2013-03-21 DIAGNOSIS — A499 Bacterial infection, unspecified: Secondary | ICD-10-CM | POA: Insufficient documentation

## 2013-03-21 DIAGNOSIS — N760 Acute vaginitis: Secondary | ICD-10-CM | POA: Insufficient documentation

## 2013-03-21 HISTORY — DX: Headache: R51

## 2013-03-21 MED ORDER — SODIUM CHLORIDE 0.9 % IV SOLN
INTRAVENOUS | Status: DC
  Administered 2013-03-22: 01:00:00 via INTRAVENOUS

## 2013-03-21 MED ORDER — CYCLOBENZAPRINE HCL 10 MG PO TABS
10.0000 mg | ORAL_TABLET | Freq: Once | ORAL | Status: AC
  Administered 2013-03-22: 10 mg via ORAL
  Filled 2013-03-21: qty 1

## 2013-03-21 MED ORDER — DIPHENHYDRAMINE HCL 50 MG/ML IJ SOLN
25.0000 mg | Freq: Once | INTRAMUSCULAR | Status: AC
  Administered 2013-03-22: 25 mg via INTRAVENOUS
  Filled 2013-03-21: qty 1

## 2013-03-21 MED ORDER — METOCLOPRAMIDE HCL 5 MG/ML IJ SOLN
10.0000 mg | Freq: Once | INTRAMUSCULAR | Status: AC
  Administered 2013-03-22: 10 mg via INTRAVENOUS
  Filled 2013-03-21: qty 2

## 2013-03-21 MED ORDER — DEXAMETHASONE SODIUM PHOSPHATE 10 MG/ML IJ SOLN
10.0000 mg | Freq: Once | INTRAMUSCULAR | Status: AC
  Administered 2013-03-22: 10 mg via INTRAVENOUS
  Filled 2013-03-21: qty 1

## 2013-03-21 NOTE — MAU Provider Note (Signed)
History     CSN: [id number]  Arrival date and time: 03/21/13 2317   First Provider Initiated Contact with Patient 03/21/13 2341      Chief Complaint  Patient presents with  . Abdominal Pain   HPI Melissa Washington is a 27 y.o. G2P1001 at [redacted]w[redacted]d who presents to MAU today with complaint of severe headache x 1 hour and abdominal pain. She rates her headache pain at 9/10 now. She has a history of headaches, but not this severe. The patient states that she started having lower abdominal pain that radiates to her left mid-abdomen today. The pain is sharp and comes and goes. It is worse with ambulation and change of positions. She denies vaginal bleeding. She has had some discharge but has not noted a color. She states that she may have had a fever earlier today, but did not take her temperature. She denies UTI symptoms or hematuria.    OB History   Grav Para Term Preterm Abortions TAB SAB Ect Mult Living   2 1 1  0 0 0 0 0 0 1      Past Medical History  Diagnosis Date  . Diabetes mellitus   . Hypertension   . Nephrolithiasis   . 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 Hepatic steatosis 01/17/2012  . Cholelithiasis 01/17/2012  . [id number](478.2)     Past Surgical History  Procedure Laterality Date  . Portacath placement      [facility]  . Cholecystectomy  01/31/2012    Procedure: LAPAROSCOPIC CHOLECYSTECTOMY WITH INTRAOPERATIVE CHOLANGIOGRAM;  Surgeon: Clovis Pu. Cornett, MD;  Location: [facility] OR;  Service: General;  Laterality: N/A;    Family History  Problem Relation Age of Onset  . Kidney disease Mother   . Hypertension Mother   . Epilepsy Mother   . Sleep apnea Mother   . Kidney disease Maternal Grandmother   . Diabetes Maternal Grandmother   . Diabetes Father     History  Substance Use Topics  . Smoking status: Never Smoker   . Smokeless tobacco: Not on file  . Alcohol Use: No    Allergies:  Allergies  Allergen Reactions  . Ultram (Tramadol Hcl) Nausea  And Vomiting    Prescriptions prior to admission  Medication Sig Dispense Refill  . ACCU-CHEK FASTCLIX LANCETS MISC Inject 1 Device into the skin 4 (four) times daily.  102 each  12  . glucose blood (ACCU-CHEK SMARTVIEW) test strip 1 each by Other route 4 (four) times daily. Check blood sugar four times daily.  100 each  12  . insulin aspart (NOVOLOG) 100 UNIT/ML injection Inject 20 Units into the skin 3 (three) times daily with meals.  10 mL  12  . insulin NPH (HUMULIN N) 100 UNIT/ML injection Inject 17 Units into the skin at bedtime.  1 vial  12  . insulin NPH (HUMULIN N,NOVOLIN N) 100 UNIT/ML injection Inject 15 Units into the skin 2 (two) times daily.  10 mL  3  . Insulin Syringe-Needle U-100 26G X 1/2" 1 ML MISC 35 Units by Does not apply route AC breakfast.  100 each  6  . labetalol (NORMODYNE) 100 MG tablet Take 1 tablet (100 mg total) by mouth 2 (two) times daily.  60 tablet  2  . Prenatal Vit-Fe Fumarate-FA (MULTIVITAMIN-PRENATAL) 27-0.8 MG TABS Take 1 tablet by mouth daily at 12 noon.  30 each  3    Review of Systems  Constitutional: Negative for fever, chills  and malaise/fatigue.  Gastrointestinal: Positive for abdominal pain. Negative for nausea, vomiting, diarrhea and constipation.  Genitourinary: Negative for dysuria, urgency and frequency.       Neg - vaginal bleeding + discharge  Neurological: Positive for dizziness. Negative for loss of consciousness.   Physical Exam   Blood pressure 121/74, pulse 98, temperature 98.3 F (36.8 C), temperature source Oral, resp. rate 20, last menstrual period 12/26/2012, SpO2 99.00%.  Physical Exam  Constitutional: She is oriented to person, place, and time. She appears well-developed and well-nourished. No distress.  HENT:  Head: Normocephalic and atraumatic.  Cardiovascular: Normal rate, regular rhythm and normal heart sounds.   Respiratory: Effort normal and breath sounds normal. No respiratory distress.  GI: Soft. Bowel sounds  are normal. She exhibits no distension. There is tenderness (mild tenderness to palpation of the lower abdomen bilaterally and left mid abdomen at the level of the umbilicus). There is no rebound.  Genitourinary: Uterus is enlarged (appropriate for GA, exam limited by maternal body habitus). Uterus is not tender. Cervix exhibits no motion tenderness, no discharge and no friability. No bleeding around the vagina. Vaginal discharge (small amount of thin white discharge noted) found.  Neurological: She is alert and oriented to person, place, and time.  Skin: Skin is warm and dry. No erythema.  Psychiatric: She has a normal mood and affect.   Results for orders placed during the hospital encounter of 03/21/13 (from the past 24 hour(s))  URINALYSIS, ROUTINE W REFLEX MICROSCOPIC     Status: Abnormal   Collection Time    03/21/13 11:40 PM      Result Value Range   Color, Urine YELLOW  YELLOW   APPearance CLEAR  CLEAR   Specific Gravity, Urine 1.020  1.005 - 1.030   pH 6.0  5.0 - 8.0   Glucose, UA NEGATIVE  NEGATIVE mg/dL   Hgb urine dipstick NEGATIVE  NEGATIVE   Bilirubin Urine NEGATIVE  NEGATIVE   Ketones, ur NEGATIVE  NEGATIVE mg/dL   Protein, ur NEGATIVE  NEGATIVE mg/dL   Urobilinogen, UA 0.2  0.0 - 1.0 mg/dL   Nitrite POSITIVE (*) NEGATIVE   Leukocytes, UA TRACE (*) NEGATIVE  URINE MICROSCOPIC-ADD ON     Status: Abnormal   Collection Time    03/21/13 11:40 PM      Result Value Range   Squamous Epithelial / LPF FEW (*) RARE   WBC, UA 3-6  <3 WBC/hpf   RBC / HPF 3-6  <3 RBC/hpf   Bacteria, UA MANY (*) RARE  WET PREP, GENITAL     Status: Abnormal   Collection Time    03/21/13 11:55 PM      Result Value Range   Yeast Wet Prep HPF POC NONE SEEN  NONE SEEN   Trich, Wet Prep NONE SEEN  NONE SEEN   Clue Cells Wet Prep HPF POC FEW (*) NONE SEEN   WBC, Wet Prep HPF POC MODERATE (*) NONE SEEN     MAU Course  Procedures None  MDM +FHTs UA, Wet prep, GC/Chlamydia today 1 L NS with  benadryl, reglan and decadron for headache Flexeril for abd pain Patient reports resolution of symptoms after above intervention.   Assessment and Plan  A: Round ligament pain Headache Bacterial vaginosis UTI  P: Discharge home Rx for Flexeril, Flagyl and Keflex sent to patient's pharmacy Patient encouraged to increased PO hydration. Tylenol PRN pain.  Patient advised to keep follow-up with [facility] clinic as scheduled or sooner if symptoms worsen  Patient may return to MAU as needed or if her condition were to change or worsen  Freddi Starr, PA-C  03/22/2013, 2:21 AM

## 2013-03-21 NOTE — MAU Note (Signed)
Pt states that about an hour ago she started to have some sharp lower abdominal pain that has now radiated to her left side. Denies vaginal bleeding.

## 2013-03-22 LAB — URINALYSIS, ROUTINE W REFLEX MICROSCOPIC
Bilirubin Urine: NEGATIVE
Hgb urine dipstick: NEGATIVE
Ketones, ur: NEGATIVE mg/dL
Nitrite: POSITIVE — AB
Urobilinogen, UA: 0.2 mg/dL (ref 0.0–1.0)

## 2013-03-22 LAB — URINE MICROSCOPIC-ADD ON

## 2013-03-22 LAB — GC/CHLAMYDIA PROBE AMP
CT Probe RNA: NEGATIVE
GC Probe RNA: NEGATIVE

## 2013-03-22 MED ORDER — CEPHALEXIN 500 MG PO CAPS: 500.0000 mg | ORAL_CAPSULE | Freq: Four times a day (QID) | ORAL | Status: DC

## 2013-03-22 MED ORDER — CYCLOBENZAPRINE HCL 10 MG PO TABS: 10.0000 mg | ORAL_TABLET | Freq: Two times a day (BID) | ORAL | Status: DC | PRN

## 2013-03-22 MED ORDER — METRONIDAZOLE 500 MG PO TABS: 500.0000 mg | ORAL_TABLET | Freq: Two times a day (BID) | ORAL | Status: DC

## 2013-03-22 NOTE — MAU Provider Note (Signed)
Chart reviewed and agree with management and plan.  

## 2013-03-23 LAB — URINE CULTURE: Colony Count: >=100000

## 2013-03-28 ENCOUNTER — Ambulatory Visit (HOSPITAL_COMMUNITY)
Admission: RE | Admit: 2013-03-28 | Discharge: 2013-03-28 | Disposition: A | Discharge status: Home / Self Care | Payer: Medicaid Other | Source: Ambulatory Visit | Attending: Obstetrics & Gynecology | Admitting: Obstetrics & Gynecology

## 2013-03-28 ENCOUNTER — Other Ambulatory Visit (HOSPITAL_COMMUNITY): Payer: Self-pay | Admitting: Maternal and Fetal Medicine

## 2013-03-28 VITALS — BP 125/82 | HR 100 | Wt 287.0 lb

## 2013-03-28 DIAGNOSIS — Z1389 Encounter for screening for other disorder: Secondary | ICD-10-CM | POA: Insufficient documentation

## 2013-03-28 DIAGNOSIS — O358XX0 Maternal care for other (suspected) fetal abnormality and damage, not applicable or unspecified: Secondary | ICD-10-CM | POA: Insufficient documentation

## 2013-03-28 DIAGNOSIS — E1165 Type 2 diabetes mellitus with hyperglycemia: Secondary | ICD-10-CM

## 2013-03-28 DIAGNOSIS — O99212 Obesity complicating pregnancy, second trimester: Secondary | ICD-10-CM

## 2013-03-28 DIAGNOSIS — I10 Essential (primary) hypertension: Secondary | ICD-10-CM

## 2013-03-28 DIAGNOSIS — O24919 Unspecified diabetes mellitus in pregnancy, unspecified trimester: Secondary | ICD-10-CM | POA: Insufficient documentation

## 2013-03-28 DIAGNOSIS — E669 Obesity, unspecified: Secondary | ICD-10-CM | POA: Insufficient documentation

## 2013-03-28 DIAGNOSIS — O10019 Pre-existing essential hypertension complicating pregnancy, unspecified trimester: Secondary | ICD-10-CM | POA: Insufficient documentation

## 2013-03-28 DIAGNOSIS — O24912 Unspecified diabetes mellitus in pregnancy, second trimester: Secondary | ICD-10-CM

## 2013-03-28 DIAGNOSIS — O10012 Pre-existing essential hypertension complicating pregnancy, second trimester: Secondary | ICD-10-CM

## 2013-03-28 DIAGNOSIS — Z363 Encounter for antenatal screening for malformations: Secondary | ICD-10-CM | POA: Insufficient documentation

## 2013-04-03 ENCOUNTER — Ambulatory Visit (INDEPENDENT_AMBULATORY_CARE_PROVIDER_SITE_OTHER): Payer: Medicaid Other | Admitting: Obstetrics & Gynecology

## 2013-04-03 VITALS — BP 120/76 | Wt 287.6 lb

## 2013-04-03 DIAGNOSIS — O10011 Pre-existing essential hypertension complicating pregnancy, first trimester: Secondary | ICD-10-CM

## 2013-04-03 DIAGNOSIS — L0390 Cellulitis, unspecified: Secondary | ICD-10-CM

## 2013-04-03 DIAGNOSIS — C9201 Acute myeloblastic leukemia, in remission: Secondary | ICD-10-CM

## 2013-04-03 DIAGNOSIS — O10012 Pre-existing essential hypertension complicating pregnancy, second trimester: Secondary | ICD-10-CM

## 2013-04-03 DIAGNOSIS — I10 Essential (primary) hypertension: Secondary | ICD-10-CM

## 2013-04-03 DIAGNOSIS — O9989 Other specified diseases and conditions complicating pregnancy, childbirth and the puerperium: Secondary | ICD-10-CM

## 2013-04-03 DIAGNOSIS — C9241 Acute promyelocytic leukemia, in remission: Secondary | ICD-10-CM

## 2013-04-03 DIAGNOSIS — O99211 Obesity complicating pregnancy, first trimester: Secondary | ICD-10-CM

## 2013-04-03 DIAGNOSIS — O99214 Obesity complicating childbirth: Secondary | ICD-10-CM

## 2013-04-03 DIAGNOSIS — O24912 Unspecified diabetes mellitus in pregnancy, second trimester: Secondary | ICD-10-CM

## 2013-04-03 DIAGNOSIS — E1165 Type 2 diabetes mellitus with hyperglycemia: Secondary | ICD-10-CM

## 2013-04-03 DIAGNOSIS — O99212 Obesity complicating pregnancy, second trimester: Secondary | ICD-10-CM

## 2013-04-03 DIAGNOSIS — N898 Other specified noninflammatory disorders of vagina: Secondary | ICD-10-CM

## 2013-04-03 DIAGNOSIS — IMO0002 Reserved for concepts with insufficient information to code with codable children: Secondary | ICD-10-CM

## 2013-04-03 DIAGNOSIS — O0990 Supervision of high risk pregnancy, unspecified, unspecified trimester: Secondary | ICD-10-CM

## 2013-04-03 DIAGNOSIS — O24919 Unspecified diabetes mellitus in pregnancy, unspecified trimester: Secondary | ICD-10-CM

## 2013-04-03 DIAGNOSIS — O26892 Other specified pregnancy related conditions, second trimester: Secondary | ICD-10-CM

## 2013-04-03 DIAGNOSIS — O24911 Unspecified diabetes mellitus in pregnancy, first trimester: Secondary | ICD-10-CM

## 2013-04-03 DIAGNOSIS — O10019 Pre-existing essential hypertension complicating pregnancy, unspecified trimester: Secondary | ICD-10-CM

## 2013-04-03 LAB — POCT URINALYSIS DIP (DEVICE)
Glucose, UA: NEGATIVE mg/dL
Ketones, ur: NEGATIVE mg/dL
Protein, ur: NEGATIVE mg/dL
Specific Gravity, Urine: 1.015 (ref 1.005–1.030)
Urobilinogen, UA: 0.2 mg/dL (ref 0.0–1.0)

## 2013-04-03 LAB — US OB LIMITED

## 2013-04-03 LAB — GLUCOSE, CAPILLARY: Glucose-Capillary: 103 mg/dL — ABNORMAL HIGH (ref 70–99)

## 2013-04-03 MED ORDER — SULFAMETHOXAZOLE-TRIMETHOPRIM 800-160 MG PO TABS: 1.0000 | ORAL_TABLET | Freq: Two times a day (BID) | ORAL | Status: DC

## 2013-04-03 MED ORDER — INSULIN ASPART 100 UNIT/ML ~~LOC~~ SOLN: 20.0000 [IU] | Freq: Three times a day (TID) | SUBCUTANEOUS | Status: DC

## 2013-04-03 MED ORDER — FLUCONAZOLE 150 MG PO TABS: 150.0000 mg | ORAL_TABLET | Freq: Once | ORAL | Status: DC

## 2013-04-03 NOTE — Progress Notes (Signed)
Informal [place] for FHR - 146 bpm per PW doppler.  Dr. Macon Large notified of results.

## 2013-04-03 NOTE — Progress Notes (Signed)
Bug bite a week ago, still has redness and drainage.  Has blanching erythema, Bactrim prescribed.  Was on Keflex last week for UTI, did not help with bug bite.  Also has white, itchy vaginal discharge, likely yeast, wet prep sent.  Diflucan presumptively prescribed.  Did not bring log book, random CBG 103, advised to bring it next week.  Inadequate anatomy scan, follow up scheduled at [facility]. No other complaints or concerns.  Unable to hear FHR with doppler, was 146 bpm on bedside ultrasound. Routine obstetric precautions reviewed.

## 2013-04-03 NOTE — Progress Notes (Signed)
Pulse: 89 Patient reports itchy white discharge. Has a bug bite on her leg that is having drainage. Area is red and warm.

## 2013-04-04 ENCOUNTER — Encounter: Payer: Self-pay | Admitting: Obstetrics & Gynecology

## 2013-04-04 LAB — WET PREP, GENITAL: Trich, Wet Prep: NONE SEEN

## 2013-04-10 ENCOUNTER — Encounter: Payer: Medicaid Other | Admitting: Obstetrics & Gynecology

## 2013-04-18 ENCOUNTER — Ambulatory Visit (HOSPITAL_COMMUNITY)
Admission: RE | Admit: 2013-04-18 | Discharge: 2013-04-18 | Disposition: A | Discharge status: Home / Self Care | Payer: Medicaid Other | Source: Ambulatory Visit | Attending: Obstetrics & Gynecology | Admitting: Obstetrics & Gynecology

## 2013-04-18 DIAGNOSIS — O24919 Unspecified diabetes mellitus in pregnancy, unspecified trimester: Secondary | ICD-10-CM | POA: Insufficient documentation

## 2013-04-18 DIAGNOSIS — O24912 Unspecified diabetes mellitus in pregnancy, second trimester: Secondary | ICD-10-CM

## 2013-04-18 DIAGNOSIS — O99210 Obesity complicating pregnancy, unspecified trimester: Secondary | ICD-10-CM | POA: Insufficient documentation

## 2013-04-18 DIAGNOSIS — Z363 Encounter for antenatal screening for malformations: Secondary | ICD-10-CM | POA: Insufficient documentation

## 2013-04-18 DIAGNOSIS — O358XX0 Maternal care for other (suspected) fetal abnormality and damage, not applicable or unspecified: Secondary | ICD-10-CM | POA: Insufficient documentation

## 2013-04-18 DIAGNOSIS — O99212 Obesity complicating pregnancy, second trimester: Secondary | ICD-10-CM

## 2013-04-18 DIAGNOSIS — O10012 Pre-existing essential hypertension complicating pregnancy, second trimester: Secondary | ICD-10-CM

## 2013-04-18 DIAGNOSIS — O10019 Pre-existing essential hypertension complicating pregnancy, unspecified trimester: Secondary | ICD-10-CM | POA: Insufficient documentation

## 2013-04-18 DIAGNOSIS — Z1389 Encounter for screening for other disorder: Secondary | ICD-10-CM | POA: Insufficient documentation

## 2013-04-18 DIAGNOSIS — E669 Obesity, unspecified: Secondary | ICD-10-CM | POA: Insufficient documentation

## 2013-04-24 ENCOUNTER — Ambulatory Visit (INDEPENDENT_AMBULATORY_CARE_PROVIDER_SITE_OTHER): Payer: Medicaid Other | Admitting: Obstetrics & Gynecology

## 2013-04-24 ENCOUNTER — Encounter: Payer: Self-pay | Admitting: Obstetrics & Gynecology

## 2013-04-24 VITALS — BP 119/80 | Wt 290.8 lb

## 2013-04-24 DIAGNOSIS — O24919 Unspecified diabetes mellitus in pregnancy, unspecified trimester: Secondary | ICD-10-CM

## 2013-04-24 DIAGNOSIS — E1165 Type 2 diabetes mellitus with hyperglycemia: Secondary | ICD-10-CM

## 2013-04-24 LAB — POCT URINALYSIS DIP (DEVICE)
Bilirubin Urine: NEGATIVE
Glucose, UA: NEGATIVE mg/dL
Ketones, ur: 15 mg/dL — AB
Protein, ur: NEGATIVE mg/dL

## 2013-04-24 MED ORDER — TERCONAZOLE 0.4 % VA CREA: 1.0000 | TOPICAL_CREAM | Freq: Every day | VAGINAL | Status: DC

## 2013-04-24 NOTE — Progress Notes (Signed)
Left lower quad pain occasional c/w round ligament.  FBS 75-97,, pp 103-128  Vaginal d/c is c/w yeast, Tx with Terazol 7 days

## 2013-04-24 NOTE — Progress Notes (Signed)
Pulse: 91

## 2013-04-25 ENCOUNTER — Inpatient Hospital Stay (HOSPITAL_COMMUNITY)
Admission: AD | Admit: 2013-04-25 | Discharge: 2013-05-01 | DRG: 781 | Disposition: A | Discharge status: Home / Self Care | Payer: Medicaid Other | Source: Ambulatory Visit | Attending: Obstetrics and Gynecology | Admitting: Obstetrics and Gynecology

## 2013-04-25 ENCOUNTER — Encounter (HOSPITAL_COMMUNITY): Payer: Self-pay | Admitting: *Deleted

## 2013-04-25 ENCOUNTER — Inpatient Hospital Stay (HOSPITAL_COMMUNITY): Payer: Medicaid Other

## 2013-04-25 ENCOUNTER — Observation Stay (HOSPITAL_COMMUNITY): Payer: Medicaid Other

## 2013-04-25 DIAGNOSIS — O98819 Other maternal infectious and parasitic diseases complicating pregnancy, unspecified trimester: Principal | ICD-10-CM | POA: Diagnosis present

## 2013-04-25 DIAGNOSIS — R109 Unspecified abdominal pain: Secondary | ICD-10-CM | POA: Diagnosis present

## 2013-04-25 DIAGNOSIS — Z794 Long term (current) use of insulin: Secondary | ICD-10-CM

## 2013-04-25 DIAGNOSIS — O24919 Unspecified diabetes mellitus in pregnancy, unspecified trimester: Secondary | ICD-10-CM | POA: Diagnosis present

## 2013-04-25 DIAGNOSIS — O10012 Pre-existing essential hypertension complicating pregnancy, second trimester: Secondary | ICD-10-CM

## 2013-04-25 DIAGNOSIS — C9241 Acute promyelocytic leukemia, in remission: Secondary | ICD-10-CM | POA: Diagnosis present

## 2013-04-25 DIAGNOSIS — O10019 Pre-existing essential hypertension complicating pregnancy, unspecified trimester: Secondary | ICD-10-CM | POA: Diagnosis present

## 2013-04-25 DIAGNOSIS — E1169 Type 2 diabetes mellitus with other specified complication: Secondary | ICD-10-CM | POA: Diagnosis present

## 2013-04-25 DIAGNOSIS — C9101 Acute lymphoblastic leukemia, in remission: Secondary | ICD-10-CM | POA: Diagnosis present

## 2013-04-25 DIAGNOSIS — A4151 Sepsis due to Escherichia coli [E. coli]: Secondary | ICD-10-CM | POA: Diagnosis present

## 2013-04-25 DIAGNOSIS — IMO0002 Reserved for concepts with insufficient information to code with codable children: Secondary | ICD-10-CM

## 2013-04-25 DIAGNOSIS — R0602 Shortness of breath: Secondary | ICD-10-CM | POA: Diagnosis present

## 2013-04-25 DIAGNOSIS — R509 Fever, unspecified: Secondary | ICD-10-CM

## 2013-04-25 DIAGNOSIS — E1165 Type 2 diabetes mellitus with hyperglycemia: Secondary | ICD-10-CM | POA: Diagnosis present

## 2013-04-25 DIAGNOSIS — O0990 Supervision of high risk pregnancy, unspecified, unspecified trimester: Secondary | ICD-10-CM

## 2013-04-25 DIAGNOSIS — A419 Sepsis, unspecified organism: Secondary | ICD-10-CM | POA: Diagnosis present

## 2013-04-25 DIAGNOSIS — R7881 Bacteremia: Secondary | ICD-10-CM | POA: Diagnosis present

## 2013-04-25 DIAGNOSIS — B9620 Unspecified Escherichia coli [E. coli] as the cause of diseases classified elsewhere: Secondary | ICD-10-CM | POA: Diagnosis present

## 2013-04-25 DIAGNOSIS — Y849 Medical procedure, unspecified as the cause of abnormal reaction of the patient, or of later complication, without mention of misadventure at the time of the procedure: Secondary | ICD-10-CM | POA: Diagnosis present

## 2013-04-25 DIAGNOSIS — O99210 Obesity complicating pregnancy, unspecified trimester: Secondary | ICD-10-CM | POA: Diagnosis present

## 2013-04-25 DIAGNOSIS — T80218A Other infection due to central venous catheter, initial encounter: Secondary | ICD-10-CM | POA: Diagnosis present

## 2013-04-25 DIAGNOSIS — I10 Essential (primary) hypertension: Secondary | ICD-10-CM | POA: Diagnosis present

## 2013-04-25 HISTORY — DX: Bacteremia: R78.81

## 2013-04-25 HISTORY — DX: Acute monoblastic/monocytic leukemia, in remission: C93.01

## 2013-04-25 HISTORY — DX: Infection and inflammatory reaction due to other cardiac and vascular devices, implants and grafts, initial encounter: T82.7XXA

## 2013-04-25 LAB — WET PREP, GENITAL
Clue Cells Wet Prep HPF POC: NONE SEEN
Trich, Wet Prep: NONE SEEN

## 2013-04-25 LAB — URINALYSIS, ROUTINE W REFLEX MICROSCOPIC
Bilirubin Urine: NEGATIVE
Ketones, ur: NEGATIVE mg/dL
Nitrite: NEGATIVE
Urobilinogen, UA: 0.2 mg/dL (ref 0.0–1.0)

## 2013-04-25 LAB — COMPREHENSIVE METABOLIC PANEL
AST: 15 U/L (ref 0–37)
Albumin: 2.8 g/dL — ABNORMAL LOW (ref 3.5–5.2)
Calcium: 9.1 mg/dL (ref 8.4–10.5)
Chloride: 105 mEq/L (ref 96–112)
Creatinine, Ser: 0.6 mg/dL (ref 0.50–1.10)
Sodium: 137 mEq/L (ref 135–145)
Total Bilirubin: 1.1 mg/dL (ref 0.3–1.2)

## 2013-04-25 LAB — URINE MICROSCOPIC-ADD ON

## 2013-04-25 LAB — CBC WITH DIFFERENTIAL/PLATELET
Basophils Absolute: 0 10*3/uL (ref 0.0–0.1)
Basophils Relative: 0 % (ref 0–1)
HCT: 27.8 % — ABNORMAL LOW (ref 36.0–46.0)
MCHC: 35.3 g/dL (ref 30.0–36.0)
Monocytes Absolute: 1 10*3/uL (ref 0.1–1.0)
Neutro Abs: 9.1 10*3/uL — ABNORMAL HIGH (ref 1.7–7.7)
Platelets: 219 10*3/uL (ref 150–400)
RDW: 13.8 % (ref 11.5–15.5)

## 2013-04-25 LAB — GLUCOSE, CAPILLARY: Glucose-Capillary: 153 mg/dL — ABNORMAL HIGH (ref 70–99)

## 2013-04-25 MED ORDER — CLOTRIMAZOLE 1 % VA CREA
1.0000 | TOPICAL_CREAM | Freq: Every day | VAGINAL | Status: DC
  Administered 2013-04-25 – 2013-04-30 (×6): 1 via VAGINAL
  Filled 2013-04-25: qty 45

## 2013-04-25 MED ORDER — INSULIN ASPART 100 UNIT/ML ~~LOC~~ SOLN
0.0000 [IU] | Freq: Three times a day (TID) | SUBCUTANEOUS | Status: DC
  Administered 2013-04-26 – 2013-04-27 (×2): 2 [IU] via SUBCUTANEOUS
  Administered 2013-04-29: 0 [IU] via SUBCUTANEOUS
  Administered 2013-04-30: 1 [IU] via SUBCUTANEOUS

## 2013-04-25 MED ORDER — LACTATED RINGERS IV SOLN: INTRAVENOUS | Status: DC

## 2013-04-25 MED ORDER — PIPERACILLIN-TAZOBACTAM 3.375 G IVPB: 3.3750 g | Freq: Four times a day (QID) | INTRAVENOUS | Status: DC

## 2013-04-25 MED ORDER — INSULIN ASPART 100 UNIT/ML ~~LOC~~ SOLN
10.0000 [IU] | Freq: Three times a day (TID) | SUBCUTANEOUS | Status: DC
  Administered 2013-04-25 – 2013-04-29 (×10): 10 [IU] via SUBCUTANEOUS
  Administered 2013-04-29: 21:00:00 via SUBCUTANEOUS
  Administered 2013-04-29 – 2013-05-01 (×5): 10 [IU] via SUBCUTANEOUS

## 2013-04-25 MED ORDER — SODIUM CHLORIDE 0.9 % IJ SOLN
INTRAMUSCULAR | Status: AC
  Filled 2013-04-25: qty 6

## 2013-04-25 MED ORDER — INSULIN ASPART 100 UNIT/ML ~~LOC~~ SOLN: 10.0000 [IU] | Freq: Once | SUBCUTANEOUS | Status: DC

## 2013-04-25 MED ORDER — ACETAMINOPHEN 325 MG PO TABS
650.0000 mg | ORAL_TABLET | ORAL | Status: DC | PRN
  Administered 2013-04-25: 650 mg via ORAL
  Filled 2013-04-25: qty 2

## 2013-04-25 MED ORDER — CEFTRIAXONE SODIUM 1 G IJ SOLR
1.0000 g | Freq: Two times a day (BID) | INTRAMUSCULAR | Status: DC
  Filled 2013-04-25: qty 10

## 2013-04-25 MED ORDER — SODIUM CHLORIDE 0.9 % IV SOLN
INTRAVENOUS | Status: DC
  Administered 2013-04-25 – 2013-04-30 (×9): via INTRAVENOUS

## 2013-04-25 MED ORDER — VANCOMYCIN HCL 10 G IV SOLR
1500.0000 mg | Freq: Once | INTRAVENOUS | Status: DC
  Filled 2013-04-25: qty 1500

## 2013-04-25 MED ORDER — ZOLPIDEM TARTRATE 5 MG PO TABS
5.0000 mg | ORAL_TABLET | Freq: Every evening | ORAL | Status: DC | PRN
  Filled 2013-04-25: qty 1

## 2013-04-25 MED ORDER — CALCIUM CARBONATE ANTACID 500 MG PO CHEW: 2.0000 | CHEWABLE_TABLET | ORAL | Status: DC | PRN

## 2013-04-25 MED ORDER — INSULIN NPH (HUMAN) (ISOPHANE) 100 UNIT/ML ~~LOC~~ SUSP
10.0000 [IU] | Freq: Once | SUBCUTANEOUS | Status: DC
  Filled 2013-04-25: qty 10

## 2013-04-25 MED ORDER — TERCONAZOLE 0.4 % VA CREA: 1.0000 | TOPICAL_CREAM | Freq: Every day | VAGINAL | Status: DC

## 2013-04-25 MED ORDER — PIPERACILLIN-TAZOBACTAM 3.375 G IVPB
3.3750 g | Freq: Once | INTRAVENOUS | Status: DC
  Filled 2013-04-25: qty 50

## 2013-04-25 MED ORDER — OXYCODONE-ACETAMINOPHEN 5-325 MG PO TABS: 1.0000 | ORAL_TABLET | Freq: Four times a day (QID) | ORAL | Status: DC | PRN

## 2013-04-25 MED ORDER — HYDROMORPHONE HCL 2 MG PO TABS
2.0000 mg | ORAL_TABLET | Freq: Once | ORAL | Status: AC
  Administered 2013-04-25: 2 mg via ORAL
  Filled 2013-04-25: qty 1

## 2013-04-25 MED ORDER — LACTATED RINGERS IV SOLN
INTRAVENOUS | Status: DC
  Administered 2013-04-25: 03:00:00 via INTRAVENOUS

## 2013-04-25 MED ORDER — ACETAMINOPHEN 500 MG PO TABS: 1000.0000 mg | ORAL_TABLET | Freq: Three times a day (TID) | ORAL | Status: DC | PRN

## 2013-04-25 MED ORDER — DEXTROSE 5 % IV SOLN
1.0000 g | Freq: Two times a day (BID) | INTRAVENOUS | Status: DC
  Administered 2013-04-25 – 2013-04-26 (×3): 1 g via INTRAVENOUS
  Filled 2013-04-25 (×3): qty 10

## 2013-04-25 MED ORDER — DOCUSATE SODIUM 100 MG PO CAPS
100.0000 mg | ORAL_CAPSULE | Freq: Every day | ORAL | Status: DC
  Administered 2013-04-25 – 2013-04-30 (×7): 100 mg via ORAL
  Filled 2013-04-25 (×8): qty 1

## 2013-04-25 MED ORDER — ACETAMINOPHEN 500 MG PO TABS
1000.0000 mg | ORAL_TABLET | Freq: Once | ORAL | Status: AC
  Administered 2013-04-25: 1000 mg via ORAL
  Filled 2013-04-25: qty 2

## 2013-04-25 MED ORDER — INSULIN NPH (HUMAN) (ISOPHANE) 100 UNIT/ML ~~LOC~~ SUSP
10.0000 [IU] | Freq: Once | SUBCUTANEOUS | Status: AC
  Administered 2013-04-25: 10 [IU] via SUBCUTANEOUS
  Filled 2013-04-25: qty 10

## 2013-04-25 MED ORDER — PRENATAL MULTIVITAMIN CH
1.0000 | ORAL_TABLET | Freq: Every day | ORAL | Status: DC
  Administered 2013-04-26 – 2013-04-30 (×4): 1 via ORAL
  Filled 2013-04-25 (×4): qty 1

## 2013-04-25 NOTE — Progress Notes (Signed)
0300 - accessed existing port-a-cath in right chest wall per protocol with 19g needle on 1st attempt and without difficulty, labs (including 1 set of blood cultures) obtained, line flushed easily, capped, dressing applied. Pt. Tolerated all well, with no c/o.

## 2013-04-25 NOTE — H&P (Signed)
Melissa Washington is a 27 y.o. G2P1001 at [redacted]w[redacted]d presents to triage via ambulance with complaints of severe nausea and vomiting with Rigors. Symptoms started this evening at 9:30. This was unable to keep down any solids or liquids and was having severe abdominal pain with vomiting. EMS was called and reported a fever at presentation, blood glucose 150s, placed on oxygen, an IV was started with fluid and patient was transferred to [facility].   Upon presentation patient had severe chills, diffuse pain, and fever to 101.4. Patient was given Tylenol 1000 mg and labs were drawn for evaluation of sepsis. Chest x-ray was collected as well.   On review of systems, patient endorses shortness of breath but no cough, wheezing, chest pain. Also endorses nausea and vomiting prior to presentation but tolerating by mouth in the emergency room. Patient denies diarrhea or constipation. Patient endorses mild left-sided back pain left-sided abdominal pain. Describes as 4-6/10. Patient reports normal bowel movement yesterday evening at 7:30 PM.   Patient denies weakness or any neurologic symptoms at this time. Patient endorses fetal movement at baseline for 20 weeks. No loss of fluid, no vaginal bleeding and denies contractions.     History OB History   Grav Para Term Preterm Abortions TAB SAB Ect Mult Living   2 1 1  0 0 0 0 0 0 1     Past Medical History  Diagnosis Date  . Diabetes mellitus   . Hypertension   . Nephrolithiasis   . 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 Hepatic steatosis 01/17/2012  . Cholelithiasis 01/17/2012  . Headache(784.0)   . Leukemia, acute monocytic, in remission    Past Surgical History  Procedure Laterality Date  . Portacath placement      [facility]  . Cholecystectomy  01/31/2012    Procedure: LAPAROSCOPIC CHOLECYSTECTOMY WITH INTRAOPERATIVE CHOLANGIOGRAM;  Surgeon: Clovis Pu. Cornett, MD;  Location: [facility] OR;  Service: General;  Laterality: N/A;   Family  History: family history includes Diabetes in her father and maternal grandmother; Epilepsy in her mother; Hypertension in her mother; Kidney disease in her maternal grandmother and mother; and Sleep apnea in her mother. Social History:  reports that she has never smoked. She does not have any smokeless tobacco history on file. She reports that she does not drink alcohol or use illicit drugs.   Prenatal Transfer Tool  Maternal Diabetes: Yes:  Diabetes Type:  Pre-pregnancy Genetic Screening: Normal Maternal Ultrasounds/Referrals: Normal Fetal Ultrasounds or other Referrals:  None Maternal Substance Abuse:  No Significant Maternal Medications:  None Significant Maternal Lab Results:  None Other Comments:  hx of leukemia, last tx aug 2013  ROS See above   Blood pressure 116/54, pulse 102, temperature 98.3 F (36.8 C), temperature source Oral, resp. rate 28, height 5\' 2"  (1.575 m), weight 131.543 kg (290 lb), last menstrual period 12/26/2012, SpO2 99.00%.  Filed Vitals:   04/25/13 0115 04/25/13 0214 04/25/13 0336  BP: 107/51 109/53 116/54  Pulse: 129 115 102  Temp: 101.1 F (38.4 C) 101.2 F (38.4 C) 98.3 F (36.8 C)  TempSrc:   Oral  Resp: 28 28 28   Height: 5\' 2"  (1.575 m)    Weight: 131.543 kg (290 lb)    SpO2:  100% 99%    Exam Physical Exam Physical Exam  Nursing note and vitals reviewed.  Constitutional: She is oriented to person, place, and time. She appears well-developed and well-nourished. She appears distressed (moderate distress with fever).  Febrile  on presentation. Tachycardiac on presentation. Normotensive without blood pressure medications.  HENT:  Head: Normocephalic and atraumatic.  Eyes: Right eye exhibits no discharge. Left eye exhibits no discharge. No scleral icterus.  Cardiovascular: Normal rate, regular rhythm, normal heart sounds and intact distal pulses. Exam reveals no gallop and no friction rub.  No murmur heard.  Respiratory: Effort normal and  breath sounds normal. No respiratory distress. She has no wheezes. She has no rales. She exhibits no tenderness.  Reports increased worker breathing. Mildly tachypneic  GI: Soft. Bowel sounds are normal. She exhibits no distension and no mass. There is tenderness (diffusely tender but most significantly and left middle quadrant. No rebound no guarding no obvious masses). There is no rebound and no guarding.  Musculoskeletal: Normal range of motion. She exhibits no edema and no tenderness.  Lymphadenopathy:  She has no cervical adenopathy.  Neurological: She is alert and oriented to person, place, and time.  Skin: Skin is warm. She is not diaphoretic. No erythema.   CBC    Component Value Date/Time   WBC 10.9* 04/25/2013 0300   WBC 9.1 02/06/2013 1128   RBC 3.37* 04/25/2013 0300   RBC 4.17 02/06/2013 1128   RBC 2.50* 11/26/2011 0910   HGB 9.8* 04/25/2013 0300   HGB 12.0 02/06/2013 1128   HCT 27.8* 04/25/2013 0300   HCT 35.7 02/06/2013 1128   PLT 219 04/25/2013 0300   PLT 256 02/06/2013 1128   MCV 82.5 04/25/2013 0300   MCV 85.5 02/06/2013 1128   MCH 29.1 04/25/2013 0300   MCH 28.7 02/06/2013 1128   MCHC 35.3 04/25/2013 0300   MCHC 33.6 02/06/2013 1128   RDW 13.8 04/25/2013 0300   RDW 14.0 02/06/2013 1128   LYMPHSABS 0.8 04/25/2013 0300   LYMPHSABS 2.2 02/06/2013 1128   MONOABS 1.0 04/25/2013 0300   MONOABS 0.6 02/06/2013 1128   EOSABS 0.0 04/25/2013 0300   EOSABS 0.1 02/06/2013 1128   BASOSABS 0.0 04/25/2013 0300   BASOSABS 0.0 02/06/2013 1128    CMP     Component Value Date/Time   NA 137 04/25/2013 0300   NA 137 02/06/2013 1128   K 3.7 04/25/2013 0300   K 4.0 02/06/2013 1128   CL 105 04/25/2013 0300   CL 104 02/06/2013 1128   CO2 19 04/25/2013 0300   CO2 23 02/06/2013 1128   GLUCOSE 66* 04/25/2013 0300   GLUCOSE 120* 02/06/2013 1128   BUN 8 04/25/2013 0300   BUN 7.8 02/06/2013 1128   CREATININE 0.60 04/25/2013 0300   CREATININE 0.7 02/06/2013 1128   CALCIUM 9.1 04/25/2013 0300   CALCIUM 9.1 02/06/2013 1128   PROT 5.8*  04/25/2013 0300   PROT 7.0 02/06/2013 1128   ALBUMIN 2.8* 04/25/2013 0300   ALBUMIN 3.6 02/06/2013 1128   AST 15 04/25/2013 0300   AST 27 02/06/2013 1128   ALT 14 04/25/2013 0300   ALT 33 02/06/2013 1128   ALKPHOS 72 04/25/2013 0300   ALKPHOS 77 02/06/2013 1128   BILITOT 1.1 04/25/2013 0300   BILITOT 0.52 02/06/2013 1128   GFRNONAA >90 04/25/2013 0300   GFRAA >90 04/25/2013 0300   Lactic Acid, Venous 1.3  Stat Portable CXR Findings: Lungs are clear. Heart size is upper normal. No  pneumothorax or pleural fluid. Right IJ approach Port-A-Cath is  noted. No pneumothorax or pleural effusion.  IMPRESSION:  No acute disease.  UA: trace LE, neg nitrate, trace hgb    Prenatal labs: ABO, Rh: B/POS/-- (05/01 1216) Antibody: NEG (05/01 1216)  Rubella: 2.06 (05/01 1216) RPR: NON REAC (05/01 1216)  HBsAg: NEGATIVE (05/01 1216)  HIV: NON REACTIVE (05/01 1216)  GBS:     Assessment/Plan: [name] is a 27 y.o. G2P1001 at [redacted]w[redacted]d presents with non specific febrile illness. Patient received 1000 mg acetaminophen with resolution of fever. Patient's rigors resolved after fever. Patient feeling more comfortable. With returning labs reassuring for less critical etiology. DDx at this time is diverticulitis versus nephrolithiasis vs early pyelo. Unlikely DVT or DKA. Imaging modality may be ultrasound for evaluation of nephrolithiasis.  #FEVER NOS: SIRS initially 3/4. However labs returned reassuring and improvement of tachypnea and fever with APAP. Continue to monitor, labs collected, but no evidence of septic shock on exam lacking evidence of end organ damage or hypotension. Improved with APAP. Pending Blood cultures. Follow up Urine culture. CXR neg. DDx includes pyelo not obvious on PE and UA vs diverticulitis unlikely in this age group, vs acute gastroenteritis viral. - Follow up cx - Tx with rocephin 1g q12 initially while evaluating sources - repeat CXR PRN  #Left sided abdomonial Pain; DDx includes  nephrolithiasis and diverticulitis vs msk from emesis.  - Eval with Renal US for possible hydronephrosis  #SOB: likely related to body habitus, improved signficantly with sleep and APAP. However Wells score 4.5 moderate pretest probability. Will Eval with d-dimer given tachycardic and tachypnic wihtout O2 requirement.  2nd trimester ranges (0.32-1.29 microg/ml)  - f/u d-dimer, if elevated recommend considering further eval for DVT  #DM: Insulin dependent - asymptomatic hypoglycemia on presentation at 66 on CMP (home regiment NPH 15/17 and Novolog 15 qac). Will decrease home dose to: - NPH: 10 BID - Novolog: 10+SSI - qac checks  #HTN: BPs low normal at this time. Will hold labetalol for possible concerning lower bps. Continue to monitor  FEN: NS @75 /hr / Replete PRN / Carb controlled diet  Full Code  Dispo: Admit for monitoring of fever and Abx regimen.  Melissa Washington, Melissa Washington 04/25/2013, 5:11 AM

## 2013-04-25 NOTE — Progress Notes (Signed)
UR completed 

## 2013-04-25 NOTE — Progress Notes (Signed)
Subjective: Patient feels weak, has some sharp left sided abdominal pain. Still feels a little short of breath.  Objective: Filed Vitals:   04/25/13 1400  BP: 117/65  Pulse: 96  Temp: 97.9 F (36.6 C)  Resp: 20   General: NAD Cardiac: borderline tachy, regular rhythm, normal s1/s2 Lungs: CTAB bilaterally, effort normal Abdomen: obese, diffuse cramping pain to palpation throughout all 4 quadrants, sharp pain in LUQ without rebound. Extremities: trace edema Skin: warm and dry  Renal ultrasound: Findings:  Right Kidney: 12.4 cm. Normal size and echotexture. No focal  abnormality. No hydronephrosis.  Left Kidney: 12.2 cm. Normal size and echotexture. No focal  abnormality. No hydronephrosis.  Bladder: Normal appearance without significant postvoid residual.  IMPRESSION:  Unremarkable study.  D-dimer normal at 1.09 (for 2nd trimester expected normal ranges are 0.32-1.29microg/ml)  A/P:  27 y.o. 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 Cyndie Chime at the [facility]). They do not believe that the history of APML 1 year after treatment would be related at this time. Recommend continuing current investigation. No formal consult required at this time.  Tawni Carnes, MD 04/25/2013 15:30

## 2013-04-26 ENCOUNTER — Ambulatory Visit (HOSPITAL_COMMUNITY)
Admit: 2013-04-26 | Discharge: 2013-04-26 | Disposition: A | Discharge status: Home / Self Care | Payer: Medicaid Other | Attending: Obstetrics & Gynecology | Admitting: Obstetrics & Gynecology

## 2013-04-26 DIAGNOSIS — A4151 Sepsis due to Escherichia coli [E. coli]: Secondary | ICD-10-CM

## 2013-04-26 DIAGNOSIS — T80218A Other infection due to central venous catheter, initial encounter: Secondary | ICD-10-CM

## 2013-04-26 DIAGNOSIS — O98819 Other maternal infectious and parasitic diseases complicating pregnancy, unspecified trimester: Secondary | ICD-10-CM

## 2013-04-26 DIAGNOSIS — E1169 Type 2 diabetes mellitus with other specified complication: Secondary | ICD-10-CM

## 2013-04-26 LAB — GLUCOSE, CAPILLARY
Glucose-Capillary: 126 mg/dL — ABNORMAL HIGH (ref 70–99)
Glucose-Capillary: 150 mg/dL — ABNORMAL HIGH (ref 70–99)

## 2013-04-26 LAB — CBC
MCV: 83.6 fL (ref 78.0–100.0)
Platelets: 185 10*3/uL (ref 150–400)
RBC: 2.98 MIL/uL — ABNORMAL LOW (ref 3.87–5.11)
WBC: 7 10*3/uL (ref 4.0–10.5)

## 2013-04-26 MED ORDER — INSULIN NPH (HUMAN) (ISOPHANE) 100 UNIT/ML ~~LOC~~ SUSP
10.0000 [IU] | Freq: Two times a day (BID) | SUBCUTANEOUS | Status: DC
  Administered 2013-04-26 – 2013-05-01 (×10): 10 [IU] via SUBCUTANEOUS

## 2013-04-26 MED ORDER — METRONIDAZOLE IN NACL 5-0.79 MG/ML-% IV SOLN
500.0000 mg | Freq: Three times a day (TID) | INTRAVENOUS | Status: DC
  Administered 2013-04-26: 500 mg via INTRAVENOUS
  Filled 2013-04-26 (×2): qty 100

## 2013-04-26 MED ORDER — PANTOPRAZOLE SODIUM 40 MG PO TBEC
40.0000 mg | DELAYED_RELEASE_TABLET | Freq: Two times a day (BID) | ORAL | Status: DC
  Administered 2013-04-26 – 2013-05-01 (×10): 40 mg via ORAL
  Filled 2013-04-26 (×13): qty 1

## 2013-04-26 MED ORDER — PIPERACILLIN-TAZOBACTAM 3.375 G IVPB
3.3750 g | Freq: Three times a day (TID) | INTRAVENOUS | Status: DC
  Administered 2013-04-26 – 2013-04-27 (×3): 3.375 g via INTRAVENOUS
  Filled 2013-04-26 (×6): qty 50

## 2013-04-26 NOTE — Progress Notes (Signed)
Ur chart review completed.  

## 2013-04-26 NOTE — Progress Notes (Signed)
Patient ID: Melissa Washington, female   DOB: Jun 27, 1986, 27 y.o.   MRN: [id number] FACULTY PRACTICE ANTEPARTUM(COMPREHENSIVE) NOTE  Melissa Washington is a 27 y.o. G2P1001 at [redacted]w[redacted]d  who is admitted for fever and abdominal pain. Length of Stay:  1  Days  Subjective: Epigastric pain. Patient reports the fetal movement as active. Patient reports uterine contraction  activity as none. Patient reports  vaginal bleeding as none. Patient describes fluid per vagina as None.  Vitals:  Blood pressure 102/64, pulse 86, temperature 98.7 F (37.1 C), temperature source Oral, resp. rate 18, height 5\' 2"  (1.575 m), weight 286 lb (129.729 kg), last menstrual period 12/26/2012, SpO2 98.00%. Physical Examination:  General appearance - alert, well appearing, and in no distress Abdomen - soft, obese, very minimal tenderness in epigastric area.  NO rebound, no guarding. Extremities - no edema, redness or tenderness in the calves or thighs  Fetal Monitoring:   Fh q shift  Labs:  Results for orders placed during the hospital encounter of 04/25/13 (from the past 24 hour(s))  GLUCOSE, CAPILLARY   Collection Time    04/25/13  7:58 AM      Result Value Range   Glucose-Capillary 111 (*) 70 - 99 mg/dL  D-DIMER, QUANTITATIVE   Collection Time    04/25/13  9:05 AM      Result Value Range   D-Dimer, Quant 1.09 (*) 0.00 - 0.48 ug/mL-FEU  GLUCOSE, CAPILLARY   Collection Time    04/25/13  2:43 PM      Result Value Range   Glucose-Capillary 119 (*) 70 - 99 mg/dL  GLUCOSE, CAPILLARY   Collection Time    04/25/13  8:54 PM      Result Value Range   Glucose-Capillary 110 (*) 70 - 99 mg/dL   Comment 1 Notify RN    GLUCOSE, CAPILLARY   Collection Time    04/25/13 10:30 PM      Result Value Range   Glucose-Capillary 153 (*) 70 - 99 mg/dL   Comment 1 Notify RN    CBC   Collection Time    04/26/13  5:30 AM      Result Value Range   WBC 7.0  4.0 - 10.5 K/uL   RBC 2.98 (*) 3.87 - 5.11 MIL/uL   Hemoglobin 8.7  (*) 12.0 - 15.0 g/dL   HCT 08.6 (*) 57.8 - 46.9 %   MCV 83.6  78.0 - 100.0 fL   MCH 29.2  26.0 - 34.0 pg   MCHC 34.9  30.0 - 36.0 g/dL   RDW 62.9  52.8 - 41.3 %   Platelets 185  150 - 400 K/uL     Medications:  Scheduled . cefTRIAXone (ROCEPHIN)  IV  1 g Intravenous Q12H  . clotrimazole  1 Applicatorful Vaginal QHS  . docusate sodium  100 mg Oral Daily  . insulin aspart  0-15 Units Subcutaneous TID WC  . insulin aspart  10 Units Subcutaneous TID WC  . prenatal multivitamin  1 tablet Oral Q1200   I have reviewed the patient's current medications.  ASSESSMENT: Patient Active Problem List   Diagnosis Date Noted  . Diabetes mellitus, antepartum 02/20/2013  . Benign essential hypertension antepartum 02/20/2013  . Obesity complicating peripregnancy, antepartum 02/20/2013  . Supervision of high-risk pregnancy 01/19/2013  . 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 Morbid obesity with BMI of 50.0-59.9, adult 01/20/2012  . DM (diabetes mellitus), type 2, uncontrolled 12/30/2006  . HYPERTENSION, BENIGN ESSENTIAL 12/30/2006  PLAN: 27 yo female with fever and postivie blood cultures  1-suspect port to be the source 2-Verbal report from lab was anaerobic GNR 3-ID consult 4-Protonix for epigastric pain  LEGGETT,KELLY H. 04/26/2013,7:39 AM

## 2013-04-26 NOTE — Progress Notes (Signed)
ANTIBIOTIC CONSULT NOTE - INITIAL  Pharmacy Consult for Zosyn Indication: GNR Bacteremia  Allergies  Allergen Reactions  . Ultram (Tramadol Hcl) Nausea And Vomiting    Patient Measurements: Height: 5\' 2"  (157.5 cm) Weight: 286 lb (129.729 kg) IBW/kg (Calculated) : 50.1   Vital Signs: Temp: 98.7 F (37.1 C) (08/06 0501) Temp src: Oral (08/06 0501) BP: 102/64 mmHg (08/06 0501) Pulse Rate: 86 (08/06 0501) Intake/Output from previous day: 08/05 0701 - 08/06 0700 In: 1715 [P.O.:840; I.V.:825; IV Piggyback:50] Out: 1650 [Urine:1650] Intake/Output from this shift:    Labs:  Recent Labs  04/25/13 0300 04/26/13 0530  WBC 10.9* 7.0  HGB 9.8* 8.7*  PLT 219 185  CREATININE 0.60  --    Estimated Creatinine Clearance: 140.2 ml/min (by C-G formula based on Cr of 0.6). No results found for this basename: VANCOTROUGH, VANCOPEAK, VANCORANDOM, GENTTROUGH, GENTPEAK, GENTRANDOM, TOBRATROUGH, TOBRAPEAK, TOBRARND, AMIKACINPEAK, AMIKACINTROU, AMIKACIN,  in the last 72 hours   Microbiology: Recent Results (from the past 720 hour(s))  WET PREP, GENITAL     Status: Abnormal   Collection Time    04/03/13 10:29 AM      Result Value Range Status   Yeast Wet Prep HPF POC TNTC (*) NONE SEEN Final   Trich, Wet Prep NONE SEEN  NONE SEEN Final   Clue Cells Wet Prep HPF POC NONE SEEN  NONE SEEN Final   WBC, Wet Prep HPF POC MOD (*) NONE SEEN Final  WET PREP, GENITAL     Status: Abnormal   Collection Time    04/24/13 11:12 AM      Result Value Range Status   Yeast Wet Prep HPF POC MOD (*) NONE SEEN Final   Trich, Wet Prep NONE SEEN  NONE SEEN Final   Clue Cells Wet Prep HPF POC NONE SEEN  NONE SEEN Final   WBC, Wet Prep HPF POC FEW  NONE SEEN Final  CULTURE, BLOOD (ROUTINE X 2)     Status: None   Collection Time    04/25/13  3:00 AM      Result Value Range Status   Specimen Description BLOOD PORT A CATH   Final   Special Requests BOTTLES DRAWN AEROBIC AND ANAEROBIC   Final   Culture  Setup Time     Final   Value: 04/25/2013 10:21     Performed at [facility]   Culture     Final   Value: GRAM NEGATIVE RODS     Note: Gram Stain Report Called to,Read Back By and Verified With: EMILY CALDWELL ON 04/26/2013 AT 12:21A BY WILEJ     Performed at [facility]   Report Status PENDING   Incomplete  CULTURE, BLOOD (ROUTINE X 2)     Status: None   Collection Time    04/25/13  6:00 AM      Result Value Range Status   Specimen Description BLOOD  LEFT BAC   Final   Special Requests Immunocompromised  10 ML BAA   Final   Culture  Setup Time     Final   Value: 04/25/2013 10:22     Performed at [facility]   Culture     Final   Value: GRAM NEGATIVE RODS     Note: Gram Stain Report Called to,Read Back By and Verified With: EMILY CALDWELL ON 04/26/2013 AT 12:21A BY WILEJ     Performed at [facility]   Report Status PENDING   Incomplete  Medical History: Past Medical History  Diagnosis Date  . Diabetes mellitus   . Hypertension   . Nephrolithiasis   . 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 Hepatic steatosis 01/17/2012  . Cholelithiasis 01/17/2012  . Headache(784.0)   . Leukemia, acute monocytic, in remission     Medications:  Scheduled:  . clotrimazole  1 Applicatorful Vaginal QHS  . docusate sodium  100 mg Oral Daily  . insulin aspart  0-15 Units Subcutaneous TID WC  . insulin aspart  10 Units Subcutaneous TID WC  . metronidazole  500 mg Intravenous Q8H  . pantoprazole  40 mg Oral BID  . piperacillin-tazobactam (ZOSYN)  IV  3.375 g Intravenous Q8H  . prenatal multivitamin  1 tablet Oral Q1200   Assessment: 27 yr old female G2P 1001 at [redacted]w[redacted]d on zosyn and metronidazole IV for GNR bacteremia.  Blood cultures pending.  Crcl~139ml/min.   Goal of Therapy:  Eradication of infection  Plan:  Start Zosyn 3.375gm IV q8hrs (4 hour infusion) F/u culture data, renal function.  Wendie Simmer, PharmD, BCPS Clinical  Pharmacist  Pager: [phone]

## 2013-04-26 NOTE — H&P (Signed)
Melissa Washington is an 27 y.o. female.   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 Diabetes mellitus   . Hypertension   . Nephrolithiasis   . 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 Hepatic steatosis 01/17/2012  . Cholelithiasis 01/17/2012  . Headache(784.0)   . Leukemia, acute monocytic, in remission     Past Surgical History  Procedure Laterality Date  . Portacath placement      [facility]  . Cholecystectomy  01/31/2012    Procedure: LAPAROSCOPIC CHOLECYSTECTOMY WITH INTRAOPERATIVE CHOLANGIOGRAM;  Surgeon: Clovis Pu. Cornett, MD;  Location: [facility] OR;  Service: General;  Laterality: N/A;    Family History  Problem Relation Age of Onset  . Kidney disease Mother   . Hypertension Mother   . Epilepsy Mother   . Sleep apnea Mother   . Kidney disease Maternal Grandmother   . Diabetes Maternal Grandmother   . Diabetes Father    Social History:  reports that she has never smoked. She has never used smokeless tobacco. She reports that she does not drink alcohol or use illicit drugs.  Allergies:  Allergies  Allergen Reactions  . Ultram (Tramadol Hcl) Nausea And Vomiting    Medications Prior to Admission  Medication Sig Dispense Refill  . ACCU-CHEK FASTCLIX LANCETS MISC Inject 1 Device into the skin 4 (four) times daily.  102 each  12  . glucose blood (ACCU-CHEK SMARTVIEW) test strip 1 each by Other route 4 (four) times daily. Check blood sugar four times daily.  100 each  12  . insulin aspart (NOVOLOG) 100 UNIT/ML injection Inject 20 Units into the skin 3 (three) times daily with meals.  10  mL  12  . insulin NPH (HUMULIN N) 100 UNIT/ML injection Inject 17 Units into the skin at bedtime.  1 vial  12  . insulin NPH (HUMULIN N,NOVOLIN N) 100 UNIT/ML injection Inject 15 Units into the skin 2 (two) times daily.  10 mL  3  . Insulin Syringe-Needle U-100 26G X 1/2" 1 ML MISC 35 Units by Does not apply route AC breakfast.  100 each  6  . labetalol (NORMODYNE) 100 MG tablet Take 1 tablet (100 mg total) by mouth 2 (two) times daily.  60 tablet  2  . Prenatal Vit-Fe Fumarate-FA (MULTIVITAMIN-PRENATAL) 27-0.8 MG TABS Take 1 tablet by mouth daily at 12 noon.  30 each  3  . terconazole (TERAZOL 7) 0.4 % vaginal cream Place 1 applicator vaginally at bedtime.  45 g  0    Results for orders placed during the hospital encounter of 04/25/13 (from the past 48 hour(s))  URINALYSIS, ROUTINE W REFLEX MICROSCOPIC     Status: Abnormal   Collection Time    04/25/13  1:50 AM      Result Value Range   Color, Urine YELLOW  YELLOW   APPearance CLEAR  CLEAR   Specific Gravity, Urine 1.015  1.005 - 1.030   pH 6.0  5.0 - 8.0   Glucose, UA NEGATIVE  NEGATIVE mg/dL   Hgb urine dipstick TRACE (*) NEGATIVE   Bilirubin Urine NEGATIVE  NEGATIVE  Ketones, ur NEGATIVE  NEGATIVE mg/dL   Protein, ur NEGATIVE  NEGATIVE mg/dL   Urobilinogen, UA 0.2  0.0 - 1.0 mg/dL   Nitrite NEGATIVE  NEGATIVE   Leukocytes, UA TRACE (*) NEGATIVE  URINE MICROSCOPIC-ADD ON     Status: None   Collection Time    04/25/13  1:50 AM      Result Value Range   Squamous Epithelial / LPF RARE  RARE   WBC, UA 3-6  <3 WBC/hpf   RBC / HPF 0-2  <3 RBC/hpf  CBC WITH DIFFERENTIAL     Status: Abnormal   Collection Time    04/25/13  3:00 AM      Result Value Range   WBC 10.9 (*) 4.0 - 10.5 K/uL   RBC 3.37 (*) 3.87 - 5.11 MIL/uL   Hemoglobin 9.8 (*) 12.0 - 15.0 g/dL   HCT 16.1 (*) 09.6 - 04.5 %   MCV 82.5  78.0 - 100.0 fL   MCH 29.1  26.0 - 34.0 pg   MCHC 35.3  30.0 - 36.0 g/dL   RDW 40.9  81.1 - 91.4 %   Platelets 219  150 - 400 K/uL    Neutrophils Relative % 83 (*) 43 - 77 %   Neutro Abs 9.1 (*) 1.7 - 7.7 K/uL   Lymphocytes Relative 8 (*) 12 - 46 %   Lymphs Abs 0.8  0.7 - 4.0 K/uL   Monocytes Relative 9  3 - 12 %   Monocytes Absolute 1.0  0.1 - 1.0 K/uL   Eosinophils Relative 0  0 - 5 %   Eosinophils Absolute 0.0  0.0 - 0.7 K/uL   Basophils Relative 0  0 - 1 %   Basophils Absolute 0.0  0.0 - 0.1 K/uL  COMPREHENSIVE METABOLIC PANEL     Status: Abnormal   Collection Time    04/25/13  3:00 AM      Result Value Range   Sodium 137  135 - 145 mEq/L   Potassium 3.7  3.5 - 5.1 mEq/L   Chloride 105  96 - 112 mEq/L   CO2 19  19 - 32 mEq/L   Glucose, Bld 66 (*) 70 - 99 mg/dL   BUN 8  6 - 23 mg/dL   Creatinine, Ser 7.82  0.50 - 1.10 mg/dL   Calcium 9.1  8.4 - 95.6 mg/dL   Total Protein 5.8 (*) 6.0 - 8.3 g/dL   Albumin 2.8 (*) 3.5 - 5.2 g/dL   AST 15  0 - 37 U/L   ALT 14  0 - 35 U/L   Alkaline Phosphatase 72  39 - 117 U/L   Total Bilirubin 1.1  0.3 - 1.2 mg/dL   GFR calc non Af Amer >90  >90 mL/min   GFR calc Af Amer >90  >90 mL/min   Comment:            The eGFR has been calculated     using the CKD EPI equation.     This calculation has not been     validated in all clinical     situations.     eGFR's persistently     <90 mL/min signify     possible Chronic Kidney Disease.     Performed at [facility]  LACTIC ACID, PLASMA     Status: None   Collection Time    04/25/13  3:00 AM      Result Value Range   Lactic Acid, Venous 1.3  0.5 - 2.2 mmol/L   Comment: Performed at [facility]  CULTURE, BLOOD (ROUTINE X 2)     Status: None   Collection Time    04/25/13  3:00 AM      Result Value Range   Specimen Description BLOOD PORT A CATH     Special Requests BOTTLES DRAWN AEROBIC AND ANAEROBIC     Culture  Setup Time       Value: 04/25/2013 10:21     Performed at [facility]   Culture       Value: GRAM NEGATIVE RODS     Note: Gram Stain Report Called to,Read Back By and Verified  With: EMILY CALDWELL ON 04/26/2013 AT 12:21A BY WILEJ     Performed at [facility]   Report Status PENDING    GLUCOSE, CAPILLARY     Status: Abnormal   Collection Time    04/25/13  5:24 AM      Result Value Range   Glucose-Capillary 112 (*) 70 - 99 mg/dL   Comment 1 Notify RN    CULTURE, BLOOD (ROUTINE X 2)     Status: None   Collection Time    04/25/13  6:00 AM      Result Value Range   Specimen Description BLOOD  LEFT BAC     Special Requests Immunocompromised  10 ML BAA     Culture  Setup Time       Value: 04/25/2013 10:22     Performed at [facility]   Culture       Value: GRAM NEGATIVE RODS     Note: Gram Stain Report Called to,Read Back By and Verified With: EMILY CALDWELL ON 04/26/2013 AT 12:21A BY WILEJ     Performed at [facility]   Report Status PENDING    GLUCOSE, CAPILLARY     Status: Abnormal   Collection Time    04/25/13  7:58 AM      Result Value Range   Glucose-Capillary 111 (*) 70 - 99 mg/dL  D-DIMER, QUANTITATIVE     Status: Abnormal   Collection Time    04/25/13  9:05 AM      Result Value Range   D-Dimer, Quant 1.09 (*) 0.00 - 0.48 ug/mL-FEU   Comment:            AT THE INHOUSE ESTABLISHED CUTOFF     VALUE OF 0.48 ug/mL FEU,     THIS ASSAY HAS BEEN DOCUMENTED     IN THE LITERATURE TO HAVE     A SENSITIVITY AND NEGATIVE     PREDICTIVE VALUE OF AT LEAST     98 TO 99%.  THE TEST RESULT     SHOULD BE CORRELATED WITH     AN ASSESSMENT OF THE CLINICAL     PROBABILITY OF DVT / VTE.  GLUCOSE, CAPILLARY     Status: Abnormal   Collection Time    04/25/13  2:43 PM      Result Value Range   Glucose-Capillary 119 (*) 70 - 99 mg/dL  GLUCOSE, CAPILLARY     Status: Abnormal   Collection Time    04/25/13  8:54 PM      Result Value Range   Glucose-Capillary 110 (*) 70 - 99 mg/dL   Comment 1 Notify RN    GLUCOSE, CAPILLARY     Status: Abnormal   Collection Time    04/25/13 10:30 PM      Result Value Range  Glucose-Capillary 153 (*) 70  - 99 mg/dL   Comment 1 Notify RN    CBC     Status: Abnormal   Collection Time    04/26/13  5:30 AM      Result Value Range   WBC 7.0  4.0 - 10.5 K/uL   RBC 2.98 (*) 3.87 - 5.11 MIL/uL   Hemoglobin 8.7 (*) 12.0 - 15.0 g/dL   HCT 16.1 (*) 09.6 - 04.5 %   MCV 83.6  78.0 - 100.0 fL   MCH 29.2  26.0 - 34.0 pg   MCHC 34.9  30.0 - 36.0 g/dL   RDW 40.9  81.1 - 91.4 %   Platelets 185  150 - 400 K/uL  GLUCOSE, CAPILLARY     Status: Abnormal   Collection Time    04/26/13  9:54 AM      Result Value Range   Glucose-Capillary 126 (*) 70 - 99 mg/dL   US Renal  03/29/2955   *RADIOLOGY REPORT*  Clinical Data: Pain.  RENAL/URINARY TRACT ULTRASOUND COMPLETE  Comparison:  CT 01/17/2012  Findings:  Right Kidney:  12.4 cm. Normal size and echotexture.  No focal abnormality.  No hydronephrosis.  Left Kidney:  12.2 cm. Normal size and echotexture.  No focal abnormality.  No hydronephrosis.  Bladder:  Normal appearance without significant postvoid residual.  IMPRESSION: Unremarkable study.   Original Report Authenticated By: Charlett Nose, M.D.   Dg Chest Port 1 View  04/25/2013   *RADIOLOGY REPORT*  Clinical Data: Pregnant patient.  Tachypnea.  PORTABLE CHEST - 1 VIEW  Comparison: PA and lateral chest 01/19/2012.  Findings: Lungs are clear.  Heart size is upper normal.  No pneumothorax or pleural fluid.  Right IJ approach Port-A-Cath is noted.  No pneumothorax or pleural effusion.  IMPRESSION: No acute disease.   Original Report Authenticated By: Holley Dexter, M.D.    Review of Systems  Constitutional: Positive for fever and chills.  Respiratory: Negative for cough.   Cardiovascular: Negative for chest pain.  Gastrointestinal: Positive for nausea, vomiting and abdominal pain.  Neurological: Positive for headaches.    Blood pressure 121/85, pulse 93, temperature 97.6 F (36.4 C), temperature source Oral, resp. rate 18, height 5\' 2"  (1.575 m), weight 286 lb (129.729 kg), last menstrual period 12/26/2012,  SpO2 100.00%. Physical Exam  Constitutional: She is oriented to person, place, and time. She appears well-developed.  Cardiovascular: Normal rate, regular rhythm and normal heart sounds.   No murmur heard. Respiratory: Effort normal and breath sounds normal. She has no wheezes.  R PAC intact  GI: Soft. Bowel sounds are normal. She exhibits distension.  obese  Musculoskeletal: Normal range of motion.  Neurological: She is alert and oriented to person, place, and time.  Skin: Skin is warm and dry.  Psychiatric: She has a normal mood and affect. Her behavior is normal. Judgment and thought content normal.     Assessment/Plan Bacteremia; fever Rt PAC intact- placed 04/2012; prob infection Scheduled for removal now Pt aware of procedure benefits and risks and agreeable to proceed Consent signed and in chart  Javeah Loeza A 04/26/2013, 2:02 PM

## 2013-04-26 NOTE — Consult Note (Signed)
[facility]     Reason for Consult: GNR bacteremia    Referring Physician: Dr. Penne Lash  Active Problems:   * No active hospital problems. * bacteremia    . clotrimazole  1 Applicatorful Vaginal QHS  . docusate sodium  100 mg Oral Daily  . insulin aspart  0-15 Units Subcutaneous TID WC  . insulin aspart  10 Units Subcutaneous TID WC  . metronidazole  500 mg Intravenous Q8H  . pantoprazole  40 mg Oral BID  . piperacillin-tazobactam (ZOSYN)  IV  3.375 g Intravenous Q8H  . prenatal multivitamin  1 tablet Oral Q1200    Recommendations: Continue on Zosyn pending ID and sensitivities I will d/c flagyl since Zosyn broad including anaerobes  Remove port (placed by IR 04/2012) Repeat blood cultures to assure clearance once port is removed  Assessment: GNR in blood, presented with SIRS-like picture, likely line infection.  Her UA is pretty unremarkable and she is asymptomatic from that standpoint so doubt urinary source.    Antibiotics: Zosyn day 1 Ceftriaxone 1 day  HPI: Melissa Washington is a 27 y.o. female with a history of promyelocytic leukemia diagnosed March 2013 and completed ATRA and Arsenic and is currently in remission.  She completed treatment in September of 2013 at [facility].  Her course was complicated by Klebsiella bacteremia likely line related and had port a cath changed in August of 2013.  Also she had a gluteal abscess during the time of her chemotherapy with Staph aureus treated with Keflex.   Cholecytitis and cholecystectomy in May 2013.  Currently pregnant.  Seen by Dr. Cyndie Chime MAy this year and [facility] in April of this year.  No current plans for further chemo.     Review of Systems: A comprehensive review of systems was negative.  Past Medical History  Diagnosis Date  . Diabetes mellitus   . Hypertension   . Nephrolithiasis   . 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 Hepatic steatosis 01/17/2012  .  Cholelithiasis 01/17/2012  . Headache(784.0)   . Leukemia, acute monocytic, in remission     History  Substance Use Topics  . Smoking status: Never Smoker   . Smokeless tobacco: Never Used  . Alcohol Use: No    Family History  Problem Relation Age of Onset  . Kidney disease Mother   . Hypertension Mother   . Epilepsy Mother   . Sleep apnea Mother   . Kidney disease Maternal Grandmother   . Diabetes Maternal Grandmother   . Diabetes Father    Allergies  Allergen Reactions  . Ultram (Tramadol Hcl) Nausea And Vomiting    OBJECTIVE: Blood pressure 118/68, pulse 89, temperature 97.6 F (36.4 C), temperature source Oral, resp. rate 18, height 5\' 2"  (1.575 m), weight 286 lb (129.729 kg), last menstrual period 12/26/2012, SpO2 100.00%. General: Awake, alert, nad Skin: no rashes, port without pus or erythema, covered at insertion Lungs: CTA B Cor: RRR without m Abdomen: morbidly obese, nt, nd Ext: no edema  Microbiology: Recent Results (from the past 240 hour(s))  WET PREP, GENITAL     Status: Abnormal   Collection Time    04/24/13 11:12 AM      Result Value Range Status   Yeast Wet Prep HPF POC MOD (*) NONE SEEN Final   Trich, Wet Prep NONE SEEN  NONE SEEN Final   Clue Cells Wet Prep HPF POC NONE SEEN  NONE SEEN Final  WBC, Wet Prep HPF POC FEW  NONE SEEN Final  CULTURE, BLOOD (ROUTINE X 2)     Status: None   Collection Time    04/25/13  3:00 AM      Result Value Range Status   Specimen Description BLOOD PORT A CATH   Final   Special Requests BOTTLES DRAWN AEROBIC AND ANAEROBIC   Final   Culture  Setup Time     Final   Value: 04/25/2013 10:21     Performed at [facility]   Culture     Final   Value: GRAM NEGATIVE RODS     Note: Gram Stain Report Called to,Read Back By and Verified With: EMILY CALDWELL ON 04/26/2013 AT 12:21A BY WILEJ     Performed at [facility]   Report Status PENDING   Incomplete  CULTURE, BLOOD (ROUTINE X 2)     Status:  None   Collection Time    04/25/13  6:00 AM      Result Value Range Status   Specimen Description BLOOD  LEFT BAC   Final   Special Requests Immunocompromised  10 ML BAA   Final   Culture  Setup Time     Final   Value: 04/25/2013 10:22     Performed at [facility]   Culture     Final   Value: GRAM NEGATIVE RODS     Note: Gram Stain Report Called to,Read Back By and Verified With: EMILY CALDWELL ON 04/26/2013 AT 12:21A BY Serafina Mitchell     Performed at [facility]   Report Status PENDING   Incomplete    Leelyn Jasinski, Molly Maduro, MD [facility] [email or web address] [phone] pager  [phone] cell 04/26/2013, 10:56 AM

## 2013-04-26 NOTE — Procedures (Signed)
RIJV PAC removal No comp 

## 2013-04-27 DIAGNOSIS — A498 Other bacterial infections of unspecified site: Secondary | ICD-10-CM

## 2013-04-27 DIAGNOSIS — R7881 Bacteremia: Secondary | ICD-10-CM

## 2013-04-27 LAB — CBC
HCT: 27.1 % — ABNORMAL LOW (ref 36.0–46.0)
Hemoglobin: 9.6 g/dL — ABNORMAL LOW (ref 12.0–15.0)
MCHC: 35.4 g/dL (ref 30.0–36.0)
MCV: 82.9 fL (ref 78.0–100.0)
RDW: 13.7 % (ref 11.5–15.5)

## 2013-04-27 LAB — URINE CULTURE: Colony Count: 75000

## 2013-04-27 LAB — GLUCOSE, CAPILLARY
Glucose-Capillary: 139 mg/dL — ABNORMAL HIGH (ref 70–99)
Glucose-Capillary: 76 mg/dL (ref 70–99)
Glucose-Capillary: 120 mg/dL — ABNORMAL HIGH (ref 70–99)

## 2013-04-27 LAB — CULTURE, BLOOD (ROUTINE X 2)

## 2013-04-27 MED ORDER — DEXTROSE 5 % IV SOLN: 2.0000 g | INTRAVENOUS | Status: DC

## 2013-04-27 MED ORDER — DEXTROSE 5 % IV SOLN
1.0000 g | Freq: Two times a day (BID) | INTRAVENOUS | Status: DC
  Administered 2013-04-27 – 2013-05-01 (×9): 1 g via INTRAVENOUS
  Filled 2013-04-27 (×11): qty 10

## 2013-04-27 NOTE — Progress Notes (Addendum)
[facility]  Date of Admission:  04/25/2013  Antibiotics: zosyn  Subjective: Getting port out now at [facility]  Objective: Temp:  [98.3 F (36.8 C)-99.1 F (37.3 C)] 98.6 F (37 C) (08/07 1000) Pulse Rate:  [84-95] 87 (08/07 1000) Resp:  [16-18] 18 (08/07 1000) BP: (102-129)/(66-83) 125/74 mmHg (08/07 1000) SpO2:  [97 %-100 %] 98 % (08/07 1000) Weight:  [295 lb 12 oz (134.151 kg)] 295 lb 12 oz (134.151 kg) (08/07 0601)  Not examined  Lab Results Lab Results  Component Value Date   WBC 8.0 04/27/2013   HGB 9.6* 04/27/2013   HCT 27.1* 04/27/2013   MCV 82.9 04/27/2013   PLT 212 04/27/2013    Lab Results  Component Value Date   CREATININE 0.60 04/25/2013   BUN 8 04/25/2013   NA 137 04/25/2013   K 3.7 04/25/2013   CL 105 04/25/2013   CO2 19 04/25/2013    Lab Results  Component Value Date   ALT 14 04/25/2013   AST 15 04/25/2013   ALKPHOS 72 04/25/2013   BILITOT 1.1 04/25/2013      Microbiology: Recent Results (from the past 240 hour(s))  WET PREP, GENITAL     Status: Abnormal   Collection Time    04/24/13 11:12 AM      Result Value Range Status   Yeast Wet Prep HPF POC MOD (*) NONE SEEN Final   Trich, Wet Prep NONE SEEN  NONE SEEN Final   Clue Cells Wet Prep HPF POC NONE SEEN  NONE SEEN Final   WBC, Wet Prep HPF POC FEW  NONE SEEN Final  CULTURE, BLOOD (ROUTINE X 2)     Status: None   Collection Time    04/25/13  3:00 AM      Result Value Range Status   Specimen Description BLOOD PORT A CATH   Final   Special Requests BOTTLES DRAWN AEROBIC AND ANAEROBIC   Final   Culture  Setup Time     Final   Value: 04/25/2013 10:21     Performed at [facility]   Culture     Final   Value: GRAM NEGATIVE RODS     Note: Gram Stain Report Called to,Read Back By and Verified With: EMILY CALDWELL ON 04/26/2013 AT 12:21A BY WILEJ     Performed at [facility]   Report Status PENDING   Incomplete  CULTURE, BLOOD (ROUTINE X 2)     Status: None   Collection  Time    04/25/13  6:00 AM      Result Value Range Status   Specimen Description BLOOD  LEFT BAC   Final   Special Requests Immunocompromised  10 ML BAA   Final   Culture  Setup Time 04/25/2013 10:22   Final   Culture     Final   Value: ESCHERICHIA COLI     Note: Gram Stain Report Called to,Read Back By and Verified With: EMILY CALDWELL ON 04/26/2013 AT 12:21A BY WILEJ   Report Status 04/27/2013 FINAL   Final   Organism ID, Bacteria ESCHERICHIA COLI   Final  URINE CULTURE     Status: None   Collection Time    04/25/13  2:50 PM      Result Value Range Status   Specimen Description URINE, CLEAN CATCH   Final   Special Requests NONE   Final   Culture  Setup Time     Final   Value: 04/25/2013  22:45     Performed at [facility] Count PENDING   Incomplete   Culture     Final   Value: Culture reincubated for better growth     Performed at [facility]   Report Status PENDING   Incomplete    Studies/Results: No results found.  Assessment/Plan: 1) E coli bacteremia - it is pansensitive and likely originally a urinary source, +/- seeding of line.  Now port out and does not appear that she will need access.  -repeat blood cultures today and if remain negative at 72 hours she can get picc placed - she can go out on ceftriaxone 1 gram q 12 hours through 8/20   Staci Righter, MD [facility] [email or web address] [phone] pager   [phone] cell 04/27/2013, 1:04 PM

## 2013-04-27 NOTE — Progress Notes (Signed)
Patient ID: Melissa Washington, female   DOB: 19-May-1986, 27 y.o.   MRN: [id number] Patient ID: Melissa Washington, female   DOB: 12/03/1985, 27 y.o.   MRN: [id number] FACULTY PRACTICE ANTEPARTUM(COMPREHENSIVE) NOTE  Melissa Washington is a 27 y.o. G2P1001 at [redacted]w[redacted]d  Status post removal of port a cath due to Gram negative rod sepsis. Length of Stay:  2  Days  Subjective: Epigastric pain. Patient reports the fetal movement as active. Patient reports uterine contraction  activity as none. Patient reports  vaginal bleeding as none. Patient describes fluid per vagina as None.  Vitals:  Blood pressure 102/67, pulse 84, temperature 98.6 F (37 C), temperature source Oral, resp. rate 16, height 5\' 2"  (1.575 m), weight 295 lb 12 oz (134.151 kg), last menstrual period 12/26/2012, SpO2 97.00%. Physical Examination:  General appearance - alert, well appearing, and in no distress Abdomen - soft, obese, very minimal tenderness in epigastric area.  NO rebound, no guarding. Extremities - no edema, redness or tenderness in the calves or thighs  Fetal Monitoring:   Fh q shift  Labs:  Results for orders placed during the hospital encounter of 04/25/13 (from the past 24 hour(s))  GLUCOSE, CAPILLARY   Collection Time    04/26/13  9:54 AM      Result Value Range   Glucose-Capillary 126 (*) 70 - 99 mg/dL  GLUCOSE, CAPILLARY   Collection Time    04/26/13  5:02 PM      Result Value Range   Glucose-Capillary 58 (*) 70 - 99 mg/dL  GLUCOSE, CAPILLARY   Collection Time    04/26/13  9:38 PM      Result Value Range   Glucose-Capillary 150 (*) 70 - 99 mg/dL     Medications:  Scheduled . clotrimazole  1 Applicatorful Vaginal QHS  . docusate sodium  100 mg Oral Daily  . insulin aspart  0-15 Units Subcutaneous TID WC  . insulin aspart  10 Units Subcutaneous TID WC  . insulin NPH  10 Units Subcutaneous BID AC & HS  . pantoprazole  40 mg Oral BID  . piperacillin-tazobactam (ZOSYN)  IV  3.375 g Intravenous Q8H   . prenatal multivitamin  1 tablet Oral Q1200   I have reviewed the patient's current medications.  ASSESSMENT: Gram negative rod sepsis status post port a cath removal on 04/26/2013 Clinically much improved  Pregnancy wise no issues Patient Active Problem List   Diagnosis Date Noted  . Diabetes mellitus, antepartum 02/20/2013  . Benign essential hypertension antepartum 02/20/2013  . Obesity complicating peripregnancy, antepartum 02/20/2013  . Supervision of high-risk pregnancy 01/19/2013  . 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 Morbid obesity with BMI of 50.0-59.9, adult 01/20/2012  . DM (diabetes mellitus), type 2, uncontrolled 12/30/2006  . HYPERTENSION, BENIGN ESSENTIAL 12/30/2006    PLAN: ID to follow Will require prolonged IV antibiotics    Melissa Washington H 04/27/2013,7:49 AM

## 2013-04-28 LAB — CBC
HCT: 26.5 % — ABNORMAL LOW (ref 36.0–46.0)
Hemoglobin: 9.4 g/dL — ABNORMAL LOW (ref 12.0–15.0)
MCHC: 35.5 g/dL (ref 30.0–36.0)
RBC: 3.2 MIL/uL — ABNORMAL LOW (ref 3.87–5.11)
WBC: 7.2 10*3/uL (ref 4.0–10.5)

## 2013-04-28 LAB — GLUCOSE, CAPILLARY
Glucose-Capillary: 87 mg/dL (ref 70–99)
Glucose-Capillary: 126 mg/dL — ABNORMAL HIGH (ref 70–99)

## 2013-04-28 LAB — CULTURE, BLOOD (ROUTINE X 2)

## 2013-04-28 NOTE — Progress Notes (Signed)
Patient ID: Melissa Washington, female   DOB: November 08, 1985, 27 y.o.   MRN: [id number] FACULTY PRACTICE ANTEPARTUM(COMPREHENSIVE) NOTE  Melissa Washington is a 27 y.o. G2P1001 at [redacted]w[redacted]d who presented with sepsis from [facility] bacteremia thought to be due to a portacath status post removal. Length of Stay:  3  Days  Subjective: Mild abdominal cramping.  No fevers, chills, rashes, dysuria.  Patient reports the fetal movement as active. Patient reports uterine contraction  activity as none. Patient reports  vaginal bleeding as none. Patient describes fluid per vagina as None.  Vitals:  Blood pressure 113/53, pulse 79, temperature 98.1 F (36.7 C), temperature source Oral, resp. rate 18, height 5\' 2"  (1.575 m), weight 133.074 kg (293 lb 6 oz), last menstrual period 12/26/2012, SpO2 96.00%. Physical Examination:  General appearance - alert, well appearing, and in no distress CV: rrr Pulm: LCTAB Abdomen - soft, obese, very minimal tenderness in epigastric area.  NO rebound, no guarding. Extremities - no edema, redness or tenderness in the calves or thighs  Fetal Monitoring:   Fh q shift  Labs:  Results for orders placed during the hospital encounter of 04/25/13 (from the past 24 hour(s))  GLUCOSE, CAPILLARY   Collection Time    04/27/13 10:11 AM      Result Value Range   Glucose-Capillary 139 (*) 70 - 99 mg/dL  CBC   Collection Time    04/27/13 10:19 AM      Result Value Range   WBC 8.0  4.0 - 10.5 K/uL   RBC 3.27 (*) 3.87 - 5.11 MIL/uL   Hemoglobin 9.6 (*) 12.0 - 15.0 g/dL   HCT 82.9 (*) 56.2 - 13.0 %   MCV 82.9  78.0 - 100.0 fL   MCH 29.4  26.0 - 34.0 pg   MCHC 35.4  30.0 - 36.0 g/dL   RDW 86.5  78.4 - 69.6 %   Platelets 212  150 - 400 K/uL  GLUCOSE, CAPILLARY   Collection Time    04/27/13  3:49 PM      Result Value Range   Glucose-Capillary 76  70 - 99 mg/dL  GLUCOSE, CAPILLARY   Collection Time    04/27/13  9:07 PM      Result Value Range   Glucose-Capillary 120 (*) 70 - 99  mg/dL  GLUCOSE, CAPILLARY   Collection Time    04/27/13 10:46 PM      Result Value Range   Glucose-Capillary 124 (*) 70 - 99 mg/dL  CBC   Collection Time    04/28/13  5:30 AM      Result Value Range   WBC 7.2  4.0 - 10.5 K/uL   RBC 3.20 (*) 3.87 - 5.11 MIL/uL   Hemoglobin 9.4 (*) 12.0 - 15.0 g/dL   HCT 29.5 (*) 28.4 - 13.2 %   MCV 82.8  78.0 - 100.0 fL   MCH 29.4  26.0 - 34.0 pg   MCHC 35.5  30.0 - 36.0 g/dL   RDW 44.0  10.2 - 72.5 %   Platelets 216  150 - 400 K/uL     Medications:  Scheduled . cefTRIAXone (ROCEPHIN)  IV  1 g Intravenous Q12H  . clotrimazole  1 Applicatorful Vaginal QHS  . docusate sodium  100 mg Oral Daily  . insulin aspart  0-15 Units Subcutaneous TID WC  . insulin aspart  10 Units Subcutaneous TID WC  . insulin NPH  10 Units Subcutaneous BID AC & HS  . pantoprazole  40  mg Oral BID  . prenatal multivitamin  1 tablet Oral Q1200   I have reviewed the patient's current medications.  ASSESSMENT: Gram negative rod sepsis status post port a cath removal on 04/26/2013 Clinically much improved  Pregnancy wise no issues Patient Active Problem List   Diagnosis Date Noted  . Diabetes mellitus, antepartum 02/20/2013  . Benign essential hypertension antepartum 02/20/2013  . Obesity complicating peripregnancy, antepartum 02/20/2013  . Supervision of high-risk pregnancy 01/19/2013  . 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 Morbid obesity with BMI of 50.0-59.9, adult 01/20/2012  . DM (diabetes mellitus), type 2, uncontrolled 12/30/2006  . HYPERTENSION, BENIGN ESSENTIAL 12/30/2006    PLAN:  1) Ecoli bacteremia - ID following - now on rocephin  - will need long term abx - cx from 8/7- if negative @ 72 hours then plan for PICC and d/c to home  2) FWB - no concerns currently  - FHR yesterday WNL  3) d/c likely Monday with HH for abx    Melissa Washington L 04/28/2013,7:55 AM

## 2013-04-28 NOTE — Progress Notes (Addendum)
[facility]  Date of Admission:  04/25/2013  Antibiotics: Ceftriaxone day 2  Subjective: Port out  Objective: Temp:  [97.6 F (36.4 C)-98.5 F (36.9 C)] 97.6 F (36.4 C) (08/08 1000) Pulse Rate:  [52-93] 93 (08/08 1000) Resp:  [18-20] 18 (08/08 1000) BP: (105-141)/(52-85) 139/85 mmHg (08/08 1000) SpO2:  [93 %-100 %] 93 % (08/08 1000) Weight:  [293 lb 6 oz (133.074 kg)] 293 lb 6 oz (133.074 kg) (08/08 0616)  Gen: awake, alert, nad CV: RRR Lungs: CTA B Abd: obese, nt, nd  Lab Results Lab Results  Component Value Date   WBC 7.2 04/28/2013   HGB 9.4* 04/28/2013   HCT 26.5* 04/28/2013   MCV 82.8 04/28/2013   PLT 216 04/28/2013    Lab Results  Component Value Date   CREATININE 0.60 04/25/2013   BUN 8 04/25/2013   NA 137 04/25/2013   K 3.7 04/25/2013   CL 105 04/25/2013   CO2 19 04/25/2013    Lab Results  Component Value Date   ALT 14 04/25/2013   AST 15 04/25/2013   ALKPHOS 72 04/25/2013   BILITOT 1.1 04/25/2013      Microbiology: Recent Results (from the past 240 hour(s))  WET PREP, GENITAL     Status: Abnormal   Collection Time    04/24/13 11:12 AM      Result Value Range Status   Yeast Wet Prep HPF POC MOD (*) NONE SEEN Final   Trich, Wet Prep NONE SEEN  NONE SEEN Final   Clue Cells Wet Prep HPF POC NONE SEEN  NONE SEEN Final   WBC, Wet Prep HPF POC FEW  NONE SEEN Final  CULTURE, BLOOD (ROUTINE X 2)     Status: None   Collection Time    04/25/13  3:00 AM      Result Value Range Status   Specimen Description BLOOD PORT A CATH   Final   Special Requests BOTTLES DRAWN AEROBIC AND ANAEROBIC   Final   Culture  Setup Time     Final   Value: 04/25/2013 10:21     Performed at [facility]   Culture     Final   Value: ESCHERICHIA COLI     Note: SUSCEPTIBILITIES PERFORMED ON PREVIOUS CULTURE WITHIN THE LAST 5 DAYS.     Note: Gram Stain Report Called to,Read Back By and Verified With: EMILY CALDWELL ON 04/26/2013 AT 12:21A BY WILEJ     Performed  at [facility]   Report Status 04/28/2013 FINAL   Final  CULTURE, BLOOD (ROUTINE X 2)     Status: None   Collection Time    04/25/13  6:00 AM      Result Value Range Status   Specimen Description BLOOD  LEFT BAC   Final   Special Requests Immunocompromised  10 ML BAA   Final   Culture  Setup Time 04/25/2013 10:22   Final   Culture     Final   Value: ESCHERICHIA COLI     Note: Gram Stain Report Called to,Read Back By and Verified With: EMILY CALDWELL ON 04/26/2013 AT 12:21A BY WILEJ   Report Status 04/27/2013 FINAL   Final   Organism ID, Bacteria ESCHERICHIA COLI   Final  URINE CULTURE     Status: None   Collection Time    04/25/13  2:50 PM      Result Value Range Status   Specimen Description URINE, CLEAN CATCH  Final   Special Requests NONE   Final   Culture  Setup Time     Final   Value: 04/25/2013 22:45     Performed at [facility]   Colony Count     Final   Value: 75,000 COLONIES/ML     Performed at [facility]   Culture     Final   Value: DIPHTHEROIDS(CORYNEBACTERIUM SPECIES)     Note: Standardized susceptibility testing for this organism is not available.     Performed at [facility]   Report Status 04/27/2013 FINAL   Final  CULTURE, BLOOD (ROUTINE X 2)     Status: None   Collection Time    04/27/13  1:45 PM      Result Value Range Status   Specimen Description BLOOD RIGHT ARM   Final   Special Requests BOTTLES DRAWN AEROBIC AND ANAEROBIC 5.5CC   Final   Culture  Setup Time     Final   Value: 04/27/2013 16:12     Performed at [facility]   Culture     Final   Value:        BLOOD CULTURE RECEIVED NO GROWTH TO DATE CULTURE WILL BE HELD FOR 5 DAYS BEFORE ISSUING A FINAL NEGATIVE REPORT     Performed at [facility]   Report Status PENDING   Incomplete  CULTURE, BLOOD (ROUTINE X 2)     Status: None   Collection Time    04/27/13  1:53 PM      Result Value Range Status   Specimen Description BLOOD RIGHT ARM   Final    Special Requests BOTTLES DRAWN AEROBIC AND ANAEROBIC 5.5CC   Final   Culture  Setup Time     Final   Value: 04/27/2013 16:12     Performed at [facility]   Culture     Final   Value:        BLOOD CULTURE RECEIVED NO GROWTH TO DATE CULTURE WILL BE HELD FOR 5 DAYS BEFORE ISSUING A FINAL NEGATIVE REPORT     Performed at [facility]   Report Status PENDING   Incomplete    Studies/Results: No results found.  Assessment/Plan: 1) E coli bacteremia - it is pansensitive and likely originally a urinary source, +/- seeding of line.  Now port out and does not appear that she will need access with a new port for chemo.   -repeat blood cultures currently negative and if remain negative at 72 hours she can get picc placed - she can go out on ceftriaxone 1 gram q 12 hours through 8/20 -we will arrange follow up with [facility] on or around 8/20   Thanks for consult Please call with questions   Staci Righter, MD [email or web address] [phone] pager   [phone] cell 04/28/2013, 2:21 PM

## 2013-04-29 LAB — CBC
Hemoglobin: 9.4 g/dL — ABNORMAL LOW (ref 12.0–15.0)
MCH: 29.4 pg (ref 26.0–34.0)
MCHC: 35.5 g/dL (ref 30.0–36.0)
RDW: 13.6 % (ref 11.5–15.5)

## 2013-04-29 MED ORDER — FLUCONAZOLE 150 MG PO TABS
150.0000 mg | ORAL_TABLET | Freq: Once | ORAL | Status: AC
  Administered 2013-04-29: 150 mg via ORAL
  Filled 2013-04-29: qty 1

## 2013-04-29 NOTE — Progress Notes (Signed)
FACULTY PRACTICE ANTEPARTUM(COMPREHENSIVE) NOTE  Melissa Washington is a 27 y.o. G2P1001 at [redacted]w[redacted]d by u/s who is admitted for sepsis due to E Coli Bacteremia..   Fetal presentation is unsure. Length of Stay:  4  Days  Subjective: No complaints. Patient reports the fetal movement as active. Patient reports uterine contraction  activity as none. Patient reports  vaginal bleeding as none. Patient describes fluid per vagina as .  Vitals:  Blood pressure 97/62, pulse 77, temperature 97.9 F (36.6 C), temperature source Oral, resp. rate 20, height 5\' 2"  (1.575 m), weight 129.729 kg (286 lb), last menstrual period 12/26/2012, SpO2 99.00%. Physical Examination:  General appearance - alert, well appearing, and in no distress, overweight and well hydrated Heart - normal rate and regular rhythm Abdomen - soft, nontender, nondistended Fundal Height:  size equals dates Cervical Exam: Not evaluated. . Extremities: extremities normal, atraumatic, no cyanosis or edema and Homans sign is negative, no sign of DVT with DTRs 2+ bilaterally Membranes:  Fetal Monitoring:  doppler q day  Labs:  Results for orders placed during the hospital encounter of 04/25/13 (from the past 24 hour(s))  GLUCOSE, CAPILLARY   Collection Time    04/28/13  9:53 AM      Result Value Range   Glucose-Capillary 87  70 - 99 mg/dL  GLUCOSE, CAPILLARY   Collection Time    04/28/13  4:15 PM      Result Value Range   Glucose-Capillary 71  70 - 99 mg/dL  GLUCOSE, CAPILLARY   Collection Time    04/28/13  8:51 PM      Result Value Range   Glucose-Capillary 126 (*) 70 - 99 mg/dL   Comment 1 Notify RN    CBC   Collection Time    04/29/13  5:45 AM      Result Value Range   WBC 8.4  4.0 - 10.5 K/uL   RBC 3.20 (*) 3.87 - 5.11 MIL/uL   Hemoglobin 9.4 (*) 12.0 - 15.0 g/dL   HCT 47.8 (*) 29.5 - 62.1 %   MCV 82.8  78.0 - 100.0 fL   MCH 29.4  26.0 - 34.0 pg   MCHC 35.5  30.0 - 36.0 g/dL   RDW 30.8  65.7 - 84.6 %   Platelets  224  150 - 400 K/uL  GLUCOSE, CAPILLARY   Collection Time    04/29/13  8:13 AM      Result Value Range   Glucose-Capillary 91  70 - 99 mg/dL    Medications:  Scheduled . cefTRIAXone (ROCEPHIN)  IV  1 g Intravenous Q12H  . clotrimazole  1 Applicatorful Vaginal QHS  . docusate sodium  100 mg Oral Daily  . insulin aspart  0-15 Units Subcutaneous TID WC  . insulin aspart  10 Units Subcutaneous TID WC  . insulin NPH  10 Units Subcutaneous BID AC & HS  . pantoprazole  40 mg Oral BID  . prenatal multivitamin  1 tablet Oral Q1200   I have reviewed the patient's current medications.  ASSESSMENT: E Coli sepsis with current blood culture negative x 48 hr   Patient Active Problem List   Diagnosis Date Noted  . Diabetes mellitus, antepartum 02/20/2013  . Benign essential hypertension antepartum 02/20/2013  . Obesity complicating peripregnancy, antepartum 02/20/2013  . Supervision of high-risk pregnancy 01/19/2013  . 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 Morbid obesity with BMI of 50.0-59.9, adult 01/20/2012  . DM (diabetes mellitus), type 2, uncontrolled 12/30/2006  .  HYPERTENSION, BENIGN ESSENTIAL 12/30/2006    PLAN: IV rocephin bid thru weekend. Reinsert PICC line Monday , to complete 14 days IV ABX as outpt. Current insulin unchanged at [facility] 10/10  Daily novolog  10 units/ meal plus SSI postprandial  Prim Morace V 04/29/2013,9:26 AM

## 2013-04-30 LAB — CBC
Hemoglobin: 9.7 g/dL — ABNORMAL LOW (ref 12.0–15.0)
MCH: 29.5 pg (ref 26.0–34.0)
MCHC: 35 g/dL (ref 30.0–36.0)
MCV: 84.2 fL (ref 78.0–100.0)
Platelets: 246 10*3/uL (ref 150–400)
RBC: 3.29 MIL/uL — ABNORMAL LOW (ref 3.87–5.11)

## 2013-04-30 LAB — GLUCOSE, CAPILLARY: Glucose-Capillary: 78 mg/dL (ref 70–99)

## 2013-04-30 NOTE — Progress Notes (Signed)
Patient ID: Melissa Washington, female   DOB: March 27, 1986, 27 y.o.   MRN: [id number] ACULTY PRACTICE ANTEPARTUM COMPREHENSIVE PROGRESS NOTE  MARYL BLALOCK is a 27 y.o. G2P1001 at [redacted]w[redacted]d  who is admitted for line sepsis.    Length of Stay:  5  Days  Subjective: Pt with no complaints overnight.  She reports occ mild cramping of lower abd 1-2x/day. Patient reports good fetal movement.  She reports no uterine contractions, no bleeding and no loss of fluid per vagina.  Vitals:  Blood pressure 118/71, pulse 97, temperature 98.4 F (36.9 C), temperature source Oral, resp. rate 20, height 5\' 2"  (1.575 m), weight 294 lb 8 oz (133.584 kg), last menstrual period 12/26/2012, SpO2 98.00%. Physical Examination: General appearance - alert, well appearing, and in no distress Abdomen - soft, nontender, nondistended, no masses or organomegaly   Labs:  Results for orders placed during the hospital encounter of 04/25/13 (from the past 24 hour(s))  GLUCOSE, CAPILLARY   Collection Time    04/29/13  4:09 PM      Result Value Range   Glucose-Capillary 89  70 - 99 mg/dL  GLUCOSE, CAPILLARY   Collection Time    04/29/13  8:24 PM      Result Value Range   Glucose-Capillary 109 (*) 70 - 99 mg/dL  CBC   Collection Time    04/30/13  4:55 AM      Result Value Range   WBC 9.3  4.0 - 10.5 K/uL   RBC 3.29 (*) 3.87 - 5.11 MIL/uL   Hemoglobin 9.7 (*) 12.0 - 15.0 g/dL   HCT 40.9 (*) 81.1 - 91.4 %   MCV 84.2  78.0 - 100.0 fL   MCH 29.5  26.0 - 34.0 pg   MCHC 35.0  30.0 - 36.0 g/dL   RDW 78.2  95.6 - 21.3 %   Platelets 246  150 - 400 K/uL    Imaging Studies:    none   Medications:  Scheduled . cefTRIAXone (ROCEPHIN)  IV  1 g Intravenous Q12H  . clotrimazole  1 Applicatorful Vaginal QHS  . docusate sodium  100 mg Oral Daily  . insulin aspart  0-15 Units Subcutaneous TID WC  . insulin aspart  10 Units Subcutaneous TID WC  . insulin NPH  10 Units Subcutaneous BID AC & HS  . pantoprazole  40 mg Oral BID  .  prenatal multivitamin  1 tablet Oral Q1200   I have reviewed the patient's current medications.  ASSESSMENT: Patient Active Problem List   Diagnosis Date Noted  . Diabetes mellitus, antepartum 02/20/2013  . Benign essential hypertension antepartum 02/20/2013  . Obesity complicating peripregnancy, antepartum 02/20/2013  . Supervision of high-risk pregnancy 01/19/2013  . 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 Morbid obesity with BMI of 50.0-59.9, adult 01/20/2012  . DM (diabetes mellitus), type 2, uncontrolled 12/30/2006  . HYPERTENSION, BENIGN ESSENTIAL 12/30/2006    PLAN: Pt has been afebrile for >72 hours.  She will be scheduled for PICC line placement tomorrow with plan for IV atbx at home to complete 14days   Continue routine antenatal care.   HARRAWAY-SMITH, Kenn Rekowski 04/30/2013,8:58 AM

## 2013-05-01 ENCOUNTER — Inpatient Hospital Stay (HOSPITAL_COMMUNITY): Payer: Medicaid Other

## 2013-05-01 ENCOUNTER — Encounter (HOSPITAL_COMMUNITY): Payer: Self-pay | Admitting: Obstetrics & Gynecology

## 2013-05-01 ENCOUNTER — Other Ambulatory Visit: Payer: Medicaid Other | Admitting: Lab

## 2013-05-01 ENCOUNTER — Ambulatory Visit: Payer: Medicaid Other | Admitting: Nurse Practitioner

## 2013-05-01 DIAGNOSIS — R7881 Bacteremia: Secondary | ICD-10-CM

## 2013-05-01 DIAGNOSIS — B9620 Unspecified Escherichia coli [E. coli] as the cause of diseases classified elsewhere: Secondary | ICD-10-CM | POA: Diagnosis present

## 2013-05-01 HISTORY — DX: Unspecified Escherichia coli (E. coli) as the cause of diseases classified elsewhere: B96.20

## 2013-05-01 HISTORY — DX: Bacteremia: R78.81

## 2013-05-01 LAB — CBC
HCT: 28.8 % — ABNORMAL LOW (ref 36.0–46.0)
Hemoglobin: 10 g/dL — ABNORMAL LOW (ref 12.0–15.0)
WBC: 9.2 10*3/uL (ref 4.0–10.5)

## 2013-05-01 LAB — GLUCOSE, CAPILLARY
Glucose-Capillary: 87 mg/dL (ref 70–99)
Glucose-Capillary: 116 mg/dL — ABNORMAL HIGH (ref 70–99)

## 2013-05-01 MED ORDER — SODIUM CHLORIDE 0.9 % IJ SOLN: 10.0000 mL | INTRAMUSCULAR | Status: DC | PRN

## 2013-05-01 MED ORDER — DEXTROSE 5 % IV SOLN: 1.0000 g | Freq: Two times a day (BID) | INTRAVENOUS | Status: DC

## 2013-05-01 MED ORDER — OXYCODONE-ACETAMINOPHEN 5-325 MG PO TABS: 1.0000 | ORAL_TABLET | Freq: Four times a day (QID) | ORAL | Status: DC | PRN

## 2013-05-01 MED ORDER — HEPARIN SOD (PORK) LOCK FLUSH 100 UNIT/ML IV SOLN
250.0000 [IU] | INTRAVENOUS | Status: AC | PRN
  Administered 2013-05-01: 250 [IU]
  Filled 2013-05-01: qty 3

## 2013-05-01 NOTE — Progress Notes (Signed)
Peripherally Inserted Central Catheter/Midline Placement  The IV Nurse has discussed with the patient and/or persons authorized to consent for the patient, the purpose of this procedure and the potential benefits and risks involved with this procedure.  The benefits include less needle sticks, lab draws from the catheter and patient may be discharged home with the catheter.  Risks include, but not limited to, infection, bleeding, blood clot (thrombus formation), and puncture of an artery; nerve damage and irregular heat beat.  Alternatives to this procedure were also discussed.  PICC/Midline Placement Documentation        Vevelyn Pat 05/01/2013, 1:12 PM

## 2013-05-01 NOTE — Discharge Summary (Signed)
Antenatal Physician Discharge Summary  Patient ID: Melissa Washington MRN: [id number] DOB/AGE: [birth year] 27 y.o.  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 PICC line placement on 05/01/13.  Intrapartum Procedures: Infectious Diseases and Interventional Radiology  Treatments: Zosyn and Ceftriaxone; Zosyn discontinued on 04/27/13 and Ceftriaxone was continued  Hospital Course:  This is a 27 y.o. G2P1001 with IUP at [redacted]w[redacted]d admitted for bacteremia and sepsis due to PAC infection. She has a history of acute promyelocytic leukemia currently in remission with no current chemotherapy, but had port-a-cath (PAC) in place since 04/2012 when she received her last dose of chemotherapy.  Of note, she had Klebsiella bacteremia of a previous PAC in 01/2012 which required PAC replacement. She was admitted and started on IV antibiotics with the recommendations of Infectious Disease (ID)s. Her PAC was removed on 04/26/13 by Interventional Radiology.  Her blood cultures grew out E.coli, and her therapy was changed to Ceftriaxone twice a day.  She was observed and her fevers subsided; she remained afebrile since 04/25/13.   From an obstetric standpoint, the fetal heart rates were normal, and she had no signs/symptoms of preterm labor or other obstetric concerns. On 05/02/11, she underwent PICC line placement for home therapy with Ceftriaxone until 05/10/13 as specified by ID.  She was  discharged to home with Home Health Services and outpatient follow up.  Significant Diagnostic Studies:  Results for orders placed during the hospital encounter of 04/25/13 (from the past 168 hour(s))  URINALYSIS, ROUTINE W REFLEX MICROSCOPIC   Collection Time    04/25/13  1:50 AM      Result Value Range   Color, Urine YELLOW  YELLOW   APPearance CLEAR  CLEAR   Specific Gravity, Urine 1.015  1.005 - 1.030   pH 6.0  5.0 - 8.0   Glucose, UA NEGATIVE  NEGATIVE mg/dL   Hgb urine dipstick TRACE (*) NEGATIVE   Bilirubin Urine NEGATIVE  NEGATIVE   Ketones, ur NEGATIVE  NEGATIVE mg/dL   Protein, ur NEGATIVE  NEGATIVE mg/dL   Urobilinogen, UA 0.2  0.0 - 1.0 mg/dL   Nitrite NEGATIVE  NEGATIVE   Leukocytes, UA TRACE (*) NEGATIVE  URINE MICROSCOPIC-ADD ON   Collection Time    04/25/13  1:50 AM      Result Value Range   Squamous Epithelial / LPF RARE  RARE   WBC, UA 3-6  <3 WBC/hpf   RBC / HPF 0-2  <3 RBC/hpf  CULTURE, BLOOD (ROUTINE X 2)   Collection Time    04/25/13  3:00 AM      Result Value Range   Specimen Description BLOOD PORT A CATH     Special Requests BOTTLES DRAWN AEROBIC AND ANAEROBIC     Culture  Setup Time       Value: 04/25/2013 10:21     Performed at [facility]   Culture       Value: ESCHERICHIA COLI     Note: SUSCEPTIBILITIES PERFORMED ON PREVIOUS CULTURE WITHIN THE LAST 5 DAYS.     Note:  Gram Stain Report Called to,Read Back By and Verified With: EMILY CALDWELL ON 04/26/2013 AT 12:21A BY WILEJ     Performed at [facility]   Report Status 04/28/2013 FINAL    CBC WITH DIFFERENTIAL   Collection Time    04/25/13  3:00 AM      Result Value Range   WBC 10.9 (*) 4.0 - 10.5 K/uL   RBC 3.37 (*) 3.87 - 5.11 MIL/uL   Hemoglobin 9.8 (*) 12.0 - 15.0 g/dL   HCT 16.1 (*) 09.6 - 04.5 %   MCV 82.5  78.0 - 100.0 fL   MCH 29.1  26.0 - 34.0 pg   MCHC 35.3  30.0 - 36.0 g/dL   RDW 40.9  81.1 - 91.4 %   Platelets 219  150 - 400 K/uL   Neutrophils Relative %  83 (*) 43 - 77 %   Neutro Abs 9.1 (*) 1.7 - 7.7 K/uL   Lymphocytes Relative 8 (*) 12 - 46 %   Lymphs Abs 0.8  0.7 - 4.0 K/uL   Monocytes Relative 9  3 - 12 %   Monocytes Absolute 1.0  0.1 - 1.0 K/uL   Eosinophils Relative 0  0 - 5 %   Eosinophils Absolute 0.0  0.0 - 0.7 K/uL   Basophils Relative 0  0 - 1 %   Basophils Absolute 0.0  0.0 - 0.1 K/uL  COMPREHENSIVE METABOLIC PANEL   Collection Time    04/25/13  3:00 AM      Result Value Range   Sodium 137  135 - 145 mEq/L   Potassium 3.7  3.5 - 5.1 mEq/L   Chloride 105  96 - 112 mEq/L   CO2 19  19 - 32 mEq/L   Glucose, Bld 66 (*) 70 - 99 mg/dL   BUN 8  6 - 23 mg/dL   Creatinine, Ser 7.82  0.50 - 1.10 mg/dL   Calcium 9.1  8.4 - 95.6 mg/dL   Total Protein 5.8 (*) 6.0 - 8.3 g/dL   Albumin 2.8 (*) 3.5 - 5.2 g/dL   AST 15  0 - 37 U/L   ALT 14  0 - 35 U/L   Alkaline Phosphatase 72  39 - 117 U/L   Total Bilirubin 1.1  0.3 - 1.2 mg/dL   GFR calc non Af Amer >90  >90 mL/min   GFR calc Af Amer >90  >90 mL/min  LACTIC ACID, PLASMA   Collection Time    04/25/13  3:00 AM      Result Value Range   Lactic Acid, Venous 1.3  0.5 - 2.2 mmol/L  GLUCOSE, CAPILLARY   Collection Time    04/25/13  5:24 AM      Result Value Range   Glucose-Capillary 112 (*) 70 - 99 mg/dL   Comment 1 Notify RN    CULTURE, BLOOD (ROUTINE X 2)   Collection Time    04/25/13  6:00 AM      Result Value Range   Specimen Description BLOOD  LEFT BAC     Special Requests Immunocompromised  10 ML BAA     Culture  Setup Time 04/25/2013 10:22     Culture       Value: ESCHERICHIA COLI     Note: Gram Stain Report Called to,Read Back By and Verified With: EMILY CALDWELL ON 04/26/2013 AT 12:21A BY WILEJ   Report Status 04/27/2013 FINAL     Organism ID, Bacteria ESCHERICHIA COLI    GLUCOSE, CAPILLARY  Collection Time    04/25/13  7:58 AM      Result Value Range   Glucose-Capillary 111 (*) 70 - 99 mg/dL  D-DIMER, QUANTITATIVE   Collection Time    04/25/13  9:05 AM       Result Value Range   D-Dimer, Quant 1.09 (*) 0.00 - 0.48 ug/mL-FEU  GLUCOSE, CAPILLARY   Collection Time    04/25/13  2:43 PM      Result Value Range   Glucose-Capillary 119 (*) 70 - 99 mg/dL  URINE CULTURE   Collection Time    04/25/13  2:50 PM      Result Value Range   Specimen Description URINE, CLEAN CATCH     Special Requests NONE     Culture  Setup Time       Value: 04/25/2013 22:45     Performed at [facility] Count       Value: 75,000 COLONIES/ML     Performed at [facility]   Culture       Value: DIPHTHEROIDS(CORYNEBACTERIUM SPECIES)     Note: Standardized susceptibility testing for this organism is not available.     Performed at [facility]   Report Status 04/27/2013 FINAL    GLUCOSE, CAPILLARY   Collection Time    04/25/13  8:54 PM      Result Value Range   Glucose-Capillary 110 (*) 70 - 99 mg/dL   Comment 1 Notify RN    GLUCOSE, CAPILLARY   Collection Time    04/25/13 10:30 PM      Result Value Range   Glucose-Capillary 153 (*) 70 - 99 mg/dL   Comment 1 Notify RN    CBC   Collection Time    04/26/13  5:30 AM      Result Value Range   WBC 7.0  4.0 - 10.5 K/uL   RBC 2.98 (*) 3.87 - 5.11 MIL/uL   Hemoglobin 8.7 (*) 12.0 - 15.0 g/dL   HCT 16.1 (*) 09.6 - 04.5 %   MCV 83.6  78.0 - 100.0 fL   MCH 29.2  26.0 - 34.0 pg   MCHC 34.9  30.0 - 36.0 g/dL   RDW 40.9  81.1 - 91.4 %   Platelets 185  150 - 400 K/uL  GLUCOSE, CAPILLARY   Collection Time    04/26/13  9:54 AM      Result Value Range   Glucose-Capillary 126 (*) 70 - 99 mg/dL  GLUCOSE, CAPILLARY   Collection Time    04/26/13  5:02 PM      Result Value Range   Glucose-Capillary 58 (*) 70 - 99 mg/dL  GLUCOSE, CAPILLARY   Collection Time    04/26/13  9:38 PM      Result Value Range   Glucose-Capillary 150 (*) 70 - 99 mg/dL  GLUCOSE, CAPILLARY   Collection Time    04/27/13 10:11 AM      Result Value Range   Glucose-Capillary 139 (*) 70 - 99 mg/dL  CBC    Collection Time    04/27/13 10:19 AM      Result Value Range   WBC 8.0  4.0 - 10.5 K/uL   RBC 3.27 (*) 3.87 - 5.11 MIL/uL   Hemoglobin 9.6 (*) 12.0 - 15.0 g/dL   HCT 78.2 (*) 95.6 - 21.3 %   MCV 82.9  78.0 - 100.0 fL   MCH 29.4  26.0 - 34.0 pg   MCHC 35.4  30.0 -  36.0 g/dL   RDW 96.0  45.4 - 09.8 %   Platelets 212  150 - 400 K/uL  CULTURE, BLOOD (ROUTINE X 2)   Collection Time    04/27/13  1:45 PM      Result Value Range   Specimen Description BLOOD RIGHT ARM     Special Requests BOTTLES DRAWN AEROBIC AND ANAEROBIC 5.5CC     Culture  Setup Time       Value: 04/27/2013 16:12     Performed at [facility]   Culture       Value:        BLOOD CULTURE RECEIVED NO GROWTH TO DATE CULTURE WILL BE HELD FOR 5 DAYS BEFORE ISSUING A FINAL NEGATIVE REPORT     Performed at [facility]   Report Status PENDING    CULTURE, BLOOD (ROUTINE X 2)   Collection Time    04/27/13  1:53 PM      Result Value Range   Specimen Description BLOOD RIGHT ARM     Special Requests BOTTLES DRAWN AEROBIC AND ANAEROBIC 5.5CC     Culture  Setup Time       Value: 04/27/2013 16:12     Performed at [facility]   Culture       Value:        BLOOD CULTURE RECEIVED NO GROWTH TO DATE CULTURE WILL BE HELD FOR 5 DAYS BEFORE ISSUING A FINAL NEGATIVE REPORT     Performed at [facility]   Report Status PENDING    GLUCOSE, CAPILLARY   Collection Time    04/27/13  3:49 PM      Result Value Range   Glucose-Capillary 76  70 - 99 mg/dL  GLUCOSE, CAPILLARY   Collection Time    04/27/13  9:07 PM      Result Value Range   Glucose-Capillary 120 (*) 70 - 99 mg/dL  GLUCOSE, CAPILLARY   Collection Time    04/27/13 10:46 PM      Result Value Range   Glucose-Capillary 124 (*) 70 - 99 mg/dL  CBC   Collection Time    04/28/13  5:30 AM      Result Value Range   WBC 7.2  4.0 - 10.5 K/uL   RBC 3.20 (*) 3.87 - 5.11 MIL/uL   Hemoglobin 9.4 (*) 12.0 - 15.0 g/dL   HCT 11.9 (*) 14.7 - 82.9 %    MCV 82.8  78.0 - 100.0 fL   MCH 29.4  26.0 - 34.0 pg   MCHC 35.5  30.0 - 36.0 g/dL   RDW 56.2  13.0 - 86.5 %   Platelets 216  150 - 400 K/uL  GLUCOSE, CAPILLARY   Collection Time    04/28/13  9:53 AM      Result Value Range   Glucose-Capillary 87  70 - 99 mg/dL  GLUCOSE, CAPILLARY   Collection Time    04/28/13  4:15 PM      Result Value Range   Glucose-Capillary 71  70 - 99 mg/dL  GLUCOSE, CAPILLARY   Collection Time    04/28/13  8:51 PM      Result Value Range   Glucose-Capillary 126 (*) 70 - 99 mg/dL   Comment 1 Notify RN    CBC   Collection Time    04/29/13  5:45 AM      Result Value Range   WBC 8.4  4.0 - 10.5 K/uL   RBC 3.20 (*) 3.87 - 5.11  MIL/uL   Hemoglobin 9.4 (*) 12.0 - 15.0 g/dL   HCT 16.1 (*) 09.6 - 04.5 %   MCV 82.8  78.0 - 100.0 fL   MCH 29.4  26.0 - 34.0 pg   MCHC 35.5  30.0 - 36.0 g/dL   RDW 40.9  81.1 - 91.4 %   Platelets 224  150 - 400 K/uL  GLUCOSE, CAPILLARY   Collection Time    04/29/13  8:13 AM      Result Value Range   Glucose-Capillary 91  70 - 99 mg/dL  GLUCOSE, CAPILLARY   Collection Time    04/29/13  4:09 PM      Result Value Range   Glucose-Capillary 89  70 - 99 mg/dL  GLUCOSE, CAPILLARY   Collection Time    04/29/13  8:24 PM      Result Value Range   Glucose-Capillary 109 (*) 70 - 99 mg/dL  CBC   Collection Time    04/30/13  4:55 AM      Result Value Range   WBC 9.3  4.0 - 10.5 K/uL   RBC 3.29 (*) 3.87 - 5.11 MIL/uL   Hemoglobin 9.7 (*) 12.0 - 15.0 g/dL   HCT 78.2 (*) 95.6 - 21.3 %   MCV 84.2  78.0 - 100.0 fL   MCH 29.5  26.0 - 34.0 pg   MCHC 35.0  30.0 - 36.0 g/dL   RDW 08.6  57.8 - 46.9 %   Platelets 246  150 - 400 K/uL  GLUCOSE, CAPILLARY   Collection Time    04/30/13  9:24 AM      Result Value Range   Glucose-Capillary 95  70 - 99 mg/dL  GLUCOSE, CAPILLARY   Collection Time    04/30/13  3:29 PM      Result Value Range   Glucose-Capillary 78  70 - 99 mg/dL  GLUCOSE, CAPILLARY   Collection Time    04/30/13  5:27  PM      Result Value Range   Glucose-Capillary 122 (*) 70 - 99 mg/dL   Comment 1 Notify RN     Comment 2 Documented in Chart    GLUCOSE, CAPILLARY   Collection Time    04/30/13  8:41 PM      Result Value Range   Glucose-Capillary 111 (*) 70 - 99 mg/dL  CBC   Collection Time    05/01/13  5:08 AM      Result Value Range   WBC 9.2  4.0 - 10.5 K/uL   RBC 3.46 (*) 3.87 - 5.11 MIL/uL   Hemoglobin 10.0 (*) 12.0 - 15.0 g/dL   HCT 62.9 (*) 52.8 - 41.3 %   MCV 83.2  78.0 - 100.0 fL   MCH 28.9  26.0 - 34.0 pg   MCHC 34.7  30.0 - 36.0 g/dL   RDW 24.4  01.0 - 27.2 %   Platelets 239  150 - 400 K/uL  GLUCOSE, CAPILLARY   Collection Time    05/01/13  8:57 AM      Result Value Range   Glucose-Capillary 87  70 - 99 mg/dL  GLUCOSE, CAPILLARY   Collection Time    05/01/13  1:50 PM      Result Value Range   Glucose-Capillary 116 (*) 70 - 99 mg/dL   Comment 1 Notify RN     US Ob Follow Up 04/18/2013   OBSTETRICAL ULTRASOUND: This exam was performed within a [facility]. The OB US report was generated  in the AS system, and faxed to the ordering physician.   This report is also available in [employer] and in the [employer]. See [facility] Obstetric US report.  US Renal 04/25/2013   *RADIOLOGY REPORT*  Clinical Data: Pain.  RENAL/URINARY TRACT ULTRASOUND COMPLETE  Comparison:  CT 01/17/2012  Findings:  Right Kidney:  12.4 cm. Normal size and echotexture.  No focal abnormality.  No hydronephrosis.  Left Kidney:  12.2 cm. Normal size and echotexture.  No focal abnormality.  No hydronephrosis.  Bladder:  Normal appearance without significant postvoid residual.  IMPRESSION: Unremarkable study.   Original Report Authenticated By: Charlett Nose, M.D.   Dg Chest Port 1 View 04/25/2013   *RADIOLOGY REPORT*  Clinical Data: Pregnant patient.  Tachypnea.  PORTABLE CHEST - 1 VIEW  Comparison: PA and lateral chest 01/19/2012.  Findings: Lungs are clear.  Heart size is upper normal.   No pneumothorax or pleural fluid.  Right IJ approach Port-A-Cath is noted.  No pneumothorax or pleural effusion.  IMPRESSION: No acute disease.   Original Report Authenticated By: Holley Dexter, M.D.   Dg Chest Port 1 View 05/01/2013   *RADIOLOGY REPORT*  Clinical Data: Confirm line placement.  Pregnant, shielded.  PORTABLE CHEST - 1 VIEW  Comparison: 04/25/2013  Findings: The patient has a left-sided PICC line, tip overlying the level of the superior vena cava.  Cardiac size is accentuated by the AP position of the patient and is probably within normal limits accounting for pregnancy.  There are no focal consolidations or pleural effusions.  No pulmonary edema.  No pneumothorax.  IMPRESSION: Interval placement of left-sided PICC line, tip overlying the superior vena cava.   Original Report Authenticated By: Norva Pavlov, M.D.   Discharge Exam: BP 118/71  Pulse 91  Temp(Src) 98.2 F (36.8 C) (Oral)  Resp 18  Ht 5\' 2"  (1.575 m)  Wt 292 lb 4.8 oz (132.586 kg)  BMI 53.45 kg/m2  SpO2 100%  LMP 12/26/2012 General appearance: alert and no distress Resp: clear to auscultation bilaterally Cardio: regular rate and rhythm GI: soft, gravid, non-tender; bowel sounds normal; no masses,  no organomegaly Extremities: extremities normal, atraumatic, no cyanosis or edema and Homans sign is negative, no sign of DVT  Discharge Condition: Stable  Disposition: 06-Home with Home Health Services  Discharge Orders   Future Appointments Provider Department Dept Phone   05/08/2013 11:00 AM Tawana Scale, MD [facility] [phone]   05/11/2013 9:00 AM Judyann Munson, MD [facility] for Infectious Disease [phone]   05/15/2013 9:15 AM Deirdre Odelia Gage [facility] [phone]   05/30/2013 9:30 AM [facility]TR[facility] [phone]   07/24/2013 1:30 PM [facility] [phone]    Future Orders Complete By Expires     Face-to-face encounter (required for Medicare/Medicaid patients)  As directed     Scheduling Instructions:      Ceftriaxone 1g IV q12 hours x 10 days. Routine PICC line maintenance and care    Comments:      I Florinda Taflinger A certify that this patient is under my care and that I, or a nurse practitioner or physician's assistant working with me, had a face-to-face encounter that meets the physician face-to-face encounter requirements with this patient on 05/01/2013. The encounter with the patient was in whole, or in part for the following medical condition(s) which is the primary reason for home health care (List medical  condition): E.coli bacteremia from port-a-cath infection, [redacted] weeks pregnant, remission from leukemia    Questions:      The encounter with the patient was in whole, or in part, for the following medical condition, which is the primary reason for home health care:  E.coli bacteremia from port-a-cath infection, [redacted] weeks pregnant, remission from leukemia    I certify that, based on my findings, the following services are medically necessary home health services:  Nursing    Nursing    My clinical findings support the need for the above services:  OTHER SEE COMMENTS    Further, I certify that my clinical findings support that this patient is homebound due to:  Immunocompromised    Shortness of Breath with activity    Reason for Medically Necessary Home Health Services:  Skilled Nursing- Assessment and Training for Infusion Therapy, Line Care, and Infection Control    Skilled Nursing- Administration and Training of Injectable Medication    Home Health  As directed     Scheduling Instructions:      Administration of IV Ceftriaxone 1g every 12 hours via PICC line until 05/10/13    Questions:      To provide the following care/treatments:  RN        Medication List         ACCU-CHEK FASTCLIX LANCETS Misc  Inject 1 Device into the skin 4 (four)  times daily.     dextrose 5 % SOLN 50 mL with cefTRIAXone 1 G SOLR 1 g  Inject 1 g into the vein every 12 (twelve) hours. For 10 days     glucose blood test strip  Commonly known as:  ACCU-CHEK SMARTVIEW  1 each by Other route 4 (four) times daily. Check blood sugar four times daily.     insulin aspart 100 UNIT/ML injection  Commonly known as:  novoLOG  Inject 20 Units into the skin 3 (three) times daily with meals.     insulin NPH 100 UNIT/ML injection  Commonly known as:  HUMULIN N,NOVOLIN N  Inject 15 Units into the skin 2 (two) times daily.     insulin NPH 100 UNIT/ML injection  Commonly known as:  HUMULIN N  Inject 17 Units into the skin at bedtime.     Insulin Syringe-Needle U-100 26G X 1/2" 1 ML Misc  35 Units by Does not apply route AC breakfast.     labetalol 100 MG tablet  Commonly known as:  NORMODYNE  Take 1 tablet (100 mg total) by mouth 2 (two) times daily.     multivitamin-prenatal 27-0.8 MG Tabs tablet  Take 1 tablet by mouth daily at 12 noon.     oxyCODONE-acetaminophen 5-325 MG per tablet  Commonly known as:  PERCOCET/ROXICET  Take 1-2 tablets by mouth every 6 (six) hours as needed.     terconazole 0.4 % vaginal cream  Commonly known as:  TERAZOL 7  Place 1 applicator vaginally at bedtime.           Follow-up Information   Follow up with [facility] On 05/08/2013. (11 am with Dr. Hinda Lenis.)    Contact information:   [address] [place] [address] [phone]      Signed: Tereso Newcomer M.D. 05/01/2013, 8:51 PM

## 2013-05-01 NOTE — Progress Notes (Signed)
Patient ID: Melissa Washington, female   DOB: 1986/02/11, 27 y.o.   MRN: [id number] ACULT[facility] PRACTICE ANTEPARTUM COMPREHENSIVE PROGRESS NOTE  Melissa Washington is a 27 y.o. G2P1001 at [redacted]w[redacted]d  who is admitted for line sepsis.    Length of Stay:  6  Days  Subjective: Pt without complaints today. No chills, no headaches. Feeling well.  She continues to report occ mild cramping of lower abd 1-2x/day. Patient reports good fetal movement. Denies ctx, lof, vb,   Vitals:  Blood pressure 118/71, pulse 91, temperature 98.2 F (36.8 C), temperature source Oral, resp. rate 18, height 5\' 2"  (1.575 m), weight 132.586 kg (292 lb 4.8 oz), last menstrual period 12/26/2012, SpO2 100.00%. Filed Vitals:   04/30/13 1530 04/30/13 1731 04/30/13 2232 05/01/13 0543  BP: 116/63 107/75 106/53 118/71  Pulse: 80 86 91 91  Temp: 98.3 F (36.8 C) 99 F (37.2 C) 98.3 F (36.8 C) 98.2 F (36.8 C)  TempSrc: Oral Oral Oral Oral  Resp: 19 20 18 18   Height:      Weight:    132.586 kg (292 lb 4.8 oz)  SpO2: 100% 98% 100% 100%     Physical Examination: General appearance - alert, well appearing, and in no distress CTAB no w/r/c RRR no m/g/t Abdomen - soft, nontender, nondistended, no masses or organomegaly   Labs:  Results for orders placed during the hospital encounter of 04/25/13 (from the past 24 hour(s))  GLUCOSE, CAPILLARY   Collection Time    04/30/13  9:24 AM      Result Value Range   Glucose-Capillary 95  70 - 99 mg/dL  GLUCOSE, CAPILLARY   Collection Time    04/30/13  3:29 PM      Result Value Range   Glucose-Capillary 78  70 - 99 mg/dL  GLUCOSE, CAPILLARY   Collection Time    04/30/13  5:27 PM      Result Value Range   Glucose-Capillary 122 (*) 70 - 99 mg/dL   Comment 1 Notify RN     Comment 2 Documented in Chart    GLUCOSE, CAPILLARY   Collection Time    04/30/13  8:41 PM      Result Value Range   Glucose-Capillary 111 (*) 70 - 99 mg/dL  CBC   Collection Time    05/01/13  5:08 AM   Result Value Range   WBC 9.2  4.0 - 10.5 K/uL   RBC 3.46 (*) 3.87 - 5.11 MIL/uL   Hemoglobin 10.0 (*) 12.0 - 15.0 g/dL   HCT 40.9 (*) 81.1 - 91.4 %   MCV 83.2  78.0 - 100.0 fL   MCH 28.9  26.0 - 34.0 pg   MCHC 34.7  30.0 - 36.0 g/dL   RDW 78.2  95.6 - 21.3 %   Platelets 239  150 - 400 K/uL    Imaging Studies:    none   Medications:  Scheduled . cefTRIAXone (ROCEPHIN)  IV  1 g Intravenous Q12H  . clotrimazole  1 Applicatorful Vaginal QHS  . docusate sodium  100 mg Oral Daily  . insulin aspart  0-15 Units Subcutaneous TID WC  . insulin aspart  10 Units Subcutaneous TID WC  . insulin NPH  10 Units Subcutaneous BID AC & HS  . pantoprazole  40 mg Oral BID  . prenatal multivitamin  1 tablet Oral Q1200   I have reviewed the patient's current medications.  ASSESSMENT: Patient Active Problem List   Diagnosis Date Noted  .  Diabetes mellitus, antepartum 02/20/2013  . Benign essential hypertension antepartum 02/20/2013  . Obesity complicating peripregnancy, antepartum 02/20/2013  . Supervision of high-risk pregnancy 01/19/2013  . 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 Morbid obesity with BMI of 50.0-59.9, adult 01/20/2012  . DM (diabetes mellitus), type 2, uncontrolled 12/30/2006  . HYPERTENSION, BENIGN ESSENTIAL 12/30/2006    PLAN: Pt has been afebrile for >72 hours.  She is scheduled for PICC line placement today with plan for IV atbx at home to complete 14days  (end day 20Aug) - Coordinate ABx today Continue current DM regimen. Good control. Continue routine antenatal care.  Melissa Washington, RYAN 05/01/2013,8:01 AM

## 2013-05-01 NOTE — Progress Notes (Signed)
[facility]  Patient Status:   New pt for [facility] services this admission  [facility] is providing the following services:   Pt will receive HH RN at home and [facility] for home IV antibiotics.  [facility] hospital infusion coordinator provided in hospital pre discharge teaching of IV Rocephin administration with pt and caregiver to support independence at home upon DC. [facility] RN to home a.m. Of 8/12/1 for additional teaching/assessment.  If patient discharges after hours, please call [phone].   Sedalia Muta 05/01/2013, 12:07 PM

## 2013-05-01 NOTE — Care Management Note (Signed)
    Page 1 of 1   05/01/2013     2:14:05 PM   CARE MANAGEMENT NOTE 05/01/2013  Patient:  Melissa Washington, Melissa Washington   Account Number:  [id number]  Date Initiated:  05/01/2013  Documentation initiated by:  CRAFT,TERRI  Subjective/Objective Assessment:   27 year old female admitted 04/25/13 at 20 weeks with N/V, and chills     Action/Plan:   D/C when medically stable   Anticipated DC Date:  05/01/2013   Anticipated DC Plan:  HOME W HOME HEALTH SERVICES      DC Planning Services  CM consult      [facility] Choice  HOME HEALTH   Choice offered to / List presented to:  C-1 Patient        HH arranged  HH-1 RN  IV Antibiotics      HH agency  [facility]   Status of service:  Completed, signed off  Discharge Disposition:  HOME W HOME HEALTH SERVICES  Per UR Regulation:  Reviewed for med. necessity/level of care/duration of stay  Comments:  05/01/13, Kathi Der RNC-MNN, BSN, [phone], CM received referral and met with pt to offer choice for [facility] services. Pt chose [facility].  Kristen at [facility] conatcted with order information and confirmation received.

## 2013-05-03 ENCOUNTER — Telehealth: Payer: Self-pay | Admitting: *Deleted

## 2013-05-03 LAB — CULTURE, BLOOD (ROUTINE X 2)
Culture: NO GROWTH
Culture: NO GROWTH

## 2013-05-03 NOTE — Telephone Encounter (Signed)
Per Dr Luciana Axe called [facility] and advised them to D/C the patient PICC on 05/10/13 even though she is due to see [place] 05/25/13.

## 2013-05-03 NOTE — H&P (Signed)
Attestation of Attending Supervision of Advanced Practitioner (CNM/NP): Evaluation and management procedures were performed by the Advanced Practitioner under my supervision and collaboration.  I have reviewed the Advanced Practitioner's note and chart, and I agree with the management and plan.  Fritzie Prioleau 05/03/2013 8:40 AM   

## 2013-05-08 ENCOUNTER — Encounter: Payer: Medicaid Other | Admitting: Family Medicine

## 2013-05-08 ENCOUNTER — Telehealth: Payer: Self-pay | Admitting: Family Medicine

## 2013-05-08 NOTE — Telephone Encounter (Signed)
Patient missed appointment 05/08/2013. Called and left message to call clinic.

## 2013-05-09 ENCOUNTER — Inpatient Hospital Stay: Payer: Medicaid Other | Admitting: Internal Medicine

## 2013-05-11 ENCOUNTER — Inpatient Hospital Stay: Payer: Medicaid Other | Admitting: Internal Medicine

## 2013-05-15 ENCOUNTER — Ambulatory Visit (INDEPENDENT_AMBULATORY_CARE_PROVIDER_SITE_OTHER): Payer: Medicaid Other | Admitting: Obstetrics and Gynecology

## 2013-05-15 ENCOUNTER — Encounter: Payer: Self-pay | Admitting: Obstetrics and Gynecology

## 2013-05-15 ENCOUNTER — Encounter: Payer: Medicaid Other | Attending: Family Medicine | Admitting: *Deleted

## 2013-05-15 VITALS — BP 126/83 | Temp 98.4°F | Wt 287.9 lb

## 2013-05-15 DIAGNOSIS — Z713 Dietary counseling and surveillance: Secondary | ICD-10-CM | POA: Insufficient documentation

## 2013-05-15 DIAGNOSIS — E119 Type 2 diabetes mellitus without complications: Secondary | ICD-10-CM | POA: Insufficient documentation

## 2013-05-15 DIAGNOSIS — E1169 Type 2 diabetes mellitus with other specified complication: Secondary | ICD-10-CM

## 2013-05-15 DIAGNOSIS — O24919 Unspecified diabetes mellitus in pregnancy, unspecified trimester: Secondary | ICD-10-CM

## 2013-05-15 DIAGNOSIS — E669 Obesity, unspecified: Secondary | ICD-10-CM

## 2013-05-15 DIAGNOSIS — I10 Essential (primary) hypertension: Secondary | ICD-10-CM | POA: Insufficient documentation

## 2013-05-15 LAB — POCT URINALYSIS DIP (DEVICE)
Bilirubin Urine: NEGATIVE
Glucose, UA: NEGATIVE mg/dL
Hgb urine dipstick: NEGATIVE
Leukocytes, UA: NEGATIVE
Nitrite: NEGATIVE
Urobilinogen, UA: 0.2 mg/dL (ref 0.0–1.0)
pH: 6 (ref 5.0–8.0)

## 2013-05-15 NOTE — Progress Notes (Signed)
Discharged from hospital last week (PAC infection and PICC line removed) and has not checked any CBGs. On insulin as dir and labetalol. Has fetal echo scheduled. Will get 24 hr urine. See Lenor Coffin. [place] with MFM 05/25/13. RLP discussed.

## 2013-05-15 NOTE — Progress Notes (Signed)
Pulse- 88 Patient reports pain lower back and pelvic pressure

## 2013-05-15 NOTE — Patient Instructions (Addendum)
Diabetes Meal Planning Guide The diabetes meal planning guide is a tool to help you plan your meals and snacks. It is important for people with diabetes to manage their blood glucose (sugar) levels. Choosing the right foods and the right amounts throughout your day will help control your blood glucose. Eating right can even help you improve your blood pressure and reach or maintain a healthy weight. CARBOHYDRATE COUNTING MADE EASY When you eat carbohydrates, they turn to sugar. This raises your blood glucose level. Counting carbohydrates can help you control this level so you feel better. When you plan your meals by counting carbohydrates, you can have more flexibility in what you eat and balance your medicine with your food intake. Carbohydrate counting simply means adding up the total amount of carbohydrate grams in your meals and snacks. Try to eat about the same amount at each meal. Foods with carbohydrates are listed below. Each portion below is 1 carbohydrate serving or 15 grams of carbohydrates. Ask your dietician how many grams of carbohydrates you should eat at each meal or snack. Grains and Starches  1 slice bread.   English muffin or hotdog/hamburger bun.   cup cold cereal (unsweetened).   cup cooked pasta or rice.   cup starchy vegetables (corn, potatoes, peas, beans, winter squash).  1 tortilla (6 inches).   bagel.  1 waffle or pancake (size of a CD).   cup cooked cereal.  4 to 6 small crackers. *Whole grain is recommended. Fruit  1 cup fresh unsweetened berries, melon, papaya, pineapple.  1 small fresh fruit.   banana or mango.   cup fruit juice (4 oz unsweetened).   cup canned fruit in natural juice or water.  2 tbs dried fruit.  12 to 15 grapes or cherries. Milk and Yogurt  1 cup fat-free or 1% milk.  1 cup soy milk.  6 oz light yogurt with sugar-free sweetener.  6 oz low-fat soy yogurt.  6 oz plain yogurt. Vegetables  1 cup raw or  cup  cooked is counted as 0 carbohydrates or a "free" food.  If you eat 3 or more servings at 1 meal, count them as 1 carbohydrate serving. Other Carbohydrates   oz chips or pretzels.   cup ice cream or frozen yogurt.   cup sherbet or sorbet.  2 inch square cake, no frosting.  1 tbs honey, sugar, jam, jelly, or syrup.  2 small cookies.  3 squares of graham crackers.  3 cups popcorn.  6 crackers.  1 cup broth-based soup.  Count 1 cup casserole or other mixed foods as 2 carbohydrate servings.  Foods with less than 20 calories in a serving may be counted as 0 carbohydrates or a "free" food. You may want to purchase a book or computer software that lists the carbohydrate gram counts of different foods. In addition, the nutrition facts panel on the labels of the foods you eat are a good source of this information. The label will tell you how big the serving size is and the total number of carbohydrate grams you will be eating per serving. Divide this number by 15 to obtain the number of carbohydrate servings in a portion. Remember, 1 carbohydrate serving equals 15 grams of carbohydrate. SERVING SIZES Measuring foods and serving sizes helps you make sure you are getting the right amount of food. The list below tells how big or small some common serving sizes are.  1 oz.........4 stacked dice.  3 oz.........Deck of cards.  1 tsp........Tip   of little finger.  1 tbs........Thumb.  2 tbs........Golf ball.   cup.......Half of a fist.  1 cup........A fist. SAMPLE DIABETES MEAL PLAN Below is a sample meal plan that includes foods from the grain and starches, dairy, vegetable, fruit, and meat groups. A dietician can individualize a meal plan to fit your calorie needs and tell you the number of servings needed from each food group. However, controlling the total amount of carbohydrates in your meal or snack is more important than making sure you include all of the food groups at every  meal. You may interchange carbohydrate containing foods (dairy, starches, and fruits). The meal plan below is an example of a 2000 calorie diet using carbohydrate counting. This meal plan has 17 carbohydrate servings. Breakfast  1 cup oatmeal (2 carb servings).   cup light yogurt (1 carb serving).  1 cup blueberries (1 carb serving).   cup almonds. Snack  1 large apple (2 carb servings).  1 low-fat string cheese stick. Lunch  Chicken breast salad.  1 cup spinach.   cup chopped tomatoes.  2 oz chicken breast, sliced.  2 tbs low-fat Italian dressing.  12 whole-wheat crackers (2 carb servings).  12 to 15 grapes (1 carb serving).  1 cup low-fat milk (1 carb serving). Snack  1 cup carrots.   cup hummus (1 carb serving). Dinner  3 oz broiled salmon.  1 cup brown rice (3 carb servings). Snack  1  cups steamed broccoli (1 carb serving) drizzled with 1 tsp olive oil and lemon juice.  1 cup light pudding (2 carb servings). DIABETES MEAL PLANNING WORKSHEET Your dietician can use this worksheet to help you decide how many servings of foods and what types of foods are right for you.  BREAKFAST Food Group and Servings / Carb Servings Grain/Starches __________________________________ Dairy __________________________________________ Vegetable ______________________________________ Fruit ___________________________________________ Meat __________________________________________ Fat ____________________________________________ LUNCH Food Group and Servings / Carb Servings Grain/Starches ___________________________________ Dairy ___________________________________________ Fruit ____________________________________________ Meat ___________________________________________ Fat _____________________________________________ DINNER Food Group and Servings / Carb Servings Grain/Starches ___________________________________ Dairy  ___________________________________________ Fruit ____________________________________________ Meat ___________________________________________ Fat _____________________________________________ SNACKS Food Group and Servings / Carb Servings Grain/Starches ___________________________________ Dairy ___________________________________________ Vegetable _______________________________________ Fruit ____________________________________________ Meat ___________________________________________ Fat _____________________________________________ DAILY TOTALS Starches _________________________ Vegetable ________________________ Fruit ____________________________ Dairy ____________________________ Meat ____________________________ Fat ______________________________ Document Released: 06/04/2005 Document Revised: 11/30/2011 Document Reviewed: 04/15/2009 ExitCare Patient Information 2014 [employer]. Round Ligament Pain The round ligament is made up of muscle and fibrous tissue. It is attached to the uterus near the fallopian tube. The round ligament is located on both sides of the uterus and helps support the position of the uterus. It usually begins in the second trimester of pregnancy when the uterus comes out of the pelvis. The pain can come and go until the baby is delivered. Round ligament pain is not a serious problem and does not cause harm to the baby. CAUSE During pregnancy the uterus grows the most from the second trimester to delivery. As it grows, it stretches and slightly twists the round ligaments. When the uterus leans from one side to the other, the round ligament on the opposite side pulls and stretches. This can cause pain. SYMPTOMS  Pain can occur on one side or both sides. The pain is usually a short, sharp, and pinching-like. Sometimes it can be a dull, lingering and aching pain. The pain is located in the lower side of the abdomen or in the groin. The pain is internal and  usually starts deep in the groin   and moves up to the outside of the hip area. Pain can occur with:  Sudden change in position like getting out of bed or a chair.  Rolling over in bed.  Coughing or sneezing.  Walking too much.  Any type of physical activity. DIAGNOSIS  Your caregiver will make sure there are no serious problems causing the pain. When nothing serious is found, the symptoms usually indicate that the pain is from the round ligament. TREATMENT   Sit down and relax when the pain starts.  Flex your knees up to your belly.  Lay on your side with a pillow under your belly (abdomen) and another one between your legs.  Sit in a hot bath for 15 to 20 minutes or until the pain goes away. HOME CARE INSTRUCTIONS   Only take over-the-counter or prescriptions medicines for pain, discomfort or fever as directed by your caregiver.  Sit and stand slowly.  Avoid long walks if it causes pain.  Stop or lessen your physical activities if it causes pain. SEEK MEDICAL CARE IF:   The pain does not go away with any of your treatment.  You need stronger medication for the pain.  You develop back pain that you did not have before with the side pain. SEEK IMMEDIATE MEDICAL CARE IF:   You develop a temperature of 102 F (38.9 C) or higher.  You develop uterine contractions.  You develop vaginal bleeding.  You develop nausea, vomiting or diarrhea.  You develop chills.  You have pain when you urinate. Document Released: 06/16/2008 Document Revised: 11/30/2011 Document Reviewed: 06/16/2008 ExitCare Patient Information 2014 [employer].  

## 2013-05-15 NOTE — Progress Notes (Signed)
DIABETES EDUCATION: Patient recently discharged from hospital r/t PAC infection. Patient notes glucose control good while in hospital. Since discharge has been focused on out patient care of infection and PICC. PICC removed and patient now feels that she can focus on her DM management. Will try to test 4X daily and log. Noted that she does not have much of an appetite. Encouraged 5 small meals daily to include carb and protein. Patient verbalized understanding

## 2013-05-16 ENCOUNTER — Inpatient Hospital Stay: Payer: Medicaid Other | Admitting: Internal Medicine

## 2013-05-25 ENCOUNTER — Encounter: Payer: Self-pay | Admitting: Internal Medicine

## 2013-05-25 ENCOUNTER — Ambulatory Visit (INDEPENDENT_AMBULATORY_CARE_PROVIDER_SITE_OTHER): Payer: Medicaid Other | Admitting: Internal Medicine

## 2013-05-25 ENCOUNTER — Other Ambulatory Visit: Payer: Self-pay

## 2013-05-25 VITALS — BP 120/74 | HR 78 | Temp 98.2°F | Wt 288.0 lb

## 2013-05-25 DIAGNOSIS — R7881 Bacteremia: Secondary | ICD-10-CM

## 2013-05-25 DIAGNOSIS — A498 Other bacterial infections of unspecified site: Secondary | ICD-10-CM

## 2013-05-25 NOTE — Progress Notes (Signed)
[facility] CL[facility] NOTE  RFV: follow up from recent hospitalization for ecoli bacteremia Subjective:    Patient ID: Melissa Washington, female    DOB: [birth year], 27 y.o.   MRN: [id number]  HPI  Melissa Washington is a pleasant 27yo F with APML in remission who is now [redacted] wk gestation who recently was hospitalized for feeling poorly and found to have ecoli bacteremia, presumably urinary origin. She was discharged on 2 wks of IV antibiotics which finished on 05/10/13. Her picc line was removed ans she is feeling better and not having any recurrent symptoms that brought her to the hospital. Pregnancy is going well. She does subscribe to fatigue  Current Outpatient Prescriptions on File Prior to Visit  Medication Sig Dispense Refill  . insulin aspart (NOVOLOG) 100 UNIT/ML injection Inject 20 Units into the skin 3 (three) times daily with meals.  10 mL  12  . labetalol (NORMODYNE) 100 MG tablet Take 1 tablet (100 mg total) by mouth 2 (two) times daily.  60 tablet  2  . Prenatal Vit-Fe Fumarate-FA (MULTIVITAMIN-PRENATAL) 27-0.8 MG TABS Take 1 tablet by mouth daily at 12 noon.  30 each  3   No current facility-administered medications on file prior to visit.   Active Ambulatory Problems    Diagnosis Date Noted  . DM (diabetes mellitus), type 2, uncontrolled 12/30/2006  . HYPERTENSION, BENIGN ESSENTIAL 12/30/2006  . Morbid obesity with BMI of 50.0-59.9, adult 01/20/2012  . 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 Supervision of high-risk pregnancy 01/19/2013  . Diabetes mellitus, antepartum 02/20/2013  . Benign essential hypertension antepartum 02/20/2013  . Obesity complicating peripregnancy, antepartum 02/20/2013  . Bacteremia associated with IV line 05/01/2013  . Bacteremia due to Escherichia coli 05/01/2013   Resolved Ambulatory Problems    Diagnosis Date Noted  . ABSCESS, SKIN 04/03/2010  . ECZEMA, ATOPIC 05/20/2007  . IUD migration 12/04/2010  . Cyst, breast 12/04/2010  . UTI (lower urinary  tract infection) 10/12/2011  . Hematuria 10/12/2011  . Pyelonephritis 11/25/2011  . Pancytopenia 11/26/2011  . 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 Fever 01/17/2012  . Cholelithiasis 01/17/2012  . Hepatic steatosis 01/17/2012  . CAP (community acquired pneumonia) 01/19/2012  . Pleurisy with effusion 01/21/2012  . Nausea & vomiting 01/30/2012  . Abdominal pain 01/30/2012  . Gram-negative bacteremia 02/02/2012  . Sepsis due to Klebsiella 02/02/2012  . Back pain 03/11/2012  . Foul smelling vaginal discharge 05/20/2012  . Preventive measure 05/20/2012  . Hip pain, bilateral 08/30/2012  . Amenorrhea 10/25/2012  . Vaginitis 10/25/2012  . Pregnancy, subsequent 02/07/2013   Past Medical History  Diagnosis Date  . Diabetes mellitus   . Hypertension   . Nephrolithiasis   . Acute promyelocytic leukemia   . Headache(784.0)   . Leukemia, acute monocytic, in remission    History   Social History  . Marital Status: Single    Spouse Name: N/A    Number of Children: 1  . Years of Education: 13   Occupational History  . Unemployed   . Personal care aide in past    Social History Main Topics  . Smoking status: Never Smoker   . Smokeless tobacco: Never Used  . Alcohol Use: No  . Drug Use: No  . Sexual Activity: Yes    Birth Control/ Protection: None   Other Topics Concern  . Not on file   Social History Narrative   Single, lives with her daughter in [place].   family history includes Diabetes in her  father and maternal grandmother; Epilepsy in her mother; Hypertension in her mother; Kidney disease in her maternal grandmother and mother; Sleep apnea in her mother.   Review of Systems 12 point ROS is negative, other than fatigue    Objective:   Physical Exam BP 120/74  Pulse 78  Temp(Src) 98.2 F (36.8 C) (Oral)  Wt 288 lb (130.636 kg)  BMI 52.66 kg/m2  LMP 12/26/2012 Physical Exam  Constitutional:  oriented to person, place, and time. appears  well-developed and well-nourished. No distress.  HENT:  Mouth/Throat: Oropharynx is clear and moist. No oropharyngeal exudate.  Cardiovascular: Normal rate, regular rhythm and normal heart sounds. Exam reveals no gallop and no friction rub.No murmur heard.  Pulmonary/Chest: Effort normal and breath sounds normal. No respiratory distress. no wheezes.  Abdominal: Soft. Bowel sounds are normal. Protuberant abdomen c/w pregnancy Lymphadenopathy: no cervical adenopathy.  Neurological: alert and oriented to person, place, and time.  Skin: Skin is warm and dry. No rash noted. No erythema.  Psychiatric:  a normal mood and affect.  behavior is normal.      Assessment & Plan:  ecoli bacteremia = resolved and treated with appropriate antibiotics  rtc prn

## 2013-05-26 ENCOUNTER — Other Ambulatory Visit: Payer: Self-pay | Admitting: Obstetrics & Gynecology

## 2013-05-26 DIAGNOSIS — E119 Type 2 diabetes mellitus without complications: Secondary | ICD-10-CM

## 2013-05-29 ENCOUNTER — Encounter: Payer: Medicaid Other | Admitting: Obstetrics and Gynecology

## 2013-05-30 ENCOUNTER — Ambulatory Visit (HOSPITAL_COMMUNITY)
Admission: RE | Admit: 2013-05-30 | Discharge: 2013-05-30 | Disposition: A | Discharge status: Home / Self Care | Payer: Medicaid Other | Source: Ambulatory Visit | Attending: Obstetrics and Gynecology | Admitting: Obstetrics and Gynecology

## 2013-05-30 ENCOUNTER — Encounter (HOSPITAL_COMMUNITY): Payer: Self-pay

## 2013-05-30 VITALS — BP 109/64 | HR 89 | Wt 290.5 lb

## 2013-05-30 DIAGNOSIS — D573 Sickle-cell trait: Secondary | ICD-10-CM | POA: Insufficient documentation

## 2013-05-30 DIAGNOSIS — O0990 Supervision of high risk pregnancy, unspecified, unspecified trimester: Secondary | ICD-10-CM

## 2013-05-30 DIAGNOSIS — E119 Type 2 diabetes mellitus without complications: Secondary | ICD-10-CM

## 2013-05-30 DIAGNOSIS — O99019 Anemia complicating pregnancy, unspecified trimester: Secondary | ICD-10-CM | POA: Insufficient documentation

## 2013-05-30 DIAGNOSIS — O10019 Pre-existing essential hypertension complicating pregnancy, unspecified trimester: Secondary | ICD-10-CM | POA: Insufficient documentation

## 2013-05-30 DIAGNOSIS — E669 Obesity, unspecified: Secondary | ICD-10-CM | POA: Insufficient documentation

## 2013-05-30 DIAGNOSIS — O24919 Unspecified diabetes mellitus in pregnancy, unspecified trimester: Secondary | ICD-10-CM | POA: Insufficient documentation

## 2013-06-02 ENCOUNTER — Inpatient Hospital Stay (HOSPITAL_COMMUNITY)
Admission: AD | Admit: 2013-06-02 | Discharge: 2013-06-03 | Disposition: A | Discharge status: Home / Self Care | Payer: Medicaid Other | Source: Ambulatory Visit | Attending: Obstetrics & Gynecology | Admitting: Obstetrics & Gynecology

## 2013-06-02 ENCOUNTER — Encounter (HOSPITAL_COMMUNITY): Payer: Self-pay | Admitting: *Deleted

## 2013-06-02 DIAGNOSIS — O99891 Other specified diseases and conditions complicating pregnancy: Secondary | ICD-10-CM | POA: Insufficient documentation

## 2013-06-02 DIAGNOSIS — J069 Acute upper respiratory infection, unspecified: Secondary | ICD-10-CM | POA: Insufficient documentation

## 2013-06-02 DIAGNOSIS — O10019 Pre-existing essential hypertension complicating pregnancy, unspecified trimester: Secondary | ICD-10-CM | POA: Insufficient documentation

## 2013-06-02 DIAGNOSIS — R509 Fever, unspecified: Secondary | ICD-10-CM | POA: Insufficient documentation

## 2013-06-02 DIAGNOSIS — O99810 Abnormal glucose complicating pregnancy: Secondary | ICD-10-CM | POA: Insufficient documentation

## 2013-06-02 LAB — URINALYSIS, ROUTINE W REFLEX MICROSCOPIC
Bilirubin Urine: NEGATIVE
Hgb urine dipstick: NEGATIVE
Nitrite: NEGATIVE
Protein, ur: NEGATIVE mg/dL
Specific Gravity, Urine: 1.02 (ref 1.005–1.030)
Urobilinogen, UA: 0.2 mg/dL (ref 0.0–1.0)

## 2013-06-02 LAB — URINE MICROSCOPIC-ADD ON

## 2013-06-02 MED ORDER — PSEUDOEPHEDRINE HCL 60 MG PO TABS: 60.0000 mg | ORAL_TABLET | ORAL | Status: DC | PRN

## 2013-06-02 MED ORDER — ACETAMINOPHEN 500 MG PO TABS: 500.0000 mg | ORAL_TABLET | Freq: Four times a day (QID) | ORAL | Status: DC | PRN

## 2013-06-02 NOTE — MAU Provider Note (Signed)
History     CSN: [id number]  Arrival date and time: 06/02/13 2122   None     Chief Complaint  Patient presents with  . Fever  . Chills  . Fatigue  . Shortness of Breath   HPI Comments: Melissa Washington presents today after having a home temperature of 100.2 farenheit around 9:30PM last tonight. Associated symptoms were chills and shortness of breath which she is not currently experiencing. She has also had sneezing with clear discharge with an intermittent cough, producing no sputum and mild pain in throat. She reports no history of night sweats. No headaches, no abdominal pain, no dysuria. She has no sick contacts. No nausea/vomiting or constipation/diarrhea. She reports good fetal movement, no contractions, history of pelvic pressure, no vaginal bleeding, no LOF.   OB History   Grav Para Term Preterm Abortions TAB SAB Ect Mult Living   2 1 1  0 0 0 0 0 0 1      Past Medical History  Diagnosis Date  . Diabetes mellitus   . Hypertension   . Nephrolithiasis   . 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 Hepatic steatosis 01/17/2012  . Cholelithiasis 01/17/2012  . Headache(784.0)   . Leukemia, acute monocytic, in remission   . Bacteremia associated with IV line 05/01/2013    Port-a-cath infection, removed 8/6 by IR. Ecoli grew out in cultures. Sent home with Ceftriaxone to complete 14 day course.   . Bacteremia due to Escherichia coli 05/01/2013    Port-a-cath infection, removed 8/6 by IR. Ecoli grew out in cultures. Sent home with Ceftriaxone to complete 14 day course.     Past Surgical History  Procedure Laterality Date  . Portacath placement      [facility]  . Cholecystectomy  01/31/2012    Procedure: LAPAROSCOPIC CHOLECYSTECTOMY WITH INTRAOPERATIVE CHOLANGIOGRAM;  Surgeon: Clovis Pu. Cornett, MD;  Location: [facility] OR;  Service: General;  Laterality: N/A;    Family History  Problem Relation Age of Onset  . Kidney disease Mother   . Hypertension Mother   . Epilepsy  Mother   . Sleep apnea Mother   . Kidney disease Maternal Grandmother   . Diabetes Maternal Grandmother   . Diabetes Father     History  Substance Use Topics  . Smoking status: Never Smoker   . Smokeless tobacco: Never Used  . Alcohol Use: No    Allergies:  Allergies  Allergen Reactions  . Ultram [Tramadol Hcl] Nausea And Vomiting    Prescriptions prior to admission  Medication Sig Dispense Refill  . insulin aspart (NOVOLOG) 100 UNIT/ML injection Inject 20 Units into the skin 3 (three) times daily with meals.  10 mL  12  . insulin NPH (HUMULIN N,NOVOLIN N) 100 UNIT/ML injection Inject 15-17 Units into the skin 2 (two) times daily. Inject 15 units in the morning and 17 units at night.      . labetalol (NORMODYNE) 100 MG tablet Take 1 tablet (100 mg total) by mouth 2 (two) times daily.  60 tablet  2  . oxyCODONE-acetaminophen (PERCOCET/ROXICET) 5-325 MG per tablet Take 1-2 tablets by mouth every 6 (six) hours as needed for pain.      . Prenatal Vit-Fe Fumarate-FA (MULTIVITAMIN-PRENATAL) 27-0.8 MG TABS Take 1 tablet by mouth daily at 12 noon.  30 each  3  . [DISCONTINUED] insulin NPH (HUMULIN N) 100 UNIT/ML injection Inject 17 Units into the skin at bedtime.  1 vial  12  . [DISCONTINUED] oxyCODONE-acetaminophen (PERCOCET/ROXICET)  5-325 MG per tablet Take 1-2 tablets by mouth every 6 (six) hours as needed.  30 tablet  0    Review of Systems  Constitutional: Positive for chills.  HENT: Negative for ear discharge.   Eyes: Negative for blurred vision and double vision.       Hx of seeing black spots in periphery  Gastrointestinal: Negative for constipation.  Genitourinary: Negative for dysuria.  Neurological: Negative for seizures and loss of consciousness.   Physical Exam   Blood pressure 125/69, pulse 113, temperature 98.7 F (37.1 C), resp. rate 20, height 5\' 1"  (1.549 m), weight 130.999 kg (288 lb 12.8 oz), last menstrual period 12/26/2012, SpO2 99.00%.  Physical Exam   Nursing note and vitals reviewed. Constitutional: She is oriented to person, place, and time. She appears well-developed and well-nourished.  Cardiovascular: Regular rhythm, S1 normal, S2 normal, normal heart sounds, intact distal pulses and normal pulses.  Tachycardia present.   Respiratory: Effort normal and breath sounds normal. No respiratory distress. She has no wheezes.  GI: Soft. Bowel sounds are normal. There is tenderness in the right upper quadrant. There is no rebound.  Musculoskeletal: Normal range of motion. She exhibits no edema and no tenderness.  Neurological: She is alert and oriented to person, place, and time.  Skin: Skin is warm. No erythema.   FHT: 145bpm, moderate variability, +accels, no decels, Category 1 tracing UC: None  MAU Course  Procedures  MDM FHT monitored; UA obtained and remarkable for ketones and trace leuk  Assessment and Plan  27yo G2P1001 at [redacted]w[redacted]d with GDM and CHTN currently with a URI  #URI -  - counseled Ms. Christen to drink plenty of fluids - tylenol for fever/pain - sudafed for congestion - advised to return to MAU if symptoms worsen - advised to keep scheduled clinic appointment on 06/05/13   Jacquelin Hawking, MD 06/02/2013, 11:11 PM   I have seen and examined this patient and I agree with the above. Cam Hai 6:19 AM 06/03/2013

## 2013-06-02 NOTE — MAU Note (Signed)
Was hospitalized here a week or 2 ago due to infection in blood. Yesterday started sneezing and today feel weak, short of breath, chills and fever. Temp was 100.2 before coming to hospital today. Have not taken anything medicine for symptoms today

## 2013-06-03 DIAGNOSIS — J069 Acute upper respiratory infection, unspecified: Secondary | ICD-10-CM

## 2013-06-05 ENCOUNTER — Ambulatory Visit (INDEPENDENT_AMBULATORY_CARE_PROVIDER_SITE_OTHER): Payer: Medicaid Other | Admitting: Obstetrics & Gynecology

## 2013-06-05 VITALS — BP 118/81 | Temp 97.4°F | Wt 284.3 lb

## 2013-06-05 DIAGNOSIS — O24919 Unspecified diabetes mellitus in pregnancy, unspecified trimester: Secondary | ICD-10-CM

## 2013-06-05 DIAGNOSIS — O24912 Unspecified diabetes mellitus in pregnancy, second trimester: Secondary | ICD-10-CM

## 2013-06-05 LAB — COMPREHENSIVE METABOLIC PANEL
ALT: 16 U/L (ref 0–35)
Albumin: 3.5 g/dL (ref 3.5–5.2)
CO2: 20 mEq/L (ref 19–32)
Calcium: 9.5 mg/dL (ref 8.4–10.5)
Chloride: 108 mEq/L (ref 96–112)
Creat: 0.63 mg/dL (ref 0.50–1.10)
Sodium: 138 mEq/L (ref 135–145)
Total Protein: 6 g/dL (ref 6.0–8.3)

## 2013-06-05 LAB — POCT URINALYSIS DIP (DEVICE)
Hgb urine dipstick: NEGATIVE
Nitrite: POSITIVE — AB
Protein, ur: NEGATIVE mg/dL
Urobilinogen, UA: 0.2 mg/dL (ref 0.0–1.0)
pH: 6 (ref 5.0–8.0)

## 2013-06-05 NOTE — Progress Notes (Signed)
Pulse: 96 Doesn't have much appetite, feels like she forces herself to eat.  Returned her 24 hour urine today, needs labs.

## 2013-06-05 NOTE — Progress Notes (Signed)
fastings 68-96 all but one nml; p lunch 100-141 most nml, pp lunch 100-120, pp dinner 99-128;  Nitrites on UA.  BP under control.   44% by [place] last week.  Pannus make fundal height erroneous.  Has [place] scheduled with MFM in one month. Pt turned in 24 hour urine.  Drawing CMP today.

## 2013-06-06 LAB — CREATININE CLEARANCE, URINE, 24 HOUR: Creatinine, 24H Ur: 1199 mg/d (ref 700–1800)

## 2013-06-06 LAB — PROTEIN, URINE, 24 HOUR
Protein, 24H Urine: 115 mg/d — ABNORMAL HIGH (ref 50–100)
Protein, Urine: 10 mg/dL

## 2013-06-07 LAB — CULTURE, OB URINE

## 2013-06-08 ENCOUNTER — Encounter: Payer: Self-pay | Admitting: Family Medicine

## 2013-06-08 ENCOUNTER — Other Ambulatory Visit: Payer: Self-pay | Admitting: Obstetrics & Gynecology

## 2013-06-08 MED ORDER — NITROFURANTOIN MONOHYD MACRO 100 MG PO CAPS: 100.0000 mg | ORAL_CAPSULE | Freq: Two times a day (BID) | ORAL | Status: DC

## 2013-06-08 NOTE — Progress Notes (Signed)
Pt has uti sensitive to macrobid.  Rx e-prescribed to patient.  RN to call pt.

## 2013-06-13 NOTE — Progress Notes (Signed)
Pt informed

## 2013-06-19 ENCOUNTER — Encounter: Payer: Medicaid Other | Admitting: Obstetrics & Gynecology

## 2013-06-20 ENCOUNTER — Encounter: Payer: Self-pay | Admitting: Internal Medicine

## 2013-06-27 ENCOUNTER — Other Ambulatory Visit: Payer: Self-pay | Admitting: Obstetrics & Gynecology

## 2013-06-27 DIAGNOSIS — E119 Type 2 diabetes mellitus without complications: Secondary | ICD-10-CM

## 2013-06-28 ENCOUNTER — Encounter: Payer: Self-pay | Admitting: Obstetrics & Gynecology

## 2013-06-28 ENCOUNTER — Ambulatory Visit (HOSPITAL_COMMUNITY)
Admission: RE | Admit: 2013-06-28 | Discharge: 2013-06-28 | Disposition: A | Discharge status: Home / Self Care | Payer: Medicaid Other | Source: Ambulatory Visit | Attending: Obstetrics & Gynecology | Admitting: Obstetrics & Gynecology

## 2013-06-28 VITALS — BP 109/60 | HR 89 | Wt 285.5 lb

## 2013-06-28 DIAGNOSIS — E669 Obesity, unspecified: Secondary | ICD-10-CM | POA: Insufficient documentation

## 2013-06-28 DIAGNOSIS — C9241 Acute promyelocytic leukemia, in remission: Secondary | ICD-10-CM

## 2013-06-28 DIAGNOSIS — E1165 Type 2 diabetes mellitus with hyperglycemia: Secondary | ICD-10-CM

## 2013-06-28 DIAGNOSIS — O10019 Pre-existing essential hypertension complicating pregnancy, unspecified trimester: Secondary | ICD-10-CM | POA: Insufficient documentation

## 2013-06-28 DIAGNOSIS — O24919 Unspecified diabetes mellitus in pregnancy, unspecified trimester: Secondary | ICD-10-CM | POA: Insufficient documentation

## 2013-06-28 DIAGNOSIS — IMO0002 Reserved for concepts with insufficient information to code with codable children: Secondary | ICD-10-CM

## 2013-06-28 DIAGNOSIS — I10 Essential (primary) hypertension: Secondary | ICD-10-CM

## 2013-06-28 DIAGNOSIS — E119 Type 2 diabetes mellitus without complications: Secondary | ICD-10-CM

## 2013-06-28 NOTE — Progress Notes (Signed)
Melissa Washington  was seen today for an ultrasound appointment.  See full report in [facility].  Impression: IUP at 29+6 weeks Class B GDM, CHTN, hx of leukemia (in remission) Fetal growth is appropriate (70th %tile) Anterior placenta without previa Normal amniotic fluid volume   Recommendations: Recommend follow-up ultrasound examination in 4 weeks for interval growth Antepartum fetal testing beginning at 32 weeks.  Alpha Gula, MD

## 2013-07-06 ENCOUNTER — Inpatient Hospital Stay (HOSPITAL_COMMUNITY)
Admission: AD | Admit: 2013-07-06 | Discharge: 2013-07-10 | DRG: 781 | Disposition: A | Discharge status: Home / Self Care | Payer: Medicaid Other | Source: Ambulatory Visit | Attending: Obstetrics & Gynecology | Admitting: Obstetrics & Gynecology

## 2013-07-06 ENCOUNTER — Encounter (HOSPITAL_COMMUNITY): Payer: Self-pay | Admitting: Emergency Medicine

## 2013-07-06 DIAGNOSIS — E871 Hypo-osmolality and hyponatremia: Secondary | ICD-10-CM | POA: Diagnosis present

## 2013-07-06 DIAGNOSIS — E669 Obesity, unspecified: Secondary | ICD-10-CM | POA: Diagnosis present

## 2013-07-06 DIAGNOSIS — E1165 Type 2 diabetes mellitus with hyperglycemia: Secondary | ICD-10-CM | POA: Diagnosis present

## 2013-07-06 DIAGNOSIS — A498 Other bacterial infections of unspecified site: Secondary | ICD-10-CM | POA: Diagnosis present

## 2013-07-06 DIAGNOSIS — O0990 Supervision of high risk pregnancy, unspecified, unspecified trimester: Secondary | ICD-10-CM

## 2013-07-06 DIAGNOSIS — N390 Urinary tract infection, site not specified: Secondary | ICD-10-CM

## 2013-07-06 DIAGNOSIS — O24919 Unspecified diabetes mellitus in pregnancy, unspecified trimester: Secondary | ICD-10-CM | POA: Diagnosis present

## 2013-07-06 DIAGNOSIS — R109 Unspecified abdominal pain: Secondary | ICD-10-CM

## 2013-07-06 DIAGNOSIS — N10 Acute tubulo-interstitial nephritis: Secondary | ICD-10-CM | POA: Diagnosis present

## 2013-07-06 DIAGNOSIS — B9620 Unspecified Escherichia coli [E. coli] as the cause of diseases classified elsewhere: Secondary | ICD-10-CM | POA: Diagnosis present

## 2013-07-06 DIAGNOSIS — O2390 Unspecified genitourinary tract infection in pregnancy, unspecified trimester: Principal | ICD-10-CM | POA: Diagnosis present

## 2013-07-06 DIAGNOSIS — E119 Type 2 diabetes mellitus without complications: Secondary | ICD-10-CM | POA: Diagnosis present

## 2013-07-06 DIAGNOSIS — C9241 Acute promyelocytic leukemia, in remission: Secondary | ICD-10-CM | POA: Diagnosis present

## 2013-07-06 DIAGNOSIS — R7881 Bacteremia: Secondary | ICD-10-CM

## 2013-07-06 DIAGNOSIS — O99210 Obesity complicating pregnancy, unspecified trimester: Secondary | ICD-10-CM | POA: Diagnosis present

## 2013-07-06 DIAGNOSIS — O10019 Pre-existing essential hypertension complicating pregnancy, unspecified trimester: Secondary | ICD-10-CM | POA: Diagnosis present

## 2013-07-06 DIAGNOSIS — T80219S Unspecified infection due to central venous catheter, sequela: Secondary | ICD-10-CM

## 2013-07-06 DIAGNOSIS — I10 Essential (primary) hypertension: Secondary | ICD-10-CM | POA: Diagnosis present

## 2013-07-06 DIAGNOSIS — O368390 Maternal care for abnormalities of the fetal heart rate or rhythm, unspecified trimester, not applicable or unspecified: Secondary | ICD-10-CM | POA: Diagnosis not present

## 2013-07-06 DIAGNOSIS — R509 Fever, unspecified: Secondary | ICD-10-CM

## 2013-07-06 DIAGNOSIS — Z794 Long term (current) use of insulin: Secondary | ICD-10-CM

## 2013-07-06 DIAGNOSIS — Z6841 Body Mass Index (BMI) 40.0 and over, adult: Secondary | ICD-10-CM

## 2013-07-06 LAB — WET PREP, GENITAL
Clue Cells Wet Prep HPF POC: NONE SEEN
Yeast Wet Prep HPF POC: NONE SEEN

## 2013-07-06 LAB — COMPREHENSIVE METABOLIC PANEL
ALT: 19 U/L (ref 0–35)
Calcium: 9.8 mg/dL (ref 8.4–10.5)
Creatinine, Ser: 0.61 mg/dL (ref 0.50–1.10)
GFR calc Af Amer: >90 mL/min (ref >90)
Glucose, Bld: 90 mg/dL (ref 70–99)
Sodium: 133 mEq/L — ABNORMAL LOW (ref 135–145)
Total Protein: 6.8 g/dL (ref 6.0–8.3)

## 2013-07-06 LAB — URINE MICROSCOPIC-ADD ON

## 2013-07-06 LAB — GLUCOSE, CAPILLARY: Glucose-Capillary: 150 mg/dL — ABNORMAL HIGH (ref 70–99)

## 2013-07-06 LAB — CBC WITH DIFFERENTIAL/PLATELET
Basophils Relative: 0 % (ref 0–1)
Eosinophils Absolute: 0 10*3/uL (ref 0.0–0.7)
MCH: 28.6 pg (ref 26.0–34.0)
MCHC: 35.4 g/dL (ref 30.0–36.0)
Monocytes Relative: 10 % (ref 3–12)
Neutrophils Relative %: 80 % — ABNORMAL HIGH (ref 43–77)
Platelets: 241 10*3/uL (ref 150–400)

## 2013-07-06 LAB — URINALYSIS, ROUTINE W REFLEX MICROSCOPIC
Hgb urine dipstick: NEGATIVE
Ketones, ur: >80 mg/dL — AB
Protein, ur: NEGATIVE mg/dL
Urobilinogen, UA: 1 mg/dL (ref 0.0–1.0)

## 2013-07-06 LAB — OB RESULTS CONSOLE GC/CHLAMYDIA
Chlamydia: NEGATIVE
Gonorrhea: NEGATIVE

## 2013-07-06 LAB — LIPASE, BLOOD: Lipase: 42 U/L (ref 11–59)

## 2013-07-06 MED ORDER — ZOLPIDEM TARTRATE 5 MG PO TABS
5.0000 mg | ORAL_TABLET | Freq: Every evening | ORAL | Status: DC | PRN
  Administered 2013-07-07: 5 mg via ORAL
  Filled 2013-07-06: qty 1

## 2013-07-06 MED ORDER — OXYCODONE-ACETAMINOPHEN 5-325 MG PO TABS
1.0000 | ORAL_TABLET | Freq: Four times a day (QID) | ORAL | Status: DC | PRN
  Administered 2013-07-08 – 2013-07-10 (×5): 2 via ORAL
  Filled 2013-07-06 (×5): qty 2

## 2013-07-06 MED ORDER — CALCIUM CARBONATE ANTACID 500 MG PO CHEW: 2.0000 | CHEWABLE_TABLET | ORAL | Status: DC | PRN

## 2013-07-06 MED ORDER — ACETAMINOPHEN 500 MG PO TABS
1000.0000 mg | ORAL_TABLET | Freq: Once | ORAL | Status: AC
  Administered 2013-07-06: 1000 mg via ORAL
  Filled 2013-07-06: qty 2

## 2013-07-06 MED ORDER — INSULIN NPH (HUMAN) (ISOPHANE) 100 UNIT/ML ~~LOC~~ SUSP
17.0000 [IU] | Freq: Every day | SUBCUTANEOUS | Status: DC
  Administered 2013-07-06: 17 [IU] via SUBCUTANEOUS
  Filled 2013-07-06: qty 10

## 2013-07-06 MED ORDER — ACETAMINOPHEN 325 MG PO TABS: 650.0000 mg | ORAL_TABLET | ORAL | Status: DC | PRN

## 2013-07-06 MED ORDER — DOCUSATE SODIUM 100 MG PO CAPS
100.0000 mg | ORAL_CAPSULE | Freq: Every day | ORAL | Status: DC
  Administered 2013-07-07 – 2013-07-09 (×3): 100 mg via ORAL
  Filled 2013-07-06 (×2): qty 1

## 2013-07-06 MED ORDER — INSULIN ASPART 100 UNIT/ML ~~LOC~~ SOLN
20.0000 [IU] | Freq: Three times a day (TID) | SUBCUTANEOUS | Status: DC
  Administered 2013-07-07 (×2): via SUBCUTANEOUS
  Administered 2013-07-07 – 2013-07-10 (×7): 20 [IU] via SUBCUTANEOUS

## 2013-07-06 MED ORDER — DEXTROSE 5 % IV SOLN
1.0000 g | Freq: Once | INTRAVENOUS | Status: AC
  Administered 2013-07-06: 1 g via INTRAVENOUS
  Filled 2013-07-06: qty 10

## 2013-07-06 MED ORDER — MORPHINE SULFATE 4 MG/ML IJ SOLN
2.0000 mg | Freq: Once | INTRAMUSCULAR | Status: AC
  Administered 2013-07-06: 2 mg via INTRAVENOUS
  Filled 2013-07-06: qty 1

## 2013-07-06 MED ORDER — LACTATED RINGERS IV SOLN: INTRAVENOUS | Status: DC

## 2013-07-06 MED ORDER — INSULIN NPH (HUMAN) (ISOPHANE) 100 UNIT/ML ~~LOC~~ SUSP
15.0000 [IU] | Freq: Every day | SUBCUTANEOUS | Status: DC
  Administered 2013-07-07 – 2013-07-10 (×4): 15 [IU] via SUBCUTANEOUS
  Filled 2013-07-06: qty 10

## 2013-07-06 MED ORDER — DEXTROSE 5 % IV SOLN
1.0000 g | Freq: Two times a day (BID) | INTRAVENOUS | Status: DC
  Administered 2013-07-07 – 2013-07-10 (×7): 1 g via INTRAVENOUS
  Filled 2013-07-06 (×8): qty 10

## 2013-07-06 MED ORDER — SODIUM CHLORIDE 0.9 % IV BOLUS (SEPSIS)
1000.0000 mL | Freq: Once | INTRAVENOUS | Status: AC
  Administered 2013-07-06: 1000 mL via INTRAVENOUS

## 2013-07-06 MED ORDER — SODIUM CHLORIDE 0.9 % IV SOLN
INTRAVENOUS | Status: DC
  Administered 2013-07-06 – 2013-07-10 (×10): via INTRAVENOUS

## 2013-07-06 MED ORDER — ONDANSETRON HCL 4 MG/2ML IJ SOLN: 4.0000 mg | Freq: Four times a day (QID) | INTRAMUSCULAR | Status: DC | PRN

## 2013-07-06 MED ORDER — PRENATAL MULTIVITAMIN CH
1.0000 | ORAL_TABLET | Freq: Every day | ORAL | Status: DC
  Administered 2013-07-07 – 2013-07-09 (×2): 1 via ORAL
  Filled 2013-07-06 (×2): qty 1

## 2013-07-06 MED ORDER — BUTORPHANOL TARTRATE 1 MG/ML IJ SOLN: 2.0000 mg | INTRAMUSCULAR | Status: DC | PRN

## 2013-07-06 NOTE — Progress Notes (Signed)
1536  Arrived to ED to evaluate this 27 yo G2 P2 at 31 wks with complaints of abdominal pain and fever.1610  Spoke with Dr. Macon Large regarding fhr tachycardia and uc's and pt complaints.  Orders for IV bolus, cath UA, and transfer patient to antenatal unit via [facility].  1653  [facility].  UC's continue, fhr improved.

## 2013-07-06 NOTE — H&P (Signed)
Melissa Washington is an 27 y.o. female.   Chief Complaint: Fever HPI: Melissa Washington is a 27 y.o. G2P1001 at [redacted]w[redacted]d by R=7 presentd to [facility] ED for fever. Pt reports onset of fever morning of 10/16 associated with nausea and vomiting. Pt reports chills and has mild associated headache. Prior to this morning pt had been in usual state of health. Pt reports no pain with urination, no complaints of diarrhea or constipation.  Pt endorses mild abdominal pain and decreased fetal movement from baseline. Pt denies loss of fluid or vaginal bleeding. No obvious painful ctx but this is her first pregnancy. In ED pt was seen contracting with abdominal pain per transfer nurse.   Of note pt has hx of pyelonephritis in September of 2014 with similar presentation. Pt had a port in place for chemo treatment from leukemia (last tx aug2013) and it was removed. Pt had PICC line placed and was given 2 weeks of antibiotics IV, reported some PO, but has not been on any suppression since.  Pt is a chronic diabetic DM-B and CHTN. Pt has had baseline labs and is on insulin NPH AM15 PM17 Novolog 20 TIDWM BP currently not on medication  Past Medical History  Diagnosis Date  . Diabetes mellitus   . Hypertension   . Nephrolithiasis   . 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 Hepatic steatosis 01/17/2012  . Cholelithiasis 01/17/2012  . Headache(784.0)   . Leukemia, acute monocytic, in remission   . Bacteremia associated with IV line 05/01/2013    Port-a-cath infection, removed 8/6 by IR. Ecoli grew out in cultures. Sent home with Ceftriaxone to complete 14 day course.   . Bacteremia due to Escherichia coli 05/01/2013    Port-a-cath infection, removed 8/6 by IR. Ecoli grew out in cultures. Sent home with Ceftriaxone to complete 14 day course.     Past Surgical History  Procedure Laterality Date  . Portacath placement      [facility]  . Cholecystectomy  01/31/2012    Procedure: LAPAROSCOPIC CHOLECYSTECTOMY  WITH INTRAOPERATIVE CHOLANGIOGRAM;  Surgeon: Clovis Pu. Cornett, MD;  Location: [facility] OR;  Service: General;  Laterality: N/A;    Family History  Problem Relation Age of Onset  . Kidney disease Mother   . Hypertension Mother   . Epilepsy Mother   . Sleep apnea Mother   . Kidney disease Maternal Grandmother   . Diabetes Maternal Grandmother   . Diabetes Father    Social History:  reports that she has never smoked. She has never used smokeless tobacco. She reports that she does not drink alcohol or use illicit drugs.  Allergies:  Allergies  Allergen Reactions  . Ultram [Tramadol Hcl] Nausea And Vomiting    Medications Prior to Admission  Medication Sig Dispense Refill  . insulin aspart (NOVOLOG) 100 UNIT/ML injection Inject 20 Units into the skin 3 (three) times daily with meals.  10 mL  12  . insulin NPH (HUMULIN N,NOVOLIN N) 100 UNIT/ML injection Inject 15-17 Units into the skin 2 (two) times daily. Inject 15 units in the morning and 17 units at night.      . Prenatal Vit-Fe Fumarate-FA (MULTIVITAMIN-PRENATAL) 27-0.8 MG TABS Take 1 tablet by mouth daily at 12 noon.  30 each  3    Results for orders placed during the hospital encounter of 07/06/13 (from the past 48 hour(s))  CBC WITH DIFFERENTIAL     Status: Abnormal   Collection Time    07/06/13  3:42 PM      Result Value Range   WBC 7.3  4.0 - 10.5 K/uL   RBC 3.85 (*) 3.87 - 5.11 MIL/uL   Hemoglobin 11.0 (*) 12.0 - 15.0 g/dL   HCT 40.9 (*) 81.1 - 91.4 %   MCV 80.8  78.0 - 100.0 fL   MCH 28.6  26.0 - 34.0 pg   MCHC 35.4  30.0 - 36.0 g/dL   RDW 78.2  95.6 - 21.3 %   Platelets 241  150 - 400 K/uL   Neutrophils Relative % 80 (*) 43 - 77 %   Neutro Abs 5.8  1.7 - 7.7 K/uL   Lymphocytes Relative 10 (*) 12 - 46 %   Lymphs Abs 0.7  0.7 - 4.0 K/uL   Monocytes Relative 10  3 - 12 %   Monocytes Absolute 0.7  0.1 - 1.0 K/uL   Eosinophils Relative 0  0 - 5 %   Eosinophils Absolute 0.0  0.0 - 0.7 K/uL   Basophils Relative 0  0 - 1 %    Basophils Absolute 0.0  0.0 - 0.1 K/uL  COMPREHENSIVE METABOLIC PANEL     Status: Abnormal   Collection Time    07/06/13  3:42 PM      Result Value Range   Sodium 133 (*) 135 - 145 mEq/L   Potassium 3.6  3.5 - 5.1 mEq/L   Chloride 99  96 - 112 mEq/L   CO2 15 (*) 19 - 32 mEq/L   Glucose, Bld 90  70 - 99 mg/dL   BUN 5 (*) 6 - 23 mg/dL   Creatinine, Ser 0.86  0.50 - 1.10 mg/dL   Calcium 9.8  8.4 - 57.8 mg/dL   Total Protein 6.8  6.0 - 8.3 g/dL   Albumin 2.9 (*) 3.5 - 5.2 g/dL   AST 15  0 - 37 U/L   ALT 19  0 - 35 U/L   Alkaline Phosphatase 168 (*) 39 - 117 U/L   Total Bilirubin 2.0 (*) 0.3 - 1.2 mg/dL   GFR calc non Af Amer >90  >90 mL/min   GFR calc Af Amer >90  >90 mL/min   Comment: (NOTE)     The eGFR has been calculated using the CKD EPI equation.     This calculation has not been validated in all clinical situations.     eGFR's persistently <90 mL/min signify possible Chronic Kidney     Disease.  LIPASE, BLOOD     Status: None   Collection Time    07/06/13  3:42 PM      Result Value Range   Lipase 42  11 - 59 U/L  URINALYSIS, ROUTINE W REFLEX MICROSCOPIC     Status: Abnormal   Collection Time    07/06/13  4:29 PM      Result Value Range   Color, Urine YELLOW  YELLOW   APPearance CLEAR  CLEAR   Specific Gravity, Urine 1.013  1.005 - 1.030   pH 5.5  5.0 - 8.0   Glucose, UA NEGATIVE  NEGATIVE mg/dL   Hgb urine dipstick NEGATIVE  NEGATIVE   Bilirubin Urine SMALL (*) NEGATIVE   Ketones, ur >80 (*) NEGATIVE mg/dL   Protein, ur NEGATIVE  NEGATIVE mg/dL   Urobilinogen, UA 1.0  0.0 - 1.0 mg/dL   Nitrite NEGATIVE  NEGATIVE   Leukocytes, UA MODERATE (*) NEGATIVE  URINE MICROSCOPIC-ADD ON     Status: Abnormal   Collection Time  07/06/13  4:29 PM      Result Value Range   Squamous Epithelial / LPF RARE  RARE   WBC, UA 7-10  <3 WBC/hpf   RBC / HPF 0-2  <3 RBC/hpf   Bacteria, UA FEW (*) RARE   Urine-Other MUCOUS PRESENT    WET PREP, GENITAL     Status: Abnormal    Collection Time    07/06/13  6:50 PM      Result Value Range   Yeast Wet Prep HPF POC NONE SEEN  NONE SEEN   Trich, Wet Prep NONE SEEN  NONE SEEN   Clue Cells Wet Prep HPF POC NONE SEEN  NONE SEEN   WBC, Wet Prep HPF POC FEW (*) NONE SEEN   Comment: MODERATE BACTERIA SEEN   No results found.  Review of Systems  Constitutional: Positive for fever, chills and malaise/fatigue.  Eyes: Negative for blurred vision.  Respiratory: Negative for cough.   Cardiovascular: Negative for chest pain and palpitations.  Gastrointestinal: Positive for nausea and vomiting. Negative for heartburn, abdominal pain, diarrhea and constipation.  Genitourinary: Negative for dysuria, urgency, frequency, hematuria and flank pain.  Neurological: Positive for headaches.    Blood pressure 115/54, pulse 107, temperature 98.9 F (37.2 C), temperature source Oral, resp. rate 20, height 5\' 2"  (1.575 m), weight 131.543 kg (290 lb), last menstrual period 12/26/2012, SpO2 98.00%. Physical Exam  Nursing note and vitals reviewed. Constitutional: She is oriented to person, place, and time. She appears well-developed and well-nourished. No distress (pt is uncomfortable because of pain).  HENT:  Head: Normocephalic and atraumatic.  Eyes: Conjunctivae and EOM are normal. Right eye exhibits no discharge. Left eye exhibits no discharge.  Neck: Normal range of motion. Neck supple.  Cardiovascular: Normal rate, regular rhythm, normal heart sounds and intact distal pulses.  Exam reveals no gallop and no friction rub.   No murmur heard. Respiratory: Effort normal and breath sounds normal. No respiratory distress. She has no wheezes. She has no rales. She exhibits no tenderness.  GI: Soft. Bowel sounds are normal. She exhibits no distension and no mass. There is no tenderness. There is no rebound and no guarding.  Genitourinary: Vagina normal. No vaginal discharge (malodorous) found.  Musculoskeletal: Normal range of motion. She  exhibits no edema and no tenderness.  Neurological: She is alert and oriented to person, place, and time.  Skin: She is not diaphoretic.  Psychiatric: She has a normal mood and affect. Her behavior is normal. Thought content normal.  Dilation: Closed Effacement (%): Thick Exam by:: Dr. Ike Bene   FHT: 150s mod variablity, no recent accels, occassioanl variable or unID'd decle 2/2 not picking up toco.  Toco:  No ctx seen here. Reported q5-8 previously  Assessment/Plan KIARALIZ RAFUSE is a 27 y.o. G2P1001 at [redacted]w[redacted]d by R=7 presents with likely recurrent urosepsis.  #Urosepsis: pt with fever and initial tachycardia with urine and hx suspicious for recurrent infection. Pt appears to be stable for floor monitoring on Antenatal. Will start pt on rocephin, blood cultures and urine cultures already drawn. Stable WBC. While pt meets /4 SIRS criteria, pt does not appear to be in septic shock. Prior cultures pansensitive - ceftriaxone 1g q12 hrs - f/u cultures - will require discharge with prophylaxis  #hyponatremia: likely related to poor PO intake, will hydrate with NS and recheck in AM  #FWB: Cat II strip for occassional variable decel. Will perform BPP for further evaluation and monitoring.   #Diabetes: Previous insulin regimen in  NPH15/17, Novolog 20 Qac, will continue home regimen and take fasting and 2 hr PP  #CHTN: currently normal blood pressure on no medication. Will monitor at this time to avoid masking hypotension or hypertension.  #luekemia and other medical problems: Stable no acute issues nothing further to do.  FEN: NS 125/hr / Replete PRN / Ante regular diabetic diet  Dispo: pending response to rocephin and cultures  Prohylaxis: SCDS and ambulation   Melissa Washington RYAN 07/06/2013, 7:50 PM

## 2013-07-06 NOTE — ED Notes (Signed)
Rapid Response OB RN was called to come assess patient and her baby.  Will move to room

## 2013-07-06 NOTE — ED Notes (Signed)
Pt is [redacted] weeks pregnant and she reports that she has been having abdominal pain which she can not tell me if it feels like contractions but she states it "comes and goes".  Pt states that this began this am and she was having pressure in "crotch" and she feels pressure when she walks.  Pt states that she has had some leaking of fluid but she feels that the fluid is urine.  Pt denies any vaginal bleeding.  Pt states that the baby has been moving less today.  Pt also states that she has been feeling sick and has had a fever and body aches.  Pt states [facility] care and EDD is 12/18.  This is her 2nd pregnancy

## 2013-07-06 NOTE — ED Notes (Signed)
Rapid Response RN at the bedside.

## 2013-07-06 NOTE — ED Provider Notes (Signed)
CSN: [id number]     Arrival date & time 07/06/13  1455 History   First MD Initiated Contact with Patient 07/06/13 1532     Chief Complaint  Patient presents with  . Abdominal Pain   (Consider location/radiation/quality/duration/timing/severity/associated sxs/prior Treatment) HPI Comments: 27 year old female who is a G2 P1 at approximately [redacted] weeks gestation with an uncomplicated pregnancy according to the patient She states that the pain initially started in her lower abdomen and pressure in her "crotch" followed by a migration of the pain upward. This pain comes in episodes, the last approximately 5 minutes and then it resolves. She denies dysuria, hematuria, diarrhea, hematochezia, cough, shortness of breath, rashes, headache, sore throat, changes in vision. Of note the patient was admitted to the hospital at the beginning of August after having a bacteremia from Escherichia coli secondary to a Port-A-Cath that had previously been used to treat her leukemia with chemotherapy. She is no longer on chemotherapy, she no longer has the Port-A-Cath.  Her temperature this morning was 100.6 measured at home.  Patient is a 27 y.o. female presenting with abdominal pain. The history is provided by the patient and medical records.  Abdominal Pain   Past Medical History  Diagnosis Date  . Diabetes mellitus   . Hypertension   . Nephrolithiasis   . 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 Hepatic steatosis 01/17/2012  . Cholelithiasis 01/17/2012  . Headache(784.0)   . Leukemia, acute monocytic, in remission   . Bacteremia associated with IV line 05/01/2013    Port-a-cath infection, removed 8/6 by IR. Ecoli grew out in cultures. Sent home with Ceftriaxone to complete 14 day course.   . Bacteremia due to Escherichia coli 05/01/2013    Port-a-cath infection, removed 8/6 by IR. Ecoli grew out in cultures. Sent home with Ceftriaxone to complete 14 day course.    Past Surgical History   Procedure Laterality Date  . Portacath placement      [facility]  . Cholecystectomy  01/31/2012    Procedure: LAPAROSCOPIC CHOLECYSTECTOMY WITH INTRAOPERATIVE CHOLANGIOGRAM;  Surgeon: Clovis Pu. Cornett, MD;  Location: [facility] OR;  Service: General;  Laterality: N/A;   Family History  Problem Relation Age of Onset  . Kidney disease Mother   . Hypertension Mother   . Epilepsy Mother   . Sleep apnea Mother   . Kidney disease Maternal Grandmother   . Diabetes Maternal Grandmother   . Diabetes Father    History  Substance Use Topics  . Smoking status: Never Smoker   . Smokeless tobacco: Never Used  . Alcohol Use: No   OB History   Grav Para Term Preterm Abortions TAB SAB Ect Mult Living   2 1 1  0 0 0 0 0 0 1     Review of Systems  Gastrointestinal: Positive for abdominal pain.  All other systems reviewed and are negative.    Allergies  Ultram  Home Medications   Current Outpatient Rx  Name  Route  Sig  Dispense  Refill  . insulin aspart (NOVOLOG) 100 UNIT/ML injection   Subcutaneous   Inject 20 Units into the skin 3 (three) times daily with meals.   10 mL   12   . insulin NPH (HUMULIN N,NOVOLIN N) 100 UNIT/ML injection   Subcutaneous   Inject 15-17 Units into the skin 2 (two) times daily. Inject 15 units in the morning and 17 units at night.         . Prenatal Vit-Fe Fumarate-FA (  MULTIVITAMIN-PRENATAL) 27-0.8 MG TABS   Oral   Take 1 tablet by mouth daily at 12 noon.   30 each   3    BP 105/68  Pulse 118  Temp(Src) 100.3 F (37.9 C) (Oral)  Resp 18  SpO2 98%  LMP 12/26/2012 Physical Exam  Nursing note and vitals reviewed. Constitutional: She appears well-developed and well-nourished.  Uncomfortable appearing  HENT:  Head: Normocephalic and atraumatic.  Mouth/Throat: Oropharynx is clear and moist. No oropharyngeal exudate.  Eyes: Conjunctivae and EOM are normal. Pupils are equal, round, and reactive to light. Right eye exhibits no discharge. Left eye exhibits no  discharge. No scleral icterus.  Neck: Normal range of motion. Neck supple. No JVD present. No thyromegaly present.  Cardiovascular: Regular rhythm, normal heart sounds and intact distal pulses.  Exam reveals no gallop and no friction rub.   No murmur heard. Tachycardic to 120, strong pulses at the radial arteries  Pulmonary/Chest: Effort normal and breath sounds normal. No respiratory distress. She has no wheezes. She has no rales.  Abdominal: Soft. Bowel sounds are normal. She exhibits no distension and no mass. There is tenderness ( Right upper quadrant tenderness, gravid uterus above the umbilicus, minimal to no tenderness in the lower abdomen, no surgical abdomen was present).  Musculoskeletal: Normal range of motion. She exhibits no edema and no tenderness.  Lymphadenopathy:    She has no cervical adenopathy.  Neurological: She is alert. Coordination normal.  Skin: Skin is warm. No rash noted. She is diaphoretic ( Slightly diaphoretic). No erythema.  Psychiatric: She has a normal mood and affect. Her behavior is normal.    ED Course  Procedures (including critical care time) Labs Review Labs Reviewed  CULTURE, BLOOD (ROUTINE X 2)  CULTURE, BLOOD (ROUTINE X 2)  CBC WITH DIFFERENTIAL  URINALYSIS, ROUTINE W REFLEX MICROSCOPIC  COMPREHENSIVE METABOLIC PANEL  LIPASE, BLOOD   Imaging Review No results found.  EKG Interpretation   None       MDM   1. Preterm labor, fetus 2   2. Abdominal pain   3. Fever, unknown origin    The patient appears uncomfortable, she has a fever and is tachycardic raising concern for systemic inflammatory response syndrome, will check laboratory data, discussed with OB/GYN, obtain urinalysis but there is concern that this could be related to a urinary tract infection causing contractions. She has been following closely with her OB/GYN and states her last visit was in within the last 2-3 weeks.  Rapid response OB/GYN nurse has arrived and evaluated  the patient, not only is the patient tachycardic but the baby is also tachycardic at a also approximately 170. At this time it would be prudent to transfer the patient to the [facility], the OB/GYN on call has accepted the patient. I have personally placed an IV secondary to not having IV access, IV fluids have been started, medications including Tylenol have been ordered and a urinalysis has been obtained by in and out catheterization. The patient has a normal blood pressure at this time.  The fetal monitoring shows that the patient is in pre-term labor. She is contracting every 3-4 minutes.  The patient is febrile, tachycardic, in preterm labor actively and appears uncomfortable, will start broad spectrum antibiotics and transfer the patient [facility]. She is critically ill at this time, critical care has been provided.  Angiocath insertion Performed by: Vida Roller  Consent: Verbal consent obtained. Risks and benefits: risks, benefits and alternatives were discussed Time out: Immediately  prior to procedure a "time out" was called to verify the correct patient, procedure, equipment, support staff and site/side marked as required.  Preparation: Patient was prepped and draped in the usual sterile fashion.  Vein Location: Left antecubital fossa  Not Ultrasound Guided  Gauge: 20  Normal blood return and flush without difficulty Patient tolerance: Patient tolerated the procedure well with no immediate complications.     CRITICAL CARE Performed by: Vida Roller Total critical care time: 30 Critical care time was exclusive of separately billable procedures and treating other patients. Critical care was necessary to treat or prevent imminent or life-threatening deterioration. Critical care was time spent personally by me on the following activities: development of treatment plan with patient and/or surrogate as well as nursing, discussions with consultants, evaluation of  patient's response to treatment, examination of patient, obtaining history from patient or surrogate, ordering and performing treatments and interventions, ordering and review of laboratory studies, ordering and review of radiographic studies, pulse oximetry and re-evaluation of patient's condition.   Vida Roller, MD 07/06/13 [id number]

## 2013-07-07 DIAGNOSIS — R7881 Bacteremia: Secondary | ICD-10-CM

## 2013-07-07 DIAGNOSIS — Z6841 Body Mass Index (BMI) 40.0 and over, adult: Secondary | ICD-10-CM

## 2013-07-07 DIAGNOSIS — O2390 Unspecified genitourinary tract infection in pregnancy, unspecified trimester: Secondary | ICD-10-CM

## 2013-07-07 DIAGNOSIS — O10019 Pre-existing essential hypertension complicating pregnancy, unspecified trimester: Secondary | ICD-10-CM

## 2013-07-07 LAB — CBC WITH DIFFERENTIAL/PLATELET
Basophils Absolute: 0 10*3/uL (ref 0.0–0.1)
Basophils Relative: 0 % (ref 0–1)
Eosinophils Absolute: 0.1 10*3/uL (ref 0.0–0.7)
Eosinophils Relative: 1 % (ref 0–5)
HCT: 26.3 % — ABNORMAL LOW (ref 36.0–46.0)
MCHC: 36.1 g/dL — ABNORMAL HIGH (ref 30.0–36.0)
MCV: 81.2 fL (ref 78.0–100.0)
Monocytes Absolute: 0.8 10*3/uL (ref 0.1–1.0)
Neutro Abs: 3.5 10*3/uL (ref 1.7–7.7)
RDW: 13.4 % (ref 11.5–15.5)

## 2013-07-07 LAB — BASIC METABOLIC PANEL
BUN: 7 mg/dL (ref 6–23)
CO2: 17 mEq/L — ABNORMAL LOW (ref 19–32)
Calcium: 8.8 mg/dL (ref 8.4–10.5)
Chloride: 107 mEq/L (ref 96–112)
Creatinine, Ser: 0.61 mg/dL (ref 0.50–1.10)
GFR calc Af Amer: >90 mL/min (ref >90)
Sodium: 133 mEq/L — ABNORMAL LOW (ref 135–145)

## 2013-07-07 LAB — GLUCOSE, CAPILLARY
Glucose-Capillary: 122 mg/dL — ABNORMAL HIGH (ref 70–99)
Glucose-Capillary: 124 mg/dL — ABNORMAL HIGH (ref 70–99)
Glucose-Capillary: 88 mg/dL (ref 70–99)

## 2013-07-07 LAB — GC/CHLAMYDIA PROBE AMP: GC Probe RNA: NEGATIVE

## 2013-07-07 MED ORDER — INSULIN NPH (HUMAN) (ISOPHANE) 100 UNIT/ML ~~LOC~~ SUSP
20.0000 [IU] | Freq: Every day | SUBCUTANEOUS | Status: DC
  Administered 2013-07-07 – 2013-07-09 (×3): 20 [IU] via SUBCUTANEOUS
  Filled 2013-07-07: qty 10

## 2013-07-07 NOTE — Progress Notes (Signed)
Patient ID: Melissa Washington, female   DOB: 06-Jan-1986, 27 y.o.   MRN: [id number] FACULTY PRACTICE ANTEPARTUM(COMPREHENSIVE) NOTE  Melissa Washington is a 27 y.o. G2P1001 at [redacted]w[redacted]d  who is admitted for presumed pyelonephritis.    Fetal presentation is unsure. Length of Stay:  1  Days  Date of admission:07/06/2013  Subjective: Patient reports feeling a little better, complaining of some right back pain Patient reports the fetal movement as active. Patient reports uterine contraction  activity as none. Patient reports  vaginal bleeding as none. Patient describes fluid per vagina as None.  Vitals:  Blood pressure 95/42, pulse 92, temperature 98.2 F (36.8 C), temperature source Oral, resp. rate 20, height 5\' 2"  (1.575 m), weight 290 lb (131.543 kg), last menstrual period 12/26/2012, SpO2 98.00%. Filed Vitals:   07/06/13 2027 07/06/13 2303 07/07/13 0624 07/07/13 0817  BP: 114/49 105/82  95/42  Pulse: 112 101  92  Temp: 98.4 F (36.9 C) 98.3 F (36.8 C) 98.3 F (36.8 C) 98.2 F (36.8 C)  TempSrc: Oral Oral Oral Oral  Resp: 18 18  20   Height:      Weight:      SpO2:       Physical Examination:  General appearance - alert, well appearing, and in no distress Pelvic Exam:  examination not indicated Cervical Exam: Not evaluated.  Extremities: no edema, redness or tenderness in the calves or thighs with DTRs 2+ bilaterally Membranes:intact  Fetal Monitoring:  Baseline: 130 bpm, Variability: Good {> 6 bpm), Accelerations: Reactive, Decelerations: Variable: mild and [place]: no contractions     Labs:  Results for orders placed during the hospital encounter of 07/06/13 (from the past 24 hour(s))  CBC WITH DIFFERENTIAL   Collection Time    07/06/13  3:42 PM      Result Value Range   WBC 7.3  4.0 - 10.5 K/uL   RBC 3.85 (*) 3.87 - 5.11 MIL/uL   Hemoglobin 11.0 (*) 12.0 - 15.0 g/dL   HCT 56.2 (*) 13.0 - 86.5 %   MCV 80.8  78.0 - 100.0 fL   MCH 28.6  26.0 - 34.0 pg   MCHC 35.4  30.0 - 36.0  g/dL   RDW 78.4  69.6 - 29.5 %   Platelets 241  150 - 400 K/uL   Neutrophils Relative % 80 (*) 43 - 77 %   Neutro Abs 5.8  1.7 - 7.7 K/uL   Lymphocytes Relative 10 (*) 12 - 46 %   Lymphs Abs 0.7  0.7 - 4.0 K/uL   Monocytes Relative 10  3 - 12 %   Monocytes Absolute 0.7  0.1 - 1.0 K/uL   Eosinophils Relative 0  0 - 5 %   Eosinophils Absolute 0.0  0.0 - 0.7 K/uL   Basophils Relative 0  0 - 1 %   Basophils Absolute 0.0  0.0 - 0.1 K/uL  COMPREHENSIVE METABOLIC PANEL   Collection Time    07/06/13  3:42 PM      Result Value Range   Sodium 133 (*) 135 - 145 mEq/L   Potassium 3.6  3.5 - 5.1 mEq/L   Chloride 99  96 - 112 mEq/L   CO2 15 (*) 19 - 32 mEq/L   Glucose, Bld 90  70 - 99 mg/dL   BUN 5 (*) 6 - 23 mg/dL   Creatinine, Ser 2.84  0.50 - 1.10 mg/dL   Calcium 9.8  8.4 - 13.2 mg/dL   Total Protein 6.8  6.0 -  8.3 g/dL   Albumin 2.9 (*) 3.5 - 5.2 g/dL   AST 15  0 - 37 U/L   ALT 19  0 - 35 U/L   Alkaline Phosphatase 168 (*) 39 - 117 U/L   Total Bilirubin 2.0 (*) 0.3 - 1.2 mg/dL   GFR calc non Af Amer >90  >90 mL/min   GFR calc Af Amer >90  >90 mL/min  LIPASE, BLOOD   Collection Time    07/06/13  3:42 PM      Result Value Range   Lipase 42  11 - 59 U/L  CULTURE, BLOOD (ROUTINE X 2)   Collection Time    07/06/13  4:19 PM      Result Value Range   Specimen Description BLOOD ARM RIGHT     Special Requests BOTTLES DRAWN AEROBIC ONLY 9CC     Culture  Setup Time       Value: 07/06/2013 23:18     Performed at [facility]   Culture       Value:        BLOOD CULTURE RECEIVED NO GROWTH TO DATE CULTURE WILL BE HELD FOR 5 DAYS BEFORE ISSUING A FINAL NEGATIVE REPORT     Performed at [facility]   Report Status PENDING    URINALYSIS, ROUTINE W REFLEX MICROSCOPIC   Collection Time    07/06/13  4:29 PM      Result Value Range   Color, Urine YELLOW  YELLOW   APPearance CLEAR  CLEAR   Specific Gravity, Urine 1.013  1.005 - 1.030   pH 5.5  5.0 - 8.0   Glucose, UA  NEGATIVE  NEGATIVE mg/dL   Hgb urine dipstick NEGATIVE  NEGATIVE   Bilirubin Urine SMALL (*) NEGATIVE   Ketones, ur >80 (*) NEGATIVE mg/dL   Protein, ur NEGATIVE  NEGATIVE mg/dL   Urobilinogen, UA 1.0  0.0 - 1.0 mg/dL   Nitrite NEGATIVE  NEGATIVE   Leukocytes, UA MODERATE (*) NEGATIVE  URINE MICROSCOPIC-ADD ON   Collection Time    07/06/13  4:29 PM      Result Value Range   Squamous Epithelial / LPF RARE  RARE   WBC, UA 7-10  <3 WBC/hpf   RBC / HPF 0-2  <3 RBC/hpf   Bacteria, UA FEW (*) RARE   Urine-Other MUCOUS PRESENT    GC/CHLAMYDIA PROBE AMP   Collection Time    07/06/13  6:00 PM      Result Value Range   CT Probe RNA NEGATIVE  NEGATIVE   GC Probe RNA NEGATIVE  NEGATIVE  WET PREP, GENITAL   Collection Time    07/06/13  6:50 PM      Result Value Range   Yeast Wet Prep HPF POC NONE SEEN  NONE SEEN   Trich, Wet Prep NONE SEEN  NONE SEEN   Clue Cells Wet Prep HPF POC NONE SEEN  NONE SEEN   WBC, Wet Prep HPF POC FEW (*) NONE SEEN  GLUCOSE, CAPILLARY   Collection Time    07/06/13 10:22 PM      Result Value Range   Glucose-Capillary 150 (*) 70 - 99 mg/dL  CBC WITH DIFFERENTIAL   Collection Time    07/07/13  5:25 AM      Result Value Range   WBC 5.2  4.0 - 10.5 K/uL   RBC 3.24 (*) 3.87 - 5.11 MIL/uL   Hemoglobin 9.5 (*) 12.0 - 15.0 g/dL   HCT 16.1 (*) 09.6 -  46.0 %   MCV 81.2  78.0 - 100.0 fL   MCH 29.3  26.0 - 34.0 pg   MCHC 36.1 (*) 30.0 - 36.0 g/dL   RDW 16.1  09.6 - 04.5 %   Platelets 203  150 - 400 K/uL   Neutrophils Relative % 68  43 - 77 %   Neutro Abs 3.5  1.7 - 7.7 K/uL   Lymphocytes Relative 15  12 - 46 %   Lymphs Abs 0.8  0.7 - 4.0 K/uL   Monocytes Relative 16 (*) 3 - 12 %   Monocytes Absolute 0.8  0.1 - 1.0 K/uL   Eosinophils Relative 1  0 - 5 %   Eosinophils Absolute 0.1  0.0 - 0.7 K/uL   Basophils Relative 0  0 - 1 %   Basophils Absolute 0.0  0.0 - 0.1 K/uL  BASIC METABOLIC PANEL   Collection Time    07/07/13  5:25 AM      Result Value Range    Sodium 133 (*) 135 - 145 mEq/L   Potassium 3.7  3.5 - 5.1 mEq/L   Chloride 107  96 - 112 mEq/L   CO2 17 (*) 19 - 32 mEq/L   Glucose, Bld 193 (*) 70 - 99 mg/dL   BUN 7  6 - 23 mg/dL   Creatinine, Ser 4.09  0.50 - 1.10 mg/dL   Calcium 8.8  8.4 - 81.1 mg/dL   GFR calc non Af Amer >90  >90 mL/min   GFR calc Af Amer >90  >90 mL/min  GLUCOSE, CAPILLARY   Collection Time    07/07/13  6:24 AM      Result Value Range   Glucose-Capillary 160 (*) 70 - 99 mg/dL    Imaging Studies:      Medications:  Scheduled . cefTRIAXone (ROCEPHIN)  IV  1 g Intravenous Q12H  . docusate sodium  100 mg Oral Daily  . insulin aspart  20 Units Subcutaneous TID WC  . insulin NPH  15 Units Subcutaneous QAC breakfast  . insulin NPH  17 Units Subcutaneous QHS  . prenatal multivitamin  1 tablet Oral Q1200   I have reviewed the patient's current medications.  ASSESSMENT: Patient Active Problem List   Diagnosis Date Noted  . Acute pyelonephritis in third trimester, antepartum 07/06/2013  . Bacteremia due to Escherichia coli 05/01/2013  . Diabetes mellitus, antepartum 02/20/2013  . Benign essential hypertension antepartum 02/20/2013  . Obesity complicating peripregnancy, antepartum 02/20/2013  . Supervision of high-risk pregnancy 01/19/2013  . 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 Morbid obesity with BMI of 50.0-59.9, adult 01/20/2012  . DM (diabetes mellitus), type 2, uncontrolled 12/30/2006  . HYPERTENSION, BENIGN ESSENTIAL 12/30/2006    PLAN: 27 yo G2P1 at 36 w admitted for presumed pyelo - Continue IV antibiotics - BP well controlled for now - Will increase bedtime NPH due to fasting 160 - Continue current care - D/C planning in am if afebrile  Ruthetta Koopmann 07/07/2013,10:04 AM

## 2013-07-07 NOTE — Progress Notes (Signed)
Antenatal Nutrition Assessment:  Currently  31 1/[redacted] weeks gestation, with pyelonephritis, IDGDM, obesity Height  62" Weight 290 lbs pre-pregnancy weight 283 lbs.Pre-pregnancy  BMI 51.6  IBW 110 lbs  Total weight gain 7 lbs. Weight gain goals 11-20 lbs.   Estimated needs: 21-2300 kcal/day, 77-87 grams protein/day, 2.4 liters fluid/day  Past Medical History  Diagnosis Date  . Diabetes mellitus   . Hypertension   . Nephrolithiasis   . 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 Hepatic steatosis 01/17/2012  . Cholelithiasis 01/17/2012  . Headache(784.0)   . Leukemia, acute monocytic, in remission   . Bacteremia associated with IV line 05/01/2013    Port-a-cath infection, removed 8/6 by IR. Ecoli grew out in cultures. Sent home with Ceftriaxone to complete 14 day course.   . Bacteremia due to Escherichia coli 05/01/2013    Port-a-cath infection, removed 8/6 by IR. Ecoli grew out in cultures. Sent home with Ceftriaxone to complete 14 day course.    Gestational Carbohydrate modified diet Current diet prescription will provide for increased needs.  nutrition related labs CBG (last 3)   Recent Labs  07/06/13 2222 07/07/13 0624 07/07/13 1123  GLUCAP 150* 160* 122*     Nutrition Dx: Increased nutrient needs r/t pregnancy and fetal growth requirements aeb [redacted] weeks gestation.  No educational needs assessed at this time. Pt followed in [facility].Odis Luster LDN Neonatal Nutrition Support Specialist Pager [phone]

## 2013-07-08 ENCOUNTER — Ambulatory Visit (HOSPITAL_COMMUNITY)
Admit: 2013-07-08 | Discharge: 2013-07-08 | Disposition: A | Discharge status: Home / Self Care | Payer: Medicaid Other | Attending: Family Medicine | Admitting: Family Medicine

## 2013-07-08 DIAGNOSIS — R7881 Bacteremia: Secondary | ICD-10-CM

## 2013-07-08 DIAGNOSIS — A498 Other bacterial infections of unspecified site: Secondary | ICD-10-CM

## 2013-07-08 LAB — GLUCOSE, CAPILLARY: Glucose-Capillary: 100 mg/dL — ABNORMAL HIGH (ref 70–99)

## 2013-07-08 NOTE — Progress Notes (Signed)
Patient ID: Melissa Washington, female   DOB: 1985/10/22, 27 y.o.   MRN: [id number] FACULTY PRACTICE ANTEPARTUM(COMPREHENSIVE) NOTE  Melissa Washington is a 27 y.o. G2P1001 at [redacted]w[redacted]d by best clinical estimate who is admitted for pyelonephritis. Course complicated by bacteremia again.  Previous E.coli bacteremia in 8/14 with Port-A-Cath in place.  It was removed, followed by negative blood cultures, placement of PICC line 14 days of IV Rocephin at home followed by removal of PICC line.  Now with 30,000 K E.coli on cath specimen U/A and + blood culture with GNR.  Also, with h/o acute leukemia, s/p chemo at [facility] in last 1 year.    Fetal presentation is unsure. Length of Stay:  2  Days  Subjective: Feels some better than on admission. Patient reports the fetal movement as active. Patient reports uterine contraction  activity as irregular, every 30 minutes. Patient reports  vaginal bleeding as none. Patient describes fluid per vagina as None.  Vitals:  Blood pressure 113/58, pulse 93, temperature 98.5 F (36.9 C), temperature source Axillary, resp. rate 18, height 5\' 2"  (1.575 m), weight 290 lb (131.543 kg), last menstrual period 12/26/2012, SpO2 98.00%. Physical Examination:  General appearance - alert, well appearing, and in no distress Heart - normal rate and regular rhythm, systolic murmur 2/6 at 2nd left intercostal space and at 2nd right intercostal space Abdomen - soft, nontender, nondistended, no masses or organomegaly Back exam - tenderness noted right CVA-mild Fundal Height:  size equals dates Extremities: extremities normal, atraumatic, no cyanosis or edema  Membranes:intact  Fetal Monitoring:  Baseline: 120 bpm, Variability: Good {> 6 bpm), Accelerations: Reactive and Decelerations: Variable: occaional and mild  Labs:  Results for orders placed during the hospital encounter of 07/06/13 (from the past 24 hour(s))  GLUCOSE, CAPILLARY   Collection Time    07/07/13 11:23 AM      Result  Value Range   Glucose-Capillary 122 (*) 70 - 99 mg/dL  GLUCOSE, CAPILLARY   Collection Time    07/07/13  5:53 PM      Result Value Range   Glucose-Capillary 124 (*) 70 - 99 mg/dL   Comment 1 Notify RN     Comment 2 Documented in Chart    GLUCOSE, CAPILLARY   Collection Time    07/07/13 10:28 PM      Result Value Range   Glucose-Capillary 88  70 - 99 mg/dL  GLUCOSE, CAPILLARY   Collection Time    07/08/13  6:30 AM      Result Value Range   Glucose-Capillary 100 (*) 70 - 99 mg/dL     Medications:  Scheduled . cefTRIAXone (ROCEPHIN)  IV  1 g Intravenous Q12H  . docusate sodium  100 mg Oral Daily  . insulin aspart  20 Units Subcutaneous TID WC  . insulin NPH  15 Units Subcutaneous QAC breakfast  . insulin NPH  20 Units Subcutaneous QHS  . prenatal multivitamin  1 tablet Oral Q1200   I have reviewed the patient's current medications.  ASSESSMENT: Patient Active Problem List   Diagnosis Date Noted  . Supervision of high-risk pregnancy 01/19/2013    Priority: High  . Diabetes mellitus, antepartum 02/20/2013    Priority: Medium  . Benign essential hypertension antepartum 02/20/2013    Priority: Medium  . Obesity complicating peripregnancy, antepartum 02/20/2013    Priority: Medium  . Acute pyelonephritis in third trimester, antepartum 07/06/2013  . Bacteremia due to Escherichia coli 05/01/2013  . 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  Morbid obesity with BMI of 50.0-59.9, adult 01/20/2012  . DM (diabetes mellitus), type 2, uncontrolled 12/30/2006  . HYPERTENSION, BENIGN ESSENTIAL 12/30/2006    PLAN: ID consult.  Per discussion with Dr. Daiva Eves will order 2-D cardiac ECHO to r/o endocarditis (may need TEE) and MRI of abdomen to r/o abcess.  Continue Rocephin for now-awaiting sensitivities. ID to see pt. This pm Discussed at length with pt.  Hildegarde Dunaway S 07/08/2013,10:28 AM

## 2013-07-08 NOTE — Progress Notes (Signed)
  Echocardiogram 2D Echocardiogram has been performed.  Kandis Henry FRANCES 07/08/2013, 4:26 PM

## 2013-07-09 DIAGNOSIS — O98819 Other maternal infectious and parasitic diseases complicating pregnancy, unspecified trimester: Secondary | ICD-10-CM

## 2013-07-09 LAB — TYPE AND SCREEN
ABO/RH(D): B POS
Antibody Screen: NEGATIVE

## 2013-07-09 LAB — URINE CULTURE: Colony Count: 30000

## 2013-07-09 LAB — CULTURE, BLOOD (ROUTINE X 2)

## 2013-07-09 LAB — CULTURE, BETA STREP (GROUP B ONLY)

## 2013-07-09 LAB — OB RESULTS CONSOLE GBS: GBS: NEGATIVE

## 2013-07-09 MED ORDER — INFLUENZA VAC SPLIT QUAD 0.5 ML IM SUSP
0.5000 mL | INTRAMUSCULAR | Status: AC
  Administered 2013-07-10: 0.5 mL via INTRAMUSCULAR
  Filled 2013-07-09: qty 0.5

## 2013-07-09 NOTE — Consult Note (Addendum)
[facility]    Date of Admission:  07/06/2013  Date of Consult:  07/09/2013  Reason for Consult: recurrent Escherichia coli bacteremia. Referring Physician: Dr Shawnie Pons, Dr. Macon Large     HPI: Melissa Washington is an 27 y.o. female with hx of monocytic leukemia sp chemotherapy and in remission since fall of 2013, currently 31 week G2P1001 with 2nd child. Her redness he has been complicated by Escherichia coli bacteremia in August which was thought to be associated with her Port-A-Cath which was still in place. Note urine cultures at that time and only grown diphtheroids while her blood cultures had grown Escherichia coli. Port-A-Cath was removed though I do not see if any cultures are done of the catheter tip. In any case she finished 2 weeks of ceftriaxone intravenously, and was doing well when she saw my partner Dr. Drue Second in followup in the infectious disease clinic. In mid-September she presented to her obstetrician with symptoms of fever and malaise and was diagnosed with a urinary tract infection with Escherichia coli having grown greater than 100,000 colony-forming units of Escherichia coli from urine in mid-September. Blood cultures were not done at that point in time. Patient was sent home on Macrobid to which the organism was sensitive and she states that she felt better while on the Macrobid. She then presented on October 16 with fever nausea vomiting and mild headache. She also is complaining of some flank pain. Her blood cultures again growing a pansensitive Escherichia coli in 2 out of 2 cultures. She is also growing Escherichia coli from urine but only a colony count of 40,000 colony-forming units.  Yesterday she underwent iMRI of the abdomen to exclude intra-abdominal abscess and the MRI of the abdomen did not find any evidence of intra-abdominal infection or perinephric abscess whatsoever. She also had a 2-D echocardiogram which was suboptimal due to patient's  body habitus but nonetheless failed to show any evidence of endocarditis. He had repeat blood cultures done yesterday to ensure clearance of her bacteremia. Today she is feeling much better.    Past Medical History  Diagnosis Date  . Diabetes mellitus   . Hypertension   . Nephrolithiasis   . 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 Hepatic steatosis 01/17/2012  . Cholelithiasis 01/17/2012  . Headache(784.0)   . Leukemia, acute monocytic, in remission   . Bacteremia associated with IV line 05/01/2013    Port-a-cath infection, removed 8/6 by IR. Ecoli grew out in cultures. Sent home with Ceftriaxone to complete 14 day course.   . Bacteremia due to Escherichia coli 05/01/2013    Port-a-cath infection, removed 8/6 by IR. Ecoli grew out in cultures. Sent home with Ceftriaxone to complete 14 day course.     Past Surgical History  Procedure Laterality Date  . Portacath placement      [facility]  . Cholecystectomy  01/31/2012    Procedure: LAPAROSCOPIC CHOLECYSTECTOMY WITH INTRAOPERATIVE CHOLANGIOGRAM;  Surgeon: Clovis Pu. Cornett, MD;  Location: [facility] OR;  Service: General;  Laterality: N/A;  ergies:   Allergies  Allergen Reactions  . Ultram [Tramadol Hcl] Nausea And Vomiting     Medications: I have reviewed patients current medications as documented in Epic Anti-infectives   Start     Dose/Rate Route Frequency Ordered Stop   07/07/13 0500  cefTRIAXone (ROCEPHIN) 1 g in dextrose 5 % 50 mL IVPB     1 g 100 mL/hr over 30 Minutes Intravenous Every 12 hours 07/06/13  1747     07/06/13 1645  cefTRIAXone (ROCEPHIN) 1 g in dextrose 5 % 50 mL IVPB     1 g 100 mL/hr over 30 Minutes Intravenous  Once 07/06/13 1635 07/06/13 1725      Social History:  reports that she has never smoked. She has never used smokeless tobacco. She reports that she does not drink alcohol or use illicit drugs.  Family History  Problem Relation Age of Onset  . Kidney disease Mother   . Hypertension Mother     . Epilepsy Mother   . Sleep apnea Mother   . Kidney disease Maternal Grandmother   . Diabetes Maternal Grandmother   . Diabetes Father     As in HPI and primary teams notes otherwise 12 point review of systems is negative  Blood pressure 97/47, pulse 89, temperature 98.2 F (36.8 C), temperature source Oral, resp. rate 19, height 5\' 2"  (1.575 m), weight 290 lb (131.543 kg), last menstrual period 12/26/2012, SpO2 98.00%. General: Alert and awake, oriented x3, not in any acute distress. HEENT: anicteric sclera, pupils reactive to light and accommodation, EOMI, oropharynx clear and without exudate CVS regular rate, normal r,  no murmur rubs or gallops Chest: clear to auscultation bilaterally, no wheezing, rales or rhonchi Abdomen: soft nontender, gravid, obese Extremities: no  clubbing or edema noted bilaterally Skin: no rashes Neuro: nonfocal, strength and sensation intact   Results for orders placed during the hospital encounter of 07/06/13 (from the past 48 hour(s))  GLUCOSE, CAPILLARY     Status: Abnormal   Collection Time    07/07/13  5:53 PM      Result Value Range   Glucose-Capillary 124 (*) 70 - 99 mg/dL   Comment 1 Notify RN     Comment 2 Documented in Chart    GLUCOSE, CAPILLARY     Status: None   Collection Time    07/07/13 10:28 PM      Result Value Range   Glucose-Capillary 88  70 - 99 mg/dL  GLUCOSE, CAPILLARY     Status: Abnormal   Collection Time    07/08/13  6:30 AM      Result Value Range   Glucose-Capillary 100 (*) 70 - 99 mg/dL  GLUCOSE, CAPILLARY     Status: Abnormal   Collection Time    07/08/13 12:06 PM      Result Value Range   Glucose-Capillary 112 (*) 70 - 99 mg/dL      Component Value Date/Time   SDES VAGINAL/RECTAL 07/06/2013 1756   SPECREQUEST NONE 07/06/2013 1756   CULT  Value: NO GROUP B STREP (S.AGALACTIAE) ISOLATED Performed at [facility] 07/06/2013 1756   REPTSTATUS 07/09/2013 FINAL 07/06/2013 1756   Mr Abdomen Wo  Contrast  07/08/2013   CLINICAL DATA:  Fever and elevated white blood cell count. Thirty-one weeks pregnant.  EXAM: MRI ABDOMEN WITHOUT CONTRAST  TECHNIQUE: Multiplanar, multisequence MR imaging was performed. No intravenous contrast was administered.  COMPARISON:  None.  FINDINGS: Exam limited by body habitus. The solid abdominal organs appear normal. There is mild bilateral hydronephrosis likely related to pregnancy. No MR findings to suggest pyelonephritis or renal abscess. No definite obstructing ureteral calculi.  The pelvis is difficult to evaluate. I do not see any obvious fluid-filled tubular structure to suggest appendicitis.  IMPRESSION: Limited examination. No findings to suggest pyelonephritis or renal abscess.   Electronically Signed   By: Loralie Champagne M.D.   On: 07/08/2013 18:20  Impression/Recommendation  27 year old hx of monocytic leukemia sp chemotherapy and in remission since fall of 2013, currently 31 week G2P1001with recurrent E coli bacteremia.  #1 Recurrent E coli bacteremia: I suspect that he source was urinary. She had recent UTI with E coli with identical susceptibility pattern. Perhaps she had more than an uncomplicated UTI at that time and the macrobid was inadequete and being effective beyond the bladder and she then seeded her urine. She has E coli again in urine but only at 30K CFU. Ordinarily I would expect pt with raging UTI that caused bacteremia to have >100K CFU of organism in the urine. I actually have 2 pts with this same presentation of low e coli in urine and full blown e coli in blood drawn same day so perhaps could be lab error with re to the CFU count?  Certainly we have excluded intrabdominal infection.  I WOULD NOT PUSH for TEE in this pt since it would only change the duration of therapy in firming up a need for 4 weeks of parenteral therapy vs 2 weeks and I would not want to take on risk of even mild sedation for the procedure in this pregnant  pt. IE is not common with E coli but does occur and this case of recurrent bacteremia should make [place] think about it  --I would recommend placing PICC line tomorrow, --plan on 4 weeks of IV rocephin (per pt request for more protracted therapy), I certainly want to make sure she at least does get 2 weeks from date of first negative culture (yesterday) --she will need weekly cbc, and bmp drawn and faxed to Dr. Daiva Eves @ [phone]  I will ensure that she follows up with [place] in the next 2 weeks to consider going to just 2 wks vs the full 4  I would also consider having her stay on macrobid post IV abx to try to prevent recurrence at least while she is pregnant  I spent greater than 45 minutes with the patient including greater than 50% of time in face to face counsel of the patient and in coordination of their care.     Thank you so much for this interesting consult  [facility] [phone] (pager) [phone] (office) 07/09/2013, 12:09 PM  Paulette Blanch Dam 07/09/2013, 12:09 PM

## 2013-07-09 NOTE — Progress Notes (Signed)
Patient ID: Melissa Washington, female   DOB: 13-Dec-1985, 27 y.o.   MRN: [id number] FACULTY PRACTICE ANTEPARTUM(COMPREHENSIVE) NOTE  Melissa Washington is a 27 y.o. G2P1001 at [redacted]w[redacted]d by best clinical estimate who is admitted for urosepsis and bacteremia.  Had +ve blood cultures and after speaking with ID had normal 2D ECHO and MRI of abd/pelvis (though pelvic views limited), and normal valves (although technically difficult study) .   Fetal presentation is unsure. Length of Stay:  3  Days  Subjective: Sleeping fairly soundly this am. Patient reports the fetal movement as active. Patient reports uterine contraction  activity as none. Patient reports  vaginal bleeding as none. Patient describes fluid per vagina as None.  Vitals:  Blood pressure 115/62, pulse 82, temperature 98.5 F (36.9 C), temperature source Oral, resp. rate 20, height 5\' 2"  (1.575 m), weight 290 lb (131.543 kg), last menstrual period 12/26/2012, SpO2 98.00%. Physical Examination:  General appearance - alert, well appearing, and in no distress Abdomen - soft, nontender, nondistended, no masses or organomegaly Fundal Height:  size equals dates Extremities: extremities normal, atraumatic, no cyanosis or edema  Membranes:intact  Fetal Monitoring:  Baseline: 130 bpm, Variability: Good {> 6 bpm), Accelerations: Reactive and Decelerations: Absent  Labs:  Results for orders placed during the hospital encounter of 07/06/13 (from the past 24 hour(s))  GLUCOSE, CAPILLARY   Collection Time    07/08/13 12:06 PM      Result Value Range   Glucose-Capillary 112 (*) 70 - 99 mg/dL    Imaging Studies:      Medications:  Scheduled . cefTRIAXone (ROCEPHIN)  IV  1 g Intravenous Q12H  . docusate sodium  100 mg Oral Daily  . insulin aspart  20 Units Subcutaneous TID WC  . insulin NPH  15 Units Subcutaneous QAC breakfast  . insulin NPH  20 Units Subcutaneous QHS  . prenatal multivitamin  1 tablet Oral Q1200   I have reviewed the  patient's current medications.  ASSESSMENT: Patient Active Problem List   Diagnosis Date Noted  . Supervision of high-risk pregnancy 01/19/2013    Priority: High  . Acute pyelonephritis in third trimester, antepartum 07/06/2013    Priority: Medium  . Bacteremia due to Escherichia coli 05/01/2013    Priority: Medium  . Diabetes mellitus, antepartum 02/20/2013    Priority: Medium  . Benign essential hypertension antepartum 02/20/2013    Priority: Medium  . Obesity complicating peripregnancy, antepartum 02/20/2013    Priority: Medium  . 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 Morbid obesity with BMI of 50.0-59.9, adult 01/20/2012  . DM (diabetes mellitus), type 2, uncontrolled 12/30/2006  . HYPERTENSION, BENIGN ESSENTIAL 12/30/2006    PLAN: Continue IV Abx.  Await ID for recommendations about length of abx. Repeat blood cultures pending.  Kynleigh Artz S 07/09/2013,7:08 AM

## 2013-07-10 LAB — GLUCOSE, CAPILLARY
Glucose-Capillary: 78 mg/dL (ref 70–99)
Glucose-Capillary: 110 mg/dL — ABNORMAL HIGH (ref 70–99)
Glucose-Capillary: 84 mg/dL (ref 70–99)
Glucose-Capillary: 84 mg/dL (ref 70–99)
Glucose-Capillary: 97 mg/dL (ref 70–99)

## 2013-07-10 MED ORDER — DEXTROSE 5 % IV SOLN: 1.0000 g | Freq: Two times a day (BID) | INTRAVENOUS | Status: DC

## 2013-07-10 MED ORDER — SODIUM CHLORIDE 0.9 % IJ SOLN: 10.0000 mL | INTRAMUSCULAR | Status: DC | PRN

## 2013-07-10 MED ORDER — SODIUM CHLORIDE 0.9 % IJ SOLN: 10.0000 mL | Freq: Two times a day (BID) | INTRAMUSCULAR | Status: DC

## 2013-07-10 NOTE — Progress Notes (Signed)
[facility]  Patient Status:   Returning pt to [facility]/not active at time of admission, however, has been with [facility] in August of 2014 for IV ABX at home.  [facility] is providing the following services:   HH RN and [facility] services for home IV ABX x 4 weeks this admission.  [facility] has met with pt today in preparation for DC home today for [facility] RN visit tonight for p.m. Dose of IV ABX.  [facility] hospital coordinator team will follow to ensure transition to home.  If patient discharges after hours, please call [phone].   Sedalia Muta 07/10/2013, 11:20 AM

## 2013-07-10 NOTE — Progress Notes (Signed)
Ur chart review completed.  

## 2013-07-10 NOTE — Progress Notes (Signed)
Taxi voucher approved by H. Rife/CSW Director and provided to patient.

## 2013-07-10 NOTE — Care Management Note (Addendum)
    Page 1 of 2   07/10/2013     11:57:10 AM   CARE MANAGEMENT NOTE 07/10/2013  Patient:  Melissa Washington, Melissa Washington   Account Number:  [id number]  Date Initiated:  07/10/2013  Documentation initiated by:  Emilio Math  Subjective/Objective Assessment:     Action/Plan:   IV Rocephin every 12 hour for 4 weeks via midline with Home Health Agency   Anticipated DC Date:  07/10/2013   Anticipated DC Plan:  HOME W HOME HEALTH SERVICES  In-house referral  Clinical Social Worker      DC Associate Professor  CM consult      [facility] Choice  HOME HEALTH   Choice offered to / List presented to:  C-1 Patient   DME arranged  IV PUMP/EQUIPMENT      DME agency  [facility]     [facility] arranged  HH-1 RN      [facility] agency  [facility]   Status of service:  Completed, signed off  Discharge Disposition:  HOME W HOME HEALTH SERVICES  Per UR Regulation:  Reviewed for med. necessity/level of care/duration of stay  If discussed at Long Length of Stay Meetings, dates discussed:    Comments:  07-10-13 10: 33 am - CM consult for IV Home Health needs for home use.  Patient has a + blood culture and will need home IV Rocephin for 4 weeks at home.  IV Team has placed a Midline in patient to be discharged with for IV home use. List provided to patient with choices of Home Health Agencies.  Patient chose [facility].  Patient stated she has used [facility] in the past. Referral called to Jeri Modena with [facility]. 1st visit will start this pm with [facility]. Phone number of [facility] given to pateint.  Patient verbalized undertanding and in agreement with plan. RN states patient needs ride for home. CSW- consulted and taxi cab voucher provided for discharge.

## 2013-07-10 NOTE — Progress Notes (Signed)
Peripherally Inserted Central Catheter/Midline Placement  The IV Nurse has discussed with the patient and/or persons authorized to consent for the patient, the purpose of this procedure and the potential benefits and risks involved with this procedure.  The benefits include less needle sticks, lab draws from the catheter and patient may be discharged home with the catheter.  Risks include, but not limited to, infection, bleeding, blood clot (thrombus formation), and puncture of an artery; nerve damage and irregular heat beat.  Alternatives to this procedure were also discussed.  PICC/Midline Placement Documentation  PICC / Midline Single Lumen 05/01/13 PICC Left Basilic (Active)       Lisabeth Devoid 07/10/2013, 9:44 AM Consent obtained by Merleen Milliner, RN, CRNI

## 2013-07-10 NOTE — Progress Notes (Signed)
FACULTY PRACTICE ANTEPARTUM(COMPREHENSIVE) NOTE  Melissa Washington is a 27 y.o. G2P1001 at [redacted]w[redacted]d  who is admitted for ?pyelo, now found to have + blood Cult x 2 E Coli, S All abx. Scheduled for PICC, midlength today, then home care with IV Rocephin x 4 wk.   Fetal presentation is unsure. Length of Stay:  4  Days  Subjective: Pt stable , afebrile, denies fever chills Patient reports the fetal movement as active. Patient reports uterine contraction  activity as none. Patient reports  vaginal bleeding as none. Patient describes fluid per vagina as None.  Vitals:  Blood pressure 96/51, pulse 83, temperature 98.2 F (36.8 C), temperature source Oral, resp. rate 20, height 5\' 2"  (1.575 m), weight 131.543 kg (290 lb), last menstrual period 12/26/2012, SpO2 98.00%. Physical Examination:  General appearance - alert, well appearing, and in no distress Heart - normal rate and regular rhythm Abdomen - soft, nontender, nondistended Fundal Height:  size equals dates Cervical Exam: Not evaluated. . Extremities: extremities normal, atraumatic, no cyanosis or edema and Homans sign is negative, no sign of DVT with DTRs 2+ bilaterally Membranes:intact  Fetal Monitoring:  reactive  Labs:  Results for orders placed during the hospital encounter of 07/06/13 (from the past 24 hour(s))  TYPE AND SCREEN   Collection Time    07/09/13 10:55 PM      Result Value Range   ABO/RH(D) B POS     Antibody Screen NEG     Sample Expiration 07/12/2013    ABO/RH   Collection Time    07/09/13 10:55 PM      Result Value Range   ABO/RH(D) B POS      Imaging Studies:     C Medications:  Scheduled . cefTRIAXone (ROCEPHIN)  IV  1 g Intravenous Q12H  . docusate sodium  100 mg Oral Daily  . influenza vac split quadrivalent PF  0.5 mL Intramuscular Tomorrow-1000  . insulin aspart  20 Units Subcutaneous TID WC  . insulin NPH  15 Units Subcutaneous QAC breakfast  . insulin NPH  20 Units Subcutaneous QHS  . prenatal  multivitamin  1 tablet Oral Q1200   I have reviewed the patient's current medications.  ASSESSMENT: Patient Active Problem List   Diagnosis Date Noted  . Acute pyelonephritis in third trimester, antepartum 07/06/2013  . Bacteremia due to Escherichia coli 05/01/2013  . Diabetes mellitus, antepartum 02/20/2013  . Benign essential hypertension antepartum 02/20/2013  . Obesity complicating peripregnancy, antepartum 02/20/2013  . Supervision of high-risk pregnancy 01/19/2013  . 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 Morbid obesity with BMI of 50.0-59.9, adult 01/20/2012  . DM (diabetes mellitus), type 2, uncontrolled 12/30/2006  . HYPERTENSION, BENIGN ESSENTIAL 12/30/2006    PLAN: PICC line today, then discharge to home care, with home health to arrange Rocephin 1 gm q 24 x 4 wk. Weekly cbc, cmp , fax to Dr Synthia Innocent, [phone] Discharge later today Ebenezer Mccaskey V 07/10/2013,7:28 AM

## 2013-07-10 NOTE — Discharge Summary (Signed)
Physician Discharge Summary  Patient ID: Melissa Washington MRN: [id number] DOB/AGE: 28-Feb-1986 27 y.o.  Admit date: 07/06/2013 Discharge date: 07/10/2013  Admission Diagnoses: fevers, abdominal pain, nausea, vomiting.   Discharge Diagnoses:  Principal Problem:   Bacteremia due to Escherichia coli Active Problems:   DM (diabetes mellitus), type 2, uncontrolled   HYPERTENSION, BENIGN ESSENTIAL   Morbid obesity with BMI of 50.0-59.9, adult   Acute promyelocytic leukemia in remission   Diabetes mellitus, antepartum   Benign essential hypertension antepartum   Obesity complicating peripregnancy, antepartum   Acute pyelonephritis in third trimester, antepartum   Discharged Condition: good  Hospital Course: For complete details of hospital admission, please see H&P done by Dr. Ike Bene 07/06/13.  Briefly, Melissa Washington is a 27 yo G2P1001 @[redacted]w[redacted]d  who presented to the ED with nausea, vomiting and fever.  Previously this pregnancy, she had a hx of pyelonephritis and e coli bacteremia from an infected port-a-cath which was pulled at that time.  She was admitted and monitored and blood cultures grew out e coli x2 with a UA that was + for leukocyte esterase and urine cutlure positive of E. Coli.  Given this, it was felt she was bacteremic from a urinary source. She was placed on rocephin.  ID was consulted on 07/09/13 and recommended 4 week of IV rocephin.  A PICC line was placed on 07/10/13 and home health was arranged for BID rocephin and weekly CBC and BMP.  She will f/u next week in our OB clinic.  PT notified of reasons to return sooner and voices understanding.    Consults: ID  Significant Diagnostic Studies: MRI of abdomen- no evidence of pyelonephritis or renal abscess  Treatments: antibiotics: ceftriaxone  Discharge Exam: Blood pressure 114/56, pulse 83, temperature 98.2 F (36.8 C), temperature source Oral, resp. rate 20, height 5\' 2"  (1.575 m), weight 131.543 kg (290 lb), last menstrual  period 12/26/2012, SpO2 98.00%. General appearance: alert, cooperative and no distress Throat: lips, mucosa, and tongue normal; teeth and gums normal Resp: normal effort Cardio: regular rate and rhythm GI: soft, NT/ND, no CVA tenderness. Gravid Extremities: extremities normal, atraumatic, no cyanosis or edema Skin: Skin color, texture, turgor normal. No rashes or lesions  Disposition: 01-Home or Self Care      Discharge Orders   Future Appointments Provider Department Dept Phone   07/24/2013 1:30 PM [facility] Only [facility] [phone]   07/26/2013 9:30 AM [facility] [place] [facility] [phone]   Future Orders Complete By Expires   Face-to-face encounter (required for Medicare/Medicaid patients)  As directed    Scheduling Instructions:     Have home health RN to do midline site care per protocol Please draw weekly CBC and BMP on pt while on antibiotics roceping 1g q12hours x 4 weeks.   Comments:     I Harmonie Verrastro L certify that this patient is under my care and that I, or a nurse practitioner or physician's assistant working with me, had a face-to-face encounter that meets the physician face-to-face encounter requirements with this patient on 07/10/2013. The encounter with the patient was in whole, or in part for the following medical condition(s) which is the primary reason for home health care (List medical condition): pyelonephritis in pregnancy and need for IV antibiotics for the next 4 weeks due to bacteremia.   Questions:     The encounter with the patient was in whole, or in part, for the following medical condition, which  is the primary reason for home health care:  pyelonephritis in pregnancy   I certify that, based on my findings, the following services are medically necessary home health services:  Nursing   My clinical findings support the need for the above services:  OTHER SEE COMMENTS   Further, I certify  that my clinical findings support that this patient is homebound due to:  Unable to leave home safely without assistance   Reason for Medically Necessary Home Health Services:  Skilled Nursing- Lab Monitoring Requiring Frequent Medication Adjustment   Skilled Nursing- Administration and Training of Injectable Medication       Medication List         dextrose 5 % SOLN 50 mL with cefTRIAXone 1 G SOLR 1 g  Inject 1 g into the vein every 12 (twelve) hours.     insulin aspart 100 UNIT/ML injection  Commonly known as:  novoLOG  Inject 20 Units into the skin 3 (three) times daily with meals.     insulin NPH 100 UNIT/ML injection  Commonly known as:  HUMULIN N,NOVOLIN N  Inject 15-17 Units into the skin 2 (two) times daily. Inject 15 units in the morning and 17 units at night.     multivitamin-prenatal 27-0.8 MG Tabs tablet  Take 1 tablet by mouth daily at 12 noon.       Follow-up Information   Follow up with [facility] In 2 weeks. (for a hospital follow up)    Specialty:  Obstetrics and Gynecology   Contact information:   [address] [place] [address] [phone]      Signed: Vale Haven 07/10/2013, 2:50 PM

## 2013-07-12 NOTE — H&P (Signed)
Attestation of Attending Supervision of Obstetric Fellow: Evaluation and management procedures were performed by the Obstetric Fellow under my supervision and collaboration.  I have reviewed the Obstetric Fellow's note and chart, and I agree with the management and plan.  UGONNA  ANYANWU, MD, FACOG Attending Obstetrician & Gynecologist Faculty Practice, [facility]   

## 2013-07-14 LAB — CULTURE, BLOOD (ROUTINE X 2): Culture: NO GROWTH

## 2013-07-16 ENCOUNTER — Encounter (HOSPITAL_COMMUNITY): Payer: Self-pay | Admitting: Emergency Medicine

## 2013-07-16 ENCOUNTER — Emergency Department (HOSPITAL_COMMUNITY)
Admission: EM | Admit: 2013-07-16 | Discharge: 2013-07-16 | Disposition: A | Discharge status: Home / Self Care | Payer: Medicaid Other | Attending: Emergency Medicine | Admitting: Emergency Medicine

## 2013-07-16 DIAGNOSIS — IMO0002 Reserved for concepts with insufficient information to code with codable children: Secondary | ICD-10-CM | POA: Insufficient documentation

## 2013-07-16 DIAGNOSIS — O9989 Other specified diseases and conditions complicating pregnancy, childbirth and the puerperium: Secondary | ICD-10-CM | POA: Insufficient documentation

## 2013-07-16 DIAGNOSIS — E119 Type 2 diabetes mellitus without complications: Secondary | ICD-10-CM | POA: Insufficient documentation

## 2013-07-16 DIAGNOSIS — Z79899 Other long term (current) drug therapy: Secondary | ICD-10-CM | POA: Insufficient documentation

## 2013-07-16 DIAGNOSIS — Z8719 Personal history of other diseases of the digestive system: Secondary | ICD-10-CM | POA: Insufficient documentation

## 2013-07-16 DIAGNOSIS — Z862 Personal history of diseases of the blood and blood-forming organs and certain disorders involving the immune mechanism: Secondary | ICD-10-CM | POA: Insufficient documentation

## 2013-07-16 DIAGNOSIS — Z8619 Personal history of other infectious and parasitic diseases: Secondary | ICD-10-CM | POA: Insufficient documentation

## 2013-07-16 DIAGNOSIS — Z794 Long term (current) use of insulin: Secondary | ICD-10-CM | POA: Insufficient documentation

## 2013-07-16 DIAGNOSIS — O24919 Unspecified diabetes mellitus in pregnancy, unspecified trimester: Secondary | ICD-10-CM | POA: Insufficient documentation

## 2013-07-16 DIAGNOSIS — Y838 Other surgical procedures as the cause of abnormal reaction of the patient, or of later complication, without mention of misadventure at the time of the procedure: Secondary | ICD-10-CM | POA: Insufficient documentation

## 2013-07-16 DIAGNOSIS — T82838A Hemorrhage of vascular prosthetic devices, implants and grafts, initial encounter: Secondary | ICD-10-CM

## 2013-07-16 DIAGNOSIS — Z87442 Personal history of urinary calculi: Secondary | ICD-10-CM | POA: Insufficient documentation

## 2013-07-16 DIAGNOSIS — O169 Unspecified maternal hypertension, unspecified trimester: Secondary | ICD-10-CM | POA: Insufficient documentation

## 2013-07-16 MED ORDER — CEFTRIAXONE SODIUM 1 G IJ SOLR
1.0000 g | Freq: Once | INTRAMUSCULAR | Status: AC
  Administered 2013-07-16: 1 g via INTRAMUSCULAR
  Filled 2013-07-16: qty 10

## 2013-07-16 MED ORDER — LIDOCAINE HCL (PF) 1 % IJ SOLN
INTRAMUSCULAR | Status: AC
  Administered 2013-07-16: 2.1 mL
  Filled 2013-07-16: qty 5

## 2013-07-16 NOTE — ED Notes (Signed)
IV team paged.  

## 2013-07-16 NOTE — ED Notes (Signed)
Pt presents to department for evaluation of PICC line issue. States she has been taking Rocephin IV q12 hours at home via PICC line to L upper arm, now states she noticed area is swollen and leaking blood around catheter, also states difficult to flush. No other complaints.

## 2013-07-16 NOTE — ED Provider Notes (Signed)
CSN: [id number]     Arrival date & time 07/16/13  0941 History   First MD Initiated Contact with Patient 07/16/13 1109     Chief Complaint  Patient presents with  . Vascular Access Problem   (Consider location/radiation/quality/duration/timing/severity/associated sxs/prior Treatment) HPI Patient reports she was admitted to the hospital October 14 for Escherichia coli bacteremia. She has been getting IV Rocephin twice a day at home. She states she is feeling better. Her fevers are gone. Her chills are gone. Her energy level is improved. She states she still needs 2 more weeks of her IV antibiotics. Her IV site started leaking 3 days ago and states it has fluid and blood coming out where the line goes into the skin. She states it feels swollen and it's starting to hurt. She tells me she has not had her medicines for 48 hours. Patient is G2 P1 Ab0, she is [redacted] weeks pregnant with due date of December 18. She is followed at the women's clinic. She denies any current problems with her pregnancy including abdominal pain, spotting, or discharge.  Patient has history of type 2 diabetes. She states she is normally on metformin however while she is pregnant she is on insulin. Patient also had history of leukemia and is followed by Dr. Cyndie Chime.   PCP Dr Claiborne Billings [facility]  Past Medical History  Diagnosis Date  . Diabetes mellitus   . Hypertension   . Nephrolithiasis   . 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 Hepatic steatosis 01/17/2012  . Cholelithiasis 01/17/2012  . Headache(784.0)   . Leukemia, acute monocytic, in remission   . Bacteremia associated with IV line 05/01/2013    Port-a-cath infection, removed 8/6 by IR. Ecoli grew out in cultures. Sent home with Ceftriaxone to complete 14 day course.   . Bacteremia due to Escherichia coli 05/01/2013    Port-a-cath infection, removed 8/6 by IR. Ecoli grew out in cultures. Sent home with Ceftriaxone  to complete 14 day course.    Past Surgical History  Procedure Laterality Date  . Portacath placement      [facility]  . Cholecystectomy  01/31/2012    Procedure: LAPAROSCOPIC CHOLECYSTECTOMY WITH INTRAOPERATIVE CHOLANGIOGRAM;  Surgeon: Clovis Pu. Cornett, MD;  Location: [facility] OR;  Service: General;  Laterality: N/A;   Family History  Problem Relation Age of Onset  . Kidney disease Mother   . Hypertension Mother   . Epilepsy Mother   . Sleep apnea Mother   . Kidney disease Maternal Grandmother   . Diabetes Maternal Grandmother   . Diabetes Father    History  Substance Use Topics  . Smoking status: Never Smoker   . Smokeless tobacco: Never Used  . Alcohol Use: No   Lives at home unemployed   OB History   Grav Para Term Preterm Abortions TAB SAB Ect Mult Living   2 1 1  0 0 0 0 0 0 1     Review of Systems  All other systems reviewed and are negative.    Allergies  Ultram  Home Medications   Current Outpatient Rx  Name  Route  Sig  Dispense  Refill  . dextrose 5 % SOLN 50 mL with cefTRIAXone 1 G SOLR 1 g   Intravenous   Inject 1 g into the vein every 12 (twelve) hours.   52 g   0   . insulin aspart (NOVOLOG) 100 UNIT/ML injection   Subcutaneous   Inject  20 Units into the skin 3 (three) times daily with meals.   10 mL   12   . insulin NPH (HUMULIN N,NOVOLIN N) 100 UNIT/ML injection   Subcutaneous   Inject 15-17 Units into the skin 2 (two) times daily. Inject 15 units in the morning and 17 units at night.         . Prenatal Vit-Fe Fumarate-FA (MULTIVITAMIN-PRENATAL) 27-0.8 MG TABS   Oral   Take 1 tablet by mouth daily at 12 noon.   30 each   3    Rocephin 1000 mg BID  BP 120/70  Pulse 99  Temp(Src) 98.4 F (36.9 C) (Oral)  Resp 16  Ht 5\' 4"  (1.626 m)  Wt 290 lb (131.543 kg)  BMI 49.75 kg/m2  SpO2 99%  LMP 12/26/2012  Vital signs normal   Physical Exam  Nursing note and vitals reviewed. Constitutional: She is oriented to person, place, and time.  She appears well-developed and well-nourished.  Non-toxic appearance. She does not appear ill. No distress.  HENT:  Head: Normocephalic and atraumatic.  Right Ear: External ear normal.  Left Ear: External ear normal.  Nose: Nose normal. No mucosal edema or rhinorrhea.  Mouth/Throat: Oropharynx is clear and moist and mucous membranes are normal. No dental abscesses or uvula swelling.  Eyes: Conjunctivae and EOM are normal. Pupils are equal, round, and reactive to light.  Neck: Normal range of motion and full passive range of motion without pain. Neck supple.  Pulmonary/Chest: Effort normal. No respiratory distress. She has no rhonchi. She exhibits no crepitus.  Abdominal: Soft. Normal appearance and bowel sounds are normal. She exhibits no distension. There is no tenderness. There is no rebound and no guarding.  Abdomen c/w dates FHT 140  Musculoskeletal: Normal range of motion. She exhibits no edema and no tenderness.  Moves all extremities well.  IV site in left upper arm has some blood under her tegaderm and when the line is flushed more comes out around the insertion site and she gets more swelling along the course just under the skin of the line.   Neurological: She is alert and oriented to person, place, and time. She has normal strength. No cranial nerve deficit.  Skin: Skin is warm, dry and intact. No rash noted. No erythema. No pallor.  Psychiatric: She has a normal mood and affect. Her speech is normal and behavior is normal. Her mood appears not anxious.    ED Course  Procedures (including critical care time)  Medications  cefTRIAXone (ROCEPHIN) injection 1 g (1 g Intramuscular Given 07/16/13 1222)  lidocaine (PF) (XYLOCAINE) 1 % injection (2.1 mLs  Given 07/16/13 1222)   Pt given Rocephin 1 gram IM while waiting to assess her PICC  IV team states they don't have time today to reinsert her PICC, they did remove her present line.   12:39 Radiologist, states will have to be  scheduled as outpatient for tomorrow  I spoke to radiology department and they are unable to give me an appointment time to return tomorrow, order placed in EPIC and patient should be called in the am.   Nursing staff inserted saline lock in ED for patient to use temporarily until she can get her PICC inserted again.     MDM   1. Bleeding from PICC line, initial encounter    Plan discharge   Devoria Albe, MD, Franz Dell, MD 07/16/13 1409

## 2013-07-16 NOTE — ED Notes (Signed)
IV team at bedside 

## 2013-07-16 NOTE — Progress Notes (Signed)
Midline removal per order.  Pt presents with midline leaking at site and swelling in arm.  Confirmed insertion site leaking with flush.  Line feels looped under skin.  Once dressing removed, line spontaneously exited site 3 cm.  Site cleaned with chlorhexadine, vaseline guaze and 4x4 applied to site.  No signs of infection or bleeding noted.  Pt verbalizes signs of infection, dressing care, when to call doctor/ED and bleeding care via teachback method.  No ADN.

## 2013-07-17 ENCOUNTER — Other Ambulatory Visit (HOSPITAL_COMMUNITY): Payer: Self-pay | Admitting: Emergency Medicine

## 2013-07-17 ENCOUNTER — Ambulatory Visit (HOSPITAL_COMMUNITY)
Admission: RE | Admit: 2013-07-17 | Discharge: 2013-07-17 | Disposition: A | Discharge status: Home / Self Care | Payer: Medicaid Other | Source: Ambulatory Visit | Attending: Emergency Medicine | Admitting: Emergency Medicine

## 2013-07-17 DIAGNOSIS — R7881 Bacteremia: Secondary | ICD-10-CM

## 2013-07-17 NOTE — Procedures (Signed)
Successful placement of single lumen power injectable PICC line to right brachial vein. Length 45cm Tip at lower SVC/RA No complications Ready for use.  Pattricia Boss PA-C Interventional Radiology  07/17/13  2:58 PM

## 2013-07-24 ENCOUNTER — Other Ambulatory Visit (HOSPITAL_BASED_OUTPATIENT_CLINIC_OR_DEPARTMENT_OTHER): Payer: Medicaid Other | Admitting: Lab

## 2013-07-24 ENCOUNTER — Ambulatory Visit (INDEPENDENT_AMBULATORY_CARE_PROVIDER_SITE_OTHER): Payer: Medicaid Other | Admitting: Family Medicine

## 2013-07-24 ENCOUNTER — Other Ambulatory Visit: Payer: Medicaid Other | Admitting: Lab

## 2013-07-24 ENCOUNTER — Inpatient Hospital Stay (HOSPITAL_COMMUNITY)
Admission: AD | Admit: 2013-07-24 | Discharge: 2013-07-24 | Disposition: A | Discharge status: Home / Self Care | Payer: Medicaid Other | Source: Ambulatory Visit | Attending: Obstetrics & Gynecology | Admitting: Obstetrics & Gynecology

## 2013-07-24 ENCOUNTER — Encounter (HOSPITAL_COMMUNITY): Payer: Self-pay | Admitting: *Deleted

## 2013-07-24 VITALS — BP 108/76 | Temp 98.1°F | Wt 284.7 lb

## 2013-07-24 DIAGNOSIS — Z6841 Body Mass Index (BMI) 40.0 and over, adult: Secondary | ICD-10-CM

## 2013-07-24 DIAGNOSIS — O4700 False labor before 37 completed weeks of gestation, unspecified trimester: Secondary | ICD-10-CM | POA: Insufficient documentation

## 2013-07-24 DIAGNOSIS — I10 Essential (primary) hypertension: Secondary | ICD-10-CM

## 2013-07-24 DIAGNOSIS — O368190 Decreased fetal movements, unspecified trimester, not applicable or unspecified: Secondary | ICD-10-CM | POA: Insufficient documentation

## 2013-07-24 DIAGNOSIS — O24919 Unspecified diabetes mellitus in pregnancy, unspecified trimester: Secondary | ICD-10-CM

## 2013-07-24 DIAGNOSIS — A498 Other bacterial infections of unspecified site: Secondary | ICD-10-CM

## 2013-07-24 DIAGNOSIS — R7881 Bacteremia: Secondary | ICD-10-CM

## 2013-07-24 DIAGNOSIS — C9201 Acute myeloblastic leukemia, in remission: Secondary | ICD-10-CM

## 2013-07-24 DIAGNOSIS — C9241 Acute promyelocytic leukemia, in remission: Secondary | ICD-10-CM

## 2013-07-24 DIAGNOSIS — O0990 Supervision of high risk pregnancy, unspecified, unspecified trimester: Secondary | ICD-10-CM

## 2013-07-24 DIAGNOSIS — O368139 Decreased fetal movements, third trimester, other fetus: Secondary | ICD-10-CM

## 2013-07-24 DIAGNOSIS — O24913 Unspecified diabetes mellitus in pregnancy, third trimester: Secondary | ICD-10-CM

## 2013-07-24 LAB — MORPHOLOGY

## 2013-07-24 LAB — CBC WITH DIFFERENTIAL/PLATELET
BASO%: 0.6 % (ref 0.0–2.0)
Basophils Absolute: 0 10*3/uL (ref 0.0–0.1)
Eosinophils Absolute: 0.1 10*3/uL (ref 0.0–0.5)
HCT: 33.6 % — ABNORMAL LOW (ref 34.8–46.6)
HGB: 11.1 g/dL — ABNORMAL LOW (ref 11.6–15.9)
LYMPH%: 25 % (ref 14.0–49.7)
MCHC: 33 g/dL (ref 31.5–36.0)
MCV: 85.8 fL (ref 79.5–101.0)
MONO#: 0.4 10*3/uL (ref 0.1–0.9)
NEUT#: 4 10*3/uL (ref 1.5–6.5)
Platelets: 230 10*3/uL (ref 145–400)
RBC: 3.92 10*6/uL (ref 3.70–5.45)
WBC: 6.1 10*3/uL (ref 3.9–10.3)

## 2013-07-24 LAB — POCT URINALYSIS DIP (DEVICE)
Bilirubin Urine: NEGATIVE
Hgb urine dipstick: NEGATIVE
Ketones, ur: NEGATIVE mg/dL
Protein, ur: NEGATIVE mg/dL
Specific Gravity, Urine: 1.02 (ref 1.005–1.030)
pH: 6 (ref 5.0–8.0)

## 2013-07-24 LAB — LACTATE DEHYDROGENASE (CC13): LDH: 171 U/L (ref 125–245)

## 2013-07-24 NOTE — Progress Notes (Signed)
P= 95 C/o of lower pelvic/vaginal pain every time pt. Stands up. Pt. States she has frequent contractions (every 10 minutes, they stop, and then they start again) when she lays down and this has been occuring for the last three days.

## 2013-07-24 NOTE — MAU Provider Note (Signed)
History     CSN: [id number]  Arrival date and time: 07/24/13 1207   None     Chief Complaint  Patient presents with  . Decreased Fetal Movement  . Contractions   HPI  Melissa Washington is a 27 y.o G2P1001 who presents at 33w4 with contraction like pain and concerns of decreased fetal movement. Per pt suprapubic pressure began yesterday evening described as 8/10 lasting for 10 mins, occuring every 25 mins. Denies any LOF, vaginal bleeding or discharge, no dysuria, hematuria. Having reg BMs.   PNC at [facility]. ClassBDM, on humulin 15U am/17U pm and novolog 20U TID with meals. Reports postprandial 70s-150s and fasting 100s. Baseline labs hga1c 8.6. 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   OB History   Grav Para Term Preterm Abortions TAB SAB Ect Mult Living   2 1 1  0 0 0 0 0 0 1      Past Medical History  Diagnosis Date  . Diabetes mellitus   . Hypertension   . Nephrolithiasis   . 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 Hepatic steatosis 01/17/2012  . Cholelithiasis 01/17/2012  . Headache(784.0)   . Leukemia, acute monocytic, in remission   . Bacteremia associated with IV line 05/01/2013    Port-a-cath infection, removed 8/6 by IR. Ecoli grew out in cultures. Sent home with Ceftriaxone to complete 14 day course.   . Bacteremia due to Escherichia coli 05/01/2013    Port-a-cath infection, removed 8/6 by IR. Ecoli grew out in cultures. Sent home with Ceftriaxone to complete 14 day course.     Past Surgical History  Procedure Laterality Date  . Portacath placement      [facility]  . Cholecystectomy  01/31/2012    Procedure: LAPAROSCOPIC CHOLECYSTECTOMY WITH INTRAOPERATIVE CHOLANGIOGRAM;  Surgeon: Clovis Pu. Cornett, MD;  Location: [facility] OR;  Service: General;  Laterality: N/A;    Family History  Problem Relation Age of Onset  . Kidney disease Mother   . Hypertension Mother   . Epilepsy Mother   . Sleep apnea Mother   . Kidney  disease Maternal Grandmother   . Diabetes Maternal Grandmother   . Diabetes Father     History  Substance Use Topics  . Smoking status: Never Smoker   . Smokeless tobacco: Never Used  . Alcohol Use: No    Allergies:  Allergies  Allergen Reactions  . Ultram [Tramadol Hcl] Nausea And Vomiting    Prescriptions prior to admission  Medication Sig Dispense Refill  . dextrose 5 % SOLN 50 mL with cefTRIAXone 1 G SOLR 1 g Inject 1 g into the vein every 12 (twelve) hours.  52 g  0  . insulin aspart (NOVOLOG) 100 UNIT/ML injection Inject 20 Units into the skin 3 (three) times daily with meals.  10 mL  12  . insulin NPH (HUMULIN N,NOVOLIN N) 100 UNIT/ML injection Inject 15-17 Units into the skin 2 (two) times daily. Inject 15 units in the morning and 17 units at night.      . Prenatal Vit-Fe Fumarate-FA (PRENATAL MULTIVITAMIN) TABS tablet Take 1 tablet by mouth daily at 12 noon.        Review of Systems  Constitutional: Negative for fever, chills, malaise/fatigue and diaphoresis.  HENT: Negative for congestion and sore throat.   Eyes: Negative for blurred vision.  Respiratory: Negative for cough and shortness of breath.   Cardiovascular: Negative for chest pain and palpitations.  Gastrointestinal:  Positive for abdominal pain. Negative for heartburn, nausea and diarrhea.  Genitourinary: Negative for dysuria, urgency and frequency.  Musculoskeletal: Negative for myalgias.  Skin: Negative for rash.  Neurological: Negative for dizziness, seizures, loss of consciousness and headaches.  Endo/Heme/Allergies: Does not bruise/bleed easily.  Psychiatric/Behavioral: Negative for depression.   Physical Exam   Last menstrual period 12/26/2012.  Physical Exam  Constitutional: She is oriented to person, place, and time. She appears well-developed and well-nourished. No distress.  HENT:  Head: Normocephalic and atraumatic.  Eyes: EOM are normal.  Neck: Normal range of motion. Neck supple.   Cardiovascular: Normal rate, regular rhythm and normal heart sounds.   Distant heart sounds 2/2 body habitus  Respiratory: Effort normal and breath sounds normal. No respiratory distress.  GI: Soft. Bowel sounds are normal.  Obese, gravid   Genitourinary:  Cervix visually closed and thick  Musculoskeletal: Normal range of motion. She exhibits no edema.  Neurological: She is alert and oriented to person, place, and time.  Skin: Skin is warm and dry. No erythema.  Psychiatric: She has a normal mood and affect. Her behavior is normal.   PE performed by Dr. Michail Jewels  Limited third trime [place]: done by Dr. Ike Bene - SIUP, VTX, CM 143, Placenta ant, AFI 13.8  MAU Course  Procedures  MDM U/A with 500 glucose otherwise clean FFN FHR- 135, mod var, post accel, X1late decel UC- every 6-38mins  Assessment and Plan  Melissa Washington is a 27 y.o. G2P1001 at [redacted]w[redacted]d here for evaluation of ctx and decreased fetal movement. Pt with closed cervix on SSE and negative FFN. Contractions do not appear to be impairing pt QOL. Will discharge pt home, encourage hydration, pt to be in in 2xweekly NST and weekly AFI. Pt given PTL precautions. No steroids or tocolytics at this time given negative FFN and reassuring visualization of cervix.  Recommend abdominal binder for suprapubic pain that is worse with standing.  Hx of recurrent UTI with bacteremia: f/u with ID on 5November. Recommend continued prophylaxis orally for remainder of pregnancy.  Tawana Scale, MD OB Fellow

## 2013-07-24 NOTE — MAU Note (Signed)
Pt presents with complaints of contractions for the last few days that have been on and off and some are pretty painful. States a decrease in fetal movement over the last 24 hours.

## 2013-07-24 NOTE — Patient Instructions (Signed)

## 2013-07-24 NOTE — MAU Note (Signed)
At clinic appt, sent up for monitoring and further eval.

## 2013-07-24 NOTE — Progress Notes (Signed)
Pt here for ROBV.  - hasn't been seen in clinic since 26 weeks - was hospitalized again with bacteremia- now has 2 days left of rocephin- doing well. No fevers, chills, nausea, vomiting - states for the last 3 days has been having q75min contractions and they are getting more painful - since they started, she hasn't been feeling the baby move except for 2x daily - no LOF, VB.   DM-   Type II Fasting 60-80s.  Postprandials at goal  Didn't bring book today though- forgot  O: see flowsheet  A/P  - will send to MAU for evaluation given decreased FM and contractions - hold on vaginal exam incase she is contracting and will defer possibility of FFN to treatment team in the MAU - f/u in 1week given we were unable to fully discuss her DM care.

## 2013-07-26 ENCOUNTER — Ambulatory Visit (INDEPENDENT_AMBULATORY_CARE_PROVIDER_SITE_OTHER): Payer: Medicaid Other | Admitting: Infectious Diseases

## 2013-07-26 ENCOUNTER — Other Ambulatory Visit (HOSPITAL_COMMUNITY): Payer: Self-pay | Admitting: Maternal and Fetal Medicine

## 2013-07-26 ENCOUNTER — Encounter (HOSPITAL_COMMUNITY): Payer: Self-pay

## 2013-07-26 ENCOUNTER — Ambulatory Visit (HOSPITAL_COMMUNITY)
Admission: RE | Admit: 2013-07-26 | Discharge: 2013-07-26 | Disposition: A | Discharge status: Home / Self Care | Payer: Medicaid Other | Source: Ambulatory Visit | Attending: Family Medicine | Admitting: Family Medicine

## 2013-07-26 ENCOUNTER — Telehealth: Payer: Self-pay | Admitting: *Deleted

## 2013-07-26 ENCOUNTER — Encounter: Payer: Self-pay | Admitting: Infectious Diseases

## 2013-07-26 VITALS — BP 120/62 | HR 93 | Wt 286.0 lb

## 2013-07-26 VITALS — Temp 98.2°F | Ht 62.0 in | Wt 286.0 lb

## 2013-07-26 DIAGNOSIS — E1165 Type 2 diabetes mellitus with hyperglycemia: Secondary | ICD-10-CM

## 2013-07-26 DIAGNOSIS — R7881 Bacteremia: Secondary | ICD-10-CM

## 2013-07-26 DIAGNOSIS — I10 Essential (primary) hypertension: Secondary | ICD-10-CM

## 2013-07-26 DIAGNOSIS — IMO0001 Reserved for inherently not codable concepts without codable children: Secondary | ICD-10-CM

## 2013-07-26 DIAGNOSIS — O10019 Pre-existing essential hypertension complicating pregnancy, unspecified trimester: Secondary | ICD-10-CM | POA: Insufficient documentation

## 2013-07-26 DIAGNOSIS — A498 Other bacterial infections of unspecified site: Secondary | ICD-10-CM

## 2013-07-26 DIAGNOSIS — O0990 Supervision of high risk pregnancy, unspecified, unspecified trimester: Secondary | ICD-10-CM

## 2013-07-26 DIAGNOSIS — C9241 Acute promyelocytic leukemia, in remission: Secondary | ICD-10-CM

## 2013-07-26 DIAGNOSIS — N10 Acute tubulo-interstitial nephritis: Secondary | ICD-10-CM

## 2013-07-26 DIAGNOSIS — E669 Obesity, unspecified: Secondary | ICD-10-CM | POA: Insufficient documentation

## 2013-07-26 DIAGNOSIS — IMO0002 Reserved for concepts with insufficient information to code with codable children: Secondary | ICD-10-CM

## 2013-07-26 DIAGNOSIS — O24919 Unspecified diabetes mellitus in pregnancy, unspecified trimester: Secondary | ICD-10-CM | POA: Insufficient documentation

## 2013-07-26 MED ORDER — DEXTROSE 5 % IV SOLN: 2.0000 g | INTRAVENOUS | Status: DC

## 2013-07-26 NOTE — Assessment & Plan Note (Addendum)
Will complete ceftriaxone on 08-02-13 and then will transition her to po macrobid. Will see her back in 1 week to remover her PIC and to recheck her BCx. Change her ceftriaxone to qday. Her labs are reviewed and indicate only mild anemia as previous.

## 2013-07-26 NOTE — Telephone Encounter (Signed)
Pt to continue with IV ABX through 08/02/13. Med changed to ceftriaxone 2gm once daily.  Order called into [facility] pharmacy. Pt aware. Andree Coss, RN

## 2013-07-26 NOTE — Assessment & Plan Note (Signed)
Appears to be doing well. No numbness.

## 2013-07-26 NOTE — Progress Notes (Signed)
  Subjective:    Patient ID: Melissa Washington, female    DOB: 01/20/1986, 27 y.o.   MRN: [id number]  HPI 27 y.o. 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 Her course has been complicated by Escherichia coli bacteremia 04-25-13 which was thought to be associated with her Port-A-Cath. This was removed. She finished 2 weeks of ceftriaxone intravenously, and was doing well when seen in followup in the infectious disease clinic (05-25-13).  In mid-September she presented to her obstetrician with symptoms of fever and malaise and was diagnosed with a urinary tract infection with Escherichia coli (> 100k). Blood cultures were not done at that point in time. Patient was sent home on Macrobid to which the organism was sensitive and she states that she felt better while on the Macrobid.  She returned October 16 with fever nausea vomiting, flank pain, and mild headache.  Her blood cultures again grew a pansensitive Escherichia coli in 2 out of 2 cultures. She is also growing Escherichia coli from urine (40k). She underwent MRI of the abdomen to exclude intra-abdominal abscess (-). She also had a 2-D echocardiogram which was suboptimal due to patient's body habitus also (-).  She was d/c home on 10-19 on VI ceftriaxone via midline. This came out ~ 2weeks ago, since replaced with a PIC line. No problems with PIC since.  No fever or chills, passing urine without difficulty. No burning or cloudiness.  Has been getting 1g bid of her anbx. Had her last dose yesterday.   Review of Systems  Constitutional: Negative for appetite change and unexpected weight change.  Cardiovascular: Positive for leg swelling.  Gastrointestinal: Negative for nausea, vomiting, diarrhea and constipation.  See HPI. FSG have been 130-70. Does feel fetal movements.     Objective:   Physical Exam  Constitutional: She appears well-developed and well-nourished. No  distress.  HENT:  Mouth/Throat: No oropharyngeal exudate.  Eyes: EOM are normal. Pupils are equal, round, and reactive to light.  Neck: Neck supple.  Cardiovascular: Normal rate, regular rhythm and normal heart sounds.   Pulmonary/Chest: Effort normal and breath sounds normal.  Abdominal: Soft. Bowel sounds are normal. She exhibits distension. There is no tenderness. There is no CVA tenderness.  Lymphadenopathy:    She has no cervical adenopathy.  Skin:             Assessment & Plan:

## 2013-07-31 ENCOUNTER — Ambulatory Visit (INDEPENDENT_AMBULATORY_CARE_PROVIDER_SITE_OTHER): Payer: Medicaid Other | Admitting: Family Medicine

## 2013-07-31 VITALS — BP 115/83 | Wt 282.1 lb

## 2013-07-31 DIAGNOSIS — O24919 Unspecified diabetes mellitus in pregnancy, unspecified trimester: Secondary | ICD-10-CM

## 2013-07-31 DIAGNOSIS — O0990 Supervision of high risk pregnancy, unspecified, unspecified trimester: Secondary | ICD-10-CM

## 2013-07-31 DIAGNOSIS — O24913 Unspecified diabetes mellitus in pregnancy, third trimester: Secondary | ICD-10-CM

## 2013-07-31 DIAGNOSIS — O24911 Unspecified diabetes mellitus in pregnancy, first trimester: Secondary | ICD-10-CM

## 2013-07-31 DIAGNOSIS — O09899 Supervision of other high risk pregnancies, unspecified trimester: Secondary | ICD-10-CM

## 2013-07-31 LAB — POCT URINALYSIS DIP (DEVICE)
Bilirubin Urine: NEGATIVE
Nitrite: NEGATIVE
Protein, ur: NEGATIVE mg/dL
Urobilinogen, UA: 0.2 mg/dL (ref 0.0–1.0)
pH: 6 (ref 5.0–8.0)

## 2013-07-31 MED ORDER — INSULIN NPH (HUMAN) (ISOPHANE) 100 UNIT/ML ~~LOC~~ SUSP: 15.0000 [IU] | Freq: Two times a day (BID) | SUBCUTANEOUS | Status: DC

## 2013-07-31 MED ORDER — INSULIN PEN NEEDLE 32G X 4 MM MISC: 1.0000 [IU] | Status: DC

## 2013-07-31 MED ORDER — INSULIN ASPART 100 UNIT/ML ~~LOC~~ SOLN: 20.0000 [IU] | Freq: Three times a day (TID) | SUBCUTANEOUS | Status: DC

## 2013-07-31 MED ORDER — PRENATAL VITAMINS 0.8 MG PO TABS: 1.0000 | ORAL_TABLET | Freq: Every day | ORAL | Status: DC

## 2013-07-31 NOTE — Progress Notes (Signed)
Pulse-  95 

## 2013-07-31 NOTE — Patient Instructions (Signed)
Contraception Choices Contraception (birth control) is the use of any methods or devices to prevent pregnancy. Below are some methods to help avoid pregnancy. HORMONAL METHODS   Contraceptive implant This is a thin, plastic tube containing progesterone hormone. It does not contain estrogen hormone. Your health care provider inserts the tube in the inner part of the upper arm. The tube can remain in place for up to 3 years. After 3 years, the implant must be removed. The implant prevents the ovaries from releasing an egg (ovulation), thickens the cervical mucus to prevent sperm from entering the uterus, and thins the lining of the inside of the uterus.  Progesterone-only injections These injections are given every 3 months by your health care provider to prevent pregnancy. This synthetic progesterone hormone stops the ovaries from releasing eggs. It also thickens cervical mucus and changes the uterine lining. This makes it harder for sperm to survive in the uterus.  Birth control pills These pills contain estrogen and progesterone hormone. They work by preventing the ovaries from releasing eggs (ovulation). They also cause the cervical mucus to thicken, preventing the sperm from entering the uterus. Birth control pills are prescribed by a health care provider.Birth control pills can also be used to treat heavy periods.  Minipill This type of birth control pill contains only the progesterone hormone. They are taken every day of each month and must be prescribed by your health care provider.  Birth control patch The patch contains hormones similar to those in birth control pills. It must be changed once a week and is prescribed by a health care provider.  Vaginal ring The ring contains hormones similar to those in birth control pills. It is left in the vagina for 3 weeks, removed for 1 week, and then a new one is put back in place. The patient must be comfortable inserting and removing the ring from  the vagina.A health care provider's prescription is necessary.  Emergency contraception Emergency contraceptives prevent pregnancy after unprotected sexual intercourse. This pill can be taken right after sex or up to 5 days after unprotected sex. It is most effective the sooner you take the pills after having sexual intercourse. Most emergency contraceptive pills are available without a prescription. Check with your pharmacist. Do not use emergency contraception as your only form of birth control. BARRIER METHODS   Female condom This is a thin sheath (latex or rubber) that is worn over the penis during sexual intercourse. It can be used with spermicide to increase effectiveness.  Female condom. This is a soft, loose-fitting sheath that is put into the vagina before sexual intercourse.  Diaphragm This is a soft, latex, dome-shaped barrier that must be fitted by a health care provider. It is inserted into the vagina, along with a spermicidal jelly. It is inserted before intercourse. The diaphragm should be left in the vagina for 6 to 8 hours after intercourse.  Cervical cap This is a round, soft, latex or plastic cup that fits over the cervix and must be fitted by a health care provider. The cap can be left in place for up to 48 hours after intercourse.  Sponge This is a soft, circular piece of polyurethane foam. The sponge has spermicide in it. It is inserted into the vagina after wetting it and before sexual intercourse.  Spermicides These are chemicals that kill or block sperm from entering the cervix and uterus. They come in the form of creams, jellies, suppositories, foam, or tablets. They do not require a   prescription. They are inserted into the vagina with an applicator before having sexual intercourse. The process must be repeated every time you have sexual intercourse. INTRAUTERINE CONTRACEPTION  Intrauterine device (IUD) This is a T-shaped device that is put in a woman's uterus during a  menstrual period to prevent pregnancy. There are 2 types:  Copper IUD This type of IUD is wrapped in copper wire and is placed inside the uterus. Copper makes the uterus and fallopian tubes produce a fluid that kills sperm. It can stay in place for 10 years.  Hormone IUD This type of IUD contains the hormone progestin (synthetic progesterone). The hormone thickens the cervical mucus and prevents sperm from entering the uterus, and it also thins the uterine lining to prevent implantation of a fertilized egg. The hormone can weaken or kill the sperm that get into the uterus. It can stay in place for 3 5 years, depending on which type of IUD is used. PERMANENT METHODS OF CONTRACEPTION  Female tubal ligation This is when the woman's fallopian tubes are surgically sealed, tied, or blocked to prevent the egg from traveling to the uterus.  Hysteroscopic sterilization This involves placing a small coil or insert into each fallopian tube. Your doctor uses a technique called hysteroscopy to do the procedure. The device causes scar tissue to form. This results in permanent blockage of the fallopian tubes, so the sperm cannot fertilize the egg. It takes about 3 months after the procedure for the tubes to become blocked. You must use another form of birth control for these 3 months.  Female sterilization This is when the female has the tubes that carry sperm tied off (vasectomy).This blocks sperm from entering the vagina during sexual intercourse. After the procedure, the man can still ejaculate fluid (semen). NATURAL PLANNING METHODS  Natural family planning This is not having sexual intercourse or using a barrier method (condom, diaphragm, cervical cap) on days the woman could become pregnant.  Calendar method This is keeping track of the length of each menstrual cycle and identifying when you are fertile.  Ovulation method This is avoiding sexual intercourse during ovulation.  Symptothermal method This is  avoiding sexual intercourse during ovulation, using a thermometer and ovulation symptoms.  Post ovulation method This is timing sexual intercourse after you have ovulated. Regardless of which type or method of contraception you choose, it is important that you use condoms to protect against the transmission of sexually transmitted infections (STIs). Talk with your health care provider about which form of contraception is most appropriate for you. Document Released: 09/07/2005 Document Revised: 05/10/2013 Document Reviewed: 03/02/2013 [employer] Patient Information 2014 [employer].  Breastfeeding Deciding to breastfeed is one of the best choices you can make for you and your baby. A change in hormones during pregnancy causes your breast tissue to grow and increases the number and size of your milk ducts. These hormones also allow proteins, sugars, and fats from your blood supply to make breast milk in your milk-producing glands. Hormones prevent breast milk from being released before your baby is born as well as prompt milk flow after birth. Once breastfeeding has begun, thoughts of your baby, as well as his or her sucking or crying, can stimulate the release of milk from your milk-producing glands.  BENEFITS OF BREASTFEEDING For Your Baby  Your first milk (colostrum) helps your baby's digestive system function better.   There are antibodies in your milk that help your baby fight off infections.   Your baby has a   lower incidence of asthma, allergies, and sudden infant death syndrome.   The nutrients in breast milk are better for your baby than infant formulas and are designed uniquely for your baby's needs.   Breast milk improves your baby's brain development.   Your baby is less likely to develop other conditions, such as childhood obesity, asthma, or type 2 diabetes mellitus.  For You   Breastfeeding helps to create a very special bond between you and your baby.   Breastfeeding  is convenient. Breast milk is always available at the correct temperature and costs nothing.   Breastfeeding helps to burn calories and helps you lose the weight gained during pregnancy.   Breastfeeding makes your uterus contract to its prepregnancy size faster and slows bleeding (lochia) after you give birth.   Breastfeeding helps to lower your risk of developing type 2 diabetes mellitus, osteoporosis, and breast or ovarian cancer later in life. SIGNS THAT YOUR BABY IS HUNGRY Early Signs of Hunger  Increased alertness or activity.  Stretching.  Movement of the head from side to side.  Movement of the head and opening of the mouth when the corner of the mouth or cheek is stroked (rooting).  Increased sucking sounds, smacking lips, cooing, sighing, or squeaking.  Hand-to-mouth movements.  Increased sucking of fingers or hands. Late Signs of Hunger  Fussing.  Intermittent crying. Extreme Signs of Hunger Signs of extreme hunger will require calming and consoling before your baby will be able to breastfeed successfully. Do not wait for the following signs of extreme hunger to occur before you initiate breastfeeding:   Restlessness.  A loud, strong cry.   Screaming. BREASTFEEDING BASICS Breastfeeding Initiation  Find a comfortable place to sit or lie down, with your neck and back well supported.  Place a pillow or rolled up blanket under your baby to bring him or her to the level of your breast (if you are seated). Nursing pillows are specially designed to help support your arms and your baby while you breastfeed.  Make sure that your baby's abdomen is facing your abdomen.   Gently massage your breast. With your fingertips, massage from your chest wall toward your nipple in a circular motion. This encourages milk flow. You may need to continue this action during the feeding if your milk flows slowly.  Support your breast with 4 fingers underneath and your thumb above  your nipple. Make sure your fingers are well away from your nipple and your baby's mouth.   Stroke your baby's lips gently with your finger or nipple.   When your baby's mouth is open wide enough, quickly bring your baby to your breast, placing your entire nipple and as much of the colored area around your nipple (areola) as possible into your baby's mouth.   More areola should be visible above your baby's upper lip than below the lower lip.   Your baby's tongue should be between his or her lower gum and your breast.   Ensure that your baby's mouth is correctly positioned around your nipple (latched). Your baby's lips should create a seal on your breast and be turned out (everted).  It is common for your baby to suck about 2 3 minutes in order to start the flow of breast milk. Latching Teaching your baby how to latch on to your breast properly is very important. An improper latch can cause nipple pain and decreased milk supply for you and poor weight gain in your baby. Also, if your baby is   not latched onto your nipple properly, he or she may swallow some air during feeding. This can make your baby fussy. Burping your baby when you switch breasts during the feeding can help to get rid of the air. However, teaching your baby to latch on properly is still the best way to prevent fussiness from swallowing air while breastfeeding. Signs that your baby has successfully latched on to your nipple:    Silent tugging or silent sucking, without causing you pain.   Swallowing heard between every 3 4 sucks.    Muscle movement above and in front of his or her ears while sucking.  Signs that your baby has not successfully latched on to nipple:   Sucking sounds or smacking sounds from your baby while breastfeeding.  Nipple pain. If you think your baby has not latched on correctly, slip your finger into the corner of your baby's mouth to break the suction and place it between your baby's gums.  Attempt breastfeeding initiation again. Signs of Successful Breastfeeding Signs from your baby:   A gradual decrease in the number of sucks or complete cessation of sucking.   Falling asleep.   Relaxation of his or her body.   Retention of a small amount of milk in his or her mouth.   Letting go of your breast by himself or herself. Signs from you:  Breasts that have increased in firmness, weight, and size 1 3 hours after feeding.   Breasts that are softer immediately after breastfeeding.  Increased milk volume, as well as a change in milk consistency and color by the 5th day of breastfeeding.   Nipples that are not sore, cracked, or bleeding. Signs That Your Baby is Getting Enough Milk  Wetting at least 3 diapers in a 24-hour period. The urine should be clear and pale yellow by age 5 days.  At least 3 stools in a 24-hour period by age 5 days. The stool should be soft and yellow.  At least 3 stools in a 24-hour period by age 7 days. The stool should be seedy and yellow.  No loss of weight greater than 10% of birth weight during the first 3 days of age.  Average weight gain of 4 7 ounces (120 210 mL) per week after age 4 days.  Consistent daily weight gain by age 5 days, without weight loss after the age of 2 weeks. After a feeding, your baby may spit up a small amount. This is common. BREASTFEEDING FREQUENCY AND DURATION Frequent feeding will help you make more milk and can prevent sore nipples and breast engorgement. Breastfeed when you feel the need to reduce the fullness of your breasts or when your baby shows signs of hunger. This is called "breastfeeding on demand." Avoid introducing a pacifier to your baby while you are working to establish breastfeeding (the first 4 6 weeks after your baby is born). After this time you may choose to use a pacifier. Research has shown that pacifier use during the first year of a baby's life decreases the risk of sudden infant death  syndrome (SIDS). Allow your baby to feed on each breast as long as he or she wants. Breastfeed until your baby is finished feeding. When your baby unlatches or falls asleep while feeding from the first breast, offer the second breast. Because newborns are often sleepy in the first few weeks of life, you may need to awaken your baby to get him or her to feed. Breastfeeding times will vary from   baby to baby. However, the following rules can serve as a guide to help you ensure that your baby is properly fed:  Newborns (babies 4 weeks of age or younger) may breastfeed every 1 3 hours.  Newborns should not go longer than 3 hours during the day or 5 hours during the night without breastfeeding.  You should breastfeed your baby a minimum of 8 times in a 24-hour period until you begin to introduce solid foods to your baby at around 6 months of age. BREAST MILK PUMPING Pumping and storing breast milk allows you to ensure that your baby is exclusively fed your breast milk, even at times when you are unable to breastfeed. This is especially important if you are going back to work while you are still breastfeeding or when you are not able to be present during feedings. Your lactation consultant can give you guidelines on how long it is safe to store breast milk.  A breast pump is a machine that allows you to pump milk from your breast into a sterile bottle. The pumped breast milk can then be stored in a refrigerator or freezer. Some breast pumps are operated by hand, while others use electricity. Ask your lactation consultant which type will work best for you. Breast pumps can be purchased, but some hospitals and breastfeeding support groups lease breast pumps on a monthly basis. A lactation consultant can teach you how to hand express breast milk, if you prefer not to use a pump.  CARING FOR YOUR BREASTS WHILE YOU BREASTFEED Nipples can become dry, cracked, and sore while breastfeeding. The following  recommendations can help keep your breasts moisturized and healthy:  Avoid using soap on your nipples.   Wear a supportive bra. Although not required, special nursing bras and tank tops are designed to allow access to your breasts for breastfeeding without taking off your entire bra or top. Avoid wearing underwire style bras or extremely tight bras.  Air dry your nipples for 3 4minutes after each feeding.   Use only cotton bra pads to absorb leaked breast milk. Leaking of breast milk between feedings is normal.   Use lanolin on your nipples after breastfeeding. Lanolin helps to maintain your skin's normal moisture barrier. If you use pure lanolin you do not need to wash it off before feeding your baby again. Pure lanolin is not toxic to your baby. You may also hand express a few drops of breast milk and gently massage that milk into your nipples and allow the milk to air dry. In the first few weeks after giving birth, some women experience extremely full breasts (engorgement). Engorgement can make your breasts feel heavy, warm, and tender to the touch. Engorgement peaks within 3 5 days after you give birth. The following recommendations can help ease engorgement:  Completely empty your breasts while breastfeeding or pumping. You may want to start by applying warm, moist heat (in the shower or with warm water-soaked hand towels) just before feeding or pumping. This increases circulation and helps the milk flow. If your baby does not completely empty your breasts while breastfeeding, pump any extra milk after he or she is finished.  Wear a snug bra (nursing or regular) or tank top for 1 2 days to signal your body to slightly decrease milk production.  Apply ice packs to your breasts, unless this is too uncomfortable for you.  Make sure that your baby is latched on and positioned properly while breastfeeding. If engorgement persists after 48 hours   of following these recommendations, contact your  health care provider or a lactation consultant. OVERALL HEALTH CARE RECOMMENDATIONS WHILE BREASTFEEDING  Eat healthy foods. Alternate between meals and snacks, eating 3 of each per day. Because what you eat affects your breast milk, some of the foods may make your baby more irritable than usual. Avoid eating these foods if you are sure that they are negatively affecting your baby.  Drink milk, fruit juice, and water to satisfy your thirst (about 10 glasses a day).   Rest often, relax, and continue to take your prenatal vitamins to prevent fatigue, stress, and anemia.  Continue breast self-awareness checks.  Avoid chewing and smoking tobacco.  Avoid alcohol and drug use. Some medicines that may be harmful to your baby can pass through breast milk. It is important to ask your health care provider before taking any medicine, including all over-the-counter and prescription medicine as well as vitamin and herbal supplements. It is possible to become pregnant while breastfeeding. If birth control is desired, ask your health care provider about options that will be safe for your baby. SEEK MEDICAL CARE IF:   You feel like you want to stop breastfeeding or have become frustrated with breastfeeding.  You have painful breasts or nipples.  Your nipples are cracked or bleeding.  Your breasts are red, tender, or warm.  You have a swollen area on either breast.  You have a fever or chills.  You have nausea or vomiting.  You have drainage other than breast milk from your nipples.  Your breasts do not become full before feedings by the 5th day after you give birth.  You feel sad and depressed.  Your baby is too sleepy to eat well.  Your baby is having trouble sleeping.   Your baby is wetting less than 3 diapers in a 24-hour period.  Your baby has less than 3 stools in a 24-hour period.  Your baby's skin or the white part of his or her eyes becomes yellow.   Your baby is not gaining  weight by 5 days of age. SEEK IMMEDIATE MEDICAL CARE IF:   Your baby is overly tired (lethargic) and does not want to wake up and feed.  Your baby develops an unexplained fever. Document Released: 09/07/2005 Document Revised: 05/10/2013 Document Reviewed: 03/01/2013 [employer] Patient Information 2014 [employer].  

## 2013-07-31 NOTE — Progress Notes (Signed)
NST reviewed and reactive. FBS 68-80 2 hr pp 90-141 4 are out of range. To f/u with ID this week and pull PICC, stop IV Abx and begin po prophylaxis.

## 2013-08-02 ENCOUNTER — Encounter: Payer: Self-pay | Admitting: Infectious Diseases

## 2013-08-02 ENCOUNTER — Ambulatory Visit (INDEPENDENT_AMBULATORY_CARE_PROVIDER_SITE_OTHER): Payer: Medicaid Other | Admitting: Infectious Diseases

## 2013-08-02 VITALS — BP 121/73 | HR 89 | Temp 98.4°F | Ht 62.0 in | Wt 283.0 lb

## 2013-08-02 DIAGNOSIS — O2390 Unspecified genitourinary tract infection in pregnancy, unspecified trimester: Secondary | ICD-10-CM

## 2013-08-02 DIAGNOSIS — N10 Acute tubulo-interstitial nephritis: Secondary | ICD-10-CM

## 2013-08-02 DIAGNOSIS — A498 Other bacterial infections of unspecified site: Secondary | ICD-10-CM

## 2013-08-02 DIAGNOSIS — R7881 Bacteremia: Secondary | ICD-10-CM

## 2013-08-02 MED ORDER — AMOXICILLIN 500 MG PO CAPS: 500.0000 mg | ORAL_CAPSULE | Freq: Three times a day (TID) | ORAL | Status: DC

## 2013-08-02 NOTE — Progress Notes (Signed)
  Subjective:    Patient ID: Melissa Washington, female    DOB: May 11, 1986, 27 y.o.   MRN: [id number]  HPI 27 y.o. 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 Her course has been complicated by Escherichia coli bacteremia 04-25-13 which was thought to be associated with her Port-A-Cath. This was removed. She finished 2 weeks of ceftriaxone intravenously, and was doing well when seen in followup in the infectious disease clinic (05-25-13).  In mid-September she presented to her obstetrician with symptoms of fever and malaise and was diagnosed with a urinary tract infection with Escherichia coli (> 100k). Blood cultures were not done at that point in time. Patient was sent home on Macrobid to which the organism was sensitive and she states that she felt better while on the Macrobid.  She returned October 16 with fever nausea vomiting, flank pain, and mild headache.  Her blood cultures again grew a pansensitive Escherichia coli in 2 out of 2 cultures. She is also growing Escherichia coli from urine (40k). She underwent MRI of the abdomen to exclude intra-abdominal abscess (-). She also had a 2-D echocardiogram which was suboptimal due to patient's body habitus also (-).  She was d/c home on 10-19 on VI ceftriaxone via midline. This came out ~ 2weeks ago, since replaced with a PIC line. No problems with PIC since.  No fever or chills, passing urine without difficulty. No burning or cloudiness.  Has been getting 1g bid of her anbx.  She took last dose of Ceftriaxone today. Normal urination, normal BM. No problems with PIC. No flank pain.      Review of Systems See above. Baby moving well.     Objective:   Physical Exam  Constitutional: She appears well-developed and well-nourished.  HENT:  Mouth/Throat: No oropharyngeal exudate.  Eyes: EOM are normal. Pupils are equal, round, and reactive to light.  Neck: Neck supple.    Cardiovascular: Normal rate, regular rhythm and normal heart sounds.   Pulmonary/Chest: Effort normal and breath sounds normal.  Abdominal: Soft. Bowel sounds are normal. She exhibits distension. There is no tenderness. There is no CVA tenderness.  Musculoskeletal: She exhibits no edema.       Arms: Lymphadenopathy:    She has no cervical adenopathy.          Assessment & Plan:

## 2013-08-02 NOTE — Assessment & Plan Note (Signed)
See above

## 2013-08-02 NOTE — Assessment & Plan Note (Signed)
Will repeat her BCx today. Will remove her PIC. Will start her on amoxil for next 2 weeks. Will see her back in 2 weeks.

## 2013-08-03 ENCOUNTER — Other Ambulatory Visit: Payer: Medicaid Other

## 2013-08-08 ENCOUNTER — Other Ambulatory Visit: Payer: Medicaid Other

## 2013-08-08 LAB — CULTURE, BLOOD (SINGLE): Organism ID, Bacteria: NO GROWTH

## 2013-08-09 ENCOUNTER — Encounter: Payer: Self-pay | Admitting: *Deleted

## 2013-08-10 ENCOUNTER — Encounter: Payer: Self-pay | Admitting: Obstetrics and Gynecology

## 2013-08-14 ENCOUNTER — Telehealth: Payer: Self-pay | Admitting: Infectious Diseases

## 2013-08-14 ENCOUNTER — Ambulatory Visit: Payer: Medicaid Other | Admitting: Infectious Diseases

## 2013-08-14 ENCOUNTER — Telehealth: Payer: Self-pay | Admitting: *Deleted

## 2013-08-14 NOTE — Telephone Encounter (Signed)
Dr. Ninetta Lights spoke with patient. She has had her PICC pulled, is on oral antibiotics.  Knows to call if she needs [place]. Andree Coss, RN

## 2013-08-14 NOTE — Telephone Encounter (Signed)
Called pt- her PIC is out. She has been transitioned to amox. Still pregnant.

## 2013-08-16 ENCOUNTER — Telehealth: Payer: Self-pay | Admitting: *Deleted

## 2013-08-16 NOTE — Telephone Encounter (Signed)
Message left earlier for pt to return call & she did & was informed of lab results.  She reports that she was seen in the office & then determined that she was seen in may of this year.  She was supposed to be seen by Lonna Cobb NP in 3 mo which was Aug & she states that she must have been in the hospital.  Will discuss f/u with Dr Cyndie Chime.

## 2013-08-16 NOTE — Telephone Encounter (Signed)
Message copied by Sabino Snipes on Wed Aug 16, 2013  2:55 PM ------      Message from: Orbie Hurst      Created: Fri Aug 11, 2013  5:43 PM                   ----- Message -----         From: Levert Feinstein, MD         Sent: 08/06/2013   2:56 PM           To: Orbie Hurst, RN, Amy Bethann Berkshire, RN            Call pt - labs from 11/3 OK  Has she ever been seen in our office?  No notes ------

## 2013-08-21 ENCOUNTER — Telehealth: Payer: Self-pay | Admitting: *Deleted

## 2013-08-21 NOTE — Telephone Encounter (Signed)
Pt left message that her arm keeps going numb and tingling. Would like to talk to someone about this.

## 2013-08-22 NOTE — Telephone Encounter (Signed)
Called patient and spoke with her about her arm. I told her that without a provider seeing her it would be hard to determine exactly what it going on. Pt has not shown for the past few appts. I advised her that it is very important that she is seen in these last weeks of pregnancy. Pt stated that she can't come this week. I advised her to call to schedule her appt as soon as possible as she is almost 38 weeks. Also discussed reasons to come into [facility]U such as baby not moving, bleeding, contractions that are regular. Pt voiced understanding.

## 2013-08-23 ENCOUNTER — Other Ambulatory Visit: Payer: Self-pay | Admitting: Obstetrics & Gynecology

## 2013-08-23 DIAGNOSIS — O169 Unspecified maternal hypertension, unspecified trimester: Secondary | ICD-10-CM

## 2013-08-23 DIAGNOSIS — O24919 Unspecified diabetes mellitus in pregnancy, unspecified trimester: Secondary | ICD-10-CM

## 2013-08-24 ENCOUNTER — Telehealth: Payer: Self-pay | Admitting: *Deleted

## 2013-08-24 ENCOUNTER — Ambulatory Visit (HOSPITAL_COMMUNITY)
Admission: RE | Admit: 2013-08-24 | Discharge: 2013-08-24 | Disposition: A | Discharge status: Home / Self Care | Payer: Medicaid Other | Source: Ambulatory Visit | Attending: Obstetrics & Gynecology | Admitting: Obstetrics & Gynecology

## 2013-08-24 DIAGNOSIS — O24919 Unspecified diabetes mellitus in pregnancy, unspecified trimester: Secondary | ICD-10-CM

## 2013-08-24 DIAGNOSIS — E669 Obesity, unspecified: Secondary | ICD-10-CM | POA: Insufficient documentation

## 2013-08-24 DIAGNOSIS — O169 Unspecified maternal hypertension, unspecified trimester: Secondary | ICD-10-CM

## 2013-08-24 DIAGNOSIS — O10019 Pre-existing essential hypertension complicating pregnancy, unspecified trimester: Secondary | ICD-10-CM | POA: Insufficient documentation

## 2013-08-24 NOTE — Progress Notes (Signed)
Placed on EFM for NST

## 2013-08-24 NOTE — Progress Notes (Signed)
Melissa Washington was seen for ultrasound appointment today.  Please see [facility]-OBGYN report for details.

## 2013-08-24 NOTE — Telephone Encounter (Signed)
Received a call from MFM stating they are seeing Melissa Washington today for ultrasound and nst. Have done ultrasound and it is fine. They noted she had not had a ob fu with [place] since 07/31/13 and wanted to know if we wanted to see her today or when. Per chart review and schedule gave appt for Melissa Washington for Monday 07/29/13 at 0800 which she agreed to.

## 2013-08-24 NOTE — ED Notes (Signed)
Strip review by Dr. Otho Perl; off EFM for discharge; FHR=135bpm.

## 2013-08-24 NOTE — ED Notes (Signed)
Placed on EFM for NST; FHR=130bpm.

## 2013-08-28 ENCOUNTER — Ambulatory Visit (INDEPENDENT_AMBULATORY_CARE_PROVIDER_SITE_OTHER): Payer: Medicaid Other | Admitting: Obstetrics and Gynecology

## 2013-08-28 ENCOUNTER — Encounter (HOSPITAL_COMMUNITY): Payer: Self-pay | Admitting: *Deleted

## 2013-08-28 ENCOUNTER — Encounter: Payer: Self-pay | Admitting: Obstetrics and Gynecology

## 2013-08-28 ENCOUNTER — Telehealth (HOSPITAL_COMMUNITY): Payer: Self-pay | Admitting: *Deleted

## 2013-08-28 VITALS — BP 116/64 | Wt 287.1 lb

## 2013-08-28 DIAGNOSIS — O10019 Pre-existing essential hypertension complicating pregnancy, unspecified trimester: Secondary | ICD-10-CM

## 2013-08-28 DIAGNOSIS — O0990 Supervision of high risk pregnancy, unspecified, unspecified trimester: Secondary | ICD-10-CM

## 2013-08-28 DIAGNOSIS — N10 Acute tubulo-interstitial nephritis: Secondary | ICD-10-CM

## 2013-08-28 DIAGNOSIS — Z6841 Body Mass Index (BMI) 40.0 and over, adult: Secondary | ICD-10-CM

## 2013-08-28 DIAGNOSIS — O24913 Unspecified diabetes mellitus in pregnancy, third trimester: Secondary | ICD-10-CM

## 2013-08-28 DIAGNOSIS — O99213 Obesity complicating pregnancy, third trimester: Secondary | ICD-10-CM

## 2013-08-28 DIAGNOSIS — O10013 Pre-existing essential hypertension complicating pregnancy, third trimester: Secondary | ICD-10-CM

## 2013-08-28 DIAGNOSIS — C9241 Acute promyelocytic leukemia, in remission: Secondary | ICD-10-CM

## 2013-08-28 DIAGNOSIS — O24919 Unspecified diabetes mellitus in pregnancy, unspecified trimester: Secondary | ICD-10-CM

## 2013-08-28 DIAGNOSIS — I10 Essential (primary) hypertension: Secondary | ICD-10-CM

## 2013-08-28 DIAGNOSIS — E1165 Type 2 diabetes mellitus with hyperglycemia: Secondary | ICD-10-CM

## 2013-08-28 DIAGNOSIS — E669 Obesity, unspecified: Secondary | ICD-10-CM

## 2013-08-28 DIAGNOSIS — O2390 Unspecified genitourinary tract infection in pregnancy, unspecified trimester: Secondary | ICD-10-CM

## 2013-08-28 DIAGNOSIS — C9201 Acute myeloblastic leukemia, in remission: Secondary | ICD-10-CM

## 2013-08-28 LAB — POCT URINALYSIS DIP (DEVICE)
Glucose, UA: NEGATIVE mg/dL
Hgb urine dipstick: NEGATIVE
Nitrite: NEGATIVE
Specific Gravity, Urine: 1.02 (ref 1.005–1.030)
Urobilinogen, UA: 0.2 mg/dL (ref 0.0–1.0)

## 2013-08-28 NOTE — Progress Notes (Signed)
IOL scheduled 09/01/13 at 730 am. Dr. Jolayne Panther advised.

## 2013-08-28 NOTE — Progress Notes (Signed)
Pulse- 84 Patient states baby only moves 4-5 times per day for the past week; reports pain/pressure in pelvis and back and irregular contractions

## 2013-08-28 NOTE — Progress Notes (Signed)
Patient doing well without complaints. Patient did not bring log book but reports fasting as high as 120, and 2 hr pp as high as 100. Will schedule for induction of labor at 39 weeks. FM/labor precautions reviewed

## 2013-08-28 NOTE — Telephone Encounter (Signed)
Preadmission screen  

## 2013-08-30 ENCOUNTER — Other Ambulatory Visit: Payer: Self-pay | Admitting: Obstetrics & Gynecology

## 2013-08-30 DIAGNOSIS — O10919 Unspecified pre-existing hypertension complicating pregnancy, unspecified trimester: Secondary | ICD-10-CM

## 2013-08-31 ENCOUNTER — Ambulatory Visit (HOSPITAL_COMMUNITY): Payer: Medicaid Other

## 2013-09-01 ENCOUNTER — Encounter (HOSPITAL_COMMUNITY): Payer: Self-pay

## 2013-09-01 ENCOUNTER — Encounter (HOSPITAL_COMMUNITY): Payer: Medicaid Other | Admitting: Anesthesiology

## 2013-09-01 ENCOUNTER — Inpatient Hospital Stay (HOSPITAL_COMMUNITY)
Admission: RE | Admit: 2013-09-01 | Discharge: 2013-09-03 | DRG: 774 | Disposition: A | Discharge status: Home / Self Care | Payer: Medicaid Other | Source: Ambulatory Visit | Attending: Obstetrics & Gynecology | Admitting: Obstetrics & Gynecology

## 2013-09-01 ENCOUNTER — Inpatient Hospital Stay (HOSPITAL_COMMUNITY): Payer: Medicaid Other | Admitting: Anesthesiology

## 2013-09-01 VITALS — BP 125/91 | HR 88 | Temp 98.5°F | Resp 18 | Ht 62.0 in | Wt 287.0 lb

## 2013-09-01 DIAGNOSIS — O1002 Pre-existing essential hypertension complicating childbirth: Principal | ICD-10-CM | POA: Diagnosis present

## 2013-09-01 DIAGNOSIS — O2432 Unspecified pre-existing diabetes mellitus in childbirth: Secondary | ICD-10-CM | POA: Diagnosis present

## 2013-09-01 DIAGNOSIS — E669 Obesity, unspecified: Secondary | ICD-10-CM | POA: Diagnosis present

## 2013-09-01 DIAGNOSIS — O24919 Unspecified diabetes mellitus in pregnancy, unspecified trimester: Secondary | ICD-10-CM | POA: Diagnosis present

## 2013-09-01 DIAGNOSIS — E119 Type 2 diabetes mellitus without complications: Secondary | ICD-10-CM | POA: Diagnosis present

## 2013-09-01 DIAGNOSIS — I10 Essential (primary) hypertension: Secondary | ICD-10-CM

## 2013-09-01 DIAGNOSIS — O24913 Unspecified diabetes mellitus in pregnancy, third trimester: Secondary | ICD-10-CM

## 2013-09-01 LAB — CBC
HCT: 33.1 % — ABNORMAL LOW (ref 36.0–46.0)
Hemoglobin: 11.4 g/dL — ABNORMAL LOW (ref 12.0–15.0)
MCH: 28.2 pg (ref 26.0–34.0)
MCHC: 35.1 g/dL (ref 30.0–36.0)
MCV: 80.8 fL (ref 78.0–100.0)
Platelets: 216 10*3/uL (ref 150–400)
Platelets: 200 10*3/uL (ref 150–400)
RBC: 3.95 MIL/uL (ref 3.87–5.11)
RDW: 14.7 % (ref 11.5–15.5)
RDW: 14.8 % (ref 11.5–15.5)
WBC: 5.5 10*3/uL (ref 4.0–10.5)
WBC: 8.1 10*3/uL (ref 4.0–10.5)

## 2013-09-01 LAB — TYPE AND SCREEN: Antibody Screen: NEGATIVE

## 2013-09-01 LAB — GLUCOSE, CAPILLARY
Glucose-Capillary: 95 mg/dL (ref 70–99)
Glucose-Capillary: 89 mg/dL (ref 70–99)
Glucose-Capillary: 101 mg/dL — ABNORMAL HIGH (ref 70–99)

## 2013-09-01 LAB — RPR: RPR Ser Ql: NONREACTIVE

## 2013-09-01 MED ORDER — DIPHENHYDRAMINE HCL 50 MG/ML IJ SOLN: 12.5000 mg | INTRAMUSCULAR | Status: DC | PRN

## 2013-09-01 MED ORDER — LACTATED RINGERS IV SOLN
INTRAVENOUS | Status: DC
  Administered 2013-09-01 (×4): via INTRAVENOUS

## 2013-09-01 MED ORDER — EPHEDRINE 5 MG/ML INJ: 10.0000 mg | INTRAVENOUS | Status: DC | PRN

## 2013-09-01 MED ORDER — LIDOCAINE HCL (PF) 1 % IJ SOLN
INTRAMUSCULAR | Status: DC | PRN
  Administered 2013-09-01 (×2): 4 mL

## 2013-09-01 MED ORDER — ACETAMINOPHEN 325 MG PO TABS: 650.0000 mg | ORAL_TABLET | ORAL | Status: DC | PRN

## 2013-09-01 MED ORDER — CITRIC ACID-SODIUM CITRATE 334-500 MG/5ML PO SOLN
30.0000 mL | ORAL | Status: DC | PRN
  Administered 2013-09-01: 30 mL via ORAL
  Filled 2013-09-01: qty 15

## 2013-09-01 MED ORDER — IBUPROFEN 600 MG PO TABS: 600.0000 mg | ORAL_TABLET | Freq: Four times a day (QID) | ORAL | Status: DC | PRN

## 2013-09-01 MED ORDER — PHENYLEPHRINE 40 MCG/ML (10ML) SYRINGE FOR IV PUSH (FOR BLOOD PRESSURE SUPPORT)
80.0000 ug | PREFILLED_SYRINGE | INTRAVENOUS | Status: DC | PRN
  Filled 2013-09-01: qty 10

## 2013-09-01 MED ORDER — LACTATED RINGERS IV SOLN
500.0000 mL | INTRAVENOUS | Status: DC | PRN
  Administered 2013-09-01: 500 mL via INTRAVENOUS

## 2013-09-01 MED ORDER — BUTORPHANOL TARTRATE 1 MG/ML IJ SOLN
2.0000 mg | INTRAMUSCULAR | Status: DC | PRN
  Administered 2013-09-01: 2 mg via INTRAVENOUS
  Filled 2013-09-01: qty 2

## 2013-09-01 MED ORDER — LIDOCAINE HCL (PF) 1 % IJ SOLN
30.0000 mL | INTRAMUSCULAR | Status: DC | PRN
  Filled 2013-09-01: qty 30

## 2013-09-01 MED ORDER — EPHEDRINE 5 MG/ML INJ
10.0000 mg | INTRAVENOUS | Status: DC | PRN
  Filled 2013-09-01: qty 4

## 2013-09-01 MED ORDER — ONDANSETRON HCL 4 MG/2ML IJ SOLN
4.0000 mg | Freq: Four times a day (QID) | INTRAMUSCULAR | Status: DC | PRN
  Administered 2013-09-01: 4 mg via INTRAVENOUS
  Filled 2013-09-01: qty 2

## 2013-09-01 MED ORDER — OXYTOCIN 40 UNITS IN LACTATED RINGERS INFUSION - SIMPLE MED
1.0000 m[IU]/min | INTRAVENOUS | Status: DC
  Administered 2013-09-01: 2 m[IU]/min via INTRAVENOUS
  Administered 2013-09-01: 10 m[IU]/min via INTRAVENOUS

## 2013-09-01 MED ORDER — TERBUTALINE SULFATE 1 MG/ML IJ SOLN: 0.2500 mg | Freq: Once | INTRAMUSCULAR | Status: AC | PRN

## 2013-09-01 MED ORDER — ZOLPIDEM TARTRATE 5 MG PO TABS: 5.0000 mg | ORAL_TABLET | Freq: Every evening | ORAL | Status: DC | PRN

## 2013-09-01 MED ORDER — FENTANYL CITRATE 0.05 MG/ML IJ SOLN
100.0000 ug | INTRAMUSCULAR | Status: DC | PRN
  Administered 2013-09-01: 100 ug via INTRAVENOUS
  Filled 2013-09-01 (×3): qty 2

## 2013-09-01 MED ORDER — OXYTOCIN BOLUS FROM INFUSION: 500.0000 mL | INTRAVENOUS | Status: DC

## 2013-09-01 MED ORDER — PHENYLEPHRINE 40 MCG/ML (10ML) SYRINGE FOR IV PUSH (FOR BLOOD PRESSURE SUPPORT): 80.0000 ug | PREFILLED_SYRINGE | INTRAVENOUS | Status: DC | PRN

## 2013-09-01 MED ORDER — FLEET ENEMA 7-19 GM/118ML RE ENEM: 1.0000 | ENEMA | RECTAL | Status: DC | PRN

## 2013-09-01 MED ORDER — FENTANYL 2.5 MCG/ML BUPIVACAINE 1/10 % EPIDURAL INFUSION (WH - ANES)
INTRAMUSCULAR | Status: DC | PRN
  Administered 2013-09-01: 13 mL/h via EPIDURAL

## 2013-09-01 MED ORDER — OXYTOCIN 40 UNITS IN LACTATED RINGERS INFUSION - SIMPLE MED
62.5000 mL/h | INTRAVENOUS | Status: DC
  Filled 2013-09-01: qty 1000

## 2013-09-01 MED ORDER — LACTATED RINGERS IV SOLN
500.0000 mL | Freq: Once | INTRAVENOUS | Status: AC
  Administered 2013-09-01: 500 mL via INTRAVENOUS

## 2013-09-01 MED ORDER — MISOPROSTOL 25 MCG QUARTER TABLET
25.0000 ug | ORAL_TABLET | ORAL | Status: DC | PRN
  Administered 2013-09-01: 25 ug via VAGINAL
  Filled 2013-09-01 (×2): qty 0.25

## 2013-09-01 MED ORDER — FENTANYL 2.5 MCG/ML BUPIVACAINE 1/10 % EPIDURAL INFUSION (WH - ANES)
14.0000 mL/h | INTRAMUSCULAR | Status: DC | PRN
  Administered 2013-09-02: 13 mL/h via EPIDURAL
  Filled 2013-09-01 (×2): qty 125

## 2013-09-01 NOTE — Anesthesia Procedure Notes (Signed)
Epidural Patient location during procedure: OB Start time: 09/01/2013 7:00 PM  Staffing Anesthesiologist: Oluwanifemi Susman A. Performed by: anesthesiologist   Preanesthetic Checklist Completed: patient identified, site marked, surgical consent, pre-op evaluation, timeout performed, IV checked, risks and benefits discussed and monitors and equipment checked  Epidural Patient position: sitting Prep: site prepped and draped and DuraPrep Patient monitoring: continuous pulse ox and blood pressure Approach: midline Injection technique: LOR air  Needle:  Needle type: Tuohy  Needle gauge: 17 G Needle length: 9 cm and 9 Needle insertion depth: 8 cm Catheter type: closed end flexible Catheter size: 19 Gauge Catheter at skin depth: 13 cm Test dose: negative and Other  Assessment Events: blood not aspirated, injection not painful, no injection resistance, negative IV test and no paresthesia  Additional Notes Patient identified. Risks and benefits discussed including failed block, incomplete  Pain control, post dural puncture headache, nerve damage, paralysis, blood pressure Changes, nausea, vomiting, reactions to medications-both toxic and allergic and post Partum back pain. All questions were answered. Patient expressed understanding and wished to proceed. Sterile technique was used throughout procedure. Epidural site was Dressed with sterile barrier dressing. No paresthesias, signs of intravascular injection Or signs of intrathecal spread were encountered.  Patient was more comfortable after the epidural was dosed. Please see RN's note for documentation of vital signs and FHR which are stable.

## 2013-09-01 NOTE — Progress Notes (Signed)
Melissa Washington is a 27 y.o. G2P1001 at [redacted]w[redacted]d admitted for induction of labor due to [facility], BDM.  Subjective: Pt reports mild contractions, denies need for pain medication at this time.    Objective: BP 97/46  Pulse 115  Temp(Src) 98.3 F (36.8 C) (Oral)  Resp 18  Ht 5\' 2"  (1.575 m)  Wt 130.182 kg (287 lb)  BMI 52.48 kg/m2  LMP 12/26/2012      FHT:  FHR: 140 bpm, variability: moderate,  accelerations:  Present,  decelerations:  Absent UC:   regular, every 3-4 minutes SVE:   Dilation: 3 Effacement (%): Thick Station: -3 Exam by:: L. Leftwich-Kirby CNM Foley bulb placed at this time.  Pt tolerated well.  Tension applied.    Labs: Lab Results  Component Value Date   WBC 5.5 09/01/2013   HGB 11.2* 09/01/2013   HCT 31.9* 09/01/2013   MCV 80.8 09/01/2013   PLT 216 09/01/2013   CBG (last 3)   Recent Labs  09/01/13 0814 09/01/13 1201  GLUCAP 104* 111*     Assessment / Plan: Induction of labor due to CHTN, BDM Foley Bulb in place  Labor: Progressing normally Preeclampsia:  n/a Fetal Wellbeing:  Category I Pain Control:  Labor support without medications I/D:  n/a Anticipated MOD:  NSVD  LEFTWICH-KIRBY, Zarahi Fuerst 09/01/2013, 2:58 PM

## 2013-09-01 NOTE — H&P (Signed)
Melissa Washington is a 27 y.o. female G2P1001 with IUP at [redacted]w[redacted]d presenting for IOL for Class B DM. P[facility]are at [facility] (transfer from [facility])  since 8 wks  Prenatal History/Complications: Bacteremia third trimester Morbid obesity BMI>50 No PNC first baby; delivered ~ 37 weeks, 5#14oz    Past Medical History: Past Medical History  Diagnosis Date  . Hypertension   . Nephrolithiasis   . Hepatic steatosis 01/17/2012  . Cholelithiasis 01/17/2012  . Headache(784.0)   . Bacteremia associated with IV line 05/01/2013    Port-a-cath infection, removed 8/6 by IR. Ecoli grew out in cultures. Sent home with Ceftriaxone to complete 14 day course.   . Bacteremia due to Escherichia coli 05/01/2013    Port-a-cath infection, removed 8/6 by IR. Ecoli grew out in cultures. Sent home with Ceftriaxone to complete 14 day course.   . 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 Leukemia, acute monocytic, in remission   . Diabetes mellitus     type 2, insulin    Past Surgical History: Past Surgical History  Procedure Laterality Date  . Portacath placement      [facility]  . Cholecystectomy  01/31/2012    Procedure: LAPAROSCOPIC CHOLECYSTECTOMY WITH INTRAOPERATIVE CHOLANGIOGRAM;  Surgeon: Clovis Pu. Cornett, MD;  Location: [facility] OR;  Service: General;  Laterality: N/A;    Obstetrical History: OB History   Grav Para Term Preterm Abortions TAB SAB Ect Mult Living   2 1 1  0 0 0 0 0 0 1       Social History: History   Social History  . Marital Status: Single    Spouse Name: N/A    Number of Children: 1  . Years of Education: 13   Occupational History  . Unemployed   . Personal care aide in past    Social History Main Topics  . Smoking status: Never Smoker   . Smokeless tobacco: Never Used  . Alcohol Use: No  . Drug Use: No  . Sexual Activity: Yes    Partners: Male    Birth Control/ Protection: None   Other Topics Concern  . None   Social History Narrative   Single, lives with her  daughter in [place].    Family History: Family History  Problem Relation Age of Onset  . Kidney disease Mother   . Hypertension Mother   . Epilepsy Mother   . Sleep apnea Mother   . Kidney disease Maternal Grandmother   . Diabetes Maternal Grandmother   . Diabetes Father     Allergies: Allergies  Allergen Reactions  . Ultram [Tramadol Hcl] Nausea And Vomiting    Prescriptions prior to admission  Medication Sig Dispense Refill  . insulin aspart (NOVOLOG) 100 UNIT/ML injection Inject 20 Units into the skin 3 (three) times daily with meals.  10 mL  2  . insulin NPH (HUMULIN N,NOVOLIN N) 100 UNIT/ML injection Inject 15-17 Units into the skin 2 (two) times daily. Inject 15 units in the morning and 17 units at night.  1 vial  2  . Prenatal Multivit-Min-Fe-FA (PRENATAL VITAMINS) 0.8 MG tablet Take 1 tablet by mouth daily.  30 tablet  12     Review of Systems   Constitutional: Negative for fever, chills, weight loss, malaise/fatigue and diaphoresis.  HENT: Negative for hearing loss, ear pain, nosebleeds, congestion, sore throat, neck pain, tinnitus and ear discharge.   Eyes: Negative for blurred vision, double vision, photophobia, pain, discharge and redness.  Respiratory: Negative for cough,  hemoptysis, sputum production, shortness of breath, wheezing and stridor.   Cardiovascular: Negative for chest pain, palpitations, orthopnea,  leg swelling  Gastrointestinal:. Negative for abdominal pain heartburn, nausea, vomiting, diarrhea, constipation, blood in stool Genitourinary: Negative for dysuria, urgency, frequency, hematuria and flank pain.  Musculoskeletal: Negative for myalgias, back pain, joint pain and falls.  Skin: Negative for itching and rash.  Neurological: Negative for dizziness, tingling, tremors, sensory change, speech change, focal weakness, seizures, loss of consciousness, weakness and headaches.  Endo/Heme/Allergies: Negative for environmental allergies and  polydipsia. Does not bruise/bleed easily.  Psychiatric/Behavioral: Negative for depression, suicidal ideas, hallucinations, memory loss and substance abuse. The patient is not nervous/anxious and does not have insomnia.       Blood pressure 113/69, pulse 93, temperature 97.9 F (36.6 C), temperature source Oral, resp. rate 20, height 5\' 2"  (1.575 m), weight 130.182 kg (287 lb), last menstrual period 12/26/2012. General appearance: alert, cooperative, no distress and morbidly obese Lungs: clear to auscultation bilaterally Heart: regular rate and rhythm Abdomen: soft, non-tender; bowel sounds normal Pelvic:FT/50/ -3/soft/midposition Extremities: Homans sign is negative, no sign of DVT DTR's 2+ Presentation: cephalic Fetal monitoringBaseline: 130 bpm, Variability: Good {> 6 bpm), Accelerations: Reactive and Decelerations: Absent Uterine activityNone EFW ~ 7# LAST WEEK     Prenatal labs: ABO, Rh: --/--/B POS, B POS (10/19 2255) Antibody: NEG (10/19 2255) Rubella:  immune RPR: NON REAC (11/03 1228)  HBsAg: NEGATIVE (05/01 1216)  HIV: NON REACTIVE (05/01 1216)  GBS: Negative (10/19 0000)  Genetic screening  normal Anatomy US normal   No results found for this or any previous visit (from the past 24 hour(s)).  Assessment: Melissa Washington is a 27 y.o. G2P1001 with an IUP at [redacted]w[redacted]d presenting for IOL for Class B DM CHTN, no meds  Plan: cytotec  Blood sugars q 2-4 hours; initiate glucostabilizer when active labor  CRESENZO-DISHMAN,Raela Bohl 09/01/2013, 7:35 AM

## 2013-09-01 NOTE — Progress Notes (Signed)
Melissa Washington is a 27 y.o. G2P1001 at [redacted]w[redacted]d admitted for induction of labor due to [facility], BDM.  Subjective: Pt reports moderate contractions s/p IV pain meds. No other complaints.    Objective: BP 133/71  Pulse 69  Temp(Src) 98.2 F (36.8 C) (Oral)  Resp 20  Ht 5\' 2"  (1.575 m)  Wt 130.182 kg (287 lb)  BMI 52.48 kg/m2  LMP 12/26/2012    FHT:  FHR: 130 bpm, variability: moderate,  accelerations:  Abscent,  decelerations:  Absent UC:   regular, every 3-4 minutes SVE:   Dilation: 4.5 Effacement (%): 50 Station: -3;-2 Exam by:: Dr. Inaki Washington/ Melissa Washington RNC Foley bulb removed prior to digital exam.  Labs: Lab Results  Component Value Date   WBC 5.5 09/01/2013   HGB 11.2* 09/01/2013   HCT 31.9* 09/01/2013   MCV 80.8 09/01/2013   PLT 216 09/01/2013   CBG (last 3)   Recent Labs  09/01/13 0814 09/01/13 1201 09/01/13 1558  GLUCAP 104* 111* 95    Assessment / Plan: Induction of labor due to CHTN, BDM s/p foley bulb  Labor: Progressing normally, will start Pitocin 2x2 Preeclampsia:  n/a Fetal Wellbeing:  Category I Pain Control:  Fentanyl and would like to start epidural after Pitocin started I/D:  n/a Anticipated MOD:  NSVD  Melissa Washington, Melissa Washington 09/01/2013, 6:15 PM

## 2013-09-01 NOTE — Anesthesia Preprocedure Evaluation (Addendum)
Anesthesia Evaluation  Patient identified by MRN, date of birth, ID band Patient awake    Reviewed: Allergy & Precautions, H&P , Patient's Chart, lab work & pertinent test results  Airway Mallampati: III TM Distance: >3 FB Neck ROM: Full    Dental no notable dental hx. (+) Teeth Intact   Pulmonary neg pulmonary ROS,  breath sounds clear to auscultation  Pulmonary exam normal       Cardiovascular hypertension, Pt. on medications Rhythm:Regular Rate:Normal     Neuro/Psych  Headaches, negative psych ROS   GI/Hepatic GERD-  Medicated and Controlled,Hepatic steatosis   Endo/Other  diabetes, Poorly Controlled, Type 2, Insulin DependentMorbid obesity  Renal/GU Renal InsufficiencyRenal diseaseUrosepsis during pregnancy  negative genitourinary   Musculoskeletal negative musculoskeletal ROS (+)   Abdominal (+) + obese,   Peds  Hematology  (+) Blood dyscrasia, , Acute Promyelocytic Leukemia during early pregnancy- now in remission   Anesthesia Other Findings   Reproductive/Obstetrics (+) Pregnancy                          Anesthesia Physical Anesthesia Plan  ASA: III  Anesthesia Plan: Epidural   Post-op Pain Management:    Induction:   Airway Management Planned: Natural Airway  Additional Equipment:   Intra-op Plan:   Post-operative Plan:   Informed Consent: I have reviewed the patients History and Physical, chart, labs and discussed the procedure including the risks, benefits and alternatives for the proposed anesthesia with the patient or authorized representative who has indicated his/her understanding and acceptance.     Plan Discussed with: Anesthesiologist  Anesthesia Plan Comments:         Anesthesia Quick Evaluation

## 2013-09-02 ENCOUNTER — Encounter (HOSPITAL_COMMUNITY): Payer: Self-pay

## 2013-09-02 DIAGNOSIS — E119 Type 2 diabetes mellitus without complications: Secondary | ICD-10-CM

## 2013-09-02 DIAGNOSIS — O2432 Unspecified pre-existing diabetes mellitus in childbirth: Secondary | ICD-10-CM

## 2013-09-02 DIAGNOSIS — O1002 Pre-existing essential hypertension complicating childbirth: Secondary | ICD-10-CM

## 2013-09-02 MED ORDER — TETANUS-DIPHTH-ACELL PERTUSSIS 5-2.5-18.5 LF-MCG/0.5 IM SUSP
0.5000 mL | Freq: Once | INTRAMUSCULAR | Status: AC
  Administered 2013-09-03: 0.5 mL via INTRAMUSCULAR
  Filled 2013-09-02: qty 0.5

## 2013-09-02 MED ORDER — WITCH HAZEL-GLYCERIN EX PADS: 1.0000 "application " | MEDICATED_PAD | CUTANEOUS | Status: DC | PRN

## 2013-09-02 MED ORDER — LANOLIN HYDROUS EX OINT: TOPICAL_OINTMENT | CUTANEOUS | Status: DC | PRN

## 2013-09-02 MED ORDER — OXYTOCIN 10 UNIT/ML IJ SOLN: 10.0000 [IU] | Freq: Once | INTRAMUSCULAR | Status: DC

## 2013-09-02 MED ORDER — DIPHENHYDRAMINE HCL 25 MG PO CAPS: 25.0000 mg | ORAL_CAPSULE | Freq: Four times a day (QID) | ORAL | Status: DC | PRN

## 2013-09-02 MED ORDER — IBUPROFEN 600 MG PO TABS
600.0000 mg | ORAL_TABLET | Freq: Four times a day (QID) | ORAL | Status: DC
  Administered 2013-09-02 – 2013-09-03 (×6): 600 mg via ORAL
  Filled 2013-09-02 (×5): qty 1

## 2013-09-02 MED ORDER — OXYTOCIN 10 UNIT/ML IJ SOLN
INTRAMUSCULAR | Status: AC
  Filled 2013-09-02: qty 1

## 2013-09-02 MED ORDER — ONDANSETRON HCL 4 MG/2ML IJ SOLN: 4.0000 mg | INTRAMUSCULAR | Status: DC | PRN

## 2013-09-02 MED ORDER — DIBUCAINE 1 % RE OINT
1.0000 "application " | TOPICAL_OINTMENT | RECTAL | Status: DC | PRN
  Filled 2013-09-02: qty 28

## 2013-09-02 MED ORDER — ZOLPIDEM TARTRATE 5 MG PO TABS: 5.0000 mg | ORAL_TABLET | Freq: Every evening | ORAL | Status: DC | PRN

## 2013-09-02 MED ORDER — OXYCODONE-ACETAMINOPHEN 5-325 MG PO TABS
1.0000 | ORAL_TABLET | ORAL | Status: DC | PRN
  Administered 2013-09-02: 2 via ORAL
  Administered 2013-09-02: 1 via ORAL
  Administered 2013-09-02 – 2013-09-03 (×2): 2 via ORAL
  Filled 2013-09-02 (×3): qty 2

## 2013-09-02 MED ORDER — ONDANSETRON HCL 4 MG PO TABS: 4.0000 mg | ORAL_TABLET | ORAL | Status: DC | PRN

## 2013-09-02 MED ORDER — SENNOSIDES-DOCUSATE SODIUM 8.6-50 MG PO TABS
2.0000 | ORAL_TABLET | ORAL | Status: DC
  Administered 2013-09-02: 2 via ORAL
  Filled 2013-09-02: qty 2

## 2013-09-02 MED ORDER — PNEUMOCOCCAL VAC POLYVALENT 25 MCG/0.5ML IJ INJ
0.5000 mL | INJECTION | INTRAMUSCULAR | Status: AC
  Administered 2013-09-03: 0.5 mL via INTRAMUSCULAR
  Filled 2013-09-02: qty 0.5

## 2013-09-02 MED ORDER — BENZOCAINE-MENTHOL 20-0.5 % EX AERO
1.0000 "application " | INHALATION_SPRAY | CUTANEOUS | Status: DC | PRN
  Filled 2013-09-02: qty 56

## 2013-09-02 MED ORDER — SIMETHICONE 80 MG PO CHEW
80.0000 mg | CHEWABLE_TABLET | ORAL | Status: DC | PRN
Start: 2013-09-02 — End: 2013-09-03

## 2013-09-02 MED ORDER — PRENATAL MULTIVITAMIN CH
1.0000 | ORAL_TABLET | Freq: Every day | ORAL | Status: DC
  Administered 2013-09-02 – 2013-09-03 (×2): 1 via ORAL
  Filled 2013-09-02: qty 1

## 2013-09-02 NOTE — Anesthesia Postprocedure Evaluation (Signed)
  Anesthesia Post-op Note  Patient: Melissa Washington  Procedure(s) Performed: * No procedures listed *  Patient Location: Mother/Baby  Anesthesia Type:Epidural  Level of Consciousness: awake  Airway and Oxygen Therapy: Patient Spontanous Breathing  Post-op Pain: mild  Post-op Assessment: Patient's Cardiovascular Status Stable and Respiratory Function Stable  Post-op Vital Signs: stable  Complications: No apparent anesthesia complications

## 2013-09-02 NOTE — Progress Notes (Signed)
Melissa Washington is a 27 y.o. G2P1001 at [redacted]w[redacted]d   Subjective: Pt appears to be sleeping; not awakened; has epidural  Objective: BP 132/51  Pulse 77  Temp(Src) 98.4 F (36.9 C) (Oral)  Resp 18  Ht 5\' 2"  (1.575 m)  Wt 130.182 kg (287 lb)  BMI 52.48 kg/m2  SpO2 100%  LMP 12/26/2012   Total I/O In: -  Out: 350 [Urine:350]  FHT:  FHR: 135 bpm, variability: moderate,  accelerations:  Present,  decelerations:  Absent- accels 10x10, occ mi variables with early onset, most likely head compression UC:   irregular, every 3-6 minutes with Pitocin at 3mu/min SVE:   Dilation: 6 Effacement (%): 80 Station: -1 Exam by:: GPayne, RN- last exam @ 2345; not reexamined  Labs: Lab Results  Component Value Date   WBC 8.1 09/01/2013   HGB 11.4* 09/01/2013   HCT 33.1* 09/01/2013   MCV 81.9 09/01/2013   PLT 200 09/01/2013   CBG: 101  Assessment / Plan: Early active labor  Will continue to increase Pitocin to achieve reg ctx pattern Reexamine cx in 1-2 hours  SHAW, KIMBERLY 09/02/2013, 12:36 AM

## 2013-09-03 LAB — GLUCOSE, CAPILLARY

## 2013-09-03 MED ORDER — METFORMIN HCL 500 MG PO TABS: 1000.0000 mg | ORAL_TABLET | Freq: Two times a day (BID) | ORAL | Status: DC

## 2013-09-03 MED ORDER — METFORMIN HCL 1000 MG PO TABS: 1000.0000 mg | ORAL_TABLET | Freq: Two times a day (BID) | ORAL | Status: DC

## 2013-09-03 MED ORDER — IBUPROFEN 600 MG PO TABS: 600.0000 mg | ORAL_TABLET | Freq: Four times a day (QID) | ORAL | Status: DC

## 2013-09-03 MED ORDER — METFORMIN HCL 500 MG PO TABS
1000.0000 mg | ORAL_TABLET | Freq: Two times a day (BID) | ORAL | Status: DC
  Administered 2013-09-03: 1000 mg via ORAL
  Filled 2013-09-03 (×3): qty 2

## 2013-09-03 NOTE — Discharge Summary (Signed)
Obstetric Discharge Summary Reason for Admission: induction of labor for cHTN and DM-B Prenatal Procedures: NST Intrapartum Procedures: spontaneous vaginal delivery Postpartum Procedures: none Complications-Operative and Postpartum: none HGB  Date Value Range Status  07/24/2013 11.1* 11.6 - 15.9 g/dL Final     Hemoglobin  Date Value Range Status  09/01/2013 11.4* 12.0 - 15.0 g/dL Final     HCT  Date Value Range Status  09/01/2013 33.1* 36.0 - 46.0 % Final  07/24/2013 33.6* 34.8 - 46.6 % Final    Physical Exam:  General: alert, cooperative, appears stated age and no distress Lochia: appropriate Uterine Fundus: firm Incision: NA DVT Evaluation: No evidence of DVT seen on physical exam. Negative Homan's sign. No cords or calf tenderness. No significant calf/ankle edema.  Discharge Diagnoses: Term Pregnancy-delivered and CHTN, and DM-B  Discharge Information: Date: 09/03/2013 Activity: pelvic rest Diet: routine Medications: PNV, Ibuprofen and metformin 1000mg  BID Condition: stable Instructions: refer to practice specific booklet Discharge to: home Follow-up Information   Follow up with [facility]. Schedule an appointment as soon as possible for a visit in 4 weeks.   Specialty:  Obstetrics and Gynecology   Contact information:   [address] [place] [address] [phone]     Pt was admitted for IOL 2/2 CHTN and DM-B on 12/12. Pt progressed to a successful NSVD without tears or other complication on 12/13 at 0438. Pt met all milestones PP. BPs have been mostly less than 140/90 and sugars today fasting and 2hr PP both well below goal. Will discharge on metformin (low risk of bottoming out) plan to monitor sugars at home. Will also have [facility] Nurse evaluate mother for BP and review Glucose log. Pt will need to establish care with [facility] for continued Glucose and HTN management. MOC- Mirena, MOF Bottle.   Newborn Data: Live born female  Birth  Weight: 7 lb 14.8 oz (3595 g) APGAR: 5, 8  Home with mother.  Tawana Scale 09/03/2013, 12:54 PM

## 2013-09-03 NOTE — Discharge Summary (Signed)
`````  Attestation of Attending Supervision of Advanced Practitioner: Evaluation and management procedures were performed by the PA/NP/CNM/OB Fellow under my supervision/collaboration. Chart reviewed and agree with management and plan.  Anniebelle Devore V 09/03/2013 6:06 PM

## 2013-09-03 NOTE — Progress Notes (Signed)
Post Partum Day 1 Subjective: no complaints, up ad lib, voiding, tolerating PO, + flatus and Pt blood pressures well controlled. sugars not checked since delivery.  Objective: Blood pressure 125/91, pulse 88, temperature 98.5 F (36.9 C), temperature source Oral, resp. rate 18, height 5\' 2"  (1.575 m), weight 130.182 kg (287 lb), last menstrual period 12/26/2012, SpO2 98.00%, unknown if currently breastfeeding.  Physical Exam:  General: alert, cooperative, appears stated age and no distress Lochia: appropriate Uterine Fundus: Firm @U +1 Incision: NA DVT Evaluation: No evidence of DVT seen on physical exam. Negative Homan's sign. No cords or calf tenderness. No significant calf/ankle edema.   Recent Labs  09/01/13 0720 09/01/13 1820  HGB 11.2* 11.4*  HCT 31.9* 33.1*    Assessment/Plan: Plan for discharge tomorrow and Contraception Mirena Bottle feeding Pain controlled on PO meds (motrin) Pt BPs in mild range to normal. No meds at this time. Will follow up with [facility] for evaluation. If needed will start on lisinopril. DM-B: Pt previously on insulin. Will start taking sugars at this time. Pt previously on metformin and will restart 1000mg  BID today.   LOS: 2 days   Melissa Washington RYAN 09/03/2013, 9:48 AM

## 2013-09-06 ENCOUNTER — Telehealth: Payer: Self-pay | Admitting: General Practice

## 2013-09-06 NOTE — Telephone Encounter (Signed)
Patient called and left message stating she would like a better medication for the cramping she is having. She was given Rx for motrin after she had the baby on Saturday and it is not working at all. She is having bad cramps and is bleeding and the percocet they gave her in the hospital really seemed to help with the pain and sometimes the pain gets so bad she feels like she cannot move. Per chart review patient delivered 12/13 by SVD with no tears or lacerations. Called patient, no answer- left message stating I was returning her phone call, please give [place] a call back at the clinics

## 2013-09-07 NOTE — Telephone Encounter (Signed)
I called and spoke w/pt regarding her abdominal cramping and request for Percocet Rx.  She stated that she has been taking ibuprofen as prescribed and has even taken 2 tabs at a time. I advised pt that this is an unsafe dose as she can cause damage to her kidneys. I asked her to stick to the original dosage instructions. She stated  That the only thing that works for her pain is the Percocet that she received in the hospital. I advised her that it is normal to have some cramping after childbirth.  Pt then stated that the pain is not just light cramping and asked what she is supposed to do about it because extra strength Tylenol is not working either. I told pt that Percocet is not usually given this far out from uncomplicated vaginal delivery. We will need to see her for evaluation. I asked if she could come to the clinic this afternoon. She replied no because she does not have any gas. I then gave her an appt in clinic tomorrow @ [phone] with Dr. Reola Calkins. Pt voiced understanding.

## 2013-09-07 NOTE — H&P (Signed)
Attestation of Attending Supervision of Advanced Practitioner (CNM/NP): Evaluation and management procedures were performed by the Advanced Practitioner under my supervision and collaboration. I have reviewed the Advanced Practitioner's note and chart, and I agree with the management and plan.  Linn Goetze H. 11:10 AM   

## 2013-09-08 ENCOUNTER — Encounter (HOSPITAL_COMMUNITY): Payer: Self-pay | Admitting: Emergency Medicine

## 2013-09-08 ENCOUNTER — Emergency Department (HOSPITAL_COMMUNITY): Payer: Medicaid Other

## 2013-09-08 ENCOUNTER — Emergency Department (HOSPITAL_COMMUNITY)
Admission: EM | Admit: 2013-09-08 | Discharge: 2013-09-08 | Disposition: A | Discharge status: Home / Self Care | Payer: Medicaid Other | Attending: Emergency Medicine | Admitting: Emergency Medicine

## 2013-09-08 ENCOUNTER — Ambulatory Visit: Payer: Medicaid Other | Admitting: Family Medicine

## 2013-09-08 DIAGNOSIS — R109 Unspecified abdominal pain: Secondary | ICD-10-CM

## 2013-09-08 DIAGNOSIS — Z856 Personal history of leukemia: Secondary | ICD-10-CM | POA: Insufficient documentation

## 2013-09-08 DIAGNOSIS — Z87440 Personal history of urinary (tract) infections: Secondary | ICD-10-CM | POA: Insufficient documentation

## 2013-09-08 DIAGNOSIS — Z79899 Other long term (current) drug therapy: Secondary | ICD-10-CM | POA: Insufficient documentation

## 2013-09-08 DIAGNOSIS — Z8619 Personal history of other infectious and parasitic diseases: Secondary | ICD-10-CM | POA: Insufficient documentation

## 2013-09-08 DIAGNOSIS — Z8719 Personal history of other diseases of the digestive system: Secondary | ICD-10-CM | POA: Insufficient documentation

## 2013-09-08 DIAGNOSIS — Z8701 Personal history of pneumonia (recurrent): Secondary | ICD-10-CM | POA: Insufficient documentation

## 2013-09-08 DIAGNOSIS — R1012 Left upper quadrant pain: Secondary | ICD-10-CM | POA: Insufficient documentation

## 2013-09-08 DIAGNOSIS — N898 Other specified noninflammatory disorders of vagina: Secondary | ICD-10-CM | POA: Insufficient documentation

## 2013-09-08 DIAGNOSIS — O2390 Unspecified genitourinary tract infection in pregnancy, unspecified trimester: Secondary | ICD-10-CM | POA: Insufficient documentation

## 2013-09-08 DIAGNOSIS — N390 Urinary tract infection, site not specified: Secondary | ICD-10-CM

## 2013-09-08 DIAGNOSIS — R000 Tachycardia, unspecified: Secondary | ICD-10-CM | POA: Insufficient documentation

## 2013-09-08 DIAGNOSIS — Z3202 Encounter for pregnancy test, result negative: Secondary | ICD-10-CM | POA: Insufficient documentation

## 2013-09-08 DIAGNOSIS — E119 Type 2 diabetes mellitus without complications: Secondary | ICD-10-CM | POA: Insufficient documentation

## 2013-09-08 DIAGNOSIS — I10 Essential (primary) hypertension: Secondary | ICD-10-CM | POA: Insufficient documentation

## 2013-09-08 LAB — COMPREHENSIVE METABOLIC PANEL
ALT: 23 U/L (ref 0–35)
Albumin: 2.9 g/dL — ABNORMAL LOW (ref 3.5–5.2)
BUN: 8 mg/dL (ref 6–23)
Calcium: 8.9 mg/dL (ref 8.4–10.5)
Chloride: 103 mEq/L (ref 96–112)
Creatinine, Ser: 0.95 mg/dL (ref 0.50–1.10)
Sodium: 139 mEq/L (ref 135–145)
Total Bilirubin: 0.9 mg/dL (ref 0.3–1.2)
Total Protein: 6.6 g/dL (ref 6.0–8.3)

## 2013-09-08 LAB — URINALYSIS, ROUTINE W REFLEX MICROSCOPIC
Glucose, UA: NEGATIVE mg/dL
Protein, ur: 100 mg/dL — AB

## 2013-09-08 LAB — CBC WITH DIFFERENTIAL/PLATELET
Basophils Relative: 0 % (ref 0–1)
Eosinophils Absolute: 0.1 10*3/uL (ref 0.0–0.7)
Eosinophils Relative: 2 % (ref 0–5)
HCT: 32.5 % — ABNORMAL LOW (ref 36.0–46.0)
Hemoglobin: 11.4 g/dL — ABNORMAL LOW (ref 12.0–15.0)
Lymphocytes Relative: 14 % (ref 12–46)
MCH: 29.1 pg (ref 26.0–34.0)
MCHC: 35.1 g/dL (ref 30.0–36.0)
MCV: 82.9 fL (ref 78.0–100.0)
Monocytes Absolute: 0.5 10*3/uL (ref 0.1–1.0)
Monocytes Relative: 6 % (ref 3–12)

## 2013-09-08 LAB — URINE MICROSCOPIC-ADD ON

## 2013-09-08 LAB — WET PREP, GENITAL: Clue Cells Wet Prep HPF POC: NONE SEEN

## 2013-09-08 LAB — POCT PREGNANCY, URINE: Preg Test, Ur: NEGATIVE

## 2013-09-08 MED ORDER — MORPHINE SULFATE 4 MG/ML IJ SOLN: 4.0000 mg | Freq: Once | INTRAMUSCULAR | Status: DC

## 2013-09-08 MED ORDER — ONDANSETRON 4 MG PO TBDP
4.0000 mg | ORAL_TABLET | Freq: Once | ORAL | Status: AC
  Administered 2013-09-08: 4 mg via ORAL
  Filled 2013-09-08: qty 1

## 2013-09-08 MED ORDER — ONDANSETRON HCL 4 MG PO TABS: 4.0000 mg | ORAL_TABLET | Freq: Three times a day (TID) | ORAL | Status: DC | PRN

## 2013-09-08 MED ORDER — IOHEXOL 350 MG/ML SOLN
100.0000 mL | Freq: Once | INTRAVENOUS | Status: AC | PRN
  Administered 2013-09-08: 100 mL via INTRAVENOUS

## 2013-09-08 MED ORDER — ONDANSETRON HCL 4 MG/2ML IJ SOLN: 4.0000 mg | Freq: Once | INTRAMUSCULAR | Status: DC

## 2013-09-08 MED ORDER — DEXTROSE 5 % IV SOLN
1.0000 g | Freq: Once | INTRAVENOUS | Status: AC
  Administered 2013-09-08: 1 g via INTRAVENOUS
  Filled 2013-09-08: qty 10

## 2013-09-08 MED ORDER — MORPHINE SULFATE 4 MG/ML IJ SOLN
4.0000 mg | Freq: Once | INTRAMUSCULAR | Status: AC
  Administered 2013-09-08: 4 mg via INTRAMUSCULAR
  Filled 2013-09-08: qty 1

## 2013-09-08 MED ORDER — OXYCODONE-ACETAMINOPHEN 5-325 MG PO TABS: 1.0000 | ORAL_TABLET | ORAL | Status: DC | PRN

## 2013-09-08 MED ORDER — HYDROMORPHONE HCL PF 1 MG/ML IJ SOLN
1.0000 mg | Freq: Once | INTRAMUSCULAR | Status: AC
  Administered 2013-09-08: 1 mg via INTRAVENOUS
  Filled 2013-09-08: qty 1

## 2013-09-08 MED ORDER — MORPHINE SULFATE 4 MG/ML IJ SOLN
4.0000 mg | Freq: Once | INTRAMUSCULAR | Status: AC
  Administered 2013-09-08: 4 mg via INTRAVENOUS
  Filled 2013-09-08: qty 1

## 2013-09-08 NOTE — ED Provider Notes (Signed)
Medical screening examination/treatment/procedure(s) were performed by non-physician practitioner and as supervising physician I was immediately available for consultation/collaboration.  EKG Interpretation   None         Latasha Puskas N Kenedee Molesky, DO 09/08/13 2330 

## 2013-09-08 NOTE — ED Provider Notes (Addendum)
CSN: [id number]     Arrival date & time 09/08/13  1354 History   First MD Initiated Contact with Patient 09/08/13 1558     Chief Complaint  Patient presents with  . Abdominal Pain   (Consider location/radiation/quality/duration/timing/severity/associated sxs/prior Treatment) HPI  This is a 27 year old female status post normal vaginal delivery of a healthy ED coral on 09/02/2013 who presents emergency Department with chief complaint of severe left sided abdominal pain as well as excessive vaginal bleeding.  She has a past medical history significant for hypertension, diabetes, promyelocytic leukemia in remission, nephrolithiasis and is status post cholecystectomy and May of 2013.  The patient states that 2 days ago she had sudden onset severe left upper quadrant abdominal pain which has been progressively worsening.  She rates the pain at 8/10.  She states it is pleuritic, all worse with deep inhalation.  She has a slight cough that began today.  She states the pain takes her breath.  She denies any history of DVT or embolus.  She denies any hemoptysis.  Patient denies any nausea or vomiting.  She does endorse constipation and had one small bowel movement yesterday.  The patient has a history of previous pneumonia.  She states she felt like she had a subjective fever today.  States that her pain is severe and similar to her previous lower lobe pneumonia states it may also be a kidney stone. Patient also complains of vaginal bleeding and cramping.  She states that she is soaking 1 pad per hour.  Past Medical History  Diagnosis Date  . Hypertension   . Nephrolithiasis   . Hepatic steatosis 01/17/2012  . Cholelithiasis 01/17/2012  . Headache(784.0)   . Bacteremia associated with IV line 05/01/2013    Port-a-cath infection, removed 8/6 by IR. Ecoli grew out in cultures. Sent home with Ceftriaxone to complete 14 day course.   . Bacteremia due to Escherichia coli 05/01/2013    Port-a-cath infection,  removed 8/6 by IR. Ecoli grew out in cultures. Sent home with Ceftriaxone to complete 14 day course.   . 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 Leukemia, acute monocytic, in remission   . Diabetes mellitus     type 2, insulin   Past Surgical History  Procedure Laterality Date  . Portacath placement      [facility]  . Cholecystectomy  01/31/2012    Procedure: LAPAROSCOPIC CHOLECYSTECTOMY WITH INTRAOPERATIVE CHOLANGIOGRAM;  Surgeon: Clovis Pu. Cornett, MD;  Location: [facility] OR;  Service: General;  Laterality: N/A;   Family History  Problem Relation Age of Onset  . Kidney disease Mother   . Hypertension Mother   . Epilepsy Mother   . Sleep apnea Mother   . Kidney disease Maternal Grandmother   . Diabetes Maternal Grandmother   . Diabetes Father    History  Substance Use Topics  . Smoking status: Never Smoker   . Smokeless tobacco: Never Used  . Alcohol Use: No   OB History   Grav Para Term Preterm Abortions TAB SAB Ect Mult Living   2 2 2  0 0 0 0 0 0 2     Review of Systems Ten systems reviewed and are negative for acute change, except as noted in the HPI.   Allergies  Ultram  Home Medications   Current Outpatient Rx  Name  Route  Sig  Dispense  Refill  . ibuprofen (ADVIL,MOTRIN) 600 MG tablet   Oral   Take 1 tablet (600 mg total)  by mouth every 6 (six) hours.   30 tablet   0   . metFORMIN (GLUCOPHAGE) 1000 MG tablet   Oral   Take 1 tablet (1,000 mg total) by mouth 2 (two) times daily with a meal.   90 tablet   2    BP 123/70  Pulse 109  Temp(Src) 98.2 F (36.8 C) (Oral)  Resp 18  Wt 269 lb 3 oz (122.103 kg)  SpO2 98% Physical Exam  Nursing note and vitals reviewed. Constitutional: She is oriented to person, place, and time.  The patient appears uncomfortable.  She in intermittently grabs her left upper abdomen and groin since.  She is unable to speak during the episodes of pain.  HENT:  Head: Normocephalic and atraumatic.  Eyes: Conjunctivae  are normal.  Neck: Normal range of motion. Neck supple. No JVD present.  Cardiovascular: Regular rhythm.   Tachycardic  Pulmonary/Chest: She has no wheezes. She has no rales.  Shallow effort Exam limited by inability to take a deep breath as well as body habitus.  Abdominal: Soft. She exhibits no distension. There is tenderness (tender to palpation left upper abdominal quadrant.).  Musculoskeletal: Normal range of motion.  Neurological: She is alert and oriented to person, place, and time.  Skin: Skin is warm and dry.    ED Course  Procedures (including critical care time) Labs Review Labs Reviewed  CBC WITH DIFFERENTIAL - Abnormal; Notable for the following:    Hemoglobin 11.4 (*)    HCT 32.5 (*)    Neutrophils Relative % 79 (*)    All other components within normal limits  COMPREHENSIVE METABOLIC PANEL - Abnormal; Notable for the following:    Potassium 3.3 (*)    Glucose, Bld 129 (*)    Albumin 2.9 (*)    Alkaline Phosphatase 177 (*)    GFR calc non Af Amer 83 (*)    All other components within normal limits  WET PREP, GENITAL  GC/CHLAMYDIA PROBE AMP  URINALYSIS, ROUTINE W REFLEX MICROSCOPIC  POCT PREGNANCY, URINE   Imaging Review No results found.  EKG Interpretation   None       MDM   1. Abdominal pain   2. UTI (lower urinary tract infection)   3. Postpartum bleeding    Patient here with complaint of severe left upper quadrant pain.  Differential diagnosis includes pulmonary embolus, constipation, left lower lobe pneumonia.  Patient also has vaginal bleeding. Pelvic exam pending.  May need to rule possible retained products of conception.  Review of her labs shows stable and slightly improving hemoglobin.  6:04 PM BP 123/70  Pulse 109  Temp(Src) 98.2 F (36.8 C) (Oral)  Resp 18  Wt 269 lb 3 oz (122.103 kg)  SpO2 98% Pelvic exam:  VULVA: normal appearing vulva with no masses, tenderness or lesions, Gross Blood present VAGINA: normal appearing vagina  with normal color and discharge, no lesions, Foul odor CERVIX: DNA probe for chlamydia and GC obtained, cervical motion tenderness absent, multiparous os, postpartum os, normal lochia, UTERUS: uterus enlarged several cm above pubic symphysis ADNEXA: normal adnexa in size, nontender and no masses, exam limited by body habitus, exam chaperoned.    10:04 PM BP 126/77  Pulse 78  Temp(Src) 98.2 F (36.8 C) (Oral)  Resp 18  Wt 269 lb 3 oz (122.103 kg)  SpO2 98% CT chest/Abd/Pelvis is without acute abnormality. The patient has uti but no other intrabdominal or intrathroacic abnormality to account for her sxs today. I feel the patient  is safe for d/c at this time with pain medication. She has f/u with her OB GYN this coming week.  Arthor Captain, PA-C 09/08/13 2206  Arthor Captain, PA-C 09/09/13 1032

## 2013-09-08 NOTE — ED Notes (Signed)
Pt delivered baby vaginally on 09/02/13. C/o severe L side abd pain since. Reports vaginal bleeding soaking a pad every few hours. Called obgyn but could not make it to the appt they gave her.

## 2013-09-09 LAB — URINE CULTURE

## 2013-09-09 LAB — GC/CHLAMYDIA PROBE AMP: CT Probe RNA: NEGATIVE

## 2013-09-10 NOTE — ED Provider Notes (Signed)
Medical screening examination/treatment/procedure(s) were performed by non-physician practitioner and as supervising physician I was immediately available for consultation/collaboration.  EKG Interpretation   None         Kristen N Ward, DO 09/10/13 1351 

## 2013-09-13 ENCOUNTER — Telehealth: Payer: Self-pay | Admitting: *Deleted

## 2013-09-13 NOTE — Telephone Encounter (Signed)
Nurse from [facility] Dept. Called stating patient's blood pressure was 140/98 on home visit today.  Pt denies any other symptoms, no edema noted.

## 2013-10-03 ENCOUNTER — Encounter: Payer: Self-pay | Admitting: Internal Medicine

## 2013-10-05 ENCOUNTER — Other Ambulatory Visit: Payer: Self-pay | Admitting: Oncology

## 2013-10-05 DIAGNOSIS — C9241 Acute promyelocytic leukemia, in remission: Secondary | ICD-10-CM

## 2013-10-06 ENCOUNTER — Telehealth: Payer: Self-pay | Admitting: Oncology

## 2013-10-06 NOTE — Telephone Encounter (Signed)
Talked to pt's fiance gave him appt for appt with Dr. Alvy Bimler on 1/28 lab and MD, a former Dr. Beryle Beams pt.

## 2013-10-12 ENCOUNTER — Encounter: Payer: Self-pay | Admitting: Obstetrics and Gynecology

## 2013-10-13 ENCOUNTER — Encounter: Payer: Self-pay | Admitting: Obstetrics & Gynecology

## 2013-10-13 ENCOUNTER — Ambulatory Visit (INDEPENDENT_AMBULATORY_CARE_PROVIDER_SITE_OTHER): Payer: Medicaid Other | Admitting: Medical

## 2013-10-13 VITALS — BP 136/88 | HR 86 | Temp 97.7°F | Ht 62.0 in | Wt 267.4 lb

## 2013-10-13 DIAGNOSIS — Z01812 Encounter for preprocedural laboratory examination: Secondary | ICD-10-CM

## 2013-10-13 DIAGNOSIS — Z30430 Encounter for insertion of intrauterine contraceptive device: Secondary | ICD-10-CM

## 2013-10-13 LAB — POCT URINALYSIS DIP (DEVICE)
Bilirubin Urine: NEGATIVE
GLUCOSE, UA: NEGATIVE mg/dL
Hgb urine dipstick: NEGATIVE
Ketones, ur: NEGATIVE mg/dL
NITRITE: NEGATIVE
Protein, ur: NEGATIVE mg/dL
Specific Gravity, Urine: 1.015 (ref 1.005–1.030)
Urobilinogen, UA: 0.2 mg/dL (ref 0.0–1.0)
pH: 5.5 (ref 5.0–8.0)

## 2013-10-13 LAB — POCT PREGNANCY, URINE: Preg Test, Ur: NEGATIVE

## 2013-10-13 NOTE — Progress Notes (Signed)
Patient ID: Melissa Washington, female   DOB: 03-21-1986, 28 y.o.   MRN: [id number]  Pt. Here today for postpartum check and IUD placement. Pt. Has had unprotected sex twice within the past week. UPT today negative. Instructed pt. To abstain from unprotected sex for two weeks and then she can return for another pregnancy test and IUD placement.

## 2013-10-13 NOTE — Progress Notes (Signed)
Patient ID: Melissa Washington, female   DOB: 1986-04-30, 28 y.o.   MRN: [id number]  Patient arrived for [facility] visit and Mirena insertion. Patient endorses unprotected intercourse within the last 2 weeks. UPT is negative today. Patient advised to abstain from intercourse or use condoms until return visit. Will have to return in 2 weeks for UPT. If negative can have Mirena insertion at that time. Patient will need PP visit at that time as well.   Farris Has, PA-C 10/13/2013 10:35 AM

## 2013-10-18 ENCOUNTER — Ambulatory Visit (HOSPITAL_BASED_OUTPATIENT_CLINIC_OR_DEPARTMENT_OTHER): Payer: Medicaid Other | Admitting: Hematology and Oncology

## 2013-10-18 ENCOUNTER — Encounter: Payer: Self-pay | Admitting: Hematology and Oncology

## 2013-10-18 ENCOUNTER — Other Ambulatory Visit (HOSPITAL_BASED_OUTPATIENT_CLINIC_OR_DEPARTMENT_OTHER): Payer: Medicaid Other

## 2013-10-18 VITALS — BP 142/88 | HR 77 | Temp 98.1°F | Resp 18 | Ht 62.0 in | Wt 270.9 lb

## 2013-10-18 DIAGNOSIS — E669 Obesity, unspecified: Secondary | ICD-10-CM

## 2013-10-18 DIAGNOSIS — C9241 Acute promyelocytic leukemia, in remission: Secondary | ICD-10-CM

## 2013-10-18 DIAGNOSIS — C9201 Acute myeloblastic leukemia, in remission: Secondary | ICD-10-CM

## 2013-10-18 DIAGNOSIS — E119 Type 2 diabetes mellitus without complications: Secondary | ICD-10-CM

## 2013-10-18 DIAGNOSIS — I10 Essential (primary) hypertension: Secondary | ICD-10-CM

## 2013-10-18 DIAGNOSIS — M549 Dorsalgia, unspecified: Secondary | ICD-10-CM

## 2013-10-18 DIAGNOSIS — G8929 Other chronic pain: Secondary | ICD-10-CM

## 2013-10-18 LAB — COMPREHENSIVE METABOLIC PANEL (CC13)
ALK PHOS: 135 U/L (ref 40–150)
ALT: 19 U/L (ref 0–55)
ANION GAP: 8 meq/L (ref 3–11)
AST: 13 U/L (ref 5–34)
Albumin: 3.9 g/dL (ref 3.5–5.0)
BILIRUBIN TOTAL: 0.93 mg/dL (ref 0.20–1.20)
BUN: 17 mg/dL (ref 7.0–26.0)
CO2: 27 meq/L (ref 22–29)
CREATININE: 0.9 mg/dL (ref 0.6–1.1)
Calcium: 9.3 mg/dL (ref 8.4–10.4)
Chloride: 108 mEq/L (ref 98–109)
Glucose: 152 mg/dl — ABNORMAL HIGH (ref 70–140)
Potassium: 4 mEq/L (ref 3.5–5.1)
Sodium: 142 mEq/L (ref 136–145)
Total Protein: 6.9 g/dL (ref 6.4–8.3)

## 2013-10-18 LAB — CBC WITH DIFFERENTIAL/PLATELET
BASO%: 0.5 % (ref 0.0–2.0)
Basophils Absolute: 0 10*3/uL (ref 0.0–0.1)
EOS ABS: 0.1 10*3/uL (ref 0.0–0.5)
EOS%: 2 % (ref 0.0–7.0)
HCT: 35.5 % (ref 34.8–46.6)
HEMOGLOBIN: 11.9 g/dL (ref 11.6–15.9)
LYMPH%: 31.7 % (ref 14.0–49.7)
MCH: 28.4 pg (ref 25.1–34.0)
MCHC: 33.4 g/dL (ref 31.5–36.0)
MCV: 85 fL (ref 79.5–101.0)
MONO#: 0.3 10*3/uL (ref 0.1–0.9)
MONO%: 5.2 % (ref 0.0–14.0)
NEUT%: 60.6 % (ref 38.4–76.8)
NEUTROS ABS: 3.8 10*3/uL (ref 1.5–6.5)
Platelets: 228 10*3/uL (ref 145–400)
RBC: 4.18 10*6/uL (ref 3.70–5.45)
RDW: 15.6 % — AB (ref 11.2–14.5)
WBC: 6.3 10*3/uL (ref 3.9–10.3)
lymph#: 2 10*3/uL (ref 0.9–3.3)

## 2013-10-18 LAB — MORPHOLOGY: PLT EST: ADEQUATE

## 2013-10-18 LAB — LACTATE DEHYDROGENASE (CC13): LDH: 161 U/L (ref 125–245)

## 2013-10-18 LAB — CHCC SMEAR

## 2013-10-18 NOTE — Progress Notes (Signed)
[place] OFFICE PROGRESS NOTE  Patient Care Team: Ma Hillock, DO as PCP - General  DIAGNOSIS: History of acute promyelocytic leukemia in remission  SUMMARY OF ONCOLOGIC HISTORY: This patient had history of promyelocytic leukemia, diagnosed in March of 2013. She was treated with combination therapy with ATRA and arsenic. Her last molecular studies at [facility] in April 2014 showed no evidence of residual disease. Her prior treatment was complicated by acute cholecystitis requiring cholecystectomy, history of DIC, pneumonia and Klebsiella bacteremia INTERVAL HISTORY: Melissa Washington 28 y.o. female returns for followup. She recently had delivery of her her second daughter who was born healthy. She is currently not breast-feeding her child. She complained of chronic back pain. The patient denies any recent signs or symptoms of bleeding such as spontaneous epistaxis, hematuria or hematochezia. She denies any recent fever, chills, night sweats or abnormal weight loss  I have reviewed the past medical history, past surgical history, social history and family history with the patient and they are unchanged from previous note.  ALLERGIES:  is allergic to ultram.  MEDICATIONS:  Current Outpatient Prescriptions  Medication Sig Dispense Refill  . ibuprofen (ADVIL,MOTRIN) 600 MG tablet Take 1 tablet (600 mg total) by mouth every 6 (six) hours.  30 tablet  0  . metFORMIN (GLUCOPHAGE) 1000 MG tablet Take 1 tablet (1,000 mg total) by mouth 2 (two) times daily with a meal.  90 tablet  2  . ondansetron (ZOFRAN) 4 MG tablet Take 1 tablet (4 mg total) by mouth every 8 (eight) hours as needed for nausea or vomiting.  10 tablet  0  . oxyCODONE-acetaminophen (PERCOCET) 5-325 MG per tablet Take 1 tablet by mouth every 4 (four) hours as needed.  10 tablet  0   No current facility-administered medications for this visit.    REVIEW OF SYSTEMS:   Constitutional: Denies  fevers, chills or abnormal weight loss Eyes: Denies blurriness of vision Ears, nose, mouth, throat, and face: Denies mucositis or sore throat Respiratory: Denies cough, dyspnea or wheezes Cardiovascular: Denies palpitation, chest discomfort or lower extremity swelling Gastrointestinal:  Denies nausea, heartburn or change in bowel habits Skin: Denies abnormal skin rashes Lymphatics: Denies new lymphadenopathy or easy bruising Neurological:Denies numbness, tingling or new weaknesses Behavioral/Psych: Mood is stable, no new changes  All other systems were reviewed with the patient and are negative.  PHYSICAL EXAMINATION: ECOG PERFORMANCE STATUS: 1 - Symptomatic but completely ambulatory  Filed Vitals:   10/18/13 1254  BP: 142/88  Pulse: 77  Temp: 98.1 F (36.7 C)  Resp: 18   Filed Weights   10/18/13 1254  Weight: 270 lb 14.4 oz (122.879 kg)    GENERAL:alert, no distress and comfortable. The patient is morbidly obese SKIN: skin color, texture, turgor are normal, no rashes or significant lesions EYES: normal, Conjunctiva are pink and non-injected, sclera clear OROPHARYNX:no exudate, no erythema and lips, buccal mucosa, and tongue normal  NECK: supple, thyroid normal size, non-tender, without nodularity LYMPH:  no palpable lymphadenopathy in the cervical, axillary or inguinal LUNGS: clear to auscultation and percussion with normal breathing effort HEART: regular rate & rhythm and no murmurs and no lower extremity edema ABDOMEN:abdomen soft, non-tender and normal bowel sounds Musculoskeletal:no cyanosis of digits and no clubbing  NEURO: alert & oriented x 3 with fluent speech, no focal motor/sensory deficits  LABORATORY DATA:  I have reviewed the data as listed    Component Value Date/Time   NA 139 09/08/2013 1408   NA  137 02/06/2013 1128   K 3.3* 09/08/2013 1408   K 4.0 02/06/2013 1128   CL 103 09/08/2013 1408   CL 104 02/06/2013 1128   CO2 25 09/08/2013 1408   CO2 23  02/06/2013 1128   GLUCOSE 129* 09/08/2013 1408   GLUCOSE 120* 02/06/2013 1128   BUN 8 09/08/2013 1408   BUN 7.8 02/06/2013 1128   CREATININE 0.95 09/08/2013 1408   CREATININE 0.63 06/05/2013 1154   CREATININE 0.63 06/05/2013 1144   CREATININE 0.7 02/06/2013 1128   CALCIUM 8.9 09/08/2013 1408   CALCIUM 9.1 02/06/2013 1128   PROT 6.6 09/08/2013 1408   PROT 7.0 02/06/2013 1128   ALBUMIN 2.9* 09/08/2013 1408   ALBUMIN 3.6 02/06/2013 1128   AST 21 09/08/2013 1408   AST 27 02/06/2013 1128   ALT 23 09/08/2013 1408   ALT 33 02/06/2013 1128   ALKPHOS 177* 09/08/2013 1408   ALKPHOS 77 02/06/2013 1128   BILITOT 0.9 09/08/2013 1408   BILITOT 0.52 02/06/2013 1128   GFRNONAA 83* 09/08/2013 1408   GFRAA >90 09/08/2013 1408    No results found for this basename: SPEP, UPEP,  kappa and lambda light chains    Lab Results  Component Value Date   WBC 6.3 10/18/2013   NEUTROABS 3.8 10/18/2013   HGB 11.9 10/18/2013   HCT 35.5 10/18/2013   MCV 85.0 10/18/2013   PLT 228 10/18/2013      Chemistry      Component Value Date/Time   NA 139 09/08/2013 1408   NA 137 02/06/2013 1128   K 3.3* 09/08/2013 1408   K 4.0 02/06/2013 1128   CL 103 09/08/2013 1408   CL 104 02/06/2013 1128   CO2 25 09/08/2013 1408   CO2 23 02/06/2013 1128   BUN 8 09/08/2013 1408   BUN 7.8 02/06/2013 1128   CREATININE 0.95 09/08/2013 1408   CREATININE 0.63 06/05/2013 1154   CREATININE 0.63 06/05/2013 1144   CREATININE 0.7 02/06/2013 1128   GLU 479* 03/01/2012 1210      Component Value Date/Time   CALCIUM 8.9 09/08/2013 1408   CALCIUM 9.1 02/06/2013 1128   ALKPHOS 177* 09/08/2013 1408   ALKPHOS 77 02/06/2013 1128   AST 21 09/08/2013 1408   AST 27 02/06/2013 1128   ALT 23 09/08/2013 1408   ALT 33 02/06/2013 1128   BILITOT 0.9 09/08/2013 1408   BILITOT 0.52 02/06/2013 1128     ASSESSMENT & PLAN:  #1 acute promyelocytic leukemia Clinically she has hematology remission. I will order molecular studies in the next visit in 6 months. I  recommend she continue followup with her hematologist at [facility]. #2 chronic back pain I recommend weight loss and consultative management with vitamin D and over-the-counter analgesics. I would not prescribe her with narcotic pain medications. #3 morbid obesity #4 diabetes The patient is currently on metformin. I would defer to her primary care provider for management #5 hypertension We'll monitor carefully. #6 preventive care I recommend vitamin D supplements.   Orders Placed This Encounter  Procedures  . CBC with Differential    Standing Status: Future     Number of Occurrences:      Standing Expiration Date: 10/18/2014  . Comprehensive metabolic panel    Standing Status: Future     Number of Occurrences:      Standing Expiration Date: 10/18/2014  . FISH, Peripheral Blood    PML-RARA PCR for APL    Standing Status: Future     Number  of Occurrences:      Standing Expiration Date: 10/18/2014   All questions were answered. The patient knows to call the clinic with any problems, questions or concerns. No barriers to learning was detected. I spent 25 minutes counseling the patient face to face. The total time spent in the appointment was 40 minutes and more than 50% was on counseling and review of test results     [facility], [place], MD 10/18/2013 1:20 PM

## 2013-10-19 ENCOUNTER — Telehealth: Payer: Self-pay | Admitting: Hematology and Oncology

## 2013-10-19 NOTE — Telephone Encounter (Signed)
s.w. pt and advised on July appt...mailed pt appt sched, avs and letter

## 2013-10-22 IMAGING — XA IR FLUORO GUIDE CV LINE*R*
1 series · 1 of 1 positions shown · non-contrast
Comparison: none

CLINICAL DATA: Leukemia.

[Series 1: care single · 1 of 1 slices shown]
[im 1/1]
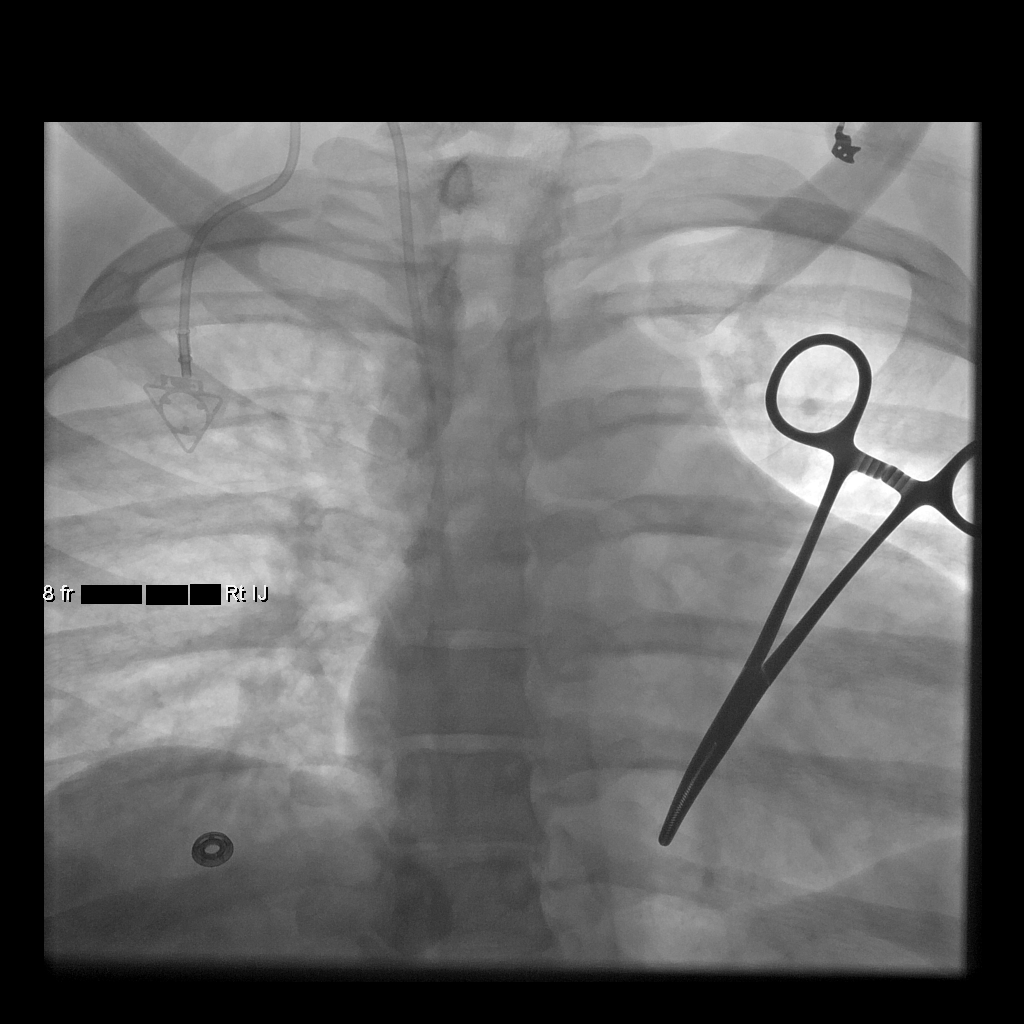

[1 of 1 positions shown; findings below may reference images not displayed]

FLUOROSCOPIC AND ULTRASOUND GUIDED PLACEMENT OF A SUBCUTANEOUS
PORT.

Medications:Versed 4 mg, Fentanyl 100 mcg. A radiology nurse
monitored the patient for moderate sedation.

As antibiotic prophylaxis, Ancef 2 gm was ordered pre-procedure and
administered intravenously within one hour of incision.

Moderate sedation time:60 minutes

Fluoroscopy time: 0.4 minutes

Procedure:  The risks of the procedure were explained to the
patient.  Informed consent was obtained.  Patient was placed supine
on the interventional table.  Ultrasound confirmed a patent right
internal jugular vein.  The right chest and neck were cleaned with
a skin antiseptic and a sterile drape was placed.  Maximal barrier
sterile technique was utilized including caps, mask, sterile gowns,
sterile gloves, sterile drape, hand hygiene and skin antiseptic.
The right neck was anesthetized with 1% lidocaine.  Small incision
was made in the right neck with a blade.  Micropuncture set was
placed in the right IJ with ultrasound guidance.  The micropuncture
wire was used for measurement purposes.  The right chest was
anesthetized with 1% lidocaine with epinephrine.  #15 blade was
used to make an incision and a subcutaneous port pocket was formed.
8 french Power Port was assembled.  Subcutaneous tunnel was formed
with a stiff tunneling device.  The port catheter was brought
through the subcutaneous tunnel.  The port was placed in the
subcutaneous pocket.  The micropuncture set was exchanged for a
peel-away sheath.  The catheter was placed through the peel-away
sheath and the tip was positioned in the lower SVC.  Catheter
placement was confirmed with fluoroscopy.  The port was accessed
and flushed with heparinized saline.  The port pocket was closed
using two layers of absorbable sutures and Dermabond.  The vein
skin site was closed using a single layer of absorbable suture and
Dermabond.  Sterile dressings were applied.  Patient tolerated the
procedure well without an immediate complication.  Ultrasound and
fluoroscopic images were taken and saved for this procedure.

Complications: None
IMPRESSION: Placement of a subcutaneous port device.  The catheter
tip is in the lower SVC and ready to be used.

## 2013-10-27 ENCOUNTER — Ambulatory Visit: Payer: Medicaid Other | Admitting: Obstetrics & Gynecology

## 2013-11-10 ENCOUNTER — Ambulatory Visit: Payer: Medicaid Other | Admitting: Nurse Practitioner

## 2013-11-13 ENCOUNTER — Telehealth: Payer: Self-pay

## 2013-11-13 ENCOUNTER — Ambulatory Visit: Payer: Medicaid Other | Admitting: Family Medicine

## 2013-11-13 NOTE — Telephone Encounter (Signed)
Pt did not show for appointment on 11/13/2013, called patient and left message to call and reschedule appointment/

## 2013-12-06 ENCOUNTER — Encounter: Payer: Self-pay | Admitting: *Deleted

## 2013-12-11 ENCOUNTER — Encounter: Payer: Self-pay | Admitting: Obstetrics & Gynecology

## 2013-12-11 ENCOUNTER — Ambulatory Visit (INDEPENDENT_AMBULATORY_CARE_PROVIDER_SITE_OTHER): Payer: Medicaid Other | Admitting: Obstetrics & Gynecology

## 2013-12-11 VITALS — BP 132/84 | HR 88 | Temp 97.1°F | Ht 62.0 in

## 2013-12-11 DIAGNOSIS — Z01812 Encounter for preprocedural laboratory examination: Secondary | ICD-10-CM

## 2013-12-11 DIAGNOSIS — Z30430 Encounter for insertion of intrauterine contraceptive device: Secondary | ICD-10-CM

## 2013-12-11 DIAGNOSIS — IMO0001 Reserved for inherently not codable concepts without codable children: Secondary | ICD-10-CM

## 2013-12-11 LAB — POCT PREGNANCY, URINE: Preg Test, Ur: NEGATIVE

## 2013-12-11 MED ORDER — LEVONORGESTREL 20 MCG/24HR IU IUD
INTRAUTERINE_SYSTEM | Freq: Once | INTRAUTERINE | Status: AC
  Administered 2013-12-11: 14:00:00 1 via INTRAUTERINE

## 2013-12-11 NOTE — Progress Notes (Signed)
Pt. Here today for PP and Mirena insertion. Pt. Denies having unprotected sexual intercourse in the last two weeks and states " I made sure of it." UPT negative.

## 2013-12-11 NOTE — Patient Instructions (Signed)
Levonorgestrel intrauterine device (IUD) What is this medicine? LEVONORGESTREL IUD (LEE voe nor jes trel) is a contraceptive (birth control) device. The device is placed inside the uterus by a healthcare professional. It is used to prevent pregnancy and can also be used to treat heavy bleeding that occurs during your period. Depending on the device, it can be used for 3 to 5 years. This medicine may be used for other purposes; ask your health care provider or pharmacist if you have questions. COMMON BRAND NAME(S): Mirena, Skyla What should I tell my health care provider before I take this medicine? They need to know if you have any of these conditions: -abnormal Pap smear -cancer of the breast, uterus, or cervix -diabetes -endometritis -genital or pelvic infection now or in the past -have more than one sexual partner or your partner has more than one partner -heart disease -history of an ectopic or tubal pregnancy -immune system problems -IUD in place -liver disease or tumor -problems with blood clots or take blood-thinners -use intravenous drugs -uterus of unusual shape -vaginal bleeding that has not been explained -an unusual or allergic reaction to levonorgestrel, other hormones, silicone, or polyethylene, medicines, foods, dyes, or preservatives -pregnant or trying to get pregnant -breast-feeding How should I use this medicine? This device is placed inside the uterus by a health care professional. Talk to your pediatrician regarding the use of this medicine in children. Special care may be needed. Overdosage: If you think you have taken too much of this medicine contact a poison control center or emergency room at once. NOTE: This medicine is only for you. Do not share this medicine with others. What if I miss a dose? This does not apply. What may interact with this medicine? Do not take this medicine with any of the following  medications: -amprenavir -bosentan -fosamprenavir This medicine may also interact with the following medications: -aprepitant -barbiturate medicines for inducing sleep or treating seizures -bexarotene -griseofulvin -medicines to treat seizures like carbamazepine, ethotoin, felbamate, oxcarbazepine, phenytoin, topiramate -modafinil -pioglitazone -rifabutin -rifampin -rifapentine -some medicines to treat HIV infection like atazanavir, indinavir, lopinavir, nelfinavir, tipranavir, ritonavir -St. John's wort -warfarin This list may not describe all possible interactions. Give your health care provider a list of all the medicines, herbs, non-prescription drugs, or dietary supplements you use. Also tell them if you smoke, drink alcohol, or use illegal drugs. Some items may interact with your medicine. What should I watch for while using this medicine? Visit your doctor or health care professional for regular check ups. See your doctor if you or your partner has sexual contact with others, becomes HIV positive, or gets a sexual transmitted disease. This product does not protect you against HIV infection (AIDS) or other sexually transmitted diseases. You can check the placement of the IUD yourself by reaching up to the top of your vagina with clean fingers to feel the threads. Do not pull on the threads. It is a good habit to check placement after each menstrual period. Call your doctor right away if you feel more of the IUD than just the threads or if you cannot feel the threads at all. The IUD may come out by itself. You may become pregnant if the device comes out. If you notice that the IUD has come out use a backup birth control method like condoms and call your health care provider. Using tampons will not change the position of the IUD and are okay to use during your period. What side effects may I   notice from receiving this medicine? Side effects that you should report to your doctor or  health care professional as soon as possible: -allergic reactions like skin rash, itching or hives, swelling of the face, lips, or tongue -fever, flu-like symptoms -genital sores -high blood pressure -no menstrual period for 6 weeks during use -pain, swelling, warmth in the leg -pelvic pain or tenderness -severe or sudden headache -signs of pregnancy -stomach cramping -sudden shortness of breath -trouble with balance, talking, or walking -unusual vaginal bleeding, discharge -yellowing of the eyes or skin Side effects that usually do not require medical attention (report to your doctor or health care professional if they continue or are bothersome): -acne -breast pain -change in sex drive or performance -changes in weight -cramping, dizziness, or faintness while the device is being inserted -headache -irregular menstrual bleeding within first 3 to 6 months of use -nausea This list may not describe all possible side effects. Call your doctor for medical advice about side effects. You may report side effects to FDA at 1-800-FDA-1088. Where should I keep my medicine? This does not apply. NOTE: This sheet is a summary. It may not cover all possible information. If you have questions about this medicine, talk to your doctor, pharmacist, or health care provider.  2014, [employer]vier/Gold Standard. (2011-10-08 13:54:04)  

## 2013-12-11 NOTE — Progress Notes (Signed)
Patient ID: Melissa Washington, female   DOB: 1985/10/17, 28 y.o.   MRN: [id number] Subjective:     Melissa Washington is a 28 y.o. female who presents for a postpartum visit. She is 6 weeks postpartum following a spontaneous vaginal delivery. I have fully reviewed the prenatal and intrapartum course. The delivery was at 64 gestational weeks. Outcome: spontaneous vaginal delivery. Postpartum course has been uncomlicated. Baby's course has been unremarkable. Baby is feeding by bottle. Bleeding no bleeding. Bowel function is normal. Bladder function is normal. Patient is sexually active. Contraception method is abstinence. (since last visit here). Postpartum depression screening: negative.  The following portions of the patient's history were reviewed and updated as appropriate: allergies, current medications, past family history, past medical history, past social history, past surgical history and problem list.  Review of Systems Pertinent items are noted in HPI.   Objective:    BP 132/84  Pulse 88  Temp(Src) 97.1 F (36.2 C) (Oral)  Ht 5\' 2"  (1.575 m)  LMP 12/03/2013  Breastfeeding? No  General:  alert and no distress           Abdomen: soft, non-tender; bowel sounds normal; no masses,  no organomegaly and obese   Vulva:  normal  Vagina: normal vagina  Cervix:  no cervical motion tenderness  Corpus: normal  Adnexa:  not evaluated  Rectal Exam: Not performed.       GYNECOLOGY CLINIC PROCEDURE NOTE Patient identified, informed consent performed.  Discussed risks of irregular bleeding, cramping, infection, malpositioning or misplacement of the IUD outside the uterus which may require further procedures. Time out was performed.  Urine pregnancy test negative.  Speculum placed in the vagina.  Cervix visualized.  Cleaned with Betadine x 2.  Grasped anteriorly with a single tooth tenaculum.  Uterus sounded to 7 cm.  Mirena IUD placed per manufacturer's recommendations.  Strings trimmed to 3 cm.  Tenaculum was removed, good hemostasis noted.  Patient tolerated procedure well.     Assessment:     postpartum exam. Pap smear not done at today's visit.   Plan:    1. Contraception: IUD 2. Follow up in: 8 weeks or as needed. f/u 1 year for annual

## 2014-01-11 ENCOUNTER — Emergency Department (INDEPENDENT_AMBULATORY_CARE_PROVIDER_SITE_OTHER)
Admission: EM | Admit: 2014-01-11 | Discharge: 2014-01-11 | Disposition: A | Discharge status: Home / Self Care | Payer: Medicaid Other | Source: Home / Self Care | Attending: Family Medicine | Admitting: Family Medicine

## 2014-01-11 ENCOUNTER — Encounter (HOSPITAL_COMMUNITY): Payer: Self-pay | Admitting: Emergency Medicine

## 2014-01-11 DIAGNOSIS — H109 Unspecified conjunctivitis: Secondary | ICD-10-CM

## 2014-01-11 MED ORDER — POLYMYXIN B-TRIMETHOPRIM 10000-0.1 UNIT/ML-% OP SOLN: 1.0000 [drp] | OPHTHALMIC | Status: DC

## 2014-01-11 NOTE — ED Notes (Signed)
Infant has pink eye; c/o sore throat

## 2014-01-11 NOTE — Discharge Instructions (Signed)
Bacterial Conjunctivitis  Bacterial conjunctivitis, commonly called pink eye, is an inflammation of the clear membrane that covers the white part of the eye (conjunctiva). The inflammation can also happen on the underside of the eyelids. The blood vessels in the conjunctiva become inflamed causing the eye to become red or pink. Bacterial conjunctivitis may spread easily from one eye to another and from person to person (contagious).   CAUSES   Bacterial conjunctivitis is caused by bacteria. The bacteria may come from your own skin, your upper respiratory tract, or from someone else with bacterial conjunctivitis.  SYMPTOMS   The normally white color of the eye or the underside of the eyelid is usually pink or red. The pink eye is usually associated with irritation, tearing, and some sensitivity to light. Bacterial conjunctivitis is often associated with a thick, yellowish discharge from the eye. The discharge may turn into a crust on the eyelids overnight, which causes your eyelids to stick together. If a discharge is present, there may also be some blurred vision in the affected eye.  DIAGNOSIS   Bacterial conjunctivitis is diagnosed by your caregiver through an eye exam and the symptoms that you report. Your caregiver looks for changes in the surface tissues of your eyes, which may point to the specific type of conjunctivitis. A sample of any discharge may be collected on a cotton-tip swab if you have a severe case of conjunctivitis, if your cornea is affected, or if you keep getting repeat infections that do not respond to treatment. The sample will be sent to a lab to see if the inflammation is caused by a bacterial infection and to see if the infection will respond to antibiotic medicines.  TREATMENT   · Bacterial conjunctivitis is treated with antibiotics. Antibiotic eyedrops are most often used. However, antibiotic ointments are also available. Antibiotics pills are sometimes used. Artificial tears or eye  washes may ease discomfort.  HOME CARE INSTRUCTIONS   · To ease discomfort, apply a cool, clean wash cloth to your eye for 10 20 minutes, 3 4 times a day.  · Gently wipe away any drainage from your eye with a warm, wet washcloth or a cotton ball.  · Wash your hands often with soap and water. Use paper towels to dry your hands.  · Do not share towels or wash cloths. This may spread the infection.  · Change or wash your pillow case every day.  · You should not use eye makeup until the infection is gone.  · Do not operate machinery or drive if your vision is blurred.  · Stop using contacts lenses. Ask your caregiver how to sterilize or replace your contacts before using them again. This depends on the type of contact lenses that you use.  · When applying medicine to the infected eye, do not touch the edge of your eyelid with the eyedrop bottle or ointment tube.  SEEK IMMEDIATE MEDICAL CARE IF:   · Your infection has not improved within 3 days after beginning treatment.  · You had yellow discharge from your eye and it returns.  · You have increased eye pain.  · Your eye redness is spreading.  · Your vision becomes blurred.  · You have a fever or persistent symptoms for more than 2 3 days.  · You have a fever and your symptoms suddenly get worse.  · You have facial pain, redness, or swelling.  MAKE SURE YOU:   · Understand these instructions.  · Will watch your   condition.  · Will get help right away if you are not doing well or get worse.  Document Released: 09/07/2005 Document Revised: 06/01/2012 Document Reviewed: 02/08/2012  [employer]® Patient Information ©2014 [employer].

## 2014-01-11 NOTE — ED Provider Notes (Signed)
CSN: [id number]     Arrival date & time 01/11/14  1748 History   First MD Initiated Contact with Patient 01/11/14 1845     Chief Complaint  Patient presents with  . Conjunctivitis   (Consider location/radiation/quality/duration/timing/severity/associated sxs/prior Treatment) HPI Comments: 28 year old female presents complaining of eye itching, pain, irritation, purulent discharge, crusting, and sore throat. The eye symptoms began 3 days ago and have gotten progressively worse. She has also developed a slight sore throat, she says this is very mild. This started in her right eye and subsequently spread to the left. She has brought in her 91 month old infant who has also developed a similar eye condition today. She denies fever, chills, or any other systemic symptoms.  Patient is a 28 y.o. female presenting with conjunctivitis.  Conjunctivitis    Past Medical History  Diagnosis Date  . Hypertension   . Nephrolithiasis   . Hepatic steatosis 01/17/2012  . Cholelithiasis 01/17/2012  . Headache(784.0)   . Bacteremia associated with IV line 05/01/2013    Port-a-cath infection, removed 8/6 by IR. Ecoli grew out in cultures. Sent home with Ceftriaxone to complete 14 day course.   . Bacteremia due to Escherichia coli 05/01/2013    Port-a-cath infection, removed 8/6 by IR. Ecoli grew out in cultures. Sent home with Ceftriaxone to complete 14 day course.   . 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 Leukemia, acute monocytic, in remission   . Diabetes mellitus     type 2, insulin   Past Surgical History  Procedure Laterality Date  . Portacath placement      [facility]  . Cholecystectomy  01/31/2012    Procedure: LAPAROSCOPIC CHOLECYSTECTOMY WITH INTRAOPERATIVE CHOLANGIOGRAM;  Surgeon: Joyice Faster. Cornett, MD;  Location: [facility];  Service: General;  Laterality: N/A;   Family History  Problem Relation Age of Onset  . Kidney disease Mother   . Hypertension Mother   . Epilepsy Mother   .  Sleep apnea Mother   . Kidney disease Maternal Grandmother   . Diabetes Maternal Grandmother   . Diabetes Father    History  Substance Use Topics  . Smoking status: Never Smoker   . Smokeless tobacco: Never Used  . Alcohol Use: No   OB History   Grav Para Term Preterm Abortions TAB SAB Ect Mult Living   2 2 2  0 0 0 0 0 0 2     Review of Systems  HENT: Positive for sore throat.   Eyes: Positive for pain, discharge, redness and itching. Negative for photophobia and visual disturbance.  All other systems reviewed and are negative.   Allergies  Ultram  Home Medications   Prior to Admission medications   Medication Sig Start Date End Date Taking? Authorizing Provider  metFORMIN (GLUCOPHAGE) 1000 MG tablet Take 1 tablet (1,000 mg total) by mouth 2 (two) times daily with a meal. 09/03/13  Yes Allen Norris, MD  oxyCODONE-acetaminophen (PERCOCET) 5-325 MG per tablet Take 1 tablet by mouth every 4 (four) hours as needed. 09/08/13  Yes Margarita Mail, PA-C  ibuprofen (ADVIL,MOTRIN) 600 MG tablet Take 1 tablet (600 mg total) by mouth every 6 (six) hours. 09/03/13   Allen Norris, MD  trimethoprim-polymyxin b (POLYTRIM) ophthalmic solution Place 1 drop into both eyes every 3 (three) hours. While awake, up to 6 doses per day 01/11/14   Liam Graham, PA-C   BP 143/86  Pulse 79  Temp(Src) 98 F (36.7 C) (Oral)  SpO2 97%  LMP 12/21/2013 Physical Exam  Nursing note and vitals reviewed. Constitutional: She is oriented to person, place, and time. Vital signs are normal. She appears well-developed and well-nourished. No distress.  HENT:  Head: Normocephalic and atraumatic.  Eyes: EOM are normal. Pupils are equal, round, and reactive to light. Right eye exhibits discharge. Left eye exhibits discharge. Right conjunctiva is injected. Left conjunctiva is injected.  Pulmonary/Chest: Effort normal. No respiratory distress.  Lymphadenopathy:    She has no cervical adenopathy.  Neurological:  She is alert and oriented to person, place, and time. She has normal strength. Coordination normal.  Skin: Skin is warm and dry. No rash noted. She is not diaphoretic.  Psychiatric: She has a normal mood and affect. Judgment normal.    ED Course  Procedures (including critical care time) Labs Review Labs Reviewed - No data to display  Results for orders placed in visit on 12/11/13  POCT PREGNANCY, URINE      Result Value Ref Range   Preg Test, Ur NEGATIVE  NEGATIVE   Imaging Review No results found.   MDM   1. Conjunctivitis    Exam is consistent with bacterial conjunctivitis. Treating with Polytrim eyedrops. Followup if not improving. Treating her infant as well who is here checked in as a patient.   Meds ordered this encounter  Medications  . trimethoprim-polymyxin b (POLYTRIM) ophthalmic solution    Sig: Place 1 drop into both eyes every 3 (three) hours. While awake, up to 6 doses per day    Dispense:  10 mL    Refill:  0    Order Specific Question:  Supervising Provider    Answer:  Lynne Leader, Doctor [facility]       Liam Graham, PA-C 01/11/14 2115

## 2014-01-11 NOTE — ED Provider Notes (Signed)
Medical screening examination/treatment/procedure(s) were performed by a resident physician or non-physician practitioner and as the supervising physician I was immediately available for consultation/collaboration.  Lynne Leader, MD    Gregor Hams, MD 01/11/14 2123

## 2014-01-15 ENCOUNTER — Encounter: Payer: Self-pay | Admitting: *Deleted

## 2014-01-24 ENCOUNTER — Emergency Department (HOSPITAL_COMMUNITY)
Admission: EM | Admit: 2014-01-24 | Discharge: 2014-01-24 | Disposition: A | Discharge status: Home / Self Care | Payer: Medicaid Other | Source: Home / Self Care | Attending: Family Medicine | Admitting: Family Medicine

## 2014-01-24 ENCOUNTER — Encounter (HOSPITAL_COMMUNITY): Payer: Self-pay | Admitting: Emergency Medicine

## 2014-01-24 DIAGNOSIS — T148XXA Other injury of unspecified body region, initial encounter: Secondary | ICD-10-CM

## 2014-01-24 DIAGNOSIS — X58XXXA Exposure to other specified factors, initial encounter: Secondary | ICD-10-CM

## 2014-01-24 LAB — CBC WITH DIFFERENTIAL/PLATELET
Basophils Absolute: 0 10*3/uL (ref 0.0–0.1)
Basophils Relative: 0 % (ref 0–1)
EOS PCT: 3 % (ref 0–5)
Eosinophils Absolute: 0.2 10*3/uL (ref 0.0–0.7)
HCT: 34.9 % — ABNORMAL LOW (ref 36.0–46.0)
Hemoglobin: 12.1 g/dL (ref 12.0–15.0)
LYMPHS ABS: 2.8 10*3/uL (ref 0.7–4.0)
Lymphocytes Relative: 36 % (ref 12–46)
MCH: 29.4 pg (ref 26.0–34.0)
MCHC: 34.7 g/dL (ref 30.0–36.0)
MCV: 84.7 fL (ref 78.0–100.0)
Monocytes Absolute: 0.4 10*3/uL (ref 0.1–1.0)
Monocytes Relative: 5 % (ref 3–12)
Neutro Abs: 4.4 10*3/uL (ref 1.7–7.7)
Neutrophils Relative %: 56 % (ref 43–77)
PLATELETS: 247 10*3/uL (ref 150–400)
RBC: 4.12 MIL/uL (ref 3.87–5.11)
RDW: 13 % (ref 11.5–15.5)
WBC: 7.8 10*3/uL (ref 4.0–10.5)

## 2014-01-24 LAB — POCT I-STAT, CHEM 8
BUN: 13 mg/dL (ref 6–23)
CHLORIDE: 102 meq/L (ref 96–112)
Calcium, Ion: 1.27 mmol/L — ABNORMAL HIGH (ref 1.12–1.23)
Creatinine, Ser: 0.9 mg/dL (ref 0.50–1.10)
GLUCOSE: 222 mg/dL — AB (ref 70–99)
HEMATOCRIT: 40 % (ref 36.0–46.0)
Hemoglobin: 13.6 g/dL (ref 12.0–15.0)
Potassium: 3.9 mEq/L (ref 3.7–5.3)
Sodium: 140 mEq/L (ref 137–147)
TCO2: 22 mmol/L (ref 0–100)

## 2014-01-24 LAB — PROTIME-INR
INR: 0.99 (ref 0.00–1.49)
PROTHROMBIN TIME: 12.9 s (ref 11.6–15.2)

## 2014-01-24 NOTE — ED Notes (Signed)
Noticed bruise to left abdomen, no known injury

## 2014-01-24 NOTE — Discharge Instructions (Signed)
Thank you for coming in today. Come back as needed.   Hematoma A hematoma is a collection of blood under the skin, in an organ, in a body space, in a joint space, or in other tissue. The blood can clot to form a lump that you can see and feel. The lump is often firm and may sometimes become sore and tender. Most hematomas get better in a few days to weeks. However, some hematomas may be serious and require medical care. Hematomas can range in size from very small to very large. CAUSES  A hematoma can be caused by a blunt or penetrating injury. It can also be caused by spontaneous leakage from a blood vessel under the skin. Spontaneous leakage from a blood vessel is more likely to occur in older people, especially those taking blood thinners. Sometimes, a hematoma can develop after certain medical procedures. SIGNS AND SYMPTOMS   A firm lump on the body.  Possible pain and tenderness in the area.  Bruising.Blue, dark blue, purple-red, or yellowish skin may appear at the site of the hematoma if the hematoma is close to the surface of the skin. For hematomas in deeper tissues or body spaces, the signs and symptoms may be subtle. For example, an intra-abdominal hematoma may cause abdominal pain, weakness, fainting, and shortness of breath. An intracranial hematoma may cause a headache or symptoms such as weakness, trouble speaking, or a change in consciousness. DIAGNOSIS  A hematoma can usually be diagnosed based on your medical history and a physical exam. Imaging tests may be needed if your health care provider suspects a hematoma in deeper tissues or body spaces, such as the abdomen, head, or chest. These tests may include ultrasonography or a CT scan.  TREATMENT  Hematomas usually go away on their own over time. Rarely does the blood need to be drained out of the body. Large hematomas or those that may affect vital organs will sometimes need surgical drainage or monitoring. HOME CARE INSTRUCTIONS    Apply ice to the injured area:   Put ice in a plastic bag.   Place a towel between your skin and the bag.   Leave the ice on for 20 minutes, 2 3 times a day for the first 1 to 2 days.   After the first 2 days, switch to using warm compresses on the hematoma.   Elevate the injured area to help decrease pain and swelling. Wrapping the area with an elastic bandage may also be helpful. Compression helps to reduce swelling and promotes shrinking of the hematoma. Make sure the bandage is not wrapped too tight.   If your hematoma is on a lower extremity and is painful, crutches may be helpful for a couple days.   Only take over-the-counter or prescription medicines as directed by your health care provider. SEEK IMMEDIATE MEDICAL CARE IF:   You have increasing pain, or your pain is not controlled with medicine.   You have a fever.   You have worsening swelling or discoloration.   Your skin over the hematoma breaks or starts bleeding.   Your hematoma is in your chest or abdomen and you have weakness, shortness of breath, or a change in consciousness.  Your hematoma is on your scalp (caused by a fall or injury) and you have a worsening headache or a change in alertness or consciousness. MAKE SURE YOU:   Understand these instructions.  Will watch your condition.  Will get help right away if you are not doing  well or get worse. Document Released: 04/21/2004 Document Revised: 05/10/2013 Document Reviewed: 02/15/2013 [facility] Patient Information 2014 [place].

## 2014-01-24 NOTE — ED Provider Notes (Signed)
Melissa Washington is a 28 y.o. female who presents to Urgent Care today for left abdomen bruise. Present for the last few days. No pain fevers chills nausea vomiting or diarrhea. No known injury. Patient has a history of acute promyelocytic leukemia in remission as of August 2013. No blood in the stool or bleeding gums.   Past Medical History  Diagnosis Date  . Hypertension   . Nephrolithiasis   . Hepatic steatosis 01/17/2012  . Cholelithiasis 01/17/2012  . Headache(784.0)   . Bacteremia associated with IV line 05/01/2013    Port-a-cath infection, removed 8/6 by IR. Ecoli grew out in cultures. Sent home with Ceftriaxone to complete 14 day course.   . Bacteremia due to Escherichia coli 05/01/2013    Port-a-cath infection, removed 8/6 by IR. Ecoli grew out in cultures. Sent home with Ceftriaxone to complete 14 day course.   . 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 Leukemia, acute monocytic, in remission   . Diabetes mellitus     type 2, insulin   History  Substance Use Topics  . Smoking status: Never Smoker   . Smokeless tobacco: Never Used  . Alcohol Use: No   ROS as above Medications: No current facility-administered medications for this encounter.   Current Outpatient Prescriptions  Medication Sig Dispense Refill  . metFORMIN (GLUCOPHAGE) 1000 MG tablet Take 1 tablet (1,000 mg total) by mouth 2 (two) times daily with a meal.  90 tablet  2  . ibuprofen (ADVIL,MOTRIN) 600 MG tablet Take 1 tablet (600 mg total) by mouth every 6 (six) hours.  30 tablet  0  . oxyCODONE-acetaminophen (PERCOCET) 5-325 MG per tablet Take 1 tablet by mouth every 4 (four) hours as needed.  10 tablet  0  . trimethoprim-polymyxin b (POLYTRIM) ophthalmic solution Place 1 drop into both eyes every 3 (three) hours. While awake, up to 6 doses per day  10 mL  0    Exam:  BP 164/102  Pulse 85  Temp(Src) 98.1 F (36.7 C) (Oral)  Resp 17  SpO2 97%  LMP 12/21/2013 Gen: Well NAD, obese HEENT:  EOMI,  MMM Lungs: Normal work of breathing. CTABL Heart: RRR no MRG Abd: NABS, Soft. NT, ND Exts: Brisk capillary refill, warm and well perfused.  Skin: 6 cm ecchymosis left abdomen skin. Mildly tender to touch  Results for orders placed during the hospital encounter of 01/24/14 (from the past 24 hour(s))  CBC WITH DIFFERENTIAL     Status: Abnormal   Collection Time    01/24/14  1:15 PM      Result Value Ref Range   WBC 7.8  4.0 - 10.5 K/uL   RBC 4.12  3.87 - 5.11 MIL/uL   Hemoglobin 12.1  12.0 - 15.0 g/dL   HCT 34.9 (*) 36.0 - 46.0 %   MCV 84.7  78.0 - 100.0 fL   MCH 29.4  26.0 - 34.0 pg   MCHC 34.7  30.0 - 36.0 g/dL   RDW 13.0  11.5 - 15.5 %   Platelets 247  150 - 400 K/uL   Neutrophils Relative % 56  43 - 77 %   Neutro Abs 4.4  1.7 - 7.7 K/uL   Lymphocytes Relative 36  12 - 46 %   Lymphs Abs 2.8  0.7 - 4.0 K/uL   Monocytes Relative 5  3 - 12 %   Monocytes Absolute 0.4  0.1 - 1.0 K/uL   Eosinophils Relative 3  0 -  5 %   Eosinophils Absolute 0.2  0.0 - 0.7 K/uL   Basophils Relative 0  0 - 1 %   Basophils Absolute 0.0  0.0 - 0.1 K/uL  PROTIME-INR     Status: None   Collection Time    01/24/14  1:15 PM      Result Value Ref Range   Prothrombin Time 12.9  11.6 - 15.2 seconds   INR 0.99  0.00 - 1.49  POCT I-STAT, CHEM 8     Status: Abnormal   Collection Time    01/24/14  1:40 PM      Result Value Ref Range   Sodium 140  137 - 147 mEq/L   Potassium 3.9  3.7 - 5.3 mEq/L   Chloride 102  96 - 112 mEq/L   BUN 13  6 - 23 mg/dL   Creatinine, Ser 0.90  0.50 - 1.10 mg/dL   Glucose, Bld 222 (*) 70 - 99 mg/dL   Calcium, Ion 1.27 (*) 1.12 - 1.23 mmol/L   TCO2 22  0 - 100 mmol/L   Hemoglobin 13.6  12.0 - 15.0 g/dL   HCT 40.0  36.0 - 46.0 %   No results found.  Assessment and Plan: 29 y.o. female with bruising. Patient's exam and recent history is benign. Her history leukemia is concerning. No evidence of recurrence of leukemia on today's labs. Recommend patient follow up with  primary care provider for blood pressure and diabetes.  Discussed warning signs or symptoms. Please see discharge instructions. Patient expresses understanding.    Gregor Hams, MD 01/24/14 [phone]

## 2014-01-29 ENCOUNTER — Emergency Department (HOSPITAL_COMMUNITY)
Admission: EM | Admit: 2014-01-29 | Discharge: 2014-01-29 | Disposition: A | Discharge status: Home / Self Care | Payer: Medicaid Other | Attending: Emergency Medicine | Admitting: Emergency Medicine

## 2014-01-29 ENCOUNTER — Encounter (HOSPITAL_COMMUNITY): Payer: Self-pay | Admitting: Emergency Medicine

## 2014-01-29 DIAGNOSIS — E119 Type 2 diabetes mellitus without complications: Secondary | ICD-10-CM | POA: Insufficient documentation

## 2014-01-29 DIAGNOSIS — Z87442 Personal history of urinary calculi: Secondary | ICD-10-CM | POA: Insufficient documentation

## 2014-01-29 DIAGNOSIS — Z79899 Other long term (current) drug therapy: Secondary | ICD-10-CM | POA: Insufficient documentation

## 2014-01-29 DIAGNOSIS — Z8619 Personal history of other infectious and parasitic diseases: Secondary | ICD-10-CM | POA: Insufficient documentation

## 2014-01-29 DIAGNOSIS — I10 Essential (primary) hypertension: Secondary | ICD-10-CM | POA: Insufficient documentation

## 2014-01-29 DIAGNOSIS — Z8719 Personal history of other diseases of the digestive system: Secondary | ICD-10-CM | POA: Insufficient documentation

## 2014-01-29 DIAGNOSIS — Z791 Long term (current) use of non-steroidal anti-inflammatories (NSAID): Secondary | ICD-10-CM | POA: Insufficient documentation

## 2014-01-29 DIAGNOSIS — Z856 Personal history of leukemia: Secondary | ICD-10-CM | POA: Insufficient documentation

## 2014-01-29 DIAGNOSIS — R109 Unspecified abdominal pain: Secondary | ICD-10-CM

## 2014-01-29 DIAGNOSIS — R1012 Left upper quadrant pain: Secondary | ICD-10-CM | POA: Insufficient documentation

## 2014-01-29 LAB — CBC WITH DIFFERENTIAL/PLATELET
Basophils Absolute: 0 10*3/uL (ref 0.0–0.1)
Basophils Relative: 0 % (ref 0–1)
Eosinophils Absolute: 0.2 10*3/uL (ref 0.0–0.7)
Eosinophils Relative: 2 % (ref 0–5)
HEMATOCRIT: 34.5 % — AB (ref 36.0–46.0)
HEMOGLOBIN: 11.9 g/dL — AB (ref 12.0–15.0)
LYMPHS PCT: 36 % (ref 12–46)
Lymphs Abs: 2.7 10*3/uL (ref 0.7–4.0)
MCH: 29.9 pg (ref 26.0–34.0)
MCHC: 34.5 g/dL (ref 30.0–36.0)
MCV: 86.7 fL (ref 78.0–100.0)
MONO ABS: 0.3 10*3/uL (ref 0.1–1.0)
MONOS PCT: 4 % (ref 3–12)
NEUTROS ABS: 4.3 10*3/uL (ref 1.7–7.7)
Neutrophils Relative %: 58 % (ref 43–77)
Platelets: 229 10*3/uL (ref 150–400)
RBC: 3.98 MIL/uL (ref 3.87–5.11)
RDW: 13 % (ref 11.5–15.5)
WBC: 7.5 10*3/uL (ref 4.0–10.5)

## 2014-01-29 MED ORDER — HYDROCODONE-ACETAMINOPHEN 5-325 MG PO TABS: 1.0000 | ORAL_TABLET | Freq: Four times a day (QID) | ORAL | Status: DC | PRN

## 2014-01-29 NOTE — ED Notes (Signed)
Pt reports being seen 4 days ago for bruise on left upper abdomen. Pt states today "the bruise has gone away, but now I feel a lump." Denies N/V/D. Denies any abdominal pain. NAD. Hx of leukemia, but lab work 4 days ago WDL. AO x4. NAD.

## 2014-01-29 NOTE — ED Provider Notes (Signed)
CSN: [id number]     Arrival date & time 01/29/14  1920 History  This chart was scribed for non-physician practitioner Etta Quill, NP working with Carmin Muskrat, MD by Zettie Pho, ED Scribe. This patient was seen in room TR10C/TR10C and the patient's care was started at 9:05 PM.    Chief Complaint  Patient presents with  . Mass   Patient is a 28 y.o. female presenting with abdominal pain. The history is provided by the patient. No language interpreter was used.  Abdominal Pain Pain location:  LUQ Pain severity:  Mild Onset quality:  Gradual Timing:  Constant Progression:  Worsening Chronicity:  New Context: previous surgery   Relieved by:  None tried Worsened by:  Nothing tried Ineffective treatments:  None tried Associated symptoms: no fever, no nausea, no sore throat and no vomiting   Risk factors: obesity    HPI Comments: Melissa Washington is a 28 y.o. Female with a history of hepatic steatosis who presents to the Emergency Department complaining of a painful mass to the LUQ of the abdomen onset about 3 days ago that she states has been progressively worsening. Patient was evaluated 4 days ago for some bruising in the area, which she states has since resolved, but that the mass has been getting progressively larger and more painful. She denies any potential injury or trauma to the area. She denies fever, nausea, vomiting, sore throat, ear pain, blood in the stool. Patient has a history of cholecystectomy. Patient has an allergy to Ultram. Patient also has a history of HTN, type II DM, and acute promyelocytic leukemia (in remission since 04/24/2012).   Past Medical History  Diagnosis Date  . Hypertension   . Nephrolithiasis   . Hepatic steatosis 01/17/2012  . Cholelithiasis 01/17/2012  . Headache(784.0)   . Bacteremia associated with IV line 05/01/2013    Port-a-cath infection, removed 8/6 by IR. Ecoli grew out in cultures. Sent home with Ceftriaxone to complete 14 day course.   .  Bacteremia due to Escherichia coli 05/01/2013    Port-a-cath infection, removed 8/6 by IR. Ecoli grew out in cultures. Sent home with Ceftriaxone to complete 14 day course.   . 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 Leukemia, acute monocytic, in remission   . Diabetes mellitus     type 2, insulin   Past Surgical History  Procedure Laterality Date  . Portacath placement      [facility]  . Cholecystectomy  01/31/2012    Procedure: LAPAROSCOPIC CHOLECYSTECTOMY WITH INTRAOPERATIVE CHOLANGIOGRAM;  Surgeon: Joyice Faster. Cornett, MD;  Location: [facility];  Service: General;  Laterality: N/A;   Family History  Problem Relation Age of Onset  . Kidney disease Mother   . Hypertension Mother   . Epilepsy Mother   . Sleep apnea Mother   . Kidney disease Maternal Grandmother   . Diabetes Maternal Grandmother   . Diabetes Father    History  Substance Use Topics  . Smoking status: Never Smoker   . Smokeless tobacco: Never Used  . Alcohol Use: No   OB History   Grav Para Term Preterm Abortions TAB SAB Ect Mult Living   2 2 2  0 0 0 0 0 0 2     Review of Systems  Constitutional: Negative for fever.  HENT: Negative for ear pain and sore throat.   Gastrointestinal: Positive for abdominal pain. Negative for nausea, vomiting and blood in stool.  All other systems reviewed and are negative.  Allergies  Ultram  Home Medications   Prior to Admission medications   Medication Sig Start Date End Date Taking? Authorizing Provider  ibuprofen (ADVIL,MOTRIN) 600 MG tablet Take 1 tablet (600 mg total) by mouth every 6 (six) hours. 09/03/13   Allen Norris, MD  metFORMIN (GLUCOPHAGE) 1000 MG tablet Take 1 tablet (1,000 mg total) by mouth 2 (two) times daily with a meal. 09/03/13   Allen Norris, MD  oxyCODONE-acetaminophen (PERCOCET) 5-325 MG per tablet Take 1 tablet by mouth every 4 (four) hours as needed. 09/08/13   Margarita Mail, PA-C  trimethoprim-polymyxin b (POLYTRIM) ophthalmic  solution Place 1 drop into both eyes every 3 (three) hours. While awake, up to 6 doses per day 01/11/14   Liam Graham, PA-C   Triage Vitals: BP 134/90  Pulse 110  Temp(Src) 98 F (36.7 C) (Oral)  Resp 16  SpO2 100%  LMP 12/21/2013  Physical Exam  Nursing note and vitals reviewed. Constitutional: She is oriented to person, place, and time. She appears well-developed and well-nourished. No distress.  HENT:  Head: Normocephalic and atraumatic.  Right Ear: Hearing, tympanic membrane, external ear and ear canal normal.  Left Ear: Hearing, tympanic membrane, external ear and ear canal normal.  Mouth/Throat: Oropharynx is clear and moist. No oropharyngeal exudate.  Eyes: Conjunctivae are normal.  Neck: Normal range of motion. Neck supple.  Cardiovascular: Normal rate, regular rhythm and normal heart sounds.   Pulmonary/Chest: Effort normal and breath sounds normal. No respiratory distress.  Abdominal: She exhibits mass. She exhibits no distension. There is tenderness.  0.75 cm nodule in the LUQ that is tender to palpation. No external bruising noted. No increased warmth.   Musculoskeletal: Normal range of motion.  Lymphadenopathy:    She has no cervical adenopathy.  Neurological: She is alert and oriented to person, place, and time.  Skin: Skin is warm and dry.  Psychiatric: She has a normal mood and affect. Her behavior is normal.    ED Course  Procedures (including critical care time)  DIAGNOSTIC STUDIES: Oxygen Saturation is 100% on room air, normal by my interpretation.    COORDINATION OF CARE: 9:09 PM- Will consult with Dr. Vanita Panda. Discussed treatment plan with patient at bedside and patient verbalized agreement.   9:18 PM- Will order CBC. Discussed treatment plan with patient at bedside and patient verbalized agreement.   10:25 PM- Discussed that lab results were relatively normal. Advised patient to follow up with her oncologist. Discussed treatment plan with patient  at bedside and patient verbalized agreement.   Labs Review Labs Reviewed  CBC WITH DIFFERENTIAL - Abnormal; Notable for the following:    Hemoglobin 11.9 (*)    HCT 34.5 (*)    All other components within normal limits    Imaging Review No results found.   EKG Interpretation None     Return precautions discussed with patient. MDM   Final diagnoses:  None    Abdominal pain.  I personally performed the services described in this documentation, which was scribed in my presence. The recorded information has been reviewed and is accurate.     Norman Herrlich, NP 01/29/14 2229

## 2014-01-29 NOTE — ED Notes (Signed)
Pt c/o upper left abd mass. Pt reports bruise appeared around 4 days ago and a small knot, pt states the bruising went away but the knot has gotten bigger and more painful. Pt denies any injury to the area. Rates pain 8/10. Denies any n/v/d. Pt reports she does not been having any diarrhea but her bowels have not been the same. Pt reports worsening stomach pain after eating.

## 2014-01-29 NOTE — ED Notes (Signed)
Discharge instructions reviewed with pt. Pt verbalized understanding.   

## 2014-01-30 NOTE — ED Provider Notes (Signed)
  Medical screening examination/treatment/procedure(s) were performed by non-physician practitioner and as supervising physician I was immediately available for consultation/collaboration.   EKG Interpretation None         Carmin Muskrat, MD 01/30/14 0007

## 2014-04-17 ENCOUNTER — Ambulatory Visit: Payer: Medicaid Other | Admitting: Hematology and Oncology

## 2014-04-17 ENCOUNTER — Other Ambulatory Visit: Payer: Medicaid Other

## 2014-05-16 ENCOUNTER — Telehealth: Payer: Self-pay | Admitting: Hematology and Oncology

## 2014-05-16 NOTE — Telephone Encounter (Signed)
pt called to r/s 7/28 appt. pt given new appt for lb/NG 9/8 @ 1pm.

## 2014-05-29 ENCOUNTER — Ambulatory Visit: Payer: Medicaid Other | Admitting: Hematology and Oncology

## 2014-05-29 ENCOUNTER — Other Ambulatory Visit: Payer: Medicaid Other

## 2014-05-29 ENCOUNTER — Other Ambulatory Visit: Payer: Self-pay

## 2014-05-31 ENCOUNTER — Telehealth: Payer: Self-pay | Admitting: Hematology and Oncology

## 2014-05-31 NOTE — Telephone Encounter (Signed)
s.w. pt and advised on SEpt appt....pt ok and aware...she will call back once out of meeting

## 2014-06-07 ENCOUNTER — Encounter: Payer: Self-pay | Admitting: Family Medicine

## 2014-06-07 ENCOUNTER — Other Ambulatory Visit (HOSPITAL_COMMUNITY)
Admission: RE | Admit: 2014-06-07 | Discharge: 2014-06-07 | Disposition: A | Discharge status: Home / Self Care | Payer: Medicare Other | Source: Ambulatory Visit | Attending: Family Medicine | Admitting: Family Medicine

## 2014-06-07 ENCOUNTER — Ambulatory Visit (INDEPENDENT_AMBULATORY_CARE_PROVIDER_SITE_OTHER): Payer: Medicare Other | Admitting: Family Medicine

## 2014-06-07 ENCOUNTER — Telehealth: Payer: Self-pay | Admitting: Family Medicine

## 2014-06-07 VITALS — BP 141/95 | HR 88 | Temp 98.2°F | Ht 62.0 in | Wt 275.0 lb

## 2014-06-07 DIAGNOSIS — E1165 Type 2 diabetes mellitus with hyperglycemia: Secondary | ICD-10-CM

## 2014-06-07 DIAGNOSIS — Z202 Contact with and (suspected) exposure to infections with a predominantly sexual mode of transmission: Secondary | ICD-10-CM

## 2014-06-07 DIAGNOSIS — I10 Essential (primary) hypertension: Secondary | ICD-10-CM

## 2014-06-07 DIAGNOSIS — T839XXA Unspecified complication of genitourinary prosthetic device, implant and graft, initial encounter: Secondary | ICD-10-CM | POA: Insufficient documentation

## 2014-06-07 DIAGNOSIS — IMO0002 Reserved for concepts with insufficient information to code with codable children: Secondary | ICD-10-CM

## 2014-06-07 DIAGNOSIS — J301 Allergic rhinitis due to pollen: Secondary | ICD-10-CM

## 2014-06-07 DIAGNOSIS — Z113 Encounter for screening for infections with a predominantly sexual mode of transmission: Secondary | ICD-10-CM | POA: Diagnosis present

## 2014-06-07 DIAGNOSIS — J309 Allergic rhinitis, unspecified: Secondary | ICD-10-CM | POA: Insufficient documentation

## 2014-06-07 DIAGNOSIS — N760 Acute vaginitis: Secondary | ICD-10-CM

## 2014-06-07 DIAGNOSIS — IMO0001 Reserved for inherently not codable concepts without codable children: Secondary | ICD-10-CM

## 2014-06-07 DIAGNOSIS — T8389XA Other specified complication of genitourinary prosthetic devices, implants and grafts, initial encounter: Secondary | ICD-10-CM

## 2014-06-07 LAB — CBC WITH DIFFERENTIAL/PLATELET
Basophils Absolute: 0 10*3/uL (ref 0.0–0.1)
Basophils Relative: 0 % (ref 0–1)
EOS ABS: 0.2 10*3/uL (ref 0.0–0.7)
EOS PCT: 4 % (ref 0–5)
HCT: 39 % (ref 36.0–46.0)
HEMOGLOBIN: 13.2 g/dL (ref 12.0–15.0)
LYMPHS ABS: 2.2 10*3/uL (ref 0.7–4.0)
LYMPHS PCT: 36 % (ref 12–46)
MCH: 28.9 pg (ref 26.0–34.0)
MCHC: 33.8 g/dL (ref 30.0–36.0)
MCV: 85.3 fL (ref 78.0–100.0)
Monocytes Absolute: 0.3 10*3/uL (ref 0.1–1.0)
Monocytes Relative: 5 % (ref 3–12)
Neutro Abs: 3.4 10*3/uL (ref 1.7–7.7)
Neutrophils Relative %: 55 % (ref 43–77)
Platelets: 247 10*3/uL (ref 150–400)
RBC: 4.57 MIL/uL (ref 3.87–5.11)
RDW: 13.6 % (ref 11.5–15.5)
WBC: 6.2 10*3/uL (ref 4.0–10.5)

## 2014-06-07 LAB — POCT GLYCOSYLATED HEMOGLOBIN (HGB A1C): Hemoglobin A1C: 9.9

## 2014-06-07 LAB — BASIC METABOLIC PANEL
BUN: 13 mg/dL (ref 6–23)
CALCIUM: 9.3 mg/dL (ref 8.4–10.5)
CHLORIDE: 102 meq/L (ref 96–112)
CO2: 23 meq/L (ref 19–32)
Creat: 0.73 mg/dL (ref 0.50–1.10)
GLUCOSE: 211 mg/dL — AB (ref 70–99)
Potassium: 4.2 mEq/L (ref 3.5–5.3)
Sodium: 137 mEq/L (ref 135–145)

## 2014-06-07 LAB — POCT WET PREP (WET MOUNT): Clue Cells Wet Prep Whiff POC: POSITIVE

## 2014-06-07 MED ORDER — GLUCOSE BLOOD VI STRP: ORAL_STRIP | Status: DC

## 2014-06-07 MED ORDER — ACCU-CHEK NANO SMARTVIEW W/DEVICE KIT: PACK | Status: DC

## 2014-06-07 MED ORDER — METFORMIN HCL 1000 MG PO TABS: ORAL_TABLET | ORAL | Status: DC

## 2014-06-07 MED ORDER — METRONIDAZOLE 500 MG PO TABS: 500.0000 mg | ORAL_TABLET | Freq: Two times a day (BID) | ORAL | Status: DC

## 2014-06-07 MED ORDER — FLUTICASONE PROPIONATE 50 MCG/ACT NA SUSP: 2.0000 | Freq: Every day | NASAL | Status: DC

## 2014-06-07 MED ORDER — ACCU-CHEK FASTCLIX LANCETS MISC: 1.0000 | Freq: Three times a day (TID) | Status: DC

## 2014-06-07 NOTE — Progress Notes (Signed)
   Subjective:    Patient ID: Melissa Washington, female    DOB: 02/10/1986, 28 y.o.   MRN: [id number]  HPI Melissa Washington is 28 y.o. female for routine physical  Diabetes: Patient has a history of uncontrolled diabetes. She has not taken her medications since the birth of her child 9 months ago. Patient complains of tingling and numbness in her right buttock. This is new for her and started "a few months ago. " Patient has a history of uncontrolled diabetes prior to her last pregnancy. She was controlled through [facility] on insulin. Since the birth of her child 9 months ago she has not taking any medications. She does not have a monitor at home, she states she lost it during the move.   Bowel changes: In addition she complains of loose stools. She states that when she eats she immediately has to go to the bathroom and has cramping and loose stools.  Acute promyelocytic leukemia: Patient in remission, follows up with oncologist every 2 months. She does complain of ongoing bilateral hip pain that has been consistent during her duration of leukemia.  Nostril discomfort: She reports right-sided internal nostril discomfort for the past 3 days. She has had some mild congestion and states she does have seasonal allergies. She has not taken allergy medication daily. She denies rhinorrhea or bleeding from the nose. She denies fever or facial pressure. She endorses headache.  IUD complications/STD concern: Patient states that her partner is complaining of feeling the strings of her IUD. She does not check her IUD, but was told that the strings wrong from a provider who placed the IUD after her postpartum visit. She does not get monthly periods. She is asking to be tested for sexually transmitted diseases, but denies vaginal irritation, abdominal pain or increased discharge. She is monogamous with one partner, who is with her today.   Neuropathy: Patient is reporting neuropathy in her  right buttocks and right leg. She has not been on any medications for her neuropathy. Her diabetes is uncontrolled and currently she isn't taking medications for her diabetes. She denies any nonhealing wounds on her feet.   Review of Systems Per hpi    Objective:   Physical Exam BP 141/95  Pulse 88  Temp(Src) 98.2 F (36.8 C) (Oral)  Ht 5\' 2"  (1.575 m)  Wt 275 lb (124.739 kg)  BMI 50.29 kg/m2 Gen: Pleasant, African American female, extremely morbidly obese, no acute distress, nontoxic in appearance. HEENT: AT. [place]. Bilateral TM visualized and normal in appearance. Bilateral eyes without injections or icterus. MMM. Bilateral nares with erythema and swelling. Throat without erythema or exudates.  CV: RRR no murmurs clicks gallops or rubs Chest: CTAB, no wheeze or crackles.  Diminished breath sounds bilaterally likely due to size Abd: Soft.  morbidly obese. NTND. BS Present. No Masses palpated.  Ext: No erythema.  No edema.  +2/4 PT/DP Skin:  no rashes, purpura or petechiae.  Neuro:  Normal gait. PERLA. EOMi. Alert.  cranial nerves II through XII intact Psych: normal affect, dress, demeanor and mood. Normal speech  GYN:  External genitalia within normal limits.  Vaginal mucosa pink, moist, normal rugae.  Nonfriable cervix without lesions, no discharge. Vaginal  bleeding noted on speculum exam.   No cervical motion tenderness. No adnexal masses bilaterally.  IUD strings identified and trimmed only a few mm. Strings were not that long to begin with.      Assessment & Plan:

## 2014-06-07 NOTE — Telephone Encounter (Addendum)
Please call Melissa Washington, her exam today showed she had BV (this is NOT a sexually transmitted disease). I  Have called in flagyl for her to take. I will call her when her other labs are resulted. In addition her A1c is almost 10. We need to make sure she has scheduled an appointment ASAP to talk about her diabetes. I have refilled her medications and called in a new monitor for her. Thanks.

## 2014-06-07 NOTE — Assessment & Plan Note (Addendum)
To monitor until next appointment for diabetes. If still elevated that time patient will need to be started on medication. Patient could benefit him lisinopril giving her diabetes, however would have to pay close attention to her future fertility plans.

## 2014-06-07 NOTE — Assessment & Plan Note (Addendum)
Patients with signs and symptoms of allergic rhinitis today with erythema and swelling. Prescribed Flonase daily

## 2014-06-07 NOTE — Assessment & Plan Note (Signed)
Will at least need refill of metformin I will prescribe metformin at prior dose for her to taper up. I have written a prescription for a new montior A1c today She needs to make an appointment ASAP for DM management.

## 2014-06-07 NOTE — Assessment & Plan Note (Signed)
Patient has concerns of being exposed to sexual transmitted disease, completed wet prep, gonorrhea and Chlamydia today. Did not collect HIV, hepatitis and RPR at this appointment, may consider on next appointment patient has concerns Will call patient with results.

## 2014-06-07 NOTE — Patient Instructions (Signed)
Diabetes Mellitus and Food It is important for you to manage your blood sugar (glucose) level. Your blood glucose level can be greatly affected by what you eat. Eating healthier foods in the appropriate amounts throughout the day at about the same time each day will help you control your blood glucose level. It can also help slow or prevent worsening of your diabetes mellitus. Healthy eating may even help you improve the level of your blood pressure and reach or maintain a healthy weight.  HOW CAN FOOD AFFECT ME? Carbohydrates Carbohydrates affect your blood glucose level more than any other type of food. Your dietitian will help you determine how many carbohydrates to eat at each meal and teach you how to count carbohydrates. Counting carbohydrates is important to keep your blood glucose at a healthy level, especially if you are using insulin or taking certain medicines for diabetes mellitus. Alcohol Alcohol can cause sudden decreases in blood glucose (hypoglycemia), especially if you use insulin or take certain medicines for diabetes mellitus. Hypoglycemia can be a life-threatening condition. Symptoms of hypoglycemia (sleepiness, dizziness, and disorientation) are similar to symptoms of having too much alcohol.  If your health care provider has given you approval to drink alcohol, do so in moderation and use the following guidelines:  Women should not have more than one drink per day, and men should not have more than two drinks per day. One drink is equal to:  12 oz of beer.  5 oz of wine.  1 oz of hard liquor.  Do not drink on an empty stomach.  Keep yourself hydrated. Have water, diet soda, or unsweetened iced tea.  Regular soda, juice, and other mixers might contain a lot of carbohydrates and should be counted. WHAT FOODS ARE NOT RECOMMENDED? As you make food choices, it is important to remember that all foods are not the same. Some foods have fewer nutrients per serving than other  foods, even though they might have the same number of calories or carbohydrates. It is difficult to get your body what it needs when you eat foods with fewer nutrients. Examples of foods that you should avoid that are high in calories and carbohydrates but low in nutrients include:  Trans fats (most processed foods list trans fats on the Nutrition Facts label).  Regular soda.  Juice.  Candy.  Sweets, such as cake, pie, doughnuts, and cookies.  Fried foods. WHAT FOODS CAN I EAT? Have nutrient-rich foods, which will nourish your body and keep you healthy. The food you should eat also will depend on several factors, including:  The calories you need.  The medicines you take.  Your weight.  Your blood glucose level.  Your blood pressure level.  Your cholesterol level. You also should eat a variety of foods, including:  Protein, such as meat, poultry, fish, tofu, nuts, and seeds (lean animal proteins are best).  Fruits.  Vegetables.  Dairy products, such as milk, cheese, and yogurt (low fat is best).  Breads, grains, pasta, cereal, rice, and beans.  Fats such as olive oil, trans fat-free margarine, canola oil, avocado, and olives. DOES EVERYONE WITH DIABETES MELLITUS HAVE THE SAME MEAL PLAN? Because every person with diabetes mellitus is different, there is not one meal plan that works for everyone. It is very important that you meet with a dietitian who will help you create a meal plan that is just right for you. Document Released: 06/04/2005 Document Revised: 09/12/2013 Document Reviewed: 08/04/2013 [employer] Patient Information 2015 [employer]. This   information is not intended to replace advice given to you by your health care provider. Make sure you discuss any questions you have with your health care provider.  Panic Attacks Panic attacks are sudden, short-livedsurges of severe anxiety, fear, or discomfort. They may occur for no reason when you are relaxed, when you  are anxious, or when you are sleeping. Panic attacks may occur for a number of reasons:   Healthy people occasionally have panic attacks in extreme, life-threatening situations, such as war or natural disasters. Normal anxiety is a protective mechanism of the body that helps [place] react to danger (fight or flight response).  Panic attacks are often seen with anxiety disorders, such as panic disorder, social anxiety disorder, generalized anxiety disorder, and phobias. Anxiety disorders cause excessive or uncontrollable anxiety. They may interfere with your relationships or other life activities.  Panic attacks are sometimes seen with other mental illnesses, such as depression and posttraumatic stress disorder.  Certain medical conditions, prescription medicines, and drugs of abuse can cause panic attacks. SYMPTOMS  Panic attacks start suddenly, peak within 20 minutes, and are accompanied by four or more of the following symptoms:  Pounding heart or fast heart rate (palpitations).  Sweating.  Trembling or shaking.  Shortness of breath or feeling smothered.  Feeling choked.  Chest pain or discomfort.  Nausea or strange feeling in your stomach.  Dizziness, light-headedness, or feeling like you will faint.  Chills or hot flushes.  Numbness or tingling in your lips or hands and feet.  Feeling that things are not real or feeling that you are not yourself.  Fear of losing control or going crazy.  Fear of dying. Some of these symptoms can mimic serious medical conditions. For example, you may think you are having a heart attack. Although panic attacks can be very scary, they are not life threatening. DIAGNOSIS  Panic attacks are diagnosed through an assessment by your health care provider. Your health care provider will ask questions about your symptoms, such as where and when they occurred. Your health care provider will also ask about your medical history and use of alcohol and drugs,  including prescription medicines. Your health care provider may order blood tests or other studies to rule out a serious medical condition. Your health care provider may refer you to a mental health professional for further evaluation. TREATMENT   Most healthy people who have one or two panic attacks in an extreme, life-threatening situation will not require treatment.  The treatment for panic attacks associated with anxiety disorders or other mental illness typically involves counseling with a mental health professional, medicine, or a combination of both. Your health care provider will help determine what treatment is best for you.  Panic attacks due to physical illness usually go away with treatment of the illness. If prescription medicine is causing panic attacks, talk with your health care provider about stopping the medicine, decreasing the dose, or substituting another medicine.  Panic attacks due to alcohol or drug abuse go away with abstinence. Some adults need professional help in order to stop drinking or using drugs. HOME CARE INSTRUCTIONS   Take all medicines as directed by your health care provider.   Schedule and attend follow-up visits as directed by your health care provider. It is important to keep all your appointments. SEEK MEDICAL CARE IF:  You are not able to take your medicines as prescribed.  Your symptoms do not improve or get worse. SEEK IMMEDIATE MEDICAL CARE IF:   You  experience panic attack symptoms that are different than your usual symptoms.  You have serious thoughts about hurting yourself or others.  You are taking medicine for panic attacks and have a serious side effect. MAKE SURE YOU:  Understand these instructions.  Will watch your condition.  Will get help right away if you are not doing well or get worse. Document Released: 09/07/2005 Document Revised: 09/12/2013 Document Reviewed: 04/21/2013 [facility] Patient Information 2015 [place], [place].  This information is not intended to replace advice given to you by your health care provider. Make sure you discuss any questions you have with your health care provider.  If you have a panic attack and can not regain control, please seek treatment at [facility] ED. Please make an appointment to have your diabetes and anxiety managed ASAP Your strings to your IUD was cut down today, unfortunately there is not any further room to cut them down.

## 2014-06-07 NOTE — Assessment & Plan Note (Signed)
IUD strains trimmed only a small amount today. Strings were are quite short. IUD is in place. Followup as needed.

## 2014-06-08 LAB — CERVICOVAGINAL ANCILLARY ONLY
CHLAMYDIA, DNA PROBE: NEGATIVE
Neisseria Gonorrhea: NEGATIVE

## 2014-06-08 NOTE — Telephone Encounter (Signed)
Called and read message to patient.Busick, Robert Lee  

## 2014-06-11 ENCOUNTER — Telehealth: Payer: Self-pay | Admitting: Family Medicine

## 2014-06-11 NOTE — Telephone Encounter (Signed)
Please call Pam[place], her Gonorrhea and chlamydia results are negative. Thanks.

## 2014-06-11 NOTE — Telephone Encounter (Signed)
LVM for patient to call back. ?

## 2014-06-12 ENCOUNTER — Ambulatory Visit: Payer: Medicaid Other | Admitting: Hematology and Oncology

## 2014-06-12 ENCOUNTER — Other Ambulatory Visit: Payer: Medicaid Other

## 2014-06-13 NOTE — Telephone Encounter (Signed)
Spoke with patient and gave below results

## 2014-06-18 ENCOUNTER — Telehealth: Payer: Self-pay | Admitting: Hematology and Oncology

## 2014-06-18 ENCOUNTER — Telehealth: Payer: Self-pay | Admitting: *Deleted

## 2014-06-18 ENCOUNTER — Other Ambulatory Visit: Payer: Self-pay | Admitting: Hematology and Oncology

## 2014-06-18 DIAGNOSIS — C9241 Acute promyelocytic leukemia, in remission: Secondary | ICD-10-CM

## 2014-06-18 NOTE — Telephone Encounter (Signed)
, °

## 2014-06-18 NOTE — Telephone Encounter (Signed)
Pt left a message stating she needs to be seen ASAP. Had to miss last appt due to work/meeting. States her hair is falling out.

## 2014-06-19 ENCOUNTER — Ambulatory Visit (INDEPENDENT_AMBULATORY_CARE_PROVIDER_SITE_OTHER): Payer: Medicare Other | Admitting: Family Medicine

## 2014-06-19 ENCOUNTER — Other Ambulatory Visit: Payer: Self-pay | Admitting: *Deleted

## 2014-06-19 ENCOUNTER — Encounter: Payer: Self-pay | Admitting: Family Medicine

## 2014-06-19 VITALS — BP 152/76 | HR 89 | Temp 97.6°F | Ht 62.0 in | Wt 276.7 lb

## 2014-06-19 DIAGNOSIS — M25559 Pain in unspecified hip: Secondary | ICD-10-CM | POA: Diagnosis not present

## 2014-06-19 DIAGNOSIS — Z23 Encounter for immunization: Secondary | ICD-10-CM

## 2014-06-19 DIAGNOSIS — IMO0001 Reserved for inherently not codable concepts without codable children: Secondary | ICD-10-CM | POA: Diagnosis not present

## 2014-06-19 DIAGNOSIS — F418 Other specified anxiety disorders: Secondary | ICD-10-CM | POA: Insufficient documentation

## 2014-06-19 DIAGNOSIS — F341 Dysthymic disorder: Secondary | ICD-10-CM | POA: Diagnosis not present

## 2014-06-19 DIAGNOSIS — IMO0002 Reserved for concepts with insufficient information to code with codable children: Secondary | ICD-10-CM

## 2014-06-19 DIAGNOSIS — M25552 Pain in left hip: Secondary | ICD-10-CM

## 2014-06-19 DIAGNOSIS — I10 Essential (primary) hypertension: Secondary | ICD-10-CM | POA: Diagnosis not present

## 2014-06-19 DIAGNOSIS — E1165 Type 2 diabetes mellitus with hyperglycemia: Secondary | ICD-10-CM

## 2014-06-19 DIAGNOSIS — N760 Acute vaginitis: Secondary | ICD-10-CM | POA: Diagnosis present

## 2014-06-19 DIAGNOSIS — M25551 Pain in right hip: Secondary | ICD-10-CM | POA: Insufficient documentation

## 2014-06-19 MED ORDER — QUETIAPINE FUMARATE 50 MG PO TABS: ORAL_TABLET | ORAL | Status: DC

## 2014-06-19 MED ORDER — GABAPENTIN 100 MG PO CAPS: 100.0000 mg | ORAL_CAPSULE | Freq: Three times a day (TID) | ORAL | Status: DC

## 2014-06-19 MED ORDER — INSULIN GLARGINE 100 UNIT/ML SOLOSTAR PEN: PEN_INJECTOR | SUBCUTANEOUS | Status: DC

## 2014-06-19 MED ORDER — LISINOPRIL 5 MG PO TABS: 5.0000 mg | ORAL_TABLET | Freq: Every day | ORAL | Status: DC

## 2014-06-19 NOTE — Assessment & Plan Note (Addendum)
Patient still has not picked up her blood glucose monitor, she states she did not it was called in. Advised patient to pick up glucose monitor today. She will need to check her blood sugars every morning fasting, and then prior to each meal and going to bed. We will start Lantus 13 units. She is to go up by 2 units every evening, if her blood sugar is above 110 on her fasting blood glucose. She is to stop going up 2 units every evening when she receives her first blood glucose below 110. Patient is able to give examples and teach back method. Patient understands to get a note book and write down all of her blood glucoses, and that we will be very reviewing this on her followup appointment which is to occur in one month. A detailed AVS was given to patient with all instructions and medications Patient is to continue her metformin. Gabapentin and lisinopril was also started today (see specific details under problem list)

## 2014-06-19 NOTE — Assessment & Plan Note (Addendum)
Patient with a GAD score of 17. She finds it extremely difficult to do her day-to-day work with her anxiety. Her mood disorder questionnaire has 9 "yes "circles, yes to questions to, in serious question 3, which makes a positive mood disorder screening. Her depression screening is also a 17 with extremely difficult circle. - Will start patient on Seroquel 50 mg at night for 3 days, then 100 mg for  3 nights, then 150 mg until I see her again. Consider mood disorder clinic if availability with Dr. Gwenlyn Saran Followup in one month.

## 2014-06-19 NOTE — Patient Instructions (Signed)
I have called in the following medications for you: - Make certain to pick up your glucose monitor, and check your blood glucoses fasting every morning and then prior to each meal. Write these down in a note book. I will need to look at him the next time you come back for diabetes followup, which will be in one month. - I have called shoe in a medication called lisinopril, or cluster at the 5 mg a day dose and workup as necessary. Once you start this medication you'll need to make a nurse's visit in one week to have your blood pressure checked at this clinic. - I have called in insulin, cough Lantus, that you're to take every night before bed, around 10 PM. You are to start at 13 units of Lantus. I want your fasting blood glucose in the morning to be below 110. If it is not below 110 upon checking fasting in the morning, you are to add 2 units to your total dose that evening. Continue to do this until you see a fasting blood glucose below 110.  - Continue to take your metformin. - I have called you and a medication called gabapentin, you will take 100 mg 3 times a day, we will discuss this medication at her one-month appointment and if necessary we can go up on missed doses. - I have called in a medication called Seroquel, you are to take 50 mg at bedtime for the first 3 nights then 100 mg at bedtime for the next 3 nights and then 150 mg at bedtime thereafter into you see me again in one month.

## 2014-06-19 NOTE — Progress Notes (Signed)
Subjective:    Patient ID: Melissa Washington, female    DOB: 10-06-1985, 28 y.o.   MRN: [id number]  HPI Melissa Washington is a 28 y.o. female presents to family medicine clinic for multiple issues  Diabetes: Patient has not picked up her blood glucose monitor as of yet. There seemed to be some confusion, since the pharmacy did not call her do to her it was ready. Patient has been taking her metformin, but does not like to take pills. She denies any hyper or hypoglycemic events. Her A1c on last visit was over 9. She is overdue for an ophthalmology exam and diabetic foot exam.  Neuropathy/bilateral hip pain: Patient states that she has had a pain in her right that shoots midway down her right leg. In addition her hips are starting to hurt again. 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 Patient does not take any over-the-counter medications for these issues currently. She denies any trauma, swelling or erythema. She is morbidly obese.  Hypertension: Patient has now been hypertensive on more than 2 office visits. She has been hypertensive in the past, but is not currently on any types of medications since the birth of her baby 9 months ago. Patient is no longer breast-feeding. She has an IUD for birth control, and does not plan to have any more children. She does not watch the salt in her diet or exercise. She denies any chest pain, shortness of breath, palpitations or dizziness.  Anxiety: Patient states she has increased anxiety that she has been getting worse over the last month. She has never been medicated for anxiety in the past. Her mother has had depression and has been diagnosed with bipolar. Patient states she's had increased feeling of irritability and feels like she just "Zones" out and can't handle a lot of stress. She states nearly every day she feels nervous anxious or on edge. She is not able to stop her control her  worrying. She worries too much about different things nearly every day. She cannot relax. She states she feels her restless the majority of days that she cannot sit still. She denies HI or SI. She is a finding it extremely difficult to function daily with these feelings. In addition she has little or no interest and pleasure in doing things nearly every day. He feels down depressed helpless some days. She has trouble falling asleep on some days because she states she can't stop her mind from racing. Most every day she feels tired or has little energy, she is affected by over eating and feeling bad about herself. She has trouble concentrating nearly every day. She finds it extremely difficult to perform her daily functions with these feelings as well. She personally has never been diagnosed with a mood disorder. She has been told in the past that she might be bipolar. She denies any manic episodes.  Past Medical History  Diagnosis Date  . Hypertension   . Nephrolithiasis   . Hepatic steatosis 01/17/2012  . Cholelithiasis 01/17/2012  . Headache(784.0)   . Bacteremia associated with IV line 05/01/2013    Port-a-cath infection, removed 8/6 by IR. Ecoli grew out in cultures. Sent home with Ceftriaxone to complete 14 day course.   . Bacteremia due to Escherichia coli 05/01/2013    Port-a-cath infection, removed 8/6 by IR. Ecoli grew out in cultures. Sent home with Ceftriaxone to complete 14 day course.   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 Leukemia, acute monocytic, in remission   . Diabetes mellitus     type 2, insulin   Nonsmoker Review of Systems Per history of present illness    Objective:   Physical Exam BP 152/76  Pulse 89  Temp(Src) 97.6 F (36.4 C) (Oral)  Ht 5\' 2"  (1.575 m)  Wt 276 lb 11.2 oz (125.51 kg)  BMI 50.60 kg/m2 Gen: Very pleasant, morbidly obese, African American female in no acute distress, nontoxic in appearance, well-developed, well-nourished. CV:  RRR no murmurs Chest: CTAB, no wheeze or crackles Abd: Soft. Morbidly obese. NTND. BS present. Ext: No erythema. No edema. Normal range of motion. +2/4 DP/PT. Skin: No rashes, purpura or petechiae.  Neuro: Normal gait. PERLA. EOMi. Alert. Cranial nerves II through XII intact Psych: Normal affect, dressed, demeanor and mood. Normal speech. Appears mildly anxious when talking about anxiety/depression and chronic illnesses. Otherwise appears happy and smiling. Partner in room at the time of exam. Foot exam: documented in quality metrics     Assessment & Plan:

## 2014-06-19 NOTE — Assessment & Plan Note (Signed)
Patient started on 5 mg of lisinopril daily. She will need to come in for nurse check in one week, and we will discuss management dependent upon blood pressure at that time

## 2014-06-19 NOTE — Assessment & Plan Note (Signed)
Patient complains of bilateral hip pain, possibly due to arthritis. She has had this hip pain prior. Advised patient to use Tylenol daily as recommended on the side of the bottle. In addition we started gabapentin today. I believe some pain that she is describing is neuropathic pain, nerve pain by the way she describes the radiation in her right leg. Patient is take 100 mg 3 times a day a gabapentin, we will followup in one month. Attempting to refrain from using any type of narcotics. Make attempt [facility] addition on next appointment

## 2014-06-20 ENCOUNTER — Telehealth: Payer: Self-pay | Admitting: Family Medicine

## 2014-06-20 DIAGNOSIS — E1165 Type 2 diabetes mellitus with hyperglycemia: Secondary | ICD-10-CM

## 2014-06-20 DIAGNOSIS — IMO0002 Reserved for concepts with insufficient information to code with codable children: Secondary | ICD-10-CM

## 2014-06-20 NOTE — Telephone Encounter (Signed)
Pt called because the pharmacy is stating that they have not received any prescriptions from [place]. We sent 4 yesterday can someone call and get this straighten. jw

## 2014-06-21 ENCOUNTER — Telehealth: Payer: Self-pay | Admitting: *Deleted

## 2014-06-21 ENCOUNTER — Other Ambulatory Visit: Payer: Self-pay | Admitting: Family Medicine

## 2014-06-21 MED ORDER — INSULIN PEN NEEDLE 31G X 8 MM MISC: Status: DC

## 2014-06-21 NOTE — Telephone Encounter (Signed)
Received [facility] from [facility]--Patient has Lantus Solostar pen and needs Rx for pen needles.  Needles not listed on current med list.  Will route request to PCP.  Burna Forts, BSN, RN-BC

## 2014-06-21 NOTE — Telephone Encounter (Signed)
Needs resent as solstar lancets

## 2014-06-24 ENCOUNTER — Telehealth: Payer: Self-pay | Admitting: Family Medicine

## 2014-06-24 NOTE — Telephone Encounter (Signed)
I attempted to call pharmacy today to straighten out exactly what this patient needs and was unable to reach anyone or leave a message. Please call Monday morning and find out what she needs and order it (it is either lancets or needles for lantus pen). I have received conflicting request. Thanks.

## 2014-06-25 ENCOUNTER — Other Ambulatory Visit: Payer: Self-pay | Admitting: Family Medicine

## 2014-06-25 NOTE — Telephone Encounter (Signed)
Spoke with Pt regarding pen needles for SoloStar pen.  Pt does not need lancets just the pen needles.  [facility]ried calling [facility] a few times with no answer.  Derl Barrow, RN

## 2014-06-26 ENCOUNTER — Ambulatory Visit (HOSPITAL_BASED_OUTPATIENT_CLINIC_OR_DEPARTMENT_OTHER): Payer: Medicare Other | Admitting: Hematology and Oncology

## 2014-06-26 ENCOUNTER — Encounter: Payer: Self-pay | Admitting: Hematology and Oncology

## 2014-06-26 ENCOUNTER — Other Ambulatory Visit (HOSPITAL_BASED_OUTPATIENT_CLINIC_OR_DEPARTMENT_OTHER): Payer: Medicare Other

## 2014-06-26 VITALS — BP 137/92 | HR 84 | Temp 98.1°F | Resp 20 | Ht 62.0 in | Wt 275.8 lb

## 2014-06-26 DIAGNOSIS — C9241 Acute promyelocytic leukemia, in remission: Secondary | ICD-10-CM

## 2014-06-26 DIAGNOSIS — L659 Nonscarring hair loss, unspecified: Secondary | ICD-10-CM

## 2014-06-26 LAB — MORPHOLOGY: PLT EST: ADEQUATE

## 2014-06-26 LAB — COMPREHENSIVE METABOLIC PANEL (CC13)
ALK PHOS: 89 U/L (ref 40–150)
ALT: 18 U/L (ref 0–55)
AST: 14 U/L (ref 5–34)
Albumin: 4.1 g/dL (ref 3.5–5.0)
Anion Gap: 10 mEq/L (ref 3–11)
BUN: 14.8 mg/dL (ref 7.0–26.0)
CO2: 23 mEq/L (ref 22–29)
CREATININE: 1 mg/dL (ref 0.6–1.1)
Calcium: 9.8 mg/dL (ref 8.4–10.4)
Chloride: 105 mEq/L (ref 98–109)
Glucose: 275 mg/dl — ABNORMAL HIGH (ref 70–140)
POTASSIUM: 3.9 meq/L (ref 3.5–5.1)
Sodium: 138 mEq/L (ref 136–145)
Total Bilirubin: 0.8 mg/dL (ref 0.20–1.20)
Total Protein: 7.7 g/dL (ref 6.4–8.3)

## 2014-06-26 LAB — CBC WITH DIFFERENTIAL/PLATELET
BASO%: 0.1 % (ref 0.0–2.0)
Basophils Absolute: 0 10*3/uL (ref 0.0–0.1)
EOS%: 3.4 % (ref 0.0–7.0)
Eosinophils Absolute: 0.2 10*3/uL (ref 0.0–0.5)
HEMATOCRIT: 37.1 % (ref 34.8–46.6)
HGB: 12.9 g/dL (ref 11.6–15.9)
LYMPH#: 2.1 10*3/uL (ref 0.9–3.3)
LYMPH%: 30.1 % (ref 14.0–49.7)
MCH: 28.7 pg (ref 25.1–34.0)
MCHC: 34.8 g/dL (ref 31.5–36.0)
MCV: 82.6 fL (ref 79.5–101.0)
MONO#: 0.4 10*3/uL (ref 0.1–0.9)
MONO%: 5.3 % (ref 0.0–14.0)
NEUT#: 4.2 10*3/uL (ref 1.5–6.5)
NEUT%: 61.1 % (ref 38.4–76.8)
Platelets: 221 10*3/uL (ref 145–400)
RBC: 4.49 10*6/uL (ref 3.70–5.45)
RDW: 12.7 % (ref 11.2–14.5)
WBC: 6.9 10*3/uL (ref 3.9–10.3)
nRBC: 0 % (ref 0–0)

## 2014-06-26 NOTE — Progress Notes (Signed)
[name] OFFICE PROGRESS NOTE  Patient Care Team: Ma Hillock, DO as PCP - General  SUMMARY OF ONCOLOGIC HISTORY: This patient had history of promyelocytic leukemia, diagnosed in March of 2013. She was treated with combination therapy with ATRA and arsenic. Her last molecular studies at [facility] in April 2014 showed no evidence of residual disease. Her prior treatment was complicated by acute cholecystitis requiring cholecystectomy, history of DIC, pneumonia and Klebsiella bacteremia  INTERVAL HISTORY: Please see below for problem oriented charting. She is seen because of concern of abnormal hair loss. She is undergoing a lot of stress, raising her daughter. She also had poorly controlled diabetes. She denies recent infection.  REVIEW OF SYSTEMS:   Constitutional: Denies fevers, chills or abnormal weight loss Eyes: Denies blurriness of vision Ears, nose, mouth, throat, and face: Denies mucositis or sore throat Respiratory: Denies cough, dyspnea or wheezes Cardiovascular: Denies palpitation, chest discomfort or lower extremity swelling Gastrointestinal:  Denies nausea, heartburn or change in bowel habits Skin: Denies abnormal skin rashes Lymphatics: Denies new lymphadenopathy or easy bruising Neurological:Denies numbness, tingling or new weaknesses Behavioral/Psych: Mood is stable, no new changes  All other systems were reviewed with the patient and are negative.  I have reviewed the past medical history, past surgical history, social history and family history with the patient and they are unchanged from previous note.  ALLERGIES:  is allergic to ultram.  MEDICATIONS:  Current Outpatient Prescriptions  Medication Sig Dispense Refill  . ACCU-CHEK FASTCLIX LANCETS MISC 1 each by Does not apply route 3 (three) times daily. Check sugar 3x daily  204 each  3  . Blood Glucose Monitoring Suppl (ACCU-CHEK NANO SMARTVIEW) W/DEVICE KIT Check blood glucose  x3 daily. Must take morning fasting blood glucose  1 kit  0  . fluticasone (FLONASE) 50 MCG/ACT nasal spray Place 2 sprays into both nostrils daily.  16 g  2  . gabapentin (NEURONTIN) 100 MG capsule Take 1 capsule (100 mg total) by mouth 3 (three) times daily.  90 capsule  3  . glucose blood (ACCU-CHEK SMARTVIEW) test strip Check sugar 3 x daily  200 each  3  . Insulin Glargine (LANTUS SOLOSTAR) 100 UNIT/ML Solostar Pen 13 units units at 10 PM nightly  15 mL  3  . Insulin Pen Needle 31G X 8 MM MISC BD UltraFine III Pen Needles. For use with insulin pen device. Inject insulin 6 x daily  200 each  3  . lisinopril (PRINIVIL,ZESTRIL) 5 MG tablet Take 1 tablet (5 mg total) by mouth daily.  90 tablet  3  . metFORMIN (GLUCOPHAGE) 1000 MG tablet Take 500 mg BID for 1 week, then take 1073m BID  90 tablet  2  . QUEtiapine (SEROQUEL) 50 MG tablet 50 mg at bedtime for 3 days, then 100 mg at bedtime for 3 days, on the seventh day dose raised as to 150 mg until your next appointment.  180 tablet  0   No current facility-administered medications for this visit.    PHYSICAL EXAMINATION: ECOG PERFORMANCE STATUS: 0 - Asymptomatic  Filed Vitals:   06/26/14 1235  BP: 137/92  Pulse: 84  Temp: 98.1 F (36.7 C)  Resp: 20   Filed Weights   06/26/14 1235  Weight: 275 lb 12.8 oz (125.102 kg)    GENERAL:alert, no distress and comfortable. She is morbidly obese SKIN: skin color, texture, turgor are normal, no rashes or significant lesions. Mild hair loss is noted on the back  of her head EYES: normal, Conjunctiva are pink and non-injected, sclera clear Musculoskeletal:no cyanosis of digits and no clubbing  NEURO: alert & oriented x 3 with fluent speech, no focal motor/sensory deficits  LABORATORY DATA:  I have reviewed the data as listed    Component Value Date/Time   NA 138 06/26/2014 1221   NA 137 06/07/2014 1023   K 3.9 06/26/2014 1221   K 4.2 06/07/2014 1023   CL 102 06/07/2014 1023   CL 104 02/06/2013  1128   CO2 23 06/26/2014 1221   CO2 23 06/07/2014 1023   GLUCOSE 275* 06/26/2014 1221   GLUCOSE 211* 06/07/2014 1023   GLUCOSE 120* 02/06/2013 1128   BUN 14.8 06/26/2014 1221   BUN 13 06/07/2014 1023   CREATININE 1.0 06/26/2014 1221   CREATININE 0.73 06/07/2014 1023   CREATININE 0.90 01/24/2014 1340   CREATININE 0.63 06/05/2013 1144   CALCIUM 9.8 06/26/2014 1221   CALCIUM 9.3 06/07/2014 1023   PROT 7.7 06/26/2014 1221   PROT 6.6 09/08/2013 1408   ALBUMIN 4.1 06/26/2014 1221   ALBUMIN 2.9* 09/08/2013 1408   AST 14 06/26/2014 1221   AST 21 09/08/2013 1408   ALT 18 06/26/2014 1221   ALT 23 09/08/2013 1408   ALKPHOS 89 06/26/2014 1221   ALKPHOS 177* 09/08/2013 1408   BILITOT 0.80 06/26/2014 1221   BILITOT 0.9 09/08/2013 1408   GFRNONAA 83* 09/08/2013 1408   GFRAA >90 09/08/2013 1408    No results found for this basename: SPEP,  UPEP,   kappa and lambda light chains    Lab Results  Component Value Date   WBC 6.9 06/26/2014   NEUTROABS 4.2 06/26/2014   HGB 12.9 06/26/2014   HCT 37.1 06/26/2014   MCV 82.6 06/26/2014   PLT 221 06/26/2014      Chemistry      Component Value Date/Time   NA 138 06/26/2014 1221   NA 137 06/07/2014 1023   K 3.9 06/26/2014 1221   K 4.2 06/07/2014 1023   CL 102 06/07/2014 1023   CL 104 02/06/2013 1128   CO2 23 06/26/2014 1221   CO2 23 06/07/2014 1023   BUN 14.8 06/26/2014 1221   BUN 13 06/07/2014 1023   CREATININE 1.0 06/26/2014 1221   CREATININE 0.73 06/07/2014 1023   CREATININE 0.90 01/24/2014 1340   CREATININE 0.63 06/05/2013 1144   GLU 479* 03/01/2012 1210      Component Value Date/Time   CALCIUM 9.8 06/26/2014 1221   CALCIUM 9.3 06/07/2014 1023   ALKPHOS 89 06/26/2014 1221   ALKPHOS 177* 09/08/2013 1408   AST 14 06/26/2014 1221   AST 21 09/08/2013 1408   ALT 18 06/26/2014 1221   ALT 23 09/08/2013 1408   BILITOT 0.80 06/26/2014 1221   BILITOT 0.9 09/08/2013 1408      ASSESSMENT & PLAN:  Acute promyelocytic leukemia in remission Clinically, she has no signs and  symptoms to suggest disease recurrence. I will see her back in 6 months a repeat history, physical examination and blood work.  Alopecia I am not sure the cause of this but I do not think this is related to blood disorder. I suspect this could be due to stress and poorly controlled diabetes. I reassured the patient.    Orders Placed This Encounter  Procedures  . PML/RAR Translocation by PCR    Standing Status: Standing     Number of Occurrences: 9     Standing Expiration Date: 06/27/2015   All questions were  answered. The patient knows to call the clinic with any problems, questions or concerns. No barriers to learning was detected. I spent 15 minutes counseling the patient face to face. The total time spent in the appointment was 20 minutes and more than 50% was on counseling and review of test results     [facility], [place], MD 06/26/2014 8:53 PM

## 2014-06-26 NOTE — Telephone Encounter (Signed)
[facility]--received refill for pen needles on 06/21/14.  No refills needed at this time.  Burna Forts, BSN, RN-BC

## 2014-06-26 NOTE — Assessment & Plan Note (Signed)
Clinically, she has no signs and symptoms to suggest disease recurrence. I will see her back in 6 months a repeat history, physical examination and blood work.

## 2014-06-26 NOTE — Assessment & Plan Note (Signed)
I am not sure the cause of this but I do not think this is related to blood disorder. I suspect this could be due to stress and poorly controlled diabetes. I reassured the patient.

## 2014-06-28 ENCOUNTER — Telehealth: Payer: Self-pay | Admitting: Hematology and Oncology

## 2014-06-28 NOTE — Telephone Encounter (Signed)
s.w. pt and advised on April appt.....pt ok and aware °

## 2014-06-29 ENCOUNTER — Telehealth: Payer: Self-pay | Admitting: Family Medicine

## 2014-06-29 NOTE — Telephone Encounter (Signed)
Spoke with patient myself this morning. She has received all medications and supplies through her pharmacy.  Unfortunately, Lantus is no longer going to be covered by her insurance after this month supply. Therefore, we will have to switch her to [email or web address] have asked her to  Make and appointment so the differences between the medications and the dosage could be explained.  Melissa Weinman DO

## 2014-07-23 ENCOUNTER — Encounter: Payer: Self-pay | Admitting: Hematology and Oncology

## 2014-08-19 ENCOUNTER — Telehealth (HOSPITAL_COMMUNITY): Payer: Self-pay

## 2014-08-19 ENCOUNTER — Emergency Department (HOSPITAL_COMMUNITY)
Admission: EM | Admit: 2014-08-19 | Discharge: 2014-08-19 | Disposition: A | Discharge status: Home / Self Care | Payer: Medicare Other | Attending: Emergency Medicine | Admitting: Emergency Medicine

## 2014-08-19 ENCOUNTER — Encounter (HOSPITAL_COMMUNITY): Payer: Self-pay | Admitting: *Deleted

## 2014-08-19 DIAGNOSIS — Z79899 Other long term (current) drug therapy: Secondary | ICD-10-CM | POA: Insufficient documentation

## 2014-08-19 DIAGNOSIS — Z8719 Personal history of other diseases of the digestive system: Secondary | ICD-10-CM | POA: Diagnosis not present

## 2014-08-19 DIAGNOSIS — M79609 Pain in unspecified limb: Secondary | ICD-10-CM

## 2014-08-19 DIAGNOSIS — I10 Essential (primary) hypertension: Secondary | ICD-10-CM | POA: Insufficient documentation

## 2014-08-19 DIAGNOSIS — Z8619 Personal history of other infectious and parasitic diseases: Secondary | ICD-10-CM | POA: Insufficient documentation

## 2014-08-19 DIAGNOSIS — Z87442 Personal history of urinary calculi: Secondary | ICD-10-CM | POA: Diagnosis not present

## 2014-08-19 DIAGNOSIS — Z856 Personal history of leukemia: Secondary | ICD-10-CM | POA: Insufficient documentation

## 2014-08-19 DIAGNOSIS — M79651 Pain in right thigh: Secondary | ICD-10-CM | POA: Insufficient documentation

## 2014-08-19 DIAGNOSIS — E119 Type 2 diabetes mellitus without complications: Secondary | ICD-10-CM | POA: Insufficient documentation

## 2014-08-19 DIAGNOSIS — Z7952 Long term (current) use of systemic steroids: Secondary | ICD-10-CM | POA: Insufficient documentation

## 2014-08-19 DIAGNOSIS — Z794 Long term (current) use of insulin: Secondary | ICD-10-CM | POA: Insufficient documentation

## 2014-08-19 DIAGNOSIS — R2241 Localized swelling, mass and lump, right lower limb: Secondary | ICD-10-CM | POA: Diagnosis present

## 2014-08-19 DIAGNOSIS — R609 Edema, unspecified: Secondary | ICD-10-CM

## 2014-08-19 MED ORDER — OXYCODONE-ACETAMINOPHEN 5-325 MG PO TABS
1.0000 | ORAL_TABLET | Freq: Once | ORAL | Status: AC
  Administered 2014-08-19: 1 via ORAL
  Filled 2014-08-19: qty 1

## 2014-08-19 MED ORDER — IBUPROFEN 400 MG PO TABS
400.0000 mg | ORAL_TABLET | Freq: Once | ORAL | Status: AC
  Administered 2014-08-19: 400 mg via ORAL
  Filled 2014-08-19: qty 1

## 2014-08-19 MED ORDER — OXYCODONE-ACETAMINOPHEN 5-325 MG PO TABS: 1.0000 | ORAL_TABLET | Freq: Once | ORAL | Status: DC

## 2014-08-19 MED ORDER — CYCLOBENZAPRINE HCL 5 MG PO TABS: 10.0000 mg | ORAL_TABLET | Freq: Three times a day (TID) | ORAL | Status: DC | PRN

## 2014-08-19 NOTE — ED Notes (Signed)
MD Kohut at the bedside.

## 2014-08-19 NOTE — Telephone Encounter (Signed)
Pharmacy calling for clarification of percocet instructions Dr Wilson Singer, prescriber consulted and instructions should read 1-2 tablets every 4 hours as needed for pain.

## 2014-08-19 NOTE — ED Notes (Signed)
Pt from home. Woke up with posterior right thigh pain and swelling. Denies injury to leg. Pt is in remission for leukemia. Pt denies sciatica or back pain with thigh pain.

## 2014-08-19 NOTE — ED Provider Notes (Signed)
CSN: [id number]     Arrival date & time 08/19/14  1424 History   First MD Initiated Contact with Patient 08/19/14 1606     Chief Complaint  Patient presents with  . Leg Swelling     (Consider location/radiation/quality/duration/timing/severity/associated sxs/prior Treatment) HPI   28yF with R leg pain. Atraumatic. Woke up with pain in R posterior thigh this morning. Seems swollen to her. Went to bed in usual state of health. Pain worse with movement/palpation. No back pain. Does not extend past knee. No numbness or tingling. No loss of strength. No fever or chills. No rash. No hx of DVT. No hx of similar type pain. No respiratory complaints. No urinary complaints. No incontinence/retention. Denies IVDU.   Past Medical History  Diagnosis Date  . Hypertension   . Nephrolithiasis   . Hepatic steatosis 01/17/2012  . Cholelithiasis 01/17/2012  . Headache(784.0)   . Bacteremia associated with IV line 05/01/2013    Port-a-cath infection, removed 8/6 by IR. Ecoli grew out in cultures. Sent home with Ceftriaxone to complete 14 day course.   . Bacteremia due to Escherichia coli 05/01/2013    Port-a-cath infection, removed 8/6 by IR. Ecoli grew out in cultures. Sent home with Ceftriaxone to complete 14 day course.   . 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 Leukemia, acute monocytic, in remission   . Diabetes mellitus     type 2, insulin   Past Surgical History  Procedure Laterality Date  . Portacath placement      [facility]  . Cholecystectomy  01/31/2012    Procedure: LAPAROSCOPIC CHOLECYSTECTOMY WITH INTRAOPERATIVE CHOLANGIOGRAM;  Surgeon: Joyice Faster. Cornett, MD;  Location: [facility];  Service: General;  Laterality: N/A;   Family History  Problem Relation Age of Onset  . Kidney disease Mother   . Hypertension Mother   . Epilepsy Mother   . Sleep apnea Mother   . Kidney disease Maternal Grandmother   . Diabetes Maternal Grandmother   . Diabetes Father    History  Substance  Use Topics  . Smoking status: Never Smoker   . Smokeless tobacco: Never Used  . Alcohol Use: No   OB History    Gravida Para Term Preterm AB TAB SAB Ectopic Multiple Living   _0 0 0 0 0 0 0 2     Review of Systems  All systems reviewed and negative, other than as noted in HPI.   Allergies  Ultram  Home Medications   Prior to Admission medications   Medication Sig Start Date End Date Taking? Authorizing Provider  ACCU-CHEK FASTCLIX LANCETS MISC 1 each by Does not apply route 3 (three) times daily. Check sugar 3x daily 06/07/14   Renee A Kuneff, DO  Blood Glucose Monitoring Suppl (ACCU-CHEK NANO SMARTVIEW) W/DEVICE KIT Check blood glucose x3 daily. Must take morning fasting blood glucose 06/07/14   Renee A Kuneff, DO  fluticasone (FLONASE) 50 MCG/ACT nasal spray Place 2 sprays into both nostrils daily. 06/07/14   Renee A Kuneff, DO  gabapentin (NEURONTIN) 100 MG capsule Take 1 capsule (100 mg total) by mouth 3 (three) times daily. 06/19/14   Renee A Kuneff, DO  glucose blood (ACCU-CHEK SMARTVIEW) test strip Check sugar 3 x daily 06/07/14   Renee A Kuneff, DO  Insulin Glargine (LANTUS SOLOSTAR) 100 UNIT/ML Solostar Pen 13 units units at 10 PM nightly 06/19/14   Renee A Kuneff, DO  Insulin Pen Needle 31G X 8 MM MISC BD UltraFine III  Pen Needles. For use with insulin pen device. Inject insulin 6 x daily 06/21/14   Renee A Kuneff, DO  lisinopril (PRINIVIL,ZESTRIL) 5 MG tablet Take 1 tablet (5 mg total) by mouth daily. 06/19/14   Renee A Kuneff, DO  metFORMIN (GLUCOPHAGE) 1000 MG tablet Take 500 mg BID for 1 week, then take 1023m BID 06/07/14   Renee A Kuneff, DO  QUEtiapine (SEROQUEL) 50 MG tablet 50 mg at bedtime for 3 days, then 100 mg at bedtime for 3 days, on the seventh day dose raised as to 150 mg until your next appointment. 06/19/14   Renee A Kuneff, DO   BP 134/84 mmHg  Pulse 81  Temp(Src) 98.6 F (37 C) (Oral)  Resp 24  SpO2 99% Physical Exam  Constitutional: She appears  well-developed and well-nourished. No distress.  HENT:  Head: Normocephalic and atraumatic.  Eyes: Conjunctivae are normal. Right eye exhibits no discharge. Left eye exhibits no discharge.  Neck: Neck supple.  Cardiovascular: Normal rate, regular rhythm and normal heart sounds.  Exam reveals no gallop and no friction rub.   No murmur heard. Pulmonary/Chest: Effort normal and breath sounds normal. No respiratory distress.  Abdominal: Soft. She exhibits no distension. There is no tenderness.  Musculoskeletal: She exhibits no edema or tenderness.  RLE grossly normal in appearance. Symmetric as compared to L. I cannot appreciate any swelling.  Tenderness along R posterior thigh. No increased warmth/redness/rash. No palpable cords. Tenderness stops before popliteal fossa. Able to actively range her R knee. Easily palpable DP pulse. Sensation intact to light touch.   Neurological: She is alert.  Skin: Skin is warm and dry.  Psychiatric: She has a normal mood and affect. Her behavior is normal. Thought content normal.  Nursing note and vitals reviewed.   ED Course  Procedures (including critical care time) Labs Review Labs Reviewed - No data to display  Imaging Review No results found.   EKG Interpretation None      MDM   Final diagnoses:  Acute pain of right thigh    28yF with atraumatic R posterior thigh pain. Most consistent with muscular pain although she can't recall any recent injury/strain/overuse. [place] for DVT or Baker's cyst. No lower back/buttock pain. I think lumbar radiculopathy is less likely. NVI intact. No "red flags" identified. Does not seem to be from hip or knee pathology. No skin changes, fever or other symptoms to suggest infectious cause. Plan symptomatic tx at this time. Return precautions discussed.     SVirgel Manifold MD 08/19/14 [phone]

## 2014-08-19 NOTE — ED Notes (Signed)
Patient returned from Vascular. Placed back on the monitor. Waiting for results.

## 2014-08-19 NOTE — Progress Notes (Signed)
VASCULAR LAB PRELIMINARY  PRELIMINARY  PRELIMINARY  PRELIMINARY  Right lower extremity venous Doppler completed.    Preliminary report:  There is no DVT or SVT noted in the right lower extremity.   Juleah Paradise, RVT 08/19/2014, 4:49 PM

## 2014-08-19 NOTE — ED Notes (Signed)
Pt monitored by pulse ox, bp cuff, and 5-lead. 

## 2014-08-19 NOTE — Discharge Instructions (Signed)

## 2014-10-09 ENCOUNTER — Telehealth: Payer: Self-pay | Admitting: Family Medicine

## 2014-10-09 ENCOUNTER — Encounter: Payer: Self-pay | Admitting: Family Medicine

## 2014-10-09 ENCOUNTER — Other Ambulatory Visit (HOSPITAL_COMMUNITY)
Admission: RE | Admit: 2014-10-09 | Discharge: 2014-10-09 | Disposition: A | Discharge status: Home / Self Care | Payer: Medicare Other | Source: Ambulatory Visit | Attending: Family Medicine | Admitting: Family Medicine

## 2014-10-09 ENCOUNTER — Ambulatory Visit (INDEPENDENT_AMBULATORY_CARE_PROVIDER_SITE_OTHER): Payer: Medicare Other | Admitting: Family Medicine

## 2014-10-09 VITALS — BP 136/82 | HR 91 | Temp 97.9°F | Ht 62.0 in | Wt 278.6 lb

## 2014-10-09 DIAGNOSIS — Z113 Encounter for screening for infections with a predominantly sexual mode of transmission: Secondary | ICD-10-CM | POA: Diagnosis present

## 2014-10-09 DIAGNOSIS — Z20828 Contact with and (suspected) exposure to other viral communicable diseases: Secondary | ICD-10-CM

## 2014-10-09 DIAGNOSIS — Z202 Contact with and (suspected) exposure to infections with a predominantly sexual mode of transmission: Secondary | ICD-10-CM

## 2014-10-09 DIAGNOSIS — IMO0002 Reserved for concepts with insufficient information to code with codable children: Secondary | ICD-10-CM

## 2014-10-09 DIAGNOSIS — N760 Acute vaginitis: Secondary | ICD-10-CM

## 2014-10-09 DIAGNOSIS — N898 Other specified noninflammatory disorders of vagina: Secondary | ICD-10-CM

## 2014-10-09 DIAGNOSIS — E1165 Type 2 diabetes mellitus with hyperglycemia: Secondary | ICD-10-CM

## 2014-10-09 DIAGNOSIS — L298 Other pruritus: Secondary | ICD-10-CM

## 2014-10-09 LAB — POCT WET PREP (WET MOUNT): CLUE CELLS WET PREP WHIFF POC: NEGATIVE

## 2014-10-09 MED ORDER — FLUCONAZOLE 150 MG PO TABS: 150.0000 mg | ORAL_TABLET | Freq: Once | ORAL | Status: DC

## 2014-10-09 MED ORDER — TERCONAZOLE 0.4 % VA CREA: 1.0000 | TOPICAL_CREAM | Freq: Every day | VAGINAL | Status: DC

## 2014-10-09 NOTE — Telephone Encounter (Signed)
Please let patient know this was a yeast infection like we thought based on the wet prep results. She should take the medication we discussed.  Hilton Sinclair, MD

## 2014-10-09 NOTE — Patient Instructions (Signed)
This looks like a yeast infection. We are gettng labs today and I will call if any are NOT normal. Take the diflucan once today and another tablet in 3 days if itching is still present. You can also use the cream that you had used before. I have sent this in. Follow up with your primary doctor to keep diabetes controlled.  Best,  Hilton Sinclair, MD

## 2014-10-09 NOTE — Assessment & Plan Note (Signed)
Likely yeast per hx and exam. Also having unprotected sex with low concern by pt for STD but desire for testing. Afebrile. - wet prep - GC/Chlamydia - HIV/RPR - Will empirically treat for yeast with diflucan. Pt also requests a yeast cream she has used in the past which I will prescribe. - F/u with PCP for regular management of DM. - Call at number provided with results [phone])

## 2014-10-09 NOTE — Progress Notes (Signed)
Patient ID: Melissa Washington, female   DOB: 1986-03-13, 29 y.o.   MRN: [id number] Subjective:   CC: Yeast infection 1 week  HPI:   Patient presents to sameday clinic complaining of vaginal itching x 1 week. She has also had thick chalky white discharge during that time. Denies dysuria, vaginal pain, dyspareunia, fevers, abdominal or back pain. Sexually active with 1 female partner with no condoms. Has IUD in place. Scratching so has had some bleeding and irritation around vagina. No sores or ulcers. Has tried a cream rx'ed by [place] way in the past that has not helped. H/o trichomonas that she treated a long time ago. Not concerned for this. No recent antibiotics. Missed menstrual period: Does not have any periods with IUD.    Review of Systems - Per HPI.   PMH - DM II, HTN, morbid obesity, acute promyeolcytic leukemia in remission, allergic rhinitis, IUD complication, exposure to STD in the past, anxiety and depression, alopecia    Objective:  Physical Exam BP 136/82 mmHg  Pulse 91  Temp(Src) 97.9 F (36.6 C) (Oral)  Ht 5\' 2"  (1.575 m)  Wt 278 lb 9.6 oz (126.372 kg)  BMI 50.94 kg/m2 GEN: NAD, pleasant ABD: S/NT/ND GU: normal vaginal introitus and cervix; thick white discharge present No CMT, normal size and mobility of uterus and adnexae No ulcers seen    Assessment:     Melissa Washington is a 29 y.o. female here for vaginal itching.    Plan:     # See problem list and after visit summary for problem-specific plans.   # Health Maintenance: Has already had flu shot.  Follow-up: Follow up in 1-2 weeks PRN lack of improvement.   Hilton Sinclair, MD [place]

## 2014-10-10 LAB — RPR

## 2014-10-10 LAB — GC/CHLAMYDIA PROBE AMP (~~LOC~~) NOT AT ARMC
Chlamydia: NEGATIVE
Neisseria Gonorrhea: NEGATIVE

## 2014-10-10 LAB — HIV ANTIBODY (ROUTINE TESTING W REFLEX): HIV 1&2 Ab, 4th Generation: NONREACTIVE

## 2014-10-10 NOTE — Telephone Encounter (Signed)
Number listed is not working.  Will try calling again later today and will send letter if no answer. Sparsh Callens,CMA

## 2014-10-11 ENCOUNTER — Encounter: Payer: Self-pay | Admitting: *Deleted

## 2014-10-11 NOTE — Telephone Encounter (Signed)
Number listed is not working. Jazmin Hartsell,CMA

## 2014-10-12 ENCOUNTER — Encounter (HOSPITAL_COMMUNITY): Payer: Self-pay | Admitting: Emergency Medicine

## 2014-10-12 ENCOUNTER — Emergency Department (HOSPITAL_COMMUNITY): Payer: Medicare Other

## 2014-10-12 ENCOUNTER — Emergency Department (HOSPITAL_COMMUNITY)
Admission: EM | Admit: 2014-10-12 | Discharge: 2014-10-12 | Disposition: A | Discharge status: Home / Self Care | Payer: Medicare Other | Attending: Emergency Medicine | Admitting: Emergency Medicine

## 2014-10-12 DIAGNOSIS — Z856 Personal history of leukemia: Secondary | ICD-10-CM | POA: Diagnosis not present

## 2014-10-12 DIAGNOSIS — R1012 Left upper quadrant pain: Secondary | ICD-10-CM | POA: Diagnosis not present

## 2014-10-12 DIAGNOSIS — Z7951 Long term (current) use of inhaled steroids: Secondary | ICD-10-CM | POA: Diagnosis not present

## 2014-10-12 DIAGNOSIS — Z3202 Encounter for pregnancy test, result negative: Secondary | ICD-10-CM | POA: Insufficient documentation

## 2014-10-12 DIAGNOSIS — G8929 Other chronic pain: Secondary | ICD-10-CM | POA: Insufficient documentation

## 2014-10-12 DIAGNOSIS — Z79899 Other long term (current) drug therapy: Secondary | ICD-10-CM | POA: Diagnosis not present

## 2014-10-12 DIAGNOSIS — E119 Type 2 diabetes mellitus without complications: Secondary | ICD-10-CM | POA: Diagnosis not present

## 2014-10-12 DIAGNOSIS — R1032 Left lower quadrant pain: Secondary | ICD-10-CM | POA: Diagnosis present

## 2014-10-12 DIAGNOSIS — I10 Essential (primary) hypertension: Secondary | ICD-10-CM | POA: Insufficient documentation

## 2014-10-12 DIAGNOSIS — R109 Unspecified abdominal pain: Secondary | ICD-10-CM

## 2014-10-12 DIAGNOSIS — Z8719 Personal history of other diseases of the digestive system: Secondary | ICD-10-CM | POA: Insufficient documentation

## 2014-10-12 DIAGNOSIS — Z87442 Personal history of urinary calculi: Secondary | ICD-10-CM | POA: Diagnosis not present

## 2014-10-12 DIAGNOSIS — Z794 Long term (current) use of insulin: Secondary | ICD-10-CM | POA: Insufficient documentation

## 2014-10-12 DIAGNOSIS — Z8619 Personal history of other infectious and parasitic diseases: Secondary | ICD-10-CM | POA: Insufficient documentation

## 2014-10-12 LAB — COMPREHENSIVE METABOLIC PANEL
ALBUMIN: 3.9 g/dL (ref 3.5–5.2)
ALT: 25 U/L (ref 0–35)
ANION GAP: 7 (ref 5–15)
AST: 25 U/L (ref 0–37)
Alkaline Phosphatase: 71 U/L (ref 39–117)
BILIRUBIN TOTAL: 0.6 mg/dL (ref 0.3–1.2)
BUN: 14 mg/dL (ref 6–23)
CHLORIDE: 100 meq/L (ref 96–112)
CO2: 28 mmol/L (ref 19–32)
CREATININE: 0.79 mg/dL (ref 0.50–1.10)
Calcium: 9.1 mg/dL (ref 8.4–10.5)
GFR calc Af Amer: >90 mL/min (ref >90)
GFR calc non Af Amer: >90 mL/min (ref >90)
Glucose, Bld: 284 mg/dL — ABNORMAL HIGH (ref 70–99)
Potassium: 4.5 mmol/L (ref 3.5–5.1)
SODIUM: 135 mmol/L (ref 135–145)
Total Protein: 6.6 g/dL (ref 6.0–8.3)

## 2014-10-12 LAB — URINALYSIS, ROUTINE W REFLEX MICROSCOPIC
Bilirubin Urine: NEGATIVE
Glucose, UA: >1000 mg/dL — AB
Hgb urine dipstick: NEGATIVE
KETONES UR: NEGATIVE mg/dL
Leukocytes, UA: NEGATIVE
Nitrite: NEGATIVE
Protein, ur: NEGATIVE mg/dL
SPECIFIC GRAVITY, URINE: 1.015 (ref 1.005–1.030)
UROBILINOGEN UA: 0.2 mg/dL (ref 0.0–1.0)
pH: 6 (ref 5.0–8.0)

## 2014-10-12 LAB — CBC WITH DIFFERENTIAL/PLATELET
Basophils Absolute: 0 10*3/uL (ref 0.0–0.1)
Basophils Relative: 0 % (ref 0–1)
EOS PCT: 1 % (ref 0–5)
Eosinophils Absolute: 0.1 10*3/uL (ref 0.0–0.7)
HCT: 34.3 % — ABNORMAL LOW (ref 36.0–46.0)
HEMOGLOBIN: 11.8 g/dL — AB (ref 12.0–15.0)
Lymphocytes Relative: 38 % (ref 12–46)
Lymphs Abs: 2.4 10*3/uL (ref 0.7–4.0)
MCH: 29 pg (ref 26.0–34.0)
MCHC: 34.4 g/dL (ref 30.0–36.0)
MCV: 84.3 fL (ref 78.0–100.0)
Monocytes Absolute: 0.5 10*3/uL (ref 0.1–1.0)
Monocytes Relative: 8 % (ref 3–12)
NEUTROS ABS: 3.4 10*3/uL (ref 1.7–7.7)
NEUTROS PCT: 53 % (ref 43–77)
Platelets: 212 10*3/uL (ref 150–400)
RBC: 4.07 MIL/uL (ref 3.87–5.11)
RDW: 12.9 % (ref 11.5–15.5)
WBC: 6.5 10*3/uL (ref 4.0–10.5)

## 2014-10-12 LAB — WET PREP, GENITAL
CLUE CELLS WET PREP: NONE SEEN
Trich, Wet Prep: NONE SEEN
Yeast Wet Prep HPF POC: NONE SEEN

## 2014-10-12 LAB — PREGNANCY, URINE: Preg Test, Ur: NEGATIVE

## 2014-10-12 LAB — URINE MICROSCOPIC-ADD ON

## 2014-10-12 MED ORDER — IBUPROFEN 600 MG PO TABS: 600.0000 mg | ORAL_TABLET | Freq: Four times a day (QID) | ORAL | Status: DC | PRN

## 2014-10-12 NOTE — ED Notes (Signed)
Patient transported to Ultrasound 

## 2014-10-12 NOTE — Discharge Instructions (Signed)
Abdominal Pain, Women °Abdominal (stomach, pelvic, or belly) pain can be caused by many things. It is important to tell your doctor: °· The location of the pain. °· Does it come and go or is it present all the time? °· Are there things that start the pain (eating certain foods, exercise)? °· Are there other symptoms associated with the pain (fever, nausea, vomiting, diarrhea)? °All of this is helpful to know when trying to find the cause of the pain. °CAUSES  °· Stomach: virus or bacteria infection, or ulcer. °· Intestine: appendicitis (inflamed appendix), regional ileitis (Crohn's disease), ulcerative colitis (inflamed colon), irritable bowel syndrome, diverticulitis (inflamed diverticulum of the colon), or cancer of the stomach or intestine. °· Gallbladder disease or stones in the gallbladder. °· Kidney disease, kidney stones, or infection. °· Pancreas infection or cancer. °· Fibromyalgia (pain disorder). °· Diseases of the female organs: °¨ Uterus: fibroid (non-cancerous) tumors or infection. °¨ Fallopian tubes: infection or tubal pregnancy. °¨ Ovary: cysts or tumors. °¨ Pelvic adhesions (scar tissue). °¨ Endometriosis (uterus lining tissue growing in the pelvis and on the pelvic organs). °¨ Pelvic congestion syndrome (female organs filling up with blood just before the menstrual period). °¨ Pain with the menstrual period. °¨ Pain with ovulation (producing an egg). °¨ Pain with an IUD (intrauterine device, birth control) in the uterus. °¨ Cancer of the female organs. °· Functional pain (pain not caused by a disease, may improve without treatment). °· Psychological pain. °· Depression. °DIAGNOSIS  °Your doctor will decide the seriousness of your pain by doing an examination. °· Blood tests. °· X-rays. °· Ultrasound. °· CT scan (computed tomography, special type of X-ray). °· MRI (magnetic resonance imaging). °· Cultures, for infection. °· Barium enema (dye inserted in the large intestine, to better view it with  X-rays). °· Colonoscopy (looking in intestine with a lighted tube). °· Laparoscopy (minor surgery, looking in abdomen with a lighted tube). °· Major abdominal exploratory surgery (looking in abdomen with a large incision). °TREATMENT  °The treatment will depend on the cause of the pain.  °· Many cases can be observed and treated at home. °· Over-the-counter medicines recommended by your caregiver. °· Prescription medicine. °· Antibiotics, for infection. °· Birth control pills, for painful periods or for ovulation pain. °· Hormone treatment, for endometriosis. °· Nerve blocking injections. °· Physical therapy. °· Antidepressants. °· Counseling with a psychologist or psychiatrist. °· Minor or major surgery. °HOME CARE INSTRUCTIONS  °· Do not take laxatives, unless directed by your caregiver. °· Take over-the-counter pain medicine only if ordered by your caregiver. Do not take aspirin because it can cause an upset stomach or bleeding. °· Try a clear liquid diet (broth or water) as ordered by your caregiver. Slowly move to a bland diet, as tolerated, if the pain is related to the stomach or intestine. °· Have a thermometer and take your temperature several times a day, and record it. °· Bed rest and sleep, if it helps the pain. °· Avoid sexual intercourse, if it causes pain. °· Avoid stressful situations. °· Keep your follow-up appointments and tests, as your caregiver orders. °· If the pain does not go away with medicine or surgery, you may try: °¨ Acupuncture. °¨ Relaxation exercises (yoga, meditation). °¨ Group therapy. °¨ Counseling. °SEEK MEDICAL CARE IF:  °· You notice certain foods cause stomach pain. °· Your home care treatment is not helping your pain. °· You need stronger pain medicine. °· You want your IUD removed. °· You feel faint or   lightheaded.  You develop nausea and vomiting.  You develop a rash.  You are having side effects or an allergy to your medicine. SEEK IMMEDIATE MEDICAL CARE IF:   Your  pain does not go away or gets worse.  You have a fever.  Your pain is felt only in portions of the abdomen. The right side could possibly be appendicitis. The left lower portion of the abdomen could be colitis or diverticulitis.  You are passing blood in your stools (bright red or black tarry stools, with or without vomiting).  You have blood in your urine.  You develop chills, with or without a fever.  You pass out. MAKE SURE YOU:   Understand these instructions.  Will watch your condition.  Will get help right away if you are not doing well or get worse. Document Released: 07/05/2007 Document Revised: 01/22/2014 Document Reviewed: 07/25/2009 [facility] Patient Information 2015 [place], [place]. This information is not intended to replace advice given to you by your health care provider. Make sure you discuss any questions you have with your health care provider.  Emergency Department Resource Guide 1) Find a Doctor and Pay Out of Pocket Although you won't have to find out who is covered by your insurance plan, it is a good idea to ask around and get recommendations. You will then need to call the office and see if the doctor you have chosen will accept you as a new patient and what types of options they offer for patients who are self-pay. Some doctors offer discounts or will set up payment plans for their patients who do not have insurance, but you will need to ask so you aren't surprised when you get to your appointment.  2) Contact Your Local Health Department Not all health departments have doctors that can see patients for sick visits, but many do, so it is worth a call to see if yours does. If you don't know where your local health department is, you can check in your phone book. The CDC also has a tool to help you locate your state's health department, and many state websites also have listings of all of their local health departments.  3) Find a [facility] [facility] If your illness is  not likely to be very severe or complicated, you may want to try a walk in clinic. These are popping up all over the country in pharmacies, drugstores, and shopping centers. They're usually staffed by nurse practitioners or physician assistants that have been trained to treat common illnesses and complaints. They're usually fairly quick and inexpensive. However, if you have serious medical issues or chronic medical problems, these are probably not your best option.  No Primary Care Doctor: - Call Health Connect at  [phone] - they can help you locate a primary care doctor that  accepts your insurance, provides certain services, etc. - Physician Referral Service- [phone]  Chronic Pain Problems: Organization         Address  Phone   Notes  [facility]  [phone] Patients need to be referred by their primary care doctor.   Medication Assistance: Organization         Address  Phone   Notes  [facility] Medication [facility] [address] [place], [place] [address] [phone] --Must be a resident of [facility] -- Must have NO insurance coverage whatsoever (no Medicaid/ Medicare, etc.) -- The pt. MUST have a primary care doctor that directs their care regularly  and follows them in the community   MedAssist  [phone]   [employer]  [phone]    Agencies that provide inexpensive medical care: Organization         Address  Phone   Notes  [place]  [phone]   [facility]    [phone]   [facility] [place], [place] [address] [phone]   [place]. [address] [place] [phone]   Planned Parenthood    [phone]   [facility]    [phone]   [place] and [place] [address], [place] Phone:  [phone], Fax:  [phone] Hours of Operation:  9 am - 6  pm, M-F.  Also accepts Medicaid/Medicare and self-pay.  [facility] for [place] [address], [address], [place] Phone: [phone], Fax: [phone]. Hours of Operation:  8:30 am - 5:30 pm, M-F.  Also accepts Medicaid and self-pay.  [facility] [place] [address], [place] Phone: [phone]   [place], [place], [place] [phone], Ext. [phone] Mondays & Thursdays: 7-9 AM.  First 15 patients are seen on a first come, first serve basis.    [place] Providers:  Organization         Address  Phone   Notes  [facility] [address], [place] [phone] Also accepts self-pay patients.  [facility] [address], [place]  [phone]   [place], [address], [place] [phone]   [facility] [address] [place] [phone]   Lucianne Lei [address], [place]   [phone] Only accepts [place] Access [place] patients after they have their name applied to their card.   Self-Pay (no insurance) in [facility]:  Organization         Address  Phone   Notes  Sickle Cell Patients, [facility] [place] [phone]   [facility] Urgent Care [place] [phone]   [facility] [place]  [place], [place], [place] [phone]   [facility]/Dr. Osei-Bonsu  [address], [place] [address], [place] [phone] Phone number for both [place] and [place] locations is the same.  Urgent Medical [facility] [address], [place] [phone]   [facility] [address] [place] or [address] [phone]   [facility] [address] [place] [phone], phone; [phone], fax Sees patients 1st and 3rd Saturday of every month.  Must not qualify for public or private insurance (i.e. Medicaid, Medicare, [place] Health Choice, Veterans' Benefits)  Household income should be no more than 200% of the poverty level The clinic cannot treat you if you are pregnant or think you are pregnant  Sexually transmitted diseases are not treated at the clinic.    Dental Care: Organization         Address  Phone  Notes  [facility] Department of [facility] [address] [place], [place] [phone] Accepts children up to age 47 who  are enrolled in Medicaid or [place] Health Choice; pregnant women with a Medicaid card; and children who have applied for Medicaid or [place] Health Choice, but were declined, whose parents can pay a reduced fee at time of service.  [facility] Department of [facility]  [address], [place] [phone] Accepts children up to age 47 who are enrolled in [place] or [place]; pregnant women with a Medicaid card; and children who have applied for Medicaid or [place] Health Choice, but were declined, whose parents can pay a reduced fee at time of service.  [place] Adult Dental Access PROGRAM  [place] [phone] Patients are seen by appointment only. Walk-ins are not accepted. [place] will see patients 74 years of age and older. Monday - Tuesday (8am-5pm) Most Wednesdays (8:30-5pm) $30 per visit, cash only  [facility] Adult Dental Access PROGRAM  [address], [facility] [phone] Patients are seen by appointment only. Walk-ins are not accepted. [place] will see patients 3 years of age and older. One Wednesday Evening (Monthly: Volunteer Based).  $30 per visit, cash only  [place]  [phone] for adults; Children under age 43, call Graduate Pediatric Dentistry at [phone]. Children aged 75-14, please call [phone] to request a pediatric application.  Dental services are provided in all areas of dental care including fillings, crowns and bridges, complete and partial dentures, implants, gum treatment, root canals, and extractions. Preventive care is also provided. Treatment is provided to both adults and children. Patients are selected via a lottery and there is often a waiting list.   [facility] [address] [place]  [phone] [email or web address]   [facility] [address] [place], [place] [phone], Ext. [phone] Second and Fourth Thursday of each month, opens at 6:30 AM; Clinic ends at 9 AM.  Patients are seen on a first-come first-served basis, and a limited number are seen during each clinic.   [facility]  [address] Hillard Danker [place], [place] [phone]   Eligibility Requirements You must have lived in [place], [place], or [place] counties for at least the last three months.   You cannot be eligible for state or federal sponsored [employer], including [employer], [place], or [employer].   You generally cannot be eligible for healthcare insurance through your employer.    How to apply: Eligibility screenings are held every Tuesday and Wednesday afternoon from 1:00 pm until 4:00 pm. You do not need an appointment for the interview!  [facility] [address], [place], [place]   [place]  [place] Department  [place]  [phone]    Behavioral Health Resources in the Community: Intensive Outpatient Programs Organization         Address  Phone  Notes  [place]. [address], [place], [place] [phone]   [facility] Outpatient [address] [place], [place]   ADS: Alcohol & Drug Svcs  [address] [place], [place]   [place] [address],  [place], [place] or [phone]   Substance Abuse Resources Organization         Address  Phone  Notes  Alcohol and Drug Services  [place]  [phone]  The [place]   Chinita Pester  [phone]   Residential & Outpatient Substance Abuse Program  [phone]   Psychological Services Organization         Address  Phone  Notes  [facility] [place]  [place]  [phone]   [place] [address] [place] or [phone]    Mobile Crisis Teams Organization         Address  Phone  Notes  [facility], Mobile Crisis Care Unit  [phone]   [facility]ive Psychotherapeutic Services  [address] [place], [place]   [place] Levels [address], [place] [phone]    Self-Help/Support Groups Organization         Address  Phone             Notes  [place]. of [place] - variety of support groups  [place] Call for more information  Narcotics Anonymous (NA), [facility] [address], [employer] [place]  2 meetings at this location   Special educational needs teacher         Address  Phone  Notes  [facility] Residential Treatment [place],    [place]  [phone]   [facility]  [address] [place] [phone], [place], [place]   [place], [place] [phone] Admissions: 8am-3pm M-F  Incentives Substance [place] [address],    [place], [place] [phone]   [facility] [address] [place], [place], [place]   [facility] [address],  [place], [place]   [facility] Programs - Intensive Outpatient [address]een [address], [place], [place]   [facility] ([place].) [place].,  [place], [place] [phone] or [phone]   [facility]idential Treatment Services (RTS) [address], [place], [place] Accepts Medicaid  [facility] [place] [address],  [place] [phone] Substance Abuse/Addiction Treatment   [facility] Organization         Address  Phone  Notes  [facility]  [phone]   Domenic Schwab, PhD [address] [place], [place]   [phone] or [phone]   [place]ar [place], [place] [phone]   [facility] [address], [place], [place] [phone] Insurance/Medicaid/sponsorship through [facility] and Families [address]                                    [place], [place] [phone] [place] [address], [place] [phone]    Dr. Adele Schilder  [phone]   [facility] Dept. 1) [address], [place] 2) [place] 3)  [place], [place] [phone]  [phone]   [place] [phone] or [phone] (After Hours)

## 2014-10-12 NOTE — ED Provider Notes (Signed)
CSN: [id number]     Arrival date & time 10/12/14  3976 History   First MD Initiated Contact with Patient 10/12/14 [phone]     Chief Complaint  Patient presents with  . Abdominal Pain     (Consider location/radiation/quality/duration/timing/severity/associated sxs/prior Treatment) HPI The patient reports gradual onset of left lower abdominal pain starting yesterday evening. She reports that this morning when she woke up it was much worse. She reports she has increased pain with urination. There is increased pain with movement. The patient has not had any vomiting or diarrhea. She does report chronically she has some problems with irregular bowel movements and diarrhea. She denies any abnormal vaginal discharge. The patient poor she's had similar type pain with kidney stones and kidney infection. Past Medical History  Diagnosis Date  . Hypertension   . Nephrolithiasis   . Hepatic steatosis 01/17/2012  . Cholelithiasis 01/17/2012  . Headache(784.0)   . Bacteremia associated with IV line 05/01/2013    Port-a-cath infection, removed 8/6 by IR. Ecoli grew out in cultures. Sent home with Ceftriaxone to complete 14 day course.   . Bacteremia due to Escherichia coli 05/01/2013    Port-a-cath infection, removed 8/6 by IR. Ecoli grew out in cultures. Sent home with Ceftriaxone to complete 14 day course.   . 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 Leukemia, acute monocytic, in remission   . Diabetes mellitus     type 2, insulin   Past Surgical History  Procedure Laterality Date  . Portacath placement      [facility]  . Cholecystectomy  01/31/2012    Procedure: LAPAROSCOPIC CHOLECYSTECTOMY WITH INTRAOPERATIVE CHOLANGIOGRAM;  Surgeon: Joyice Faster. Cornett, MD;  Location: [facility];  Service: General;  Laterality: N/A;   Family History  Problem Relation Age of Onset  . Kidney disease Mother   . Hypertension Mother   . Epilepsy Mother   . Sleep apnea Mother   . Kidney disease Maternal  Grandmother   . Diabetes Maternal Grandmother   . Diabetes Father    History  Substance Use Topics  . Smoking status: Never Smoker   . Smokeless tobacco: Never Used  . Alcohol Use: No   OB History    Gravida Para Term Preterm AB TAB SAB Ectopic Multiple Living   '2 2 2 ' 0 0 0 0 0 0 2     Review of Systems  10 Systems reviewed and are negative for acute change except as noted in the HPI.   Allergies  Ultram  Home Medications   Prior to Admission medications   Medication Sig Start Date End Date Taking? Authorizing Provider  ACCU-CHEK FASTCLIX LANCETS MISC 1 each by Does not apply route 3 (three) times daily. Check sugar 3x daily 06/07/14   Renee A Kuneff, DO  Blood Glucose Monitoring Suppl (ACCU-CHEK NANO SMARTVIEW) W/DEVICE KIT Check blood glucose x3 daily. Must take morning fasting blood glucose 06/07/14   Renee A Kuneff, DO  cyclobenzaprine (FLEXERIL) 5 MG tablet Take 2 tablets (10 mg total) by mouth 3 (three) times daily as needed for muscle spasms. 08/19/14   Virgel Manifold, MD  fluconazole (DIFLUCAN) 150 MG tablet Take 1 tablet (150 mg total) by mouth once. 10/09/14   Hilton Sinclair, MD  fluticasone (FLONASE) 50 MCG/ACT nasal spray Place 2 sprays into both nostrils daily. 06/07/14   Renee A Kuneff, DO  gabapentin (NEURONTIN) 100 MG capsule Take 1 capsule (100 mg total) by mouth 3 (three) times daily. 06/19/14  Renee A Kuneff, DO  glucose blood (ACCU-CHEK SMARTVIEW) test strip Check sugar 3 x daily 06/07/14   Renee A Kuneff, DO  ibuprofen (ADVIL,MOTRIN) 600 MG tablet Take 1 tablet (600 mg total) by mouth every 6 (six) hours as needed. 10/12/14   Charlesetta Shanks, MD  Insulin Glargine (LANTUS SOLOSTAR) 100 UNIT/ML Solostar Pen 13 units units at 10 PM nightly 06/19/14   Renee A Kuneff, DO  Insulin Pen Needle 31G X 8 MM MISC BD UltraFine III Pen Needles. For use with insulin pen device. Inject insulin 6 x daily 06/21/14   Renee A Kuneff, DO  lisinopril (PRINIVIL,ZESTRIL) 5 MG tablet  Take 1 tablet (5 mg total) by mouth daily. 06/19/14   Renee A Kuneff, DO  metFORMIN (GLUCOPHAGE) 1000 MG tablet Take 500 mg BID for 1 week, then take 1032m BID 06/07/14   Renee A Kuneff, DO  oxyCODONE-acetaminophen (PERCOCET/ROXICET) 5-325 MG per tablet Take 1-2 tablets by mouth once. 08/19/14   SVirgel Manifold MD  QUEtiapine (SEROQUEL) 50 MG tablet 50 mg at bedtime for 3 days, then 100 mg at bedtime for 3 days, on the seventh day dose raised as to 150 mg until your next appointment. 06/19/14   Renee A Kuneff, DO  terconazole (TERAZOL 7) 0.4 % vaginal cream Place 1 applicator vaginally at bedtime. 10/09/14 10/16/14  MHilton Sinclair MD   BP 116/97 mmHg  Pulse 66  Temp(Src) 98.4 F (36.9 C) (Oral)  Resp 16  SpO2 100% Physical Exam  Constitutional: She is oriented to person, place, and time.  The patient is morbidly obese. She is nontoxic and alert. She is in no acute distress.  HENT:  Head: Normocephalic and atraumatic.  Eyes: EOM are normal. Pupils are equal, round, and reactive to light.  Neck: Neck supple.  Cardiovascular: Normal rate, regular rhythm, normal heart sounds and intact distal pulses.   Pulmonary/Chest: Effort normal and breath sounds normal.  Abdominal: Soft. Bowel sounds are normal. She exhibits no distension. There is tenderness (patient endorses tenderness to palpation in the left lower and left upper quadrants. There is no guarding present. Patient also endorses pain to percussion in the left CVA area.).  Genitourinary: Vagina normal and uterus normal.  Speculum examination. No significant amount of discharge the vaginal vault. Cervix is not friable. IUD strings visible. On manual examination no cervical motion tenderness. Mild to moderate adnexal tenderness on the left. No appreciable mass. Right is nontender.  Musculoskeletal: Normal range of motion. She exhibits no edema.  Neurological: She is alert and oriented to person, place, and time. She has normal strength.  Coordination normal. GCS eye subscore is 4. GCS verbal subscore is 5. GCS motor subscore is 6.  Skin: Skin is warm, dry and intact.  Psychiatric: She has a normal mood and affect.    ED Course  Procedures (including critical care time) Labs Review Labs Reviewed  WET PREP, GENITAL - Abnormal; Notable for the following:    WBC, Wet Prep HPF POC FEW (*)    All other components within normal limits  URINALYSIS, ROUTINE W REFLEX MICROSCOPIC - Abnormal; Notable for the following:    Glucose, UA >1000 (*)    All other components within normal limits  COMPREHENSIVE METABOLIC PANEL - Abnormal; Notable for the following:    Glucose, Bld 284 (*)    All other components within normal limits  CBC WITH DIFFERENTIAL - Abnormal; Notable for the following:    Hemoglobin 11.8 (*)    HCT 34.3 (*)  All other components within normal limits  URINE MICROSCOPIC-ADD ON - Abnormal; Notable for the following:    Bacteria, UA FEW (*)    All other components within normal limits  URINE CULTURE  PREGNANCY, URINE  GC/CHLAMYDIA PROBE AMP ([place])    Imaging Review US Transvaginal Non-ob  10/12/2014   CLINICAL DATA:  Initial encounter for left lower quadrant pain.  EXAM: TRANSABDOMINAL AND TRANSVAGINAL ULTRASOUND OF PELVIS  DOPPLER ULTRASOUND OF OVARIES  TECHNIQUE: Both transabdominal and transvaginal ultrasound examinations of the pelvis were performed. Transabdominal technique was performed for global imaging of the pelvis including uterus, ovaries, adnexal regions, and pelvic cul-de-sac.  It was necessary to proceed with endovaginal exam following the transabdominal exam to visualize the endometrium and ovaries. Color and duplex Doppler ultrasound was utilized to evaluate blood flow to the ovaries.  COMPARISON:  CT 09/08/2013  FINDINGS: Uterus  Measurements: 4.7 x 6.1 x 12.3 cm. No fibroids or other mass visualized.  Endometrium  Thickness: 7 mm. IUD appears appropriately positioned. No focal endometrial  abnormality is evident.  Right ovary  Measurements: 2.1 x 3.0 x 3.3 cm. Normal appearance/no adnexal mass.  Left ovary  Measurements: 2.3 x 3.3 x 4.2 cm. Normal appearance/no adnexal mass.  Pulsed Doppler evaluation of both ovaries demonstrates normal low-resistance arterial and venous waveforms.  Other findings  No free fluid.  IMPRESSION: 1. Normal uterus and ovaries. 2. Satisfactorily positioned IUD   Electronically Signed   By: Andreas Newport M.D.   On: 10/12/2014 10:09   US Pelvis Complete  10/12/2014   CLINICAL DATA:  Initial encounter for left lower quadrant pain.  EXAM: TRANSABDOMINAL AND TRANSVAGINAL ULTRASOUND OF PELVIS  DOPPLER ULTRASOUND OF OVARIES  TECHNIQUE: Both transabdominal and transvaginal ultrasound examinations of the pelvis were performed. Transabdominal technique was performed for global imaging of the pelvis including uterus, ovaries, adnexal regions, and pelvic cul-de-sac.  It was necessary to proceed with endovaginal exam following the transabdominal exam to visualize the endometrium and ovaries. Color and duplex Doppler ultrasound was utilized to evaluate blood flow to the ovaries.  COMPARISON:  CT 09/08/2013  FINDINGS: Uterus  Measurements: 4.7 x 6.1 x 12.3 cm. No fibroids or other mass visualized.  Endometrium  Thickness: 7 mm. IUD appears appropriately positioned. No focal endometrial abnormality is evident.  Right ovary  Measurements: 2.1 x 3.0 x 3.3 cm. Normal appearance/no adnexal mass.  Left ovary  Measurements: 2.3 x 3.3 x 4.2 cm. Normal appearance/no adnexal mass.  Pulsed Doppler evaluation of both ovaries demonstrates normal low-resistance arterial and venous waveforms.  Other findings  No free fluid.  IMPRESSION: 1. Normal uterus and ovaries. 2. Satisfactorily positioned IUD   Electronically Signed   By: Andreas Newport M.D.   On: 10/12/2014 10:09   [place] Art/ven Flow Abd Pelv Doppler  10/12/2014   CLINICAL DATA:  Initial encounter for left lower quadrant pain.  EXAM:  TRANSABDOMINAL AND TRANSVAGINAL ULTRASOUND OF PELVIS  DOPPLER ULTRASOUND OF OVARIES  TECHNIQUE: Both transabdominal and transvaginal ultrasound examinations of the pelvis were performed. Transabdominal technique was performed for global imaging of the pelvis including uterus, ovaries, adnexal regions, and pelvic cul-de-sac.  It was necessary to proceed with endovaginal exam following the transabdominal exam to visualize the endometrium and ovaries. Color and duplex Doppler ultrasound was utilized to evaluate blood flow to the ovaries.  COMPARISON:  CT 09/08/2013  FINDINGS: Uterus  Measurements: 4.7 x 6.1 x 12.3 cm. No fibroids or other mass visualized.  Endometrium  Thickness: 7 mm.  IUD appears appropriately positioned. No focal endometrial abnormality is evident.  Right ovary  Measurements: 2.1 x 3.0 x 3.3 cm. Normal appearance/no adnexal mass.  Left ovary  Measurements: 2.3 x 3.3 x 4.2 cm. Normal appearance/no adnexal mass.  Pulsed Doppler evaluation of both ovaries demonstrates normal low-resistance arterial and venous waveforms.  Other findings  No free fluid.  IMPRESSION: 1. Normal uterus and ovaries. 2. Satisfactorily positioned IUD   Electronically Signed   By: Andreas Newport M.D.   On: 10/12/2014 10:09     EKG Interpretation None      MDM   Final diagnoses:  Abdominal pain   At this time diagnostic studies are within normal limits. Pelvic examination does not suggest PID. Pelvic ultrasound does not show other abnormality. Urinalysis is negative for blood or white cells. Clinically this is atypical for a kidney stone, the pain is described as gradual in onset and worsened by position changes and palpation of the abdomen, also there is no blood present in the urine. At this time I do feel patient is safe for discharge with conservative treatment. She is advised to follow-up with her family physician on Monday and return to the emergency department if her symptoms are worsening or  changing.    Charlesetta Shanks, MD 10/12/14 1030

## 2014-10-12 NOTE — ED Notes (Addendum)
Patient presents with LLQ abdominal pain with radiation to the groin. Patient has a hx of kidney infection and UTI and states this pain feels like the same.  CBG 312 hx of diabetes and HTN. BP 160 palp HR 90. Denies hematuria. Reports dysuria. Denies n/v

## 2014-10-13 LAB — URINE CULTURE: Colony Count: >=100000

## 2014-10-15 LAB — GC/CHLAMYDIA PROBE AMP (~~LOC~~) NOT AT ARMC
Chlamydia: NEGATIVE
NEISSERIA GONORRHEA: NEGATIVE

## 2014-11-21 ENCOUNTER — Encounter: Payer: Self-pay | Admitting: *Deleted

## 2014-11-21 ENCOUNTER — Other Ambulatory Visit: Payer: Self-pay | Admitting: Family Medicine

## 2014-11-21 MED ORDER — INSULIN GLARGINE 100 UNIT/ML ~~LOC~~ SOLN: 13.0000 [IU] | Freq: Every day | SUBCUTANEOUS | Status: DC

## 2014-11-21 MED ORDER — "INSULIN SYRINGE 31G X 5/16"" 1 ML MISC": Status: DC

## 2014-11-21 NOTE — Progress Notes (Signed)
Prior Authorization received from [facility] for [employer].  PA form placed in provider box for completion. Derl Barrow, RN

## 2014-11-21 NOTE — Progress Notes (Signed)
Rx for vials sent in to pharmacy.

## 2014-11-22 ENCOUNTER — Other Ambulatory Visit: Payer: Self-pay | Admitting: Family Medicine

## 2014-11-22 MED ORDER — INSULIN DETEMIR 100 UNIT/ML ~~LOC~~ SOLN: 13.0000 [IU] | Freq: Every day | SUBCUTANEOUS | Status: DC

## 2014-11-22 NOTE — Progress Notes (Signed)
Tried to call pt regarding medication change to Levamir.  Phone numbers are invalid.  Derl Barrow, RN

## 2014-12-26 ENCOUNTER — Other Ambulatory Visit: Payer: Medicare Other

## 2014-12-26 ENCOUNTER — Encounter: Payer: Medicare Other | Admitting: Hematology and Oncology

## 2014-12-27 NOTE — Progress Notes (Signed)
This encounter was created in error - please disregard.

## 2015-01-20 ENCOUNTER — Emergency Department (INDEPENDENT_AMBULATORY_CARE_PROVIDER_SITE_OTHER)
Admission: EM | Admit: 2015-01-20 | Discharge: 2015-01-20 | Disposition: A | Discharge status: Home / Self Care | Payer: Medicare Other | Source: Home / Self Care | Attending: Family Medicine | Admitting: Family Medicine

## 2015-01-20 ENCOUNTER — Other Ambulatory Visit (HOSPITAL_COMMUNITY)
Admission: RE | Admit: 2015-01-20 | Discharge: 2015-01-20 | Disposition: A | Discharge status: Home / Self Care | Payer: Medicare Other | Source: Ambulatory Visit | Attending: Family Medicine | Admitting: Family Medicine

## 2015-01-20 DIAGNOSIS — Z113 Encounter for screening for infections with a predominantly sexual mode of transmission: Secondary | ICD-10-CM | POA: Diagnosis present

## 2015-01-20 DIAGNOSIS — N760 Acute vaginitis: Secondary | ICD-10-CM | POA: Diagnosis present

## 2015-01-20 DIAGNOSIS — R739 Hyperglycemia, unspecified: Secondary | ICD-10-CM

## 2015-01-20 LAB — POCT I-STAT, CHEM 8
BUN: 13 mg/dL (ref 6–20)
CHLORIDE: 101 mmol/L (ref 101–111)
Calcium, Ion: 1.17 mmol/L (ref 1.12–1.23)
Creatinine, Ser: 0.6 mg/dL (ref 0.44–1.00)
GLUCOSE: 366 mg/dL — AB (ref 70–99)
HCT: 43 % (ref 36.0–46.0)
HEMOGLOBIN: 14.6 g/dL (ref 12.0–15.0)
Potassium: 4 mmol/L (ref 3.5–5.1)
SODIUM: 136 mmol/L (ref 135–145)
TCO2: 20 mmol/L (ref 0–100)

## 2015-01-20 LAB — POCT URINALYSIS DIP (DEVICE)
Bilirubin Urine: NEGATIVE
Glucose, UA: >=1000 mg/dL — AB
Hgb urine dipstick: NEGATIVE
Ketones, ur: 15 mg/dL — AB
LEUKOCYTES UA: NEGATIVE
Nitrite: NEGATIVE
Protein, ur: NEGATIVE mg/dL
Urobilinogen, UA: 0.2 mg/dL (ref 0.0–1.0)
pH: 5.5 (ref 5.0–8.0)

## 2015-01-20 LAB — POCT PREGNANCY, URINE: Preg Test, Ur: NEGATIVE

## 2015-01-20 MED ORDER — "INSULIN SYRINGE 31G X 5/16"" 1 ML MISC": Status: DC

## 2015-01-20 MED ORDER — FLUCONAZOLE 150 MG PO TABS: 150.0000 mg | ORAL_TABLET | Freq: Every day | ORAL | Status: DC

## 2015-01-20 MED ORDER — METRONIDAZOLE 0.75 % VA GEL: 1.0000 | Freq: Two times a day (BID) | VAGINAL | Status: DC

## 2015-01-20 MED ORDER — INSULIN DETEMIR 100 UNIT/ML ~~LOC~~ SOLN: 10.0000 [IU] | Freq: Every day | SUBCUTANEOUS | Status: DC

## 2015-01-20 NOTE — Discharge Instructions (Signed)
Bacterial Vaginosis Bacterial vaginosis is a vaginal infection that occurs when the normal balance of bacteria in the vagina is disrupted. It results from an overgrowth of certain bacteria. This is the most common vaginal infection in women of childbearing age. Treatment is important to prevent complications, especially in pregnant women, as it can cause a premature delivery. CAUSES  Bacterial vaginosis is caused by an increase in harmful bacteria that are normally present in smaller amounts in the vagina. Several different kinds of bacteria can cause bacterial vaginosis. However, the reason that the condition develops is not fully understood. RISK FACTORS Certain activities or behaviors can put you at an increased risk of developing bacterial vaginosis, including:  Having a new sex partner or multiple sex partners.  Douching.  Using an intrauterine device (IUD) for contraception. Women do not get bacterial vaginosis from toilet seats, bedding, swimming pools, or contact with objects around them. SIGNS AND SYMPTOMS  Some women with bacterial vaginosis have no signs or symptoms. Common symptoms include:  Grey vaginal discharge.  A fishlike odor with discharge, especially after sexual intercourse.  Itching or burning of the vagina and vulva.  Burning or pain with urination. DIAGNOSIS  Your health care provider will take a medical history and examine the vagina for signs of bacterial vaginosis. A sample of vaginal fluid may be taken. Your health care provider will look at this sample under a microscope to check for bacteria and abnormal cells. A vaginal pH test may also be done.  TREATMENT  Bacterial vaginosis may be treated with antibiotic medicines. These may be given in the form of a pill or a vaginal cream. A second round of antibiotics may be prescribed if the condition comes back after treatment.  HOME CARE INSTRUCTIONS   Only take over-the-counter or prescription medicines as  directed by your health care provider.  If antibiotic medicine was prescribed, take it as directed. Make sure you finish it even if you start to feel better.  Do not have sex until treatment is completed.  Tell all sexual partners that you have a vaginal infection. They should see their health care provider and be treated if they have problems, such as a mild rash or itching.  Practice safe sex by using condoms and only having one sex partner. SEEK MEDICAL CARE IF:   Your symptoms are not improving after 3 days of treatment.  You have increased discharge or pain.  You have a fever. MAKE SURE YOU:   Understand these instructions.  Will watch your condition.  Will get help right away if you are not doing well or get worse. FOR MORE INFORMATION  Centers for Disease Control and Prevention, Division of STD Prevention: [employer]raiser[employer]raud[employer] American Sexual Health Association (ASHA): www.ashastd.org  Document Released: 09/07/2005 Document Revised: 06/28/2013 Document Reviewed: 04/19/2013 [facility] Patient Information 2015 [place], [place]. This information is not intended to replace advice given to you by your health care provider. Make sure you discuss any questions you have with your health care provider.  Hyperglycemia Hyperglycemia occurs when the glucose (sugar) in your blood is too high. Hyperglycemia can happen for many reasons, but it most often happens to people who do not know they have diabetes or are not managing their diabetes properly.  CAUSES  Whether you have diabetes or not, there are other causes of hyperglycemia. Hyperglycemia can occur when you have diabetes, but it can also occur in other situations that you might not be as aware of, such as: Diabetes  If you  have diabetes and are having problems controlling your blood glucose, hyperglycemia could occur because of some of the following reasons:  Not following your meal plan.  Not taking your diabetes medications or not  taking it properly.  Exercising less or doing less activity than you normally do.  Being sick. Pre-diabetes  This cannot be ignored. Before people develop Type 2 diabetes, they almost always have "pre-diabetes." This is when your blood glucose levels are higher than normal, but not yet high enough to be diagnosed as diabetes. Research has shown that some long-term damage to the body, especially the heart and circulatory system, may already be occurring during pre-diabetes. If you take action to manage your blood glucose when you have pre-diabetes, you may delay or prevent Type 2 diabetes from developing. Stress  If you have diabetes, you may be "diet" controlled or on oral medications or insulin to control your diabetes. However, you may find that your blood glucose is higher than usual in the hospital whether you have diabetes or not. This is often referred to as "stress hyperglycemia." Stress can elevate your blood glucose. This happens because of hormones put out by the body during times of stress. If stress has been the cause of your high blood glucose, it can be followed regularly by your caregiver. That way he/she can make sure your hyperglycemia does not continue to get worse or progress to diabetes. Steroids  Steroids are medications that act on the infection fighting system (immune system) to block inflammation or infection. One side effect can be a rise in blood glucose. Most people can produce enough extra insulin to allow for this rise, but for those who cannot, steroids make blood glucose levels go even higher. It is not unusual for steroid treatments to "uncover" diabetes that is developing. It is not always possible to determine if the hyperglycemia will go away after the steroids are stopped. A special blood test called an A1c is sometimes done to determine if your blood glucose was elevated before the steroids were started. SYMPTOMS  Thirsty.  Frequent urination.  Dry  mouth.  Blurred vision.  Tired or fatigue.  Weakness.  Sleepy.  Tingling in feet or leg. DIAGNOSIS  Diagnosis is made by monitoring blood glucose in one or all of the following ways:  A1c test. This is a chemical found in your blood.  Fingerstick blood glucose monitoring.  Laboratory results. TREATMENT  First, knowing the cause of the hyperglycemia is important before the hyperglycemia can be treated. Treatment may include, but is not be limited to:  Education.  Change or adjustment in medications.  Change or adjustment in meal plan.  Treatment for an illness, infection, etc.  More frequent blood glucose monitoring.  Change in exercise plan.  Decreasing or stopping steroids.  Lifestyle changes. HOME CARE INSTRUCTIONS   Test your blood glucose as directed.  Exercise regularly. Your caregiver will give you instructions about exercise. Pre-diabetes or diabetes which comes on with stress is helped by exercising.  Eat wholesome, balanced meals. Eat often and at regular, fixed times. Your caregiver or nutritionist will give you a meal plan to guide your sugar intake.  Being at an ideal weight is important. If needed, losing as little as 10 to 15 pounds may help improve blood glucose levels. SEEK MEDICAL CARE IF:   You have questions about medicine, activity, or diet.  You continue to have symptoms (problems such as increased thirst, urination, or weight gain). SEEK IMMEDIATE MEDICAL CARE IF:  You are vomiting or have diarrhea.  Your breath smells fruity.  You are breathing faster or slower.  You are very sleepy or incoherent.  You have numbness, tingling, or pain in your feet or hands.  You have chest pain.  Your symptoms get worse even though you have been following your caregiver's orders.  If you have any other questions or concerns. Document Released: 03/03/2001 Document Revised: 11/30/2011 Document Reviewed: 01/04/2012 [facility] Patient Information  2015 [place], [place]. This information is not intended to replace advice given to you by your health care provider. Make sure you discuss any questions you have with your health care provider.  Vaginitis Vaginitis is an inflammation of the vagina. It is most often caused by a change in the normal balance of the bacteria and yeast that live in the vagina. This change in balance causes an overgrowth of certain bacteria or yeast, which causes the inflammation. There are different types of vaginitis, but the most common types are:  Bacterial vaginosis.  Yeast infection (candidiasis).  Trichomoniasis vaginitis. This is a sexually transmitted infection (STI).  Viral vaginitis.  Atropic vaginitis.  Allergic vaginitis. CAUSES  The cause depends on the type of vaginitis. Vaginitis can be caused by:  Bacteria (bacterial vaginosis).  Yeast (yeast infection).  A parasite (trichomoniasis vaginitis)  A virus (viral vaginitis).  Low hormone levels (atrophic vaginitis). Low hormone levels can occur during pregnancy, breastfeeding, or after menopause.  Irritants, such as bubble baths, scented tampons, and feminine sprays (allergic vaginitis). Other factors can change the normal balance of the yeast and bacteria that live in the vagina. These include:  Antibiotic medicines.  Poor hygiene.  Diaphragms, vaginal sponges, spermicides, birth control pills, and intrauterine devices (IUD).  Sexual intercourse.  Infection.  Uncontrolled diabetes.  A weakened immune system. SYMPTOMS  Symptoms can vary depending on the cause of the vaginitis. Common symptoms include:  Abnormal vaginal discharge.  The discharge is white, gray, or yellow with bacterial vaginosis.  The discharge is thick, white, and cheesy with a yeast infection.  The discharge is frothy and yellow or greenish with trichomoniasis.  A bad vaginal odor.  The odor is fishy with bacterial vaginosis.  Vaginal itching, pain, or  swelling.  Painful intercourse.  Pain or burning when urinating. Sometimes, there are no symptoms. TREATMENT  Treatment will vary depending on the type of infection.   Bacterial vaginosis and trichomoniasis are often treated with antibiotic creams or pills.  Yeast infections are often treated with antifungal medicines, such as vaginal creams or suppositories.  Viral vaginitis has no cure, but symptoms can be treated with medicines that relieve discomfort. Your sexual partner should be treated as well.  Atrophic vaginitis may be treated with an estrogen cream, pill, suppository, or vaginal ring. If vaginal dryness occurs, lubricants and moisturizing creams may help. You may be told to avoid scented soaps, sprays, or douches.  Allergic vaginitis treatment involves quitting the use of the product that is causing the problem. Vaginal creams can be used to treat the symptoms. HOME CARE INSTRUCTIONS   Take all medicines as directed by your caregiver.  Keep your genital area clean and dry. Avoid soap and only rinse the area with water.  Avoid douching. It can remove the healthy bacteria in the vagina.  Do not use tampons or have sexual intercourse until your vaginitis has been treated. Use sanitary pads while you have vaginitis.  Wipe from front to back. This avoids the spread of bacteria from the rectum to  the vagina.  Let air reach your genital area.  Wear cotton underwear to decrease moisture buildup.  Avoid wearing underwear while you sleep until your vaginitis is gone.  Avoid tight pants and underwear or nylons without a cotton panel.  Take off wet clothing (especially bathing suits) as soon as possible.  Use mild, non-scented products. Avoid using irritants, such as:  Scented feminine sprays.  Fabric softeners.  Scented detergents.  Scented tampons.  Scented soaps or bubble baths.  Practice safe sex and use condoms. Condoms may prevent the spread of trichomoniasis  and viral vaginitis. SEEK MEDICAL CARE IF:   You have abdominal pain.  You have a fever or persistent symptoms for more than 2-3 days.  You have a fever and your symptoms suddenly get worse. Document Released: 07/05/2007 Document Revised: 06/01/2012 Document Reviewed: 02/18/2012 [facility] Patient Information 2015 [place], [place]. This information is not intended to replace advice given to you by your health care provider. Make sure you discuss any questions you have with your health care provider.

## 2015-01-20 NOTE — ED Provider Notes (Signed)
CSN: [id number]     Arrival date & time 01/20/15  1459 History   First MD Initiated Contact with Patient 01/20/15 1539     Chief Complaint  Patient presents with  . Vaginal Discharge   (Consider location/radiation/quality/duration/timing/severity/associated sxs/prior Treatment) HPI Comments: Patient reports 1-2 weeks of vaginal discharge with associated vaginal and anal irritation. Moderate to severe vaginal pruritis. Symptoms began 1-2 weeks ago and patient reports has tried multiple over the counter yeast vaginitis preparations with no improvement. Also mentions that she has been out of her insulin (Lantus) for about one month.  PCP; [facility] Denies concerns for STIs LNMP: Has IUD  Patient is a 29 y.o. female presenting with vaginal discharge. The history is provided by the patient.  Vaginal Discharge Quality:  White   Past Medical History  Diagnosis Date  . Hypertension   . Nephrolithiasis   . Hepatic steatosis 01/17/2012  . Cholelithiasis 01/17/2012  . Headache(784.0)   . Bacteremia associated with IV line 05/01/2013    Port-a-cath infection, removed 8/6 by IR. Ecoli grew out in cultures. Sent home with Ceftriaxone to complete 14 day course.   . Bacteremia due to Escherichia coli 05/01/2013    Port-a-cath infection, removed 8/6 by IR. Ecoli grew out in cultures. Sent home with Ceftriaxone to complete 14 day course.   . 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 Leukemia, acute monocytic, in remission   . Diabetes mellitus     type 2, insulin   Past Surgical History  Procedure Laterality Date  . Portacath placement      [facility]  . Cholecystectomy  01/31/2012    Procedure: LAPAROSCOPIC CHOLECYSTECTOMY WITH INTRAOPERATIVE CHOLANGIOGRAM;  Surgeon: Joyice Faster. Cornett, MD;  Location: [facility];  Service: General;  Laterality: N/A;   Family History  Problem Relation Age of Onset  . Kidney disease Mother   . Hypertension Mother   . Epilepsy Mother   . Sleep apnea Mother   .  Kidney disease Maternal Grandmother   . Diabetes Maternal Grandmother   . Diabetes Father    History  Substance Use Topics  . Smoking status: Never Smoker   . Smokeless tobacco: Never Used  . Alcohol Use: No   OB History    Gravida Para Term Preterm AB TAB SAB Ectopic Multiple Living   '2 2 2 ' 0 0 0 0 0 0 2     Review of Systems  Genitourinary: Positive for vaginal discharge.  All other systems reviewed and are negative.   Allergies  Ultram  Home Medications   Prior to Admission medications   Medication Sig Start Date End Date Taking? Authorizing Provider  ACCU-CHEK FASTCLIX LANCETS MISC 1 each by Does not apply route 3 (three) times daily. Check sugar 3x daily 06/07/14   Renee A Kuneff, DO  Blood Glucose Monitoring Suppl (ACCU-CHEK NANO SMARTVIEW) W/DEVICE KIT Check blood glucose x3 daily. Must take morning fasting blood glucose 06/07/14   Renee A Kuneff, DO  cyclobenzaprine (FLEXERIL) 5 MG tablet Take 5 mg by mouth daily as needed for muscle spasms.    Historical Provider, MD  fluconazole (DIFLUCAN) 150 MG tablet Take 1 tablet (150 mg total) by mouth daily. X 2 days 01/20/15   Audelia Hives Presson, PA  gabapentin (NEURONTIN) 100 MG capsule Take 1 capsule (100 mg total) by mouth 3 (three) times daily. 06/19/14   Renee A Kuneff, DO  glucose blood (ACCU-CHEK SMARTVIEW) test strip Check sugar 3 x daily 06/07/14  Renee A Kuneff, DO  ibuprofen (ADVIL,MOTRIN) 600 MG tablet Take 1 tablet (600 mg total) by mouth every 6 (six) hours as needed. 10/12/14   Charlesetta Shanks, MD  insulin detemir (LEVEMIR) 100 UNIT/ML injection Inject 0.1 mLs (10 Units total) into the skin at bedtime. 01/20/15   Audelia Hives Presson, PA  Insulin Syringe-Needle U-100 (INSULIN SYRINGE 1CC/31GX5/16") 31G X 5/16" 1 ML MISC Use as directed to inject insulin. 01/20/15   Audelia Hives Presson, PA  lisinopril (PRINIVIL,ZESTRIL) 5 MG tablet Take 1 tablet (5 mg total) by mouth daily. 06/19/14   Renee A Kuneff, DO  metFORMIN  (GLUCOPHAGE) 1000 MG tablet Take 1,000 mg by mouth 2 (two) times daily with a meal.    Historical Provider, MD  metroNIDAZOLE (METROGEL VAGINAL) 0.75 % vaginal gel Place 1 Applicatorful vaginally 2 (two) times daily. 01/20/15   Audelia Hives Presson, PA  QUEtiapine (SEROQUEL) 50 MG tablet Take 100 mg by mouth at bedtime.    Historical Provider, MD  terconazole (TERAZOL 7) 0.4 % vaginal cream Place 1 applicator vaginally at bedtime as needed (for vaginal).    Historical Provider, MD   BP 154/108 mmHg  Pulse 93  Temp(Src) 99 F (37.2 C) (Oral)  Resp 20  SpO2 98%  LMP 01/04/2015 Physical Exam  Constitutional: She is oriented to person, place, and time. She appears well-developed and well-nourished. No distress.  +obese  HENT:  Head: Normocephalic and atraumatic.  Eyes: Conjunctivae are normal.  Cardiovascular: Normal rate, regular rhythm and normal heart sounds.   Pulmonary/Chest: Effort normal and breath sounds normal.  Abdominal: Soft. Bowel sounds are normal. She exhibits no distension. There is no tenderness.  Genitourinary: Uterus normal.    Pelvic exam was performed with patient supine. There is rash and tenderness on the right labia. There is no lesion on the right labia. There is rash and tenderness on the left labia. There is no lesion on the left labia. Cervix exhibits no motion tenderness, no discharge and no friability. Right adnexum displays no mass, no tenderness and no fullness. Left adnexum displays no mass, no tenderness and no fullness. There is erythema in the vagina. No tenderness or bleeding in the vagina. No foreign body around the vagina. No signs of injury around the vagina. Vaginal discharge found.  Localized erythema, irritation and superficial excoriation.  Mild amount of thick white discharge  Musculoskeletal: Normal range of motion.  Neurological: She is alert and oriented to person, place, and time.  Skin: Skin is warm and dry.  Psychiatric: She has a normal  mood and affect. Her behavior is normal.  Nursing note and vitals reviewed.   ED Course  Procedures (including critical care time) Labs Review Labs Reviewed  POCT URINALYSIS DIP (DEVICE) - Abnormal; Notable for the following:    Glucose, UA >=1000 (*)    Ketones, ur 15 (*)    All other components within normal limits  POCT I-STAT, CHEM 8 - Abnormal; Notable for the following:    Glucose, Bld 366 (*)    All other components within normal limits  POCT PREGNANCY, URINE  CERVICOVAGINAL ANCILLARY ONLY    Imaging Review No results found.   MDM   1. Vaginitis and vulvovaginitis   2. Hyperglycemia    UPT negative UA with mild ketonuria and glucosuria. No positive indicators of infection.  Istat8: consistent with hyperglycemia (glucose 366) and otherwise normal. No indication of DKA.  Cervicovaginal testing pending Patient declined empiric STI treatment Will treat vaginitis  with oral diflucan and metrogel vaginal and advise follow up with PCP or ObGyn if no improvement Patient also given a new Rx for Levemir as Lantus is not covered under her current prescription coverage and advised to begin using as soon as possible.     Lutricia Feil, [place] 01/20/15 [phone]

## 2015-01-20 NOTE — ED Notes (Signed)
Vaginal discharge. Pt having vaginal/rectal itching.  Pt was treated with fluconozole and monistat with no relief.AUpright,RMA

## 2015-01-21 LAB — CERVICOVAGINAL ANCILLARY ONLY
CHLAMYDIA, DNA PROBE: NEGATIVE
NEISSERIA GONORRHEA: NEGATIVE
WET PREP (BD AFFIRM): POSITIVE — AB

## 2015-01-22 NOTE — ED Notes (Signed)
GC/Chlamydia neg., Gardnerella pos. Pt. adequately treated with Metrogel. Roselyn Meier 01/22/2015

## 2015-02-19 ENCOUNTER — Emergency Department (INDEPENDENT_AMBULATORY_CARE_PROVIDER_SITE_OTHER)
Admission: EM | Admit: 2015-02-19 | Discharge: 2015-02-19 | Disposition: A | Discharge status: Home / Self Care | Payer: Medicare Other | Source: Home / Self Care | Attending: Family Medicine | Admitting: Family Medicine

## 2015-02-19 ENCOUNTER — Encounter (HOSPITAL_COMMUNITY): Payer: Self-pay | Admitting: Emergency Medicine

## 2015-02-19 DIAGNOSIS — G5791 Unspecified mononeuropathy of right lower limb: Secondary | ICD-10-CM

## 2015-02-19 DIAGNOSIS — M792 Neuralgia and neuritis, unspecified: Secondary | ICD-10-CM

## 2015-02-19 MED ORDER — DICLOFENAC POTASSIUM 50 MG PO TABS: 50.0000 mg | ORAL_TABLET | Freq: Three times a day (TID) | ORAL | Status: DC

## 2015-02-19 NOTE — ED Provider Notes (Signed)
CSN: [id number]     Arrival date & time 02/19/15  0803 History   First MD Initiated Contact with Patient 02/19/15 [phone]     Chief Complaint  Patient presents with  . Leg Pain   (Consider location/radiation/quality/duration/timing/severity/associated sxs/prior Treatment) Patient is a 29 y.o. female presenting with leg pain. The history is provided by the patient.  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   Past Medical History  Diagnosis Date  . Hypertension   . Nephrolithiasis   . Hepatic steatosis 01/17/2012  . Cholelithiasis 01/17/2012  . Headache(784.0)   . Bacteremia associated with IV line 05/01/2013    Port-a-cath infection, removed 8/6 by IR. Ecoli grew out in cultures. Sent home with Ceftriaxone to complete 14 day course.   . Bacteremia due to Escherichia coli 05/01/2013    Port-a-cath infection, removed 8/6 by IR. Ecoli grew out in cultures. Sent home with Ceftriaxone to complete 14 day course.   . 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 Leukemia, acute monocytic, in remission   . Diabetes mellitus     type 2, insulin   Past Surgical History  Procedure Laterality Date  . Portacath placement      [facility]  . Cholecystectomy  01/31/2012    Procedure: LAPAROSCOPIC CHOLECYSTECTOMY WITH INTRAOPERATIVE CHOLANGIOGRAM;  Surgeon: Joyice Faster. Cornett, MD;  Location: [facility];  Service: General;  Laterality: N/A;   Family History   Problem Relation Age of Onset  . Kidney disease Mother   . Hypertension Mother   . Epilepsy Mother   . Sleep apnea Mother   . Kidney disease Maternal Grandmother   . Diabetes Maternal Grandmother   . Diabetes Father    History  Substance Use Topics  . Smoking status: Never Smoker   . Smokeless tobacco: Never Used  . Alcohol Use: No   OB History    Gravida Para Term Preterm AB TAB SAB Ectopic Multiple Living   _0 0 0 0 0 0 0 2     Review of Systems  Constitutional: Negative.   Gastrointestinal: Negative.   Genitourinary: Negative.   Musculoskeletal: Negative.  Negative for back pain, joint swelling and gait problem.  Skin: Negative.     Allergies  Ultram  Home Medications   Prior to Admission medications   Medication Sig Start Date End Date Taking? Authorizing Provider  ACCU-CHEK FASTCLIX LANCETS MISC 1 each by Does not apply route 3 (three) times daily. Check sugar 3x daily 06/07/14   Renee A Kuneff, DO  Blood Glucose Monitoring Suppl (ACCU-CHEK NANO SMARTVIEW) W/DEVICE KIT Check blood glucose x3 daily. Must take morning fasting blood glucose 06/07/14   Renee A Kuneff, DO  cyclobenzaprine (FLEXERIL) 5 MG tablet Take 5 mg by mouth daily as needed for muscle spasms.    Historical Provider, MD  diclofenac (CATAFLAM) 50 MG  tablet Take 1 tablet (50 mg total) by mouth 3 (three) times daily. 02/19/15   Billy Fischer, MD  fluconazole (DIFLUCAN) 150 MG tablet Take 1 tablet (150 mg total) by mouth daily. X 2 days 01/20/15   Audelia Hives Presson, PA  gabapentin (NEURONTIN) 100 MG capsule Take 1 capsule (100 mg total) by mouth 3 (three) times daily. 06/19/14   Renee A Kuneff, DO  glucose blood (ACCU-CHEK SMARTVIEW) test strip Check sugar 3 x daily 06/07/14   Renee A Kuneff, DO  ibuprofen (ADVIL,MOTRIN) 600 MG tablet Take 1 tablet (600 mg total) by mouth every 6 (six) hours as needed. 10/12/14   Charlesetta Shanks, MD  insulin detemir (LEVEMIR) 100 UNIT/ML injection Inject 0.1 mLs (10 Units  total) into the skin at bedtime. 01/20/15   Audelia Hives Presson, PA  Insulin Syringe-Needle U-100 (INSULIN SYRINGE 1CC/31GX5/16") 31G X 5/16" 1 ML MISC Use as directed to inject insulin. 01/20/15   Audelia Hives Presson, PA  lisinopril (PRINIVIL,ZESTRIL) 5 MG tablet Take 1 tablet (5 mg total) by mouth daily. 06/19/14   Renee A Kuneff, DO  metFORMIN (GLUCOPHAGE) 1000 MG tablet Take 1,000 mg by mouth 2 (two) times daily with a meal.    Historical Provider, MD  metroNIDAZOLE (METROGEL VAGINAL) 0.75 % vaginal gel Place 1 Applicatorful vaginally 2 (two) times daily. 01/20/15   Audelia Hives Presson, PA  QUEtiapine (SEROQUEL) 50 MG tablet Take 100 mg by mouth at bedtime.    Historical Provider, MD  terconazole (TERAZOL 7) 0.4 % vaginal cream Place 1 applicator vaginally at bedtime as needed (for vaginal).    Historical Provider, MD   BP 127/76 mmHg  Pulse 86  Temp(Src) 99.6 F (37.6 C) (Oral)  Resp 16  SpO2 100%  LMP 01/04/2015 Physical Exam  Constitutional: She is oriented to person, place, and time. She appears well-developed and well-nourished.  Abdominal: Soft. Bowel sounds are normal.  Musculoskeletal: Normal range of motion.  Lat thigh paresthesias, distal nvt intact, neg slr, nl ehl strength and sens, pulses 2+.  Lymphadenopathy:    She has no cervical adenopathy.  Neurological: She is alert and oriented to person, place, and time.  Skin: Skin is warm and dry.  Nursing note and vitals reviewed.   ED Course  Procedures (including critical care time) Labs Review Labs Reviewed - No data to display  Imaging Review No results found.   MDM   1. Leg neuralgia, right       Billy Fischer, MD 02/19/15 [phone]

## 2015-02-19 NOTE — Discharge Instructions (Signed)
Use medicine and reduce sitting, more walking, see orthopedist if further problems

## 2015-02-19 NOTE — ED Notes (Signed)
Patient c/o right leg pain onset Sunday. Patient denies any known injury. Patient reports pain is mostly around her thigh. Patient is in NAD.

## 2015-02-25 ENCOUNTER — Telehealth: Payer: Self-pay | Admitting: Hematology and Oncology

## 2015-02-25 NOTE — Telephone Encounter (Signed)
This message was given to Dr Alvy Bimler RN.

## 2015-02-25 NOTE — Telephone Encounter (Signed)
Patient left message on voicemail stating her hair is falling out and requesting appointment with Dr. Alvy Bimler. Per patient she has not been seen in a while and wants to make sure everything is ok.

## 2015-02-26 ENCOUNTER — Other Ambulatory Visit: Payer: Self-pay | Admitting: Hematology and Oncology

## 2015-02-26 ENCOUNTER — Telehealth: Payer: Self-pay | Admitting: Hematology and Oncology

## 2015-02-26 DIAGNOSIS — Z6841 Body Mass Index (BMI) 40.0 and over, adult: Principal | ICD-10-CM

## 2015-02-26 DIAGNOSIS — R5383 Other fatigue: Secondary | ICD-10-CM

## 2015-02-26 NOTE — Telephone Encounter (Signed)
Number not connected. Mailed calendar for July.

## 2015-03-05 ENCOUNTER — Ambulatory Visit (INDEPENDENT_AMBULATORY_CARE_PROVIDER_SITE_OTHER): Payer: Medicare Other | Admitting: Family Medicine

## 2015-03-05 ENCOUNTER — Encounter: Payer: Self-pay | Admitting: Family Medicine

## 2015-03-05 VITALS — BP 138/85 | HR 89 | Temp 98.3°F | Ht 62.0 in | Wt 274.2 lb

## 2015-03-05 DIAGNOSIS — N760 Acute vaginitis: Secondary | ICD-10-CM

## 2015-03-05 MED ORDER — TERCONAZOLE 0.4 % VA CREA: 1.0000 | TOPICAL_CREAM | Freq: Every evening | VAGINAL | Status: DC | PRN

## 2015-03-05 MED ORDER — TRIAMCINOLONE ACETONIDE 0.1 % EX CREA: 1.0000 "application " | TOPICAL_CREAM | Freq: Two times a day (BID) | CUTANEOUS | Status: DC

## 2015-03-05 MED ORDER — FLUCONAZOLE 150 MG PO TABS: 150.0000 mg | ORAL_TABLET | Freq: Every day | ORAL | Status: DC

## 2015-03-05 NOTE — Progress Notes (Signed)
Patient ID: Melissa Washington, female   DOB: [birth year], 29 y.o.   MRN: [id number] Subjective:   CC: Recurrent yeast vaginitis/vaginal pruritis   HPI:   Patient presents to sameday clinic complaining of vaginal itching x 1 week. She has also had thick chalky white discharge during that time. Denies dysuria, vaginal pain, dyspareunia, fevers, abdominal or back pain. Sexually active with 1 female partner with no condoms. Has IUD in place. Scratching so has had some bleeding and irritation around vagina. No sores or ulcers. Has tried a cream rx'ed by [place] [address] in the past that has not helped. No recent antibiotics. Missed menstrual period: Does not have any periods with IUD.  Review of Systems - Per HPI.   PMH - DM II, HTN, morbid obesity, acute promyeolcytic leukemia in remission, allergic rhinitis, IUD complication, exposure to STD in the past, anxiety and depression, alopecia    Objective:  Physical Exam BP 138/85 mmHg  Pulse 89  Temp(Src) 98.3 F (36.8 C) (Oral)  Ht 5\' 2"  (1.575 m)  Wt 274 lb 3.2 oz (124.376 kg)  BMI 50.14 kg/m2 GEN: NAD, pleasant ABD: S/NT/ND GU: normal introitus.  + excoriations along the medial groin region

## 2015-03-05 NOTE — Patient Instructions (Signed)
Fluconazole tablets What is this medicine? FLUCONAZOLE (floo KON na zole) is an antifungal medicine. It is used to treat certain kinds of fungal or yeast infections. This medicine may be used for other purposes; ask your health care provider or pharmacist if you have questions. COMMON BRAND NAME(S): Diflucan What should I tell my health care provider before I take this medicine? They need to know if you have any of these conditions: -electrolyte abnormalities -history of irregular heart beat -kidney disease -an unusual or allergic reaction to fluconazole, other azole antifungals, medicines, foods, dyes, or preservatives -pregnant or trying to get pregnant -breast-feeding How should I use this medicine? Take this medicine by mouth. Follow the directions on the prescription label. Do not take your medicine more often than directed. Talk to your pediatrician regarding the use of this medicine in children. Special care may be needed. This medicine has been used in children as young as 36 months of age. Overdosage: If you think you have taken too much of this medicine contact a poison control center or emergency room at once. NOTE: This medicine is only for you. Do not share this medicine with others. What if I miss a dose? If you miss a dose, take it as soon as you can. If it is almost time for your next dose, take only that dose. Do not take double or extra doses. What may interact with this medicine? Do not take this medicine with any of the following medications: -astemizole -certain medicines for irregular heart beat like dofetilide, dronedarone, quinidine -cisapride -erythromycin -lomitapide -other medicines that prolong the QT interval (cause an abnormal heart rhythm) -pimozide -terfenadine -thioridazine -tolvaptan -ziprasidone This medicine may also interact with the following medications: -antiviral medicines for HIV or AIDS -birth control pills -certain antibiotics like  rifabutin, rifampin -certain medicines for blood pressure like amlodipine, isradipine, felodipine, hydrochlorothiazide, losartan, nifedipine -certain medicines for cancer like cyclophosphamide, vinblastine, vincristine -certain medicines for cholesterol like atorvastatin, lovastatin, fluvastatin, simvastatin -certain medicines for depression, anxiety, or psychotic disturbances like amitriptyline, midazolam, nortriptyline, triazolam -certain medicines for diabetes like glipizide, glyburide, tolbutamide -certain medicines for pain like alfentanil, fentanyl, methadone -certain medicines for seizures like carbamazepine, phenytoin -certain medicines that treat or prevent blood clots like warfarin -halofantrine -medicines that lower your chance of fighting infection like cyclosporine, prednisone, tacrolimus -NSAIDS, medicines for pain and inflammation, like celecoxib, diclofenac, flurbiprofen, ibuprofen, meloxicam, naproxen -other medicines for fungal infections -sirolimus -theophylline -tofacitinib This list may not describe all possible interactions. Give your health care provider a list of all the medicines, herbs, non-prescription drugs, or dietary supplements you use. Also tell them if you smoke, drink alcohol, or use illegal drugs. Some items may interact with your medicine. What should I watch for while using this medicine? Visit your doctor or health care professional for regular checkups. If you are taking this medicine for a long time you may need blood work. Tell your doctor if your symptoms do not improve. Some fungal infections need many weeks or months of treatment to cure. Alcohol can increase possible damage to your liver. Avoid alcoholic drinks. If you have a vaginal infection, do not have sex until you have finished your treatment. You can wear a sanitary napkin. Do not use tampons. Wear freshly washed cotton, not synthetic, panties. What side effects may I notice from receiving this  medicine? Side effects that you should report to your doctor or health care professional as soon as possible: -allergic reactions like skin rash or itching, hives, swelling  of the lips, mouth, tongue, or throat -dark urine -feeling dizzy or faint -irregular heartbeat or chest pain -redness, blistering, peeling or loosening of the skin, including inside the mouth -trouble breathing -unusual bruising or bleeding -vomiting -yellowing of the eyes or skin Side effects that usually do not require medical attention (report to your doctor or health care professional if they continue or are bothersome): -changes in how food tastes -diarrhea -headache -stomach upset or nausea This list may not describe all possible side effects. Call your doctor for medical advice about side effects. You may report side effects to FDA at 1-800-FDA-1088. Where should I keep my medicine? Keep out of the reach of children. Store at room temperature below 30 degrees C (86 degrees F). Throw away any medicine after the expiration date. NOTE: This sheet is a summary. It may not cover all possible information. If you have questions about this medicine, talk to your doctor, pharmacist, or health care provider.  2015, Elsevier/Gold Standard. (2013-04-15 16:13:04)

## 2015-03-05 NOTE — Assessment & Plan Note (Addendum)
Diflucan 150 mg PO with terazol intravaginal.  For the external vaginal pruritis, will have a trial of kenalog cream BID for possible dermatitis vs Lichen sclerosus.   If no improvement would consider nystatin powder or similar.   - Could consider bx of area if persistent issue to r/o more pressing issues

## 2015-03-18 ENCOUNTER — Other Ambulatory Visit: Payer: Self-pay

## 2015-03-24 ENCOUNTER — Emergency Department (HOSPITAL_COMMUNITY)
Admission: EM | Admit: 2015-03-24 | Discharge: 2015-03-24 | Disposition: A | Discharge status: Home / Self Care | Payer: Medicare Other | Attending: Emergency Medicine | Admitting: Emergency Medicine

## 2015-03-24 ENCOUNTER — Encounter (HOSPITAL_COMMUNITY): Payer: Self-pay | Admitting: Emergency Medicine

## 2015-03-24 DIAGNOSIS — Z8719 Personal history of other diseases of the digestive system: Secondary | ICD-10-CM | POA: Insufficient documentation

## 2015-03-24 DIAGNOSIS — Z3202 Encounter for pregnancy test, result negative: Secondary | ICD-10-CM | POA: Diagnosis not present

## 2015-03-24 DIAGNOSIS — Z7952 Long term (current) use of systemic steroids: Secondary | ICD-10-CM | POA: Diagnosis not present

## 2015-03-24 DIAGNOSIS — R52 Pain, unspecified: Secondary | ICD-10-CM | POA: Diagnosis present

## 2015-03-24 DIAGNOSIS — Z856 Personal history of leukemia: Secondary | ICD-10-CM | POA: Insufficient documentation

## 2015-03-24 DIAGNOSIS — I10 Essential (primary) hypertension: Secondary | ICD-10-CM | POA: Diagnosis not present

## 2015-03-24 DIAGNOSIS — Z87442 Personal history of urinary calculi: Secondary | ICD-10-CM | POA: Insufficient documentation

## 2015-03-24 DIAGNOSIS — Z794 Long term (current) use of insulin: Secondary | ICD-10-CM | POA: Diagnosis not present

## 2015-03-24 DIAGNOSIS — E1165 Type 2 diabetes mellitus with hyperglycemia: Secondary | ICD-10-CM | POA: Diagnosis not present

## 2015-03-24 DIAGNOSIS — Z79899 Other long term (current) drug therapy: Secondary | ICD-10-CM | POA: Diagnosis not present

## 2015-03-24 DIAGNOSIS — M545 Low back pain: Secondary | ICD-10-CM | POA: Diagnosis not present

## 2015-03-24 LAB — COMPREHENSIVE METABOLIC PANEL
ALT: 30 U/L (ref 14–54)
ANION GAP: 10 (ref 5–15)
AST: 45 U/L — ABNORMAL HIGH (ref 15–41)
Albumin: 4 g/dL (ref 3.5–5.0)
Alkaline Phosphatase: 66 U/L (ref 38–126)
BILIRUBIN TOTAL: 1.6 mg/dL — AB (ref 0.3–1.2)
BUN: 12 mg/dL (ref 6–20)
CALCIUM: 8.7 mg/dL — AB (ref 8.9–10.3)
CHLORIDE: 97 mmol/L — AB (ref 101–111)
CO2: 26 mmol/L (ref 22–32)
Creatinine, Ser: 0.92 mg/dL (ref 0.44–1.00)
GFR calc Af Amer: >60 mL/min (ref >60)
GFR calc non Af Amer: >60 mL/min (ref >60)
Glucose, Bld: 297 mg/dL — ABNORMAL HIGH (ref 65–99)
Potassium: 4.5 mmol/L (ref 3.5–5.1)
SODIUM: 133 mmol/L — AB (ref 135–145)
Total Protein: 6.2 g/dL — ABNORMAL LOW (ref 6.5–8.1)

## 2015-03-24 LAB — URINE MICROSCOPIC-ADD ON

## 2015-03-24 LAB — URINALYSIS, ROUTINE W REFLEX MICROSCOPIC
BILIRUBIN URINE: NEGATIVE
Hgb urine dipstick: NEGATIVE
Ketones, ur: NEGATIVE mg/dL
NITRITE: NEGATIVE
PH: 6 (ref 5.0–8.0)
Protein, ur: NEGATIVE mg/dL
Specific Gravity, Urine: 1.018 (ref 1.005–1.030)
Urobilinogen, UA: 1 mg/dL (ref 0.0–1.0)

## 2015-03-24 LAB — CBC WITH DIFFERENTIAL/PLATELET
Basophils Absolute: 0 10*3/uL (ref 0.0–0.1)
Basophils Relative: 0 % (ref 0–1)
Eosinophils Absolute: 0.1 10*3/uL (ref 0.0–0.7)
Eosinophils Relative: 2 % (ref 0–5)
HCT: 36.6 % (ref 36.0–46.0)
Hemoglobin: 12.8 g/dL (ref 12.0–15.0)
Lymphocytes Relative: 39 % (ref 12–46)
Lymphs Abs: 2.9 10*3/uL (ref 0.7–4.0)
MCH: 29 pg (ref 26.0–34.0)
MCHC: 35 g/dL (ref 30.0–36.0)
MCV: 83 fL (ref 78.0–100.0)
MONOS PCT: 4 % (ref 3–12)
Monocytes Absolute: 0.3 10*3/uL (ref 0.1–1.0)
Neutro Abs: 4 10*3/uL (ref 1.7–7.7)
Neutrophils Relative %: 55 % (ref 43–77)
Platelets: 232 10*3/uL (ref 150–400)
RBC: 4.41 MIL/uL (ref 3.87–5.11)
RDW: 13.1 % (ref 11.5–15.5)
WBC: 7.4 10*3/uL (ref 4.0–10.5)

## 2015-03-24 LAB — POC URINE PREG, ED: Preg Test, Ur: NEGATIVE

## 2015-03-24 LAB — CBG MONITORING, ED
GLUCOSE-CAPILLARY: 270 mg/dL — AB (ref 65–99)
Glucose-Capillary: 280 mg/dL — ABNORMAL HIGH (ref 65–99)

## 2015-03-24 MED ORDER — CLOTRIMAZOLE 1 % EX CREA: TOPICAL_CREAM | CUTANEOUS | Status: DC

## 2015-03-24 MED ORDER — SODIUM CHLORIDE 0.9 % IV BOLUS (SEPSIS)
1000.0000 mL | Freq: Once | INTRAVENOUS | Status: AC
  Administered 2015-03-24: 1000 mL via INTRAVENOUS

## 2015-03-24 MED ORDER — KETOROLAC TROMETHAMINE 30 MG/ML IJ SOLN
30.0000 mg | Freq: Once | INTRAMUSCULAR | Status: AC
  Administered 2015-03-24: 30 mg via INTRAVENOUS
  Filled 2015-03-24: qty 1

## 2015-03-24 NOTE — ED Notes (Signed)
Husband states they need to leave to go get kids and are unable to wait.  Pt had questioned "1 hour wait" on sign when they arrived and was informed that wait was atleast 1 hour.  Encouraged them to stay and informed them that there are several discharges and she is the only person waiting now.

## 2015-03-24 NOTE — ED Notes (Signed)
PT REQUESTED WATER AND WAS GIVEN THE SAME OK PER PA [place]

## 2015-03-24 NOTE — ED Notes (Signed)
CBG CHECKED 270 RN GABE NOTIFIED

## 2015-03-24 NOTE — ED Notes (Signed)
CHECKED CBG 280-RN GABE NOTIFIED

## 2015-03-24 NOTE — ED Provider Notes (Signed)
CSN: [id number]     Arrival date & time 03/24/15  1947 History   First MD Initiated Contact with Patient 03/24/15 2037     Chief Complaint  Patient presents with  . Generalized Body Aches     (Consider location/radiation/quality/duration/timing/severity/associated sxs/prior Treatment) HPI  Melissa Washington is a 29 y.o. female with PMH of HTN, DM presenting with generalized body aches worse in bilateral upper thighs described as an ache and worse with movement. No improvement with ibuprofen. She denies numbness, tingling, weakness. No injury or fall. No fevers, chills, night sweats, weight loss. No loss of control of bladder or bowel. No saddle anesthesia. No chest pain, shortness of breath. No abdominal pain or urinary symptoms. No nausea, vomiting, diarrhea. Pt reports taking metformin but denies taking insulin due to financial issues.   Past Medical History  Diagnosis Date  . Hypertension   . Nephrolithiasis   . Hepatic steatosis 01/17/2012  . Cholelithiasis 01/17/2012  . Headache(784.0)   . Bacteremia associated with IV line 05/01/2013    Port-a-cath infection, removed 8/6 by IR. Ecoli grew out in cultures. Sent home with Ceftriaxone to complete 14 day course.   . Bacteremia due to Escherichia coli 05/01/2013    Port-a-cath infection, removed 8/6 by IR. Ecoli grew out in cultures. Sent home with Ceftriaxone to complete 14 day course.   . 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 Leukemia, acute monocytic, in remission   . Diabetes mellitus     type 2, insulin   Past Surgical History  Procedure Laterality Date  . Portacath placement      [facility]  . Cholecystectomy  01/31/2012    Procedure: LAPAROSCOPIC CHOLECYSTECTOMY WITH INTRAOPERATIVE CHOLANGIOGRAM;  Surgeon: Joyice Faster. Cornett, MD;  Location: [place] [facility];  Service: General;  Laterality: N/A;   Family History  Problem Relation Age of Onset  . Kidney disease Mother   . Hypertension Mother   . Epilepsy Mother   . Sleep  apnea Mother   . Kidney disease Maternal Grandmother   . Diabetes Maternal Grandmother   . Diabetes Father    History  Substance Use Topics  . Smoking status: Never Smoker   . Smokeless tobacco: Never Used  . Alcohol Use: No   OB History    Gravida Para Term Preterm AB TAB SAB Ectopic Multiple Living   _0 0 0 0 0 0 0 2     Review of Systems 10 Systems reviewed and are negative for acute change except as noted in the HPI.    Allergies  Ultram  Home Medications   Prior to Admission medications   Medication Sig Start Date End Date Taking? Authorizing Provider  ACCU-CHEK FASTCLIX LANCETS MISC 1 each by Does not apply route 3 (three) times daily. Check sugar 3x daily 06/07/14   Renee A Kuneff, DO  Blood Glucose Monitoring Suppl (ACCU-CHEK NANO SMARTVIEW) W/DEVICE KIT Check blood glucose x3 daily. Must take morning fasting blood glucose 06/07/14   Renee A Kuneff, DO  cyclobenzaprine (FLEXERIL) 5 MG tablet Take 5 mg by mouth daily as needed for muscle spasms.    Historical Provider, MD  diclofenac (CATAFLAM) 50 MG tablet Take 1 tablet (50 mg total) by mouth 3 (three) times daily. 02/19/15   Billy Fischer, MD  fluconazole (DIFLUCAN) 150 MG tablet Take 1 tablet (150 mg total) by mouth daily. X 2 days 03/05/15   Nolon Rod, DO  gabapentin (NEURONTIN) 100 MG capsule Take  1 capsule (100 mg total) by mouth 3 (three) times daily. 06/19/14   Renee A Kuneff, DO  glucose blood (ACCU-CHEK SMARTVIEW) test strip Check sugar 3 x daily 06/07/14   Renee A Kuneff, DO  ibuprofen (ADVIL,MOTRIN) 600 MG tablet Take 1 tablet (600 mg total) by mouth every 6 (six) hours as needed. 10/12/14   Charlesetta Shanks, MD  insulin detemir (LEVEMIR) 100 UNIT/ML injection Inject 0.1 mLs (10 Units total) into the skin at bedtime. 01/20/15   Audelia Hives Presson, PA  Insulin Syringe-Needle U-100 (INSULIN SYRINGE 1CC/31GX5/16") 31G X 5/16" 1 ML MISC Use as directed to inject insulin. 01/20/15   Audelia Hives Presson, PA   lisinopril (PRINIVIL,ZESTRIL) 5 MG tablet Take 1 tablet (5 mg total) by mouth daily. 06/19/14   Renee A Kuneff, DO  metFORMIN (GLUCOPHAGE) 1000 MG tablet Take 1,000 mg by mouth 2 (two) times daily with a meal.    Historical Provider, MD  QUEtiapine (SEROQUEL) 50 MG tablet Take 100 mg by mouth at bedtime.    Historical Provider, MD  terconazole (TERAZOL 7) 0.4 % vaginal cream Place 1 applicator vaginally at bedtime as needed. 03/05/15   Tamela Oddi Hess, DO  triamcinolone cream (KENALOG) 0.1 % Apply 1 application topically 2 (two) times daily. 03/05/15   Bryan R Hess, DO   BP 124/84 mmHg  Pulse 70  Temp(Src) 97.4 F (36.3 C) (Oral)  Resp 16  Ht _0  (1.575 m)  Wt 275 lb (124.739 kg)  BMI 50.29 kg/m2  SpO2 98%  LMP 02/21/2015 Physical Exam  Constitutional: She appears well-developed and well-nourished. No distress.  HENT:  Head: Normocephalic and atraumatic.  Eyes: Conjunctivae are normal. Right eye exhibits no discharge. Left eye exhibits no discharge.  Cardiovascular: Normal rate, regular rhythm and normal heart sounds.   Pulmonary/Chest: Effort normal and breath sounds normal. No respiratory distress. She has no wheezes.  Abdominal: Soft. Bowel sounds are normal. She exhibits no distension. There is no tenderness.  Musculoskeletal:  No midline back tenderness, step off or crepitus. Right and Left sided lower back tenderness. No CVA tenderness.  Neurological: She is alert. Coordination normal.  Equal muscle tone. 5/5 strength in lower extremities. DTR equal and intact. Negative straight leg test. Antalgic gait.  Skin: Skin is warm and dry. She is not diaphoretic.  Nursing note and vitals reviewed.   ED Course  Procedures (including critical care time) Labs Review Labs Reviewed  COMPREHENSIVE METABOLIC PANEL - Abnormal; Notable for the following:    Sodium 133 (*)    Chloride 97 (*)    Glucose, Bld 297 (*)    Calcium 8.7 (*)    Total Protein 6.2 (*)    AST 45 (*)    Total  Bilirubin 1.6 (*)    All other components within normal limits  URINALYSIS, ROUTINE W REFLEX MICROSCOPIC (NOT AT [facility]) - Abnormal; Notable for the following:    Glucose, UA >1000 (*)    Leukocytes, UA SMALL (*)    All other components within normal limits  URINE MICROSCOPIC-ADD ON - Abnormal; Notable for the following:    Squamous Epithelial / LPF MANY (*)    All other components within normal limits  CBG MONITORING, ED - Abnormal; Notable for the following:    Glucose-Capillary 270 (*)    All other components within normal limits  CBG MONITORING, ED - Abnormal; Notable for the following:    Glucose-Capillary 280 (*)    All other components within normal limits  CBC WITH DIFFERENTIAL/PLATELET  POC URINE PREG, ED    Imaging Review No results found.   EKG Interpretation None      MDM   Final diagnoses:  Hyperglycemia due to type 2 diabetes mellitus  Bilateral low back pain, with sciatica presence unspecified   Patient with bilateral low back pain without neurological symptoms. Normal neurological exam and patient ambulatory. I doubt cauda equina. Patient given Toradol in the ED. Discussed dramatic treatment and ibuprofen use. Patient also with hyper glycemia who is diabetic. Normal anion gap and no ketones in urine. Stressed importance of taking her insulin and follow up with PCP. Pt also with elevated AST and bilirubin. No abdominal symptoms or abdominal tenderness on exam. Pt afebrile. I doubt acute abdominal process. Pt to follow up with PCP for recheck. Pt nontoxic and well appearing and stable for discharge.   Discussed return precautions with patient. Discussed all results and patient verbalizes understanding and agrees with plan.  I personally performed the services described in this documentation, which was scribed in my presence. The recorded information has been reviewed and is accurate.   Al Corpus, PA-C 03/24/15 2254  Tanna Furry, MD 03/28/15 [phone]

## 2015-03-24 NOTE — Discharge Instructions (Signed)
Return to the emergency room with worsening of symptoms, new symptoms or with symptoms that are concerning , especially fevers, loss of control of bladder or bowels, numbness or tingling around genital region or anus, weakness. RICE: Rest, Ice (three cycles of 20 mins on, 44mns off at least twice a day), compression/brace, elevation. Heating pad works well for back pain. Ibuprofen 4033m(2 tablets 20076mevery 5-6 hours for 3-5 days. Follow up with PCP if symptoms worsen or are persistent. Take your insulin Please call your doctor for a followup appointment within 24-48 hours. When you talk to your doctor please let them know that you were seen in the emergency department and have them acquire all of your records so that they can discuss the findings with you and formulate a treatment plan to fully care for your new and ongoing problems.  Read below information and follow recommendations.  Back Exercises Back exercises help treat and prevent back injuries. The goal of back exercises is to increase the strength of your abdominal and back muscles and the flexibility of your back. These exercises should be started when you no longer have back pain. Back exercises include:  Pelvic Tilt. Lie on your back with your knees bent. Tilt your pelvis until the lower part of your back is against the floor. Hold this position 5 to 10 sec and repeat 5 to 10 times.  Knee to Chest. Pull first 1 knee up against your chest and hold for 20 to 30 seconds, repeat this with the other knee, and then both knees. This may be done with the other leg straight or bent, whichever feels better.  Sit-Ups or Curl-Ups. Bend your knees 90 degrees. Start with tilting your pelvis, and do a partial, slow sit-up, lifting your trunk only 30 to 45 degrees off the floor. Take at least 2 to 3 seconds for each sit-up. Do not do sit-ups with your knees out straight. If partial sit-ups are difficult, simply do the above but with only tightening  your abdominal muscles and holding it as directed.  Hip-Lift. Lie on your back with your knees flexed 90 degrees. Push down with your feet and shoulders as you raise your hips a couple inches off the floor; hold for 10 seconds, repeat 5 to 10 times.  Back arches. Lie on your stomach, propping yourself up on bent elbows. Slowly press on your hands, causing an arch in your low back. Repeat 3 to 5 times. Any initial stiffness and discomfort should lessen with repetition over time.  Shoulder-Lifts. Lie face down with arms beside your body. Keep hips and torso pressed to floor as you slowly lift your head and shoulders off the floor. Do not overdo your exercises, especially in the beginning. Exercises may cause you some mild back discomfort which lasts for a few minutes; however, if the pain is more severe, or lasts for more than 15 minutes, do not continue exercises until you see your caregiver. Improvement with exercise therapy for back problems is slow.  See your caregivers for assistance with developing a proper back exercise program. Document Released: 10/15/2004 Document Revised: 11/30/2011 Document Reviewed: 07/09/2011 [facility] Information 2015 [place] information is not intended to replace advice given to you by your health care provider. Make sure you discuss any questions you have with your health care provider.   Back Injury Prevention Back injuries can be extremely painful and difficult to heal. After having one back injury, you are much more likely to experience  another later on. It is important to learn how to avoid injuring or re-injuring your back. The following tips can help you to prevent a back injury. PHYSICAL FITNESS  Exercise regularly and try to develop good tone in your abdominal muscles. Your abdominal muscles provide a lot of the support needed by your back.  Do aerobic exercises (walking, jogging, biking, swimming) regularly.  Do exercises that  increase balance and strength (tai chi, yoga) regularly. This can decrease your risk of falling and injuring your back.  Stretch before and after exercising.  Maintain a healthy weight. The more you weigh, the more stress is placed on your back. For every pound of weight, 10 times that amount of pressure is placed on the back. DIET  Talk to your caregiver about how much calcium and vitamin D you need per day. These nutrients help to prevent weakening of the bones (osteoporosis). Osteoporosis can cause broken (fractured) bones that lead to back pain.  Include good sources of calcium in your diet, such as dairy products, green, leafy vegetables, and products with calcium added (fortified).  Include good sources of vitamin D in your diet, such as milk and foods that are fortified with vitamin D.  Consider taking a nutritional supplement or a multivitamin if needed.  Stop smoking if you smoke. POSTURE  Sit and stand up straight. Avoid leaning forward when you sit or hunching over when you stand.  Choose chairs with good low back (lumbar) support.  If you work at a desk, sit close to your work so you do not need to lean over. Keep your chin tucked in. Keep your neck drawn back and elbows bent at a right angle. Your arms should look like the letter "L."  Sit high and close to the steering wheel when you drive. Add a lumbar support to your car seat if needed.  Avoid sitting or standing in one position for too long. Take breaks to get up, stretch, and walk around at least once every hour. Take breaks if you are driving for long periods of time.  Sleep on your side with your knees slightly bent, or sleep on your back with a pillow under your knees. Do not sleep on your stomach. LIFTING, TWISTING, AND REACHING  Avoid heavy lifting, especially repetitive lifting. If you must do heavy lifting:  Stretch before lifting.  Work slowly.  Rest between lifts.  Use carts and dollies to move  objects when possible.  Make several small trips instead of carrying 1 heavy load.  Ask for help when you need it.  Ask for help when moving big, awkward objects.  Follow these steps when lifting:  Stand with your feet shoulder-width apart.  Get as close to the object as you can. Do not try to pick up heavy objects that are far from your body.  Use handles or lifting straps if they are available.  Bend at your knees. Squat down, but keep your heels off the floor.  Keep your shoulders pulled back, your chin tucked in, and your back straight.  Lift the object slowly, tightening the muscles in your legs, abdomen, and buttocks. Keep the object as close to the center of your body as possible.  When you put a load down, use these same guidelines in reverse.  Do not:  Lift the object above your waist.  Twist at the waist while lifting or carrying a load. Move your feet if you need to turn, not your waist.  Bend over  without bending at your knees.  Avoid reaching over your head, across a table, or for an object on a high surface. OTHER TIPS  Avoid wet floors and keep sidewalks clear of ice to prevent falls.  Do not sleep on a mattress that is too soft or too hard.  Keep items that are used frequently within easy reach.  Put heavier objects on shelves at waist level and lighter objects on lower or higher shelves.  Find ways to decrease your stress, such as exercise, massage, or relaxation techniques. Stress can build up in your muscles. Tense muscles are more vulnerable to injury.  Seek treatment for depression or anxiety if needed. These conditions can increase your risk of developing back pain. SEEK MEDICAL CARE IF:  You injure your back.  You have questions about diet, exercise, or other ways to prevent back injuries. MAKE SURE YOU:  Understand these instructions.  Will watch your condition.  Will get help right away if you are not doing well or get worse. Document  Released: 10/15/2004 Document Revised: 11/30/2011 Document Reviewed: 10/19/2011 [facility] Patient Information 2015 [place], [place]. This information is not intended to replace advice given to you by your health care provider. Make sure you discuss any questions you have with your health care provider.

## 2015-03-24 NOTE — ED Notes (Signed)
Pt. woke up this morning with generalized body aches , entire back pain and bilateral legs pain  , denies fever or chills, no injury or fall .

## 2015-03-28 ENCOUNTER — Ambulatory Visit: Payer: Medicare Other | Admitting: Hematology and Oncology

## 2015-03-28 ENCOUNTER — Other Ambulatory Visit: Payer: Medicare Other

## 2015-03-28 ENCOUNTER — Telehealth: Payer: Self-pay | Admitting: Family Medicine

## 2015-03-28 NOTE — Telephone Encounter (Signed)
Tried calling patient, no answer no voicemail. Patient has not been seen in our office to follow up her diabetes or had an A1C since September 2015. Patient will need an appointment for diabetes management. Please inform if she calls back.

## 2015-03-28 NOTE — Telephone Encounter (Signed)
Pt called because she needs her insulin but there has been a issue with the insurance company and which one they will pay for. She said that the doctor was suppose to be figuring this out so that the correct medication can be called in. She has been out over a month now. She had to go to the ER because her sugar was way over 200. Please call patient as soon as you can . jw

## 2015-03-28 NOTE — Telephone Encounter (Signed)
Spoke with patient and offered appointment with PCP for next week. Patient states blood sugars have been extremely high and she feels she needs to be seen before then. SDA scheduled for tommorrow morning.

## 2015-03-29 ENCOUNTER — Ambulatory Visit (INDEPENDENT_AMBULATORY_CARE_PROVIDER_SITE_OTHER): Payer: Medicare Other | Admitting: Family Medicine

## 2015-03-29 ENCOUNTER — Encounter: Payer: Self-pay | Admitting: *Deleted

## 2015-03-29 ENCOUNTER — Encounter: Payer: Self-pay | Admitting: Family Medicine

## 2015-03-29 VITALS — BP 140/111 | HR 123 | Temp 98.3°F | Ht 62.0 in | Wt 275.9 lb

## 2015-03-29 DIAGNOSIS — E1165 Type 2 diabetes mellitus with hyperglycemia: Secondary | ICD-10-CM

## 2015-03-29 DIAGNOSIS — B3731 Acute candidiasis of vulva and vagina: Secondary | ICD-10-CM

## 2015-03-29 DIAGNOSIS — IMO0002 Reserved for concepts with insufficient information to code with codable children: Secondary | ICD-10-CM

## 2015-03-29 DIAGNOSIS — B373 Candidiasis of vulva and vagina: Secondary | ICD-10-CM | POA: Diagnosis not present

## 2015-03-29 LAB — POCT GLYCOSYLATED HEMOGLOBIN (HGB A1C): HEMOGLOBIN A1C: 10

## 2015-03-29 MED ORDER — METFORMIN HCL 1000 MG PO TABS: 1000.0000 mg | ORAL_TABLET | Freq: Two times a day (BID) | ORAL | Status: DC

## 2015-03-29 MED ORDER — FLUCONAZOLE 150 MG PO TABS: 150.0000 mg | ORAL_TABLET | Freq: Once | ORAL | Status: DC

## 2015-03-29 MED ORDER — INSULIN DETEMIR 100 UNIT/ML ~~LOC~~ SOLN: 20.0000 [IU] | Freq: Every day | SUBCUTANEOUS | Status: DC

## 2015-03-29 MED ORDER — TERCONAZOLE 0.4 % VA CREA: 1.0000 | TOPICAL_CREAM | Freq: Every evening | VAGINAL | Status: DC | PRN

## 2015-03-29 NOTE — Progress Notes (Deleted)
Subjective: Melissa Washington is a 29 y.o. female with a history of obesity  presenting for ***  Results for Melissa Washington, Melissa Washington (MRN [id number]) as of 03/29/2015 11:32  11/22/2009 09:15 10/09/2011 15:00 11/25/2011 19:17 01/24/2012 04:30 03/21/2012 13:30 08/30/2012 15:55 01/26/2013 15:10 06/07/2014 10:01  Hemoglobin A1C 6.4 8.6 7.7 (H) 8.1 (H) 10.5 (H) 8.6 8.6 9.9    - ROS: *** - Non***smoker  Objective: BP 140/111 mmHg  Pulse 123  Temp(Src) 98.3 F (36.8 C) (Oral)  Ht 5\' 2"  (1.575 m)  Wt 275 lb 14.4 oz (125.147 kg)  BMI 50.45 kg/m2  LMP 02/21/2015 Gen: *** 29 y.o. female in no distress ***  Assessment/Plan: Melissa Washington is a 29 y.o. female here for ***.  See problem list for plan.

## 2015-03-29 NOTE — Progress Notes (Signed)
No show letter signed by Dr. Alvy Bimler and given to Melissa Washington in [place] to mail certified mail.  Pt is d/c'd from our clinic due to numerous no shows for scheduled appts.  See letter dated today.

## 2015-03-29 NOTE — Assessment & Plan Note (Signed)
With hyperglycemia without anion gap, acidemia, electrolyte derangements in ED yesterday due to running out of medicines. Medications refilled today and will need to be followed closely by PCP.

## 2015-03-29 NOTE — Patient Instructions (Signed)
Start back taking levemir 20 units at bedtime, metformin twice daily, check sugars once before breakfast and record these to show your PCP. Make an appointment with your new PCP who is Dr. Lonny Prude on your way out today.   Take diflucan and the cream for yeast infection.   Continue taking lisinopril for high blood pressure - refills of this should still be at your pharmacy - Dr. Raoul Pitch prescribed a 1 year supply in Sept 2015.

## 2015-03-29 NOTE — Progress Notes (Signed)
Subjective: Melissa Washington is a 29 y.o. female here for diabetes follow up.   She was diagnosed with T2DM about 8 years ago. Began insulin in 2014, titrated up to 20 units levemir at bedtime, metformin 1000mg  BID, lisinopril 5mg . Has not been checking CBGs at home though she has the supplies. Ran out of insulin about 3 weeks ago and reported to ED last night with fatigue, polyuria, polydipsia. Had hyperglycemia with mild dehydration treated with IVF and discharged home. No gap or acidemia.   She is feeling well today with no new complaints, requesting refills. She has yet to meet her new PCP, Dr. Lonny Prude.   - ROS: Denies fever, chills, weight loss, dizziness, vision changes, syncope, paresthesias, chest pain, new wounds.    Objective: BP 140/111 mmHg  Pulse 123  Temp(Src) 98.3 F (36.8 C) (Oral)  Ht 5\' 2"  (1.575 m)  Wt 275 lb 14.4 oz (125.147 kg)  BMI 50.45 kg/m2  LMP 02/21/2015 Gen: Obese, well-appearing 29 y.o.female in no distress Pulm: Non-labored; CTAB, no wheezes  CV: Regular rate, no murmur appreciated; no LE edema, no JVD GI: Obese, Normoactive BS; soft, non-tender, non-distended, no HSM Skin: + acanthosis nigricans  Last Hb A1c (06/07/2014): 9.9%  Assessment & Plan: Melissa Washington is a 29 y.o. female here for diabetes and medication refills.

## 2015-04-04 ENCOUNTER — Encounter (HOSPITAL_COMMUNITY): Payer: Self-pay | Admitting: Neurology

## 2015-04-04 ENCOUNTER — Emergency Department (HOSPITAL_COMMUNITY)
Admission: EM | Admit: 2015-04-04 | Discharge: 2015-04-04 | Disposition: A | Discharge status: Home / Self Care | Payer: Medicare Other | Attending: Emergency Medicine | Admitting: Emergency Medicine

## 2015-04-04 ENCOUNTER — Emergency Department (HOSPITAL_COMMUNITY): Payer: Medicare Other

## 2015-04-04 DIAGNOSIS — R1012 Left upper quadrant pain: Secondary | ICD-10-CM

## 2015-04-04 DIAGNOSIS — Z79899 Other long term (current) drug therapy: Secondary | ICD-10-CM | POA: Diagnosis not present

## 2015-04-04 DIAGNOSIS — Z87442 Personal history of urinary calculi: Secondary | ICD-10-CM | POA: Diagnosis not present

## 2015-04-04 DIAGNOSIS — E1165 Type 2 diabetes mellitus with hyperglycemia: Secondary | ICD-10-CM | POA: Diagnosis not present

## 2015-04-04 DIAGNOSIS — Z856 Personal history of leukemia: Secondary | ICD-10-CM | POA: Insufficient documentation

## 2015-04-04 DIAGNOSIS — R739 Hyperglycemia, unspecified: Secondary | ICD-10-CM

## 2015-04-04 DIAGNOSIS — Z8719 Personal history of other diseases of the digestive system: Secondary | ICD-10-CM | POA: Insufficient documentation

## 2015-04-04 DIAGNOSIS — Z8619 Personal history of other infectious and parasitic diseases: Secondary | ICD-10-CM | POA: Diagnosis not present

## 2015-04-04 DIAGNOSIS — I10 Essential (primary) hypertension: Secondary | ICD-10-CM | POA: Diagnosis not present

## 2015-04-04 DIAGNOSIS — Z794 Long term (current) use of insulin: Secondary | ICD-10-CM | POA: Diagnosis not present

## 2015-04-04 LAB — URINALYSIS, ROUTINE W REFLEX MICROSCOPIC
Bilirubin Urine: NEGATIVE
GLUCOSE, UA: NEGATIVE mg/dL
HGB URINE DIPSTICK: NEGATIVE
KETONES UR: NEGATIVE mg/dL
LEUKOCYTES UA: NEGATIVE
Nitrite: NEGATIVE
PH: 5 (ref 5.0–8.0)
PROTEIN: NEGATIVE mg/dL
Specific Gravity, Urine: 1.012 (ref 1.005–1.030)
Urobilinogen, UA: 0.2 mg/dL (ref 0.0–1.0)

## 2015-04-04 LAB — COMPREHENSIVE METABOLIC PANEL
ALK PHOS: 59 U/L (ref 38–126)
ALT: 40 U/L (ref 14–54)
AST: 34 U/L (ref 15–41)
Albumin: 4.1 g/dL (ref 3.5–5.0)
Anion gap: 11 (ref 5–15)
BILIRUBIN TOTAL: 0.9 mg/dL (ref 0.3–1.2)
BUN: 11 mg/dL (ref 6–20)
CALCIUM: 9.3 mg/dL (ref 8.9–10.3)
CO2: 21 mmol/L — AB (ref 22–32)
Chloride: 106 mmol/L (ref 101–111)
Creatinine, Ser: 0.63 mg/dL (ref 0.44–1.00)
GFR calc non Af Amer: >60 mL/min (ref >60)
Glucose, Bld: 188 mg/dL — ABNORMAL HIGH (ref 65–99)
POTASSIUM: 4.1 mmol/L (ref 3.5–5.1)
Sodium: 138 mmol/L (ref 135–145)
Total Protein: 7 g/dL (ref 6.5–8.1)

## 2015-04-04 LAB — I-STAT BETA HCG BLOOD, ED (MC, WL, AP ONLY)

## 2015-04-04 LAB — CBC
HCT: 36.4 % (ref 36.0–46.0)
Hemoglobin: 12.6 g/dL (ref 12.0–15.0)
MCH: 28.8 pg (ref 26.0–34.0)
MCHC: 34.6 g/dL (ref 30.0–36.0)
MCV: 83.1 fL (ref 78.0–100.0)
Platelets: 218 10*3/uL (ref 150–400)
RBC: 4.38 MIL/uL (ref 3.87–5.11)
RDW: 12.9 % (ref 11.5–15.5)
WBC: 6.6 10*3/uL (ref 4.0–10.5)

## 2015-04-04 LAB — LIPASE, BLOOD: Lipase: 43 U/L (ref 22–51)

## 2015-04-04 MED ORDER — DICYCLOMINE HCL 10 MG PO CAPS
10.0000 mg | ORAL_CAPSULE | Freq: Once | ORAL | Status: AC
  Administered 2015-04-04: 10 mg via ORAL
  Filled 2015-04-04: qty 1

## 2015-04-04 MED ORDER — FAMOTIDINE 20 MG PO TABS
20.0000 mg | ORAL_TABLET | Freq: Once | ORAL | Status: AC
  Administered 2015-04-04: 20 mg via ORAL
  Filled 2015-04-04: qty 1

## 2015-04-04 MED ORDER — PROMETHAZINE HCL 25 MG PO TABS
25.0000 mg | ORAL_TABLET | ORAL | Status: AC
  Administered 2015-04-04: 25 mg via ORAL
  Filled 2015-04-04 (×2): qty 1

## 2015-04-04 MED ORDER — KETOROLAC TROMETHAMINE 60 MG/2ML IM SOLN
30.0000 mg | Freq: Once | INTRAMUSCULAR | Status: AC
  Administered 2015-04-04: 30 mg via INTRAMUSCULAR
  Filled 2015-04-04: qty 2

## 2015-04-04 MED ORDER — PROMETHAZINE HCL 25 MG PO TABS: 25.0000 mg | ORAL_TABLET | Freq: Four times a day (QID) | ORAL | Status: DC | PRN

## 2015-04-04 MED ORDER — DICYCLOMINE HCL 20 MG PO TABS: 20.0000 mg | ORAL_TABLET | Freq: Two times a day (BID) | ORAL | Status: DC

## 2015-04-04 NOTE — ED Provider Notes (Signed)
CSN: [id number]     Arrival date & time 04/04/15  1036 History   First MD Initiated Contact with Patient 04/04/15 1501     Chief Complaint  Patient presents with  . Abdominal Pain     (Consider location/radiation/quality/duration/timing/severity/associated sxs/prior Treatment) HPI   Blood pressure 128/96, pulse 69, temperature 98.3 F (36.8 C), temperature source Oral, resp. rate 20, height '5\' 2"'  (1.575 m), weight 276 lb (125.193 kg), last menstrual period 02/21/2015, SpO2 95 %, not currently breastfeeding.  Melissa Washington is a 29 y.o. female with past medical history significant for insulin-dependent diabetes, hypertension, nephrolithiasis complaining of acute worsening left upper quadrant pain radiating to the left flank onset this morning associated with nausea, no emesis, urinary frequency with dysuria. Patient denies fever, chills, emesis, abnormal vaginal discharge, change in bowel habits, hematuria. States that the pain is 10 out of 10, no exacerbating or alleviating factors identified. She took 800 mg Motrin at home with no relief.  Past Medical History  Diagnosis Date  . Hypertension   . Nephrolithiasis   . Hepatic steatosis 01/17/2012  . Cholelithiasis 01/17/2012  . Headache(784.0)   . Bacteremia associated with IV line 05/01/2013    Port-a-cath infection, removed 8/6 by IR. Ecoli grew out in cultures. Sent home with Ceftriaxone to complete 14 day course.   . Bacteremia due to Escherichia coli 05/01/2013    Port-a-cath infection, removed 8/6 by IR. Ecoli grew out in cultures. Sent home with Ceftriaxone to complete 14 day course.   . 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 Leukemia, acute monocytic, in remission   . Diabetes mellitus     type 2, insulin   Past Surgical History  Procedure Laterality Date  . Portacath placement      [facility]  . Cholecystectomy  01/31/2012    Procedure: LAPAROSCOPIC CHOLECYSTECTOMY WITH INTRAOPERATIVE CHOLANGIOGRAM;  Surgeon:  Joyice Faster. Cornett, MD;  Location: [facility];  Service: General;  Laterality: N/A;   Family History  Problem Relation Age of Onset  . Kidney disease Mother   . Hypertension Mother   . Epilepsy Mother   . Sleep apnea Mother   . Kidney disease Maternal Grandmother   . Diabetes Maternal Grandmother   . Diabetes Father    History  Substance Use Topics  . Smoking status: Never Smoker   . Smokeless tobacco: Never Used  . Alcohol Use: No   OB History    Gravida Para Term Preterm AB TAB SAB Ectopic Multiple Living   '2 2 2 ' 0 0 0 0 0 0 2     Review of Systems  10 systems reviewed and found to be negative, except as noted in the HPI.   Allergies  Ultram  Home Medications   Prior to Admission medications   Medication Sig Start Date End Date Taking? Authorizing Provider  ACCU-CHEK FASTCLIX LANCETS MISC 1 each by Does not apply route 3 (three) times daily. Check sugar 3x daily 06/07/14   Renee A Kuneff, DO  Blood Glucose Monitoring Suppl (ACCU-CHEK NANO SMARTVIEW) W/DEVICE KIT Check blood glucose x3 daily. Must take morning fasting blood glucose 06/07/14   Renee A Kuneff, DO  dicyclomine (BENTYL) 20 MG tablet Take 1 tablet (20 mg total) by mouth 2 (two) times daily. 04/04/15   Vilda Zollner, PA-C  fluconazole (DIFLUCAN) 150 MG tablet Take 1 tablet (150 mg total) by mouth once. X 2 days 03/29/15   Patrecia Pour, MD  gabapentin (NEURONTIN) 100 MG  capsule Take 1 capsule (100 mg total) by mouth 3 (three) times daily. 06/19/14   Renee A Kuneff, DO  glucose blood (ACCU-CHEK SMARTVIEW) test strip Check sugar 3 x daily 06/07/14   Renee A Kuneff, DO  insulin detemir (LEVEMIR) 100 UNIT/ML injection Inject 0.2 mLs (20 Units total) into the skin at bedtime. 03/29/15   Patrecia Pour, MD  Insulin Syringe-Needle U-100 (INSULIN SYRINGE 1CC/31GX5/16") 31G X 5/16" 1 ML MISC Use as directed to inject insulin. 01/20/15   Audelia Hives Presson, PA  lisinopril (PRINIVIL,ZESTRIL) 5 MG tablet Take 1 tablet (5 mg total) by  mouth daily. 06/19/14   Renee A Kuneff, DO  metFORMIN (GLUCOPHAGE) 1000 MG tablet Take 1 tablet (1,000 mg total) by mouth 2 (two) times daily with a meal. 03/29/15   Patrecia Pour, MD  promethazine (PHENERGAN) 25 MG tablet Take 1 tablet (25 mg total) by mouth every 6 (six) hours as needed for nausea or vomiting. 04/04/15   Elmyra Ricks Aundray Cartlidge, PA-C  QUEtiapine (SEROQUEL) 50 MG tablet Take 100 mg by mouth at bedtime.    Historical Provider, MD  terconazole (TERAZOL 7) 0.4 % vaginal cream Place 1 applicator vaginally at bedtime as needed. 03/29/15   Patrecia Pour, MD  triamcinolone cream (KENALOG) 0.1 % Apply 1 application topically 2 (two) times daily. 03/05/15   Bryan R Hess, DO   BP 119/73 mmHg  Pulse 72  Temp(Src) 98.1 F (36.7 C) (Oral)  Resp 16  Ht '5\' 2"'  (1.575 m)  Wt 276 lb (125.193 kg)  BMI 50.47 kg/m2  SpO2 100%  LMP 02/21/2015 Physical Exam  Constitutional: She is oriented to person, place, and time. She appears well-developed and well-nourished. No distress.  HENT:  Head: Normocephalic.  Eyes: Conjunctivae and EOM are normal.  Cardiovascular: Normal rate and regular rhythm.   Pulmonary/Chest: Effort normal and breath sounds normal. No stridor. No respiratory distress. She has no wheezes. She has no rales. She exhibits no tenderness.  Abdominal:  Normoactive bowel sounds, tender to palpation in the left upper quadrant with no guarding or rebound mild left CVA tenderness  Musculoskeletal: Normal range of motion.  Neurological: She is alert and oriented to person, place, and time.  Psychiatric: She has a normal mood and affect.  Nursing note and vitals reviewed.   ED Course  Procedures (including critical care time) Labs Review Labs Reviewed  COMPREHENSIVE METABOLIC PANEL - Abnormal; Notable for the following:    CO2 21 (*)    Glucose, Bld 188 (*)    All other components within normal limits  URINALYSIS, ROUTINE W REFLEX MICROSCOPIC (NOT AT [facility]) - Abnormal; Notable for the  following:    APPearance HAZY (*)    All other components within normal limits  LIPASE, BLOOD  CBC  I-STAT BETA HCG BLOOD, ED ([facility], [facility], [facility] ONLY)    Imaging Review US Renal  04/04/2015   CLINICAL DATA:  Left upper quadrant abdominal pain.  EXAM: RENAL / URINARY TRACT ULTRASOUND COMPLETE  COMPARISON:  None.  FINDINGS: Right Kidney:  Length: 12.0 cm. Echogenicity within normal limits. No mass or hydronephrosis visualized.  Left Kidney:  Length: 11.8 cm. Echogenicity within normal limits. No mass or hydronephrosis visualized.  Bladder:  Appears normal for degree of bladder distention. Bilateral ureteral jets are noted.  IMPRESSION: Normal renal ultrasound.   Electronically Signed   By: Marijo Conception, M.D.   On: 04/04/2015 16:21     EKG Interpretation None  MDM   Final diagnoses:  LUQ abdominal pain  Hyperglycemia without ketosis    Filed Vitals:   04/04/15 1047 04/04/15 1317 04/04/15 1651  BP: 146/96 128/96 119/73  Pulse: 79 69 72  Temp: 98.3 F (36.8 C)  98.1 F (36.7 C)  TempSrc: Oral  Oral  Resp: '15 20 16  ' Height: '5\' 2"'  (1.575 m)    Weight: 276 lb (125.193 kg)    SpO2: 94% 95% 100%    Medications  promethazine (PHENERGAN) tablet 25 mg (25 mg Oral Given 04/04/15 1538)  famotidine (PEPCID) tablet 20 mg (20 mg Oral Given 04/04/15 1532)  dicyclomine (BENTYL) capsule 10 mg (10 mg Oral Given 04/04/15 1532)  ketorolac (TORADOL) injection 30 mg (30 mg Intramuscular Given 04/04/15 1532)    Melissa Washington is a pleasant 29 y.o. female presenting with abdominal pain, radiates to the left flank, she is mildly tender to palpation on CVA percussion. Patient has history of kidney stones, will check a renal ultrasound. Blood work reassuring and UA is without infection. Her glucose is mildly elevated however she has a normal anion gap.  Repeat  abdominal exam is nonsurgical, renal ultrasound negative. Patient given Bentyl and Phenergan and advised to follow closely with her primary  care physician.  Evaluation does not show pathology that would require ongoing emergent intervention or inpatient treatment. Pt is hemodynamically stable and mentating appropriately. Discussed findings and plan with patient/guardian, who agrees with care plan. All questions answered. Return precautions discussed and outpatient follow up given.   Discharge Medication List as of 04/04/2015  4:38 PM    START taking these medications   Details  dicyclomine (BENTYL) 20 MG tablet Take 1 tablet (20 mg total) by mouth 2 (two) times daily., Starting 04/04/2015, Until Discontinued, Print    promethazine (PHENERGAN) 25 MG tablet Take 1 tablet (25 mg total) by mouth every 6 (six) hours as needed for nausea or vomiting., Starting 04/04/2015, Until Discontinued, [employer], PA-C 04/05/15 0004  Blanchie Dessert, MD 04/05/15 2227

## 2015-04-04 NOTE — Discharge Instructions (Signed)
Please follow with your primary care doctor in the next 2 days for a check-up. They must obtain records for further management.   Do not hesitate to return to the Emergency Department for any new, worsening or concerning symptoms.   Push fluids: take small frequent sips of water or Gatorade, do not drink any soda, juice or caffeinated beverages.    Slowly resume solid diet as desired. Avoid food that are spicy, contain dairy and/or have high fat content.  Aviod NSAIDs (aspirin, motrin, ibuprofen, naproxen, Aleve et Ronney Asters) for pain control because they will irritate your stomach.   Abdominal Pain, Women Abdominal (stomach, pelvic, or belly) pain can be caused by many things. It is important to tell your doctor:  The location of the pain.  Does it come and go or is it present all the time?  Are there things that start the pain (eating certain foods, exercise)?  Are there other symptoms associated with the pain (fever, nausea, vomiting, diarrhea)? All of this is helpful to know when trying to find the cause of the pain. CAUSES   Stomach: virus or bacteria infection, or ulcer.  Intestine: appendicitis (inflamed appendix), regional ileitis (Crohn's disease), ulcerative colitis (inflamed colon), irritable bowel syndrome, diverticulitis (inflamed diverticulum of the colon), or cancer of the stomach or intestine.  Gallbladder disease or stones in the gallbladder.  Kidney disease, kidney stones, or infection.  Pancreas infection or cancer.  Fibromyalgia (pain disorder).  Diseases of the female organs:  Uterus: fibroid (non-cancerous) tumors or infection.  Fallopian tubes: infection or tubal pregnancy.  Ovary: cysts or tumors.  Pelvic adhesions (scar tissue).  Endometriosis (uterus lining tissue growing in the pelvis and on the pelvic organs).  Pelvic congestion syndrome (female organs filling up with blood just before the menstrual period).  Pain with the menstrual  period.  Pain with ovulation (producing an egg).  Pain with an IUD (intrauterine device, birth control) in the uterus.  Cancer of the female organs.  Functional pain (pain not caused by a disease, may improve without treatment).  Psychological pain.  Depression. DIAGNOSIS  Your doctor will decide the seriousness of your pain by doing an examination.  Blood tests.  X-rays.  Ultrasound.  CT scan (computed tomography, special type of X-ray).  MRI (magnetic resonance imaging).  Cultures, for infection.  Barium enema (dye inserted in the large intestine, to better view it with X-rays).  Colonoscopy (looking in intestine with a lighted tube).  Laparoscopy (minor surgery, looking in abdomen with a lighted tube).  Major abdominal exploratory surgery (looking in abdomen with a large incision). TREATMENT  The treatment will depend on the cause of the pain.   Many cases can be observed and treated at home.  Over-the-counter medicines recommended by your caregiver.  Prescription medicine.  Antibiotics, for infection.  Birth control pills, for painful periods or for ovulation pain.  Hormone treatment, for endometriosis.  Nerve blocking injections.  Physical therapy.  Antidepressants.  Counseling with a psychologist or psychiatrist.  Minor or major surgery. HOME CARE INSTRUCTIONS   Do not take laxatives, unless directed by your caregiver.  Take over-the-counter pain medicine only if ordered by your caregiver. Do not take aspirin because it can cause an upset stomach or bleeding.  Try a clear liquid diet (broth or water) as ordered by your caregiver. Slowly move to a bland diet, as tolerated, if the pain is related to the stomach or intestine.  Have a thermometer and take your temperature several times a day,  and record it.  Bed rest and sleep, if it helps the pain.  Avoid sexual intercourse, if it causes pain.  Avoid stressful situations.  Keep your  follow-up appointments and tests, as your caregiver orders.  If the pain does not go away with medicine or surgery, you may try:  Acupuncture.  Relaxation exercises (yoga, meditation).  Group therapy.  Counseling. SEEK MEDICAL CARE IF:   You notice certain foods cause stomach pain.  Your home care treatment is not helping your pain.  You need stronger pain medicine.  You want your IUD removed.  You feel faint or lightheaded.  You develop nausea and vomiting.  You develop a rash.  You are having side effects or an allergy to your medicine. SEEK IMMEDIATE MEDICAL CARE IF:   Your pain does not go away or gets worse.  You have a fever.  Your pain is felt only in portions of the abdomen. The right side could possibly be appendicitis. The left lower portion of the abdomen could be colitis or diverticulitis.  You are passing blood in your stools (bright red or black tarry stools, with or without vomiting).  You have blood in your urine.  You develop chills, with or without a fever.  You pass out. MAKE SURE YOU:   Understand these instructions.  Will watch your condition.  Will get help right away if you are not doing well or get worse. Document Released: 07/05/2007 Document Revised: 01/22/2014 Document Reviewed: 07/25/2009 [facility] Patient Information 2015 [place], [place]. This information is not intended to replace advice given to you by your health care provider. Make sure you discuss any questions you have with your health care provider.

## 2015-04-04 NOTE — ED Notes (Signed)
Pt reports abd pain since this morning, it is on the left upper and middle side. Reports nausea, denies v/d. Pt is a x 4

## 2015-04-17 ENCOUNTER — Other Ambulatory Visit: Payer: Self-pay | Admitting: Family Medicine

## 2015-04-30 DIAGNOSIS — E119 Type 2 diabetes mellitus without complications: Secondary | ICD-10-CM | POA: Insufficient documentation

## 2015-04-30 DIAGNOSIS — Z79899 Other long term (current) drug therapy: Secondary | ICD-10-CM | POA: Diagnosis not present

## 2015-04-30 DIAGNOSIS — Z87448 Personal history of other diseases of urinary system: Secondary | ICD-10-CM | POA: Insufficient documentation

## 2015-04-30 DIAGNOSIS — Z794 Long term (current) use of insulin: Secondary | ICD-10-CM | POA: Diagnosis not present

## 2015-04-30 DIAGNOSIS — Z862 Personal history of diseases of the blood and blood-forming organs and certain disorders involving the immune mechanism: Secondary | ICD-10-CM | POA: Diagnosis not present

## 2015-04-30 DIAGNOSIS — R112 Nausea with vomiting, unspecified: Secondary | ICD-10-CM | POA: Diagnosis not present

## 2015-04-30 DIAGNOSIS — Z3202 Encounter for pregnancy test, result negative: Secondary | ICD-10-CM | POA: Insufficient documentation

## 2015-04-30 DIAGNOSIS — I10 Essential (primary) hypertension: Secondary | ICD-10-CM | POA: Insufficient documentation

## 2015-04-30 DIAGNOSIS — K625 Hemorrhage of anus and rectum: Secondary | ICD-10-CM | POA: Diagnosis present

## 2015-04-30 NOTE — ED Notes (Signed)
Patient presents stating she has been passing bright red blood

## 2015-05-01 ENCOUNTER — Encounter (HOSPITAL_COMMUNITY): Payer: Self-pay | Admitting: *Deleted

## 2015-05-01 ENCOUNTER — Emergency Department (HOSPITAL_COMMUNITY): Payer: Medicare Other

## 2015-05-01 ENCOUNTER — Emergency Department (HOSPITAL_COMMUNITY)
Admission: EM | Admit: 2015-05-01 | Discharge: 2015-05-01 | Disposition: A | Discharge status: Home / Self Care | Payer: Medicare Other | Attending: Emergency Medicine | Admitting: Emergency Medicine

## 2015-05-01 ENCOUNTER — Encounter (HOSPITAL_COMMUNITY): Payer: Self-pay | Admitting: Emergency Medicine

## 2015-05-01 DIAGNOSIS — Z856 Personal history of leukemia: Secondary | ICD-10-CM | POA: Diagnosis not present

## 2015-05-01 DIAGNOSIS — Z8619 Personal history of other infectious and parasitic diseases: Secondary | ICD-10-CM | POA: Insufficient documentation

## 2015-05-01 DIAGNOSIS — R531 Weakness: Secondary | ICD-10-CM | POA: Diagnosis not present

## 2015-05-01 DIAGNOSIS — R1010 Upper abdominal pain, unspecified: Secondary | ICD-10-CM | POA: Diagnosis present

## 2015-05-01 DIAGNOSIS — Z8719 Personal history of other diseases of the digestive system: Secondary | ICD-10-CM | POA: Insufficient documentation

## 2015-05-01 DIAGNOSIS — Z794 Long term (current) use of insulin: Secondary | ICD-10-CM | POA: Diagnosis not present

## 2015-05-01 DIAGNOSIS — Z87442 Personal history of urinary calculi: Secondary | ICD-10-CM | POA: Diagnosis not present

## 2015-05-01 DIAGNOSIS — R197 Diarrhea, unspecified: Secondary | ICD-10-CM | POA: Diagnosis not present

## 2015-05-01 DIAGNOSIS — R630 Anorexia: Secondary | ICD-10-CM | POA: Diagnosis not present

## 2015-05-01 DIAGNOSIS — E119 Type 2 diabetes mellitus without complications: Secondary | ICD-10-CM | POA: Diagnosis not present

## 2015-05-01 DIAGNOSIS — I10 Essential (primary) hypertension: Secondary | ICD-10-CM | POA: Diagnosis not present

## 2015-05-01 DIAGNOSIS — Z79899 Other long term (current) drug therapy: Secondary | ICD-10-CM | POA: Diagnosis not present

## 2015-05-01 DIAGNOSIS — R1012 Left upper quadrant pain: Secondary | ICD-10-CM | POA: Insufficient documentation

## 2015-05-01 DIAGNOSIS — R112 Nausea with vomiting, unspecified: Secondary | ICD-10-CM

## 2015-05-01 DIAGNOSIS — R509 Fever, unspecified: Secondary | ICD-10-CM | POA: Insufficient documentation

## 2015-05-01 HISTORY — DX: Systemic involvement of connective tissue, unspecified: M35.9

## 2015-05-01 LAB — CBC WITH DIFFERENTIAL/PLATELET
BASOS ABS: 0 10*3/uL (ref 0.0–0.1)
Basophils Absolute: 0 10*3/uL (ref 0.0–0.1)
Basophils Relative: 0 % (ref 0–1)
Basophils Relative: 0 % (ref 0–1)
EOS ABS: 0.1 10*3/uL (ref 0.0–0.7)
EOS PCT: 2 % (ref 0–5)
Eosinophils Absolute: 0.1 10*3/uL (ref 0.0–0.7)
Eosinophils Relative: 2 % (ref 0–5)
HCT: 38.4 % (ref 36.0–46.0)
HEMATOCRIT: 37.5 % (ref 36.0–46.0)
HEMOGLOBIN: 12.6 g/dL (ref 12.0–15.0)
Hemoglobin: 13.1 g/dL (ref 12.0–15.0)
LYMPHS ABS: 3.3 10*3/uL (ref 0.7–4.0)
LYMPHS PCT: 38 % (ref 12–46)
Lymphocytes Relative: 37 % (ref 12–46)
Lymphs Abs: 2.6 10*3/uL (ref 0.7–4.0)
MCH: 28.4 pg (ref 26.0–34.0)
MCH: 28.5 pg (ref 26.0–34.0)
MCHC: 33.6 g/dL (ref 30.0–36.0)
MCHC: 34.1 g/dL (ref 30.0–36.0)
MCV: 83.7 fL (ref 78.0–100.0)
MCV: 84.5 fL (ref 78.0–100.0)
MONOS PCT: 5 % (ref 3–12)
Monocytes Absolute: 0.4 10*3/uL (ref 0.1–1.0)
Monocytes Absolute: 0.4 10*3/uL (ref 0.1–1.0)
Monocytes Relative: 6 % (ref 3–12)
NEUTROS ABS: 3.8 10*3/uL (ref 1.7–7.7)
NEUTROS PCT: 55 % (ref 43–77)
Neutro Abs: 4.8 10*3/uL (ref 1.7–7.7)
Neutrophils Relative %: 55 % (ref 43–77)
PLATELETS: 244 10*3/uL (ref 150–400)
Platelets: 226 10*3/uL (ref 150–400)
RBC: 4.44 MIL/uL (ref 3.87–5.11)
RBC: 4.59 MIL/uL (ref 3.87–5.11)
RDW: 13.1 % (ref 11.5–15.5)
RDW: 13.1 % (ref 11.5–15.5)
WBC: 7 10*3/uL (ref 4.0–10.5)
WBC: 8.6 10*3/uL (ref 4.0–10.5)

## 2015-05-01 LAB — BASIC METABOLIC PANEL
ANION GAP: 12 (ref 5–15)
Anion gap: 12 (ref 5–15)
BUN: 13 mg/dL (ref 6–20)
BUN: 9 mg/dL (ref 6–20)
CALCIUM: 9.8 mg/dL (ref 8.9–10.3)
CHLORIDE: 104 mmol/L (ref 101–111)
CO2: 22 mmol/L (ref 22–32)
CO2: 23 mmol/L (ref 22–32)
Calcium: 9.5 mg/dL (ref 8.9–10.3)
Chloride: 103 mmol/L (ref 101–111)
Creatinine, Ser: 0.68 mg/dL (ref 0.44–1.00)
Creatinine, Ser: 0.74 mg/dL (ref 0.44–1.00)
GLUCOSE: 257 mg/dL — AB (ref 65–99)
Glucose, Bld: 156 mg/dL — ABNORMAL HIGH (ref 65–99)
Potassium: 4 mmol/L (ref 3.5–5.1)
Potassium: 5.4 mmol/L — ABNORMAL HIGH (ref 3.5–5.1)
SODIUM: 139 mmol/L (ref 135–145)
Sodium: 137 mmol/L (ref 135–145)

## 2015-05-01 LAB — URINALYSIS, ROUTINE W REFLEX MICROSCOPIC
BILIRUBIN URINE: NEGATIVE
Glucose, UA: 250 mg/dL — AB
HGB URINE DIPSTICK: NEGATIVE
Ketones, ur: NEGATIVE mg/dL
Nitrite: NEGATIVE
PROTEIN: NEGATIVE mg/dL
Specific Gravity, Urine: 1.014 (ref 1.005–1.030)
UROBILINOGEN UA: 0.2 mg/dL (ref 0.0–1.0)
pH: 5.5 (ref 5.0–8.0)

## 2015-05-01 LAB — HEPATIC FUNCTION PANEL
ALBUMIN: 4.3 g/dL (ref 3.5–5.0)
ALT: 27 U/L (ref 14–54)
AST: 44 U/L — AB (ref 15–41)
Alkaline Phosphatase: 58 U/L (ref 38–126)
BILIRUBIN TOTAL: 1.9 mg/dL — AB (ref 0.3–1.2)
Bilirubin, Direct: 0.6 mg/dL — ABNORMAL HIGH (ref 0.1–0.5)
Indirect Bilirubin: 1.3 mg/dL — ABNORMAL HIGH (ref 0.3–0.9)
Total Protein: 7.4 g/dL (ref 6.5–8.1)

## 2015-05-01 LAB — I-STAT CHEM 8, ED
BUN: 14 mg/dL (ref 6–20)
Calcium, Ion: 1.19 mmol/L (ref 1.12–1.23)
Chloride: 101 mmol/L (ref 101–111)
Creatinine, Ser: 0.7 mg/dL (ref 0.44–1.00)
GLUCOSE: 167 mg/dL — AB (ref 65–99)
HEMATOCRIT: 41 % (ref 36.0–46.0)
Hemoglobin: 13.9 g/dL (ref 12.0–15.0)
Potassium: 5.3 mmol/L — ABNORMAL HIGH (ref 3.5–5.1)
Sodium: 139 mmol/L (ref 135–145)
TCO2: 24 mmol/L (ref 0–100)

## 2015-05-01 LAB — URINE MICROSCOPIC-ADD ON

## 2015-05-01 LAB — POC OCCULT BLOOD, ED: Fecal Occult Bld: NEGATIVE

## 2015-05-01 LAB — POC URINE PREG, ED: Preg Test, Ur: NEGATIVE

## 2015-05-01 LAB — LIPASE, BLOOD: LIPASE: 35 U/L (ref 22–51)

## 2015-05-01 MED ORDER — ONDANSETRON 4 MG PO TBDP: 4.0000 mg | ORAL_TABLET | Freq: Three times a day (TID) | ORAL | Status: DC | PRN

## 2015-05-01 MED ORDER — ONDANSETRON 4 MG PO TBDP
4.0000 mg | ORAL_TABLET | Freq: Once | ORAL | Status: AC
  Administered 2015-05-01: 4 mg via ORAL
  Filled 2015-05-01: qty 1

## 2015-05-01 MED ORDER — LOPERAMIDE HCL 2 MG PO CAPS
2.0000 mg | ORAL_CAPSULE | Freq: Once | ORAL | Status: AC
  Administered 2015-05-01: 2 mg via ORAL
  Filled 2015-05-01: qty 1

## 2015-05-01 MED ORDER — IOHEXOL 300 MG/ML  SOLN
100.0000 mL | Freq: Once | INTRAMUSCULAR | Status: AC | PRN
  Administered 2015-05-01: 100 mL via INTRAVENOUS

## 2015-05-01 MED ORDER — ONDANSETRON HCL 4 MG/2ML IJ SOLN
4.0000 mg | Freq: Once | INTRAMUSCULAR | Status: AC
  Administered 2015-05-01: 4 mg via INTRAVENOUS
  Filled 2015-05-01: qty 2

## 2015-05-01 MED ORDER — IOHEXOL 300 MG/ML  SOLN
25.0000 mL | Freq: Once | INTRAMUSCULAR | Status: AC | PRN
  Administered 2015-05-01: 25 mL via ORAL

## 2015-05-01 MED ORDER — SODIUM CHLORIDE 0.9 % IV BOLUS (SEPSIS)
1000.0000 mL | Freq: Once | INTRAVENOUS | Status: AC
  Administered 2015-05-01: 1000 mL via INTRAVENOUS

## 2015-05-01 MED ORDER — MORPHINE SULFATE 4 MG/ML IJ SOLN
6.0000 mg | Freq: Once | INTRAMUSCULAR | Status: AC
  Administered 2015-05-01: 6 mg via INTRAVENOUS
  Filled 2015-05-01: qty 2

## 2015-05-01 NOTE — ED Notes (Signed)
Wasted 2mg  of morphine at 2313 with Ocie Bob, RN

## 2015-05-01 NOTE — Discharge Instructions (Signed)
Please follow up with GI specialist for further evaluation of your condition.    Nausea and Vomiting Nausea means you feel sick to your stomach. Throwing up (vomiting) is a reflex where stomach contents come out of your mouth. HOME CARE   Take medicine as told by your doctor.  Do not force yourself to eat. However, you do need to drink fluids.  If you feel like eating, eat a normal diet as told by your doctor.  Eat rice, wheat, potatoes, bread, lean meats, yogurt, fruits, and vegetables.  Avoid high-fat foods.  Drink enough fluids to keep your pee (urine) clear or pale yellow.  Ask your doctor how to replace body fluid losses (rehydrate). Signs of body fluid loss (dehydration) include:  Feeling very thirsty.  Dry lips and mouth.  Feeling dizzy.  Dark pee.  Peeing less than normal.  Feeling confused.  Fast breathing or heart rate. GET HELP RIGHT AWAY IF:   You have blood in your throw up.  You have black or bloody poop (stool).  You have a bad headache or stiff neck.  You feel confused.  You have bad belly (abdominal) pain.  You have chest pain or trouble breathing.  You do not pee at least once every 8 hours.  You have cold, clammy skin.  You keep throwing up after 24 to 48 hours.  You have a fever. MAKE SURE YOU:   Understand these instructions.  Will watch your condition.  Will get help right away if you are not doing well or get worse. Document Released: 02/24/2008 Document Revised: 11/30/2011 Document Reviewed: 02/06/2011 [facility] Patient Information 2015 [place], [place]. This information is not intended to replace advice given to you by your health care provider. Make sure you discuss any questions you have with your health care provider.  Diarrhea Diarrhea is watery poop (stool). It can make you feel weak, tired, thirsty, or give you a dry mouth (signs of dehydration). Watery poop is a sign of another problem, most often an infection. It often  lasts 2-3 days. It can last longer if it is a sign of something serious. Take care of yourself as told by your doctor. HOME CARE   Drink 1 cup (8 ounces) of fluid each time you have watery poop.  Do not drink the following fluids:  Those that contain simple sugars (fructose, glucose, galactose, lactose, sucrose, maltose).  Sports drinks.  Fruit juices.  Whole milk products.  Sodas.  Drinks with caffeine (coffee, tea, soda) or alcohol.  Oral rehydration solution may be used if the doctor says it is okay. You may make your own solution. Follow this recipe:   - teaspoon table salt.   teaspoon baking soda.   teaspoon salt substitute containing potassium chloride.  1 tablespoons sugar.  1 liter (34 ounces) of water.  Avoid the following foods:  High fiber foods, such as raw fruits and vegetables.  Nuts, seeds, and whole grain breads and cereals.   Those that are sweetened with sugar alcohols (xylitol, sorbitol, mannitol).  Try eating the following foods:  Starchy foods, such as rice, toast, pasta, low-sugar cereal, oatmeal, baked potatoes, crackers, and bagels.  Bananas.  Applesauce.  Eat probiotic-rich foods, such as yogurt and milk products that are fermented.  Wash your hands well after each time you have watery poop.  Only take medicine as told by your doctor.  Take a warm bath to help lessen burning or pain from having watery poop. GET HELP RIGHT AWAY IF:  You cannot drink fluids without throwing up (vomiting).  You keep throwing up.  You have blood in your poop, or your poop looks black and tarry.  You do not pee (urinate) in 6-8 hours, or there is only a small amount of very dark pee.  You have belly (abdominal) pain that gets worse or stays in the same spot (localizes).  You are weak, dizzy, confused, or light-headed.  You have a very bad headache.  Your watery poop gets worse or does not get better.  You have a fever or lasting symptoms  for more than 2-3 days.  You have a fever and your symptoms suddenly get worse. MAKE SURE YOU:   Understand these instructions.  Will watch your condition.  Will get help right away if you are not doing well or get worse. Document Released: 02/24/2008 Document Revised: 01/22/2014 Document Reviewed: 05/15/2012 [facility] Patient Information 2015 [place], [place]. This information is not intended to replace advice given to you by your health care provider. Make sure you discuss any questions you have with your health care provider.

## 2015-05-01 NOTE — ED Notes (Signed)
Pt. Stated, I was here yesterday for the same problem and they did nothing, they said it was a virus. Im also feeling some fogginess.

## 2015-05-01 NOTE — Discharge Instructions (Signed)
Nausea and Vomiting Melissa Washington, take zofran as needed for your nausea.  See a primary care physician within 3 days for close follow-up. If symptoms worsen come back to emergency department immediately. Thank you. Nausea means you feel sick to your stomach. Throwing up (vomiting) is a reflex where stomach contents come out of your mouth. HOME CARE   Take medicine as told by your doctor.  Do not force yourself to eat. However, you do need to drink fluids.  If you feel like eating, eat a normal diet as told by your doctor.  Eat rice, wheat, potatoes, bread, lean meats, yogurt, fruits, and vegetables.  Avoid high-fat foods.  Drink enough fluids to keep your pee (urine) clear or pale yellow.  Ask your doctor how to replace body fluid losses (rehydrate). Signs of body fluid loss (dehydration) include:  Feeling very thirsty.  Dry lips and mouth.  Feeling dizzy.  Dark pee.  Peeing less than normal.  Feeling confused.  Fast breathing or heart rate. GET HELP RIGHT AWAY IF:   You have blood in your throw up.  You have black or bloody poop (stool).  You have a bad headache or stiff neck.  You feel confused.  You have bad belly (abdominal) pain.  You have chest pain or trouble breathing.  You do not pee at least once every 8 hours.  You have cold, clammy skin.  You keep throwing up after 24 to 48 hours.  You have a fever. MAKE SURE YOU:   Understand these instructions.  Will watch your condition.  Will get help right away if you are not doing well or get worse. Document Released: 02/24/2008 Document Revised: 11/30/2011 Document Reviewed: 02/06/2011 [facility] Patient Information 2015 [place], [place]. This information is not intended to replace advice given to you by your health care provider. Make sure you discuss any questions you have with your health care provider.

## 2015-05-01 NOTE — ED Provider Notes (Signed)
CSN: [id number]     Arrival date & time 05/01/15  1330 History   First MD Initiated Contact with Patient 05/01/15 1446     Chief Complaint  Patient presents with  . Blood In Stools     (Consider location/radiation/quality/duration/timing/severity/associated sxs/prior Treatment) HPI   29 year old female with history of leukemia currently in remission, hypertension, hepatic steatosis, cholelithiasis status post cholecystectomy presenting for evaluation of abdominal pain. Patient report for the past month to 2 months she has had persistent loose stools after each meal. Since yesterday she developed crampy upper abdominal pain with associate nausea, one bout of nonbloody nonbilious vomit, and having blood in the stools.  Patient was seen earlier this morning with complaints of abdominal pain with associate nausea vomiting diarrhea and blood in toilet bowl after a bowel movement. Her workup was unremarkable and patient was diagnosed with having a viral infection and was discharged. Patient states the pain still remains. Pain is 9 out of 10. Patient endorse generalized weakness and decrease in appetite. She is a diabetic. She requesting for further evaluation of her condition. She denies alcohol abuse. She is currently on the Mirena. She also endorsed subjective fevers and chills. She denies has pain, shortness of breath, productive cough, hemoptysis, dysuria, hematuria, vaginal bleeding or vaginal discharge.  Past Medical History  Diagnosis Date  . Hypertension   . Nephrolithiasis   . Hepatic steatosis 01/17/2012  . Cholelithiasis 01/17/2012  . Headache(784.0)   . Bacteremia associated with IV line 05/01/2013    Port-a-cath infection, removed 8/6 by IR. Ecoli grew out in cultures. Sent home with Ceftriaxone to complete 14 day course.   . Bacteremia due to Escherichia coli 05/01/2013    Port-a-cath infection, removed 8/6 by IR. Ecoli grew out in cultures. Sent home with Ceftriaxone to complete 14 day  course.   . 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 Leukemia, acute monocytic, in remission   . Diabetes mellitus     type 2, insulin   Past Surgical History  Procedure Laterality Date  . Portacath placement      [facility]  . Cholecystectomy  01/31/2012    Procedure: LAPAROSCOPIC CHOLECYSTECTOMY WITH INTRAOPERATIVE CHOLANGIOGRAM;  Surgeon: Joyice Faster. Cornett, MD;  Location: [facility];  Service: General;  Laterality: N/A;   Family History  Problem Relation Age of Onset  . Kidney disease Mother   . Hypertension Mother   . Epilepsy Mother   . Sleep apnea Mother   . Kidney disease Maternal Grandmother   . Diabetes Maternal Grandmother   . Diabetes Father    Social History  Substance Use Topics  . Smoking status: Never Smoker   . Smokeless tobacco: Never Used  . Alcohol Use: No   OB History    Gravida Para Term Preterm AB TAB SAB Ectopic Multiple Living   '2 2 2 ' 0 0 0 0 0 0 2     Review of Systems  All other systems reviewed and are negative.     Allergies  Ultram  Home Medications   Prior to Admission medications   Medication Sig Start Date End Date Taking? Authorizing Provider  ACCU-CHEK FASTCLIX LANCETS MISC 1 each by Does not apply route 3 (three) times daily. Check sugar 3x daily 06/07/14  Yes Renee A Kuneff, DO  Blood Glucose Monitoring Suppl (ACCU-CHEK NANO SMARTVIEW) W/DEVICE KIT Check blood glucose x3 daily. Must take morning fasting blood glucose 06/07/14  Yes Renee A Kuneff, DO  gabapentin (NEURONTIN) 100  MG capsule Take 1 capsule (100 mg total) by mouth 3 (three) times daily. 06/19/14  Yes Renee A Kuneff, DO  glucose blood (ACCU-CHEK SMARTVIEW) test strip Check sugar 3 x daily 06/07/14  Yes Renee A Kuneff, DO  insulin detemir (LEVEMIR) 100 UNIT/ML injection Inject 0.2 mLs (20 Units total) into the skin at bedtime. 03/29/15  Yes Patrecia Pour, MD  Insulin Syringe-Needle U-100 (INSULIN SYRINGE 1CC/31GX5/16") 31G X 5/16" 1 ML MISC Use as directed to inject  insulin. 01/20/15  Yes Audelia Hives Presson, PA  lisinopril (PRINIVIL,ZESTRIL) 5 MG tablet Take 1 tablet (5 mg total) by mouth daily. 06/19/14  Yes Renee A Kuneff, DO  metFORMIN (GLUCOPHAGE) 1000 MG tablet Take 1 tablet (1,000 mg total) by mouth 2 (two) times daily with a meal. 03/29/15  Yes Patrecia Pour, MD  ondansetron (ZOFRAN ODT) 4 MG disintegrating tablet Take 1 tablet (4 mg total) by mouth every 8 (eight) hours as needed for nausea or vomiting. 05/01/15  Yes Everlene Balls, MD  promethazine (PHENERGAN) 25 MG tablet Take 1 tablet (25 mg total) by mouth every 6 (six) hours as needed for nausea or vomiting. 04/04/15  Yes Nicole Pisciotta, PA-C  QUEtiapine (SEROQUEL) 50 MG tablet Take 100 mg by mouth daily as needed (anxiety).    Yes Historical Provider, MD  terconazole (TERAZOL 7) 0.4 % vaginal cream Place 1 applicator vaginally at bedtime as needed. Patient taking differently: Place 1 applicator vaginally at bedtime as needed (yeast).  03/29/15  Yes Patrecia Pour, MD   BP 110/64 mmHg  Pulse 85  Temp(Src) 98.5 F (36.9 C)  Resp 18  SpO2 100% Physical Exam  Constitutional: She appears well-developed and well-nourished. No distress.   morbidly obese African-American female appears nontoxic and in no acute distress.  HENT:  Head: Atraumatic.  Eyes: Conjunctivae are normal.  Neck: Neck supple.  Cardiovascular: Normal rate and regular rhythm.   Pulmonary/Chest: Effort normal and breath sounds normal.  Abdominal: Soft. There is tenderness (Left upper quadrant tenderness to palpation without guarding or rebound tenderness. Negative Murphy sign, no pain at McBurney's point.). There is no rebound and no guarding.  Genitourinary:  No CVA tenderness.  Neurological: She is alert.  Skin: No rash noted.  Psychiatric: She has a normal mood and affect.  Nursing note and vitals reviewed.   ED Course  Procedures (including critical care time)  Patient here with history of diabetes and having left upper  quadrant abdominal pain. Examination is unremarkable. She was seen early this morning for the same complaint. She has a negative Hemoccults labs are reassuring. She however concern that her evaluation was not thorough this morning and requesting for additional evaluation and pain management. Workup initiated.  4:09 PM Istat8 shows K+ 5.3.  This morning's K+ is 4.0.  Therefore, i do not suspect pt has true hyperkalemia.  Will recheck K+.    4:57 PM Patient total bilirubin is 1.9 which is higher than her baseline. She has had prior cholecystectomy. Questionable retained stone in the common bile duct causing elevated total bili. Given the duration of her symptoms, plan to obtain abdominal and pelvis CT scan to rule out emergent pathology.  5:31 PM Repeat K+ is 5.4.  Will obtain EKG.  I discussed with Dr. Alvino Chapel who felt treatment not indicated at this time.    6:31 PM EKG shows no concerning peak T-wave. CT scan of abdomen pelvis unremarkable. I discussed labs and CT result with patient. I recommend patient to  follow-up with GI specialist for outpatient evaluation. Otherwise she is stable for discharge. She is able to tolerates by mouth. Return precautions discussed.  Labs Review Labs Reviewed  HEPATIC FUNCTION PANEL - Abnormal; Notable for the following:    AST 44 (*)    Total Bilirubin 1.9 (*)    Bilirubin, Direct 0.6 (*)    Indirect Bilirubin 1.3 (*)    All other components within normal limits  BASIC METABOLIC PANEL - Abnormal; Notable for the following:    Potassium 5.4 (*)    Glucose, Bld 156 (*)    All other components within normal limits  I-STAT CHEM 8, ED - Abnormal; Notable for the following:    Potassium 5.3 (*)    Glucose, Bld 167 (*)    All other components within normal limits  CBC WITH DIFFERENTIAL/PLATELET  LIPASE, BLOOD    Imaging Review Ct Abdomen Pelvis W Contrast  05/01/2015   CLINICAL DATA:  Constant midportion abdominal pain 1 day. Intermittent nausea and  vomiting. History of acute monocytic leukemia.  EXAM: CT ABDOMEN AND PELVIS WITH CONTRAST  TECHNIQUE: Multidetector CT imaging of the abdomen and pelvis was performed using the standard protocol following bolus administration of intravenous contrast. Oral contrast was also administered.  CONTRAST:  122m OMNIPAQUE IOHEXOL 300 MG/ML  SOLN  COMPARISON:  September 08, 2013  FINDINGS: Lung bases are clear.  Liver is prominent, measuring 24.3 cm in length. There is hepatic steatosis. No focal liver lesions are identified. Gallbladder is absent. There is no appreciable biliary duct dilatation.  Spleen, pancreas, and adrenals appear normal. Kidneys bilaterally show no mass or hydronephrosis on either side. There is no renal or ureteral calculus on either side. There are phleboliths near the distal right ureter but separate from the right ureter.  In the pelvis, urinary bladder is midline with normal wall thickness. There is an intrauterine device within the endometrial portion of the uterus. There is no demonstrable pelvic mass or pelvic fluid collection. Appendix appears normal.  There is no bowel obstruction.  No free air or portal venous air.  There is no appreciable ascites, adenopathy, or abscess in the abdomen or pelvis. There is no abdominal aortic aneurysm. There are no blastic or lytic bone lesions.  IMPRESSION: No bowel obstruction. No bowel wall or mesenteric thickening. Appendix appears normal. No abscess.  Intrauterine device within the endometrium.  No renal or ureteral calculus.  No hydronephrosis.  Prominent liver with hepatic steatosis.  Gallbladder absent.  A cause for the patient's symptoms has not been established with this study.   Electronically Signed   By: WLowella GripIII M.D.   On: 05/01/2015 18:05     EKG Interpretation   Date/Time:  Wednesday May 01 2015 18:15:33 EDT Ventricular Rate:  80 PR Interval:  142 QRS Duration: 85 QT Interval:  401 QTC Calculation: 463 R Axis:    13 Text Interpretation:  Sinus rhythm Borderline T abnormalities, anterior  leads Confirmed by KNOTT MD, DANIEL (([id number] on 05/01/2015 6:18:39 PM      MDM   Final diagnoses:  Nausea vomiting and diarrhea    BP 119/76 mmHg  Pulse 72  Temp(Src) 98.5 F (36.9 C)  Resp 16  SpO2 100%  I have reviewed nursing notes and vital signs. I personally viewed the imaging tests through PACS system and agrees with radiologist's intepretation I reviewed available ER/hospitalization records through the EMR     BDomenic Moras PA-C 05/01/15 [place] MD 05/06/15 1446

## 2015-05-01 NOTE — ED Notes (Signed)
wasted 2mg  of morphine in the sharps container with Ocie Bob, RN

## 2015-05-01 NOTE — ED Notes (Signed)
Family at bedside. 

## 2015-05-01 NOTE — ED Provider Notes (Signed)
CSN: [id number]     Arrival date & time 04/30/15  2344 History  This chart was scribed for Everlene Balls, MD by Rayna Sexton, ED scribe. This patient was seen in room A01C/A01C and the patient's care was started at 2:29 AM.  Chief Complaint  Patient presents with  . Rectal Bleeding   The history is provided by the patient. No language interpreter was used.    HPI Comments: Melissa Washington is a 29 y.o. female, with a SHx including cholecystectomy, who presents to the Emergency Department complaining of constant, moderate, centralized abd pain with onset earlier tonight. Pt notes that earlier tonight she also noticed blood in the toilet s/p a BM and further notes associated n/v/d immediately following any eating or drinking. Pt denies eating any abnormal foods or being around anyone sick. She denies any other associated symptoms.   Past Medical History  Diagnosis Date  . Hypertension   . Nephrolithiasis   . Hepatic steatosis 01/17/2012  . Cholelithiasis 01/17/2012  . Headache(784.0)   . Bacteremia associated with IV line 05/01/2013    Port-a-cath infection, removed 8/6 by IR. Ecoli grew out in cultures. Sent home with Ceftriaxone to complete 14 day course.   . Bacteremia due to Escherichia coli 05/01/2013    Port-a-cath infection, removed 8/6 by IR. Ecoli grew out in cultures. Sent home with Ceftriaxone to complete 14 day course.   . 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 Leukemia, acute monocytic, in remission   . Diabetes mellitus     type 2, insulin   Past Surgical History  Procedure Laterality Date  . Portacath placement      [facility]  . Cholecystectomy  01/31/2012    Procedure: LAPAROSCOPIC CHOLECYSTECTOMY WITH INTRAOPERATIVE CHOLANGIOGRAM;  Surgeon: Joyice Faster. Cornett, MD;  Location: [place] [facility];  Service: General;  Laterality: N/A;   Family History  Problem Relation Age of Onset  . Kidney disease Mother   . Hypertension Mother   . Epilepsy Mother   . Sleep apnea Mother    . Kidney disease Maternal Grandmother   . Diabetes Maternal Grandmother   . Diabetes Father    Social History  Substance Use Topics  . Smoking status: Never Smoker   . Smokeless tobacco: Never Used  . Alcohol Use: No   OB History    Gravida Para Term Preterm AB TAB SAB Ectopic Multiple Living   _0 0 0 0 0 0 0 2     Review of Systems A complete 10 system review of systems was obtained and all systems are negative except as noted in the HPI and PMH.   Allergies  Ultram  Home Medications   Prior to Admission medications   Medication Sig Start Date End Date Taking? Authorizing Provider  ACCU-CHEK FASTCLIX LANCETS MISC 1 each by Does not apply route 3 (three) times daily. Check sugar 3x daily 06/07/14   Renee A Kuneff, DO  Blood Glucose Monitoring Suppl (ACCU-CHEK NANO SMARTVIEW) W/DEVICE KIT Check blood glucose x3 daily. Must take morning fasting blood glucose 06/07/14   Renee A Kuneff, DO  dicyclomine (BENTYL) 20 MG tablet Take 1 tablet (20 mg total) by mouth 2 (two) times daily. 04/04/15   Nicole Pisciotta, PA-C  fluconazole (DIFLUCAN) 150 MG tablet Take 1 tablet (150 mg total) by mouth once. X 2 days 03/29/15   Patrecia Pour, MD  gabapentin (NEURONTIN) 100 MG capsule Take 1 capsule (100 mg total) by mouth 3 (three)  times daily. 06/19/14   Renee A Kuneff, DO  glucose blood (ACCU-CHEK SMARTVIEW) test strip Check sugar 3 x daily 06/07/14   Renee A Kuneff, DO  insulin detemir (LEVEMIR) 100 UNIT/ML injection Inject 0.2 mLs (20 Units total) into the skin at bedtime. 03/29/15   Patrecia Pour, MD  Insulin Syringe-Needle U-100 (INSULIN SYRINGE 1CC/31GX5/16") 31G X 5/16" 1 ML MISC Use as directed to inject insulin. 01/20/15   Audelia Hives Presson, PA  lisinopril (PRINIVIL,ZESTRIL) 5 MG tablet Take 1 tablet (5 mg total) by mouth daily. 06/19/14   Renee A Kuneff, DO  metFORMIN (GLUCOPHAGE) 1000 MG tablet Take 1 tablet (1,000 mg total) by mouth 2 (two) times daily with a meal. 03/29/15   Patrecia Pour, MD   promethazine (PHENERGAN) 25 MG tablet Take 1 tablet (25 mg total) by mouth every 6 (six) hours as needed for nausea or vomiting. 04/04/15   Elmyra Ricks Pisciotta, PA-C  QUEtiapine (SEROQUEL) 50 MG tablet Take 100 mg by mouth at bedtime.    Historical Provider, MD  terconazole (TERAZOL 7) 0.4 % vaginal cream Place 1 applicator vaginally at bedtime as needed. 03/29/15   Patrecia Pour, MD  triamcinolone cream (KENALOG) 0.1 % Apply 1 application topically 2 (two) times daily. 03/05/15   Bryan R Hess, DO   BP 117/82 mmHg  Pulse 86  Temp(Src) 98.7 F (37.1 C) (Oral)  Resp 16  Ht _0  (1.549 m)  Wt 276 lb 1.6 oz (125.238 kg)  BMI 52.20 kg/m2  SpO2 99% Physical Exam  Constitutional: She is oriented to person, place, and time. She appears well-developed and well-nourished. No distress.  HENT:  Head: Normocephalic and atraumatic.  Nose: Nose normal.  Mouth/Throat: Oropharynx is clear and moist. No oropharyngeal exudate.  Eyes: Conjunctivae and EOM are normal. Pupils are equal, round, and reactive to light. No scleral icterus.  Neck: Normal range of motion. Neck supple. No JVD present. No tracheal deviation present. No thyromegaly present.  Cardiovascular: Normal rate, regular rhythm and normal heart sounds.  Exam reveals no gallop and no friction rub.   No murmur heard. Pulmonary/Chest: Effort normal and breath sounds normal. No respiratory distress. She has no wheezes. She exhibits no tenderness.  Abdominal: Soft. Bowel sounds are normal. She exhibits no distension and no mass. There is no tenderness. There is no rebound and no guarding.  Musculoskeletal: Normal range of motion. She exhibits no edema or tenderness.  Lymphadenopathy:    She has no cervical adenopathy.  Neurological: She is alert and oriented to person, place, and time. No cranial nerve deficit. She exhibits normal muscle tone.  Skin: Skin is warm and dry. No rash noted. No erythema. No pallor.  Nursing note and vitals reviewed.   ED  Course  Procedures  DIAGNOSTIC STUDIES: Oxygen Saturation is 99% on RA, normal by my interpretation.    COORDINATION OF CARE: 2:34 AM Discussed treatment plan with pt at bedside and pt agreed to plan.  Labs Review Labs Reviewed  BASIC METABOLIC PANEL - Abnormal; Notable for the following:    Glucose, Bld 257 (*)    All other components within normal limits  URINALYSIS, ROUTINE W REFLEX MICROSCOPIC (NOT AT [facility]) - Abnormal; Notable for the following:    Glucose, UA 250 (*)    Leukocytes, UA TRACE (*)    All other components within normal limits  URINE MICROSCOPIC-ADD ON - Abnormal; Notable for the following:    Squamous Epithelial / LPF FEW (*)  Bacteria, UA FEW (*)    All other components within normal limits  CBC WITH DIFFERENTIAL/PLATELET  POC URINE PREG, ED  POC OCCULT BLOOD, ED    Imaging Review No results found.   EKG Interpretation None      MDM   Final diagnoses:  None   Patient presents emergency department for nausea vomiting diarrhea. She states she had bright red blood this evening as well. This occurred one time. Will obtain Hemoccults card for evaluation. Hemoglobin today is within normal limits. She states her gallbladder has already been removed. Lipase is also normal. Patient was given Zofran and Imodium in the emergency department.   Heme occult is negative. Will be discharged with a prescription for Zofran to take at home as needed. Primary care follow-up advised. She appears well in no acute distress. Her vital signs were within her normal limits and she is safe for discharge.  I personally performed the services described in this documentation, which was scribed in my presence. The recorded information has been reviewed and is accurate.     Everlene Balls, MD 05/01/15 [phone]

## 2015-07-15 ENCOUNTER — Emergency Department (HOSPITAL_COMMUNITY): Payer: Medicare Other

## 2015-07-15 ENCOUNTER — Encounter (HOSPITAL_COMMUNITY): Payer: Self-pay | Admitting: Vascular Surgery

## 2015-07-15 ENCOUNTER — Emergency Department (HOSPITAL_COMMUNITY)
Admission: EM | Admit: 2015-07-15 | Discharge: 2015-07-15 | Disposition: A | Discharge status: Home / Self Care | Payer: Medicare Other | Attending: Emergency Medicine | Admitting: Emergency Medicine

## 2015-07-15 DIAGNOSIS — M25552 Pain in left hip: Secondary | ICD-10-CM | POA: Diagnosis not present

## 2015-07-15 DIAGNOSIS — E119 Type 2 diabetes mellitus without complications: Secondary | ICD-10-CM | POA: Insufficient documentation

## 2015-07-15 DIAGNOSIS — Z8719 Personal history of other diseases of the digestive system: Secondary | ICD-10-CM | POA: Insufficient documentation

## 2015-07-15 DIAGNOSIS — I10 Essential (primary) hypertension: Secondary | ICD-10-CM | POA: Diagnosis not present

## 2015-07-15 DIAGNOSIS — Z87442 Personal history of urinary calculi: Secondary | ICD-10-CM | POA: Diagnosis not present

## 2015-07-15 DIAGNOSIS — Z794 Long term (current) use of insulin: Secondary | ICD-10-CM | POA: Insufficient documentation

## 2015-07-15 DIAGNOSIS — Z79899 Other long term (current) drug therapy: Secondary | ICD-10-CM | POA: Insufficient documentation

## 2015-07-15 NOTE — ED Notes (Signed)
Declined W/C at D/C and was escorted to lobby by RN. 

## 2015-07-15 NOTE — Discharge Instructions (Signed)
Please follow-up with her primary care provider in 2-3 days for reevaluation of symptoms persist, follow-up sooner as needed. Please read above information. Please use ibuprofen or Tylenol as needed for pain.

## 2015-07-15 NOTE — ED Notes (Signed)
Pt reports to the ED for eval of left hip pain x 1 week but it has gotten worse in the past day or so. Denies any injury that she is aware of. Tried OTC pain medication and muscle relaxers without relief. Denies any numbness, tingling, paralysis, or bowel or bladder changes. Pt A&Ox4, resp e/u, and skin warm and dry.

## 2015-07-15 NOTE — ED Provider Notes (Signed)
CSN: [id number]     Arrival date & time 07/15/15  1600 History  By signing my name below, I, Melissa Washington, attest that this documentation has been prepared under the direction and in the presence of Lenn Sink, PA-C Electronically Signed: Soijett Washington, ED Scribe. 07/15/2015. 5:12 PM.   Chief Complaint  Patient presents with  . Hip Pain      The history is provided by the patient. No language interpreter was used.    Melissa Washington is a 29 y.o. female with a medical hx of DM, HTN who presents to the Emergency Department complaining of constant, left hip pain onset 1 week worsening 1 day ago. She denies any injury/trauma to the left hip. She reports that with a change in position or ambulation she has pain and she has to lay on her right side and walk favoring her right side. Pt is having associated symptoms of gait problem due to pain. She notes that she has tried OTC pain medication and muscle relaxers with no relief of her symptoms. She denies color change, wound, rash, left leg pain, fever, chills, n/v, and any other symptoms. Denies hx of gout or joint infections.    Past Medical History  Diagnosis Date  . Hypertension   . Nephrolithiasis   . Hepatic steatosis 01/17/2012  . Cholelithiasis 01/17/2012  . Headache(784.0)   . Bacteremia associated with IV line ([place]) 05/01/2013    Port-a-cath infection, removed 8/6 by IR. Ecoli grew out in cultures. Sent home with Ceftriaxone to complete 14 day course.   . Bacteremia due to Escherichia coli 05/01/2013    Port-a-cath infection, removed 8/6 by IR. Ecoli grew out in cultures. Sent home with Ceftriaxone to complete 14 day course.   . 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 Leukemia, acute monocytic, in remission ([place])   . Diabetes mellitus     type 2, insulin  . Collagen vascular disease [facility])    Past Surgical History  Procedure Laterality Date  . Portacath placement      [facility]  . Cholecystectomy  01/31/2012   Procedure: LAPAROSCOPIC CHOLECYSTECTOMY WITH INTRAOPERATIVE CHOLANGIOGRAM;  Surgeon: Joyice Faster. Cornett, MD;  Location: [place] OR;  Service: General;  Laterality: N/A;   Family History  Problem Relation Age of Onset  . Kidney disease Mother   . Hypertension Mother   . Epilepsy Mother   . Sleep apnea Mother   . Kidney disease Maternal Grandmother   . Diabetes Maternal Grandmother   . Diabetes Father    Social History  Substance Use Topics  . Smoking status: Never Smoker   . Smokeless tobacco: Never Used  . Alcohol Use: No   OB History    Gravida Para Term Preterm AB TAB SAB Ectopic Multiple Living   '2 2 2 ' 0 0 0 0 0 0 2     Review of Systems  Constitutional: Negative for fever and chills.  Gastrointestinal: Negative for nausea and vomiting.  Musculoskeletal: Positive for arthralgias (left hip) and gait problem (due to pain).  Skin: Negative for color change, rash and wound.  Neurological: Negative for numbness.     Allergies  Ultram  Home Medications   Prior to Admission medications   Medication Sig Start Date End Date Taking? Authorizing Provider  ACCU-CHEK FASTCLIX LANCETS MISC 1 each by Does not apply route 3 (three) times daily. Check sugar 3x daily 06/07/14   Renee A Kuneff, DO  Blood Glucose Monitoring Suppl (ACCU-CHEK NANO  SMARTVIEW) W/DEVICE KIT Check blood glucose x3 daily. Must take morning fasting blood glucose 06/07/14   Renee A Kuneff, DO  gabapentin (NEURONTIN) 100 MG capsule Take 1 capsule (100 mg total) by mouth 3 (three) times daily. 06/19/14   Renee A Kuneff, DO  glucose blood (ACCU-CHEK SMARTVIEW) test strip Check sugar 3 x daily 06/07/14   Renee A Kuneff, DO  insulin detemir (LEVEMIR) 100 UNIT/ML injection Inject 0.2 mLs (20 Units total) into the skin at bedtime. 03/29/15   Patrecia Pour, MD  Insulin Syringe-Needle U-100 (INSULIN SYRINGE 1CC/31GX5/16") 31G X 5/16" 1 ML MISC Use as directed to inject insulin. 01/20/15   Audelia Hives Presson, PA  lisinopril  (PRINIVIL,ZESTRIL) 5 MG tablet Take 1 tablet (5 mg total) by mouth daily. 06/19/14   Renee A Kuneff, DO  metFORMIN (GLUCOPHAGE) 1000 MG tablet Take 1 tablet (1,000 mg total) by mouth 2 (two) times daily with a meal. 03/29/15   Patrecia Pour, MD  ondansetron (ZOFRAN ODT) 4 MG disintegrating tablet Take 1 tablet (4 mg total) by mouth every 8 (eight) hours as needed for nausea or vomiting. 05/01/15   Domenic Moras, PA-C  promethazine (PHENERGAN) 25 MG tablet Take 1 tablet (25 mg total) by mouth every 6 (six) hours as needed for nausea or vomiting. 04/04/15   Elmyra Ricks Pisciotta, PA-C  QUEtiapine (SEROQUEL) 50 MG tablet Take 100 mg by mouth daily as needed (anxiety).     Historical Provider, MD  terconazole (TERAZOL 7) 0.4 % vaginal cream Place 1 applicator vaginally at bedtime as needed. Patient taking differently: Place 1 applicator vaginally at bedtime as needed (yeast).  03/29/15   Patrecia Pour, MD   BP 107/65 mmHg  Pulse 87  Temp(Src) 98.4 F (36.9 C) (Oral)  Resp 16  SpO2 98%   Physical Exam  Constitutional: She is oriented to person, place, and time. She appears well-developed and well-nourished. No distress.  Morbidly obese  HENT:  Head: Normocephalic and atraumatic.  Eyes: EOM are normal.  Neck: Neck supple.  Cardiovascular: Normal rate.   Pulmonary/Chest: Effort normal. No respiratory distress.  Musculoskeletal: Normal range of motion.  TTP of the left hip joint diffusely. No warmth to touch. Full active ROM of the hip but with pain to flexion. No bruising, rash. Minor tenderness to left lateral thigh. Cap refill less than 3 seconds. Pedal pulses 2 +. Knee and ankle joints nl.   Neurological: She is alert and oriented to person, place, and time.  Skin: Skin is warm and dry.  Psychiatric: She has a normal mood and affect. Her behavior is normal.  Nursing note and vitals reviewed.   ED Course  Procedures (including critical care time) DIAGNOSTIC STUDIES: Oxygen Saturation is 98% on RA, nl by  my interpretation.    COORDINATION OF CARE: 5:08 PM Discussed treatment plan with pt at bedside which includes left hip xray and pt agreed to plan.    Labs Review Labs Reviewed - No data to display  Imaging Review Dg Hip Unilat With Pelvis 2-3 Views Left  07/15/2015  CLINICAL DATA:  Left hip pain for 1 week EXAM: DG HIP (WITH OR WITHOUT PELVIS) 2-3V LEFT COMPARISON:  None. FINDINGS: Three views of the left hip submitted. No acute fracture or subluxation. Bilateral hip joints are symmetrical in appearance. IUD noted in mid upper pelvis. IMPRESSION: Negative. Electronically Signed   By: Lahoma Crocker M.D.   On: 07/15/2015 17:41   I have personally reviewed and evaluated these images  as part of my medical decision-making.   EKG Interpretation None      MDM   Final diagnoses:  Hip pain, left    Labs:   Imaging: left hip unilateral with pelvis xray: IMPRESSION: Negative.  Consults:   Therapeutics:   Discharge Meds: Ibuprofen Rx  Assessment/Plan:   Ibuprofen Rx and f/u with PCP by the end of the week. Likely muscularly hip pain unlikely to be septic or gouty arthritis.  I personally performed the services described in this documentation, which was scribed in my presence. The recorded information has been reviewed and is accurate.      Okey Regal, PA-C 07/16/15 1651  Nat Christen, MD 07/18/15 1400

## 2015-07-17 ENCOUNTER — Telehealth: Payer: Self-pay | Admitting: Surgery

## 2015-07-17 ENCOUNTER — Encounter (HOSPITAL_BASED_OUTPATIENT_CLINIC_OR_DEPARTMENT_OTHER): Payer: Self-pay | Admitting: Emergency Medicine

## 2015-07-17 NOTE — Telephone Encounter (Addendum)
ED CM received call from patient regarding work note, Patient was seen 07/15/15 at [facility] ED for hip pain was then discharged to f/u with PCP work note provided for 10/24 to return to work on 10/26. CM Spoke with EDP, concerning this information, CM notified patient concerning this information.  No Further CM needs identified.

## 2015-09-17 ENCOUNTER — Encounter (HOSPITAL_COMMUNITY): Payer: Self-pay | Admitting: *Deleted

## 2015-09-17 ENCOUNTER — Emergency Department (HOSPITAL_COMMUNITY)
Admission: EM | Admit: 2015-09-17 | Discharge: 2015-09-17 | Disposition: A | Discharge status: Home / Self Care | Payer: Medicare Other | Attending: Emergency Medicine | Admitting: Emergency Medicine

## 2015-09-17 DIAGNOSIS — I10 Essential (primary) hypertension: Secondary | ICD-10-CM | POA: Insufficient documentation

## 2015-09-17 DIAGNOSIS — Z79899 Other long term (current) drug therapy: Secondary | ICD-10-CM | POA: Insufficient documentation

## 2015-09-17 DIAGNOSIS — E119 Type 2 diabetes mellitus without complications: Secondary | ICD-10-CM | POA: Insufficient documentation

## 2015-09-17 DIAGNOSIS — J0110 Acute frontal sinusitis, unspecified: Secondary | ICD-10-CM | POA: Insufficient documentation

## 2015-09-17 DIAGNOSIS — R51 Headache: Secondary | ICD-10-CM | POA: Insufficient documentation

## 2015-09-17 DIAGNOSIS — R0981 Nasal congestion: Secondary | ICD-10-CM | POA: Diagnosis present

## 2015-09-17 DIAGNOSIS — Z862 Personal history of diseases of the blood and blood-forming organs and certain disorders involving the immune mechanism: Secondary | ICD-10-CM | POA: Diagnosis not present

## 2015-09-17 DIAGNOSIS — H53149 Visual discomfort, unspecified: Secondary | ICD-10-CM | POA: Diagnosis not present

## 2015-09-17 DIAGNOSIS — Z794 Long term (current) use of insulin: Secondary | ICD-10-CM | POA: Diagnosis not present

## 2015-09-17 DIAGNOSIS — R519 Headache, unspecified: Secondary | ICD-10-CM

## 2015-09-17 DIAGNOSIS — R197 Diarrhea, unspecified: Secondary | ICD-10-CM | POA: Diagnosis not present

## 2015-09-17 DIAGNOSIS — Z87442 Personal history of urinary calculi: Secondary | ICD-10-CM | POA: Insufficient documentation

## 2015-09-17 LAB — CBG MONITORING, ED: GLUCOSE-CAPILLARY: 167 mg/dL — AB (ref 65–99)

## 2015-09-17 MED ORDER — DIPHENHYDRAMINE HCL 50 MG/ML IJ SOLN
50.0000 mg | Freq: Once | INTRAMUSCULAR | Status: AC
  Administered 2015-09-17: 50 mg via INTRAMUSCULAR
  Filled 2015-09-17: qty 1

## 2015-09-17 MED ORDER — PROCHLORPERAZINE EDISYLATE 5 MG/ML IJ SOLN
10.0000 mg | Freq: Four times a day (QID) | INTRAMUSCULAR | Status: DC | PRN
  Administered 2015-09-17: 10 mg via INTRAMUSCULAR
  Filled 2015-09-17: qty 2

## 2015-09-17 MED ORDER — FLUTICASONE PROPIONATE 50 MCG/ACT NA SUSP: 2.0000 | Freq: Every day | NASAL | Status: DC

## 2015-09-17 MED ORDER — KETOROLAC TROMETHAMINE 60 MG/2ML IM SOLN
60.0000 mg | Freq: Once | INTRAMUSCULAR | Status: AC
  Administered 2015-09-17: 60 mg via INTRAMUSCULAR
  Filled 2015-09-17: qty 2

## 2015-09-17 MED ORDER — PSEUDOEPHEDRINE HCL 60 MG PO TABS: 60.0000 mg | ORAL_TABLET | Freq: Four times a day (QID) | ORAL | Status: DC | PRN

## 2015-09-17 MED ORDER — PROCHLORPERAZINE MALEATE 10 MG PO TABS: 10.0000 mg | ORAL_TABLET | Freq: Three times a day (TID) | ORAL | Status: DC | PRN

## 2015-09-17 MED ORDER — IBUPROFEN 800 MG PO TABS: 800.0000 mg | ORAL_TABLET | Freq: Three times a day (TID) | ORAL | Status: DC | PRN

## 2015-09-17 NOTE — Discharge Instructions (Signed)
Read the information below.  Use the prescribed medication as directed.  Please discuss all new medications with your pharmacist.  You may return to the Emergency Department at any time for worsening condition or any new symptoms that concern you.    SEEK MEDICAL ATTENTION IF: You develop possible problems with medications prescribed.  The medications don't resolve your headache, if it recurs , or if you have multiple episodes of vomiting or can't take fluids. You have a change from the usual headache. RETURN IMMEDIATELY IF you develop a sudden, severe headache or confusion, become poorly responsive or faint, develop a fever above 100.68F or problem breathing, have a change in speech, vision, swallowing, or understanding, or develop new weakness, numbness, tingling, incoordination, or have a seizure.    Sinus Headache A sinus headache occurs when the paranasal sinuses become clogged or swollen. Paranasal sinuses are air pockets within the bones of the face. Sinus headaches can range from mild to severe. CAUSES A sinus headache can result from various conditions that affect the sinuses, such as:  Colds.  Sinus infections.  Allergies. SYMPTOMS The main symptom of this condition is a headache that may feel like pain or pressure in the face, forehead, ears, or upper teeth. People who have a sinus headache often have other symptoms, such as:  Congested or runny nose.  Fever.  Inability to smell. Weather changes can make symptoms worse. DIAGNOSIS This condition may be diagnosed based on:  A physical exam and medical history.  Imaging tests, such as a CT scan and MRI, to check for problems with the sinuses.  A specialist may look into the sinuses with a tool that has a camera (endoscopy). TREATMENT Treatment for this condition depends on the cause.  Sinus pain that is caused by a sinus infection may be treated with antibiotic medicine.  Sinus pain that is caused by allergies may be  helped by allergy medicines (antihistamines) and medicated nasal sprays.  Sinus pain that is caused by congestion may be helped by flushing the nose and sinuses with saline solution. HOME CARE INSTRUCTIONS  Take medicines only as directed by your health care provider.  If you were prescribed an antibiotic medicine, finish all of it even if you start to feel better.  If you have congestion, use a nasal spray to help reduce pressure.  If directed, apply a warm, moist washcloth to your face to help relieve pain. SEEK MEDICAL CARE IF:  You have headaches more than one time each week.  You have sensitivity to light or sound.  You have a fever.  You feel sick to your stomach (nauseous) or you throw up (vomit).  Your headaches do not get better with treatment. Many people think that they have a sinus headache when they actually have migraines or tension headaches. SEEK IMMEDIATE MEDICAL CARE IF:  You have vision problems.  You have sudden, severe pain in your face or head.  You have a seizure.  You are confused.  You have a stiff neck.   This information is not intended to replace advice given to you by your health care provider. Make sure you discuss any questions you have with your health care provider.   Document Released: 10/15/2004 Document Revised: 01/22/2015 Document Reviewed: 09/03/2014 Elsevier Interactive Patient Education 2016 [place] Headache Without Cause A headache is pain or discomfort felt around the head or neck area. The specific cause of a headache may not be found. There are many causes and  types of headaches. A few common ones are:  Tension headaches.  Migraine headaches.  Cluster headaches.  Chronic daily headaches. HOME CARE INSTRUCTIONS  Watch your condition for any changes. Take these steps to help with your condition: Managing Pain  Take over-the-counter and prescription medicines only as told by your health care  provider.  Lie down in a dark, quiet room when you have a headache.  If directed, apply ice to the head and neck area:  Put ice in a plastic bag.  Place a towel between your skin and the bag.  Leave the ice on for 20 minutes, 2-3 times per day.  Use a heating pad or hot shower to apply heat to the head and neck area as told by your health care provider.  Keep lights dim if bright lights bother you or make your headaches worse. Eating and Drinking  Eat meals on a regular schedule.  Limit alcohol use.  Decrease the amount of caffeine you drink, or stop drinking caffeine. General Instructions  Keep all follow-up visits as told by your health care provider. This is important.  Keep a headache journal to help find out what may trigger your headaches. For example, write down:  What you eat and drink.  How much sleep you get.  Any change to your diet or medicines.  Try massage or other relaxation techniques.  Limit stress.  Sit up straight, and do not tense your muscles.  Do not use tobacco products, including cigarettes, chewing tobacco, or e-cigarettes. If you need help quitting, ask your health care provider.  Exercise regularly as told by your health care provider.  Sleep on a regular schedule. Get 7-9 hours of sleep, or the amount recommended by your health care provider. SEEK MEDICAL CARE IF:   Your symptoms are not helped by medicine.  You have a headache that is different from the usual headache.  You have nausea or you vomit.  You have a fever. SEEK IMMEDIATE MEDICAL CARE IF:   Your headache becomes severe.  You have repeated vomiting.  You have a stiff neck.  You have a loss of vision.  You have problems with speech.  You have pain in the eye or ear.  You have muscular weakness or loss of muscle control.  You lose your balance or have trouble walking.  You feel faint or pass out.  You have confusion.   This information is not intended to  replace advice given to you by your health care provider. Make sure you discuss any questions you have with your health care provider.   Document Released: 09/07/2005 Document Revised: 05/29/2015 Document Reviewed: 12/31/2014 Elsevier Interactive Patient Education [employer].

## 2015-09-17 NOTE — ED Notes (Signed)
The pt has a cold cough face pain  Sinus drainage.  Then she felt nauseated with abd pain and a headache.  Burning in sinus when she breathes.  Lightheaded.  Constant sniffing.  lmp iud

## 2015-09-17 NOTE — ED Provider Notes (Signed)
CSN: [id number]     Arrival date & time 09/17/15  1628 History  By signing my name below, I, Melissa Washington, attest that this documentation has been prepared under the direction and in the presence of Clayton Bibles, PA-C Electronically Signed: Erling Washington, ED Scribe. 09/17/2015. 6:23 PM.    Chief Complaint  Patient presents with  . Nasal Congestion   The history is provided by the patient. No language interpreter was used.    HPI Comments: Melissa Washington is a 29 y.o. female who presents to the Emergency Department complaining of constant, moderate, sinus pressure HA onset several hours.  The pain is located over her forehead, eyes, and nose.   She developed nasal congestion and rhinorrhea with yellow nasal discharge last night.  She reports associated mild blurred vision, nausea, sensitivity to light and sound.   Pt has been taking Ibuprofen for her symptoms with no significant relief. Pt notes she has a h/o migraines with similar symptoms but has never been officially diagnosed.  She denies any dental pain, otalgia, cough, sore throat, difficulty swallowing or breathing.  Denies head injury, focal neurologic deficits.    Past Medical History  Diagnosis Date  . Hypertension   . Nephrolithiasis   . Hepatic steatosis 01/17/2012  . Cholelithiasis 01/17/2012  . Headache(784.0)   . Bacteremia associated with IV line ([place]) 05/01/2013    Port-a-cath infection, removed 8/6 by IR. Ecoli grew out in cultures. Sent home with Ceftriaxone to complete 14 day course.   . Bacteremia due to Escherichia coli 05/01/2013    Port-a-cath infection, removed 8/6 by IR. Ecoli grew out in cultures. Sent home with Ceftriaxone to complete 14 day course.   . 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 Leukemia, acute monocytic, in remission ([place])   . Diabetes mellitus     type 2, insulin  . Collagen vascular disease [facility])    Past Surgical History  Procedure Laterality Date  . Portacath placement       [facility]  . Cholecystectomy  01/31/2012    Procedure: LAPAROSCOPIC CHOLECYSTECTOMY WITH INTRAOPERATIVE CHOLANGIOGRAM;  Surgeon: Joyice Faster. Cornett, MD;  Location: [place] OR;  Service: General;  Laterality: N/A;   Family History  Problem Relation Age of Onset  . Kidney disease Mother   . Hypertension Mother   . Epilepsy Mother   . Sleep apnea Mother   . Kidney disease Maternal Grandmother   . Diabetes Maternal Grandmother   . Diabetes Father    Social History  Substance Use Topics  . Smoking status: Never Smoker   . Smokeless tobacco: Never Used  . Alcohol Use: No   OB History    Gravida Para Term Preterm AB TAB SAB Ectopic Multiple Living   '2 2 2 ' 0 0 0 0 0 0 2     Review of Systems  HENT: Positive for sinus pressure. Negative for congestion, dental problem, ear pain and rhinorrhea.   Eyes: Positive for photophobia and visual disturbance.  Respiratory: Negative for cough.   Gastrointestinal: Positive for nausea and diarrhea. Negative for vomiting.  Genitourinary: Positive for frequency. Negative for dysuria, vaginal bleeding and vaginal discharge.  Neurological: Positive for headaches.  All other systems reviewed and are negative.     Allergies  Ultram  Home Medications   Prior to Admission medications   Medication Sig Start Date End Date Taking? Authorizing Provider  insulin detemir (LEVEMIR) 100 UNIT/ML injection Inject 0.2 mLs (20 Units total) into the skin  at bedtime. 03/29/15  Yes Patrecia Pour, MD  lisinopril (PRINIVIL,ZESTRIL) 5 MG tablet Take 1 tablet (5 mg total) by mouth daily. 06/19/14  Yes Renee A Kuneff, DO  metFORMIN (GLUCOPHAGE) 1000 MG tablet Take 1 tablet (1,000 mg total) by mouth 2 (two) times daily with a meal. 03/29/15  Yes Patrecia Pour, MD  QUEtiapine (SEROQUEL) 50 MG tablet Take 100 mg by mouth daily as needed (anxiety).    Yes Historical Provider, MD  ACCU-CHEK FASTCLIX LANCETS MISC 1 each by Does not apply route 3 (three) times daily. Check sugar 3x daily  06/07/14   Renee A Kuneff, DO  Blood Glucose Monitoring Suppl (ACCU-CHEK NANO SMARTVIEW) W/DEVICE KIT Check blood glucose x3 daily. Must take morning fasting blood glucose 06/07/14   Renee A Kuneff, DO  gabapentin (NEURONTIN) 100 MG capsule Take 1 capsule (100 mg total) by mouth 3 (three) times daily. Patient not taking: Reported on 09/17/2015 06/19/14   Renee A Kuneff, DO  glucose blood (ACCU-CHEK SMARTVIEW) test strip Check sugar 3 x daily 06/07/14   Renee A Kuneff, DO  Insulin Syringe-Needle U-100 (INSULIN SYRINGE 1CC/31GX5/16") 31G X 5/16" 1 ML MISC Use as directed to inject insulin. 01/20/15   Audelia Hives Presson, PA  ondansetron (ZOFRAN ODT) 4 MG disintegrating tablet Take 1 tablet (4 mg total) by mouth every 8 (eight) hours as needed for nausea or vomiting. Patient not taking: Reported on 09/17/2015 05/01/15   Domenic Moras, PA-C  promethazine (PHENERGAN) 25 MG tablet Take 1 tablet (25 mg total) by mouth every 6 (six) hours as needed for nausea or vomiting. Patient not taking: Reported on 09/17/2015 04/04/15   Elmyra Ricks Pisciotta, PA-C  terconazole (TERAZOL 7) 0.4 % vaginal cream Place 1 applicator vaginally at bedtime as needed. Patient not taking: Reported on 09/17/2015 03/29/15   Patrecia Pour, MD   Triage Vitals: BP 125/88 mmHg  Pulse 95  Temp(Src) 99.3 F (37.4 C)  Resp 20  Ht '5\' 2"'  (1.575 m)  Wt 276 lb 1 oz (125.221 kg)  BMI 50.48 kg/m2  SpO2 98%  Physical Exam  Constitutional: She appears well-developed and well-nourished. No distress.  HENT:  Head: Normocephalic and atraumatic.  Nose: Mucosal edema present. Right sinus exhibits frontal sinus tenderness.  Mouth/Throat: Oropharynx is clear and moist. No oropharyngeal exudate.  Eyes: Conjunctivae are normal.  Neck: Neck supple.  Cardiovascular: Normal rate and regular rhythm.   Pulmonary/Chest: Effort normal and breath sounds normal. No respiratory distress. She has no wheezes. She has no rales.  Neurological: She is alert.  CN II-XII  intact, EOMs intact, no pronator drift, grip strengths equal bilaterally; strength 5/5 in all extremities, sensation intact in all extremities; finger to nose, heel to shin, rapid alternating movements normal.    Skin: She is not diaphoretic.  Nursing note and vitals reviewed.   ED Course  Procedures (including critical care time)  DIAGNOSTIC STUDIES: Oxygen Saturation is 98% on RA, normal by my interpretation.    COORDINATION OF CARE: 6:08 PM- Will order CBG.  Pt advised of plan for treatment and pt agrees.  6:34 PM- Will order migraine cocktail. Pt advised of plan for treatment and pt agrees.   Labs Review Labs Reviewed  CBG MONITORING, ED - Abnormal; Notable for the following:    Glucose-Capillary 167 (*)    All other components within normal limits    Imaging Review No results found. I have personally reviewed and evaluated these lab results as part of my medical decision-making.  EKG Interpretation None      MDM   Final diagnoses:  Acute nonintractable headache, unspecified headache type  Acute frontal sinusitis, recurrence not specified    Afebrile, nontoxic patient with frontal headache and sinus pressure likely related to sinusitis but possibly migraine.  Pt treated with migraine cocktail with great relief. Neurologically intact.   D/C home with symptomatic treatment for sinusitis, migraine headache. PCP follow up.  Discussed result, findings, treatment, and follow up  with patient.  Pt given return precautions.  Pt verbalizes understanding and agrees with plan.       I personally performed the services described in this documentation, which was scribed in my presence. The recorded information has been reviewed and is accurate.    Clayton Bibles, PA-C 09/17/15 2316  Julianne Rice, MD 09/21/15 [phone]

## 2015-10-10 ENCOUNTER — Emergency Department (HOSPITAL_COMMUNITY)
Admission: EM | Admit: 2015-10-10 | Discharge: 2015-10-10 | Disposition: A | Discharge status: Home / Self Care | Payer: Medicare Other | Attending: Emergency Medicine | Admitting: Emergency Medicine

## 2015-10-10 ENCOUNTER — Encounter (HOSPITAL_COMMUNITY): Payer: Self-pay | Admitting: Emergency Medicine

## 2015-10-10 DIAGNOSIS — Z794 Long term (current) use of insulin: Secondary | ICD-10-CM | POA: Insufficient documentation

## 2015-10-10 DIAGNOSIS — N898 Other specified noninflammatory disorders of vagina: Secondary | ICD-10-CM | POA: Diagnosis not present

## 2015-10-10 DIAGNOSIS — Z9049 Acquired absence of other specified parts of digestive tract: Secondary | ICD-10-CM | POA: Insufficient documentation

## 2015-10-10 DIAGNOSIS — Z8739 Personal history of other diseases of the musculoskeletal system and connective tissue: Secondary | ICD-10-CM | POA: Diagnosis not present

## 2015-10-10 DIAGNOSIS — Z7984 Long term (current) use of oral hypoglycemic drugs: Secondary | ICD-10-CM | POA: Diagnosis not present

## 2015-10-10 DIAGNOSIS — Z79899 Other long term (current) drug therapy: Secondary | ICD-10-CM | POA: Insufficient documentation

## 2015-10-10 DIAGNOSIS — R1013 Epigastric pain: Secondary | ICD-10-CM | POA: Diagnosis present

## 2015-10-10 DIAGNOSIS — Z8719 Personal history of other diseases of the digestive system: Secondary | ICD-10-CM | POA: Insufficient documentation

## 2015-10-10 DIAGNOSIS — Z3202 Encounter for pregnancy test, result negative: Secondary | ICD-10-CM | POA: Insufficient documentation

## 2015-10-10 DIAGNOSIS — Z856 Personal history of leukemia: Secondary | ICD-10-CM | POA: Insufficient documentation

## 2015-10-10 DIAGNOSIS — Z87442 Personal history of urinary calculi: Secondary | ICD-10-CM | POA: Insufficient documentation

## 2015-10-10 DIAGNOSIS — R739 Hyperglycemia, unspecified: Secondary | ICD-10-CM

## 2015-10-10 DIAGNOSIS — E1165 Type 2 diabetes mellitus with hyperglycemia: Secondary | ICD-10-CM | POA: Diagnosis not present

## 2015-10-10 DIAGNOSIS — I10 Essential (primary) hypertension: Secondary | ICD-10-CM | POA: Insufficient documentation

## 2015-10-10 LAB — COMPREHENSIVE METABOLIC PANEL
ALT: 31 U/L (ref 14–54)
ANION GAP: 11 (ref 5–15)
AST: 29 U/L (ref 15–41)
Albumin: 4.6 g/dL (ref 3.5–5.0)
Alkaline Phosphatase: 61 U/L (ref 38–126)
BILIRUBIN TOTAL: 1.2 mg/dL (ref 0.3–1.2)
BUN: 16 mg/dL (ref 6–20)
CHLORIDE: 105 mmol/L (ref 101–111)
CO2: 23 mmol/L (ref 22–32)
Calcium: 10.2 mg/dL (ref 8.9–10.3)
Creatinine, Ser: 0.69 mg/dL (ref 0.44–1.00)
GFR calc Af Amer: >60 mL/min (ref >60)
GFR calc non Af Amer: >60 mL/min (ref >60)
GLUCOSE: 296 mg/dL — AB (ref 65–99)
POTASSIUM: 4 mmol/L (ref 3.5–5.1)
SODIUM: 139 mmol/L (ref 135–145)
TOTAL PROTEIN: 8.3 g/dL — AB (ref 6.5–8.1)

## 2015-10-10 LAB — CBC WITH DIFFERENTIAL/PLATELET
BASOS ABS: 0 10*3/uL (ref 0.0–0.1)
BASOS PCT: 0 %
EOS ABS: 0.1 10*3/uL (ref 0.0–0.7)
EOS PCT: 2 %
HCT: 37.1 % (ref 36.0–46.0)
Hemoglobin: 12.7 g/dL (ref 12.0–15.0)
LYMPHS PCT: 31 %
Lymphs Abs: 2.4 10*3/uL (ref 0.7–4.0)
MCH: 28.5 pg (ref 26.0–34.0)
MCHC: 34.2 g/dL (ref 30.0–36.0)
MCV: 83.2 fL (ref 78.0–100.0)
MONO ABS: 0.5 10*3/uL (ref 0.1–1.0)
Monocytes Relative: 6 %
Neutro Abs: 4.8 10*3/uL (ref 1.7–7.7)
Neutrophils Relative %: 61 %
PLATELETS: 234 10*3/uL (ref 150–400)
RBC: 4.46 MIL/uL (ref 3.87–5.11)
RDW: 12.9 % (ref 11.5–15.5)
WBC: 7.8 10*3/uL (ref 4.0–10.5)

## 2015-10-10 LAB — URINALYSIS, ROUTINE W REFLEX MICROSCOPIC
Bilirubin Urine: NEGATIVE
Glucose, UA: >1000 mg/dL — AB
Hgb urine dipstick: NEGATIVE
KETONES UR: 15 mg/dL — AB
NITRITE: NEGATIVE
PH: 5 (ref 5.0–8.0)
PROTEIN: 30 mg/dL — AB
Specific Gravity, Urine: 1.02 (ref 1.005–1.030)

## 2015-10-10 LAB — PREGNANCY, URINE: Preg Test, Ur: NEGATIVE

## 2015-10-10 LAB — URINE MICROSCOPIC-ADD ON

## 2015-10-10 LAB — LIPASE, BLOOD: Lipase: 54 U/L — ABNORMAL HIGH (ref 11–51)

## 2015-10-10 MED ORDER — GI COCKTAIL ~~LOC~~
30.0000 mL | Freq: Once | ORAL | Status: AC
  Administered 2015-10-10: 30 mL via ORAL
  Filled 2015-10-10: qty 30

## 2015-10-10 MED ORDER — OMEPRAZOLE 20 MG PO CPDR: 20.0000 mg | DELAYED_RELEASE_CAPSULE | Freq: Every day | ORAL | Status: DC

## 2015-10-10 NOTE — ED Notes (Signed)
Pt reports abdominal pain since Tuesday.  Pain was in lower left quadrant of abdomen but now it hurts more in the epigastric region of the abdomen.  Pt c/o nausea only.  No appetite, seems to hurt when patient attempts to eat.  Pt has had prior gallbladder removal in 2014.

## 2015-10-10 NOTE — Discharge Instructions (Signed)
Abdominal Pain, Adult °Many things can cause abdominal pain. Usually, abdominal pain is not caused by a disease and will improve without treatment. It can often be observed and treated at home. Your health care provider will do a physical exam and possibly order blood tests and X-rays to help determine the seriousness of your pain. However, in many cases, more time must pass before a clear cause of the pain can be found. Before that point, your health care provider may not know if you need more testing or further treatment. °HOME CARE INSTRUCTIONS °Monitor your abdominal pain for any changes. The following actions may help to alleviate any discomfort you are experiencing: °· Only take over-the-counter or prescription medicines as directed by your health care provider. °· Do not take laxatives unless directed to do so by your health care provider. °· Try a clear liquid diet (broth, tea, or water) as directed by your health care provider. Slowly move to a bland diet as tolerated. °SEEK MEDICAL CARE IF: °· You have unexplained abdominal pain. °· You have abdominal pain associated with nausea or diarrhea. °· You have pain when you urinate or have a bowel movement. °· You experience abdominal pain that wakes you in the night. °· You have abdominal pain that is worsened or improved by eating food. °· You have abdominal pain that is worsened with eating fatty foods. °· You have a fever. °SEEK IMMEDIATE MEDICAL CARE IF: °· Your pain does not go away within 2 hours. °· You keep throwing up (vomiting). °· Your pain is felt only in portions of the abdomen, such as the right side or the left lower portion of the abdomen. °· You pass bloody or black tarry stools. °MAKE SURE YOU: °· Understand these instructions. °· Will watch your condition. °· Will get help right away if you are not doing well or get worse. °  °This information is not intended to replace advice given to you by your health care provider. Make sure you discuss  any questions you have with your health care provider. °  °Document Released: 06/17/2005 Document Revised: 05/29/2015 Document Reviewed: 05/17/2013 °Elsevier Interactive Patient Education ©2016 [employer] ° °Hyperglycemia °Hyperglycemia occurs when the glucose (sugar) in your blood is too high. Hyperglycemia can happen for many reasons, but it most often happens to people who do not know they have diabetes or are not managing their diabetes properly.  °CAUSES  °Whether you have diabetes or not, there are other causes of hyperglycemia. Hyperglycemia can occur when you have diabetes, but it can also occur in other situations that you might not be as aware of, such as: °Diabetes °· If you have diabetes and are having problems controlling your blood glucose, hyperglycemia could occur because of some of the following reasons: °¨ Not following your meal plan. °¨ Not taking your diabetes medications or not taking it properly. °¨ Exercising less or doing less activity than you normally do. °¨ Being sick. °Pre-diabetes °· This cannot be ignored. Before people develop Type 2 diabetes, they almost always have "pre-diabetes." This is when your blood glucose levels are higher than normal, but not yet high enough to be diagnosed as diabetes. Research has shown that some long-term damage to the body, especially the heart and circulatory system, may already be occurring during pre-diabetes. If you take action to manage your blood glucose when you have pre-diabetes, you may delay or prevent Type 2 diabetes from developing. °Stress °· If you have diabetes, you may be "diet"   controlled or on oral medications or insulin to control your diabetes. However, you may find that your blood glucose is higher than usual in the hospital whether you have diabetes or not. This is often referred to as "stress hyperglycemia." Stress can elevate your blood glucose. This happens because of hormones put out by the body during times of stress. If  stress has been the cause of your high blood glucose, it can be followed regularly by your caregiver. That way he/she can make sure your hyperglycemia does not continue to get worse or progress to diabetes. °Steroids °· Steroids are medications that act on the infection fighting system (immune system) to block inflammation or infection. One side effect can be a rise in blood glucose. Most people can produce enough extra insulin to allow for this rise, but for those who cannot, steroids make blood glucose levels go even higher. It is not unusual for steroid treatments to "uncover" diabetes that is developing. It is not always possible to determine if the hyperglycemia will go away after the steroids are stopped. A special blood test called an A1c is sometimes done to determine if your blood glucose was elevated before the steroids were started. °SYMPTOMS °· Thirsty. °· Frequent urination. °· Dry mouth. °· Blurred vision. °· Tired or fatigue. °· Weakness. °· Sleepy. °· Tingling in feet or leg. °DIAGNOSIS  °Diagnosis is made by monitoring blood glucose in one or all of the following ways: °· A1c test. This is a chemical found in your blood. °· Fingerstick blood glucose monitoring. °· Laboratory results. °TREATMENT  °First, knowing the cause of the hyperglycemia is important before the hyperglycemia can be treated. Treatment may include, but is not be limited to: °· Education. °· Change or adjustment in medications. °· Change or adjustment in meal plan. °· Treatment for an illness, infection, etc. °· More frequent blood glucose monitoring. °· Change in exercise plan. °· Decreasing or stopping steroids. °· Lifestyle changes. °HOME CARE INSTRUCTIONS  °· Test your blood glucose as directed. °· Exercise regularly. Your caregiver will give you instructions about exercise. Pre-diabetes or diabetes which comes on with stress is helped by exercising. °· Eat wholesome, balanced meals. Eat often and at regular, fixed times. Your  caregiver or nutritionist will give you a meal plan to guide your sugar intake. °· Being at an ideal weight is important. If needed, losing as little as 10 to 15 pounds may help improve blood glucose levels. °SEEK MEDICAL CARE IF:  °· You have questions about medicine, activity, or diet. °· You continue to have symptoms (problems such as increased thirst, urination, or weight gain). °SEEK IMMEDIATE MEDICAL CARE IF:  °· You are vomiting or have diarrhea. °· Your breath smells fruity. °· You are breathing faster or slower. °· You are very sleepy or incoherent. °· You have numbness, tingling, or pain in your feet or hands. °· You have chest pain. °· Your symptoms get worse even though you have been following your caregiver's orders. °· If you have any other questions or concerns. °  °This information is not intended to replace advice given to you by your health care provider. Make sure you discuss any questions you have with your health care provider. °  °Document Released: 03/03/2001 Document Revised: 11/30/2011 Document Reviewed: 05/14/2015 °Elsevier Interactive Patient Education ©2016 [employer] ° °

## 2015-10-10 NOTE — ED Provider Notes (Signed)
CSN: [id number]     Arrival date & time 10/10/15  0016 History   First MD Initiated Contact with Patient 10/10/15 0348     Chief Complaint  Patient presents with  . Abdominal Pain     (Consider location/radiation/quality/duration/timing/severity/associated sxs/prior Treatment) HPI Comments: Patient with history of leukemia (in remission), HTN, morbid obesity presents with complaint of abdominal pain that started in the LLQ and has extended to epigastric and RUQ quadrant area. Symptoms started 2 days ago as intermittent pain that is now more constant and more intense. No nausea or vomiting and no fever. The pain is worse with eating. She reports regular bowel movements without change and no diarrhea. She is s/p cholecystectomy in 2014. She has tried taking Tylenol and ibuprofen without relief.   Patient is a 30 y.o. female presenting with abdominal pain. The history is provided by the patient. No language interpreter was used.  Abdominal Pain Associated symptoms: no chills, no constipation, no diarrhea, no dysuria, no fever, no nausea, no vaginal discharge and no vomiting     Past Medical History  Diagnosis Date  . Hypertension   . Nephrolithiasis   . Hepatic steatosis 01/17/2012  . Cholelithiasis 01/17/2012  . Headache(784.0)   . Bacteremia associated with IV line ([place]) 05/01/2013    Port-a-cath infection, removed 8/6 by IR. Ecoli grew out in cultures. Sent home with Ceftriaxone to complete 14 day course.   . Bacteremia due to Escherichia coli 05/01/2013    Port-a-cath infection, removed 8/6 by IR. Ecoli grew out in cultures. Sent home with Ceftriaxone to complete 14 day course.   . 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 Leukemia, acute monocytic, in remission ([place])   . Diabetes mellitus     type 2, insulin  . Collagen vascular disease [facility])    Past Surgical History  Procedure Laterality Date  . Portacath placement      [facility]  . Cholecystectomy  01/31/2012     Procedure: LAPAROSCOPIC CHOLECYSTECTOMY WITH INTRAOPERATIVE CHOLANGIOGRAM;  Surgeon: Joyice Faster. Cornett, MD;  Location: [place] OR;  Service: General;  Laterality: N/A;   Family History  Problem Relation Age of Onset  . Kidney disease Mother   . Hypertension Mother   . Epilepsy Mother   . Sleep apnea Mother   . Kidney disease Maternal Grandmother   . Diabetes Maternal Grandmother   . Diabetes Father    Social History  Substance Use Topics  . Smoking status: Never Smoker   . Smokeless tobacco: Never Used  . Alcohol Use: No   OB History    Gravida Para Term Preterm AB TAB SAB Ectopic Multiple Living   '2 2 2 ' 0 0 0 0 0 0 2     Review of Systems  Constitutional: Negative for fever and chills.  HENT: Negative.   Respiratory: Negative.   Cardiovascular: Negative.   Gastrointestinal: Positive for abdominal pain. Negative for nausea, vomiting, diarrhea and constipation.  Genitourinary: Negative.  Negative for dysuria, frequency and vaginal discharge.  Musculoskeletal: Negative.  Negative for back pain.  Neurological: Negative.       Allergies  Ultram  Home Medications   Prior to Admission medications   Medication Sig Start Date End Date Taking? Authorizing Provider  insulin detemir (LEVEMIR) 100 UNIT/ML injection Inject 0.2 mLs (20 Units total) into the skin at bedtime. 03/29/15  Yes Patrecia Pour, MD  lisinopril (PRINIVIL,ZESTRIL) 5 MG tablet Take 1 tablet (5 mg total) by mouth daily. 06/19/14  Yes Renee A Kuneff, DO  metFORMIN (GLUCOPHAGE) 1000 MG tablet Take 1 tablet (1,000 mg total) by mouth 2 (two) times daily with a meal. 03/29/15  Yes Patrecia Pour, MD  ACCU-CHEK FASTCLIX LANCETS MISC 1 each by Does not apply route 3 (three) times daily. Check sugar 3x daily 06/07/14   Renee A Kuneff, DO  Blood Glucose Monitoring Suppl (ACCU-CHEK NANO SMARTVIEW) W/DEVICE KIT Check blood glucose x3 daily. Must take morning fasting blood glucose 06/07/14   Renee A Kuneff, DO  fluticasone (FLONASE) 50  MCG/ACT nasal spray Place 2 sprays into both nostrils daily. Patient not taking: Reported on 10/10/2015 09/17/15   Clayton Bibles, PA-C  gabapentin (NEURONTIN) 100 MG capsule Take 1 capsule (100 mg total) by mouth 3 (three) times daily. Patient not taking: Reported on 09/17/2015 06/19/14   Renee A Kuneff, DO  glucose blood (ACCU-CHEK SMARTVIEW) test strip Check sugar 3 x daily 06/07/14   Renee A Kuneff, DO  ibuprofen (ADVIL,MOTRIN) 800 MG tablet Take 1 tablet (800 mg total) by mouth every 8 (eight) hours as needed for mild pain or moderate pain. Patient not taking: Reported on 10/10/2015 09/17/15   Clayton Bibles, PA-C  Insulin Syringe-Needle U-100 (INSULIN SYRINGE 1CC/31GX5/16") 31G X 5/16" 1 ML MISC Use as directed to inject insulin. 01/20/15   Audelia Hives Presson, PA  ondansetron (ZOFRAN ODT) 4 MG disintegrating tablet Take 1 tablet (4 mg total) by mouth every 8 (eight) hours as needed for nausea or vomiting. Patient not taking: Reported on 09/17/2015 05/01/15   Domenic Moras, PA-C  prochlorperazine (COMPAZINE) 10 MG tablet Take 1 tablet (10 mg total) by mouth every 8 (eight) hours as needed for nausea or vomiting (and headache). Patient not taking: Reported on 10/10/2015 09/17/15   Clayton Bibles, PA-C  promethazine (PHENERGAN) 25 MG tablet Take 1 tablet (25 mg total) by mouth every 6 (six) hours as needed for nausea or vomiting. Patient not taking: Reported on 09/17/2015 04/04/15   Elmyra Ricks Pisciotta, PA-C  pseudoephedrine (SUDAFED) 60 MG tablet Take 1 tablet (60 mg total) by mouth every 6 (six) hours as needed for congestion. Patient not taking: Reported on 10/10/2015 09/17/15   Clayton Bibles, PA-C  terconazole (TERAZOL 7) 0.4 % vaginal cream Place 1 applicator vaginally at bedtime as needed. Patient not taking: Reported on 09/17/2015 03/29/15   Patrecia Pour, MD   BP 108/74 mmHg  Pulse 98  Temp(Src) 98.7 F (37.1 C) (Oral)  Resp 17  Ht '5\' 2"'  (1.575 m)  Wt 126.009 kg  BMI 50.80 kg/m2  SpO2 100% Physical Exam   Constitutional: She is oriented to person, place, and time. She appears well-developed and well-nourished.  HENT:  Head: Normocephalic.  Neck: Normal range of motion. Neck supple.  Cardiovascular: Normal rate and regular rhythm.   Pulmonary/Chest: Effort normal and breath sounds normal.  Abdominal: Soft. There is tenderness. There is no rebound and no guarding.  Tender right upper quadrant and left lower quadrant. Nontender LUQ, RLQ and suprapubic areas.   Musculoskeletal: Normal range of motion.  Neurological: She is alert and oriented to person, place, and time.  Skin: Skin is warm and dry. No rash noted.  Psychiatric: She has a normal mood and affect.    ED Course  Procedures (including critical care time) Labs Review Labs Reviewed  CBC WITH DIFFERENTIAL/PLATELET  COMPREHENSIVE METABOLIC PANEL  LIPASE, BLOOD  URINALYSIS, ROUTINE W REFLEX MICROSCOPIC (NOT AT [facility])  PREGNANCY, URINE   Results for orders placed or performed during  the hospital encounter of 10/10/15  CBC with Differential  Result Value Ref Range   WBC 7.8 4.0 - 10.5 K/uL   RBC 4.46 3.87 - 5.11 MIL/uL   Hemoglobin 12.7 12.0 - 15.0 g/dL   HCT 37.1 36.0 - 46.0 %   MCV 83.2 78.0 - 100.0 fL   MCH 28.5 26.0 - 34.0 pg   MCHC 34.2 30.0 - 36.0 g/dL   RDW 12.9 11.5 - 15.5 %   Platelets 234 150 - 400 K/uL   Neutrophils Relative % 61 %   Neutro Abs 4.8 1.7 - 7.7 K/uL   Lymphocytes Relative 31 %   Lymphs Abs 2.4 0.7 - 4.0 K/uL   Monocytes Relative 6 %   Monocytes Absolute 0.5 0.1 - 1.0 K/uL   Eosinophils Relative 2 %   Eosinophils Absolute 0.1 0.0 - 0.7 K/uL   Basophils Relative 0 %   Basophils Absolute 0.0 0.0 - 0.1 K/uL  Comprehensive metabolic panel  Result Value Ref Range   Sodium 139 135 - 145 mmol/L   Potassium 4.0 3.5 - 5.1 mmol/L   Chloride 105 101 - 111 mmol/L   CO2 23 22 - 32 mmol/L   Glucose, Bld 296 (H) 65 - 99 mg/dL   BUN 16 6 - 20 mg/dL   Creatinine, Ser 0.69 0.44 - 1.00 mg/dL   Calcium 10.2  8.9 - 10.3 mg/dL   Total Protein 8.3 (H) 6.5 - 8.1 g/dL   Albumin 4.6 3.5 - 5.0 g/dL   AST 29 15 - 41 U/L   ALT 31 14 - 54 U/L   Alkaline Phosphatase 61 38 - 126 U/L   Total Bilirubin 1.2 0.3 - 1.2 mg/dL   GFR calc non Af Amer >60 >60 mL/min   GFR calc Af Amer >60 >60 mL/min   Anion gap 11 5 - 15  Lipase, blood  Result Value Ref Range   Lipase 54 (H) 11 - 51 U/L  Urinalysis, Routine w reflex microscopic  Result Value Ref Range   Color, Urine YELLOW YELLOW   APPearance CLOUDY (A) CLEAR   Specific Gravity, Urine 1.020 1.005 - 1.030   pH 5.0 5.0 - 8.0   Glucose, UA >1000 (A) NEGATIVE mg/dL   Hgb urine dipstick NEGATIVE NEGATIVE   Bilirubin Urine NEGATIVE NEGATIVE   Ketones, ur 15 (A) NEGATIVE mg/dL   Protein, ur 30 (A) NEGATIVE mg/dL   Nitrite NEGATIVE NEGATIVE   Leukocytes, UA MODERATE (A) NEGATIVE  Pregnancy, urine  Result Value Ref Range   Preg Test, Ur NEGATIVE NEGATIVE  Urine microscopic-add on  Result Value Ref Range   Squamous Epithelial / LPF 6-30 (A) NONE SEEN   WBC, UA 6-30 0 - 5 WBC/hpf   RBC / HPF 0-5 0 - 5 RBC/hpf   Bacteria, UA MANY (A) NONE SEEN    Imaging Review No results found. I have personally reviewed and evaluated these images and lab results as part of my medical decision-making.   EKG Interpretation None      MDM   Final diagnoses:  None    1. Abdominal pain 2. Leukorrhea  3. Hyperglycemia  The patient's pain is improved with GI cocktail. Labs are reassuring. No fever, or vomiting. Doubt intra-abdominal pathology or infection. There is evidence of leukorrhea without dysuria or frequency. Culture pending. No antibiotics required for now. The patient's sugar is elevated to 296 without evidence of acidosis. Encouraged compliance with medications and PCP follow up.     Charlann Lange, PA-C  10/10/15 [place], MD 10/10/15 2016

## 2015-10-11 LAB — URINE CULTURE

## 2015-11-03 ENCOUNTER — Emergency Department (HOSPITAL_COMMUNITY)
Admission: EM | Admit: 2015-11-03 | Discharge: 2015-11-03 | Disposition: A | Discharge status: Home / Self Care | Payer: Medicaid Other | Attending: Emergency Medicine | Admitting: Emergency Medicine

## 2015-11-03 ENCOUNTER — Encounter (HOSPITAL_COMMUNITY): Payer: Self-pay

## 2015-11-03 DIAGNOSIS — R519 Headache, unspecified: Secondary | ICD-10-CM

## 2015-11-03 DIAGNOSIS — R51 Headache: Secondary | ICD-10-CM | POA: Diagnosis present

## 2015-11-03 DIAGNOSIS — Z7984 Long term (current) use of oral hypoglycemic drugs: Secondary | ICD-10-CM | POA: Diagnosis not present

## 2015-11-03 DIAGNOSIS — M25552 Pain in left hip: Secondary | ICD-10-CM | POA: Diagnosis not present

## 2015-11-03 DIAGNOSIS — Z87442 Personal history of urinary calculi: Secondary | ICD-10-CM | POA: Insufficient documentation

## 2015-11-03 DIAGNOSIS — Z794 Long term (current) use of insulin: Secondary | ICD-10-CM | POA: Diagnosis not present

## 2015-11-03 DIAGNOSIS — I10 Essential (primary) hypertension: Secondary | ICD-10-CM | POA: Diagnosis not present

## 2015-11-03 DIAGNOSIS — Z8719 Personal history of other diseases of the digestive system: Secondary | ICD-10-CM | POA: Diagnosis not present

## 2015-11-03 DIAGNOSIS — Z79899 Other long term (current) drug therapy: Secondary | ICD-10-CM | POA: Diagnosis not present

## 2015-11-03 DIAGNOSIS — R110 Nausea: Secondary | ICD-10-CM | POA: Insufficient documentation

## 2015-11-03 DIAGNOSIS — G8929 Other chronic pain: Secondary | ICD-10-CM | POA: Diagnosis not present

## 2015-11-03 DIAGNOSIS — Z856 Personal history of leukemia: Secondary | ICD-10-CM | POA: Diagnosis not present

## 2015-11-03 DIAGNOSIS — E119 Type 2 diabetes mellitus without complications: Secondary | ICD-10-CM | POA: Diagnosis not present

## 2015-11-03 MED ORDER — NAPROXEN 500 MG PO TABS: 500.0000 mg | ORAL_TABLET | Freq: Two times a day (BID) | ORAL | Status: DC

## 2015-11-03 MED ORDER — DIPHENHYDRAMINE HCL 50 MG/ML IJ SOLN
25.0000 mg | Freq: Once | INTRAMUSCULAR | Status: AC
  Administered 2015-11-03: 25 mg via INTRAVENOUS
  Filled 2015-11-03: qty 1

## 2015-11-03 MED ORDER — METOCLOPRAMIDE HCL 5 MG/ML IJ SOLN
10.0000 mg | Freq: Once | INTRAMUSCULAR | Status: AC
  Administered 2015-11-03: 10 mg via INTRAVENOUS
  Filled 2015-11-03: qty 2

## 2015-11-03 MED ORDER — SODIUM CHLORIDE 0.9 % IV BOLUS (SEPSIS)
1000.0000 mL | Freq: Once | INTRAVENOUS | Status: AC
  Administered 2015-11-03: 1000 mL via INTRAVENOUS

## 2015-11-03 MED ORDER — PROMETHAZINE HCL 25 MG PO TABS: 25.0000 mg | ORAL_TABLET | Freq: Four times a day (QID) | ORAL | Status: DC | PRN

## 2015-11-03 NOTE — ED Notes (Signed)
Pt reports onset 1`-2 days nausea, headache, body aches.  Pt c/o chronic left hip pain, pain is worsening.

## 2015-11-03 NOTE — Discharge Instructions (Signed)
You were seen in the ER today for evaluation of headache and left hip pain. Your exam was normal and reassuring. Please follow up with Dr. Ninfa Linden for your chronic hip pain. Please also follow up with neurology for further evaluation and management of your chronic headaches. I will give you prescriptions that should help with both issues. Return to the ER for new or worsening symptoms.

## 2015-11-03 NOTE — ED Provider Notes (Signed)
CSN: [id number]     Arrival date & time 11/03/15  1712 History   First MD Initiated Contact with Patient 11/03/15 [place]     Chief Complaint  Patient presents with  . Headache  . Generalized Body Aches    HPI   Ms. Hancox is an 30 y.o. female with history of HTN, DM, leukemia (remission), chronic headaches who presents to the ED for headache. She states she has a history of chronic migraines but feels like she is getting them more frequently lately. States she has had her current headache for the past several days. She states it is a diffuse frontal headache. Reports associated nausea but denies vomiting. States she will be able to tell she has a headache coming on because her thinking will become "foggy" and she feels like she gets tunnel vision. She states she has tried "all" kinds of medication with no relief.   Pt is also complaining of left hip pain which is chronic. She denies new injury or trauma. States she has seen ortho (Dr. Ninfa Linden) and was diagnosed with buristis. She states she gets steroid injections but they only temporarily relieve the pain. She states she has been trying ibuprofen and stretching with no relief.   Past Medical History  Diagnosis Date  . Hypertension   . Nephrolithiasis   . Hepatic steatosis 01/17/2012  . Cholelithiasis 01/17/2012  . Headache(784.0)   . Bacteremia associated with IV line ([place]) 05/01/2013    Port-a-cath infection, removed 8/6 by IR. Ecoli grew out in cultures. Sent home with Ceftriaxone to complete 14 day course.   . Bacteremia due to Escherichia coli 05/01/2013    Port-a-cath infection, removed 8/6 by IR. Ecoli grew out in cultures. Sent home with Ceftriaxone to complete 14 day course.   . 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 Leukemia, acute monocytic, in remission ([place])   . Diabetes mellitus     type 2, insulin  . Collagen vascular disease [facility])    Past Surgical History  Procedure Laterality Date  . Portacath  placement      [facility]  . Cholecystectomy  01/31/2012    Procedure: LAPAROSCOPIC CHOLECYSTECTOMY WITH INTRAOPERATIVE CHOLANGIOGRAM;  Surgeon: Joyice Faster. Cornett, MD;  Location: [place] OR;  Service: General;  Laterality: N/A;   Family History  Problem Relation Age of Onset  . Kidney disease Mother   . Hypertension Mother   . Epilepsy Mother   . Sleep apnea Mother   . Kidney disease Maternal Grandmother   . Diabetes Maternal Grandmother   . Diabetes Father    Social History  Substance Use Topics  . Smoking status: Never Smoker   . Smokeless tobacco: Never Used  . Alcohol Use: No   OB History    Gravida Para Term Preterm AB TAB SAB Ectopic Multiple Living   '2 2 2 ' 0 0 0 0 0 0 2     Review of Systems  All other systems reviewed and are negative.     Allergies  Ultram  Home Medications   Prior to Admission medications   Medication Sig Start Date End Date Taking? Authorizing Provider  ACCU-CHEK FASTCLIX LANCETS MISC 1 each by Does not apply route 3 (three) times daily. Check sugar 3x daily 06/07/14   Renee A Kuneff, DO  Blood Glucose Monitoring Suppl (ACCU-CHEK NANO SMARTVIEW) W/DEVICE KIT Check blood glucose x3 daily. Must take morning fasting blood glucose 06/07/14   Renee A Kuneff, DO  fluticasone (FLONASE) 50  MCG/ACT nasal spray Place 2 sprays into both nostrils daily. Patient not taking: Reported on 10/10/2015 09/17/15   Clayton Bibles, PA-C  gabapentin (NEURONTIN) 100 MG capsule Take 1 capsule (100 mg total) by mouth 3 (three) times daily. Patient not taking: Reported on 09/17/2015 06/19/14   Renee A Kuneff, DO  glucose blood (ACCU-CHEK SMARTVIEW) test strip Check sugar 3 x daily 06/07/14   Renee A Kuneff, DO  ibuprofen (ADVIL,MOTRIN) 800 MG tablet Take 1 tablet (800 mg total) by mouth every 8 (eight) hours as needed for mild pain or moderate pain. Patient not taking: Reported on 10/10/2015 09/17/15   Clayton Bibles, PA-C  insulin detemir (LEVEMIR) 100 UNIT/ML injection Inject 0.2 mLs (20  Units total) into the skin at bedtime. 03/29/15   Patrecia Pour, MD  Insulin Syringe-Needle U-100 (INSULIN SYRINGE 1CC/31GX5/16") 31G X 5/16" 1 ML MISC Use as directed to inject insulin. 01/20/15   Audelia Hives Presson, PA  lisinopril (PRINIVIL,ZESTRIL) 5 MG tablet Take 1 tablet (5 mg total) by mouth daily. 06/19/14   Renee A Kuneff, DO  metFORMIN (GLUCOPHAGE) 1000 MG tablet Take 1 tablet (1,000 mg total) by mouth 2 (two) times daily with a meal. 03/29/15   Patrecia Pour, MD  omeprazole (PRILOSEC) 20 MG capsule Take 1 capsule (20 mg total) by mouth daily. 10/10/15   Shari Upstill, PA-C  ondansetron (ZOFRAN ODT) 4 MG disintegrating tablet Take 1 tablet (4 mg total) by mouth every 8 (eight) hours as needed for nausea or vomiting. Patient not taking: Reported on 09/17/2015 05/01/15   Domenic Moras, PA-C  prochlorperazine (COMPAZINE) 10 MG tablet Take 1 tablet (10 mg total) by mouth every 8 (eight) hours as needed for nausea or vomiting (and headache). Patient not taking: Reported on 10/10/2015 09/17/15   Clayton Bibles, PA-C  promethazine (PHENERGAN) 25 MG tablet Take 1 tablet (25 mg total) by mouth every 6 (six) hours as needed for nausea or vomiting. Patient not taking: Reported on 09/17/2015 04/04/15   Elmyra Ricks Pisciotta, PA-C  pseudoephedrine (SUDAFED) 60 MG tablet Take 1 tablet (60 mg total) by mouth every 6 (six) hours as needed for congestion. Patient not taking: Reported on 10/10/2015 09/17/15   Clayton Bibles, PA-C  terconazole (TERAZOL 7) 0.4 % vaginal cream Place 1 applicator vaginally at bedtime as needed. Patient not taking: Reported on 09/17/2015 03/29/15   Patrecia Pour, MD   BP 112/82 mmHg  Pulse 90  Temp(Src) 98.8 F (37.1 C) (Oral)  Resp 18  Ht '5\' 1"'  (1.549 m)  Wt 124.739 kg  BMI 51.99 kg/m2  SpO2 98% Physical Exam  Constitutional: She is oriented to person, place, and time.  HENT:  Right Ear: External ear normal.  Left Ear: External ear normal.  Nose: Nose normal.  Mouth/Throat: Oropharynx is  clear and moist. No oropharyngeal exudate.  Eyes: Conjunctivae and EOM are normal. Pupils are equal, round, and reactive to light.  Neck: Full passive range of motion without pain. Neck supple. No rigidity. No Brudzinski's sign and no Kernig's sign noted.  Cardiovascular: Normal rate, regular rhythm, normal heart sounds and intact distal pulses.   Pulmonary/Chest: Effort normal and breath sounds normal. No respiratory distress. She has no wheezes. She exhibits no tenderness.  Abdominal: Soft. Bowel sounds are normal. She exhibits no distension. There is no tenderness. There is no rebound and no guarding.  Musculoskeletal: She exhibits no edema.  No left hip or leg tenderness. FROM of hip. Bilateral UE and LE strength and sensation intact  and symmetrical.   Neurological: She is alert and oriented to person, place, and time. No cranial nerve deficit.  Skin: Skin is warm and dry.  Psychiatric: She has a normal mood and affect.  Nursing note and vitals reviewed.   ED Course  Procedures (including critical care time) Labs Review Labs Reviewed - No data to display  Imaging Review No results found. I have personally reviewed and evaluated these images and lab results as part of my medical decision-making.   EKG Interpretation None      MDM   Final diagnoses:  Acute nonintractable headache, unspecified headache type  Chronic left hip pain    Pt is an 30 y.o. female with chronic headaches that are becoming more frequent. No new symptoms. No focal neuro findings. She is afebrile, no neck rigidity, no meningismus. No midline back pain. Headache resolved with migraine cocktail in the ED. Will refer to neurology for ongoing evaluation and management of chronic headaches.  Her hip pain is also chronic. She has seen ortho, diagnosed with bursitis. She has no tenderness or neuro deficits on my exam. I discussed with pt that she may continue taking naproxen as needed but to f/u with Dr. Ninfa Linden  for steroid injection/further management of her chronic hip pain. She is in agreement and understanding of plan. ER return precautions given.     Anne Ng, PA-C 11/04/15 [place], MD 11/06/15 [phone]

## 2015-11-18 ENCOUNTER — Encounter (HOSPITAL_COMMUNITY): Payer: Self-pay | Admitting: Emergency Medicine

## 2015-11-18 ENCOUNTER — Emergency Department (HOSPITAL_COMMUNITY)
Admission: EM | Admit: 2015-11-18 | Discharge: 2015-11-18 | Disposition: A | Discharge status: Home / Self Care | Payer: Medicaid Other | Attending: Emergency Medicine | Admitting: Emergency Medicine

## 2015-11-18 DIAGNOSIS — Z87442 Personal history of urinary calculi: Secondary | ICD-10-CM | POA: Diagnosis not present

## 2015-11-18 DIAGNOSIS — Z7984 Long term (current) use of oral hypoglycemic drugs: Secondary | ICD-10-CM | POA: Diagnosis not present

## 2015-11-18 DIAGNOSIS — Z79899 Other long term (current) drug therapy: Secondary | ICD-10-CM | POA: Insufficient documentation

## 2015-11-18 DIAGNOSIS — Z794 Long term (current) use of insulin: Secondary | ICD-10-CM | POA: Insufficient documentation

## 2015-11-18 DIAGNOSIS — E119 Type 2 diabetes mellitus without complications: Secondary | ICD-10-CM | POA: Insufficient documentation

## 2015-11-18 DIAGNOSIS — Z8739 Personal history of other diseases of the musculoskeletal system and connective tissue: Secondary | ICD-10-CM | POA: Diagnosis not present

## 2015-11-18 DIAGNOSIS — I10 Essential (primary) hypertension: Secondary | ICD-10-CM | POA: Insufficient documentation

## 2015-11-18 DIAGNOSIS — Z791 Long term (current) use of non-steroidal anti-inflammatories (NSAID): Secondary | ICD-10-CM | POA: Insufficient documentation

## 2015-11-18 DIAGNOSIS — M436 Torticollis: Secondary | ICD-10-CM | POA: Diagnosis not present

## 2015-11-18 DIAGNOSIS — Z8719 Personal history of other diseases of the digestive system: Secondary | ICD-10-CM | POA: Insufficient documentation

## 2015-11-18 DIAGNOSIS — M542 Cervicalgia: Secondary | ICD-10-CM | POA: Diagnosis present

## 2015-11-18 DIAGNOSIS — Z856 Personal history of leukemia: Secondary | ICD-10-CM | POA: Diagnosis not present

## 2015-11-18 MED ORDER — OXYCODONE-ACETAMINOPHEN 5-325 MG PO TABS
2.0000 | ORAL_TABLET | Freq: Once | ORAL | Status: AC
  Administered 2015-11-18: 2 via ORAL
  Filled 2015-11-18: qty 2

## 2015-11-18 MED ORDER — KETOROLAC TROMETHAMINE 60 MG/2ML IM SOLN
60.0000 mg | Freq: Once | INTRAMUSCULAR | Status: AC
  Administered 2015-11-18: 60 mg via INTRAMUSCULAR
  Filled 2015-11-18: qty 2

## 2015-11-18 MED ORDER — DIAZEPAM 5 MG PO TABS: 5.0000 mg | ORAL_TABLET | Freq: Four times a day (QID) | ORAL | Status: DC | PRN

## 2015-11-18 NOTE — ED Notes (Signed)
Reports back pain for last two days from neck down to lower back.  Took ibuprofen 800mg  earlier today with no relief.

## 2015-11-18 NOTE — Discharge Instructions (Signed)
Acute Torticollis °Torticollis is a condition in which the muscles of the neck tighten (contract) abnormally, causing the neck to twist and the head to move into an unnatural position. Torticollis that develops suddenly is called acute torticollis. If torticollis becomes chronic and is left untreated, the face and neck can become deformed. °CAUSES °This condition may be caused by: °· Sleeping in an awkward position (common). °· Extending or twisting the neck muscles beyond their normal position. °· Infection. °In some cases, the cause may not be known. °SYMPTOMS °Symptoms of this condition include: °· An unnatural position of the head. °· Neck pain. °· A limited ability to move the neck. °· Twisting of the neck to one side. °DIAGNOSIS °This condition is diagnosed with a physical exam. You may also have imaging tests, such as an X-ray, CT scan, or MRI. °TREATMENT °Treatment for this condition involves trying to relax the neck muscles. It may include: °· Medicines or shots. °· Physical therapy. °· Surgery. This may be done in severe cases. °HOME CARE INSTRUCTIONS °· Take medicines only as directed by your health care provider. °· Do stretching exercises and massage your neck as directed by your health care provider. °· Keep all follow-up visits as directed by your health care provider. This is important. °SEEK MEDICAL CARE IF: °· You develop a fever. °SEEK IMMEDIATE MEDICAL CARE IF: °· You develop difficulty breathing. °· You develop noisy breathing (stridor). °· You start drooling. °· You have trouble swallowing or have pain with swallowing. °· You develop numbness or weakness in your hands or feet. °· You have changes in your speech, understanding, or vision. °· Your pain gets worse. °  °This information is not intended to replace advice given to you by your health care provider. Make sure you discuss any questions you have with your health care provider. °  °Document Released: 09/04/2000 Document Revised:  01/22/2015 Document Reviewed: 09/03/2014 °Elsevier Interactive Patient Education ©2016 [employer] ° °

## 2015-11-18 NOTE — ED Provider Notes (Signed)
CSN: [id number]     Arrival date & time 11/18/15  7846 History  By signing my name below, I, Helane Gunther, attest that this documentation has been prepared under the direction and in the presence of Orpah Greek, MD. Electronically Signed: Helane Gunther, ED Scribe. 11/18/2015. 3:00 AM.    Chief Complaint  Patient presents with  . Back Pain   The history is provided by the patient. No language interpreter was used.   HPI Comments: Melissa Washington is a 30 y.o. female who presents to the Emergency Department complaining of painful swelling to the posterior neck and back. She reports associated pain and inability to move her neck, lie down, or walk without exacerbation of the pain. She has take ibuprofen without relief. She denies any recent injury.    Past Medical History  Diagnosis Date  . Hypertension   . Nephrolithiasis   . Hepatic steatosis 01/17/2012  . Cholelithiasis 01/17/2012  . Headache(784.0)   . Bacteremia associated with IV line ([place]) 05/01/2013    Port-a-cath infection, removed 8/6 by IR. Ecoli grew out in cultures. Sent home with Ceftriaxone to complete 14 day course.   . Bacteremia due to Escherichia coli 05/01/2013    Port-a-cath infection, removed 8/6 by IR. Ecoli grew out in cultures. Sent home with Ceftriaxone to complete 14 day course.   . 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 Leukemia, acute monocytic, in remission ([place])   . Diabetes mellitus     type 2, insulin  . Collagen vascular disease [facility])    Past Surgical History  Procedure Laterality Date  . Portacath placement      [facility]  . Cholecystectomy  01/31/2012    Procedure: LAPAROSCOPIC CHOLECYSTECTOMY WITH INTRAOPERATIVE CHOLANGIOGRAM;  Surgeon: Joyice Faster. Cornett, MD;  Location: [place] OR;  Service: General;  Laterality: N/A;   Family History  Problem Relation Age of Onset  . Kidney disease Mother   . Hypertension Mother   . Epilepsy Mother   . Sleep apnea Mother   . Kidney disease  Maternal Grandmother   . Diabetes Maternal Grandmother   . Diabetes Father    Social History  Substance Use Topics  . Smoking status: Never Smoker   . Smokeless tobacco: Never Used  . Alcohol Use: No   OB History    Gravida Para Term Preterm AB TAB SAB Ectopic Multiple Living   _0 0 0 0 0 0 0 2     Review of Systems  Musculoskeletal: Positive for back pain and neck pain.  All other systems reviewed and are negative.   Allergies  Ultram  Home Medications   Prior to Admission medications   Medication Sig Start Date End Date Taking? Authorizing Provider  ACCU-CHEK FASTCLIX LANCETS MISC 1 each by Does not apply route 3 (three) times daily. Check sugar 3x daily 06/07/14   Renee A Kuneff, DO  Blood Glucose Monitoring Suppl (ACCU-CHEK NANO SMARTVIEW) W/DEVICE KIT Check blood glucose x3 daily. Must take morning fasting blood glucose 06/07/14   Renee A Kuneff, DO  diazepam (VALIUM) 5 MG tablet Take 1 tablet (5 mg total) by mouth every 6 (six) hours as needed for muscle spasms. 11/18/15   Orpah Greek, MD  fluticasone (FLONASE) 50 MCG/ACT nasal spray Place 2 sprays into both nostrils daily. Patient not taking: Reported on 10/10/2015 09/17/15   Clayton Bibles, PA-C  gabapentin (NEURONTIN) 100 MG capsule Take 1 capsule (100 mg total) by mouth 3 (  three) times daily. Patient not taking: Reported on 09/17/2015 06/19/14   Renee A Kuneff, DO  glucose blood (ACCU-CHEK SMARTVIEW) test strip Check sugar 3 x daily 06/07/14   Renee A Kuneff, DO  ibuprofen (ADVIL,MOTRIN) 800 MG tablet Take 1 tablet (800 mg total) by mouth every 8 (eight) hours as needed for mild pain or moderate pain. Patient not taking: Reported on 10/10/2015 09/17/15   Clayton Bibles, PA-C  insulin detemir (LEVEMIR) 100 UNIT/ML injection Inject 0.2 mLs (20 Units total) into the skin at bedtime. 03/29/15   Patrecia Pour, MD  Insulin Syringe-Needle U-100 (INSULIN SYRINGE 1CC/31GX5/16") 31G X 5/16" 1 ML MISC Use as directed to inject  insulin. 01/20/15   Audelia Hives Presson, PA  lisinopril (PRINIVIL,ZESTRIL) 5 MG tablet Take 1 tablet (5 mg total) by mouth daily. 06/19/14   Renee A Kuneff, DO  metFORMIN (GLUCOPHAGE) 1000 MG tablet Take 1 tablet (1,000 mg total) by mouth 2 (two) times daily with a meal. 03/29/15   Patrecia Pour, MD  naproxen (NAPROSYN) 500 MG tablet Take 1 tablet (500 mg total) by mouth 2 (two) times daily. 11/03/15   Olivia Canter Sam, PA-C  omeprazole (PRILOSEC) 20 MG capsule Take 1 capsule (20 mg total) by mouth daily. 10/10/15   Shari Upstill, PA-C  ondansetron (ZOFRAN ODT) 4 MG disintegrating tablet Take 1 tablet (4 mg total) by mouth every 8 (eight) hours as needed for nausea or vomiting. Patient not taking: Reported on 09/17/2015 05/01/15   Domenic Moras, PA-C  prochlorperazine (COMPAZINE) 10 MG tablet Take 1 tablet (10 mg total) by mouth every 8 (eight) hours as needed for nausea or vomiting (and headache). Patient not taking: Reported on 10/10/2015 09/17/15   Clayton Bibles, PA-C  promethazine (PHENERGAN) 25 MG tablet Take 1 tablet (25 mg total) by mouth every 6 (six) hours as needed for nausea or vomiting. Patient not taking: Reported on 09/17/2015 04/04/15   Elmyra Ricks Pisciotta, PA-C  promethazine (PHENERGAN) 25 MG tablet Take 1 tablet (25 mg total) by mouth every 6 (six) hours as needed for nausea or vomiting. 11/03/15   Olivia Canter Sam, PA-C  pseudoephedrine (SUDAFED) 60 MG tablet Take 1 tablet (60 mg total) by mouth every 6 (six) hours as needed for congestion. Patient not taking: Reported on 10/10/2015 09/17/15   Clayton Bibles, PA-C  terconazole (TERAZOL 7) 0.4 % vaginal cream Place 1 applicator vaginally at bedtime as needed. Patient not taking: Reported on 09/17/2015 03/29/15   Patrecia Pour, MD   BP 127/93 mmHg  Pulse 78  Temp(Src) 98.8 F (37.1 C) (Oral)  Resp 18  Ht '5\' 2"'$  (1.575 m)  Wt 275 lb (124.739 kg)  BMI 50.29 kg/m2  SpO2 99% Physical Exam  Constitutional: She is oriented to person, place, and time. She appears  well-developed and well-nourished. No distress.  HENT:  Head: Normocephalic and atraumatic.  Right Ear: Hearing normal.  Left Ear: Hearing normal.  Nose: Nose normal.  Mouth/Throat: Oropharynx is clear and moist and mucous membranes are normal.  Eyes: Conjunctivae and EOM are normal. Pupils are equal, round, and reactive to light.  Neck: Normal range of motion. Neck supple.  Cardiovascular: Regular rhythm, S1 normal and S2 normal.  Exam reveals no gallop and no friction rub.   No murmur heard. Pulmonary/Chest: Effort normal and breath sounds normal. No respiratory distress. She exhibits no tenderness.  Abdominal: Soft. Normal appearance and bowel sounds are normal. There is no hepatosplenomegaly. There is no tenderness. There is no rebound,  no guarding, no tenderness at McBurney's point and negative Murphy's sign. No hernia.  Musculoskeletal: Normal range of motion. She exhibits tenderness.  Bilateral paraspinal, neck and lumbar TTP  Neurological: She is alert and oriented to person, place, and time. She has normal strength. No cranial nerve deficit or sensory deficit. Coordination normal. GCS eye subscore is 4. GCS verbal subscore is 5. GCS motor subscore is 6.  Skin: Skin is warm, dry and intact. No rash noted. No cyanosis.  Psychiatric: She has a normal mood and affect. Her speech is normal and behavior is normal. Thought content normal.  Nursing note and vitals reviewed.   ED Course  Procedures  DIAGNOSTIC STUDIES: Oxygen Saturation is 98% on RA, normal by my interpretation.    COORDINATION OF CARE: 2:41 AM - Discussed probable muscle spasm and plans to order a muscle relaxant. Pt advised of plan for treatment and pt agrees.  Labs Review Labs Reviewed - No data to display  Imaging Review No results found. I have personally reviewed and evaluated these images and lab results as part of my medical decision-making.   EKG Interpretation None      MDM   Final diagnoses:   Torticollis, acute    Presents to the ER with primary complaint of severe pain in her neck. Patient has spasms of bilateral aspect of the paraspinal muscles causing decreased range of motion and severe pain with movement of her neck. She has not injured herself in any way, does not require imaging. She has normal strength and sensation in upper extremities. There is no neurologic deficit. She has some pain in her lower back as well, some indication of spasm in this area as well, but pain is nonradicular and she has normal neurologic function lower extremities.  I personally performed the services described in this documentation, which was scribed in my presence. The recorded information has been reviewed and is accurate.    Orpah Greek, MD 11/18/15 [phone]

## 2015-12-29 ENCOUNTER — Encounter (HOSPITAL_COMMUNITY): Payer: Self-pay | Admitting: Family Medicine

## 2015-12-29 ENCOUNTER — Emergency Department (HOSPITAL_COMMUNITY)
Admission: EM | Admit: 2015-12-29 | Discharge: 2015-12-29 | Disposition: A | Discharge status: Home / Self Care | Payer: Medicaid Other | Attending: Emergency Medicine | Admitting: Emergency Medicine

## 2015-12-29 DIAGNOSIS — Z3202 Encounter for pregnancy test, result negative: Secondary | ICD-10-CM | POA: Diagnosis not present

## 2015-12-29 DIAGNOSIS — J0110 Acute frontal sinusitis, unspecified: Secondary | ICD-10-CM | POA: Diagnosis not present

## 2015-12-29 DIAGNOSIS — IMO0002 Reserved for concepts with insufficient information to code with codable children: Secondary | ICD-10-CM

## 2015-12-29 DIAGNOSIS — Z87442 Personal history of urinary calculi: Secondary | ICD-10-CM | POA: Diagnosis not present

## 2015-12-29 DIAGNOSIS — Z856 Personal history of leukemia: Secondary | ICD-10-CM | POA: Diagnosis not present

## 2015-12-29 DIAGNOSIS — Z8719 Personal history of other diseases of the digestive system: Secondary | ICD-10-CM | POA: Diagnosis not present

## 2015-12-29 DIAGNOSIS — Z8619 Personal history of other infectious and parasitic diseases: Secondary | ICD-10-CM | POA: Insufficient documentation

## 2015-12-29 DIAGNOSIS — E1165 Type 2 diabetes mellitus with hyperglycemia: Secondary | ICD-10-CM | POA: Diagnosis present

## 2015-12-29 DIAGNOSIS — R739 Hyperglycemia, unspecified: Secondary | ICD-10-CM

## 2015-12-29 DIAGNOSIS — Z79899 Other long term (current) drug therapy: Secondary | ICD-10-CM | POA: Insufficient documentation

## 2015-12-29 DIAGNOSIS — Z7984 Long term (current) use of oral hypoglycemic drugs: Secondary | ICD-10-CM | POA: Insufficient documentation

## 2015-12-29 DIAGNOSIS — I10 Essential (primary) hypertension: Secondary | ICD-10-CM | POA: Diagnosis not present

## 2015-12-29 DIAGNOSIS — Z794 Long term (current) use of insulin: Secondary | ICD-10-CM | POA: Diagnosis not present

## 2015-12-29 DIAGNOSIS — E669 Obesity, unspecified: Secondary | ICD-10-CM | POA: Insufficient documentation

## 2015-12-29 LAB — URINALYSIS, ROUTINE W REFLEX MICROSCOPIC
BILIRUBIN URINE: NEGATIVE
HGB URINE DIPSTICK: NEGATIVE
KETONES UR: 15 mg/dL — AB
Nitrite: POSITIVE — AB
PROTEIN: NEGATIVE mg/dL
Specific Gravity, Urine: 1.022 (ref 1.005–1.030)
pH: 5.5 (ref 5.0–8.0)

## 2015-12-29 LAB — CBC
HCT: 38.4 % (ref 36.0–46.0)
Hemoglobin: 12.8 g/dL (ref 12.0–15.0)
MCH: 27.9 pg (ref 26.0–34.0)
MCHC: 33.3 g/dL (ref 30.0–36.0)
MCV: 83.7 fL (ref 78.0–100.0)
PLATELETS: 247 10*3/uL (ref 150–400)
RBC: 4.59 MIL/uL (ref 3.87–5.11)
RDW: 12.9 % (ref 11.5–15.5)
WBC: 6.9 10*3/uL (ref 4.0–10.5)

## 2015-12-29 LAB — URINE MICROSCOPIC-ADD ON: RBC / HPF: NONE SEEN RBC/hpf (ref 0–5)

## 2015-12-29 LAB — BASIC METABOLIC PANEL
ANION GAP: 12 (ref 5–15)
BUN: 10 mg/dL (ref 6–20)
CHLORIDE: 102 mmol/L (ref 101–111)
CO2: 22 mmol/L (ref 22–32)
CREATININE: 0.76 mg/dL (ref 0.44–1.00)
Calcium: 9.4 mg/dL (ref 8.9–10.3)
GFR calc non Af Amer: >60 mL/min (ref >60)
Glucose, Bld: 345 mg/dL — ABNORMAL HIGH (ref 65–99)
Potassium: 4.2 mmol/L (ref 3.5–5.1)
Sodium: 136 mmol/L (ref 135–145)

## 2015-12-29 LAB — I-STAT BETA HCG BLOOD, ED (MC, WL, AP ONLY): I-stat hCG, quantitative: <5 m[IU]/mL (ref <5)

## 2015-12-29 LAB — CBG MONITORING, ED
GLUCOSE-CAPILLARY: 268 mg/dL — AB (ref 65–99)
Glucose-Capillary: 339 mg/dL — ABNORMAL HIGH (ref 65–99)

## 2015-12-29 MED ORDER — PROCHLORPERAZINE EDISYLATE 5 MG/ML IJ SOLN
10.0000 mg | Freq: Once | INTRAMUSCULAR | Status: AC
  Administered 2015-12-29: 10 mg via INTRAVENOUS
  Filled 2015-12-29: qty 2

## 2015-12-29 MED ORDER — INSULIN PEN NEEDLE 29G X 8MM MISC: Status: DC

## 2015-12-29 MED ORDER — SODIUM CHLORIDE 0.9 % IV BOLUS (SEPSIS)
1000.0000 mL | Freq: Once | INTRAVENOUS | Status: AC
Start: 2015-12-29 — End: 2015-12-29
  Administered 2015-12-29: 1000 mL via INTRAVENOUS

## 2015-12-29 MED ORDER — INSULIN GLARGINE 100 UNIT/ML SOLOSTAR PEN: 20.0000 [IU] | PEN_INJECTOR | Freq: Every day | SUBCUTANEOUS | Status: DC

## 2015-12-29 MED ORDER — AMOXICILLIN 500 MG PO CAPS: 500.0000 mg | ORAL_CAPSULE | Freq: Three times a day (TID) | ORAL | Status: DC

## 2015-12-29 MED ORDER — INSULIN ASPART 100 UNIT/ML ~~LOC~~ SOLN: 4.0000 [IU] | Freq: Once | SUBCUTANEOUS | Status: DC

## 2015-12-29 NOTE — Care Management (Signed)
Patient received consult, concerning medications assistance. Record notes patient has Medicaid, and PCP at [facility] listed. ED CM met with patient at bedside she confirms information. Patient reports that she is no longer able to get insulin pens, and has has run out of insulin early because she has difficulty managing the vials due to vision. Patient also states she has a PCP at [facility] at [address] In [place] but cant remember PCP 's name. CM advised patient to mention this to her PCP and she could get a prior authorization from [facility], patient verbalized understanding teach back was done. No further questions or concerns verbalized  CM updated N. Pisciotta PA-C, of discharge plan and  suggested that patient be given a refill  prescription for lantus pen and pen needles,  Chrislyn RN also updated. No further ED CM needs identified.Marland Kitchen

## 2015-12-29 NOTE — Discharge Instructions (Signed)
Do not hesitate to return to the emergency room for any new, worsening or concerning symptoms.  Please obtain primary care using resource guide below. Let them know that you were seen in the emergency room and that they will need to obtain records for further outpatient management.   [employer] 211 is a great source of information about community services available.  Access by dialing 2-1-1 from anywhere in [place], or by website -  CustodianSupply.fi.   Other Local Resources (Updated 09/2015)  [place]    Phone Number and Address  [place] medical care - 1st and 3rd Saturday of every month  Must not qualify for public or private insurance and must have limited income [phone] [address], [place]  Child care  Emergency assistance for housing and [employer]  Medicaid [phone] [address], [place] [address]   [facility] Department  Low-cost medical care for children, communicable diseases, sexually-transmitted diseases, immunizations, maternity care, womens health and family planning [phone] [address], [place] [address]  [facility] Med[facility]   Medication assistance for [facility] residents  Must meet income requirements [phone] [place], [place].    [place]  Child care  Emergency assistance for housing and [employer]  Medicaid [phone] [address] [place], [place] [address]  [facility] and [place]   Low-cost medical care,   Monday through Friday, 9 am to 6 pm.   Accepts Medicare/Medicaid, and self-pay [phone] [address] [place], [place] [address]  [facility] for [place] care - Monday through Friday, 8:30 am - 5:30 pm  Accepts Medicaid and self-pay [phone] [address] [place], [place] [address]   [facility]  Primary medical care, including for those with sickle cell disease  Accepts Medicare, Medicaid, insurance and self-pay [phone] [place], [place]  [facility]   Primary medical care  Accepts Medicare, [place], insurance and self-pay [phone] [address] [place], [place] [address]   [facility] Department of Social Services  Child care  Emergency assistance for housing and [employer]  Medicaid [phone] [address] [place], [place] [address]  [place] Department of Health and [employer]  Child care  Emergency assistance for housing and [employer]  Medicaid [phone] [place], [place] [address]   [facility] Medication Assistance Program  Medication assistance for [facility] residents with no insurance only  Must have a primary care doctor [phone] [address], [place], [place]  [facility]   Primary medical care  [place], [place], insurance  [phone] [address] [place], [place]  [facility]   Medication assistance [phone]  [facility]   Primary medical care  Accepts Medicare, [place], insurance and self-pay [phone] [address] [place], [place] [address]  [facility]   Primary medical care  Accepts Medicare, [place], insurance and self-pay [phone] [address], [place] [address]  [facility]  For [place] residents between the ages of 69 and 28 who do not have any form of health insurance, Medicare, [place], or [place] benefits.  Services are provided free of charge to uninsured patients who fall within  federal poverty guidelines.    Hours: Tuesdays and Thursdays, 4:15 - 8 pm [phone] [address], [place], [place] [address]  [facility]     Primary medical care  Dental care  Nutritional counseling  Pharmacy  Accepts Medicaid, Medicare, most insurance.  Fees are adjusted based on ability to pay.   Gregory Chacra, [place] [address], [place], [place] [facility], [place], [place] [facility] [place], [place]  [facility]  Womens health and family planning [phone] [place]. [place], [place] care  Emergency assistance for housing and [employer]  Medicaid [phone] [address] [place], [place] [address]   [facility]    Ages 21 and older  Hours: Mondays and Thursdays, 7:00 am - 9:00 am Patients are seen on a first come, first served basis. [phone], ext. [place], [place]  Child care  Emergency assistance for housing and [employer]  Medicaid [phone] [address], [place] [address]  [facility]  Medication assistance  Rental assistance  Food pantry  Medication assistance  Housing assistance  Emergency food distribution  Utility assistance [place], [place]  [address] [place], [place] [address] Hours: Tuesdays and Thursdays from 9am - 12 noon by appointment only  [address], [place] [address]  [facility] [place] private insurance, [place], and [place].  Payment is based on a sliding scale for those without insurance.  Hours: Mondays, Tuesdays and Thursdays, 8:30 am - 5:30 pm.   [phone] [place], [place]  [facility] - Family Medicine at [facility], [place], and [place].  Payment is based on a sliding scale for those without insurance. [phone] [address] [place], [place]  [facility] - Pediatrics at [address], research (medical), [employer], and [place].  Payment is based on a sliding scale for those without insurance [phone] [address], [employer], [place]  [facility] - Pediatrics at [employer], [place], and [place].  Payment is based on a sliding scale for those without insurance. [phone] [place], [place]  [facility] - Pediatrics at [facility], [place], and [place].  Payment is based on a sliding scale for those without insurance. [phone], ext. [phone] [place], [place].    [place] care.  Accepts Medicaid and self-pay. [place], [place]

## 2015-12-29 NOTE — ED Provider Notes (Signed)
CSN: [id number]     Arrival date & time 12/29/15  1211 History   First MD Initiated Contact with Patient 12/29/15 1225     Chief Complaint  Patient presents with  . Hyperglycemia     (Consider location/radiation/quality/duration/timing/severity/associated sxs/prior Treatment) The history is provided by the patient.     Blood pressure 120/85, pulse 89, temperature 98 F (36.7 C), resp. rate 18, SpO2 94 %.  Melissa Washington is a 30 y.o. female with a past medical history of DM2, promyelocytic leukemia in remission, and allergic rhinitis, who presents to the emergency room today for sinus pressure and hyperglycemia. Patient states she has not taken her metformin this morning and has not been taking her insulin due to insurance problems. Yesterday awoke with fever of 101F and constant facial pressure over the maxillary and frontal regions of her head with mild corzya. Has tried 1050m ibuprofen po q6hrs with relief. Presented today because she felt lightheaded and dizzy after not having an appetite since onset. Admits to staying hydrated.  Patient additionally notes symptoms related to chronic problems that are no better or worse on today's presentation that she experiences on a normal basis including: HA with associated photophobia, phonophobia; diffuse abdominal pain with associated loose stools; vertigo lasting 30 minutes upon awakening.    Past Medical History  Diagnosis Date  . Hypertension   . Nephrolithiasis   . Hepatic steatosis 01/17/2012  . Cholelithiasis 01/17/2012  . Headache(784.0)   . [place]    Port-a-cath infection, removed 8/6 by IR. Ecoli grew out in cultures. Sent home with Ceftriaxone to complete 14 day course.   . Bacteremia due to Escherichia coli 05/01/2013    Port-a-cath infection, removed 8/6 by IR. Ecoli grew out in cultures. Sent home with Ceftriaxone to complete 14 day course.   . Acute promyelocytic leukemia     in  remission 04-24-12  . Leukemia, [place] monocytic, in remission [place]   . Diabetes mellitus     type 2, insulin  . Collagen vascular disease ([facility]    Past Surgical History  Procedure Laterality Date  . [facility]      [facility]  [facility]    Procedure: LAPAROSCOPIC CHOLECYSTECTOMY WITH INTRAOPERATIVE CHOLANGIOGRAM;  Surgeon: TJoyice Faster Cornett, MD;  [place];  Service: General;  Laterality: N/A;   Family History  Problem Relation Age of Onset  . Kidney disease Mother   . Hypertension Mother   . Epilepsy Mother   . Sleep apnea Mother   . Kidney disease Maternal Grandmother   . Diabetes Maternal Grandmother   . Diabetes Father    Social History  Substance Use Topics  . Smoking status: Never Smoker   . Smokeless tobacco: Never Used  . Alcohol Use: No   OB History    Gravida Para Term Preterm AB TAB SAB Ectopic Multiple Living   _0 0 0 0 0 0 0 2     Review of Systems  10 systems reviewed and found to be negative, except as noted in the HPI.  Allergies  Ultram  Home Medications   Prior to Admission medications   Medication Sig Start Date End Date Taking? Authorizing Provider  ACCU-CHEK FASTCLIX LANCETS MISC 1 each by Does not apply route 3 (three) times daily. Check sugar 3x daily 06/07/14  Yes Renee A Kuneff, DO  Blood Glucose Monitoring Suppl (ACCU-CHEK NANO SMARTVIEW) W/DEVICE KIT Check blood glucose x3 daily. Must take  morning fasting blood glucose 06/07/14  Yes Renee A Kuneff, DO  diazepam (VALIUM) 5 MG tablet Take 1 tablet (5 mg total) by mouth every 6 (six) hours as needed for muscle spasms. 11/18/15  Yes Orpah Greek, MD  glucose blood (ACCU-CHEK SMARTVIEW) test strip Check sugar 3 x daily 06/07/14  Yes Renee A Kuneff, DO  insulin detemir (LEVEMIR) 100 UNIT/ML injection Inject 0.2 mLs (20 Units total) into the skin at bedtime. 03/29/15  Yes Patrecia Pour, MD  Insulin Syringe-Needle U-100 (INSULIN SYRINGE 1CC/31GX5/16") 31G X 5/16" 1  ML MISC Use as directed to inject insulin. 01/20/15  Yes Audelia Hives Presson, PA  lisinopril (PRINIVIL,ZESTRIL) 5 MG tablet Take 1 tablet (5 mg total) by mouth daily. 06/19/14  Yes Renee A Kuneff, DO  metFORMIN (GLUCOPHAGE) 1000 MG tablet Take 1 tablet (1,000 mg total) by mouth 2 (two) times daily with a meal. 03/29/15  Yes Patrecia Pour, MD   BP 120/85 mmHg  Pulse 89  Temp(Src) 98 F (36.7 C)  Resp 18  SpO2 94% Physical Exam  Constitutional: She is oriented to person, place, and time. She appears well-developed and well-nourished.  Obese, non-toxic.   HENT:  Head: Normocephalic and atraumatic.  Right Ear: Hearing and external ear normal.  Left Ear: Hearing and external ear normal.  Nose: Right sinus exhibits maxillary sinus tenderness and frontal sinus tenderness. Left sinus exhibits maxillary sinus tenderness and frontal sinus tenderness.  Mouth/Throat: Uvula is midline, oropharynx is clear and moist and mucous membranes are normal. No oropharyngeal exudate.  Eyes: Conjunctivae, EOM and lids are normal. Pupils are equal, round, and reactive to light.  Neck: Trachea normal and normal range of motion. Neck supple.  Cardiovascular: Normal rate, regular rhythm, S1 normal, S2 normal and normal heart sounds.   Pulmonary/Chest: Effort normal and breath sounds normal.  Abdominal: Soft. Normal appearance and bowel sounds are normal. She exhibits no mass. There is generalized tenderness (mild). There is no rebound, no guarding and no tenderness at McBurney's point.  Lymphadenopathy:       Head (right side): No submental, no submandibular, no tonsillar, no preauricular, no posterior auricular and no occipital adenopathy present.       Head (left side): No submental, no submandibular, no tonsillar, no preauricular, no posterior auricular and no occipital adenopathy present.    She has no cervical adenopathy.  Neurological: She is alert and oriented to person, place, and time. She has normal strength.  No cranial nerve deficit (2-12 intact) or sensory deficit. She displays a negative Romberg sign. Coordination and gait normal.  Skin: Skin is warm and dry.   ED Course  Procedures (including critical care time) Labs Review Labs Reviewed  CBG MONITORING, ED - Abnormal; Notable for the following:    Glucose-Capillary 339 (*)    All other components within normal limits  CBC  BASIC METABOLIC PANEL  URINALYSIS, ROUTINE W REFLEX MICROSCOPIC (NOT AT [facility])  I-STAT BETA HCG BLOOD, ED ([facility], [facility], [facility] ONLY)    Imaging Review No results found. I have personally reviewed and evaluated these images and lab results as part of my medical decision-making.   EKG Interpretation None      MDM   Final diagnoses:  Hyperglycemia without ketosis  Acute frontal sinusitis, recurrence not specified    Filed Vitals:   12/29/15 1419 12/29/15 1430 12/29/15 1445 12/29/15 1522  BP: 138/101 130/92 136/97   Pulse: 79 80 96   Temp:  Resp: 20   18  SpO2: 99% 99% 98%     Medications  sodium chloride 0.9 % bolus 1,000 mL (0 mLs Intravenous Stopped 12/29/15 1357)  prochlorperazine (COMPAZINE) injection 10 mg (10 mg Intravenous Given 12/29/15 1428)    Liston Alba Davoli is 30 y.o. female presenting with Hyperglycemia and sinus pressure. Patient is having issues obtaining her insulin. Patient reports fever yesterday, she is tenderness to palpation along the frontal maxillary sinuses. Will be started on amoxicillin. Discussed with Mariann Laster case manager states that patient actually does have very good insurance and will write her prescription for her Lantus pens. Sugar has normalized, blood work reassuring. No signs of DKA.  Evaluation does not show pathology that would require ongoing emergent intervention or inpatient treatment. Pt is hemodynamically stable and mentating appropriately. Discussed findings and plan with patient/guardian, who agrees with care plan. All questions answered. Return precautions discussed  and outpatient follow up given.   Discharge Medication List as of 12/29/2015  2:50 PM    START taking these medications   Details  amoxicillin (AMOXIL) 500 MG capsule Take 1 capsule (500 mg total) by mouth 3 (three) times daily., Starting 12/29/2015, Until Discontinued, Print             Monico Blitz, PA-C 12/29/15 [place], MD 12/29/15 [phone]

## 2015-12-29 NOTE — ED Notes (Signed)
Wanda, case manager, at bedside  

## 2015-12-29 NOTE — ED Notes (Signed)
Pt here for hyperglycemia, sinus congestion and sts she feels like she is dehydrated. sts she took metformin yesterday

## 2016-01-29 NOTE — ED Notes (Signed)
Called Pt for triage, no response

## 2016-01-30 ENCOUNTER — Emergency Department (HOSPITAL_COMMUNITY)
Admission: EM | Admit: 2016-01-30 | Discharge: 2016-01-30 | Discharge status: Against Medical Advice | Payer: Medicaid Other | Source: Home / Self Care

## 2016-01-30 ENCOUNTER — Encounter (HOSPITAL_COMMUNITY): Payer: Self-pay | Admitting: *Deleted

## 2016-01-30 ENCOUNTER — Ambulatory Visit (HOSPITAL_COMMUNITY)
Admission: EM | Admit: 2016-01-30 | Discharge: 2016-01-30 | Disposition: A | Discharge status: Home / Self Care | Payer: Medicaid Other | Attending: Family Medicine | Admitting: Family Medicine

## 2016-01-30 DIAGNOSIS — L02415 Cutaneous abscess of right lower limb: Secondary | ICD-10-CM

## 2016-01-30 NOTE — ED Provider Notes (Signed)
CSN: [id number]     Arrival date & time 01/30/16  1918 History   First MD Initiated Contact with Patient 01/30/16 2022     Chief Complaint  Patient presents with  . Abscess   (Consider location/radiation/quality/duration/timing/severity/associated sxs/prior Treatment) Patient is a 30 y.o. female presenting with abscess. The history is provided by the patient.  Abscess Location:  Leg Leg abscess location:  R upper leg Abscess quality: fluctuance, painful, redness and warmth   Red streaking: no   Duration:  5 days Pain details:    Severity:  Mild Chronicity:  Recurrent   Past Medical History  Diagnosis Date  . Hypertension   . Nephrolithiasis   . Hepatic steatosis 01/17/2012  . Cholelithiasis 01/17/2012  . Headache(784.0)   . Bacteremia associated with IV line ([place]) 05/01/2013    Port-a-cath infection, removed 8/6 by IR. Ecoli grew out in cultures. Sent home with Ceftriaxone to complete 14 day course.   . Bacteremia due to Escherichia coli 05/01/2013    Port-a-cath infection, removed 8/6 by IR. Ecoli grew out in cultures. Sent home with Ceftriaxone to complete 14 day course.   . 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 Leukemia, acute monocytic, in remission ([place])   . Diabetes mellitus     type 2, insulin  . Collagen vascular disease [facility])    Past Surgical History  Procedure Laterality Date  . Portacath placement       [facility]  . Cholecystectomy  01/31/2012    Procedure: LAPAROSCOPIC CHOLECYSTECTOMY WITH INTRAOPERATIVE CHOLANGIOGRAM;  Surgeon: Joyice Faster. Cornett, MD;  Location: [place] OR;  Service: General;  Laterality: N/A;   Family History  Problem Relation Age of Onset  . Kidney disease Mother   . Hypertension Mother   . Epilepsy Mother   . Sleep apnea Mother   . Kidney disease Maternal Grandmother   . Diabetes Maternal Grandmother   . Diabetes Father    Social History  Substance Use Topics  . Smoking status: Never Smoker   . Smokeless tobacco: Never Used   . Alcohol Use: No   OB History    Gravida Para Term Preterm AB TAB SAB Ectopic Multiple Living   _0 0 0 0 0 0 0 2     Review of Systems  Constitutional: Negative.   Musculoskeletal: Negative.   Skin: Positive for color change and wound.  All other systems reviewed and are negative.   Allergies  Ultram  Home Medications   Prior to Admission medications   Medication Sig Start Date End Date Taking? Authorizing Provider  ACCU-CHEK FASTCLIX LANCETS MISC 1 each by Does not apply route 3 (three) times daily. Check sugar 3x daily 06/07/14   Renee A Kuneff, DO  amoxicillin (AMOXIL) 500 MG capsule Take 1 capsule (500 mg total) by mouth 3 (three) times daily. 12/29/15   Nicole Pisciotta, PA-C  Blood Glucose Monitoring Suppl (ACCU-CHEK NANO SMARTVIEW) W/DEVICE KIT Check blood glucose x3 daily. Must take morning fasting blood glucose 06/07/14   Renee A Kuneff, DO  diazepam (VALIUM) 5 MG tablet Take 1 tablet (5 mg total) by mouth every 6 (six) hours as needed for muscle spasms. 11/18/15   Orpah Greek, MD  glucose blood (ACCU-CHEK SMARTVIEW) test strip Check sugar 3 x daily 06/07/14   Renee A Kuneff, DO  insulin detemir (LEVEMIR) 100 UNIT/ML injection Inject 0.2 mLs (20 Units total) into the skin at bedtime. 03/29/15   Patrecia Pour, MD  Insulin Glargine (  LANTUS) 100 UNIT/ML Solostar Pen Inject 20 Units into the skin daily at 10 pm. 12/29/15   Elmyra Ricks Pisciotta, PA-C  Insulin Pen Needle 29G X 8MM MISC As directed 12/29/15   Elmyra Ricks Pisciotta, PA-C  Insulin Syringe-Needle U-100 (INSULIN SYRINGE 1CC/31GX5/16") 31G X 5/16" 1 ML MISC Use as directed to inject insulin. 01/20/15   Audelia Hives Presson, PA  lisinopril (PRINIVIL,ZESTRIL) 5 MG tablet Take 1 tablet (5 mg total) by mouth daily. 06/19/14   Renee A Kuneff, DO  metFORMIN (GLUCOPHAGE) 1000 MG tablet Take 1 tablet (1,000 mg total) by mouth 2 (two) times daily with a meal. 03/29/15   Patrecia Pour, MD   Meds Ordered and Administered this Visit   Medications - No data to display  BP 132/76 mmHg  Pulse 78  Temp(Src) 98.6 F (37 C) (Oral)  Resp 18  SpO2 100% No data found.   Physical Exam  Constitutional: She is oriented to person, place, and time. She appears well-developed and well-nourished. No distress.  Neurological: She is oriented to person, place, and time.  Skin: Skin is warm and dry. Rash noted. There is erythema.  16m erythematous pustular lesion to right prox thigh,  Nursing note and vitals reviewed.   ED Course  ..Marland Kitchenncision and Drainage Date/Time: 01/30/2016 8:57 PM Performed by: KBilly FischerAuthorized by: KIhor GullyD Consent: Verbal consent obtained. Consent given by: patient Type: abscess Body area: lower extremity Location details: right leg Local anesthetic: topical anesthetic Patient sedated: no Scalpel size: 11 Incision type: single straight Incision depth: dermal Complexity: simple Drainage: purulent Drainage amount: scant Wound treatment: wound left open Patient tolerance: Patient tolerated the procedure well with no immediate complications   (including critical care time)  Labs Review Labs Reviewed - No data to display  Imaging Review No results found.   Visual Acuity Review  Right Eye Distance:   Left Eye Distance:   Bilateral Distance:    Right Eye Near:   Left Eye Near:    Bilateral Near:         MDM   1. Abscess of right thigh        JBilly Fischer MD 01/30/16 2101

## 2016-01-30 NOTE — ED Notes (Signed)
At bedside for i/d of abscess to right inner thigh with dr Juventino Slovak

## 2016-01-30 NOTE — ED Notes (Signed)
Called Pt for triage, no response

## 2016-01-30 NOTE — Discharge Instructions (Signed)
Warm soak and bacitracin ointment twice a day thru weekend, return if any problems

## 2016-01-30 NOTE — ED Notes (Signed)
Pt  Reports  A  Boil  In the  Inner  Upper  r  Thigh      X  5  Days    -  Pt  Reports     Has  Had     Boils  In the  Past    The  Pt is  A  Diabetic  Takes  lantus

## 2016-02-03 ENCOUNTER — Encounter (HOSPITAL_COMMUNITY): Payer: Self-pay | Admitting: Emergency Medicine

## 2016-02-03 ENCOUNTER — Emergency Department (HOSPITAL_COMMUNITY)
Admission: EM | Admit: 2016-02-03 | Discharge: 2016-02-04 | Disposition: A | Discharge status: Home / Self Care | Payer: Medicaid Other | Attending: Emergency Medicine | Admitting: Emergency Medicine

## 2016-02-03 DIAGNOSIS — R238 Other skin changes: Secondary | ICD-10-CM | POA: Diagnosis not present

## 2016-02-03 DIAGNOSIS — Z8739 Personal history of other diseases of the musculoskeletal system and connective tissue: Secondary | ICD-10-CM | POA: Diagnosis not present

## 2016-02-03 DIAGNOSIS — Z7984 Long term (current) use of oral hypoglycemic drugs: Secondary | ICD-10-CM | POA: Insufficient documentation

## 2016-02-03 DIAGNOSIS — Z856 Personal history of leukemia: Secondary | ICD-10-CM | POA: Diagnosis not present

## 2016-02-03 DIAGNOSIS — Z8719 Personal history of other diseases of the digestive system: Secondary | ICD-10-CM | POA: Insufficient documentation

## 2016-02-03 DIAGNOSIS — Z87442 Personal history of urinary calculi: Secondary | ICD-10-CM | POA: Insufficient documentation

## 2016-02-03 DIAGNOSIS — E119 Type 2 diabetes mellitus without complications: Secondary | ICD-10-CM | POA: Insufficient documentation

## 2016-02-03 DIAGNOSIS — I10 Essential (primary) hypertension: Secondary | ICD-10-CM | POA: Diagnosis not present

## 2016-02-03 DIAGNOSIS — Z79899 Other long term (current) drug therapy: Secondary | ICD-10-CM | POA: Diagnosis not present

## 2016-02-03 DIAGNOSIS — Z792 Long term (current) use of antibiotics: Secondary | ICD-10-CM | POA: Diagnosis not present

## 2016-02-03 DIAGNOSIS — L02415 Cutaneous abscess of right lower limb: Secondary | ICD-10-CM | POA: Diagnosis present

## 2016-02-03 DIAGNOSIS — Z794 Long term (current) use of insulin: Secondary | ICD-10-CM | POA: Insufficient documentation

## 2016-02-03 MED ORDER — LIDOCAINE-EPINEPHRINE 1 %-1:100000 IJ SOLN
10.0000 mL | Freq: Once | INTRAMUSCULAR | Status: AC
  Administered 2016-02-04: 10 mL
  Filled 2016-02-03: qty 1

## 2016-02-03 NOTE — ED Notes (Signed)
Pt. reports worsening right groin abscess onset 2 days ago with no drainage .

## 2016-02-03 NOTE — ED Provider Notes (Signed)
CSN: [id number]     Arrival date & time 02/03/16  2012 History   First MD Initiated Contact with Patient 02/03/16 2314     Chief Complaint  Patient presents with  . Abscess     (Consider location/radiation/quality/duration/timing/severity/associated sxs/prior Treatment) HPI Comments: 30 year old female presents to the emergency department for evaluation of abscess. Patient states that abscess began one week ago. Symptoms constant with mild worsening. Patient was seen in urgent care recently and had the area incised with little drainage. She has been applying warm compresses without relief. Ibuprofen has not been significantly helping discomfort. Patient reports a history of similar symptoms which usually resolve after adequate drainage. She has had no fevers or red streaking down her leg.  Patient is a 30 y.o. female presenting with abscess. The history is provided by the patient. No language interpreter was used.  Abscess   Past Medical History  Diagnosis Date  . Hypertension   . Nephrolithiasis   . Hepatic steatosis 01/17/2012  . Cholelithiasis 01/17/2012  . Headache(784.0)   . Bacteremia associated with IV line ([place]) 05/01/2013    Port-a-cath infection, removed 8/6 by IR. Ecoli grew out in cultures. Sent home with Ceftriaxone to complete 14 day course.   . Bacteremia due to Escherichia coli 05/01/2013    Port-a-cath infection, removed 8/6 by IR. Ecoli grew out in cultures. Sent home with Ceftriaxone to complete 14 day course.   . 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 Leukemia, acute monocytic, in remission ([place])   . Diabetes mellitus     type 2, insulin  . Collagen vascular disease [facility])    Past Surgical History  Procedure Laterality Date  . Portacath placement      [facility]  . Cholecystectomy  01/31/2012    Procedure: LAPAROSCOPIC CHOLECYSTECTOMY WITH INTRAOPERATIVE CHOLANGIOGRAM;  Surgeon: Joyice Faster. Cornett, MD;  Location: [place] OR;  Service: General;  Laterality:  N/A;   Family History  Problem Relation Age of Onset  . Kidney disease Mother   . Hypertension Mother   . Epilepsy Mother   . Sleep apnea Mother   . Kidney disease Maternal Grandmother   . Diabetes Maternal Grandmother   . Diabetes Father    Social History  Substance Use Topics  . Smoking status: Never Smoker   . Smokeless tobacco: Never Used  . Alcohol Use: No   OB History    Gravida Para Term Preterm AB TAB SAB Ectopic Multiple Living   '2 2 2 ' 0 0 0 0 0 0 2      Review of Systems  Musculoskeletal: Positive for myalgias.  Skin: Positive for wound.  All other systems reviewed and are negative.   Allergies  Ultram  Home Medications   Prior to Admission medications   Medication Sig Start Date End Date Taking? Authorizing Provider  ACCU-CHEK FASTCLIX LANCETS MISC 1 each by Does not apply route 3 (three) times daily. Check sugar 3x daily 06/07/14  Yes Renee A Kuneff, DO  Blood Glucose Monitoring Suppl (ACCU-CHEK NANO SMARTVIEW) W/DEVICE KIT Check blood glucose x3 daily. Must take morning fasting blood glucose 06/07/14  Yes Renee A Kuneff, DO  glucose blood (ACCU-CHEK SMARTVIEW) test strip Check sugar 3 x daily 06/07/14  Yes Renee A Kuneff, DO  insulin detemir (LEVEMIR) 100 UNIT/ML injection Inject 0.2 mLs (20 Units total) into the skin at bedtime. 03/29/15  Yes Patrecia Pour, MD  Insulin Glargine (LANTUS) 100 UNIT/ML Solostar Pen Inject 20 Units into the  skin daily at 10 pm. 12/29/15  Yes Nicole Pisciotta, PA-C  Insulin Pen Needle 29G X 8MM MISC As directed 12/29/15  Yes Nicole Pisciotta, PA-C  Insulin Syringe-Needle U-100 (INSULIN SYRINGE 1CC/31GX5/16") 31G X 5/16" 1 ML MISC Use as directed to inject insulin. 01/20/15  Yes Audelia Hives Presson, PA  lisinopril (PRINIVIL,ZESTRIL) 5 MG tablet Take 1 tablet (5 mg total) by mouth daily. 06/19/14  Yes Renee A Kuneff, DO  metFORMIN (GLUCOPHAGE) 1000 MG tablet Take 1 tablet (1,000 mg total) by mouth 2 (two) times daily with a meal. 03/29/15  Yes  Patrecia Pour, MD  amoxicillin (AMOXIL) 500 MG capsule Take 1 capsule (500 mg total) by mouth 3 (three) times daily. 12/29/15   Nicole Pisciotta, PA-C  diazepam (VALIUM) 5 MG tablet Take 1 tablet (5 mg total) by mouth every 6 (six) hours as needed for muscle spasms. 11/18/15   Orpah Greek, MD  sulfamethoxazole-trimethoprim (BACTRIM DS,SEPTRA DS) 800-160 MG tablet Take 1 tablet by mouth 2 (two) times daily. 02/04/16 02/11/16  Antonietta Breach, PA-C   BP 154/89 mmHg  Pulse 94  Temp(Src) 98 F (36.7 C) (Oral)  Resp 16  Ht '5\' 2"'  (1.575 m)  Wt 126.355 kg  BMI 50.94 kg/m2  SpO2 96%   Physical Exam  Constitutional: She is oriented to person, place, and time. She appears well-developed and well-nourished. No distress.  Nontoxic appearing  HENT:  Head: Normocephalic and atraumatic.  Eyes: Conjunctivae and EOM are normal. No scleral icterus.  Neck: Normal range of motion.  Pulmonary/Chest: Effort normal. No respiratory distress.  Respirations even and unlabored  Musculoskeletal: Normal range of motion.       Right hip: She exhibits tenderness and swelling. She exhibits normal strength, no crepitus and no deformity.       Legs: Neurological: She is alert and oriented to person, place, and time. She exhibits normal muscle tone. Coordination normal.  GCS 15. Patient moving all extremities.  Skin: Skin is warm and dry. No rash noted. She is not diaphoretic. No erythema. No pallor.  Psychiatric: She has a normal mood and affect. Her behavior is normal.  Nursing note and vitals reviewed.   ED Course  Procedures (including critical care time) Labs Review Labs Reviewed - No data to display  Imaging Review No results found.   I have personally reviewed and evaluated these images and lab results as part of my medical decision-making.   EKG Interpretation None      INCISION AND DRAINAGE Performed by: Antonietta Breach Consent: Verbal consent obtained. Risks and benefits: risks, benefits and  alternatives were discussed Type: abscess  Body area: R inner thigh  Anesthesia: local infiltration  Incision was made with a scalpel.  Local anesthetic: lidocaine 2% with epinephrine  Anesthetic total: 5 ml  Complexity: complex Blunt dissection to break up loculations  Drainage: purulent and bloody  Drainage amount: scant  Packing material: none  Patient tolerance: Patient tolerated the procedure well with no immediate complications.    MDM   Final diagnoses:  Papule of skin    Patient with skin papule; questionable abscess given hx of same. Scant drainage expelled. Question papule secondary to ingrown hair. Encouraged home warm soaks and flushing. Will place on 1 week course of Bactrim given history. Will d/c to home. PCP f/u recommended for wound recheck. No indication for further emergent work up. Patient discharged in good condition with no unaddressed concerns.     Antonietta Breach, PA-C 02/04/16 [place]  Audie Pinto, MD 02/04/16 1505

## 2016-02-03 NOTE — ED Notes (Signed)
EDP at bedside  

## 2016-02-04 MED ORDER — SULFAMETHOXAZOLE-TRIMETHOPRIM 800-160 MG PO TABS: 1.0000 | ORAL_TABLET | Freq: Two times a day (BID) | ORAL | Status: AC

## 2016-02-04 NOTE — Discharge Instructions (Signed)
Take Bactrim as prescribed. You may continue taking ibuprofen or Tylenol for pain control. Apply warm compresses 3-4 times per day. Follow-up with your primary care doctor for wound recheck.  Abscess An abscess is an infected area that contains a collection of pus and debris.It can occur in almost any part of the body. An abscess is also known as a furuncle or boil. CAUSES  An abscess occurs when tissue gets infected. This can occur from blockage of oil or sweat glands, infection of hair follicles, or a minor injury to the skin. As the body tries to fight the infection, pus collects in the area and creates pressure under the skin. This pressure causes pain. People with weakened immune systems have difficulty fighting infections and get certain abscesses more often.  SYMPTOMS Usually an abscess develops on the skin and becomes a painful mass that is red, warm, and tender. If the abscess forms under the skin, you may feel a moveable soft area under the skin. Some abscesses break open (rupture) on their own, but most will continue to get worse without care. The infection can spread deeper into the body and eventually into the bloodstream, causing you to feel ill.  DIAGNOSIS  Your caregiver will take your medical history and perform a physical exam. A sample of fluid may also be taken from the abscess to determine what is causing your infection. TREATMENT  Your caregiver may prescribe antibiotic medicines to fight the infection. However, taking antibiotics alone usually does not cure an abscess. Your caregiver may need to make a small cut (incision) in the abscess to drain the pus. In some cases, gauze is packed into the abscess to reduce pain and to continue draining the area. HOME CARE INSTRUCTIONS   Only take over-the-counter or prescription medicines for pain, discomfort, or fever as directed by your caregiver.  If you were prescribed antibiotics, take them as directed. Finish them even if you start  to feel better.  If gauze is used, follow your caregiver's directions for changing the gauze.  To avoid spreading the infection:  Keep your draining abscess covered with a bandage.  Wash your hands well.  Do not share personal care items, towels, or whirlpools with others.  Avoid skin contact with others.  Keep your skin and clothes clean around the abscess.  Keep all follow-up appointments as directed by your caregiver. SEEK MEDICAL CARE IF:   You have increased pain, swelling, redness, fluid drainage, or bleeding.  You have muscle aches, chills, or a general ill feeling.  You have a fever. MAKE SURE YOU:   Understand these instructions.  Will watch your condition.  Will get help right away if you are not doing well or get worse.   This information is not intended to replace advice given to you by your health care provider. Make sure you discuss any questions you have with your health care provider.   Document Released: 06/17/2005 Document Revised: 03/08/2012 Document Reviewed: 11/20/2011 Elsevier Interactive Patient Education [employer].

## 2016-02-14 ENCOUNTER — Encounter (HOSPITAL_COMMUNITY): Payer: Self-pay | Admitting: Emergency Medicine

## 2016-02-14 ENCOUNTER — Ambulatory Visit (HOSPITAL_COMMUNITY)
Admission: EM | Admit: 2016-02-14 | Discharge: 2016-02-14 | Disposition: A | Discharge status: Home / Self Care | Payer: Medicaid Other | Attending: Emergency Medicine | Admitting: Emergency Medicine

## 2016-02-14 DIAGNOSIS — R519 Headache, unspecified: Secondary | ICD-10-CM

## 2016-02-14 DIAGNOSIS — R51 Headache: Secondary | ICD-10-CM

## 2016-02-14 MED ORDER — DEXAMETHASONE SODIUM PHOSPHATE 10 MG/ML IJ SOLN
10.0000 mg | Freq: Once | INTRAMUSCULAR | Status: AC
  Administered 2016-02-14: 10 mg via INTRAMUSCULAR

## 2016-02-14 MED ORDER — TOPIRAMATE 25 MG PO TABS: ORAL_TABLET | ORAL | Status: DC

## 2016-02-14 MED ORDER — KETOROLAC TROMETHAMINE 60 MG/2ML IM SOLN
INTRAMUSCULAR | Status: AC
  Filled 2016-02-14: qty 2

## 2016-02-14 MED ORDER — DEXAMETHASONE SODIUM PHOSPHATE 10 MG/ML IJ SOLN
INTRAMUSCULAR | Status: AC
  Filled 2016-02-14: qty 1

## 2016-02-14 MED ORDER — KETOROLAC TROMETHAMINE 60 MG/2ML IM SOLN
60.0000 mg | Freq: Once | INTRAMUSCULAR | Status: AC
  Administered 2016-02-14: 60 mg via INTRAMUSCULAR

## 2016-02-14 NOTE — Discharge Instructions (Signed)
I suspect your headaches are coming from undiagnosed sleep apnea. We gave you some medicine to help today. Please take Topamax 1 tablet at bedtime for the next week, then increase to 2 tablets at bedtime for another week. Try to limit Tylenol and ibuprofen use as much as you can. Please make an appointment with your primary care doctor in the next month. I think you would benefit from a sleep study.

## 2016-02-14 NOTE — ED Notes (Signed)
C/o constant HA onset x1-2 weeks... Taking ibup/tyle/naproxen w/no relief.  Did not go to work due to pain A&O x4... No acute distress.

## 2016-02-14 NOTE — ED Provider Notes (Signed)
CSN: [id number]     Arrival date & time 02/14/16  1917 History   First MD Initiated Contact with Patient 02/14/16 1922     Chief Complaint  Patient presents with  . Headache   (Consider location/radiation/quality/duration/timing/severity/associated sxs/prior Treatment) HPI  She is a 30 year old woman here for evaluation of headache. She states for the last 1-1/2-2 weeks she has had a fairly constant headache. It is primarily across her forehead and temples. She reports it is a sharp tension type headache. No associated nausea or vomiting. She states it comes and goes, but never completely resolves. It does ease up with Tylenol or ibuprofen. She denies any change in her vision. She states every once in a while she will have some spots in her vision when the headache gets bad. She states when the headache it bad, it makes it hard for her to concentrate. She states the headache is typically present in the morning. During the day, it tends to improve with lying down. She does report a history of snoring. She states her fianc told her this is been worse over the last month. She does not wake up feeling rested. She states she tries not to take Tylenol or ibuprofen, but ends up taking it on most days.  Past Medical History  Diagnosis Date  . Hypertension   . Nephrolithiasis   . Hepatic steatosis 01/17/2012  . Cholelithiasis 01/17/2012  . Headache(784.0)   . Bacteremia associated with IV line ([place]) 05/01/2013    Port-a-cath infection, removed 8/6 by IR. Ecoli grew out in cultures. Sent home with Ceftriaxone to complete 14 day course.   . Bacteremia due to Escherichia coli 05/01/2013    Port-a-cath infection, removed 8/6 by IR. Ecoli grew out in cultures. Sent home with Ceftriaxone to complete 14 day course.   . 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 Leukemia, acute monocytic, in remission ([place])   . Diabetes mellitus     type 2, insulin  . Collagen vascular disease [facility])    Past  Surgical History  Procedure Laterality Date  . Portacath placement      [facility]  . Cholecystectomy  01/31/2012    Procedure: LAPAROSCOPIC CHOLECYSTECTOMY WITH INTRAOPERATIVE CHOLANGIOGRAM;  Surgeon: Joyice Faster. Cornett, MD;  Location: [facility] OR;  Service: General;  Laterality: N/A;   Family History  Problem Relation Age of Onset  . Kidney disease Mother   . Hypertension Mother   . Epilepsy Mother   . Sleep apnea Mother   . Kidney disease Maternal Grandmother   . Diabetes Maternal Grandmother   . Diabetes Father    Social History  Substance Use Topics  . Smoking status: Never Smoker   . Smokeless tobacco: Never Used  . Alcohol Use: No   OB History    Gravida Para Term Preterm AB TAB SAB Ectopic Multiple Living   '2 2 2 ' 0 0 0 0 0 0 2     Review of Systems As in history of present illness Allergies  Ultram  Home Medications   Prior to Admission medications   Medication Sig Start Date End Date Taking? Authorizing Provider  insulin detemir (LEVEMIR) 100 UNIT/ML injection Inject 0.2 mLs (20 Units total) into the skin at bedtime. 03/29/15  Yes Patrecia Pour, MD  Insulin Glargine (LANTUS) 100 UNIT/ML Solostar Pen Inject 20 Units into the skin daily at 10 pm. 12/29/15  Yes Nicole Pisciotta, PA-C  lisinopril (PRINIVIL,ZESTRIL) 5 MG tablet Take 1 tablet (5 mg  total) by mouth daily. 06/19/14  Yes Renee A Kuneff, DO  metFORMIN (GLUCOPHAGE) 1000 MG tablet Take 1 tablet (1,000 mg total) by mouth 2 (two) times daily with a meal. 03/29/15  Yes Patrecia Pour, MD  ACCU-CHEK FASTCLIX LANCETS MISC 1 each by Does not apply route 3 (three) times daily. Check sugar 3x daily 06/07/14   Renee A Kuneff, DO  Blood Glucose Monitoring Suppl (ACCU-CHEK NANO SMARTVIEW) W/DEVICE KIT Check blood glucose x3 daily. Must take morning fasting blood glucose 06/07/14   Renee A Kuneff, DO  diazepam (VALIUM) 5 MG tablet Take 1 tablet (5 mg total) by mouth every 6 (six) hours as needed for muscle spasms. 11/18/15   Orpah Greek,  MD  glucose blood (ACCU-CHEK SMARTVIEW) test strip Check sugar 3 x daily 06/07/14   Renee A Kuneff, DO  Insulin Pen Needle 29G X 8MM MISC As directed 12/29/15   Elmyra Ricks Pisciotta, PA-C  Insulin Syringe-Needle U-100 (INSULIN SYRINGE 1CC/31GX5/16") 31G X 5/16" 1 ML MISC Use as directed to inject insulin. 01/20/15   Audelia Hives Presson, PA  topiramate (TOPAMAX) 25 MG tablet Take one tablet at bedtime for 1 week, then 2 tablets at bedtime for another week. 02/14/16   Melony Overly, MD   Meds Ordered and Administered this Visit   Medications  ketorolac (TORADOL) injection 60 mg (60 mg Intramuscular Given 02/14/16 2013)  dexamethasone (DECADRON) injection 10 mg (10 mg Intramuscular Given 02/14/16 2013)    BP 139/93 mmHg  Pulse 86  Temp(Src) 98.1 F (36.7 C) (Oral)  Resp 20  SpO2 96% No data found.   Physical Exam  Constitutional: She is oriented to person, place, and time. She appears well-developed and well-nourished. No distress.  Obese  Eyes: EOM are normal. Pupils are equal, round, and reactive to light.  Neck: Neck supple.  Cardiovascular: Normal rate.   Pulmonary/Chest: Effort normal.  Neurological: She is alert and oriented to person, place, and time. No cranial nerve deficit. She exhibits normal muscle tone. Coordination normal.    ED Course  Procedures (including critical care time)  Labs Review Labs Reviewed - No data to display  Imaging Review No results found.    MDM   1. Headache, unspecified headache type    Headache is concerning for medication rebound versus sleep apnea related. No red flags on history or exam. We'll treat acutely with Toradol and Decadron. I've given a prescription for a 2 week course of Topamax. Recommended follow-up with her PCP in the next month for a sleep study.    Melony Overly, MD 02/14/16 2014

## 2016-02-16 ENCOUNTER — Emergency Department (HOSPITAL_COMMUNITY): Payer: Medicaid Other

## 2016-02-16 ENCOUNTER — Emergency Department (HOSPITAL_COMMUNITY)
Admission: EM | Admit: 2016-02-16 | Discharge: 2016-02-16 | Disposition: A | Discharge status: Home / Self Care | Payer: Medicaid Other | Attending: Emergency Medicine | Admitting: Emergency Medicine

## 2016-02-16 ENCOUNTER — Encounter (HOSPITAL_COMMUNITY): Payer: Self-pay

## 2016-02-16 DIAGNOSIS — R519 Headache, unspecified: Secondary | ICD-10-CM

## 2016-02-16 DIAGNOSIS — I10 Essential (primary) hypertension: Secondary | ICD-10-CM | POA: Insufficient documentation

## 2016-02-16 DIAGNOSIS — E669 Obesity, unspecified: Secondary | ICD-10-CM | POA: Insufficient documentation

## 2016-02-16 DIAGNOSIS — R51 Headache: Secondary | ICD-10-CM | POA: Diagnosis not present

## 2016-02-16 DIAGNOSIS — Z794 Long term (current) use of insulin: Secondary | ICD-10-CM | POA: Diagnosis not present

## 2016-02-16 DIAGNOSIS — Z7984 Long term (current) use of oral hypoglycemic drugs: Secondary | ICD-10-CM | POA: Insufficient documentation

## 2016-02-16 DIAGNOSIS — Z79899 Other long term (current) drug therapy: Secondary | ICD-10-CM | POA: Insufficient documentation

## 2016-02-16 DIAGNOSIS — E119 Type 2 diabetes mellitus without complications: Secondary | ICD-10-CM | POA: Diagnosis not present

## 2016-02-16 MED ORDER — DIPHENHYDRAMINE HCL 50 MG/ML IJ SOLN
25.0000 mg | Freq: Once | INTRAMUSCULAR | Status: AC
  Administered 2016-02-16: 25 mg via INTRAVENOUS
  Filled 2016-02-16: qty 1

## 2016-02-16 MED ORDER — KETOROLAC TROMETHAMINE 30 MG/ML IJ SOLN
30.0000 mg | Freq: Once | INTRAMUSCULAR | Status: AC
Start: 2016-02-16 — End: 2016-02-16
  Administered 2016-02-16: 30 mg via INTRAVENOUS
  Filled 2016-02-16: qty 1

## 2016-02-16 MED ORDER — FENTANYL CITRATE (PF) 100 MCG/2ML IJ SOLN
50.0000 ug | Freq: Once | INTRAMUSCULAR | Status: AC
  Administered 2016-02-16: 50 ug via INTRAVENOUS
  Filled 2016-02-16: qty 2

## 2016-02-16 MED ORDER — SODIUM CHLORIDE 0.9 % IV BOLUS (SEPSIS)
1000.0000 mL | Freq: Once | INTRAVENOUS | Status: AC
  Administered 2016-02-16: 1000 mL via INTRAVENOUS

## 2016-02-16 MED ORDER — DEXAMETHASONE SODIUM PHOSPHATE 10 MG/ML IJ SOLN
10.0000 mg | Freq: Once | INTRAMUSCULAR | Status: AC
Start: 2016-02-16 — End: 2016-02-16
  Administered 2016-02-16: 10 mg via INTRAVENOUS
  Filled 2016-02-16: qty 1

## 2016-02-16 MED ORDER — METOCLOPRAMIDE HCL 5 MG/ML IJ SOLN
10.0000 mg | Freq: Once | INTRAMUSCULAR | Status: AC
  Administered 2016-02-16: 10 mg via INTRAVENOUS
  Filled 2016-02-16: qty 2

## 2016-02-16 NOTE — ED Notes (Signed)
Patient here with complaint of awakening in mornings with headache x 2 weeks. Seen at urgent care and received injection and headaches are persisting

## 2016-02-16 NOTE — Discharge Instructions (Signed)
Please call to schedule a neurology follow up appointment as soon as possible. Please also get the sleep study that you had discussed with the urgent care provider. Return to the emergency room for new or worsening symptoms.

## 2016-02-16 NOTE — ED Notes (Signed)
Pain med given pt already sleepy

## 2016-02-16 NOTE — ED Notes (Addendum)
has had h/a x 2 1/2 weeks saw [facility] and  Was given meds topamax ( did not get it filled) and was told she needed a sleep study ? due to sleep apnea and given a shot but pt states h/a came back after 45 mins.  occ blurred vision and diizzines states does not happen all the time

## 2016-02-16 NOTE — ED Provider Notes (Signed)
CSN: [id number]     Arrival date & time 02/16/16  1300 History   First MD Initiated Contact with Patient 02/16/16 1319     Chief Complaint  Patient presents with  . Headache    HPI   Melissa Washington is an 30 y.o. female with history of AML (in remission since 2014), HTN, OSA, chronic headaches, DM who presents to the ED for evaluation of a headache. She states she has a long history of headaches that typically resolve on their own. She states that for the past two weeks she has had a gradual onset, constant, dull, throbbing headache that starts frontally and radiates to her bilateral temples. Denies thunderclap onset. She states this is typically where here headaches are but it is more intense than usual and has not resolved. She states the pain is there from the time she wakes up until the time she falls asleep, though the headaches do not wake her from sleep. She has tried tylenol and ibuprofen daily with no relief; none today. She states she was seen at [facility] several days ago and was told her headache is likely due to medication rebound or her sleep apnea. She is supposed to go for a sleep study. She denies fever, chills, new numbness, weakness, tingling. Denies n/v, photophobia, or phonophobia. She is not on any contraceptives. She states occasionally she will see black spots when the pain is severe but denies other blurred vision, flashers/floaters, or other visual complaints at this time. She has never had any head imaging and has never seen neurology for evaluation.  Past Medical History  Diagnosis Date  . Hypertension   . Nephrolithiasis   . Hepatic steatosis 01/17/2012  . Cholelithiasis 01/17/2012  . Headache(784.0)   . Bacteremia associated with IV line ([place]) 05/01/2013    Port-a-cath infection, removed 8/6 by IR. Ecoli grew out in cultures. Sent home with Ceftriaxone to complete 14 day course.   . Bacteremia due to Escherichia coli 05/01/2013    Port-a-cath infection, removed 8/6 by IR.  Ecoli grew out in cultures. Sent home with Ceftriaxone to complete 14 day course.   . 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 Leukemia, acute monocytic, in remission ([place])   . Diabetes mellitus     type 2, insulin  . Collagen vascular disease [facility])    Past Surgical History  Procedure Laterality Date  . Portacath placement      [facility]  . Cholecystectomy  01/31/2012    Procedure: LAPAROSCOPIC CHOLECYSTECTOMY WITH INTRAOPERATIVE CHOLANGIOGRAM;  Surgeon: Joyice Faster. Cornett, MD;  Location: [facility] OR;  Service: General;  Laterality: N/A;   Family History  Problem Relation Age of Onset  . Kidney disease Mother   . Hypertension Mother   . Epilepsy Mother   . Sleep apnea Mother   . Kidney disease Maternal Grandmother   . Diabetes Maternal Grandmother   . Diabetes Father    Social History  Substance Use Topics  . Smoking status: Never Smoker   . Smokeless tobacco: Never Used  . Alcohol Use: No   OB History    Gravida Para Term Preterm AB TAB SAB Ectopic Multiple Living   '2 2 2 ' 0 0 0 0 0 0 2     Review of Systems  All other systems reviewed and are negative.     Allergies  Ultram  Home Medications   Prior to Admission medications   Medication Sig Start Date End Date Taking? Authorizing Provider  ACCU-CHEK FASTCLIX LANCETS MISC 1 each by Does not apply route 3 (three) times daily. Check sugar 3x daily 06/07/14   Renee A Kuneff, DO  Blood Glucose Monitoring Suppl (ACCU-CHEK NANO SMARTVIEW) W/DEVICE KIT Check blood glucose x3 daily. Must take morning fasting blood glucose 06/07/14   Renee A Kuneff, DO  diazepam (VALIUM) 5 MG tablet Take 1 tablet (5 mg total) by mouth every 6 (six) hours as needed for muscle spasms. 11/18/15   Orpah Greek, MD  glucose blood (ACCU-CHEK SMARTVIEW) test strip Check sugar 3 x daily 06/07/14   Renee A Kuneff, DO  insulin detemir (LEVEMIR) 100 UNIT/ML injection Inject 0.2 mLs (20 Units total) into the skin at bedtime. 03/29/15   Patrecia Pour, MD  Insulin Glargine (LANTUS) 100 UNIT/ML Solostar Pen Inject 20 Units into the skin daily at 10 pm. 12/29/15   Elmyra Ricks Pisciotta, PA-C  Insulin Pen Needle 29G X 8MM MISC As directed 12/29/15   Elmyra Ricks Pisciotta, PA-C  Insulin Syringe-Needle U-100 (INSULIN SYRINGE 1CC/31GX5/16") 31G X 5/16" 1 ML MISC Use as directed to inject insulin. 01/20/15   Audelia Hives Presson, PA  lisinopril (PRINIVIL,ZESTRIL) 5 MG tablet Take 1 tablet (5 mg total) by mouth daily. 06/19/14   Renee A Kuneff, DO  metFORMIN (GLUCOPHAGE) 1000 MG tablet Take 1 tablet (1,000 mg total) by mouth 2 (two) times daily with a meal. 03/29/15   Patrecia Pour, MD  topiramate (TOPAMAX) 25 MG tablet Take one tablet at bedtime for 1 week, then 2 tablets at bedtime for another week. 02/14/16   Melony Overly, MD   BP 123/80 mmHg  Pulse 78  Temp(Src) 97.5 F (36.4 C) (Oral)  Resp 18  Wt 126.1 kg  SpO2 97% Physical Exam  Constitutional: She is oriented to person, place, and time. No distress.  Obese, NAD  HENT:  Head: Atraumatic.  Right Ear: External ear normal.  Left Ear: External ear normal.  Nose: Nose normal.  Eyes: Conjunctivae are normal. No scleral icterus.  Neck: Normal range of motion. Neck supple.  No meningismus  Cardiovascular: Normal rate and regular rhythm.   Pulmonary/Chest: Effort normal. No respiratory distress. She exhibits no tenderness.  Abdominal: Soft. She exhibits no distension. There is no tenderness.  Musculoskeletal: Normal range of motion.  Neurological: She is alert and oriented to person, place, and time. She has normal reflexes. No cranial nerve deficit.  Cranial nerves intact 5/5 strength x 4 Normal finger to nose No pronator drift Normal RAM Steady gait  Skin: Skin is warm and dry. She is not diaphoretic.  Psychiatric: She has a normal mood and affect. Her behavior is normal.  Nursing note and vitals reviewed.   ED Course  Procedures (including critical care time) Labs Review Labs Reviewed -  No data to display  Imaging Review Ct Head Wo Contrast  02/16/2016  CLINICAL DATA:  Patient with severe frontal headache for multiple weeks. EXAM: CT HEAD WITHOUT CONTRAST TECHNIQUE: Contiguous axial images were obtained from the base of the skull through the vertex without intravenous contrast. COMPARISON:  None. FINDINGS: Ventricles and sulci are appropriate for patient's age. No evidence for acute cortically based infarct, intracranial hemorrhage, mass lesion or mass-effect. Bilateral basal ganglia calcifications. Orbits are unremarkable. Paranasal sinuses are well aerated. Mastoid air cells unremarkable. Calvarium is intact. IMPRESSION: No acute intracranial process. Electronically Signed   By: Lovey Newcomer M.D.   On: 02/16/2016 15:33   I have personally reviewed and evaluated these images and  lab results as part of my medical decision-making.   EKG Interpretation None      MDM   Final diagnoses:  Acute nonintractable headache, unspecified headache type    CT was negative for acute findings. Low suspicion for thrombotic or other emergent etiology requiring emergent MR/MRA. Neuro exam intact. Headache improved with meds in the ED. Pt has a long history of headaches now worsening. I suspect she would benefit from a neurology evaluation as an outpatient in addition to her sleep study. Ddx does include pseudotumor cerebri, rebound, or OSA-related headache. She is otherwise nontoxic appearing and stable for discharge. ER return precautions given.    Anne Ng, PA-C 02/16/16 1726  Ezequiel Essex, MD 02/16/16 [id number]

## 2016-02-16 NOTE — ED Notes (Signed)
The pt just returned from c-t still has a headache but its better

## 2016-02-23 ENCOUNTER — Encounter (HOSPITAL_COMMUNITY): Payer: Self-pay

## 2016-02-23 ENCOUNTER — Emergency Department (HOSPITAL_COMMUNITY)
Admission: EM | Admit: 2016-02-23 | Discharge: 2016-02-23 | Disposition: A | Discharge status: Home / Self Care | Payer: Medicaid Other | Attending: Emergency Medicine | Admitting: Emergency Medicine

## 2016-02-23 DIAGNOSIS — E119 Type 2 diabetes mellitus without complications: Secondary | ICD-10-CM | POA: Diagnosis not present

## 2016-02-23 DIAGNOSIS — Z8739 Personal history of other diseases of the musculoskeletal system and connective tissue: Secondary | ICD-10-CM | POA: Diagnosis not present

## 2016-02-23 DIAGNOSIS — Z862 Personal history of diseases of the blood and blood-forming organs and certain disorders involving the immune mechanism: Secondary | ICD-10-CM | POA: Diagnosis not present

## 2016-02-23 DIAGNOSIS — Z79899 Other long term (current) drug therapy: Secondary | ICD-10-CM | POA: Diagnosis not present

## 2016-02-23 DIAGNOSIS — Z856 Personal history of leukemia: Secondary | ICD-10-CM | POA: Insufficient documentation

## 2016-02-23 DIAGNOSIS — Z7984 Long term (current) use of oral hypoglycemic drugs: Secondary | ICD-10-CM | POA: Diagnosis not present

## 2016-02-23 DIAGNOSIS — Z8719 Personal history of other diseases of the digestive system: Secondary | ICD-10-CM | POA: Diagnosis not present

## 2016-02-23 DIAGNOSIS — R110 Nausea: Secondary | ICD-10-CM | POA: Diagnosis not present

## 2016-02-23 DIAGNOSIS — Z794 Long term (current) use of insulin: Secondary | ICD-10-CM | POA: Diagnosis not present

## 2016-02-23 DIAGNOSIS — R51 Headache: Secondary | ICD-10-CM | POA: Insufficient documentation

## 2016-02-23 DIAGNOSIS — G8929 Other chronic pain: Secondary | ICD-10-CM | POA: Insufficient documentation

## 2016-02-23 DIAGNOSIS — I10 Essential (primary) hypertension: Secondary | ICD-10-CM | POA: Insufficient documentation

## 2016-02-23 MED ORDER — PROCHLORPERAZINE EDISYLATE 5 MG/ML IJ SOLN
10.0000 mg | Freq: Once | INTRAMUSCULAR | Status: AC
  Administered 2016-02-23: 10 mg via INTRAVENOUS
  Filled 2016-02-23: qty 2

## 2016-02-23 MED ORDER — DIPHENHYDRAMINE HCL 50 MG/ML IJ SOLN
25.0000 mg | Freq: Once | INTRAMUSCULAR | Status: AC
  Administered 2016-02-23: 25 mg via INTRAVENOUS
  Filled 2016-02-23: qty 1

## 2016-02-23 MED ORDER — DIPHENHYDRAMINE HCL 25 MG PO CAPS
25.0000 mg | ORAL_CAPSULE | Freq: Once | ORAL | Status: AC
  Administered 2016-02-23: 25 mg via ORAL
  Filled 2016-02-23: qty 1

## 2016-02-23 MED ORDER — KETOROLAC TROMETHAMINE 30 MG/ML IJ SOLN
30.0000 mg | Freq: Once | INTRAMUSCULAR | Status: AC
  Administered 2016-02-23: 30 mg via INTRAVENOUS
  Filled 2016-02-23: qty 1

## 2016-02-23 NOTE — Discharge Instructions (Signed)

## 2016-02-23 NOTE — ED Provider Notes (Signed)
CSN: [id number]     Arrival date & time 02/23/16  1400 History   First MD Initiated Contact with Patient 02/23/16 1526     Chief Complaint  Patient presents with  . Headache    (Consider location/radiation/quality/duration/timing/severity/associated sxs/prior Treatment) HPI This is a 30 year old female with a history of AML (in remission since 2014), hypertension, OSA, chronic headaches, and diabetes resenting to the ED due to headaches.  She's had a HA that is bilateral, pressure-like for the last 3 weeks. Her headaches are present In the mornings when she wakes up, but are persistent throughout the day.  No photophobia or phonophobia. Not currently have any visual abnormalities such as scotoma or blurred vision.  No weakness, paresthesias, dysarthria.  She has nausea in AM but no vomiting. Her headache responds transiently to Ibuprofen for approximately 30 minutes however then it responds. It is diffifcult to concentrate at work which is why she came to the ED. She's been to the ED several times in the past and very to headaches. She states that sometimes the migraine cocktails help, sometimes do not.  Initially she stated that she had a sleep apnea study in 2 weeks, however then states that she has a follow-up with her PCP to discuss sleep apnea.   Past Medical History  Diagnosis Date  . Hypertension   . Nephrolithiasis   . Hepatic steatosis 01/17/2012  . Cholelithiasis 01/17/2012  . Headache(784.0)   . Bacteremia associated with IV line ([place]) 05/01/2013    Port-a-cath infection, removed 8/6 by IR. Ecoli grew out in cultures. Sent home with Ceftriaxone to complete 14 day course.   . Bacteremia due to Escherichia coli 05/01/2013    Port-a-cath infection, removed 8/6 by IR. Ecoli grew out in cultures. Sent home with Ceftriaxone to complete 14 day course.   . 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 Leukemia, acute monocytic, in remission ([place])   . Diabetes mellitus     type  2, insulin  . Collagen vascular disease [facility])    Past Surgical History  Procedure Laterality Date  . Portacath placement      [facility]  . Cholecystectomy  01/31/2012    Procedure: LAPAROSCOPIC CHOLECYSTECTOMY WITH INTRAOPERATIVE CHOLANGIOGRAM;  Surgeon: Joyice Faster. Cornett, MD;  Location: [place] OR;  Service: General;  Laterality: N/A;   Family History  Problem Relation Age of Onset  . Kidney disease Mother   . Hypertension Mother   . Epilepsy Mother   . Sleep apnea Mother   . Kidney disease Maternal Grandmother   . Diabetes Maternal Grandmother   . Diabetes Father    Social History  Substance Use Topics  . Smoking status: Never Smoker   . Smokeless tobacco: Never Used  . Alcohol Use: No   OB History    Gravida Para Term Preterm AB TAB SAB Ectopic Multiple Living   '2 2 2 ' 0 0 0 0 0 0 2     Review of Systems  Constitutional: Negative for fever, diaphoresis, activity change and appetite change.  HENT: Negative for facial swelling, sinus pressure and sore throat.   Eyes: Negative for photophobia and pain.  Respiratory: Negative for apnea, cough and shortness of breath.   Cardiovascular: Negative for chest pain and palpitations.  Gastrointestinal: Positive for nausea. Negative for vomiting and abdominal pain.  Endocrine: Negative for polydipsia.  Genitourinary: Negative for decreased urine volume and difficulty urinating.  Musculoskeletal: Negative for back pain, neck pain and neck stiffness.  Skin:  Negative for rash.  Neurological: Positive for headaches. Negative for dizziness, tremors, speech difficulty, weakness, light-headedness and numbness.  Hematological: Negative for adenopathy.  Psychiatric/Behavioral: Negative for hallucinations.    Allergies  Ultram  Home Medications   Prior to Admission medications   Medication Sig Start Date End Date Taking? Authorizing Provider  ACCU-CHEK FASTCLIX LANCETS MISC 1 each by Does not apply route 3 (three) times daily. Check sugar 3x  daily Patient not taking: Reported on 02/16/2016 06/07/14   Renee A Kuneff, DO  Blood Glucose Monitoring Suppl (ACCU-CHEK NANO SMARTVIEW) W/DEVICE KIT Check blood glucose x3 daily. Must take morning fasting blood glucose Patient not taking: Reported on 02/16/2016 06/07/14   Renee A Kuneff, DO  diazepam (VALIUM) 5 MG tablet Take 1 tablet (5 mg total) by mouth every 6 (six) hours as needed for muscle spasms. Patient not taking: Reported on 02/16/2016 11/18/15   Orpah Greek, MD  glucose blood (ACCU-CHEK SMARTVIEW) test strip Check sugar 3 x daily 06/07/14   Renee A Kuneff, DO  insulin detemir (LEVEMIR) 100 UNIT/ML injection Inject 0.2 mLs (20 Units total) into the skin at bedtime. Patient not taking: Reported on 02/16/2016 03/29/15   Patrecia Pour, MD  Insulin Glargine (LANTUS) 100 UNIT/ML Solostar Pen Inject 20 Units into the skin daily at 10 pm. 12/29/15   Monico Blitz, PA-C  Insulin Pen Needle 29G X 8MM MISC As directed Patient not taking: Reported on 02/16/2016 12/29/15   Elmyra Ricks Pisciotta, PA-C  Insulin Syringe-Needle U-100 (INSULIN SYRINGE 1CC/31GX5/16") 31G X 5/16" 1 ML MISC Use as directed to inject insulin. 01/20/15   Lutricia Feil, PA  levonorgestrel (MIRENA) 20 MCG/24HR IUD 1 each by Intrauterine route continuous.    Historical Provider, MD  lisinopril (PRINIVIL,ZESTRIL) 5 MG tablet Take 1 tablet (5 mg total) by mouth daily. 06/19/14   Renee A Kuneff, DO  metFORMIN (GLUCOPHAGE) 1000 MG tablet Take 1 tablet (1,000 mg total) by mouth 2 (two) times daily with a meal. 03/29/15   Patrecia Pour, MD  topiramate (TOPAMAX) 25 MG tablet Take one tablet at bedtime for 1 week, then 2 tablets at bedtime for another week. Patient taking differently: Take 25 mg by mouth daily.  02/14/16   Melony Overly, MD   BP 124/76 mmHg  Pulse 88  Temp(Src) 98.1 F (36.7 C)  Resp 18  Ht '5\' 2"'  (1.575 m)  Wt 122.653 kg  BMI 49.44 kg/m2  SpO2 98% Physical Exam  Constitutional: She is oriented to person, place,  and time. She appears well-developed. No distress.  HENT:  Head: Normocephalic.  Mouth/Throat: Oropharynx is clear and moist. No oropharyngeal exudate.  Eyes: Conjunctivae and EOM are normal. Pupils are equal, round, and reactive to light. Right eye exhibits no discharge. Left eye exhibits no discharge. No scleral icterus.  Neck: Normal range of motion. No JVD present.  Cardiovascular: Normal rate and normal heart sounds.  Exam reveals no friction rub.   No murmur heard. Pulmonary/Chest: Effort normal. No respiratory distress. She has no wheezes. She has no rales. She exhibits no tenderness.  Abdominal: Soft. She exhibits no distension. There is no tenderness. There is no rebound and no guarding.  Musculoskeletal: She exhibits no edema or tenderness.  Neurological: She is alert and oriented to person, place, and time. She has normal strength. She is not disoriented. She displays no atrophy and no tremor. No cranial nerve deficit or sensory deficit. She exhibits normal muscle tone. She displays a negative Romberg sign.  GCS eye subscore is 4. GCS verbal subscore is 5. GCS motor subscore is 6. She displays no Babinski's sign on the right side. She displays no Babinski's sign on the left side.  Skin: She is not diaphoretic.    ED Course  Procedures (including critical care time) Labs Review Labs Reviewed - No data to display  Imaging Review No results found. I have personally reviewed and evaluated these images and lab results as part of my medical decision-making.   EKG Interpretation None       Ocular ultrasound at the bedside revealed an optic nerve sheath measuring 3.5 mm at its greatest diameter.  MDM   Final diagnoses:  Chronic nonintractable headache, unspecified headache type    Ms. Schwarz is a 30 year old female with a past mental history of obesity, chronic headaches, hypertension, and diabetes presenting for chronic headaches. She had a CT of her head less than one week  ago which was negative for any acute pathology. Her headache has not differentiated or change since that time. Her neurologic exam is intact. Given that her headaches are present mostly in the morning, high in high suspicious for sleep apnea. Concerns for increased intracranial pressure, a bedside ocular ultrasound was performed and was negative. The patient is frequently taking ibuprofen, even when she doesn't have a headache, she is high risk for rebound headaches as well.  The patient is nontoxic on exam and stable for discharge. Discussed return precautions. Discussed the importance of following up with her PCP in obtaining that sleep apnea study. Patient in agreement.    Archie Patten, MD 02/23/16 [place], DO 02/23/16 [phone]

## 2016-02-23 NOTE — ED Provider Notes (Signed)
I have personally seen and examined the patient.  I have discussed the plan of care with the resident.  I have reviewed the documentation on PMH/FH/Soc. History.  I have reviewed the documentation of the resident and agree.   EKG Interpretation  Date/Time:    Ventricular Rate:    PR Interval:    QRS Duration:   QT Interval:    QTC Calculation:   R Axis:     Text Interpretation:        EMERGENCY DEPARTMENT [place] OCULAR EXAM "Study: Limited Ultrasound of Orbit "  INDICATIONS: headache, eval for intercranial pressure Linear probe utilized to obtain images in both long and short axis of the orbit having the patient look left and right if possible.  PERFORMED BY: Myself  IMAGES ARCHIVED?: Yes  LIMITATIONS: none  VIEWS USED: Right orbit and Left orbit  INTERPRETATION: No retinal detachment, Lens in proper position, Normal Optic nerve diameter    30 yo F With a headache. This been going on for about 3 weeks. Patient has been taking ibuprofen even when she doesn't have a headache. Neurologically intact. Bedside ultrasound with no increased intercranial pressure. Patient's headache was improved with headache cocktail. Discharge home.    Deno Etienne, DO 02/23/16 1658

## 2016-02-23 NOTE — ED Notes (Signed)
Patient here with frontal headache x 3 weeks, complains of intermittent nausea with same. Denies trauma. Has ben seen for same per patient. Alert and oriented

## 2016-05-05 ENCOUNTER — Ambulatory Visit (INDEPENDENT_AMBULATORY_CARE_PROVIDER_SITE_OTHER): Payer: Medicaid Other

## 2016-05-05 ENCOUNTER — Encounter (HOSPITAL_COMMUNITY): Payer: Self-pay | Admitting: Family Medicine

## 2016-05-05 ENCOUNTER — Ambulatory Visit (HOSPITAL_COMMUNITY)
Admission: EM | Admit: 2016-05-05 | Discharge: 2016-05-05 | Disposition: A | Discharge status: Home / Self Care | Payer: Medicaid Other | Attending: Family Medicine | Admitting: Family Medicine

## 2016-05-05 DIAGNOSIS — S80811D Abrasion, right lower leg, subsequent encounter: Secondary | ICD-10-CM | POA: Diagnosis not present

## 2016-05-05 DIAGNOSIS — S93401A Sprain of unspecified ligament of right ankle, initial encounter: Secondary | ICD-10-CM | POA: Diagnosis not present

## 2016-05-05 DIAGNOSIS — W1839XA Other fall on same level, initial encounter: Secondary | ICD-10-CM | POA: Diagnosis not present

## 2016-05-05 DIAGNOSIS — S80811A Abrasion, right lower leg, initial encounter: Secondary | ICD-10-CM | POA: Diagnosis not present

## 2016-05-05 DIAGNOSIS — W010XXA Fall on same level from slipping, tripping and stumbling without subsequent striking against object, initial encounter: Secondary | ICD-10-CM

## 2016-05-05 MED ORDER — NAPROXEN 375 MG PO TABS: 375.0000 mg | ORAL_TABLET | Freq: Two times a day (BID) | ORAL | 0 refills | Status: DC

## 2016-05-05 MED ORDER — TETANUS-DIPHTH-ACELL PERTUSSIS 5-2.5-18.5 LF-MCG/0.5 IM SUSP
0.5000 mL | Freq: Once | INTRAMUSCULAR | Status: AC
  Administered 2016-05-05: 0.5 mL via INTRAMUSCULAR

## 2016-05-05 MED ORDER — HYDROCODONE-ACETAMINOPHEN 5-325 MG PO TABS: 1.0000 | ORAL_TABLET | ORAL | 0 refills | Status: DC | PRN

## 2016-05-05 MED ORDER — TETANUS-DIPHTH-ACELL PERTUSSIS 5-2.5-18.5 LF-MCG/0.5 IM SUSP
INTRAMUSCULAR | Status: AC
  Filled 2016-05-05: qty 0.5

## 2016-05-05 NOTE — ED Triage Notes (Signed)
Pt here for right leg pain and ankle swelling after a fall today.

## 2016-05-05 NOTE — ED Provider Notes (Signed)
CSN: [id number]     Arrival date & time 05/05/16  1942 History   First MD Initiated Contact with Patient 05/05/16 2149     Chief Complaint  Patient presents with  . Fall  . Leg Pain  . Ankle Pain   (Consider location/radiation/quality/duration/timing/severity/associated sxs/prior Treatment) 30 year old obese female states she stumbled down some cement stairs at home today. In the process she twisted her right ankle. She is complaining of pain with swelling to the right ankle, tenderness to the lateral aspect of the proximal calf where there are several small abrasions, one abrasion to the ankle and one abrasion to the foot. Denies injury to the head, neck, back, chest, upper extremities, abdomen or left lower extremity. She is ambulatory with partial weightbearing.      Past Medical History:  Diagnosis Date  . 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 Bacteremia associated with IV line ([place]) 05/01/2013   Port-a-cath infection, removed 8/6 by IR. Ecoli grew out in cultures. Sent home with Ceftriaxone to complete 14 day course.   . Bacteremia due to Escherichia coli 05/01/2013   Port-a-cath infection, removed 8/6 by IR. Ecoli grew out in cultures. Sent home with Ceftriaxone to complete 14 day course.   . Cholelithiasis 01/17/2012  . Collagen vascular disease ([place])   . Diabetes mellitus    type 2, insulin  . Headache(784.0)   . Hepatic steatosis 01/17/2012  . Hypertension   . Leukemia, acute monocytic, in remission ([place])   . Nephrolithiasis    Past Surgical History:  Procedure Laterality Date  . CHOLECYSTECTOMY  01/31/2012   Procedure: LAPAROSCOPIC CHOLECYSTECTOMY WITH INTRAOPERATIVE CHOLANGIOGRAM;  Surgeon: Marcello Moores A. Cornett, MD;  Location: [place] OR;  Service: General;  Laterality: N/A;  . PORTACATH PLACEMENT     [place]   Family History  Problem Relation Age of Onset  . Kidney disease Mother   . Hypertension Mother   . Epilepsy Mother   . Sleep apnea Mother   .  Diabetes Father   . Kidney disease Maternal Grandmother   . Diabetes Maternal Grandmother    Social History  Substance Use Topics  . Smoking status: Never Smoker  . Smokeless tobacco: Never Used  . Alcohol use No   OB History    Gravida Para Term Preterm AB Living   '2 2 2 ' 0 0 2   SAB TAB Ectopic Multiple Live Births   0 0 0 0 2     Review of Systems  Constitutional: Negative for activity change, chills and fever.  HENT: Negative.   Eyes: Negative.   Respiratory: Negative.   Cardiovascular: Negative.   Musculoskeletal: Positive for arthralgias and joint swelling.       As per HPI  Skin: Positive for wound. Negative for color change, pallor and rash.  Neurological: Negative.     Allergies  Ultram [tramadol hcl]  Home Medications   Prior to Admission medications   Medication Sig Start Date End Date Taking? Authorizing Provider  ACCU-CHEK FASTCLIX LANCETS MISC 1 each by Does not apply route 3 (three) times daily. Check sugar 3x daily Patient not taking: Reported on 02/16/2016 06/07/14   Renee A Kuneff, DO  Blood Glucose Monitoring Suppl (ACCU-CHEK NANO SMARTVIEW) W/DEVICE KIT Check blood glucose x3 daily. Must take morning fasting blood glucose Patient not taking: Reported on 02/16/2016 06/07/14   Renee A Kuneff, DO  diazepam (VALIUM) 5 MG tablet Take 1 tablet (5 mg total) by mouth every 6 (six) hours as  needed for muscle spasms. Patient not taking: Reported on 02/16/2016 11/18/15   Orpah Greek, MD  glucose blood (ACCU-CHEK SMARTVIEW) test strip Check sugar 3 x daily 06/07/14   Renee A Kuneff, DO  HYDROcodone-acetaminophen (NORCO/VICODIN) 5-325 MG tablet Take 1 tablet by mouth every 4 (four) hours as needed. 05/05/16   Janne Napoleon, NP  insulin detemir (LEVEMIR) 100 UNIT/ML injection Inject 0.2 mLs (20 Units total) into the skin at bedtime. Patient not taking: Reported on 02/16/2016 03/29/15   Patrecia Pour, MD  Insulin Glargine (LANTUS) 100 UNIT/ML Solostar Pen Inject 20 Units  into the skin daily at 10 pm. 12/29/15   Monico Blitz, PA-C  Insulin Pen Needle 29G X 8MM MISC As directed Patient not taking: Reported on 02/16/2016 12/29/15   Elmyra Ricks Pisciotta, PA-C  Insulin Syringe-Needle U-100 (INSULIN SYRINGE 1CC/31GX5/16") 31G X 5/16" 1 ML MISC Use as directed to inject insulin. 01/20/15   Lutricia Feil, PA  levonorgestrel (MIRENA) 20 MCG/24HR IUD 1 each by Intrauterine route continuous.    Historical Provider, MD  lisinopril (PRINIVIL,ZESTRIL) 5 MG tablet Take 1 tablet (5 mg total) by mouth daily. 06/19/14   Renee A Kuneff, DO  metFORMIN (GLUCOPHAGE) 1000 MG tablet Take 1 tablet (1,000 mg total) by mouth 2 (two) times daily with a meal. 03/29/15   Patrecia Pour, MD  naproxen (NAPROSYN) 375 MG tablet Take 1 tablet (375 mg total) by mouth 2 (two) times daily. 05/05/16   Janne Napoleon, NP  topiramate (TOPAMAX) 25 MG tablet Take one tablet at bedtime for 1 week, then 2 tablets at bedtime for another week. Patient taking differently: Take 25 mg by mouth daily.  02/14/16   Melony Overly, MD   Meds Ordered and Administered this Visit   Medications  Tdap (BOOSTRIX) injection 0.5 mL (not administered)    BP 117/72 (BP Location: Left Arm)   Pulse 97   Temp 98.3 F (36.8 C) (Oral)   Resp 16   SpO2 97%  No data found.   Physical Exam  Constitutional: She is oriented to person, place, and time. She appears well-developed and well-nourished. No distress.  HENT:  Head: Normocephalic and atraumatic.  Eyes: EOM are normal. Pupils are equal, round, and reactive to light.  Neck: Normal range of motion. Neck supple.  Cardiovascular: Normal rate.   Pulmonary/Chest: Effort normal.  Musculoskeletal: Normal range of motion.  Right ankle with minor swelling to the lateral aspect. No deformity. Full range of motion of the ankle. No pain tenderness swelling to the foot. Normal warmth and color. One abrasion to the dorsum of the right foot and one abrasion to the lateral ankle. Pedal pulse  2+. Abrasion to the proximal calf lateral aspect. No bony tenderness to the tibia. No pain or tenderness or injury to the knee.  Lymphadenopathy:    She has no cervical adenopathy.  Neurological: She is alert and oriented to person, place, and time. No cranial nerve deficit. Coordination normal.  Skin: Skin is warm and dry.  Psychiatric: She has a normal mood and affect.  Nursing note and vitals reviewed.   Urgent Care Course   Clinical Course    Procedures (including critical care time)  Labs Review Labs Reviewed - No data to display  Imaging Review Dg Ankle Complete Right  Result Date: 05/05/2016 CLINICAL DATA:  Fall. EXAM: RIGHT ANKLE - COMPLETE 3+ VIEW COMPARISON:  None. FINDINGS: There is no evidence of fracture, dislocation, or joint effusion. There is no evidence  of arthropathy or other focal bone abnormality. Soft tissues are unremarkable. IMPRESSION: Negative. Electronically Signed   By: Franchot Gallo M.D.   On: 05/05/2016 21:38     Visual Acuity Review  Right Eye Distance:   Left Eye Distance:   Bilateral Distance:    Right Eye Near:   Left Eye Near:    Bilateral Near:         MDM   1. Fall from slip, trip, or stumble, initial encounter   2. Abrasion of right lower leg, initial encounter   3. Ankle sprain, right, initial encounter     Clean the wound each day or as told by your health care provider.  Wash the wound with mild soap and water.  Rinse the wound with water to remove all soap.  Pat the wound dry with a clean towel. Do not rub it. Meds ordered this encounter  Medications  . Tdap (BOOSTRIX) injection 0.5 mL  . naproxen (NAPROSYN) 375 MG tablet    Sig: Take 1 tablet (375 mg total) by mouth 2 (two) times daily.    Dispense:  20 tablet    Refill:  0    Order Specific Question:   Supervising Provider    Answer:   Billy Fischer [phone]  . HYDROcodone-acetaminophen (NORCO/VICODIN) 5-325 MG tablet    Sig: Take 1 tablet by mouth every 4  (four) hours as needed.    Dispense:  15 tablet    Refill:  0    Order Specific Question:   Supervising Provider    Answer:   Billy Fischer [phone]   Watch for signs of infection. Recheck promptly for worsening new symptoms or problems. For ankle RICE,  ASO brace    Janne Napoleon, NP 05/05/16 2208    Janne Napoleon, NP 05/05/16 2210

## 2016-05-11 ENCOUNTER — Encounter (HOSPITAL_COMMUNITY): Payer: Self-pay | Admitting: Family Medicine

## 2016-05-11 ENCOUNTER — Ambulatory Visit (HOSPITAL_COMMUNITY)
Admission: EM | Admit: 2016-05-11 | Discharge: 2016-05-11 | Disposition: A | Discharge status: Home / Self Care | Payer: Medicaid Other | Attending: Family Medicine | Admitting: Family Medicine

## 2016-05-11 DIAGNOSIS — S80811D Abrasion, right lower leg, subsequent encounter: Secondary | ICD-10-CM

## 2016-05-11 MED ORDER — MUPIROCIN 2 % EX OINT: TOPICAL_OINTMENT | CUTANEOUS | 0 refills | Status: DC

## 2016-05-11 NOTE — Discharge Instructions (Signed)
Wash regularly then apply ointment.return if problems continue.

## 2016-05-11 NOTE — ED Triage Notes (Signed)
Pt here for wound to right ankle that is not healing. sts she is worried because it is a diabetic.

## 2016-05-11 NOTE — ED Provider Notes (Signed)
[facility]    CSN: [id number] Arrival date & time: 05/11/16  1538  First Provider Contact:  First MD Initiated Contact with Patient 05/11/16 1633        History   Chief Complaint Chief Complaint  Patient presents with  . Abrasion    HPI Melissa Washington is a 30 y.o. female.    Wound Check  This is a new problem. The current episode started more than 2 days ago. The problem has been gradually improving. Nothing aggravates the symptoms. Nothing relieves the symptoms.    Past Medical History:  Diagnosis Date  . 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 Bacteremia associated with IV line ([place]) 05/01/2013   Port-a-cath infection, removed 8/6 by IR. Ecoli grew out in cultures. Sent home with Ceftriaxone to complete 14 day course.   . Bacteremia due to Escherichia coli 05/01/2013   Port-a-cath infection, removed 8/6 by IR. Ecoli grew out in cultures. Sent home with Ceftriaxone to complete 14 day course.   . Cholelithiasis 01/17/2012  . Collagen vascular disease ([place])   . Diabetes mellitus    type 2, insulin  . Headache(784.0)   . Hepatic steatosis 01/17/2012  . Hypertension   . Leukemia, acute monocytic, in remission ([place])   . Nephrolithiasis     Patient Active Problem List   Diagnosis Date Noted  . Vaginitis and vulvovaginitis 10/09/2014  . Alopecia 06/26/2014  . Bilateral hip pain 06/19/2014  . Anxiety associated with depression 06/19/2014  . Allergic rhinitis 06/07/2014  . IUD complication ([place]) 36/46/8032  . Exposure to STD 06/07/2014  . Acute promyelocytic leukemia in remission ([place]) 11/19/2012  . Morbid obesity with BMI of 50.0-59.9, adult ([place]) 01/20/2012  . DM (diabetes mellitus), type 2, uncontrolled ([place]) 12/30/2006  . HYPERTENSION, BENIGN ESSENTIAL 12/30/2006    Past Surgical History:  Procedure Laterality Date  . CHOLECYSTECTOMY  01/31/2012   Procedure: LAPAROSCOPIC CHOLECYSTECTOMY WITH INTRAOPERATIVE CHOLANGIOGRAM;  Surgeon:  Marcello Moores A. Cornett, MD;  Location: [facility] OR;  Service: General;  Laterality: N/A;  . PORTACATH PLACEMENT     [place]    OB History    Gravida Para Term Preterm AB Living   _0 0 0 2   SAB TAB Ectopic Multiple Live Births   0 0 0 0 2       Home Medications    Prior to Admission medications   Medication Sig Start Date End Date Taking? Authorizing Provider  ACCU-CHEK FASTCLIX LANCETS MISC 1 each by Does not apply route 3 (three) times daily. Check sugar 3x daily Patient not taking: Reported on 02/16/2016 06/07/14   Renee A Kuneff, DO  Blood Glucose Monitoring Suppl (ACCU-CHEK NANO SMARTVIEW) W/DEVICE KIT Check blood glucose x3 daily. Must take morning fasting blood glucose Patient not taking: Reported on 02/16/2016 06/07/14   Renee A Kuneff, DO  diazepam (VALIUM) 5 MG tablet Take 1 tablet (5 mg total) by mouth every 6 (six) hours as needed for muscle spasms. Patient not taking: Reported on 02/16/2016 11/18/15   Orpah Greek, MD  glucose blood (ACCU-CHEK SMARTVIEW) test strip Check sugar 3 x daily 06/07/14   Renee A Kuneff, DO  HYDROcodone-acetaminophen (NORCO/VICODIN) 5-325 MG tablet Take 1 tablet by mouth every 4 (four) hours as needed. 05/05/16   Janne Napoleon, NP  insulin detemir (LEVEMIR) 100 UNIT/ML injection Inject 0.2 mLs (20 Units total) into the skin at bedtime. Patient not taking: Reported on 02/16/2016 03/29/15   Patrecia Pour,  MD  Insulin Glargine (LANTUS) 100 UNIT/ML Solostar Pen Inject 20 Units into the skin daily at 10 pm. 12/29/15   Elmyra Ricks Pisciotta, PA-C  Insulin Pen Needle 29G X 8MM MISC As directed Patient not taking: Reported on 02/16/2016 12/29/15   Elmyra Ricks Pisciotta, PA-C  Insulin Syringe-Needle U-100 (INSULIN SYRINGE 1CC/31GX5/16") 31G X 5/16" 1 ML MISC Use as directed to inject insulin. 01/20/15   Lutricia Feil, PA  levonorgestrel (MIRENA) 20 MCG/24HR IUD 1 each by Intrauterine route continuous.    Historical Provider, MD  lisinopril (PRINIVIL,ZESTRIL) 5 MG tablet Take 1  tablet (5 mg total) by mouth daily. 06/19/14   Renee A Kuneff, DO  metFORMIN (GLUCOPHAGE) 1000 MG tablet Take 1 tablet (1,000 mg total) by mouth 2 (two) times daily with a meal. 03/29/15   Patrecia Pour, MD  mupirocin ointment Drue Stager) 2 % Apply to skin bid 05/11/16   Billy Fischer, MD  naproxen (NAPROSYN) 375 MG tablet Take 1 tablet (375 mg total) by mouth 2 (two) times daily. 05/05/16   Janne Napoleon, NP  topiramate (TOPAMAX) 25 MG tablet Take one tablet at bedtime for 1 week, then 2 tablets at bedtime for another week. Patient taking differently: Take 25 mg by mouth daily.  02/14/16   Melony Overly, MD    Family History Family History  Problem Relation Age of Onset  . Kidney disease Mother   . Hypertension Mother   . Epilepsy Mother   . Sleep apnea Mother   . Diabetes Father   . Kidney disease Maternal Grandmother   . Diabetes Maternal Grandmother     Social History Social History  Substance Use Topics  . Smoking status: Never Smoker  . Smokeless tobacco: Never Used  . Alcohol use No     Allergies   Ultram [tramadol hcl]   Review of Systems Review of Systems  Constitutional: Negative.   Skin: Positive for wound.  All other systems reviewed and are negative.    Physical Exam Triage Vital Signs ED Triage Vitals  Enc Vitals Group     BP 05/11/16 1607 (!) 178/138     Pulse Rate 05/11/16 1607 84     Resp 05/11/16 1607 18     Temp 05/11/16 1607 98.5 F (36.9 C)     Temp src --      SpO2 05/11/16 1607 95 %     Weight --      Height --      Head Circumference --      Peak Flow --      Pain Score 05/11/16 1609 9     Pain Loc --      Pain Edu? --      Excl. in [place]? --    No data found.   Updated Vital Signs BP (!) 178/138   Pulse 84   Temp 98.5 F (36.9 C)   Resp 18   SpO2 95%   Visual Acuity Right Eye Distance:   Left Eye Distance:   Bilateral Distance:    Right Eye Near:   Left Eye Near:    Bilateral Near:     Physical Exam  Constitutional: She  appears well-developed and well-nourished. No distress.  Musculoskeletal: Normal range of motion.  Skin: Skin is warm and dry. Rash noted.  Sl abrasion present to right ankle, no cellulitis., sl tender.  Nursing note and vitals reviewed.    UC Treatments / Results  Labs (all labs ordered are listed, but only  abnormal results are displayed) Labs Reviewed - No data to display  EKG  EKG Interpretation None       Radiology No results found.  Procedures Procedures (including critical care time)  Medications Ordered in UC Medications - No data to display   Initial Impression / Assessment and Plan / UC Course  I have reviewed the triage vital signs and the nursing notes.  Pertinent labs & imaging results that were available during my care of the patient were reviewed by me and considered in my medical decision making (see chart for details).  Clinical Course      Final Clinical Impressions(s) / UC Diagnoses   Final diagnoses:  Abrasion of right lower leg, subsequent encounter    New Prescriptions New Prescriptions   MUPIROCIN OINTMENT (BACTROBAN) 2 %    Apply to skin bid     Billy Fischer, MD 05/11/16 [phone]

## 2016-06-15 ENCOUNTER — Emergency Department (HOSPITAL_COMMUNITY)
Admission: EM | Admit: 2016-06-15 | Discharge: 2016-06-16 | Disposition: A | Discharge status: Home / Self Care | Payer: Medicare Other | Attending: Emergency Medicine | Admitting: Emergency Medicine

## 2016-06-15 ENCOUNTER — Encounter (HOSPITAL_COMMUNITY): Payer: Self-pay | Admitting: Emergency Medicine

## 2016-06-15 DIAGNOSIS — Z794 Long term (current) use of insulin: Secondary | ICD-10-CM | POA: Insufficient documentation

## 2016-06-15 DIAGNOSIS — E119 Type 2 diabetes mellitus without complications: Secondary | ICD-10-CM | POA: Insufficient documentation

## 2016-06-15 DIAGNOSIS — Y92410 Unspecified street and highway as the place of occurrence of the external cause: Secondary | ICD-10-CM | POA: Insufficient documentation

## 2016-06-15 DIAGNOSIS — Y939 Activity, unspecified: Secondary | ICD-10-CM | POA: Diagnosis not present

## 2016-06-15 DIAGNOSIS — S4992XA Unspecified injury of left shoulder and upper arm, initial encounter: Secondary | ICD-10-CM | POA: Diagnosis not present

## 2016-06-15 DIAGNOSIS — I10 Essential (primary) hypertension: Secondary | ICD-10-CM | POA: Insufficient documentation

## 2016-06-15 DIAGNOSIS — Z79899 Other long term (current) drug therapy: Secondary | ICD-10-CM | POA: Diagnosis not present

## 2016-06-15 DIAGNOSIS — Y999 Unspecified external cause status: Secondary | ICD-10-CM | POA: Insufficient documentation

## 2016-06-15 DIAGNOSIS — Z7984 Long term (current) use of oral hypoglycemic drugs: Secondary | ICD-10-CM | POA: Insufficient documentation

## 2016-06-15 DIAGNOSIS — M791 Myalgia, unspecified site: Secondary | ICD-10-CM

## 2016-06-15 HISTORY — DX: Obesity, unspecified: E66.9
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
## 2016-06-16 DIAGNOSIS — S4992XA Unspecified injury of left shoulder and upper arm, initial encounter: Secondary | ICD-10-CM | POA: Diagnosis not present

## 2016-06-16 MED ORDER — OXYCODONE-ACETAMINOPHEN 5-325 MG PO TABS
1.0000 | ORAL_TABLET | Freq: Once | ORAL | Status: AC
  Administered 2016-06-16: 1 via ORAL
  Filled 2016-06-16: qty 1

## 2016-06-16 MED ORDER — NAPROXEN 500 MG PO TABS: 500.0000 mg | ORAL_TABLET | Freq: Two times a day (BID) | ORAL | 0 refills | Status: DC

## 2016-06-16 MED ORDER — CYCLOBENZAPRINE HCL 10 MG PO TABS: 10.0000 mg | ORAL_TABLET | Freq: Two times a day (BID) | ORAL | 0 refills | Status: DC | PRN
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
## 2016-06-16 NOTE — ED Notes (Signed)
Pt ambulatory to the room

## 2016-07-09 ENCOUNTER — Encounter (HOSPITAL_COMMUNITY): Payer: Self-pay

## 2016-07-09 ENCOUNTER — Emergency Department (HOSPITAL_COMMUNITY)
Admission: EM | Admit: 2016-07-09 | Discharge: 2016-07-09 | Disposition: A | Discharge status: Home / Self Care | Payer: Medicaid Other | Attending: Emergency Medicine | Admitting: Emergency Medicine

## 2016-07-09 DIAGNOSIS — Z7984 Long term (current) use of oral hypoglycemic drugs: Secondary | ICD-10-CM | POA: Insufficient documentation

## 2016-07-09 DIAGNOSIS — L03012 Cellulitis of left finger: Secondary | ICD-10-CM | POA: Insufficient documentation

## 2016-07-09 DIAGNOSIS — Z794 Long term (current) use of insulin: Secondary | ICD-10-CM | POA: Insufficient documentation

## 2016-07-09 DIAGNOSIS — M79645 Pain in left finger(s): Secondary | ICD-10-CM | POA: Diagnosis present

## 2016-07-09 DIAGNOSIS — I10 Essential (primary) hypertension: Secondary | ICD-10-CM | POA: Diagnosis not present

## 2016-07-09 DIAGNOSIS — E119 Type 2 diabetes mellitus without complications: Secondary | ICD-10-CM | POA: Diagnosis not present

## 2016-07-09 MED ORDER — SULFAMETHOXAZOLE-TRIMETHOPRIM 800-160 MG PO TABS: 1.0000 | ORAL_TABLET | Freq: Two times a day (BID) | ORAL | 0 refills | Status: AC

## 2016-07-09 NOTE — ED Triage Notes (Signed)
PT C/O PAIN AND SWELLING TO THE LEFT 3RD FINGER X2 DAYS, AFTER SHE PULLED OUT HER CUTICLE WHILE BITING HER NAILS. DENIES FEVER OR DRAINAGE.

## 2016-07-09 NOTE — ED Provider Notes (Signed)
[facility] DEPT Provider Note   CSN: [id number] Arrival date & time: 07/09/16  1826  By signing my name below, I, Melissa Washington, attest that this documentation has been prepared under the direction and in the presence of Chi Woodham, PA-C.  Electronically Signed: Julien Washington, ED Scribe. 07/09/16. 8:44 PM.    History   Chief Complaint Chief Complaint  Patient presents with  . Hand Pain    3RD   The history is provided by the patient. No language interpreter was used.   HPI Comments: Melissa Washington is a 30 y.o. female who presents to the Emergency Department complaining of sudden onset, gradual worsening, moderate left long finger pain x 2 days. Pt has associated swelling and tenderness to the area. She reports pulling her cuticle two days days ago and her symptoms started shortly after. Pt has taken ibuprofen and applied ice to alleviate her pain without relief. She denies fever or drainage.  Past Medical History:  Diagnosis Date  . 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 Bacteremia associated with IV line ([place]) 05/01/2013   Port-a-cath infection, removed 8/6 by IR. Ecoli grew out in cultures. Sent home with Ceftriaxone to complete 14 day course.   . Bacteremia due to Escherichia coli 05/01/2013   Port-a-cath infection, removed 8/6 by IR. Ecoli grew out in cultures. Sent home with Ceftriaxone to complete 14 day course.   . Cholelithiasis 01/17/2012  . Collagen vascular disease ([place])   . Diabetes mellitus    type 2, insulin  . Headache(784.0)   . Hepatic steatosis 01/17/2012  . Hypertension   . Leukemia, acute monocytic, in remission ([facility])   . Nephrolithiasis   . Obesity     Patient Active Problem List   Diagnosis Date Noted  . Vaginitis and vulvovaginitis 10/09/2014  . Alopecia 06/26/2014  . Bilateral hip pain 06/19/2014  . Anxiety associated with depression 06/19/2014  . Allergic rhinitis 06/07/2014  . IUD complication ([place]) 63/89/3734    . Exposure to STD 06/07/2014  . Acute promyelocytic leukemia in remission ([place]) 11/19/2012  . Morbid obesity with BMI of 50.0-59.9, adult ([place]) 01/20/2012  . DM (diabetes mellitus), type 2, uncontrolled ([place]) 12/30/2006  . HYPERTENSION, BENIGN ESSENTIAL 12/30/2006    Past Surgical History:  Procedure Laterality Date  . CHOLECYSTECTOMY  01/31/2012   Procedure: LAPAROSCOPIC CHOLECYSTECTOMY WITH INTRAOPERATIVE CHOLANGIOGRAM;  Surgeon: Marcello Moores A. Cornett, MD;  Location: [facility] OR;  Service: General;  Laterality: N/A;  . PORTACATH PLACEMENT     [place]    OB History    Gravida Para Term Preterm AB Living   '2 2 2 ' 0 0 2   SAB TAB Ectopic Multiple Live Births   0 0 0 0 2       Home Medications    Prior to Admission medications   Medication Sig Start Date End Date Taking? Authorizing Provider  ACCU-CHEK FASTCLIX LANCETS MISC 1 each by Does not apply route 3 (three) times daily. Check sugar 3x daily Patient not taking: Reported on 02/16/2016 06/07/14   Renee A Kuneff, DO  Blood Glucose Monitoring Suppl (ACCU-CHEK NANO SMARTVIEW) W/DEVICE KIT Check blood glucose x3 daily. Must take morning fasting blood glucose Patient not taking: Reported on 02/16/2016 06/07/14   Renee A Kuneff, DO  cyclobenzaprine (FLEXERIL) 10 MG tablet Take 1 tablet (10 mg total) by mouth 2 (two) times daily as needed for muscle spasms. 06/16/16   Etta Quill, NP  diazepam (VALIUM) 5 MG tablet Take 1 tablet (  5 mg total) by mouth every 6 (six) hours as needed for muscle spasms. Patient not taking: Reported on 02/16/2016 11/18/15   Orpah Greek, MD  glucose blood (ACCU-CHEK SMARTVIEW) test strip Check sugar 3 x daily 06/07/14   Renee A Kuneff, DO  HYDROcodone-acetaminophen (NORCO/VICODIN) 5-325 MG tablet Take 1 tablet by mouth every 4 (four) hours as needed. 05/05/16   Janne Napoleon, NP  insulin detemir (LEVEMIR) 100 UNIT/ML injection Inject 0.2 mLs (20 Units total) into the skin at bedtime. Patient not taking: Reported on  02/16/2016 03/29/15   Patrecia Pour, MD  Insulin Glargine (LANTUS) 100 UNIT/ML Solostar Pen Inject 20 Units into the skin daily at 10 pm. 12/29/15   Monico Blitz, PA-C  Insulin Pen Needle 29G X 8MM MISC As directed Patient not taking: Reported on 02/16/2016 12/29/15   Elmyra Ricks Pisciotta, PA-C  Insulin Syringe-Needle U-100 (INSULIN SYRINGE 1CC/31GX5/16") 31G X 5/16" 1 ML MISC Use as directed to inject insulin. 01/20/15   Lutricia Feil, PA  levonorgestrel (MIRENA) 20 MCG/24HR IUD 1 each by Intrauterine route continuous.    Historical Provider, MD  lisinopril (PRINIVIL,ZESTRIL) 5 MG tablet Take 1 tablet (5 mg total) by mouth daily. 06/19/14   Renee A Kuneff, DO  metFORMIN (GLUCOPHAGE) 1000 MG tablet Take 1 tablet (1,000 mg total) by mouth 2 (two) times daily with a meal. 03/29/15   Patrecia Pour, MD  mupirocin ointment Drue Stager) 2 % Apply to skin bid 05/11/16   Billy Fischer, MD  naproxen (NAPROSYN) 500 MG tablet Take 1 tablet (500 mg total) by mouth 2 (two) times daily. 06/16/16   Etta Quill, NP  topiramate (TOPAMAX) 25 MG tablet Take one tablet at bedtime for 1 week, then 2 tablets at bedtime for another week. Patient taking differently: Take 25 mg by mouth daily.  02/14/16   Melony Overly, MD    Family History Family History  Problem Relation Age of Onset  . Kidney disease Mother   . Hypertension Mother   . Epilepsy Mother   . Sleep apnea Mother   . Diabetes Father   . Kidney disease Maternal Grandmother   . Diabetes Maternal Grandmother     Social History Social History  Substance Use Topics  . Smoking status: Never Smoker  . Smokeless tobacco: Never Used  . Alcohol use No     Allergies   Ultram [tramadol hcl]   Review of Systems Review of Systems  Musculoskeletal: Positive for joint swelling.  Skin: Positive for wound.     Physical Exam Updated Vital Signs BP 133/94 (BP Location: Right Arm)   Pulse 76   Temp 98.3 F (36.8 C) (Oral)   Resp 20   Wt 265 lb (120.2 kg)    SpO2 100%   BMI 48.47 kg/m   Physical Exam  Constitutional: She is oriented to person, place, and time. She appears well-developed and well-nourished. No distress.  Eyes: Conjunctivae are normal.  Neck: Neck supple.  Musculoskeletal:  Paronychial to the left long finger, with mild swelling. No felon. Refill less than 2 seconds.  Neurological: She is alert and oriented to person, place, and time.  Skin: Skin is warm and dry.  Nursing note and vitals reviewed.    ED Treatments / Results  DIAGNOSTIC STUDIES: Oxygen Saturation is 100% on RA, normal by my interpretation.  COORDINATION OF CARE:  8:41 PM Discussed treatment plan with pt at bedside and pt agreed to plan.  Labs (all labs ordered are listed,  but only abnormal results are displayed) Labs Reviewed - No data to display  EKG  EKG Interpretation None       Radiology No results found.  Procedures Drain paronychia Date/Time: 07/09/2016 8:46 PM Performed by: Jeannett Senior Authorized by: Jeannett Senior  Consent: Verbal consent obtained. Consent given by: patient Patient understanding: patient states understanding of the procedure being performed Patient identity confirmed: verbally with patient Time out: Immediately prior to procedure a "time out" was called to verify the correct patient, procedure, equipment, support staff and site/side marked as required. Preparation: Patient was prepped and draped in the usual sterile fashion. Local anesthesia used: no  Anesthesia: Local anesthesia used: no  Sedation: Patient sedated: no Patient tolerance: Patient tolerated the procedure well with no immediate complications Comments: Number 11 blade was used to incised and draine paranychia. Patient received immediate relief.    (including critical care time)  Medications Ordered in ED Medications - No data to display   Initial Impression / Assessment and Plan / ED Course  I have reviewed the triage vital  signs and the nursing notes.  Pertinent labs & imaging results that were available during my care of the patient were reviewed by me and considered in my medical decision making (see chart for details).  Clinical Course   Patient emerged department with left long finger paronychia. Incised and drained. Finger then soaked in antibacterial soapy warm water. Dressing applied. Home with Bactrim, follow up as needed.   Vitals:   07/09/16 1840  BP: 133/94  Pulse: 76  Resp: 20  Temp: 98.3 F (36.8 C)  TempSrc: Oral  SpO2: 100%  Weight: 120.2 kg    I personally performed the services described in this documentation, which was scribed in my presence. The recorded information has been reviewed and is accurate.   Final Clinical Impressions(s) / ED Diagnoses   Final diagnoses:  Paronychia of finger of left hand    New Prescriptions New Prescriptions   SULFAMETHOXAZOLE-TRIMETHOPRIM (BACTRIM DS,SEPTRA DS) 800-160 MG TABLET    Take 1 tablet by mouth 2 (two) times daily.     Jeannett Senior, PA-C 07/09/16 2054    Fredia Sorrow, MD 07/10/16 2218

## 2016-07-09 NOTE — Discharge Instructions (Signed)
Soak your finger multiple times a day in warm soapy water. Take antibiotic as prescribed until all gone. Ibuprofen Tylenol. Keep your hand elevated to help the swelling and pain. Follow-up as needed.

## 2016-07-13 ENCOUNTER — Encounter (HOSPITAL_COMMUNITY): Payer: Self-pay | Admitting: Emergency Medicine

## 2016-07-13 ENCOUNTER — Ambulatory Visit (INDEPENDENT_AMBULATORY_CARE_PROVIDER_SITE_OTHER): Payer: Medicaid Other

## 2016-07-13 ENCOUNTER — Ambulatory Visit (HOSPITAL_COMMUNITY)
Admission: EM | Admit: 2016-07-13 | Discharge: 2016-07-13 | Discharge status: Against Medical Advice | Payer: Medicaid Other | Attending: Family Medicine | Admitting: Family Medicine

## 2016-07-13 DIAGNOSIS — R52 Pain, unspecified: Secondary | ICD-10-CM

## 2016-07-13 DIAGNOSIS — M7061 Trochanteric bursitis, right hip: Secondary | ICD-10-CM

## 2016-07-13 MED ORDER — MELOXICAM 7.5 MG PO TABS: 7.5000 mg | ORAL_TABLET | Freq: Two times a day (BID) | ORAL | 2 refills | Status: DC

## 2016-07-13 NOTE — ED Provider Notes (Signed)
[name]    CSN: [id number] Arrival date & time: 07/13/16  1608     History   Chief Complaint Chief Complaint  Patient presents with  . Hip Pain    HPI Melissa Washington is a 30 y.o. female.   The history is provided by the patient.  Hip Pain  This is a recurrent problem. The current episode started more than 2 days ago. The problem has not changed since onset.Pertinent negatives include no chest pain and no shortness of breath. The symptoms are aggravated by bending.    Past Medical History:  Diagnosis Date  . 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 Bacteremia associated with IV line ([place]) 05/01/2013   Port-a-cath infection, removed 8/6 by IR. Ecoli grew out in cultures. Sent home with Ceftriaxone to complete 14 day course.   . Bacteremia due to Escherichia coli 05/01/2013   Port-a-cath infection, removed 8/6 by IR. Ecoli grew out in cultures. Sent home with Ceftriaxone to complete 14 day course.   . Cholelithiasis 01/17/2012  . Collagen vascular disease ([place])   . Diabetes mellitus    type 2, insulin  . Headache(784.0)   . Hepatic steatosis 01/17/2012  . Hypertension   . Leukemia, acute monocytic, in remission ([place])   . Nephrolithiasis   . Obesity     Patient Active Problem List   Diagnosis Date Noted  . Vaginitis and vulvovaginitis 10/09/2014  . Alopecia 06/26/2014  . Bilateral hip pain 06/19/2014  . Anxiety associated with depression 06/19/2014  . Allergic rhinitis 06/07/2014  . IUD complication ([name]) 74/25/9563  . Exposure to STD 06/07/2014  . Acute promyelocytic leukemia in remission ([place]) 11/19/2012  . Morbid obesity with BMI of 50.0-59.9, adult ([place]) 01/20/2012  . DM (diabetes mellitus), type 2, uncontrolled ([place]) 12/30/2006  . HYPERTENSION, BENIGN ESSENTIAL 12/30/2006    Past Surgical History:  Procedure Laterality Date  . CHOLECYSTECTOMY  01/31/2012   Procedure: LAPAROSCOPIC CHOLECYSTECTOMY WITH INTRAOPERATIVE  CHOLANGIOGRAM;  Surgeon: Marcello Moores A. Cornett, MD;  Location: [facility] OR;  Service: General;  Laterality: N/A;  . PORTACATH PLACEMENT     [place]    OB History    Gravida Para Term Preterm AB Living   _0 0 0 2   SAB TAB Ectopic Multiple Live Births   0 0 0 0 2       Home Medications    Prior to Admission medications   Medication Sig Start Date End Date Taking? Authorizing Provider  ACCU-CHEK FASTCLIX LANCETS MISC 1 each by Does not apply route 3 (three) times daily. Check sugar 3x daily 06/07/14  Yes Renee A Kuneff, DO  Blood Glucose Monitoring Suppl (ACCU-CHEK NANO SMARTVIEW) W/DEVICE KIT Check blood glucose x3 daily. Must take morning fasting blood glucose 06/07/14  Yes Renee A Kuneff, DO  glucose blood (ACCU-CHEK SMARTVIEW) test strip Check sugar 3 x daily 06/07/14  Yes Renee A Kuneff, DO  Insulin Glargine (LANTUS) 100 UNIT/ML Solostar Pen Inject 20 Units into the skin daily at 10 pm. 12/29/15  Yes Nicole Pisciotta, PA-C  Insulin Pen Needle 29G X 8MM MISC As directed 12/29/15  Yes Nicole Pisciotta, PA-C  Insulin Syringe-Needle U-100 (INSULIN SYRINGE 1CC/31GX5/16") 31G X 5/16" 1 ML MISC Use as directed to inject insulin. 01/20/15  Yes Lutricia Feil, PA  levonorgestrel (MIRENA) 20 MCG/24HR IUD 1 each by Intrauterine route continuous.   Yes Historical Provider, MD  lisinopril (PRINIVIL,ZESTRIL) 5 MG tablet Take 1 tablet (5 mg  total) by mouth daily. 06/19/14  Yes Renee A Kuneff, DO  metFORMIN (GLUCOPHAGE) 1000 MG tablet Take 1 tablet (1,000 mg total) by mouth 2 (two) times daily with a meal. 03/29/15  Yes Patrecia Pour, MD  cyclobenzaprine (FLEXERIL) 10 MG tablet Take 1 tablet (10 mg total) by mouth 2 (two) times daily as needed for muscle spasms. 06/16/16   Etta Quill, NP  diazepam (VALIUM) 5 MG tablet Take 1 tablet (5 mg total) by mouth every 6 (six) hours as needed for muscle spasms. Patient not taking: Reported on 02/16/2016 11/18/15   Orpah Greek, MD  HYDROcodone-acetaminophen  (NORCO/VICODIN) 5-325 MG tablet Take 1 tablet by mouth every 4 (four) hours as needed. 05/05/16   Janne Napoleon, NP  insulin detemir (LEVEMIR) 100 UNIT/ML injection Inject 0.2 mLs (20 Units total) into the skin at bedtime. 03/29/15   Patrecia Pour, MD  mupirocin ointment Drue Stager) 2 % Apply to skin bid 05/11/16   Billy Fischer, MD  naproxen (NAPROSYN) 500 MG tablet Take 1 tablet (500 mg total) by mouth 2 (two) times daily. 06/16/16   Etta Quill, NP  sulfamethoxazole-trimethoprim (BACTRIM DS,SEPTRA DS) 800-160 MG tablet Take 1 tablet by mouth 2 (two) times daily. 07/09/16 07/16/16  Tatyana Kirichenko, PA-C  topiramate (TOPAMAX) 25 MG tablet Take one tablet at bedtime for 1 week, then 2 tablets at bedtime for another week. Patient taking differently: Take 25 mg by mouth daily.  02/14/16   Melony Overly, MD    Family History Family History  Problem Relation Age of Onset  . Kidney disease Mother   . Hypertension Mother   . Epilepsy Mother   . Sleep apnea Mother   . Diabetes Father   . Kidney disease Maternal Grandmother   . Diabetes Maternal Grandmother     Social History Social History  Substance Use Topics  . Smoking status: Never Smoker  . Smokeless tobacco: Never Used  . Alcohol use No     Allergies   Ultram [tramadol hcl]   Review of Systems Review of Systems  Constitutional: Negative.   Respiratory: Negative for shortness of breath.   Cardiovascular: Negative for chest pain.  Genitourinary: Negative.  Negative for pelvic pain.  Musculoskeletal: Positive for gait problem. Negative for back pain and joint swelling.  Skin: Negative.   All other systems reviewed and are negative.    Physical Exam Triage Vital Signs ED Triage Vitals  Enc Vitals Group     BP 07/13/16 1619 124/83     Pulse Rate 07/13/16 1619 91     Resp 07/13/16 1619 20     Temp 07/13/16 1619 98.5 F (36.9 C)     Temp Source 07/13/16 1619 Oral     SpO2 07/13/16 1619 98 %     Weight --      Height --       Head Circumference --      Peak Flow --      Pain Score 07/13/16 1626 10     Pain Loc --      Pain Edu? --      Excl. in [place]? --    No data found.   Updated Vital Signs BP 124/83 (BP Location: Left Arm)   Pulse 91   Temp 98.5 F (36.9 C) (Oral)   Resp 20   SpO2 98%   Visual Acuity Right Eye Distance:   Left Eye Distance:   Bilateral Distance:    Right Eye Near:  Left Eye Near:    Bilateral Near:     Physical Exam  Constitutional: She is oriented to person, place, and time. She appears well-developed and well-nourished. No distress.  Musculoskeletal: She exhibits tenderness. She exhibits no deformity.       Right hip: She exhibits decreased range of motion, decreased strength and tenderness. She exhibits no bony tenderness, no swelling, no crepitus and no deformity.       Legs: Neurological: She is alert and oriented to person, place, and time.  Skin: Skin is warm and dry.  Nursing note and vitals reviewed.    UC Treatments / Results  Labs (all labs ordered are listed, but only abnormal results are displayed) Labs Reviewed - No data to display  EKG  EKG Interpretation None       Radiology No results found. X-rays reviewed and report per radiologist.  Procedures Procedures (including critical care time)  Medications Ordered in UC Medications - No data to display   Initial Impression / Assessment and Plan / UC Course  I have reviewed the triage vital signs and the nursing notes.  Pertinent labs & imaging results that were available during my care of the patient were reviewed by me and considered in my medical decision making (see chart for details).  Clinical Course      Final Clinical Impressions(s) / UC Diagnoses   Final diagnoses:  Pain aggravated by walking    New Prescriptions New Prescriptions   No medications on file     Billy Fischer, MD 07/13/16 [id number]

## 2016-07-13 NOTE — ED Notes (Signed)
Pt   Asked  How  Much   Longer pt   Advised  She   Was  Waiting  On x  Ray  Results

## 2016-07-13 NOTE — ED Triage Notes (Signed)
The patient presented to the [facility] with a complaint of right hip pain and swelling for 2 days. The patient denied any known injury but did state that the pain is recurrent yet worse the last 2 days.

## 2016-07-14 ENCOUNTER — Encounter (HOSPITAL_COMMUNITY): Payer: Self-pay | Admitting: Emergency Medicine

## 2016-07-14 ENCOUNTER — Emergency Department (HOSPITAL_COMMUNITY)
Admission: EM | Admit: 2016-07-14 | Discharge: 2016-07-14 | Disposition: A | Discharge status: Home / Self Care | Payer: Medicaid Other | Attending: Emergency Medicine | Admitting: Emergency Medicine

## 2016-07-14 DIAGNOSIS — E119 Type 2 diabetes mellitus without complications: Secondary | ICD-10-CM | POA: Insufficient documentation

## 2016-07-14 DIAGNOSIS — X509XXA Other and unspecified overexertion or strenuous movements or postures, initial encounter: Secondary | ICD-10-CM | POA: Insufficient documentation

## 2016-07-14 DIAGNOSIS — Z7984 Long term (current) use of oral hypoglycemic drugs: Secondary | ICD-10-CM | POA: Insufficient documentation

## 2016-07-14 DIAGNOSIS — I10 Essential (primary) hypertension: Secondary | ICD-10-CM | POA: Diagnosis not present

## 2016-07-14 DIAGNOSIS — Y9389 Activity, other specified: Secondary | ICD-10-CM | POA: Insufficient documentation

## 2016-07-14 DIAGNOSIS — Y92007 Garden or yard of unspecified non-institutional (private) residence as the place of occurrence of the external cause: Secondary | ICD-10-CM | POA: Insufficient documentation

## 2016-07-14 DIAGNOSIS — M25551 Pain in right hip: Secondary | ICD-10-CM | POA: Insufficient documentation

## 2016-07-14 DIAGNOSIS — Z794 Long term (current) use of insulin: Secondary | ICD-10-CM | POA: Insufficient documentation

## 2016-07-14 DIAGNOSIS — Y999 Unspecified external cause status: Secondary | ICD-10-CM | POA: Diagnosis not present

## 2016-07-14 MED ORDER — METHOCARBAMOL 500 MG PO TABS: 500.0000 mg | ORAL_TABLET | Freq: Two times a day (BID) | ORAL | 0 refills | Status: DC

## 2016-07-14 MED ORDER — MELOXICAM 7.5 MG PO TABS: 15.0000 mg | ORAL_TABLET | Freq: Every day | ORAL | 0 refills | Status: DC

## 2016-07-14 NOTE — Discharge Instructions (Signed)
Take the prescribed medication as directed. Recommend to use ice on your hip, heat on the muscles of your right leg. Follow-up with orthopedics if symptoms persist. Return to the ED for new or worsening symptoms.

## 2016-07-14 NOTE — ED Triage Notes (Signed)
Pt states that approx 3 days ago she was outside playing with kids and felt a pop in hip.  Pain radiating down leg.  States it feels like she has pulled a muscle in hip area.  Denies injury or accident.  States that hip and leg are very sore and swollen. Hurts to ambulate.

## 2016-07-14 NOTE — ED Provider Notes (Signed)
[facility] DEPT Provider Note   CSN: [id number] Arrival date & time: 07/14/16  7253     History   Chief Complaint Chief Complaint  Patient presents with  . Hip Pain    right  . Leg Pain    right    HPI Melissa Washington is a 30 y.o. female.  The history is provided by the patient and medical records.    30 y.o. F with hx of collagen vascular disease, fatty liver, HTN, obesity, leukemia in remission, presenting to the ED for hip pain.  Seen for same at urgent care yesterday, however had to leave in the middle of her appt because of her children.  Patient reports over the weekend she is playing in the yard with her children and felt a pop in her right hip. States she has some tension along her right upper leg. States pain is worse with weightbearing and a relation. Pain is also worse with bending at the hip. She denies any numbness or weakness of her right leg. No back pain. No bowel or bladder incontinence. She's not tried any medications for her symptoms. Does report history of right hip bursitis in the past, was seen by [facility] orthopedics for this.  Past Medical History:  Diagnosis Date  . 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 Bacteremia associated with IV line ([name]) 05/01/2013   Port-a-cath infection, removed 8/6 by IR. Ecoli grew out in cultures. Sent home with Ceftriaxone to complete 14 day course.   . Bacteremia due to Escherichia coli 05/01/2013   Port-a-cath infection, removed 8/6 by IR. Ecoli grew out in cultures. Sent home with Ceftriaxone to complete 14 day course.   . Cholelithiasis 01/17/2012  . Collagen vascular disease ([place])   . Diabetes mellitus    type 2, insulin  . Headache(784.0)   . Hepatic steatosis 01/17/2012  . Hypertension   . Leukemia, acute monocytic, in remission ([place])   . Nephrolithiasis   . Obesity     Patient Active Problem List   Diagnosis Date Noted  . Vaginitis and vulvovaginitis 10/09/2014  . Alopecia 06/26/2014  .  Bilateral hip pain 06/19/2014  . Anxiety associated with depression 06/19/2014  . Allergic rhinitis 06/07/2014  . IUD complication ([place]) 66/44/0347  . Exposure to STD 06/07/2014  . Acute promyelocytic leukemia in remission ([place]) 11/19/2012  . Morbid obesity with BMI of 50.0-59.9, adult ([place]) 01/20/2012  . DM (diabetes mellitus), type 2, uncontrolled ([place]) 12/30/2006  . HYPERTENSION, BENIGN ESSENTIAL 12/30/2006    Past Surgical History:  Procedure Laterality Date  . CHOLECYSTECTOMY  01/31/2012   Procedure: LAPAROSCOPIC CHOLECYSTECTOMY WITH INTRAOPERATIVE CHOLANGIOGRAM;  Surgeon: Marcello Moores A. Cornett, MD;  Location: [facility] OR;  Service: General;  Laterality: N/A;  . PORTACATH PLACEMENT     [name]    OB History    Gravida Para Term Preterm AB Living   '2 2 2 ' 0 0 2   SAB TAB Ectopic Multiple Live Births   0 0 0 0 2       Home Medications    Prior to Admission medications   Medication Sig Start Date End Date Taking? Authorizing Provider  ACCU-CHEK FASTCLIX LANCETS MISC 1 each by Does not apply route 3 (three) times daily. Check sugar 3x daily 06/07/14   Renee A Kuneff, DO  Blood Glucose Monitoring Suppl (ACCU-CHEK NANO SMARTVIEW) W/DEVICE KIT Check blood glucose x3 daily. Must take morning fasting blood glucose 06/07/14   Renee A Kuneff, DO  cyclobenzaprine (  FLEXERIL) 10 MG tablet Take 1 tablet (10 mg total) by mouth 2 (two) times daily as needed for muscle spasms. 06/16/16   Etta Quill, NP  diazepam (VALIUM) 5 MG tablet Take 1 tablet (5 mg total) by mouth every 6 (six) hours as needed for muscle spasms. Patient not taking: Reported on 02/16/2016 11/18/15   Orpah Greek, MD  glucose blood (ACCU-CHEK SMARTVIEW) test strip Check sugar 3 x daily 06/07/14   Renee A Kuneff, DO  HYDROcodone-acetaminophen (NORCO/VICODIN) 5-325 MG tablet Take 1 tablet by mouth every 4 (four) hours as needed. 05/05/16   Janne Napoleon, NP  insulin detemir (LEVEMIR) 100 UNIT/ML injection Inject 0.2 mLs (20 Units total)  into the skin at bedtime. 03/29/15   Patrecia Pour, MD  Insulin Glargine (LANTUS) 100 UNIT/ML Solostar Pen Inject 20 Units into the skin daily at 10 pm. 12/29/15   Elmyra Ricks Pisciotta, PA-C  Insulin Pen Needle 29G X 8MM MISC As directed 12/29/15   Elmyra Ricks Pisciotta, PA-C  Insulin Syringe-Needle U-100 (INSULIN SYRINGE 1CC/31GX5/16") 31G X 5/16" 1 ML MISC Use as directed to inject insulin. 01/20/15   Lutricia Feil, PA  levonorgestrel (MIRENA) 20 MCG/24HR IUD 1 each by Intrauterine route continuous.    Historical Provider, MD  lisinopril (PRINIVIL,ZESTRIL) 5 MG tablet Take 1 tablet (5 mg total) by mouth daily. 06/19/14   Renee A Kuneff, DO  meloxicam (MOBIC) 7.5 MG tablet Take 1 tablet (7.5 mg total) by mouth 2 (two) times daily after a meal. 07/13/16   Billy Fischer, MD  metFORMIN (GLUCOPHAGE) 1000 MG tablet Take 1 tablet (1,000 mg total) by mouth 2 (two) times daily with a meal. 03/29/15   Patrecia Pour, MD  mupirocin ointment (BACTROBAN) 2 % Apply to skin bid 05/11/16   Billy Fischer, MD  naproxen (NAPROSYN) 500 MG tablet Take 1 tablet (500 mg total) by mouth 2 (two) times daily. 06/16/16   Etta Quill, NP  sulfamethoxazole-trimethoprim (BACTRIM DS,SEPTRA DS) 800-160 MG tablet Take 1 tablet by mouth 2 (two) times daily. 07/09/16 07/16/16  Tatyana Kirichenko, PA-C  topiramate (TOPAMAX) 25 MG tablet Take one tablet at bedtime for 1 week, then 2 tablets at bedtime for another week. Patient taking differently: Take 25 mg by mouth daily.  02/14/16   Melony Overly, MD    Family History Family History  Problem Relation Age of Onset  . Kidney disease Mother   . Hypertension Mother   . Epilepsy Mother   . Sleep apnea Mother   . Diabetes Father   . Kidney disease Maternal Grandmother   . Diabetes Maternal Grandmother     Social History Social History  Substance Use Topics  . Smoking status: Never Smoker  . Smokeless tobacco: Never Used  . Alcohol use No     Allergies   Ultram [tramadol  hcl]   Review of Systems Review of Systems  Musculoskeletal: Positive for arthralgias.  All other systems reviewed and are negative.    Physical Exam Updated Vital Signs BP 110/92 (BP Location: Right Arm)   Pulse 84   Temp 98.6 F (37 C) (Oral)   Resp 19   Ht '5\' 2"'  (1.575 m)   Wt 122.5 kg   LMP  (LMP Unknown)   SpO2 100%   BMI 49.38 kg/m   Physical Exam  Constitutional: She is oriented to person, place, and time. She appears well-developed and well-nourished.  HENT:  Head: Normocephalic and atraumatic.  Mouth/Throat: Oropharynx is clear and moist.  Eyes: Conjunctivae and EOM are normal. Pupils are equal, round, and reactive to light.  Neck: Normal range of motion.  Cardiovascular: Normal rate, regular rhythm and normal heart sounds.   Pulmonary/Chest: Effort normal and breath sounds normal. No respiratory distress. She has no wheezes.  Abdominal: Soft. Bowel sounds are normal.  Musculoskeletal: Normal range of motion.  Right hip with mild swelling/tenderness along lateral aspect of joint line; no gross deformity or leg shortening; tension noted along anterior quadriceps without definitive spasm; no bruising noted  Neurological: She is alert and oriented to person, place, and time.  Skin: Skin is warm and dry.  Psychiatric: She has a normal mood and affect.  Nursing note and vitals reviewed.    ED Treatments / Results  Labs (all labs ordered are listed, but only abnormal results are displayed) Labs Reviewed - No data to display  EKG  EKG Interpretation None       Radiology Dg Hip Unilat With Pelvis 2-3 Views Right  Result Date: 07/13/2016 CLINICAL DATA:  Fall. EXAM: DG HIP (WITH OR WITHOUT PELVIS) 2-3V RIGHT COMPARISON:  No recent prior. FINDINGS: No acute bony or joint abnormality identified. No evidence of fracture or dislocation. IUD noted. IMPRESSION: 1. No acute bony or joint abnormality. 2. IUD noted. Electronically Signed   By: Marcello Moores  Register   On:  07/13/2016 17:08    Procedures Procedures (including critical care time)  Medications Ordered in ED Medications - No data to display   Initial Impression / Assessment and Plan / ED Course  I have reviewed the triage vital signs and the nursing notes.  Pertinent labs & imaging results that were available during my care of the patient were reviewed by me and considered in my medical decision making (see chart for details).  Clinical Course   30 year old female here with right hip and leg pain.  States she was playing in the yard with her children over the weekend and felt a "pop". She was seen at urgent care yesterday, however left in the middle of her appointment. She had x-rays performed which I have reviewed, no acute bony findings. She is ambulatory here, but reports increased pain when doing so.  No neurologic deficits.  No bony deformity of the hip or leg shortening. There is some pain along the quadriceps muscle. Patient does have history of bursitis, feel she may have another episode of this as well as muscle strain. Discharge home with anti-inflammatories as well as muscle relaxer.  Encouraged rice routine/heat therapy.  Follow-up with orthopedics if no improvement.  Discussed plan with patient, she acknowledged understanding and agreed with plan of care.  Return precautions given for new or worsening symptoms.  Final Clinical Impressions(s) / ED Diagnoses   Final diagnoses:  Right hip pain    New Prescriptions Robaxin 570m BID Mobic 7.553mBID   LiLarene PickettPA-C 07/14/16 [id number]  DaPattricia BossMD 07/15/16 [id number]

## 2016-07-14 NOTE — ED Notes (Signed)
Pt wheeled back from waiting area and assisted into bed. Pt assisted into gown and on bp and o2 monitor.

## 2016-08-31 ENCOUNTER — Ambulatory Visit (HOSPITAL_COMMUNITY)
Admission: EM | Admit: 2016-08-31 | Discharge: 2016-08-31 | Disposition: A | Discharge status: Home / Self Care | Payer: Medicare Other | Attending: Family Medicine | Admitting: Family Medicine

## 2016-08-31 ENCOUNTER — Encounter (HOSPITAL_COMMUNITY): Payer: Self-pay | Admitting: Emergency Medicine

## 2016-08-31 DIAGNOSIS — E1165 Type 2 diabetes mellitus with hyperglycemia: Secondary | ICD-10-CM | POA: Diagnosis not present

## 2016-08-31 DIAGNOSIS — M79604 Pain in right leg: Secondary | ICD-10-CM

## 2016-08-31 DIAGNOSIS — Z79899 Other long term (current) drug therapy: Secondary | ICD-10-CM | POA: Diagnosis not present

## 2016-08-31 DIAGNOSIS — Z794 Long term (current) use of insulin: Secondary | ICD-10-CM | POA: Insufficient documentation

## 2016-08-31 DIAGNOSIS — I10 Essential (primary) hypertension: Secondary | ICD-10-CM | POA: Insufficient documentation

## 2016-08-31 DIAGNOSIS — J029 Acute pharyngitis, unspecified: Secondary | ICD-10-CM

## 2016-08-31 LAB — POCT RAPID STREP A: STREPTOCOCCUS, GROUP A SCREEN (DIRECT): NEGATIVE

## 2016-08-31 MED ORDER — CYCLOBENZAPRINE HCL 10 MG PO TABS: 10.0000 mg | ORAL_TABLET | Freq: Every day | ORAL | 0 refills | Status: DC

## 2016-08-31 MED ORDER — AMOXICILLIN 875 MG PO TABS: 875.0000 mg | ORAL_TABLET | Freq: Two times a day (BID) | ORAL | 0 refills | Status: DC

## 2016-08-31 MED ORDER — ACETAMINOPHEN 325 MG PO TABS
ORAL_TABLET | ORAL | Status: AC
  Filled 2016-08-31: qty 2

## 2016-08-31 MED ORDER — ACETAMINOPHEN 325 MG PO TABS
650.0000 mg | ORAL_TABLET | Freq: Once | ORAL | Status: AC
  Administered 2016-08-31: 650 mg via ORAL

## 2016-08-31 MED ORDER — TOPIRAMATE 25 MG PO TABS: 25.0000 mg | ORAL_TABLET | Freq: Every day | ORAL | 11 refills | Status: DC

## 2016-08-31 NOTE — ED Provider Notes (Signed)
[facility]    CSN: [id number] Arrival date & time: 08/31/16  1834     History   Chief Complaint Chief Complaint  Patient presents with  . Sore Throat  . Leg Pain    HPI Melissa Washington is a 30 y.o. female.   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 She's had 3 days of fever, sore throat, and cough. She denies any GI symptoms. The cough is nonproductive.  She also has 3 days of right thigh pain after slipping on the hover board.  She's had no skin break, bleeding, swelling from that fall.  Patient has diabetes and hypertension.      Past Medical History:  Diagnosis Date  . 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 Bacteremia associated with IV line ([place]) 05/01/2013   Port-a-cath infection, removed 8/6 by IR. Ecoli grew out in cultures. Sent home with Ceftriaxone to complete 14 day course.   . Bacteremia due to Escherichia coli 05/01/2013   Port-a-cath infection, removed 8/6 by IR. Ecoli grew out in cultures. Sent home with Ceftriaxone to complete 14 day course.   . Cholelithiasis 01/17/2012  . Collagen vascular disease ([place])   . Diabetes mellitus    type 2, insulin  . Headache(784.0)   . Hepatic steatosis 01/17/2012  . Hypertension   . Leukemia, acute monocytic, in remission ([place])   . Nephrolithiasis   . Obesity     Patient Active Problem List   Diagnosis Date Noted  . Vaginitis and vulvovaginitis 10/09/2014  . Alopecia 06/26/2014  . Bilateral hip pain 06/19/2014  . Anxiety associated with depression 06/19/2014  . Allergic rhinitis 06/07/2014  . IUD complication ([place]) 26/37/8588  . Exposure to STD 06/07/2014  . Acute promyelocytic leukemia in remission ([place]) 11/19/2012  . Morbid obesity with BMI of 50.0-59.9, adult ([place]) 01/20/2012  . DM (diabetes mellitus), type 2, uncontrolled ([place]) 12/30/2006  . HYPERTENSION, BENIGN ESSENTIAL 12/30/2006    Past Surgical History:  Procedure Laterality Date  . CHOLECYSTECTOMY   01/31/2012   Procedure: LAPAROSCOPIC CHOLECYSTECTOMY WITH INTRAOPERATIVE CHOLANGIOGRAM;  Surgeon: Marcello Moores A. Cornett, MD;  Location: [facility] OR;  Service: General;  Laterality: N/A;  . PORTACATH PLACEMENT     [place]    OB History    Gravida Para Term Preterm AB Living   '2 2 2 ' 0 0 2   SAB TAB Ectopic Multiple Live Births   0 0 0 0 2       Home Medications    Prior to Admission medications   Medication Sig Start Date End Date Taking? Authorizing Provider  ACCU-CHEK FASTCLIX LANCETS MISC 1 each by Does not apply route 3 (three) times daily. Check sugar 3x daily 06/07/14  Yes Renee A Kuneff, DO  Blood Glucose Monitoring Suppl (ACCU-CHEK NANO SMARTVIEW) W/DEVICE KIT Check blood glucose x3 daily. Must take morning fasting blood glucose 06/07/14  Yes Renee A Kuneff, DO  glucose blood (ACCU-CHEK SMARTVIEW) test strip Check sugar 3 x daily 06/07/14  Yes Renee A Kuneff, DO  Insulin Glargine (LANTUS) 100 UNIT/ML Solostar Pen Inject 20 Units into the skin daily at 10 pm. 12/29/15  Yes Nicole Pisciotta, PA-C  Insulin Pen Needle 29G X 8MM MISC As directed 12/29/15  Yes Nicole Pisciotta, PA-C  Insulin Syringe-Needle U-100 (INSULIN SYRINGE 1CC/31GX5/16") 31G X 5/16" 1 ML MISC Use as directed to inject insulin. 01/20/15  Yes Lutricia Feil, PA  levonorgestrel (MIRENA) 20 MCG/24HR IUD 1 each by Intrauterine  route continuous.   Yes Historical Provider, MD  lisinopril (PRINIVIL,ZESTRIL) 5 MG tablet Take 1 tablet (5 mg total) by mouth daily. 06/19/14  Yes Renee A Kuneff, DO  metFORMIN (GLUCOPHAGE) 1000 MG tablet Take 1 tablet (1,000 mg total) by mouth 2 (two) times daily with a meal. 03/29/15  Yes Patrecia Pour, MD  amoxicillin (AMOXIL) 875 MG tablet Take 1 tablet (875 mg total) by mouth 2 (two) times daily. 08/31/16   Robyn Haber, MD  cyclobenzaprine (FLEXERIL) 10 MG tablet Take 1 tablet (10 mg total) by mouth at bedtime. 08/31/16   Robyn Haber, MD  HYDROcodone-acetaminophen (NORCO/VICODIN) 5-325 MG tablet Take 1  tablet by mouth every 4 (four) hours as needed. 05/05/16   Janne Napoleon, NP  insulin detemir (LEVEMIR) 100 UNIT/ML injection Inject 0.2 mLs (20 Units total) into the skin at bedtime. 03/29/15   Patrecia Pour, MD  meloxicam (MOBIC) 7.5 MG tablet Take 2 tablets (15 mg total) by mouth daily. 07/14/16   Larene Pickett, PA-C  mupirocin ointment Drue Stager) 2 % Apply to skin bid 05/11/16   Billy Fischer, MD  naproxen (NAPROSYN) 500 MG tablet Take 1 tablet (500 mg total) by mouth 2 (two) times daily. 06/16/16   Etta Quill, NP  topiramate (TOPAMAX) 25 MG tablet Take 1 tablet (25 mg total) by mouth daily. 08/31/16   Robyn Haber, MD    Family History Family History  Problem Relation Age of Onset  . Kidney disease Mother   . Hypertension Mother   . Epilepsy Mother   . Sleep apnea Mother   . Diabetes Father   . Kidney disease Maternal Grandmother   . Diabetes Maternal Grandmother     Social History Social History  Substance Use Topics  . Smoking status: Never Smoker  . Smokeless tobacco: Never Used  . Alcohol use No     Allergies   Ultram [tramadol hcl]   Review of Systems Review of Systems  Constitutional: Positive for fatigue and fever.  HENT: Positive for sore throat.   Eyes: Negative.   Respiratory: Positive for cough.   Cardiovascular: Negative.   Gastrointestinal: Negative.   Musculoskeletal: Positive for myalgias.  Neurological: Negative.      Physical Exam Triage Vital Signs ED Triage Vitals  Enc Vitals Group     BP 08/31/16 1905 (!) 154/104     Pulse Rate 08/31/16 1905 110     Resp 08/31/16 1905 20     Temp 08/31/16 1905 100.9 F (38.3 C)     Temp Source 08/31/16 1905 Oral     SpO2 08/31/16 1905 100 %     Weight --      Height --      Head Circumference --      Peak Flow --      Pain Score 08/31/16 1910 10     Pain Loc --      Pain Edu? --      Excl. in [place]? --    No data found.   Updated Vital Signs BP (!) 154/104 (BP Location: Left Wrist)   Pulse 110    Temp 100.9 F (38.3 C) (Oral)   Resp 20   SpO2 100%    Physical Exam  Constitutional: She is oriented to person, place, and time. She appears well-developed and well-nourished.  HENT:  Head: Normocephalic.  Right Ear: External ear normal.  Left Ear: External ear normal.  Marked erythema and few exudates in the right posterior pharynx  Eyes:  Conjunctivae and EOM are normal.  Neck: Normal range of motion. Neck supple.  Cardiovascular: Normal rate, regular rhythm and normal heart sounds.   Pulmonary/Chest: Effort normal and breath sounds normal.  Musculoskeletal: Normal range of motion. She exhibits tenderness. She exhibits no edema.  Tender right hamstring  Neurological: She is alert and oriented to person, place, and time.  Skin: Skin is warm and dry.  Nursing note and vitals reviewed.    UC Treatments / Results  Labs (all labs ordered are listed, but only abnormal results are displayed) Labs Reviewed  POCT RAPID STREP A    EKG  EKG Interpretation None       Radiology No results found.  Procedures Procedures (including critical care time)  Medications Ordered in UC Medications  acetaminophen (TYLENOL) tablet 650 mg (650 mg Oral Given 08/31/16 1915)     Initial Impression / Assessment and Plan / UC Course  I have reviewed the triage vital signs and the nursing notes.  Pertinent labs & imaging results that were available during my care of the patient were reviewed by me and considered in my medical decision making (see chart for details).  Clinical Course      Final Clinical Impressions(s) / UC Diagnoses   Final diagnoses:  Pharyngitis, unspecified etiology  Right leg pain    New Prescriptions New Prescriptions   AMOXICILLIN (AMOXIL) 875 MG TABLET    Take 1 tablet (875 mg total) by mouth 2 (two) times daily.   CYCLOBENZAPRINE (FLEXERIL) 10 MG TABLET    Take 1 tablet (10 mg total) by mouth at bedtime.     Robyn Haber, MD 08/31/16 [phone]

## 2016-08-31 NOTE — ED Triage Notes (Signed)
The patient presented to the [facility] with a complaint of a headache, sore throat and fever. The patient reported that the headache and sore throat started 2 days ago and the fever today.   The patient also complained of recurring right thigh pain that started today but has occurred before.

## 2016-09-03 LAB — CULTURE, GROUP A STREP (THRC)

## 2016-09-11 ENCOUNTER — Encounter (HOSPITAL_COMMUNITY): Payer: Self-pay

## 2016-09-11 ENCOUNTER — Emergency Department (HOSPITAL_COMMUNITY): Payer: Medicare Other

## 2016-09-11 ENCOUNTER — Emergency Department (HOSPITAL_COMMUNITY)
Admission: EM | Admit: 2016-09-11 | Discharge: 2016-09-11 | Disposition: A | Discharge status: Home / Self Care | Payer: Medicare Other | Attending: Emergency Medicine | Admitting: Emergency Medicine

## 2016-09-11 DIAGNOSIS — Z794 Long term (current) use of insulin: Secondary | ICD-10-CM | POA: Insufficient documentation

## 2016-09-11 DIAGNOSIS — Z79899 Other long term (current) drug therapy: Secondary | ICD-10-CM | POA: Diagnosis not present

## 2016-09-11 DIAGNOSIS — E119 Type 2 diabetes mellitus without complications: Secondary | ICD-10-CM | POA: Insufficient documentation

## 2016-09-11 DIAGNOSIS — J181 Lobar pneumonia, unspecified organism: Secondary | ICD-10-CM

## 2016-09-11 DIAGNOSIS — I10 Essential (primary) hypertension: Secondary | ICD-10-CM | POA: Diagnosis not present

## 2016-09-11 DIAGNOSIS — R05 Cough: Secondary | ICD-10-CM | POA: Diagnosis present

## 2016-09-11 DIAGNOSIS — J189 Pneumonia, unspecified organism: Secondary | ICD-10-CM | POA: Diagnosis not present

## 2016-09-11 LAB — BASIC METABOLIC PANEL
ANION GAP: 11 (ref 5–15)
BUN: 11 mg/dL (ref 6–20)
CO2: 26 mmol/L (ref 22–32)
Calcium: 9.5 mg/dL (ref 8.9–10.3)
Chloride: 101 mmol/L (ref 101–111)
Creatinine, Ser: 0.72 mg/dL (ref 0.44–1.00)
Glucose, Bld: 264 mg/dL — ABNORMAL HIGH (ref 65–99)
POTASSIUM: 4.4 mmol/L (ref 3.5–5.1)
SODIUM: 138 mmol/L (ref 135–145)

## 2016-09-11 LAB — I-STAT TROPONIN, ED: TROPONIN I, POC: 0 ng/mL (ref 0.00–0.08)

## 2016-09-11 LAB — CBC WITH DIFFERENTIAL/PLATELET
Basophils Absolute: 0 10*3/uL (ref 0.0–0.1)
Basophils Relative: 0 %
EOS ABS: 0.2 10*3/uL (ref 0.0–0.7)
Eosinophils Relative: 2 %
HCT: 32.9 % — ABNORMAL LOW (ref 36.0–46.0)
HEMOGLOBIN: 11.4 g/dL — AB (ref 12.0–15.0)
Lymphocytes Relative: 38 %
Lymphs Abs: 3.2 10*3/uL (ref 0.7–4.0)
MCH: 28.4 pg (ref 26.0–34.0)
MCHC: 34.7 g/dL (ref 30.0–36.0)
MCV: 81.8 fL (ref 78.0–100.0)
MONOS PCT: 4 %
Monocytes Absolute: 0.3 10*3/uL (ref 0.1–1.0)
NEUTROS PCT: 56 %
Neutro Abs: 4.8 10*3/uL (ref 1.7–7.7)
Platelets: 289 10*3/uL (ref 150–400)
RBC: 4.02 MIL/uL (ref 3.87–5.11)
RDW: 12.7 % (ref 11.5–15.5)
WBC: 8.6 10*3/uL (ref 4.0–10.5)

## 2016-09-11 MED ORDER — BENZONATATE 200 MG PO CAPS: 200.0000 mg | ORAL_CAPSULE | Freq: Three times a day (TID) | ORAL | 0 refills | Status: DC | PRN

## 2016-09-11 MED ORDER — ALBUTEROL SULFATE HFA 108 (90 BASE) MCG/ACT IN AERS
2.0000 | INHALATION_SPRAY | RESPIRATORY_TRACT | Status: DC | PRN
  Administered 2016-09-11: 2 via RESPIRATORY_TRACT
  Filled 2016-09-11: qty 6.7

## 2016-09-11 MED ORDER — LEVOFLOXACIN 750 MG PO TABS: 750.0000 mg | ORAL_TABLET | Freq: Every day | ORAL | 0 refills | Status: DC

## 2016-09-11 MED ORDER — LEVOFLOXACIN 750 MG PO TABS
750.0000 mg | ORAL_TABLET | Freq: Once | ORAL | Status: AC
  Administered 2016-09-11: 750 mg via ORAL
  Filled 2016-09-11: qty 1

## 2016-09-11 NOTE — ED Triage Notes (Signed)
Pt states that CP started yesterday, R sided, denies n/v , some SOB, pt has has a nonproductive cough for about one week.

## 2016-09-11 NOTE — ED Provider Notes (Signed)
[facility] DEPT Provider Note   CSN: [id number] Arrival date & time: 09/11/16  0115  By signing my name below, I, Melissa Washington, attest that this documentation has been prepared under the direction and in the presence of Orpah Greek, MD.  Electronically Signed: [name], ED Scribe. 09/11/16. 2:41 AM.    History   Chief Complaint Chief Complaint  Patient presents with  . Chest Pain  . Cough   The history is provided by the patient. No language interpreter was used.   HPI Comments: Melissa Washington is a 30 y.o. female who has a PMhx of acute promyelocytic leukemia, DMII, HTN, hepatic steatosis, and obesity presents to the Emergency Department complaining of gradual worsening, moderate, dry cough x 1 week. She reports associated pleuritic chest pain x 2 days, fever, chills, and shortness of breath. Pt has a hx of pneumonia in the past. 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 She denies a hx of asthma.   Past Medical History:  Diagnosis Date  . 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 Bacteremia associated with IV line ([place]) 05/01/2013   Port-a-cath infection, removed 8/6 by IR. Ecoli grew out in cultures. Sent home with Ceftriaxone to complete 14 day course.   . Bacteremia due to Escherichia coli 05/01/2013   Port-a-cath infection, removed 8/6 by IR. Ecoli grew out in cultures. Sent home with Ceftriaxone to complete 14 day course.   . Cholelithiasis 01/17/2012  . Collagen vascular disease ([place])   . Diabetes mellitus    type 2, insulin  . Headache(784.0)   . Hepatic steatosis 01/17/2012  . Hypertension   . Leukemia, acute monocytic, in remission ([place])   . Nephrolithiasis   . Obesity     Patient Active Problem List   Diagnosis Date Noted  . Vaginitis and vulvovaginitis 10/09/2014  . Alopecia 06/26/2014  . Bilateral hip pain 06/19/2014  . Anxiety associated with depression 06/19/2014  .  Allergic rhinitis 06/07/2014  . IUD complication ([place]) 78/47/8412  . Exposure to STD 06/07/2014  . Acute promyelocytic leukemia in remission ([place]) 11/19/2012  . Morbid obesity with BMI of 50.0-59.9, adult ([place]) 01/20/2012  . DM (diabetes mellitus), type 2, uncontrolled ([place]) 12/30/2006  . HYPERTENSION, BENIGN ESSENTIAL 12/30/2006    Past Surgical History:  Procedure Laterality Date  . CHOLECYSTECTOMY  01/31/2012   Procedure: LAPAROSCOPIC CHOLECYSTECTOMY WITH INTRAOPERATIVE CHOLANGIOGRAM;  Surgeon: Marcello Moores A. Cornett, MD;  Location: [facility] OR;  Service: General;  Laterality: N/A;  . PORTACATH PLACEMENT     [place]    OB History    Gravida Para Term Preterm AB Living   _0 0 0 2   SAB TAB Ectopic Multiple Live Births   0 0 0 0 2       Home Medications    Prior to Admission medications   Medication Sig Start Date End Date Taking? Authorizing Provider  ACCU-CHEK FASTCLIX LANCETS MISC 1 each by Does not apply route 3 (three) times daily. Check sugar 3x daily 06/07/14   Renee A Kuneff, DO  amoxicillin (AMOXIL) 875 MG tablet Take 1 tablet (875 mg total) by mouth 2 (two) times daily. 08/31/16   Robyn Haber, MD  benzonatate (TESSALON) 200 MG capsule Take 1 capsule (200 mg total) by mouth 3 (three) times daily as needed for cough. 09/11/16   Orpah Greek, MD  Blood Glucose Monitoring Suppl (ACCU-CHEK NANO SMARTVIEW) W/DEVICE KIT Check blood  glucose x3 daily. Must take morning fasting blood glucose 06/07/14   Renee A Kuneff, DO  cyclobenzaprine (FLEXERIL) 10 MG tablet Take 1 tablet (10 mg total) by mouth at bedtime. 08/31/16   Robyn Haber, MD  glucose blood (ACCU-CHEK SMARTVIEW) test strip Check sugar 3 x daily 06/07/14   Renee A Kuneff, DO  HYDROcodone-acetaminophen (NORCO/VICODIN) 5-325 MG tablet Take 1 tablet by mouth every 4 (four) hours as needed. 05/05/16   Janne Napoleon, NP  insulin detemir (LEVEMIR) 100 UNIT/ML injection Inject 0.2 mLs (20 Units total) into the skin at bedtime.  03/29/15   Patrecia Pour, MD  Insulin Glargine (LANTUS) 100 UNIT/ML Solostar Pen Inject 20 Units into the skin daily at 10 pm. 12/29/15   Elmyra Ricks Pisciotta, PA-C  Insulin Pen Needle 29G X 8MM MISC As directed 12/29/15   Elmyra Ricks Pisciotta, PA-C  Insulin Syringe-Needle U-100 (INSULIN SYRINGE 1CC/31GX5/16") 31G X 5/16" 1 ML MISC Use as directed to inject insulin. 01/20/15   Audelia Hives Presson, PA  levofloxacin (LEVAQUIN) 750 MG tablet Take 1 tablet (750 mg total) by mouth daily. 09/11/16   Orpah Greek, MD  levonorgestrel (MIRENA) 20 MCG/24HR IUD 1 each by Intrauterine route continuous.    Historical Provider, MD  lisinopril (PRINIVIL,ZESTRIL) 5 MG tablet Take 1 tablet (5 mg total) by mouth daily. 06/19/14   Renee A Kuneff, DO  meloxicam (MOBIC) 7.5 MG tablet Take 2 tablets (15 mg total) by mouth daily. 07/14/16   Larene Pickett, PA-C  metFORMIN (GLUCOPHAGE) 1000 MG tablet Take 1 tablet (1,000 mg total) by mouth 2 (two) times daily with a meal. 03/29/15   Patrecia Pour, MD  mupirocin ointment Drue Stager) 2 % Apply to skin bid 05/11/16   Billy Fischer, MD  naproxen (NAPROSYN) 500 MG tablet Take 1 tablet (500 mg total) by mouth 2 (two) times daily. 06/16/16   Etta Quill, NP  topiramate (TOPAMAX) 25 MG tablet Take 1 tablet (25 mg total) by mouth daily. 08/31/16   Robyn Haber, MD    Family History Family History  Problem Relation Age of Onset  . Kidney disease Mother   . Hypertension Mother   . Epilepsy Mother   . Sleep apnea Mother   . Diabetes Father   . Kidney disease Maternal Grandmother   . Diabetes Maternal Grandmother     Social History Social History  Substance Use Topics  . Smoking status: Never Smoker  . Smokeless tobacco: Never Used  . Alcohol use No     Allergies   Ultram [tramadol hcl]   Review of Systems Review of Systems  Constitutional: Positive for chills and fever.  Respiratory: Positive for cough and shortness of breath.   Cardiovascular: Positive for chest  pain.  All other systems reviewed and are negative.    Physical Exam Updated Vital Signs BP 136/74   Pulse 67   Temp 98.4 F (36.9 C) (Oral)   Resp 21   Ht _0  (1.575 m)   Wt 270 lb (122.5 kg)   SpO2 98%   BMI 49.38 kg/m   Physical Exam  Constitutional: She is oriented to person, place, and time. She appears well-developed and well-nourished. No distress.  HENT:  Head: Normocephalic and atraumatic.  Right Ear: Hearing normal.  Left Ear: Hearing normal.  Nose: Nose normal.  Mouth/Throat: Oropharynx is clear and moist and mucous membranes are normal.  Eyes: Conjunctivae and EOM are normal. Pupils are equal, round, and reactive to light.  Neck: Normal range  of motion. Neck supple.  Cardiovascular: Regular rhythm, S1 normal and S2 normal.  Exam reveals no gallop and no friction rub.   No murmur heard. Pulmonary/Chest: Effort normal and breath sounds normal. No respiratory distress. She exhibits no tenderness.  Abdominal: Soft. Normal appearance and bowel sounds are normal. There is no hepatosplenomegaly. There is no tenderness. There is no rebound, no guarding, no tenderness at McBurney's point and negative Murphy's sign. No hernia.  Musculoskeletal: Normal range of motion.  Neurological: She is alert and oriented to person, place, and time. She has normal strength. No cranial nerve deficit or sensory deficit. Coordination normal. GCS eye subscore is 4. GCS verbal subscore is 5. GCS motor subscore is 6.  Skin: Skin is warm, dry and intact. No rash noted. No cyanosis.  Psychiatric: She has a normal mood and affect. Her speech is normal and behavior is normal. Thought content normal.  Nursing note and vitals reviewed.    ED Treatments / Results  DIAGNOSTIC STUDIES: Oxygen Saturation is 98% on RA, normal by my interpretation.  COORDINATION OF CARE:  2:40 AM Discussed treatment plan with pt at bedside and pt agreed to plan.  Labs (all labs ordered are listed, but only  abnormal results are displayed) Labs Reviewed  BASIC METABOLIC PANEL - Abnormal; Notable for the following:       Result Value   Glucose, Bld 264 (*)    All other components within normal limits  CBC WITH DIFFERENTIAL/PLATELET - Abnormal; Notable for the following:    Hemoglobin 11.4 (*)    HCT 32.9 (*)    All other components within normal limits  CBC  I-STAT TROPOININ, ED    EKG  EKG Interpretation None       Radiology Dg Chest 2 View  Result Date: 09/11/2016 CLINICAL DATA:  Right-sided chest pain and cough for 1 day EXAM: CHEST  2 VIEW COMPARISON:  None. FINDINGS: Patchy right upper lobe opacity, suspicious for pneumonia. The left lung is clear. There is no pleural effusion. Hilar, mediastinal and cardiac contours are unchanged. Unchanged mild cardiomegaly IMPRESSION: Right upper lobe airspace opacity, likely pneumonia. Unchanged mild cardiomegaly. Electronically Signed   By: Andreas Newport M.D.   On: 09/11/2016 01:56    Procedures Procedures (including critical care time)  Medications Ordered in ED Medications  levofloxacin (LEVAQUIN) tablet 750 mg (not administered)  albuterol (PROVENTIL HFA;VENTOLIN HFA) 108 (90 Base) MCG/ACT inhaler 2 puff (not administered)     Initial Impression / Assessment and Plan / ED Course  I have reviewed the triage vital signs and the nursing notes.  Pertinent labs & imaging results that were available during my care of the patient were reviewed by me and considered in my medical decision making (see chart for details).  Clinical Course     Patient presents with cough that has been ongoing for more than a week. She has now developed some shortness of breath. Chest x-ray shows evidence of community-acquired pneumonia. Vital signs are normal. She does not have tachycardia or hypoxia. Blood work is normal as well. She does have a history of leukemia, and recession. No evidence of recurrence. Patient is appropriate for outpatient  management of community-acquired pneumonia.  Final Clinical Impressions(s) / ED Diagnoses   Final diagnoses:  Community acquired pneumonia of right upper lobe of lung ([place])  I personally performed the services described in this documentation, which was scribed in my presence. The recorded information has been reviewed and is accurate.   New Prescriptions  New Prescriptions   BENZONATATE (TESSALON) 200 MG CAPSULE    Take 1 capsule (200 mg total) by mouth 3 (three) times daily as needed for cough.   LEVOFLOXACIN (LEVAQUIN) 750 MG TABLET    Take 1 tablet (750 mg total) by mouth daily.     Orpah Greek, MD 09/11/16 [phone]

## 2016-11-15 IMAGING — US US RENAL
1 series · 14 of 25 positions shown · non-contrast
Comparison: None.

CLINICAL DATA: Left upper quadrant abdominal pain.

EXAM:
RENAL / URINARY TRACT ULTRASOUND COMPLETE

[Series 1: us renal · 0.25mm/px · 14 of 33 slices shown]
[im 1/33]
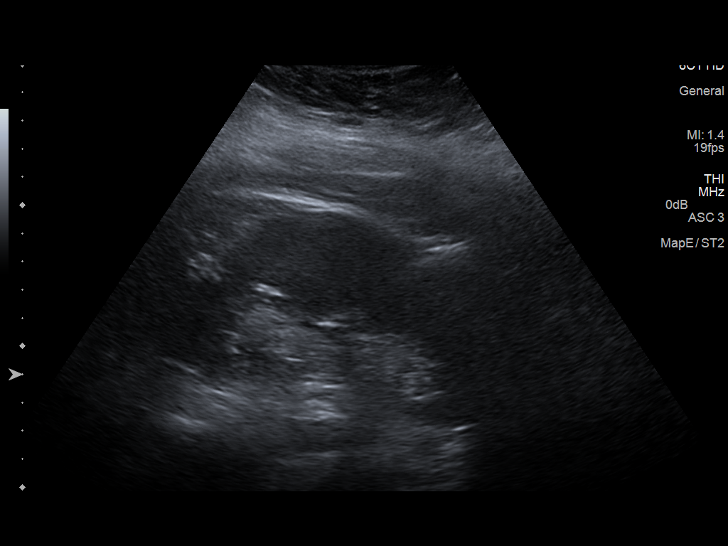
[im 3/33]
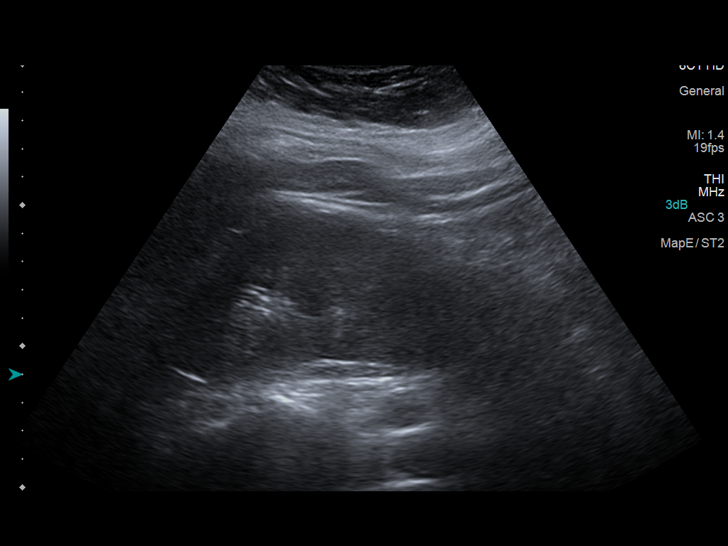
[im 6/33]
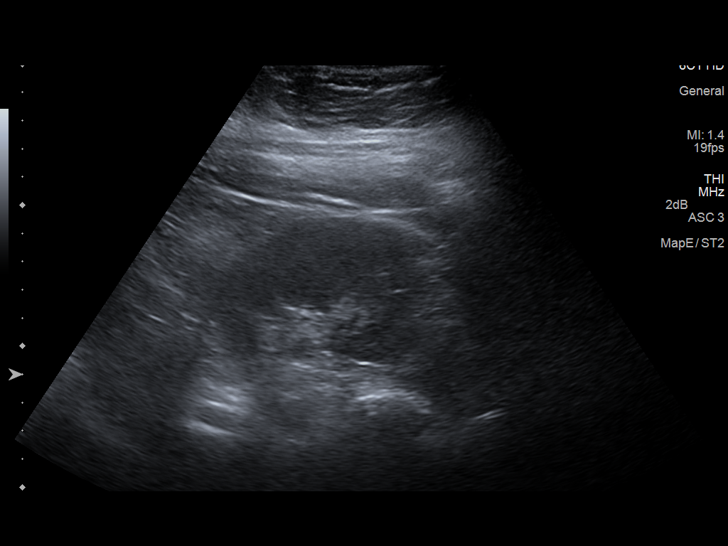
[im 9/33]
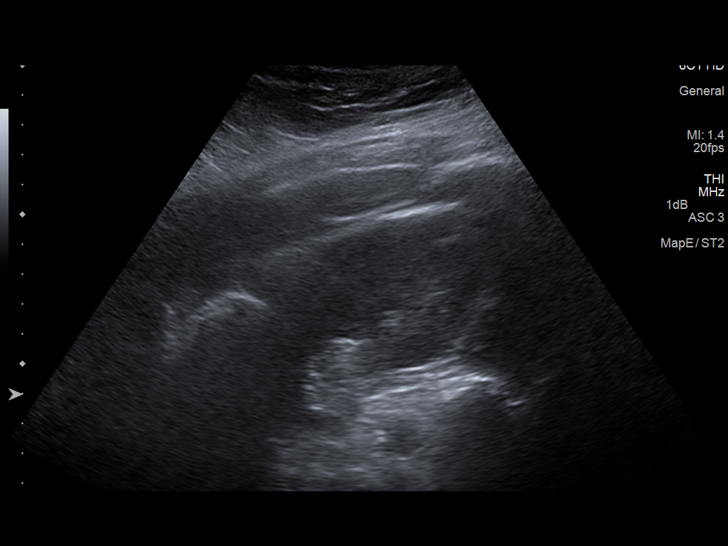
[im 11/33]
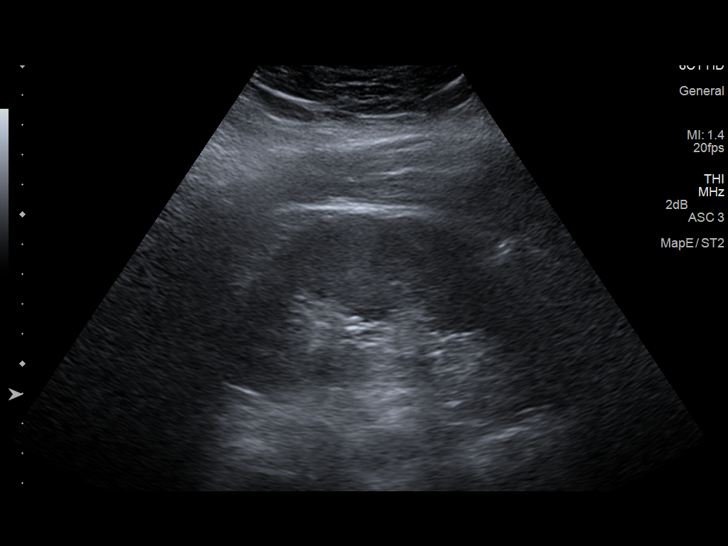
[im 13/33]
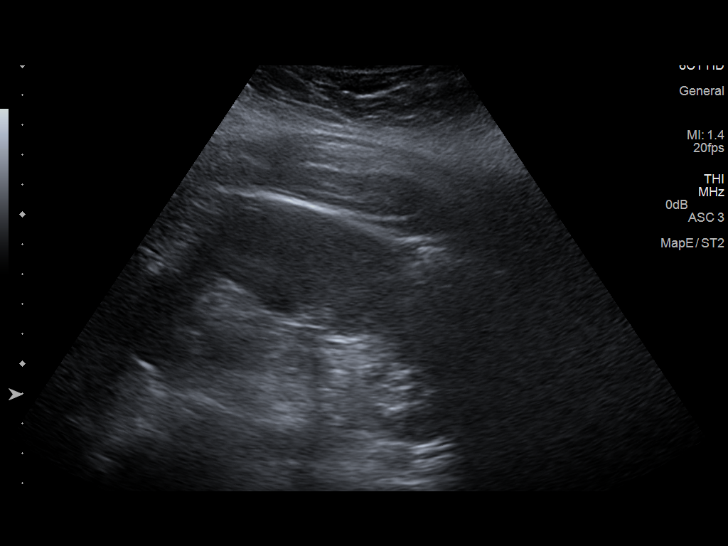
[im 15/33]
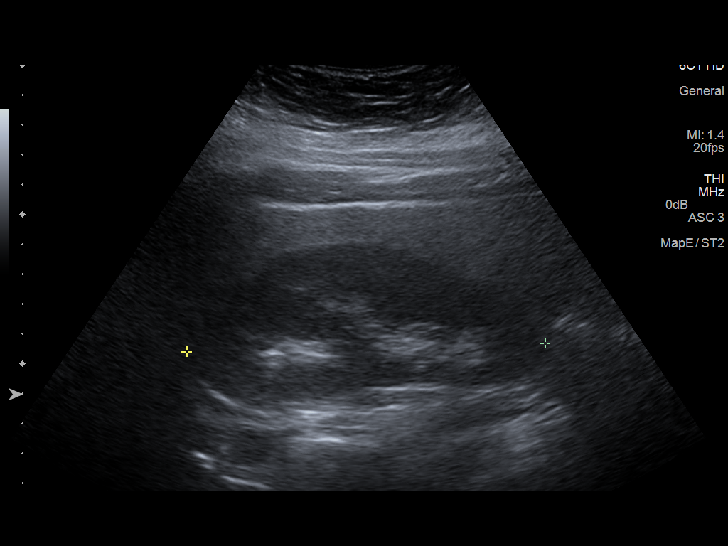
[im 18/33]
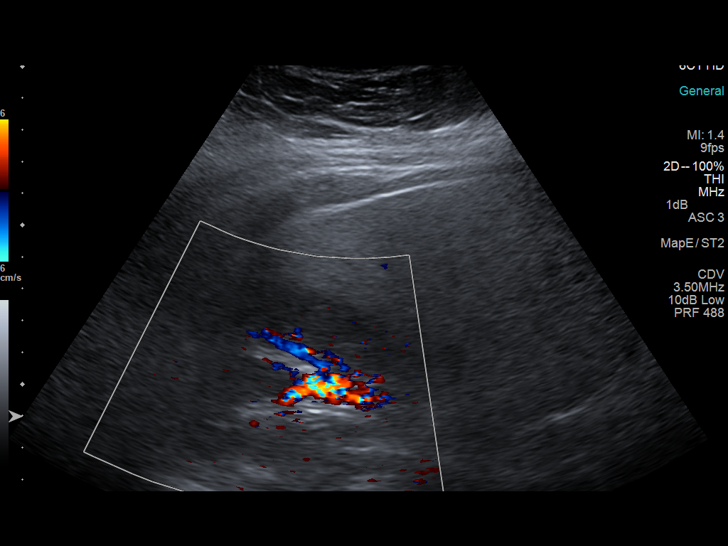
[im 21/33]
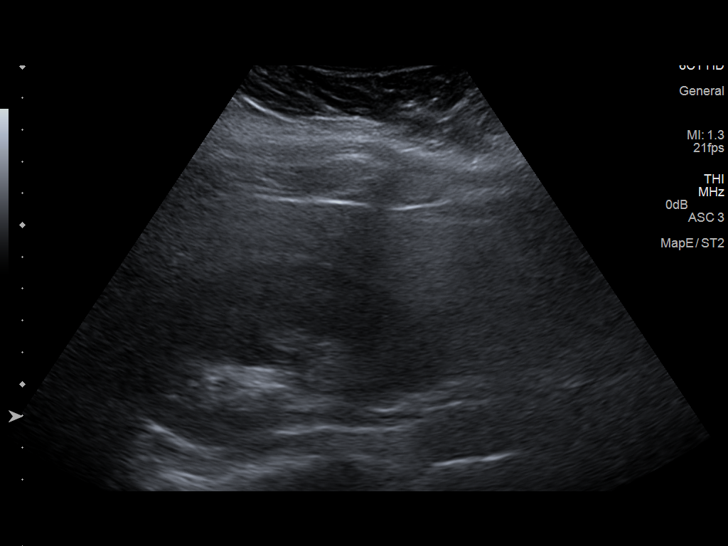
[im 22/33]
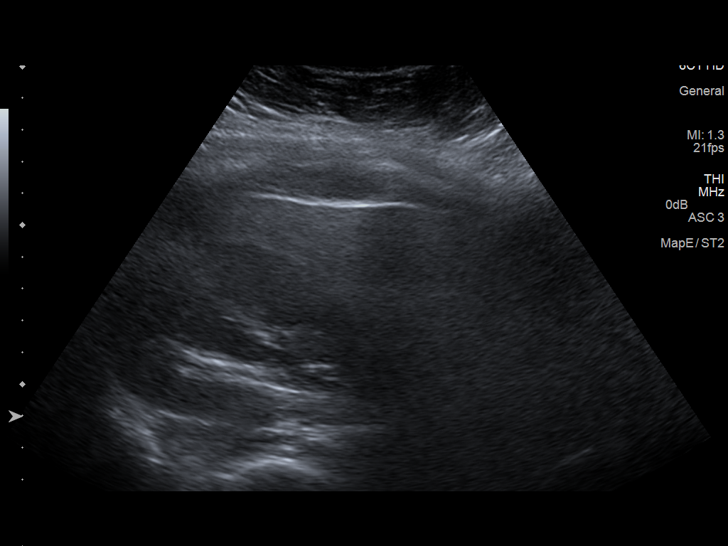
[im 25/33]
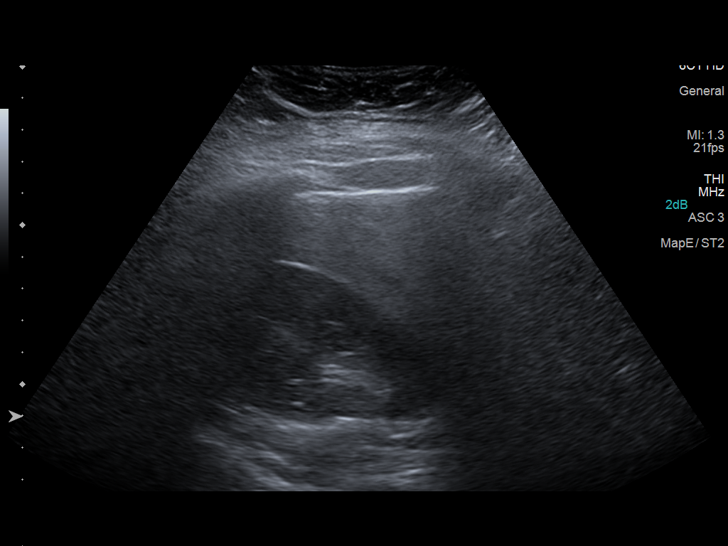
[im 27/33]
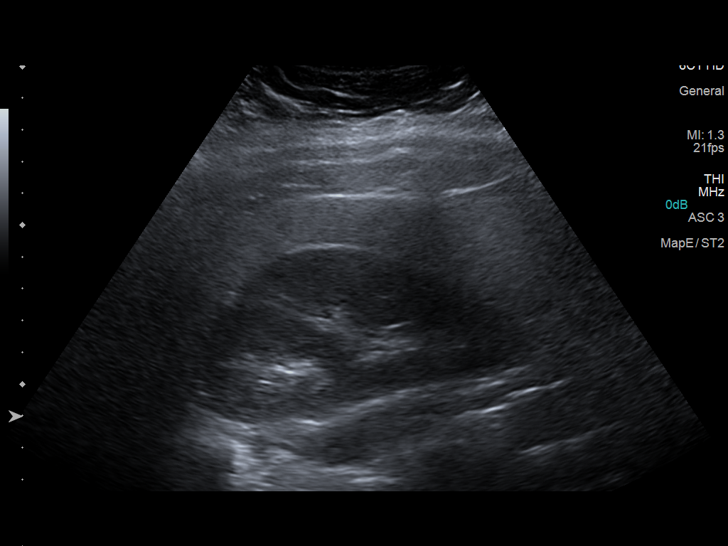
[im 30/33]
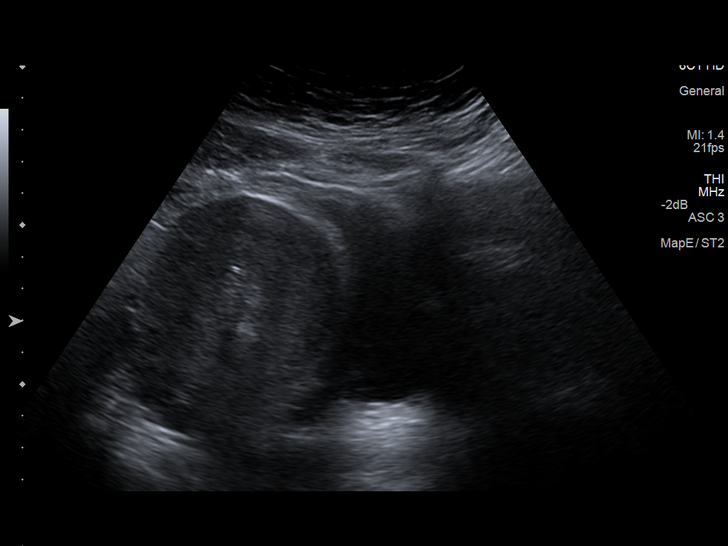
[im 33/33]
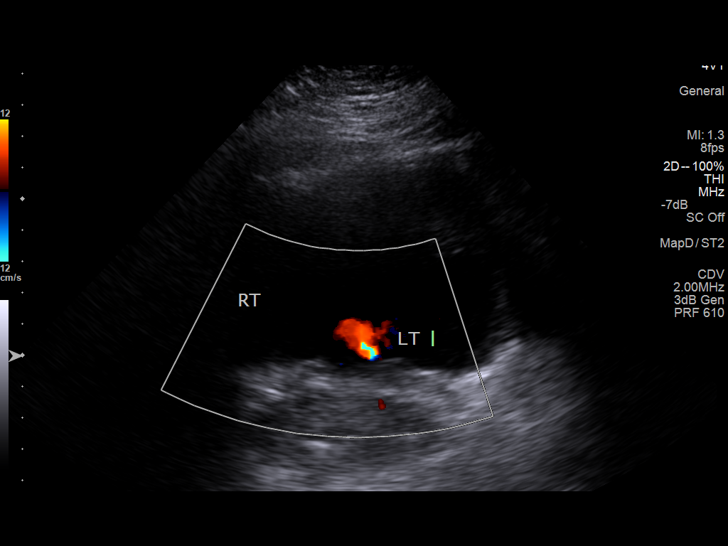

[14 of 25 positions shown; findings below may reference images not displayed]

FINDINGS: Right Kidney:

Length: 12.0 cm. Echogenicity within normal limits. No mass or
hydronephrosis visualized.

Left Kidney:

Length: 11.8 cm. Echogenicity within normal limits. No mass or
hydronephrosis visualized.

Bladder:

Appears normal for degree of bladder distention. Bilateral ureteral
jets are noted.
IMPRESSION: Normal renal ultrasound.

## 2017-01-29 ENCOUNTER — Encounter (HOSPITAL_COMMUNITY): Payer: Self-pay | Admitting: *Deleted

## 2017-01-29 ENCOUNTER — Emergency Department (HOSPITAL_COMMUNITY)
Admission: EM | Admit: 2017-01-29 | Discharge: 2017-01-29 | Disposition: A | Discharge status: Home / Self Care | Payer: Commercial Managed Care - HMO | Attending: Emergency Medicine | Admitting: Emergency Medicine

## 2017-01-29 DIAGNOSIS — I10 Essential (primary) hypertension: Secondary | ICD-10-CM | POA: Insufficient documentation

## 2017-01-29 DIAGNOSIS — N898 Other specified noninflammatory disorders of vagina: Secondary | ICD-10-CM | POA: Diagnosis not present

## 2017-01-29 DIAGNOSIS — Z794 Long term (current) use of insulin: Secondary | ICD-10-CM | POA: Insufficient documentation

## 2017-01-29 DIAGNOSIS — Z87891 Personal history of nicotine dependence: Secondary | ICD-10-CM | POA: Diagnosis not present

## 2017-01-29 DIAGNOSIS — R1013 Epigastric pain: Secondary | ICD-10-CM | POA: Insufficient documentation

## 2017-01-29 DIAGNOSIS — Z79899 Other long term (current) drug therapy: Secondary | ICD-10-CM | POA: Diagnosis not present

## 2017-01-29 DIAGNOSIS — Z856 Personal history of leukemia: Secondary | ICD-10-CM | POA: Diagnosis not present

## 2017-01-29 DIAGNOSIS — E119 Type 2 diabetes mellitus without complications: Secondary | ICD-10-CM | POA: Insufficient documentation

## 2017-01-29 LAB — COMPREHENSIVE METABOLIC PANEL
ALT: 29 U/L (ref 14–54)
AST: 29 U/L (ref 15–41)
Albumin: 4 g/dL (ref 3.5–5.0)
Alkaline Phosphatase: 58 U/L (ref 38–126)
Anion gap: 12 (ref 5–15)
BUN: 10 mg/dL (ref 6–20)
CHLORIDE: 102 mmol/L (ref 101–111)
CO2: 21 mmol/L — ABNORMAL LOW (ref 22–32)
CREATININE: 0.7 mg/dL (ref 0.44–1.00)
Calcium: 8.9 mg/dL (ref 8.9–10.3)
Glucose, Bld: 297 mg/dL — ABNORMAL HIGH (ref 65–99)
Potassium: 3.7 mmol/L (ref 3.5–5.1)
Sodium: 135 mmol/L (ref 135–145)
Total Bilirubin: 2 mg/dL — ABNORMAL HIGH (ref 0.3–1.2)
Total Protein: 7.2 g/dL (ref 6.5–8.1)

## 2017-01-29 LAB — CBC WITH DIFFERENTIAL/PLATELET
Basophils Absolute: 0 10*3/uL (ref 0.0–0.1)
Basophils Relative: 0 %
EOS PCT: 0 %
Eosinophils Absolute: 0 10*3/uL (ref 0.0–0.7)
HCT: 41.5 % (ref 36.0–46.0)
Hemoglobin: 14.3 g/dL (ref 12.0–15.0)
LYMPHS ABS: 0.7 10*3/uL (ref 0.7–4.0)
LYMPHS PCT: 13 %
MCH: 28.7 pg (ref 26.0–34.0)
MCHC: 34.5 g/dL (ref 30.0–36.0)
MCV: 83.2 fL (ref 78.0–100.0)
Monocytes Absolute: 0.2 10*3/uL (ref 0.1–1.0)
Monocytes Relative: 5 %
Neutro Abs: 4.4 10*3/uL (ref 1.7–7.7)
Neutrophils Relative %: 82 %
PLATELETS: 214 10*3/uL (ref 150–400)
RBC: 4.99 MIL/uL (ref 3.87–5.11)
RDW: 13.3 % (ref 11.5–15.5)
WBC: 5.4 10*3/uL (ref 4.0–10.5)

## 2017-01-29 LAB — I-STAT CHEM 8, ED
BUN: 11 mg/dL (ref 6–20)
CALCIUM ION: 1.07 mmol/L — AB (ref 1.15–1.40)
Chloride: 101 mmol/L (ref 101–111)
Creatinine, Ser: 0.7 mg/dL (ref 0.44–1.00)
Glucose, Bld: 295 mg/dL — ABNORMAL HIGH (ref 65–99)
HCT: 42 % (ref 36.0–46.0)
Hemoglobin: 14.3 g/dL (ref 12.0–15.0)
Potassium: 3.8 mmol/L (ref 3.5–5.1)
SODIUM: 137 mmol/L (ref 135–145)
TCO2: 23 mmol/L (ref 0–100)

## 2017-01-29 LAB — URINALYSIS, ROUTINE W REFLEX MICROSCOPIC
BILIRUBIN URINE: NEGATIVE
Glucose, UA: >=500 mg/dL — AB
Hgb urine dipstick: NEGATIVE
KETONES UR: 20 mg/dL — AB
Nitrite: NEGATIVE
PROTEIN: 30 mg/dL — AB
SPECIFIC GRAVITY, URINE: 1.016 (ref 1.005–1.030)
pH: 5 (ref 5.0–8.0)

## 2017-01-29 LAB — LIPASE, BLOOD: LIPASE: 31 U/L (ref 11–51)

## 2017-01-29 LAB — CBG MONITORING, ED: GLUCOSE-CAPILLARY: 285 mg/dL — AB (ref 65–99)

## 2017-01-29 LAB — RPR: RPR Ser Ql: NONREACTIVE

## 2017-01-29 LAB — WET PREP, GENITAL
Clue Cells Wet Prep HPF POC: NONE SEEN
Sperm: NONE SEEN
TRICH WET PREP: NONE SEEN
YEAST WET PREP: NONE SEEN

## 2017-01-29 LAB — HIV ANTIBODY (ROUTINE TESTING W REFLEX): HIV SCREEN 4TH GENERATION: NONREACTIVE

## 2017-01-29 MED ORDER — PROMETHAZINE HCL 25 MG PO TABS: 25.0000 mg | ORAL_TABLET | Freq: Four times a day (QID) | ORAL | 0 refills | Status: DC | PRN

## 2017-01-29 MED ORDER — SODIUM CHLORIDE 0.9 % IV BOLUS (SEPSIS): 1000.0000 mL | Freq: Once | INTRAVENOUS | Status: DC

## 2017-01-29 MED ORDER — FLUCONAZOLE 150 MG PO TABS: 150.0000 mg | ORAL_TABLET | Freq: Once | ORAL | 0 refills | Status: AC

## 2017-01-29 MED ORDER — HYDROMORPHONE HCL 1 MG/ML IJ SOLN
0.5000 mg | Freq: Once | INTRAMUSCULAR | Status: AC
Start: 2017-01-29 — End: 2017-01-29
  Administered 2017-01-29: 0.5 mg via INTRAVENOUS
  Filled 2017-01-29: qty 1

## 2017-01-29 MED ORDER — FLUCONAZOLE 100 MG PO TABS
150.0000 mg | ORAL_TABLET | Freq: Once | ORAL | Status: AC
  Administered 2017-01-29: 150 mg via ORAL
  Filled 2017-01-29: qty 2

## 2017-01-29 MED ORDER — GI COCKTAIL ~~LOC~~
30.0000 mL | Freq: Once | ORAL | Status: AC
  Administered 2017-01-29: 30 mL via ORAL
  Filled 2017-01-29: qty 30

## 2017-01-29 MED ORDER — ONDANSETRON HCL 4 MG/2ML IJ SOLN
4.0000 mg | Freq: Once | INTRAMUSCULAR | Status: AC
  Administered 2017-01-29: 4 mg via INTRAVENOUS
  Filled 2017-01-29: qty 2

## 2017-01-29 MED ORDER — HYDROCODONE-ACETAMINOPHEN 5-325 MG PO TABS: 1.0000 | ORAL_TABLET | ORAL | 0 refills | Status: DC | PRN

## 2017-01-29 MED ORDER — FENTANYL CITRATE (PF) 100 MCG/2ML IJ SOLN
50.0000 ug | Freq: Once | INTRAMUSCULAR | Status: AC
  Administered 2017-01-29: 50 ug via INTRAVENOUS
  Filled 2017-01-29: qty 2

## 2017-01-29 NOTE — ED Notes (Signed)
Pt's CBG 285.  Informed Claiborne Billings, RN.

## 2017-01-29 NOTE — ED Triage Notes (Signed)
c/o lower abd  Pain onset yest with nausea and vomiting, c/o vaginal discharge onset 2 weeks ago with odor.

## 2017-01-29 NOTE — Discharge Instructions (Signed)
I AM discharging you with Phenergan tablets. If you have vomiting and U cannot hold down the Phenergan tablet. You may Place 1 tablet in your rectum or vagina. It will absorb and he should be able to help control any vomiting. If you're vomiting is uncontrolled, please return to the emergency department. Please follow up closely with her primary care physician. Abdominal (belly) pain can be caused by many things. Your caregiver performed an examination and possibly ordered blood/urine tests and imaging (CT scan, x-rays, ultrasound). Many cases can be observed and treated at home after initial evaluation in the emergency department. Even though you are being discharged home, abdominal pain can be unpredictable. Therefore, you need a repeated exam if your pain does not resolve, returns, or worsens. Most patients with abdominal pain don't have to be admitted to the hospital or have surgery, but serious problems like appendicitis and gallbladder attacks can start out as nonspecific pain. Many abdominal conditions cannot be diagnosed in one visit, so follow-up evaluations are very important. SEEK IMMEDIATE MEDICAL ATTENTION IF: The pain does not go away or becomes severe.  A temperature above 101 develops.  Repeated vomiting occurs (multiple episodes).  The pain becomes localized to portions of the abdomen. The right side could possibly be appendicitis. In an adult, the left lower portion of the abdomen could be colitis or diverticulitis.  Blood is being passed in stools or vomit (bright red or black tarry stools).  Return also if you develop chest pain, difficulty breathing, dizziness or fainting, or become confused, poorly responsive, or inconsolable (young children).

## 2017-01-29 NOTE — ED Notes (Signed)
Patient was given diet ginger ale to drink

## 2017-01-29 NOTE — ED Provider Notes (Signed)
[facility] DEPT Provider Note   CSN: [id number] Arrival date & time: 01/29/17  0809     History   Chief Complaint Chief Complaint  Patient presents with  . Abdominal Pain  . Vaginal Discharge    HPI Melissa Washington is a 31 y.o. female  with a history of acute promyelocytic leukemia in remission, morbid obesity, and frequent episodes of yeast vaginitis who presents the emergency apartment with chief complaint of epigastric pain, nausea, vomiting and vaginal irritation. Patient states that yesterday she began having epigastric pain with nausea and vomiting. She's not been able to hold down medications. She denies diarrhea. She's never had anything like this before. She denies chest pain, shortness of breath. She is a previous surgical history: Cholecystectomy. The patient also complains of vaginal irritation and has been ongoing for the past 3 days. She states it feels consistent with her previous yeast vaginitis infections. She is unsure how high her blood sugars have been because she has not been taking it at home. She denies any urinary symptoms.  HPI  Past Medical History:  Diagnosis Date  . 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 Bacteremia associated with IV line ([place]) 05/01/2013   Port-a-cath infection, removed 8/6 by IR. Ecoli grew out in cultures. Sent home with Ceftriaxone to complete 14 day course.   . Bacteremia due to Escherichia coli 05/01/2013   Port-a-cath infection, removed 8/6 by IR. Ecoli grew out in cultures. Sent home with Ceftriaxone to complete 14 day course.   . Cholelithiasis 01/17/2012  . Collagen vascular disease ([place])   . Diabetes mellitus    type 2, insulin  . Headache(784.0)   . Hepatic steatosis 01/17/2012  . Hypertension   . Leukemia, acute monocytic, in remission ([place])   . Nephrolithiasis   . Obesity     Patient Active Problem List   Diagnosis Date Noted  . Vaginitis and vulvovaginitis 10/09/2014  . Alopecia 06/26/2014  .  Bilateral hip pain 06/19/2014  . Anxiety associated with depression 06/19/2014  . Allergic rhinitis 06/07/2014  . IUD complication ([place]) 74/82/7078  . Exposure to STD 06/07/2014  . Acute promyelocytic leukemia in remission ([place]) 11/19/2012  . Morbid obesity with BMI of 50.0-59.9, adult ([place]) 01/20/2012  . DM (diabetes mellitus), type 2, uncontrolled ([place]) 12/30/2006  . HYPERTENSION, BENIGN ESSENTIAL 12/30/2006    Past Surgical History:  Procedure Laterality Date  . CHOLECYSTECTOMY  01/31/2012   Procedure: LAPAROSCOPIC CHOLECYSTECTOMY WITH INTRAOPERATIVE CHOLANGIOGRAM;  Surgeon: Marcello Moores A. Cornett, MD;  Location: [facility] OR;  Service: General;  Laterality: N/A;  . PORTACATH PLACEMENT     [place]    OB History    Gravida Para Term Preterm AB Living   '2 2 2 ' 0 0 2   SAB TAB Ectopic Multiple Live Births   0 0 0 0 2       Home Medications    Prior to Admission medications   Medication Sig Start Date End Date Taking? Authorizing Provider  ACCU-CHEK FASTCLIX LANCETS MISC 1 each by Does not apply route 3 (three) times daily. Check sugar 3x daily 06/07/14   Kuneff, Renee A, DO  amoxicillin (AMOXIL) 875 MG tablet Take 1 tablet (875 mg total) by mouth 2 (two) times daily. 08/31/16   Robyn Haber, MD  benzonatate (TESSALON) 200 MG capsule Take 1 capsule (200 mg total) by mouth 3 (three) times daily as needed for cough. 09/11/16   Orpah Greek, MD  Blood Glucose Monitoring Suppl (ACCU-CHEK  NANO SMARTVIEW) W/DEVICE KIT Check blood glucose x3 daily. Must take morning fasting blood glucose 06/07/14   Kuneff, Renee A, DO  cyclobenzaprine (FLEXERIL) 10 MG tablet Take 1 tablet (10 mg total) by mouth at bedtime. 08/31/16   Robyn Haber, MD  glucose blood (ACCU-CHEK SMARTVIEW) test strip Check sugar 3 x daily 06/07/14   Kuneff, Renee A, DO  HYDROcodone-acetaminophen (NORCO/VICODIN) 5-325 MG tablet Take 1 tablet by mouth every 4 (four) hours as needed. 05/05/16   Janne Napoleon, NP  insulin detemir  (LEVEMIR) 100 UNIT/ML injection Inject 0.2 mLs (20 Units total) into the skin at bedtime. 03/29/15   Patrecia Pour, MD  Insulin Glargine (LANTUS) 100 UNIT/ML Solostar Pen Inject 20 Units into the skin daily at 10 pm. 12/29/15   Pisciotta, Elmyra Ricks, PA-C  Insulin Pen Needle 29G X 8MM MISC As directed 12/29/15   Pisciotta, Elmyra Ricks, PA-C  Insulin Syringe-Needle U-100 (INSULIN SYRINGE 1CC/31GX5/16") 31G X 5/16" 1 ML MISC Use as directed to inject insulin. 01/20/15   Presson, Audelia Hives, PA  levofloxacin (LEVAQUIN) 750 MG tablet Take 1 tablet (750 mg total) by mouth daily. 09/11/16   Orpah Greek, MD  levonorgestrel (MIRENA) 20 MCG/24HR IUD 1 each by Intrauterine route continuous.    [provider]  lisinopril (PRINIVIL,ZESTRIL) 5 MG tablet Take 1 tablet (5 mg total) by mouth daily. 06/19/14   Kuneff, Renee A, DO  meloxicam (MOBIC) 7.5 MG tablet Take 2 tablets (15 mg total) by mouth daily. 07/14/16   Larene Pickett, PA-C  metFORMIN (GLUCOPHAGE) 1000 MG tablet Take 1 tablet (1,000 mg total) by mouth 2 (two) times daily with a meal. 03/29/15   Patrecia Pour, MD  mupirocin ointment Drue Stager) 2 % Apply to skin bid 05/11/16   Billy Fischer, MD  naproxen (NAPROSYN) 500 MG tablet Take 1 tablet (500 mg total) by mouth 2 (two) times daily. 06/16/16   Etta Quill, NP  topiramate (TOPAMAX) 25 MG tablet Take 1 tablet (25 mg total) by mouth daily. 08/31/16   Robyn Haber, MD    Family History Family History  Problem Relation Age of Onset  . Kidney disease Mother   . Hypertension Mother   . Epilepsy Mother   . Sleep apnea Mother   . Diabetes Father   . Kidney disease Maternal Grandmother   . Diabetes Maternal Grandmother     Social History Social History  Substance Use Topics  . Smoking status: Former Research scientist (life sciences)  . Smokeless tobacco: Never Used  . Alcohol use No     Allergies   Ultram [tramadol hcl]   Review of Systems Review of Systems Ten systems reviewed and are negative for  acute change, except as noted in the HPI.    Physical Exam Updated Vital Signs BP 119/68   Pulse 98   Temp 99 F (37.2 C) (Oral)   Resp 16   Ht '5\' 2"'  (1.575 m)   Wt 122.5 kg   SpO2 98%   BMI 49.38 kg/m   Physical Exam  Constitutional: She is oriented to person, place, and time. She appears well-developed and well-nourished. No distress.  HENT:  Head: Normocephalic and atraumatic.  Eyes: Conjunctivae are normal. No scleral icterus.  Neck: Normal range of motion.  Cardiovascular: Normal rate, regular rhythm and normal heart sounds.  Exam reveals no gallop and no friction rub.   No murmur heard. Pulmonary/Chest: Effort normal and breath sounds normal. No respiratory distress.  Abdominal: Soft. Bowel sounds are normal. She exhibits  no distension and no mass. There is tenderness (diffuse abdominal tenderness). There is no guarding.  Genitourinary:  Genitourinary Comments: Normal external female genitalia, the vagina is irritated with Synetta Shadow like white vaginal discharge. Cervix is without discharge, no CMT or adnexal tenderness. Her IUD strings are in place. Exam is limited due to body habitus.  Neurological: She is alert and oriented to person, place, and time.  Skin: Skin is warm and dry. She is not diaphoretic.  Psychiatric: Her behavior is normal.  Nursing note and vitals reviewed.    ED Treatments / Results  Labs (all labs ordered are listed, but only abnormal results are displayed) Labs Reviewed  WET PREP, GENITAL - Abnormal; Notable for the following:       Result Value   WBC, Wet Prep HPF POC MANY (*)    All other components within normal limits  COMPREHENSIVE METABOLIC PANEL - Abnormal; Notable for the following:    CO2 21 (*)    Glucose, Bld 297 (*)    Total Bilirubin 2.0 (*)    All other components within normal limits  URINALYSIS, ROUTINE W REFLEX MICROSCOPIC - Abnormal; Notable for the following:    Glucose, UA >=500 (*)    Ketones, ur 20 (*)    Protein, ur 30  (*)    Leukocytes, UA TRACE (*)    Bacteria, UA RARE (*)    Squamous Epithelial / LPF 0-5 (*)    All other components within normal limits  CBG MONITORING, ED - Abnormal; Notable for the following:    Glucose-Capillary 285 (*)    All other components within normal limits  I-STAT CHEM 8, ED - Abnormal; Notable for the following:    Glucose, Bld 295 (*)    Calcium, Ion 1.07 (*)    All other components within normal limits  CBC WITH DIFFERENTIAL/PLATELET  LIPASE, BLOOD  RPR  HIV ANTIBODY (ROUTINE TESTING)  GC/CHLAMYDIA PROBE AMP ([place]) NOT AT [facility]    EKG  EKG Interpretation None       Radiology No results found.  Procedures Procedures (including critical care time)  Medications Ordered in ED Medications  ondansetron (ZOFRAN) injection 4 mg (4 mg Intravenous Given 01/29/17 0956)  fentaNYL (SUBLIMAZE) injection 50 mcg (50 mcg Intravenous Given 01/29/17 0956)  gi cocktail (Maalox,Lidocaine,Donnatal) (30 mLs Oral Given 01/29/17 0938)  fluconazole (DIFLUCAN) tablet 150 mg (150 mg Oral Given 01/29/17 1049)     Initial Impression / Assessment and Plan / ED Course  I have reviewed the triage vital signs and the nursing notes.  Pertinent labs & imaging results that were available during my care of the patient were reviewed by me and considered in my medical decision making (see chart for details).     Patient here with abdominal pain, nausea and vomiting. She has no active vomiting for the past several hours has been able to hold down Diflucan and fluids down, although she continues to have some epigastric abdominal pain. Patient does have elevated blood glucose without signs of anion gap acidosis or DKA. Patient's abdominal exam is benign. She'll be discharged with pain medicine and antiemetics. She is advised to follow-up with her PCP. She'll be given a repeat dose of Diflucan appears safe for discharge at this time.  Final Clinical Impressions(s) / ED Diagnoses   Final  diagnoses:  Epigastric pain    New Prescriptions New Prescriptions   No medications on file     Margarita Mail, PA-C 01/29/17 [place],  Wille Glaser, MD 01/30/17 1040

## 2017-01-30 DIAGNOSIS — Z87891 Personal history of nicotine dependence: Secondary | ICD-10-CM | POA: Diagnosis not present

## 2017-01-30 DIAGNOSIS — Z794 Long term (current) use of insulin: Secondary | ICD-10-CM | POA: Insufficient documentation

## 2017-01-30 DIAGNOSIS — Z79899 Other long term (current) drug therapy: Secondary | ICD-10-CM | POA: Insufficient documentation

## 2017-01-30 DIAGNOSIS — K2900 Acute gastritis without bleeding: Secondary | ICD-10-CM | POA: Diagnosis not present

## 2017-01-30 DIAGNOSIS — I10 Essential (primary) hypertension: Secondary | ICD-10-CM | POA: Diagnosis not present

## 2017-01-30 DIAGNOSIS — E119 Type 2 diabetes mellitus without complications: Secondary | ICD-10-CM | POA: Insufficient documentation

## 2017-01-30 DIAGNOSIS — R1013 Epigastric pain: Secondary | ICD-10-CM | POA: Diagnosis present

## 2017-01-31 ENCOUNTER — Emergency Department (HOSPITAL_COMMUNITY): Payer: Commercial Managed Care - HMO

## 2017-01-31 ENCOUNTER — Emergency Department (HOSPITAL_COMMUNITY)
Admission: EM | Admit: 2017-01-31 | Discharge: 2017-01-31 | Disposition: A | Discharge status: Home / Self Care | Payer: Commercial Managed Care - HMO | Attending: Emergency Medicine | Admitting: Emergency Medicine

## 2017-01-31 ENCOUNTER — Encounter (HOSPITAL_COMMUNITY): Payer: Self-pay | Admitting: Emergency Medicine

## 2017-01-31 DIAGNOSIS — R1013 Epigastric pain: Secondary | ICD-10-CM

## 2017-01-31 DIAGNOSIS — K2900 Acute gastritis without bleeding: Secondary | ICD-10-CM | POA: Diagnosis not present

## 2017-01-31 LAB — URINALYSIS, ROUTINE W REFLEX MICROSCOPIC
Bilirubin Urine: NEGATIVE
Glucose, UA: >=500 mg/dL — AB
Hgb urine dipstick: NEGATIVE
Ketones, ur: 5 mg/dL — AB
Nitrite: NEGATIVE
PH: 5 (ref 5.0–8.0)
Protein, ur: NEGATIVE mg/dL
Specific Gravity, Urine: 1.015 (ref 1.005–1.030)

## 2017-01-31 LAB — COMPREHENSIVE METABOLIC PANEL
ALT: 30 U/L (ref 14–54)
AST: 33 U/L (ref 15–41)
Albumin: 3.6 g/dL (ref 3.5–5.0)
Alkaline Phosphatase: 62 U/L (ref 38–126)
Anion gap: 10 (ref 5–15)
BILIRUBIN TOTAL: 0.6 mg/dL (ref 0.3–1.2)
BUN: 8 mg/dL (ref 6–20)
CO2: 23 mmol/L (ref 22–32)
CREATININE: 0.74 mg/dL (ref 0.44–1.00)
Calcium: 8.6 mg/dL — ABNORMAL LOW (ref 8.9–10.3)
Chloride: 103 mmol/L (ref 101–111)
GFR calc Af Amer: >60 mL/min (ref >60)
Glucose, Bld: 266 mg/dL — ABNORMAL HIGH (ref 65–99)
POTASSIUM: 3.5 mmol/L (ref 3.5–5.1)
Sodium: 136 mmol/L (ref 135–145)
TOTAL PROTEIN: 6.8 g/dL (ref 6.5–8.1)

## 2017-01-31 LAB — CBC
HEMATOCRIT: 39.3 % (ref 36.0–46.0)
Hemoglobin: 13.5 g/dL (ref 12.0–15.0)
MCH: 28.8 pg (ref 26.0–34.0)
MCHC: 34.4 g/dL (ref 30.0–36.0)
MCV: 84 fL (ref 78.0–100.0)
PLATELETS: 241 10*3/uL (ref 150–400)
RBC: 4.68 MIL/uL (ref 3.87–5.11)
RDW: 13.3 % (ref 11.5–15.5)
WBC: 6 10*3/uL (ref 4.0–10.5)

## 2017-01-31 LAB — LIPASE, BLOOD: Lipase: 40 U/L (ref 11–51)

## 2017-01-31 LAB — I-STAT BETA HCG BLOOD, ED (MC, WL, AP ONLY)

## 2017-01-31 MED ORDER — OMEPRAZOLE 20 MG PO CPDR: 20.0000 mg | DELAYED_RELEASE_CAPSULE | Freq: Every day | ORAL | 0 refills | Status: DC

## 2017-01-31 MED ORDER — IOPAMIDOL (ISOVUE-300) INJECTION 61%
INTRAVENOUS | Status: AC
  Administered 2017-01-31: 100 mL
  Filled 2017-01-31: qty 100

## 2017-01-31 MED ORDER — ONDANSETRON 4 MG PO TBDP: ORAL_TABLET | ORAL | 0 refills | Status: DC

## 2017-01-31 MED ORDER — SODIUM CHLORIDE 0.9 % IV BOLUS (SEPSIS)
1000.0000 mL | Freq: Once | INTRAVENOUS | Status: AC
  Administered 2017-01-31: 1000 mL via INTRAVENOUS

## 2017-01-31 MED ORDER — PANTOPRAZOLE SODIUM 40 MG IV SOLR
40.0000 mg | Freq: Once | INTRAVENOUS | Status: AC
  Administered 2017-01-31: 40 mg via INTRAVENOUS
  Filled 2017-01-31: qty 40

## 2017-01-31 MED ORDER — MORPHINE SULFATE (PF) 4 MG/ML IV SOLN
4.0000 mg | Freq: Once | INTRAVENOUS | Status: AC
  Administered 2017-01-31: 4 mg via INTRAVENOUS
  Filled 2017-01-31: qty 1

## 2017-01-31 MED ORDER — ONDANSETRON HCL 4 MG/2ML IJ SOLN
4.0000 mg | Freq: Once | INTRAMUSCULAR | Status: AC
  Administered 2017-01-31: 4 mg via INTRAVENOUS
  Filled 2017-01-31: qty 2

## 2017-01-31 NOTE — ED Provider Notes (Signed)
[facility] DEPT Provider Note   CSN: [id number] Arrival date & time: 01/30/17  2355     History   Chief Complaint Chief Complaint  Patient presents with  . Abdominal Pain    HPI Melissa Washington is a 31 y.o. female with a hx of AML (in remission since 2013), cholecystectomy (2-3 years ago), Insulin-dependent diabetes, hypertension presents to the Emergency Department complaining of gradual, persistent, progressively worsening epigastric abdominal pain onset approximately 6 days ago. Associated symptoms include intermittent nausea and vomiting.  No treatments prior to arrival. Nothing makes it better and eating makes it worse.  Pt denies fever, chills, headache, neck pain, chest pain, shortness of breath, weakness, dizziness, syncope, dysuria, hematuria. Patient uses Mirena IUD, unknown last menstrual cycle.  Patient was evaluated for similar symptoms 3 days ago. At that time her labs were reassuring. No imaging was obtained. Patient reports that since that time her symptoms, specifically her pain have worsened significantly. She reports that eating and drinking causes worsening of the pain and nausea. She reports the pain is also exacerbated by lying on her stomach. .     The history is provided by the patient and medical records. No language interpreter was used.    Past Medical History:  Diagnosis Date  . 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 Bacteremia associated with IV line ([place]) 05/01/2013   Port-a-cath infection, removed 8/6 by IR. Ecoli grew out in cultures. Sent home with Ceftriaxone to complete 14 day course.   . Bacteremia due to Escherichia coli 05/01/2013   Port-a-cath infection, removed 8/6 by IR. Ecoli grew out in cultures. Sent home with Ceftriaxone to complete 14 day course.   . Cholelithiasis 01/17/2012  . Collagen vascular disease ([place])   . Diabetes mellitus    type 2, insulin  . Headache(784.0)   . Hepatic steatosis 01/17/2012  .  Hypertension   . Leukemia, acute monocytic, in remission ([place])   . Nephrolithiasis   . Obesity     Patient Active Problem List   Diagnosis Date Noted  . Vaginitis and vulvovaginitis 10/09/2014  . Alopecia 06/26/2014  . Bilateral hip pain 06/19/2014  . Anxiety associated with depression 06/19/2014  . Allergic rhinitis 06/07/2014  . IUD complication ([place]) 95/32/0233  . Exposure to STD 06/07/2014  . 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 Morbid obesity with BMI of 50.0-59.9, adult ([place]) 01/20/2012  . DM (diabetes mellitus), type 2, uncontrolled ([place]) 12/30/2006  . HYPERTENSION, BENIGN ESSENTIAL 12/30/2006    Past Surgical History:  Procedure Laterality Date  . CHOLECYSTECTOMY  01/31/2012   Procedure: LAPAROSCOPIC CHOLECYSTECTOMY WITH INTRAOPERATIVE CHOLANGIOGRAM;  Surgeon: Marcello Moores A. Cornett, MD;  Location: [facility] OR;  Service: General;  Laterality: N/A;  . PORTACATH PLACEMENT     [place]    OB History    Gravida Para Term Preterm AB Living   _0 0 0 2   SAB TAB Ectopic Multiple Live Births   0 0 0 0 2       Home Medications    Prior to Admission medications   Medication Sig Start Date End Date Taking? Authorizing Provider  ACCU-CHEK FASTCLIX LANCETS MISC 1 each by Does not apply route 3 (three) times daily. Check sugar 3x daily Patient not taking: Reported on 01/29/2017 06/07/14   Howard Pouch A, DO  amoxicillin (AMOXIL) 875 MG tablet Take 1 tablet (875 mg total) by mouth 2 (two) times daily. Patient not taking: Reported on 01/29/2017  08/31/16   Robyn Haber, MD  benzonatate (TESSALON) 200 MG capsule Take 1 capsule (200 mg total) by mouth 3 (three) times daily as needed for cough. Patient not taking: Reported on 01/29/2017 09/11/16   Orpah Greek, MD  Blood Glucose Monitoring Suppl (ACCU-CHEK NANO SMARTVIEW) W/DEVICE KIT Check blood glucose x3 daily. Must take morning fasting blood glucose Patient not taking: Reported on 01/29/2017 06/07/14    Howard Pouch A, DO  cyclobenzaprine (FLEXERIL) 10 MG tablet Take 1 tablet (10 mg total) by mouth at bedtime. Patient not taking: Reported on 01/29/2017 08/31/16   Robyn Haber, MD  glucose blood (ACCU-CHEK SMARTVIEW) test strip Check sugar 3 x daily Patient not taking: Reported on 01/29/2017 06/07/14   Howard Pouch A, DO  HYDROcodone-acetaminophen (NORCO) 5-325 MG tablet Take 1-2 tablets by mouth every 4 (four) hours as needed. 01/29/17   Harris, Abigail, PA-C  insulin detemir (LEVEMIR) 100 UNIT/ML injection Inject 0.2 mLs (20 Units total) into the skin at bedtime. Patient not taking: Reported on 01/29/2017 03/29/15   Patrecia Pour, MD  Insulin Glargine (LANTUS) 100 UNIT/ML Solostar Pen Inject 20 Units into the skin daily at 10 pm. 12/29/15   Pisciotta, Elmyra Ricks, PA-C  Insulin Pen Needle 29G X 8MM MISC As directed Patient not taking: Reported on 01/29/2017 12/29/15   Pisciotta, Elmyra Ricks, PA-C  Insulin Syringe-Needle U-100 (INSULIN SYRINGE 1CC/31GX5/16") 31G X 5/16" 1 ML MISC Use as directed to inject insulin. 01/20/15   Presson, Audelia Hives, PA  levofloxacin (LEVAQUIN) 750 MG tablet Take 1 tablet (750 mg total) by mouth daily. Patient not taking: Reported on 01/29/2017 09/11/16   Orpah Greek, MD  levonorgestrel (MIRENA) 20 MCG/24HR IUD 1 each by Intrauterine route continuous.    [provider]  lisinopril (PRINIVIL,ZESTRIL) 5 MG tablet Take 1 tablet (5 mg total) by mouth daily. 06/19/14   Kuneff, Renee A, DO  meloxicam (MOBIC) 7.5 MG tablet Take 2 tablets (15 mg total) by mouth daily. Patient not taking: Reported on 01/29/2017 07/14/16   Larene Pickett, PA-C  metFORMIN (GLUCOPHAGE) 1000 MG tablet Take 1 tablet (1,000 mg total) by mouth 2 (two) times daily with a meal. 03/29/15   Patrecia Pour, MD  mupirocin ointment (BACTROBAN) 2 % Apply to skin bid Patient not taking: Reported on 01/29/2017 05/11/16   Billy Fischer, MD  naproxen (NAPROSYN) 500 MG tablet Take 1 tablet (500 mg total) by  mouth 2 (two) times daily. 06/16/16   Etta Quill, NP  omeprazole (PRILOSEC) 20 MG capsule Take 1 capsule (20 mg total) by mouth daily. 01/31/17   Darcell Yacoub, Jarrett Soho, PA-C  ondansetron (ZOFRAN ODT) 4 MG disintegrating tablet 110m ODT q4 hours prn nausea/vomit 01/31/17   Mildred Bollard, HJarrett Soho PA-C  promethazine (PHENERGAN) 25 MG tablet Take 1 tablet (25 mg total) by mouth every 6 (six) hours as needed for nausea or vomiting. 01/29/17   HMargarita Mail PA-C  topiramate (TOPAMAX) 25 MG tablet Take 1 tablet (25 mg total) by mouth daily. Patient not taking: Reported on 01/29/2017 08/31/16   LRobyn Haber MD    Family History Family History  Problem Relation Age of Onset  . Kidney disease Mother   . Hypertension Mother   . Epilepsy Mother   . Sleep apnea Mother   . Diabetes Father   . Kidney disease Maternal Grandmother   . Diabetes Maternal Grandmother     Social History Social History  Substance Use Topics  . Smoking status: Former SResearch scientist (life sciences) . Smokeless tobacco:  Never Used  . Alcohol use No     Allergies   Ultram [tramadol hcl]   Review of Systems Review of Systems  Constitutional: Negative for chills and fever.  Respiratory: Negative for cough and shortness of breath.   Cardiovascular: Negative for leg swelling.  Gastrointestinal: Positive for abdominal pain, nausea and vomiting.  Genitourinary: Negative for dysuria, flank pain and pelvic pain.  Musculoskeletal: Negative for back pain.  Skin: Negative for rash.  Neurological: Negative for headaches.  Hematological: Negative for adenopathy.  All other systems reviewed and are negative.    Physical Exam Updated Vital Signs BP (!) 111/92   Pulse 86   Temp 99.2 F (37.3 C) (Oral)   Resp 18   SpO2 100%   Physical Exam  Constitutional: She appears well-developed and well-nourished. No distress.  Awake, alert, nontoxic appearance  HENT:  Head: Normocephalic and atraumatic.  Mouth/Throat: Oropharynx is clear and  moist. No oropharyngeal exudate.  Eyes: Conjunctivae are normal. No scleral icterus.  Neck: Normal range of motion. Neck supple.  Cardiovascular: Normal rate, regular rhythm and intact distal pulses.   Pulmonary/Chest: Effort normal and breath sounds normal. No respiratory distress. She has no wheezes.  Equal chest expansion  Abdominal: Soft. Bowel sounds are normal. She exhibits no distension and no mass. There is tenderness in the right upper quadrant, epigastric area and left upper quadrant. There is guarding ( mild). There is no rigidity, no rebound, no CVA tenderness and no tenderness at McBurney's point.  Obese abdomen  Musculoskeletal: Normal range of motion. She exhibits no edema.  Neurological: She is alert.  Speech is clear and goal oriented Moves extremities without ataxia  Skin: Skin is warm and dry. She is not diaphoretic.  Psychiatric: She has a normal mood and affect.  Nursing note and vitals reviewed.    ED Treatments / Results  Labs (all labs ordered are listed, but only abnormal results are displayed) Labs Reviewed  COMPREHENSIVE METABOLIC PANEL - Abnormal; Notable for the following:       Result Value   Glucose, Bld 266 (*)    Calcium 8.6 (*)    All other components within normal limits  URINALYSIS, ROUTINE W REFLEX MICROSCOPIC - Abnormal; Notable for the following:    APPearance HAZY (*)    Glucose, UA >=500 (*)    Ketones, ur 5 (*)    Leukocytes, UA MODERATE (*)    Bacteria, UA RARE (*)    Squamous Epithelial / LPF 0-5 (*)    All other components within normal limits  LIPASE, BLOOD  CBC  I-STAT BETA HCG BLOOD, ED ([facility], [facility], [facility] ONLY)     Radiology Ct Abdomen Pelvis W Contrast  Result Date: 01/31/2017 CLINICAL DATA:  Epigastric abdominal pain post cholecystectomy. EXAM: CT ABDOMEN AND PELVIS WITH CONTRAST TECHNIQUE: Multidetector CT imaging of the abdomen and pelvis was performed using the standard protocol following bolus administration of intravenous  contrast. CONTRAST:  129m ISOVUE-300 IOPAMIDOL (ISOVUE-300) INJECTION 61% COMPARISON:  05/01/2015 FINDINGS: Lower chest: The lung bases are clear. Hepatobiliary: Mild diffuse fatty infiltration of the liver. No focal liver lesions. Surgical absence of the gallbladder. No bile duct dilatation. Pancreas: Unremarkable. No pancreatic ductal dilatation or surrounding inflammatory changes. Spleen: Normal in size without focal abnormality. Adrenals/Urinary Tract: Adrenal glands are unremarkable. Kidneys are normal, without renal calculi, focal lesion, or hydronephrosis. Bladder is unremarkable. Stomach/Bowel: Stomach, small bowel, and colon are not abnormally distended. No wall thickening. Appendix is not identified. Mesenteric lymph nodes are  not pathologically enlarged and are likely reactive. Vascular/Lymphatic: No significant vascular findings are present. No enlarged abdominal or pelvic lymph nodes. Reproductive: Uterus and ovaries are not enlarged. An intrauterine device is present. Other: No abdominal wall hernia or abnormality. No abdominopelvic ascites. Musculoskeletal: No acute or significant osseous findings. IMPRESSION: No acute process demonstrated in the abdomen or pelvis. No evidence of bowel obstruction or inflammation. An intrauterine device is present. Diffuse fatty infiltration of the liver. Electronically Signed   By: Lucienne Capers M.D.   On: 01/31/2017 05:37    Procedures Procedures (including critical care time)  Medications Ordered in ED Medications  sodium chloride 0.9 % bolus 1,000 mL (1,000 mLs Intravenous New Bag/Given 01/31/17 0425)  morphine 4 MG/ML injection 4 mg (4 mg Intravenous Given 01/31/17 0426)  ondansetron (ZOFRAN) injection 4 mg (4 mg Intravenous Given 01/31/17 0426)  pantoprazole (PROTONIX) injection 40 mg (40 mg Intravenous Given 01/31/17 0427)  iopamidol (ISOVUE-300) 61 % injection (100 mLs  Contrast Given 01/31/17 0507)     Initial Impression / Assessment and Plan /  ED Course  I have reviewed the triage vital signs and the nursing notes.  Pertinent labs & imaging results that were available during my care of the patient were reviewed by me and considered in my medical decision making (see chart for details).  Clinical Course as of Jan 31 600  Sun Jan 31, 2017  0601 Abd is soft and minimally tender on repeat exam.  No rigidity or guarding.    [HM]    Clinical Course User Index [HM] Halston Fairclough, Jarrett Soho, [place]    Patient with symptoms consistent with gastritis.  Likely viral in nature.  Vitals are stable, no fever or tachycardia.  Patient is nontoxic, nonseptic appearing, in no apparent distress.  Patient does not meet the SIRS or Sepsis criteria.  Pt's symptoms have been managed in the department; fluid bolus given.  No signs of dehydration, tolerating PO fluids > 6 oz.  Lungs are clear.  No peritoneal signs, no concern for appendicitis, cholecystitis, pancreatitis, ruptured viscus, UTI, kidney stone, PID, ectopic pregnancy.  Repeat abd exam is improved.  Supportive therapy indicated.  Patient counseled, expresses understanding and agrees with plan.    Final Clinical Impressions(s) / ED Diagnoses   Final diagnoses:  Epigastric pain  Acute gastritis without hemorrhage, unspecified gastritis type    New Prescriptions New Prescriptions   OMEPRAZOLE (PRILOSEC) 20 MG CAPSULE    Take 1 capsule (20 mg total) by mouth daily.   ONDANSETRON (ZOFRAN ODT) 4 MG DISINTEGRATING TABLET    62m ODT q4 hours prn nausea/vomit     MAgapito Games05/13/18 07510   CJola Schmidt MD 01/31/17 [phone]

## 2017-01-31 NOTE — ED Notes (Signed)
Patient transported to CT 

## 2017-01-31 NOTE — Discharge Instructions (Signed)
1. Medications: zofran, omeprazole, usual home medications 2. Treatment: rest, drink plenty of fluids, advance diet slowly 3. Follow Up: Please followup with your primary doctor in 2 days for discussion of your diagnoses and further evaluation after today's visit; if you do not have a primary care doctor use the resource guide provided to find one; Please return to the ER for persistent vomiting, high fevers or worsening symptoms

## 2017-01-31 NOTE — ED Triage Notes (Signed)
Pt presents to ED for upper abdominal pain since Thursday, intermittent and stabbing.  Pt c/o intermittent nausea, and some vomiting Thursday only.

## 2017-02-01 LAB — GC/CHLAMYDIA PROBE AMP (~~LOC~~) NOT AT ARMC
Chlamydia: NEGATIVE
Neisseria Gonorrhea: NEGATIVE

## 2017-03-22 ENCOUNTER — Encounter (INDEPENDENT_AMBULATORY_CARE_PROVIDER_SITE_OTHER): Payer: Self-pay | Admitting: Orthopaedic Surgery

## 2017-03-22 ENCOUNTER — Ambulatory Visit (INDEPENDENT_AMBULATORY_CARE_PROVIDER_SITE_OTHER): Payer: Medicare HMO | Admitting: Orthopaedic Surgery

## 2017-03-22 ENCOUNTER — Ambulatory Visit (INDEPENDENT_AMBULATORY_CARE_PROVIDER_SITE_OTHER): Payer: Medicare HMO

## 2017-03-22 DIAGNOSIS — M25551 Pain in right hip: Secondary | ICD-10-CM | POA: Diagnosis not present

## 2017-03-22 DIAGNOSIS — M25552 Pain in left hip: Secondary | ICD-10-CM | POA: Diagnosis not present

## 2017-03-22 MED ORDER — ETODOLAC 500 MG PO TABS: 500.0000 mg | ORAL_TABLET | Freq: Two times a day (BID) | ORAL | 3 refills | Status: DC

## 2017-03-22 NOTE — Progress Notes (Signed)
Office Visit Note   Patient: Melissa Washington           Date of Birth: [birth year]           MRN: [id number] Visit Date: 03/22/2017              Requested by: No referring provider defined for this encounter. PCP: System, Provider Not In   Assessment & Plan: Visit Diagnoses:  1. Bilateral hip pain     Plan: There is really nothing I have to offer her. I'll send in some Etodalac as an anti-inflammatory to take as needed. I'll counsel her about stretching exercises and weight loss as well as blood glucose control and she needs to really see her primary care physician for this. All questions were encouraged and answered and again there is nothing that she needs from a surgical standpoint. I would not put injections around her control blood glucose.  Follow-Up Instructions: Return if symptoms worsen or fail to improve.   Orders:  Orders Placed This Encounter  Procedures  . XR HIPS BILAT W OR W/O PELVIS 2V   Meds ordered this encounter  Medications  . etodolac (LODINE) 500 MG tablet    Sig: Take 1 tablet (500 mg total) by mouth 2 (two) times daily.    Dispense:  60 tablet    Refill:  3      Procedures: No procedures performed   Clinical Data: No additional findings.   Subjective: No chief complaint on file. The patient comes in with chief complaint of bilateral hip pain. She is a morbidly obese diabetic who said that she stands for long period time hips well hurt on both sides were hips. We saw her 2016 for similar reasons and an MRI of her left hip was normal. She still hurts on the same side of both her hips. There is no pain in the groin on either side. She is a diabetic like a said in all the records I can see show that she is not had a hemoglobin A1c in a long period time and her recent blood glucose twice in May was close to 300. She's not taking any anti-inflammatories but is requesting something to help her pain.  HPI  Review of Systems She currently denies any  headache, chest pain, shortness of breath, fever, chills, nausea, vomiting.  Objective: Vital Signs: There were no vitals taken for this visit.  Physical Exam She is alert and oriented 3 and in no acute distress Ortho Exam Examination of both hips shows some pain over trochanteric area but no pain in the groin. Both hips move nice and smoothly without any difficulty or blocks to motion at all. Specialty Comments:  No specialty comments available.  Imaging: Xr Hips Bilat W Or W/o Pelvis 2v  Result Date: 03/22/2017 An AP pelvis and lateral both hips show no significant arthritic changes at all. The hips well located with well-maintained joint space of both hips. There is no acute findings.    PMFS History: Patient Active Problem List   Diagnosis Date Noted  . Vaginitis and vulvovaginitis 10/09/2014  . Alopecia 06/26/2014  . Bilateral hip pain 06/19/2014  . Anxiety associated with depression 06/19/2014  . Allergic rhinitis 06/07/2014  . IUD complication ([place]) 40/34/7425  . Exposure to STD 06/07/2014  . 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 Morbid obesity with BMI of 50.0-59.9, adult ([place]) 01/20/2012  . DM (diabetes mellitus), type 2, uncontrolled ([place]) 12/30/2006  . HYPERTENSION,  BENIGN ESSENTIAL 12/30/2006   Past Medical History:  Diagnosis Date  . 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 Bacteremia associated with IV line ([place]) 05/01/2013   Port-a-cath infection, removed 8/6 by IR. Ecoli grew out in cultures. Sent home with Ceftriaxone to complete 14 day course.   . Bacteremia due to Escherichia coli 05/01/2013   Port-a-cath infection, removed 8/6 by IR. Ecoli grew out in cultures. Sent home with Ceftriaxone to complete 14 day course.   . Cholelithiasis 01/17/2012  . Collagen vascular disease ([place])   . Diabetes mellitus    type 2, insulin  . Headache(784.0)   . Hepatic steatosis 01/17/2012  . Hypertension   . Leukemia, acute  monocytic, in remission ([place])   . Nephrolithiasis   . Obesity     Family History  Problem Relation Age of Onset  . Kidney disease Mother   . Hypertension Mother   . Epilepsy Mother   . Sleep apnea Mother   . Diabetes Father   . Kidney disease Maternal Grandmother   . Diabetes Maternal Grandmother     Past Surgical History:  Procedure Laterality Date  . CHOLECYSTECTOMY  01/31/2012   Procedure: LAPAROSCOPIC CHOLECYSTECTOMY WITH INTRAOPERATIVE CHOLANGIOGRAM;  Surgeon: Marcello Moores A. Cornett, MD;  Location: [place] [facility];  Service: General;  Laterality: N/A;  . PORTACATH PLACEMENT     [place]   Social History   Occupational History  . Unemployed [employer]  . Personal care aide in past    Social History Main Topics  . Smoking status: Former Research scientist (life sciences)  . Smokeless tobacco: Never Used  . Alcohol use No  . Drug use: No  . Sexual activity: Yes    Partners: Male    Birth control/ protection: None

## 2017-04-02 ENCOUNTER — Telehealth (INDEPENDENT_AMBULATORY_CARE_PROVIDER_SITE_OTHER): Payer: Self-pay

## 2017-04-02 NOTE — Telephone Encounter (Signed)
The only thing then is to tell the patient to take 2 Aleve twice daily over-the-counter which is the same thing

## 2017-04-02 NOTE — Telephone Encounter (Signed)
Can you put in a different NSAID, her insurance won't cover her naproxen for some reason

## 2017-04-05 NOTE — Telephone Encounter (Signed)
Tried calling all numbers listed in chart. None are good working numbers. Unable to advise patient since we cannot reach her. Can advise should she call back.

## 2017-04-05 NOTE — Telephone Encounter (Signed)
Call pt 

## 2017-04-07 ENCOUNTER — Emergency Department (HOSPITAL_COMMUNITY): Payer: Medicare HMO

## 2017-04-07 ENCOUNTER — Encounter (HOSPITAL_COMMUNITY): Payer: Self-pay

## 2017-04-07 ENCOUNTER — Emergency Department (HOSPITAL_COMMUNITY)
Admission: EM | Admit: 2017-04-07 | Discharge: 2017-04-07 | Disposition: A | Discharge status: Home / Self Care | Payer: Medicare HMO | Source: Home / Self Care | Attending: Emergency Medicine | Admitting: Emergency Medicine

## 2017-04-07 ENCOUNTER — Encounter (HOSPITAL_COMMUNITY): Payer: Self-pay | Admitting: *Deleted

## 2017-04-07 ENCOUNTER — Inpatient Hospital Stay (HOSPITAL_COMMUNITY)
Admission: EM | Admit: 2017-04-07 | Discharge: 2017-04-11 | DRG: 872 | Disposition: A | Discharge status: Home / Self Care | Payer: Medicare HMO | Attending: Internal Medicine | Admitting: Internal Medicine

## 2017-04-07 DIAGNOSIS — Z79899 Other long term (current) drug therapy: Secondary | ICD-10-CM | POA: Insufficient documentation

## 2017-04-07 DIAGNOSIS — Z888 Allergy status to other drugs, medicaments and biological substances status: Secondary | ICD-10-CM | POA: Diagnosis not present

## 2017-04-07 DIAGNOSIS — Z794 Long term (current) use of insulin: Secondary | ICD-10-CM

## 2017-04-07 DIAGNOSIS — R110 Nausea: Secondary | ICD-10-CM

## 2017-04-07 DIAGNOSIS — Z87891 Personal history of nicotine dependence: Secondary | ICD-10-CM | POA: Insufficient documentation

## 2017-04-07 DIAGNOSIS — IMO0002 Reserved for concepts with insufficient information to code with codable children: Secondary | ICD-10-CM

## 2017-04-07 DIAGNOSIS — B9620 Unspecified Escherichia coli [E. coli] as the cause of diseases classified elsewhere: Secondary | ICD-10-CM | POA: Diagnosis present

## 2017-04-07 DIAGNOSIS — E876 Hypokalemia: Secondary | ICD-10-CM | POA: Diagnosis not present

## 2017-04-07 DIAGNOSIS — E1165 Type 2 diabetes mellitus with hyperglycemia: Secondary | ICD-10-CM | POA: Diagnosis present

## 2017-04-07 DIAGNOSIS — Z833 Family history of diabetes mellitus: Secondary | ICD-10-CM | POA: Diagnosis not present

## 2017-04-07 DIAGNOSIS — C9241 Acute promyelocytic leukemia, in remission: Secondary | ICD-10-CM | POA: Diagnosis present

## 2017-04-07 DIAGNOSIS — Z8249 Family history of ischemic heart disease and other diseases of the circulatory system: Secondary | ICD-10-CM | POA: Diagnosis not present

## 2017-04-07 DIAGNOSIS — A419 Sepsis, unspecified organism: Secondary | ICD-10-CM | POA: Diagnosis not present

## 2017-04-07 DIAGNOSIS — I10 Essential (primary) hypertension: Secondary | ICD-10-CM | POA: Diagnosis present

## 2017-04-07 DIAGNOSIS — E119 Type 2 diabetes mellitus without complications: Secondary | ICD-10-CM

## 2017-04-07 DIAGNOSIS — F418 Other specified anxiety disorders: Secondary | ICD-10-CM | POA: Diagnosis present

## 2017-04-07 DIAGNOSIS — Z7984 Long term (current) use of oral hypoglycemic drugs: Secondary | ICD-10-CM

## 2017-04-07 DIAGNOSIS — N10 Acute tubulo-interstitial nephritis: Secondary | ICD-10-CM | POA: Diagnosis not present

## 2017-04-07 DIAGNOSIS — R109 Unspecified abdominal pain: Secondary | ICD-10-CM

## 2017-04-07 DIAGNOSIS — R1032 Left lower quadrant pain: Secondary | ICD-10-CM | POA: Insufficient documentation

## 2017-04-07 DIAGNOSIS — D729 Disorder of white blood cells, unspecified: Secondary | ICD-10-CM

## 2017-04-07 DIAGNOSIS — R072 Precordial pain: Secondary | ICD-10-CM | POA: Diagnosis present

## 2017-04-07 DIAGNOSIS — N12 Tubulo-interstitial nephritis, not specified as acute or chronic: Secondary | ICD-10-CM

## 2017-04-07 DIAGNOSIS — R1030 Lower abdominal pain, unspecified: Secondary | ICD-10-CM

## 2017-04-07 LAB — COMPREHENSIVE METABOLIC PANEL
ALBUMIN: 4.2 g/dL (ref 3.5–5.0)
ALT: 25 U/L (ref 14–54)
ALT: 30 U/L (ref 14–54)
AST: 20 U/L (ref 15–41)
AST: 35 U/L (ref 15–41)
Albumin: 4.2 g/dL (ref 3.5–5.0)
Alkaline Phosphatase: 68 U/L (ref 38–126)
Alkaline Phosphatase: 70 U/L (ref 38–126)
Anion gap: 12 (ref 5–15)
Anion gap: 12 (ref 5–15)
BUN: 10 mg/dL (ref 6–20)
BUN: 16 mg/dL (ref 6–20)
CO2: 22 mmol/L (ref 22–32)
CO2: 25 mmol/L (ref 22–32)
Calcium: 9.5 mg/dL (ref 8.9–10.3)
Calcium: 9.2 mg/dL (ref 8.9–10.3)
Chloride: 100 mmol/L — ABNORMAL LOW (ref 101–111)
Chloride: 101 mmol/L (ref 101–111)
Creatinine, Ser: 0.63 mg/dL (ref 0.44–1.00)
Creatinine, Ser: 0.88 mg/dL (ref 0.44–1.00)
GFR calc Af Amer: >60 mL/min (ref >60)
GFR calc Af Amer: >60 mL/min (ref >60)
GFR calc non Af Amer: >60 mL/min (ref >60)
GFR calc non Af Amer: >60 mL/min (ref >60)
GLUCOSE: 230 mg/dL — AB (ref 65–99)
Glucose, Bld: 293 mg/dL — ABNORMAL HIGH (ref 65–99)
POTASSIUM: 3.8 mmol/L (ref 3.5–5.1)
Potassium: 3.9 mmol/L (ref 3.5–5.1)
SODIUM: 138 mmol/L (ref 135–145)
Sodium: 134 mmol/L — ABNORMAL LOW (ref 135–145)
Total Bilirubin: 1.3 mg/dL — ABNORMAL HIGH (ref 0.3–1.2)
Total Bilirubin: 1.2 mg/dL (ref 0.3–1.2)
Total Protein: 7.6 g/dL (ref 6.5–8.1)
Total Protein: 8 g/dL (ref 6.5–8.1)

## 2017-04-07 LAB — URINALYSIS, ROUTINE W REFLEX MICROSCOPIC
Bilirubin Urine: NEGATIVE
Glucose, UA: >=500 mg/dL — AB
Ketones, ur: 5 mg/dL — AB
Nitrite: NEGATIVE
Protein, ur: 30 mg/dL — AB
Specific Gravity, Urine: 1.012 (ref 1.005–1.030)
pH: 5 (ref 5.0–8.0)

## 2017-04-07 LAB — CBC
HCT: 38 % (ref 36.0–46.0)
Hemoglobin: 13.1 g/dL (ref 12.0–15.0)
MCH: 28.5 pg (ref 26.0–34.0)
MCHC: 34.5 g/dL (ref 30.0–36.0)
MCV: 82.6 fL (ref 78.0–100.0)
Platelets: 231 10*3/uL (ref 150–400)
RBC: 4.6 MIL/uL (ref 3.87–5.11)
RDW: 13.3 % (ref 11.5–15.5)
WBC: 9.2 10*3/uL (ref 4.0–10.5)

## 2017-04-07 LAB — CBC WITH DIFFERENTIAL/PLATELET
BASOS PCT: 0 %
Basophils Absolute: 0 10*3/uL (ref 0.0–0.1)
EOS ABS: 0 10*3/uL (ref 0.0–0.7)
Eosinophils Relative: 0 %
HEMATOCRIT: 35.2 % — AB (ref 36.0–46.0)
HEMOGLOBIN: 12.2 g/dL (ref 12.0–15.0)
Lymphocytes Relative: 11 %
Lymphs Abs: 1.3 10*3/uL (ref 0.7–4.0)
MCH: 28.7 pg (ref 26.0–34.0)
MCHC: 34.7 g/dL (ref 30.0–36.0)
MCV: 82.8 fL (ref 78.0–100.0)
Monocytes Absolute: 0.8 10*3/uL (ref 0.1–1.0)
Monocytes Relative: 7 %
NEUTROS ABS: 9.8 10*3/uL — AB (ref 1.7–7.7)
NEUTROS PCT: 82 %
Platelets: 226 10*3/uL (ref 150–400)
RBC: 4.25 MIL/uL (ref 3.87–5.11)
RDW: 13.1 % (ref 11.5–15.5)
WBC: 11.9 10*3/uL — AB (ref 4.0–10.5)

## 2017-04-07 LAB — I-STAT CG4 LACTIC ACID, ED: LACTIC ACID, VENOUS: 2.53 mmol/L — AB (ref 0.5–1.9)

## 2017-04-07 LAB — I-STAT TROPONIN, ED: TROPONIN I, POC: 0 ng/mL (ref 0.00–0.08)

## 2017-04-07 LAB — LIPASE, BLOOD: Lipase: 41 U/L (ref 11–51)

## 2017-04-07 LAB — CBG MONITORING, ED: GLUCOSE-CAPILLARY: 279 mg/dL — AB (ref 65–99)

## 2017-04-07 LAB — I-STAT BETA HCG BLOOD, ED (MC, WL, AP ONLY): I-stat hCG, quantitative: <5 m[IU]/mL (ref <5)

## 2017-04-07 MED ORDER — SODIUM CHLORIDE 0.9 % IV BOLUS (SEPSIS)
1000.0000 mL | Freq: Once | INTRAVENOUS | Status: AC
  Administered 2017-04-07 (×2): 1000 mL via INTRAVENOUS

## 2017-04-07 MED ORDER — DEXTROSE 5 % IV SOLN
1.0000 g | Freq: Once | INTRAVENOUS | Status: AC
  Administered 2017-04-07: 1 g via INTRAVENOUS
  Filled 2017-04-07: qty 10

## 2017-04-07 MED ORDER — ONDANSETRON 4 MG PO TBDP
4.0000 mg | ORAL_TABLET | Freq: Once | ORAL | Status: AC
  Administered 2017-04-07: 4 mg via ORAL
  Filled 2017-04-07: qty 1

## 2017-04-07 MED ORDER — DEXTROSE 5 % IV SOLN
2.0000 g | INTRAVENOUS | Status: DC
  Administered 2017-04-08: 2 g via INTRAVENOUS
  Filled 2017-04-07 (×2): qty 2

## 2017-04-07 MED ORDER — AMOXICILLIN-POT CLAVULANATE 875-125 MG PO TABS: 1.0000 | ORAL_TABLET | Freq: Two times a day (BID) | ORAL | 0 refills | Status: DC

## 2017-04-07 MED ORDER — SODIUM CHLORIDE 0.9 % IV BOLUS (SEPSIS)
1000.0000 mL | Freq: Once | INTRAVENOUS | Status: AC
  Administered 2017-04-07: 1000 mL via INTRAVENOUS

## 2017-04-07 MED ORDER — DEXTROSE 5 % IV SOLN: 2.0000 g | Freq: Once | INTRAVENOUS | Status: DC

## 2017-04-07 MED ORDER — ONDANSETRON 4 MG PO TBDP: 4.0000 mg | ORAL_TABLET | Freq: Three times a day (TID) | ORAL | 0 refills | Status: DC | PRN

## 2017-04-07 MED ORDER — KETOROLAC TROMETHAMINE 30 MG/ML IJ SOLN
30.0000 mg | Freq: Once | INTRAMUSCULAR | Status: AC
  Administered 2017-04-08: 30 mg via INTRAVENOUS
  Filled 2017-04-07: qty 1

## 2017-04-07 MED ORDER — ACETAMINOPHEN 325 MG PO TABS
650.0000 mg | ORAL_TABLET | Freq: Once | ORAL | Status: AC
  Administered 2017-04-07: 650 mg via ORAL
  Filled 2017-04-07: qty 2

## 2017-04-07 MED ORDER — MORPHINE SULFATE (PF) 4 MG/ML IV SOLN
4.0000 mg | Freq: Once | INTRAVENOUS | Status: AC
  Administered 2017-04-08: 4 mg via INTRAVENOUS
  Filled 2017-04-07: qty 1

## 2017-04-07 MED ORDER — GI COCKTAIL ~~LOC~~
30.0000 mL | Freq: Once | ORAL | Status: AC
  Administered 2017-04-07: 30 mL via ORAL
  Filled 2017-04-07: qty 30

## 2017-04-07 MED ORDER — ONDANSETRON HCL 4 MG/2ML IJ SOLN
4.0000 mg | Freq: Once | INTRAMUSCULAR | Status: AC
  Administered 2017-04-08: 4 mg via INTRAVENOUS
  Filled 2017-04-07: qty 2

## 2017-04-07 MED ORDER — LISINOPRIL 5 MG PO TABS
5.0000 mg | ORAL_TABLET | Freq: Once | ORAL | Status: AC
  Administered 2017-04-07: 5 mg via ORAL
  Filled 2017-04-07: qty 1

## 2017-04-07 MED ORDER — MORPHINE SULFATE (PF) 4 MG/ML IV SOLN
2.0000 mg | Freq: Once | INTRAVENOUS | Status: AC
  Administered 2017-04-07: 2 mg via INTRAVENOUS
  Filled 2017-04-07: qty 1

## 2017-04-07 MED ORDER — MORPHINE SULFATE (PF) 4 MG/ML IV SOLN
4.0000 mg | Freq: Once | INTRAVENOUS | Status: AC
  Administered 2017-04-07: 4 mg via INTRAVENOUS
  Filled 2017-04-07: qty 1

## 2017-04-07 MED ORDER — ONDANSETRON HCL 4 MG/2ML IJ SOLN
4.0000 mg | Freq: Once | INTRAMUSCULAR | Status: DC
Start: 2017-04-07 — End: 2017-04-07

## 2017-04-07 MED ORDER — KETOROLAC TROMETHAMINE 30 MG/ML IJ SOLN
30.0000 mg | Freq: Once | INTRAMUSCULAR | Status: AC
  Administered 2017-04-07: 30 mg via INTRAVENOUS
  Filled 2017-04-07: qty 1

## 2017-04-07 MED ORDER — ONDANSETRON HCL 4 MG/2ML IJ SOLN
4.0000 mg | Freq: Once | INTRAMUSCULAR | Status: AC
  Administered 2017-04-07: 4 mg via INTRAVENOUS
  Filled 2017-04-07: qty 2

## 2017-04-07 NOTE — ED Triage Notes (Signed)
Patient reports fever and was seen at [facility] for the same today. Patient states they told her she had a kidney infection. Patient was given an Rx for Amoxicillin

## 2017-04-07 NOTE — Progress Notes (Addendum)
CSW spoke with pt RN and was notified that pt is in need of help with medication. CSW informed CSW that CSW would pass the message to Case Management to see about getting pt assistance if pt is eligible. CSW notified CM. CSW spoke with pt at bedside to discuss what problems pt is currently facing regarding medication, and pt states that she is unsure as to which pharmacy to go to at times for her medication as well as pt is unsure as to which pharmacy pt's doctor has sent prescriptions to in order to be filled.     Melissa Washington Melissa Washington, MSW, [place] [facility] Clinical Social Worker [phone]

## 2017-04-07 NOTE — ED Notes (Signed)
ED Provider at bedside. 

## 2017-04-07 NOTE — ED Notes (Signed)
Notified EDP,Wentz,MD., pt. I-stat CG4 Lactic acid results 2.53 and RN,Sara made aware.

## 2017-04-07 NOTE — ED Provider Notes (Signed)
[place] DEPT Provider Note   CSN: [id number] Arrival date & time: 04/07/17  1826     History   Chief Complaint Chief Complaint  Patient presents with  . Fever  . Cancer patient    HPI Melissa Washington is a 31 y.o. female with a PMHx of AML in remission x5 yrs, DM2, HTN, nephrolithiasis, and obesity, with PSHx of cholecystectomy, who presents to the ED with complaints of ongoing fevers, left flank pain, nausea, urinary frequency, malodorous urine, and chills that began yesterday, and have continued to worsen since she was discharged from the [facility] ER several hours ago.. Chart review reveals she was seen at [facility] ER earlier today, had labs that showed CMP with mild hyperglycemia, CBC with no leukocytosis, lipase WNL, U/A with +leuks and TNTC WBCs with few bacteria, neg BetaHCG, and CT renal study without acute findings. She was discharged after zofran, morphine, toradol, and rocephin doses (1g), given rx for augmentin for presumed pyelonephritis. She returns with fever 103.1 orally on initial evaluation, and states her symptoms are not controlled at home with ibuprofen. Of note, at earlier visit she did not have BCx drawn, however UCx was sent and is in process. She states that she took ibuprofen around 3:30 PM, can't recall what the dosage was, and isn't 100% sure that it wasn't Tylenol but she is almost certain that it was ibuprofen and not tylenol. She reports that her fever continues to rise, 101.9 at home orally, which was higher than it was yesterday. She continues to have 10/10 constant sharp left flank pain radiating to her lower abdomen and suprapubic area, worse with palpation, and with no relief after ibuprofen. She also continues to have nausea, urinary frequency, malodorous urine, and chills. She also states that while she was waiting for initial evaluation, she started having central chest pain that she states is sharp and intermittent, nonradiating. She mentions that she has not  had her blood pressure or diabetes medicines in about 3-6 months. She was previously on lisinopril 5 mg but hasn't taken any in 3-6 months. Her PCP is Dr. Alvy Bimler at [facility] . She is nonsmoker, denies any recent travel, sick contacts, suspicious food intake, alcohol use, or frequent NSAID use.  She denies SOB, vomiting, diarrhea, constipation, obstipation, melena, hematochezia, hematuria, dysuria, vaginal bleeding/discharge, myalgias, arthralgias, numbness, tingling, focal weakness, or any other complaints at this time.    The history is provided by the patient and medical records. No language interpreter was used.  Fever   This is a new problem. The current episode started yesterday. The problem occurs daily. The problem has been gradually worsening. The maximum temperature noted was 101 to 101.9 F. The temperature was taken using an oral thermometer. Associated symptoms include chest pain (intermittent, just started as she laid in bed in ER). Pertinent negatives include no diarrhea and no vomiting. She has tried ibuprofen for the symptoms. The treatment provided no relief.    Past Medical History:  Diagnosis Date  . 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 Bacteremia associated with IV line ([place]) 05/01/2013   Port-a-cath infection, removed 8/6 by IR. Ecoli grew out in cultures. Sent home with Ceftriaxone to complete 14 day course.   . Bacteremia due to Escherichia coli 05/01/2013   Port-a-cath infection, removed 8/6 by IR. Ecoli grew out in cultures. Sent home with Ceftriaxone to complete 14 day course.   . Cholelithiasis 01/17/2012  . Collagen vascular disease ([place])   . Diabetes  mellitus    type 2, insulin  . Headache(784.0)   . Hepatic steatosis 01/17/2012  . Hypertension   . Leukemia, acute monocytic, in remission ([place])   . Nephrolithiasis   . Obesity     Patient Active Problem List   Diagnosis Date Noted  . Vaginitis and vulvovaginitis 10/09/2014  .  Alopecia 06/26/2014  . Bilateral hip pain 06/19/2014  . Anxiety associated with depression 06/19/2014  . Allergic rhinitis 06/07/2014  . IUD complication ([place]) 62/11/5595  . Exposure to STD 06/07/2014  . Acute promyelocytic leukemia in remission ([place]) 11/19/2012  . Morbid obesity with BMI of 50.0-59.9, adult ([place]) 01/20/2012  . DM (diabetes mellitus), type 2, uncontrolled ([place]) 12/30/2006  . HYPERTENSION, BENIGN ESSENTIAL 12/30/2006    Past Surgical History:  Procedure Laterality Date  . CHOLECYSTECTOMY  01/31/2012   Procedure: LAPAROSCOPIC CHOLECYSTECTOMY WITH INTRAOPERATIVE CHOLANGIOGRAM;  Surgeon: Marcello Moores A. Cornett, MD;  Location: [facility] OR;  Service: General;  Laterality: N/A;  . PORTACATH PLACEMENT     [place]    OB History    Gravida Para Term Preterm AB Living   '2 2 2 ' 0 0 2   SAB TAB Ectopic Multiple Live Births   0 0 0 0 2       Home Medications    Prior to Admission medications   Medication Sig Start Date End Date Taking? Authorizing Provider  levonorgestrel (MIRENA) 20 MCG/24HR IUD 1 each by Intrauterine route continuous.   Yes [provider]  ACCU-CHEK FASTCLIX LANCETS MISC 1 each by Does not apply route 3 (three) times daily. Check sugar 3x daily 06/07/14   Kuneff, Renee A, DO  amoxicillin (AMOXIL) 875 MG tablet Take 1 tablet (875 mg total) by mouth 2 (two) times daily. Patient not taking: Reported on 01/29/2017 08/31/16   Robyn Haber, MD  amoxicillin-clavulanate (AUGMENTIN) 875-125 MG tablet Take 1 tablet by mouth 2 (two) times daily. 04/07/17 04/17/17  Rodell Perna A, PA-C  Blood Glucose Monitoring Suppl (ACCU-CHEK NANO SMARTVIEW) W/DEVICE KIT Check blood glucose x3 daily. Must take morning fasting blood glucose 06/07/14   Kuneff, Renee A, DO  etodolac (LODINE) 500 MG tablet Take 1 tablet (500 mg total) by mouth 2 (two) times daily. Patient not taking: Reported on 04/07/2017 03/22/17   Mcarthur Rossetti, MD  glucose blood (ACCU-CHEK SMARTVIEW) test strip  Check sugar 3 x daily 06/07/14   Kuneff, Renee A, DO  HYDROcodone-acetaminophen (NORCO) 5-325 MG tablet Take 1-2 tablets by mouth every 4 (four) hours as needed. Patient not taking: Reported on 04/07/2017 01/29/17   Margarita Mail, PA-C  insulin detemir (LEVEMIR) 100 UNIT/ML injection Inject 0.2 mLs (20 Units total) into the skin at bedtime. Patient not taking: Reported on 01/29/2017 03/29/15   Patrecia Pour, MD  Insulin Glargine (LANTUS) 100 UNIT/ML Solostar Pen Inject 20 Units into the skin daily at 10 pm. Patient not taking: Reported on 04/07/2017 12/29/15   Pisciotta, Elmyra Ricks, PA-C  Insulin Pen Needle 29G X 8MM MISC As directed 12/29/15   Pisciotta, Elmyra Ricks, PA-C  Insulin Syringe-Needle U-100 (INSULIN SYRINGE 1CC/31GX5/16") 31G X 5/16" 1 ML MISC Use as directed to inject insulin. 01/20/15   Presson, Audelia Hives, PA  lisinopril (PRINIVIL,ZESTRIL) 5 MG tablet Take 1 tablet (5 mg total) by mouth daily. Patient not taking: Reported on 04/07/2017 06/19/14   Howard Pouch A, DO  meloxicam (MOBIC) 7.5 MG tablet Take 2 tablets (15 mg total) by mouth daily. Patient not taking: Reported on 01/29/2017 07/14/16   Quincy Carnes  M, PA-C  metFORMIN (GLUCOPHAGE) 1000 MG tablet Take 1 tablet (1,000 mg total) by mouth 2 (two) times daily with a meal. Patient not taking: Reported on 04/07/2017 03/29/15   Patrecia Pour, MD  mupirocin ointment Drue Stager) 2 % Apply to skin bid Patient not taking: Reported on 01/29/2017 05/11/16   Billy Fischer, MD  omeprazole (PRILOSEC) 20 MG capsule Take 1 capsule (20 mg total) by mouth daily. Patient not taking: Reported on 04/07/2017 01/31/17   Muthersbaugh, Jarrett Soho, PA-C  ondansetron (ZOFRAN ODT) 4 MG disintegrating tablet Take 1 tablet (4 mg total) by mouth every 8 (eight) hours as needed for nausea or vomiting. 04/07/17 04/11/17  Rodell Perna A, PA-C  promethazine (PHENERGAN) 25 MG tablet Take 1 tablet (25 mg total) by mouth every 6 (six) hours as needed for nausea or vomiting. Patient not  taking: Reported on 04/07/2017 01/29/17   Margarita Mail, PA-C  topiramate (TOPAMAX) 25 MG tablet Take 1 tablet (25 mg total) by mouth daily. Patient not taking: Reported on 01/29/2017 08/31/16   Robyn Haber, MD    Family History Family History  Problem Relation Age of Onset  . Kidney disease Mother   . Hypertension Mother   . Epilepsy Mother   . Sleep apnea Mother   . Diabetes Father   . Kidney disease Maternal Grandmother   . Diabetes Maternal Grandmother     Social History Social History  Substance Use Topics  . Smoking status: Former Research scientist (life sciences)  . Smokeless tobacco: Never Used  . Alcohol use No     Allergies   Ultram [tramadol hcl]   Review of Systems Review of Systems  Constitutional: Positive for chills and fever.  Respiratory: Negative for shortness of breath.   Cardiovascular: Positive for chest pain (intermittent, just started as she laid in bed in ER).  Gastrointestinal: Positive for abdominal pain and nausea. Negative for blood in stool, constipation, diarrhea and vomiting.  Genitourinary: Positive for flank pain and frequency. Negative for dysuria, hematuria, vaginal bleeding and vaginal discharge.       +malodorous urine  Musculoskeletal: Negative for arthralgias and myalgias.  Skin: Negative for color change.  Allergic/Immunologic: Positive for immunocompromised state (DM2).  Neurological: Negative for weakness and numbness.  Psychiatric/Behavioral: Negative for confusion.   All other systems reviewed and are negative for acute change except as noted in the HPI.    Physical Exam Updated Vital Signs BP (!) 164/112   Pulse (!) 106   Temp (!) 102.9 F (39.4 C) (Oral)   Resp (!) 26   Ht '5\' 2"'  (1.575 m)   Wt 117.9 kg (260 lb)   SpO2 100%   BMI 47.55 kg/m   Physical Exam  Constitutional: She is oriented to person, place, and time. She appears well-developed and well-nourished.  Non-toxic appearance. No distress.  Febrile 103.1 orally on my exam;  however nontoxic, NAD; morbidly obese; HTN noted similar to prior visits  HENT:  Head: Normocephalic and atraumatic.  Mouth/Throat: Oropharynx is clear and moist and mucous membranes are normal.  Eyes: Conjunctivae and EOM are normal. Right eye exhibits no discharge. Left eye exhibits no discharge.  Neck: Normal range of motion. Neck supple.  Cardiovascular: Regular rhythm, normal heart sounds and intact distal pulses.  Tachycardia present.  Exam reveals no gallop and no friction rub.   No murmur heard. Mildly tachycardic in the low 100s, nl s1/s2, no m/r/g, distal pulses intact, no pedal edema   Pulmonary/Chest: Effort normal and breath sounds normal. No respiratory  distress. She has no decreased breath sounds. She has no wheezes. She has no rhonchi. She has no rales. She exhibits tenderness. She exhibits no crepitus, no deformity and no retraction.    CTAB in all lung fields, no w/r/r, no hypoxia or increased WOB, speaking in full sentences, SpO2 100% on RA Chest wall with mild TTP anteriorly along sternum and into epigastric region, without crepitus, deformities, or retractions   Abdominal: Soft. Normal appearance and bowel sounds are normal. She exhibits no distension. There is generalized tenderness. There is CVA tenderness. There is no rigidity, no rebound, no guarding, no tenderness at McBurney's point and negative Murphy's sign.  Soft, morbid obesity slightly limits exam but no obvious distension noted, +BS throughout, with mild diffuse abd TTP most focally in the lower abdomen/suprapubic region but somewhat generalized throughout, no r/g/r, neg murphy's, neg mcburney's, +L CVA TTP   Musculoskeletal: Normal range of motion.  Neurological: She is alert and oriented to person, place, and time. She has normal strength. No sensory deficit.  Skin: Skin is warm, dry and intact. No rash noted.  Psychiatric: She has a normal mood and affect.  Nursing note and vitals reviewed.    ED Treatments  / Results  Labs (all labs ordered are listed, but only abnormal results are displayed) Labs Reviewed  CBC WITH DIFFERENTIAL/PLATELET - Abnormal; Notable for the following:       Result Value   WBC 11.9 (*)    HCT 35.2 (*)    Neutro Abs 9.8 (*)    All other components within normal limits  COMPREHENSIVE METABOLIC PANEL - Abnormal; Notable for the following:    Glucose, Bld 230 (*)    All other components within normal limits  I-STAT CG4 LACTIC ACID, ED - Abnormal; Notable for the following:    Lactic Acid, Venous 2.53 (*)    All other components within normal limits  CULTURE, BLOOD (ROUTINE X 2)  CULTURE, BLOOD (ROUTINE X 2)  I-STAT TROPONIN, ED  I-STAT CG4 LACTIC ACID, ED   Results for orders placed or performed during the hospital encounter of 04/07/17  Lipase, blood  Result Value Ref Range   Lipase 41 11 - 51 U/L  Comprehensive metabolic panel  Result Value Ref Range   Sodium 134 (L) 135 - 145 mmol/L   Potassium 3.9 3.5 - 5.1 mmol/L   Chloride 100 (L) 101 - 111 mmol/L   CO2 22 22 - 32 mmol/L   Glucose, Bld 293 (H) 65 - 99 mg/dL   BUN 10 6 - 20 mg/dL   Creatinine, Ser 0.63 0.44 - 1.00 mg/dL   Calcium 9.5 8.9 - 10.3 mg/dL   Total Protein 7.6 6.5 - 8.1 g/dL   Albumin 4.2 3.5 - 5.0 g/dL   AST 20 15 - 41 U/L   ALT 25 14 - 54 U/L   Alkaline Phosphatase 68 38 - 126 U/L   Total Bilirubin 1.3 (H) 0.3 - 1.2 mg/dL   GFR calc non Af Amer >60 >60 mL/min   GFR calc Af Amer >60 >60 mL/min   Anion gap 12 5 - 15  CBC  Result Value Ref Range   WBC 9.2 4.0 - 10.5 K/uL   RBC 4.60 3.87 - 5.11 MIL/uL   Hemoglobin 13.1 12.0 - 15.0 g/dL   HCT 38.0 36.0 - 46.0 %   MCV 82.6 78.0 - 100.0 fL   MCH 28.5 26.0 - 34.0 pg   MCHC 34.5 30.0 - 36.0 g/dL   RDW  13.3 11.5 - 15.5 %   Platelets 231 150 - 400 K/uL  Urinalysis, Routine w reflex microscopic  Result Value Ref Range   Color, Urine YELLOW YELLOW   APPearance CLOUDY (A) CLEAR   Specific Gravity, Urine 1.012 1.005 - 1.030   pH 5.0 5.0 -  8.0   Glucose, UA >=500 (A) NEGATIVE mg/dL   Hgb urine dipstick SMALL (A) NEGATIVE   Bilirubin Urine NEGATIVE NEGATIVE   Ketones, ur 5 (A) NEGATIVE mg/dL   Protein, ur 30 (A) NEGATIVE mg/dL   Nitrite NEGATIVE NEGATIVE   Leukocytes, UA MODERATE (A) NEGATIVE   RBC / HPF 6-30 0 - 5 RBC/hpf   WBC, UA TOO NUMEROUS TO COUNT 0 - 5 WBC/hpf   Bacteria, UA FEW (A) NONE SEEN   Squamous Epithelial / LPF 0-5 (A) NONE SEEN  I-Stat Beta hCG blood, ED ([facility], [facility], [facility] only)  Result Value Ref Range   I-stat hCG, quantitative <5.0 <5 mIU/mL   Comment 3          CBG monitoring, ED  Result Value Ref Range   Glucose-Capillary 279 (H) 65 - 99 mg/dL    EKG  EKG Interpretation None       Radiology Dg Chest 2 View  Result Date: 04/07/2017 CLINICAL DATA:  Chest pain EXAM: CHEST  2 VIEW COMPARISON:  09/11/2016 FINDINGS: The heart size and mediastinal contours are within normal limits. Both lungs are clear. The visualized skeletal structures are unremarkable. IMPRESSION: No active cardiopulmonary disease. Electronically Signed   By: Franchot Gallo M.D.   On: 04/07/2017 20:22   Ct Renal Stone Study  Result Date: 04/07/2017 CLINICAL DATA:  Abdominal and left flank pain EXAM: CT ABDOMEN AND PELVIS WITHOUT CONTRAST TECHNIQUE: Multidetector CT imaging of the abdomen and pelvis was performed following the standard protocol without IV contrast. COMPARISON:  01/31/2017 FINDINGS: Lower chest:  No contributory findings. Hepatobiliary: Hepatic steatosis. No focal lesion is noted.Cholecystectomy with normal common bile duct diameter. Pancreas: Unremarkable. Spleen: Unremarkable. Adrenals/Urinary Tract: Negative adrenals. No hydronephrosis or stone. No left renal enlargement or perinephric edema. Unremarkable bladder. Stomach/Bowel: No obstruction. No evidence of inflammation. There is a small focus of calcification posterior to the colon at the descending sigmoid junction, chronic and incidental -question remote epiploic  appendagitis. No noted diverticula. Vascular/Lymphatic: No acute vascular abnormality. No mass or adenopathy. Reproductive:IUD in good position. Other: No ascites or pneumoperitoneum. Musculoskeletal: No acute abnormalities. IMPRESSION: 1. No acute finding or change from prior. 2. Hepatic steatosis. Electronically Signed   By: Monte Fantasia M.D.   On: 04/07/2017 13:11    Procedures Procedures (including critical care time)  CRITICAL CARE-sepsis  Performed by: Reece Agar   Total critical care time: 45 minutes  Critical care time was exclusive of separately billable procedures and treating other patients.  Critical care was necessary to treat or prevent imminent or life-threatening deterioration.  Critical care was time spent personally by me on the following activities: development of treatment plan with patient and/or surrogate as well as nursing, discussions with consultants, evaluation of patient's response to treatment, examination of patient, obtaining history from patient or surrogate, ordering and performing treatments and interventions, ordering and review of laboratory studies, ordering and review of radiographic studies, pulse oximetry and re-evaluation of patient's condition.   Medications Ordered in ED Medications  sodium chloride 0.9 % bolus 1,000 mL (0 mLs Intravenous Stopped 04/07/17 2230)    And  sodium chloride 0.9 % bolus 1,000 mL (1,000 mLs Intravenous New Bag/Given  04/07/17 2113)    And  sodium chloride 0.9 % bolus 1,000 mL (1,000 mLs Intravenous New Bag/Given 04/07/17 2113)    And  sodium chloride 0.9 % bolus 1,000 mL (not administered)  cefTRIAXone (ROCEPHIN) 2 g in dextrose 5 % 50 mL IVPB (not administered)  acetaminophen (TYLENOL) tablet 650 mg (650 mg Oral Given 04/07/17 2110)  lisinopril (PRINIVIL,ZESTRIL) tablet 5 mg (5 mg Oral Given 04/07/17 2111)  morphine 4 MG/ML injection 4 mg (4 mg Intravenous Given 04/07/17 2111)  gi cocktail  (Maalox,Lidocaine,Donnatal) (30 mLs Oral Given 04/07/17 2111)  cefTRIAXone (ROCEPHIN) 1 g in dextrose 5 % 50 mL IVPB (0 g Intravenous Stopped 04/07/17 2142)  ondansetron (ZOFRAN-ODT) disintegrating tablet 4 mg (4 mg Oral Given 04/07/17 2041)     Initial Impression / Assessment and Plan / ED Course  I have reviewed the triage vital signs and the nursing notes.  Pertinent labs & imaging results that were available during my care of the patient were reviewed by me and considered in my medical decision making (see chart for details).     31 y.o. female here for second visit today, for c/o ongoing fever and pyelonephritis symptoms. Reports yesterday started having L flank pain radiating to lower abdomen, nausea, urine freq, malodorous urine, and fevers. Went to [facility] today and had work up that showed: CBC WNL, CMP with hyperglycemia, U/A +leuks TNTC WBC few bacteria, UCx in process, BetaHCG neg, lipase WNL, CT renal study negative. Presumptively treated for pyelonephritis, given rocephin, toradol, morphine, and zofran and then d/c home with augmentin rx. States fevers worsening so she came back. Still having L flank pain and nausea. Then at the end of the encounter mentions that she just started having central CP while she was laying here waiting for a provider. On exam, morbidly obese, febrile 103.1 orally on recheck, reproducible tenderness to central chest and epigastric region, diffuse lower abd TTP and slightly tender diffusely throughout but mostly in lower abdomen, +L CVA TTP, mildly tachycardia, no hypoxia, BP elevated similar to prior visits (out of lisinopril for several months). She has not failed outpatient therapy since she was just started today, however she meets sepsis criteria with her pyelonephritis, and given lack of symptom control at home, and the fact that she's diabetic so this is technically a complicated UTI, she would meet criteria for hospitalization for her pyelonephritis. U/A  already done earlier and UCx already in process, don't need to repeat this. Will repeat CBC w/diff and CMP to ensure no changes, and get troponin, EKG, CXR, lactic, and BCx. Will give rocephin 1g now since earlier she only received 1g. Will start fluids, give zofran, morphine, GI cocktail, and her lisinopril dose now. Will reassess shortly, but likely she will need admission.   10:31 PM BP improving after meds, down to 130/75. CBC w/diff showing neutrophilic leukocytosis, which is new compared to earlier labs. CMP with gluc 230 but otherwise WNL. Lactic 2.53, fluids running. Trop neg. EKG with sinus tachycardia but otherwise no acute ischemic findings. CXR negative. Pt feeling better, states CP resolving, and pain/nausea improving; will proceed with admission for pyelonephritis. Discussed case with my attending Dr. Billy Fischer who agrees with plan.   11:00 PM Dr. Olevia Bowens of [facility] returning page and will admit. Holding orders to be placed by admitting team. Please see their notes for further documentation of care. I appreciate their help with this pleasant pt's care. Pt stable at time of admission.    Final Clinical Impressions(s) /  ED Diagnoses   Final diagnoses:  Sepsis, due to unspecified organism [facility])  Pyelonephritis  Nausea  Acute left flank pain  Lower abdominal pain  Type 2 diabetes mellitus with hyperglycemia, with long-term current use of insulin (HCC)  Essential hypertension  Precordial chest pain  Neutrophilic leukocytosis    New Prescriptions New Prescriptions   No medications on [address] [place], [place] 04/07/17 [place], Erin, MD 04/08/17 2241

## 2017-04-07 NOTE — H&P (Signed)
History and Physical    Melissa Washington XNT:[id number] DOB: 09/05/1986 DOA: 04/07/2017  PCP: System, Provider Not In   Patient coming from: Home.  I have personally briefly reviewed patient's old medical records in [place]  Chief Complaint: Fever and kidney infection.  HPI: Melissa Washington is a 31 y.o. female with medical history significant of acute promyelocytic leukemia in remission, history of Port-A-Cath line bacteremia due to Escherichia coli, history of cholelithiasis, history of urolithiasis, collagenous vascular disease, type 2 diabetes, headache, hepatic steatosis, hypertension, morbid obesity who is coming to the emergency department due to fever, chills, fatigue, lack flank pain, nausea, foul-smelling urine, dysuria and urinary frequency since yesterday. She was seen earlier today [facility] and was discharged home on oral antibiotics. However, she states that her symptoms recurred so not she is coming to [facility] emergency department for reevaluation.  Per patient, about 2 weeks ago she started having mild dysuria, malodorous and darker urine. These symptoms subsided for several days, then recurred despite that she has been drinking water regularly. She mentions that since Monday, her symptoms have been a lot worse with fever, chills, night sweats, fatigue, malaise, nausea, left flank pain radiated to her left lower abdomen and suprapubic areas, dysuria and frequency. She has been taking Tylenol at home with partial relief. She denies headache, sore throat, dyspnea, chest pain, palpitations, dizziness, PND, orthopnea or pitting edema lower extremities. She denies emesis, diarrhea, constipation, melena or hematochezia.  ED Course: Her most recent vital signs temperature 102.43F, pulse 112, respirations 24, blood pressure 130/75 mmHg, O2 sat 98% and weight 117.9 kg. She was given acetaminophen 650 mg by mouth 1 dose, lisinopril 5 mg by mouth 1 dose, a 4000 mL normal saline bolus,  morphine 4 mg IVP, ondansetron 4 mg oral, and ceftriaxone 1 g IVPB (she already received 1 g at [facility]).  Workup shows a urinalysis with glucosuria, pyuria, small hemoglobinuria and trace ketones. Her WBC was 11.9 with 82% neutrophils, hemoglobin 12.2 g/dL and platelets 226. Her CMP shows a glucose of 230 mg/dL, but was otherwise unremarkable. Her chest radiograph did not show any acute pulmonary pathology. CT renal protocol showed hepatic steatosis, but no other abnormality.  Review of Systems: As per HPI otherwise 10 point review of systems negative.  Past Medical History:  Diagnosis Date  . 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 Bacteremia associated with IV line ([place]) 05/01/2013   Port-a-cath infection, removed 8/6 by IR. Ecoli grew out in cultures. Sent home with Ceftriaxone to complete 14 day course.   . Bacteremia due to Escherichia coli 05/01/2013   Port-a-cath infection, removed 8/6 by IR. Ecoli grew out in cultures. Sent home with Ceftriaxone to complete 14 day course.   . Cholelithiasis 01/17/2012  . Collagen vascular disease ([place])   . Diabetes mellitus    type 2, insulin  . Headache(784.0)   . Hepatic steatosis 01/17/2012  . Hypertension   . Leukemia, acute monocytic, in remission ([place])   . Nephrolithiasis   . Obesity     Past Surgical History:  Procedure Laterality Date  . CHOLECYSTECTOMY  01/31/2012   Procedure: LAPAROSCOPIC CHOLECYSTECTOMY WITH INTRAOPERATIVE CHOLANGIOGRAM;  Surgeon: Marcello Moores A. Cornett, MD;  Location: [place];  Service: General;  Laterality: N/A;  . PORTACATH PLACEMENT     [place]     reports that she has quit smoking. She has never used smokeless tobacco. She reports that she does not drink alcohol or use drugs.  Allergies  Allergen Reactions  . Ultram [Tramadol Hcl] Nausea And Vomiting    Family History  Problem Relation Age of Onset  . Kidney disease Mother   . Hypertension Mother   . Epilepsy Mother   . Sleep apnea Mother   .  Diabetes Father   . Kidney disease Maternal Grandmother   . Diabetes Maternal Grandmother     Prior to Admission medications   Medication Sig Start Date End Date Taking? Authorizing Provider  levonorgestrel (MIRENA) 20 MCG/24HR IUD 1 each by Intrauterine route continuous.   Yes [provider]  ACCU-CHEK FASTCLIX LANCETS MISC 1 each by Does not apply route 3 (three) times daily. Check sugar 3x daily 06/07/14   Kuneff, Renee A, DO  amoxicillin (AMOXIL) 875 MG tablet Take 1 tablet (875 mg total) by mouth 2 (two) times daily. Patient not taking: Reported on 01/29/2017 08/31/16   Robyn Haber, MD  amoxicillin-clavulanate (AUGMENTIN) 875-125 MG tablet Take 1 tablet by mouth 2 (two) times daily. 04/07/17 04/17/17  Rodell Perna A, PA-C  Blood Glucose Monitoring Suppl (ACCU-CHEK NANO SMARTVIEW) W/DEVICE KIT Check blood glucose x3 daily. Must take morning fasting blood glucose 06/07/14   Kuneff, Renee A, DO  etodolac (LODINE) 500 MG tablet Take 1 tablet (500 mg total) by mouth 2 (two) times daily. Patient not taking: Reported on 04/07/2017 03/22/17   Mcarthur Rossetti, MD  glucose blood (ACCU-CHEK SMARTVIEW) test strip Check sugar 3 x daily 06/07/14   Kuneff, Renee A, DO  HYDROcodone-acetaminophen (NORCO) 5-325 MG tablet Take 1-2 tablets by mouth every 4 (four) hours as needed. Patient not taking: Reported on 04/07/2017 01/29/17   Margarita Mail, PA-C  insulin detemir (LEVEMIR) 100 UNIT/ML injection Inject 0.2 mLs (20 Units total) into the skin at bedtime. Patient not taking: Reported on 01/29/2017 03/29/15   Patrecia Pour, MD  Insulin Glargine (LANTUS) 100 UNIT/ML Solostar Pen Inject 20 Units into the skin daily at 10 pm. Patient not taking: Reported on 04/07/2017 12/29/15   Pisciotta, Elmyra Ricks, PA-C  Insulin Pen Needle 29G X 8MM MISC As directed 12/29/15   Pisciotta, Elmyra Ricks, PA-C  Insulin Syringe-Needle U-100 (INSULIN SYRINGE 1CC/31GX5/16") 31G X 5/16" 1 ML MISC Use as directed to inject insulin.  01/20/15   Presson, Audelia Hives, PA  lisinopril (PRINIVIL,ZESTRIL) 5 MG tablet Take 1 tablet (5 mg total) by mouth daily. Patient not taking: Reported on 04/07/2017 06/19/14   Howard Pouch A, DO  meloxicam (MOBIC) 7.5 MG tablet Take 2 tablets (15 mg total) by mouth daily. Patient not taking: Reported on 01/29/2017 07/14/16   Larene Pickett, PA-C  metFORMIN (GLUCOPHAGE) 1000 MG tablet Take 1 tablet (1,000 mg total) by mouth 2 (two) times daily with a meal. Patient not taking: Reported on 04/07/2017 03/29/15   Patrecia Pour, MD  mupirocin ointment Drue Stager) 2 % Apply to skin bid Patient not taking: Reported on 01/29/2017 05/11/16   Billy Fischer, MD  omeprazole (PRILOSEC) 20 MG capsule Take 1 capsule (20 mg total) by mouth daily. Patient not taking: Reported on 04/07/2017 01/31/17   Muthersbaugh, Jarrett Soho, PA-C  ondansetron (ZOFRAN ODT) 4 MG disintegrating tablet Take 1 tablet (4 mg total) by mouth every 8 (eight) hours as needed for nausea or vomiting. 04/07/17 04/11/17  Rodell Perna A, PA-C  promethazine (PHENERGAN) 25 MG tablet Take 1 tablet (25 mg total) by mouth every 6 (six) hours as needed for nausea or vomiting. Patient not taking: Reported on 04/07/2017 01/29/17   Margarita Mail, PA-C  topiramate (TOPAMAX) 25 MG tablet Take 1 tablet (25 mg total) by mouth daily. Patient not taking: Reported on 01/29/2017 08/31/16   Robyn Haber, MD    Physical Exam: Vitals:   04/07/17 2045 04/07/17 2117 04/07/17 2145 04/07/17 2200  BP: (!) 159/97 (!) 159/97 (!) 139/93 130/75  Pulse: (!) 102 (!) 109 (!) 112 (!) 112  Resp: (!) 24 20 (!) 24 (!) 24  Temp:      TempSrc:      SpO2: 100% 99% 97% 98%  Weight:      Height:        Constitutional: Mildly febrile at 99.53F, but in NAD, calm, comfortable Eyes: PERRL, lids and conjunctivae normal ENMT: Mucous membranes are moist. Posterior pharynx clear of any exudate or lesions.  Neck: normal, supple, no masses, no thyromegaly Respiratory: clear to auscultation  bilaterally, no wheezing, no crackles. Normal respiratory effort. No accessory muscle use.  Cardiovascular: Tachycardic at 106 BPM, no murmurs / rubs / gallops. No extremity edema. 2+ pedal pulses. No carotid bruits.  Abdomen: Soft, positive suprapubic and LLQ tenderness, positive left CVA tenderness, no guarding is/rebound/masses palpated. No hepatosplenomegaly. Bowel sounds positive.  Musculoskeletal: no clubbing / cyanosis. No joint deformity upper and lower extremities. Good ROM, no contractures. Normal muscle tone.  Skin: no rashes, lesions, ulcers on limited skin exam. Neurologic: CN 2-12 grossly intact. Sensation intact, DTR normal. Strength 5/5 in all 4.  Psychiatric: Normal judgment and insight. Alert and oriented x 4. Normal mood.    Labs on Admission: I have personally reviewed following labs and imaging studies  CBC:  Recent Labs Lab 04/07/17 1051 04/07/17 2053  WBC 9.2 11.9*  NEUTROABS  --  9.8*  HGB 13.1 12.2  HCT 38.0 35.2*  MCV 82.6 82.8  PLT 231 373   Basic Metabolic Panel:  Recent Labs Lab 04/07/17 1051 04/07/17 2053  NA 134* 138  K 3.9 3.8  CL 100* 101  CO2 22 25  GLUCOSE 293* 230*  BUN 10 16  CREATININE 0.63 0.88  CALCIUM 9.5 9.2   GFR: Estimated Creatinine Clearance: 116 mL/min (by C-G formula based on SCr of 0.88 mg/dL). Liver Function Tests:  Recent Labs Lab 04/07/17 1051 04/07/17 2053  AST 20 35  ALT 25 30  ALKPHOS 68 70  BILITOT 1.3* 1.2  PROT 7.6 8.0  ALBUMIN 4.2 4.2    Recent Labs Lab 04/07/17 1051  LIPASE 41   No results for input(s): AMMONIA in the last 168 hours. Coagulation Profile: No results for input(s): INR, PROTIME in the last 168 hours. Cardiac Enzymes: No results for input(s): CKTOTAL, CKMB, CKMBINDEX, TROPONINI in the last 168 hours. BNP (last 3 results) No results for input(s): PROBNP in the last 8760 hours. HbA1C: No results for input(s): HGBA1C in the last 72 hours. CBG:  Recent Labs Lab 04/07/17 1053    GLUCAP 279*   Lipid Profile: No results for input(s): CHOL, HDL, LDLCALC, TRIG, CHOLHDL, LDLDIRECT in the last 72 hours. Thyroid Function Tests: No results for input(s): TSH, T4TOTAL, FREET4, T3FREE, THYROIDAB in the last 72 hours. Anemia Panel: No results for input(s): VITAMINB12, FOLATE, FERRITIN, TIBC, IRON, RETICCTPCT in the last 72 hours. Urine analysis:    Component Value Date/Time   COLORURINE YELLOW 04/07/2017 1112   APPEARANCEUR CLOUDY (A) 04/07/2017 1112   LABSPEC 1.012 04/07/2017 1112   PHURINE 5.0 04/07/2017 1112   GLUCOSEU >=500 (A) 04/07/2017 1112   HGBUR SMALL (A) 04/07/2017 1112   HGBUR moderate 01/29/2009 1517  BILIRUBINUR NEGATIVE 04/07/2017 1112   BILIRUBINUR NEG 01/19/2013 1123   KETONESUR 5 (A) 04/07/2017 1112   PROTEINUR 30 (A) 04/07/2017 1112   UROBILINOGEN 0.2 05/01/2015 0020   NITRITE NEGATIVE 04/07/2017 1112   LEUKOCYTESUR MODERATE (A) 04/07/2017 1112    Radiological Exams on Admission: Dg Chest 2 View  Result Date: 04/07/2017 CLINICAL DATA:  Chest pain EXAM: CHEST  2 VIEW COMPARISON:  09/11/2016 FINDINGS: The heart size and mediastinal contours are within normal limits. Both lungs are clear. The visualized skeletal structures are unremarkable. IMPRESSION: No active cardiopulmonary disease. Electronically Signed   By: Franchot Gallo M.D.   On: 04/07/2017 20:22   Ct Renal Stone Study  Result Date: 04/07/2017 CLINICAL DATA:  Abdominal and left flank pain EXAM: CT ABDOMEN AND PELVIS WITHOUT CONTRAST TECHNIQUE: Multidetector CT imaging of the abdomen and pelvis was performed following the standard protocol without IV contrast. COMPARISON:  01/31/2017 FINDINGS: Lower chest:  No contributory findings. Hepatobiliary: Hepatic steatosis. No focal lesion is noted.Cholecystectomy with normal common bile duct diameter. Pancreas: Unremarkable. Spleen: Unremarkable. Adrenals/Urinary Tract: Negative adrenals. No hydronephrosis or stone. No left renal enlargement or  perinephric edema. Unremarkable bladder. Stomach/Bowel: No obstruction. No evidence of inflammation. There is a small focus of calcification posterior to the colon at the descending sigmoid junction, chronic and incidental -question remote epiploic appendagitis. No noted diverticula. Vascular/Lymphatic: No acute vascular abnormality. No mass or adenopathy. Reproductive:IUD in good position. Other: No ascites or pneumoperitoneum. Musculoskeletal: No acute abnormalities. IMPRESSION: 1. No acute finding or change from prior. 2. Hepatic steatosis. Electronically Signed   By: Monte Fantasia M.D.   On: 04/07/2017 13:11    EKG: Independently reviewed. Vent. rate 109 BPM PR interval * ms QRS duration 106 ms QT/QTc 292/394 ms P-R-T axes 64 31 262 Sinus tachycardia Borderline repolarization abnormality  Assessment/Plan Principal Problem:   Acute pyelonephritis Admit to telemetry unit/inpatient Continue IV hydration. Continue analgesics as needed. Continue antiemetics as needed. Continue ceftriaxone 2 g IV PB every 24 hours. Follow-up blood cultures and sensitivity. Follow-up urine culture and sensitivity.  Active Problems:   DM (diabetes mellitus), type 2, uncontrolled ([place]) Hold metformin due to lactic acidosis. Carbohydrate modified diet. CBG monitoring with regular insulin sliding scale while in the hospital.    HYPERTENSION, BENIGN ESSENTIAL Resume lisinopril 5 mg by mouth daily. Monitor blood pressure, renal function and electrolytes.    Anxiety associated with depression Mildly anxious at this time due to illness, but not having symptoms or taking medication for depression     DVT prophylaxis: Heparin SQ. Code Status: Full code. Family Communication:  Disposition Plan: Admit for IV antibiotics for 2-3 days. Consults called:  Admission status: Inpatient/telemetry.   Reubin Milan MD [facility] Pager [phone].  If 7PM-7AM, please contact  night-coverage [email or web address] Password [email or web address]  04/07/2017, 11:02 PM

## 2017-04-07 NOTE — ED Notes (Signed)
Attempted IV start on patient, unsuccessful. IV team consulted, PA made aware

## 2017-04-07 NOTE — ED Provider Notes (Signed)
[facility] DEPT Provider Note   CSN: [id number] Arrival date & time: 04/07/17  1031     History   Chief Complaint Chief Complaint  Patient presents with  . Abdominal Pain  . Fever    HPI Melissa Washington is a 31 y.o. female with PMHx insulin-dependent DM type II, HTN, obesity, cholelithiasis who presents a with chief complaint acute onset, constant abdominal pain which began last night. She notes constant lower abdominal pain which is achy in nature with intermittent sharp pain. Also has left-sided flank pain which is constant and sharp in nature. Also states she had a fever of 100.24F last night and endorses constant nausea. Pain worsens with palpation. No alleviating factors noted. She has not tried any medications. She does note dark urine as well as urinary frequency but denies hematuria or dysuria. States she has a history of kidney stones and this feels similar. He has not been on diabetes medications in several months, but states she should be on metformin, lisinopril, and Lantus. She states "every time I try to get medication from my doctor there is a mix up and it never works out ". Denies chest pain, shortness of breath, constipation, melena, vaginal pain, vaginal bleeding, or abnormal vaginal discharge.  The history is provided by the patient.    Past Medical History:  Diagnosis Date  . 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 Bacteremia associated with IV line ([place]) 05/01/2013   Port-a-cath infection, removed 8/6 by IR. Ecoli grew out in cultures. Sent home with Ceftriaxone to complete 14 day course.   . Bacteremia due to Escherichia coli 05/01/2013   Port-a-cath infection, removed 8/6 by IR. Ecoli grew out in cultures. Sent home with Ceftriaxone to complete 14 day course.   . Cholelithiasis 01/17/2012  . Collagen vascular disease ([place])   . Diabetes mellitus    type 2, insulin  . Headache(784.0)   . Hepatic steatosis 01/17/2012  . Hypertension   .  Leukemia, acute monocytic, in remission ([place])   . Nephrolithiasis   . Obesity     Patient Active Problem List   Diagnosis Date Noted  . Vaginitis and vulvovaginitis 10/09/2014  . Alopecia 06/26/2014  . Bilateral hip pain 06/19/2014  . Anxiety associated with depression 06/19/2014  . Allergic rhinitis 06/07/2014  . IUD complication ([place]) 52/77/8242  . Exposure to STD 06/07/2014  . 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 Morbid obesity with BMI of 50.0-59.9, adult ([place]) 01/20/2012  . DM (diabetes mellitus), type 2, uncontrolled ([place]) 12/30/2006  . HYPERTENSION, BENIGN ESSENTIAL 12/30/2006    Past Surgical History:  Procedure Laterality Date  . CHOLECYSTECTOMY  01/31/2012   Procedure: LAPAROSCOPIC CHOLECYSTECTOMY WITH INTRAOPERATIVE CHOLANGIOGRAM;  Surgeon: Marcello Moores A. Cornett, MD;  Location: [facility] OR;  Service: General;  Laterality: N/A;  . PORTACATH PLACEMENT     [place]    OB History    Gravida Para Term Preterm AB Living   '2 2 2 ' 0 0 2   SAB TAB Ectopic Multiple Live Births   0 0 0 0 2       Home Medications    Prior to Admission medications   Medication Sig Start Date End Date Taking? Authorizing Provider  ACCU-CHEK FASTCLIX LANCETS MISC 1 each by Does not apply route 3 (three) times daily. Check sugar 3x daily Patient not taking: Reported on 01/29/2017 06/07/14   Howard Pouch A, DO  amoxicillin (AMOXIL) 875 MG tablet Take 1 tablet (875 mg  total) by mouth 2 (two) times daily. Patient not taking: Reported on 01/29/2017 08/31/16   Robyn Haber, MD  amoxicillin-clavulanate (AUGMENTIN) 875-125 MG tablet Take 1 tablet by mouth 2 (two) times daily. 04/07/17 04/17/17  Rodell Perna A, PA-C  benzonatate (TESSALON) 200 MG capsule Take 1 capsule (200 mg total) by mouth 3 (three) times daily as needed for cough. Patient not taking: Reported on 01/29/2017 09/11/16   Orpah Greek, MD  Blood Glucose Monitoring Suppl (ACCU-CHEK NANO SMARTVIEW) W/DEVICE KIT Check  blood glucose x3 daily. Must take morning fasting blood glucose Patient not taking: Reported on 01/29/2017 06/07/14   Howard Pouch A, DO  cyclobenzaprine (FLEXERIL) 10 MG tablet Take 1 tablet (10 mg total) by mouth at bedtime. Patient not taking: Reported on 01/29/2017 08/31/16   Robyn Haber, MD  etodolac (LODINE) 500 MG tablet Take 1 tablet (500 mg total) by mouth 2 (two) times daily. 03/22/17   Mcarthur Rossetti, MD  glucose blood (ACCU-CHEK SMARTVIEW) test strip Check sugar 3 x daily Patient not taking: Reported on 01/29/2017 06/07/14   Howard Pouch A, DO  HYDROcodone-acetaminophen (NORCO) 5-325 MG tablet Take 1-2 tablets by mouth every 4 (four) hours as needed. 01/29/17   Harris, Abigail, PA-C  insulin detemir (LEVEMIR) 100 UNIT/ML injection Inject 0.2 mLs (20 Units total) into the skin at bedtime. Patient not taking: Reported on 01/29/2017 03/29/15   Patrecia Pour, MD  Insulin Glargine (LANTUS) 100 UNIT/ML Solostar Pen Inject 20 Units into the skin daily at 10 pm. 12/29/15   Pisciotta, Elmyra Ricks, PA-C  Insulin Pen Needle 29G X 8MM MISC As directed Patient not taking: Reported on 01/29/2017 12/29/15   Pisciotta, Elmyra Ricks, PA-C  Insulin Syringe-Needle U-100 (INSULIN SYRINGE 1CC/31GX5/16") 31G X 5/16" 1 ML MISC Use as directed to inject insulin. 01/20/15   Presson, Audelia Hives, PA  levofloxacin (LEVAQUIN) 750 MG tablet Take 1 tablet (750 mg total) by mouth daily. Patient not taking: Reported on 01/29/2017 09/11/16   Orpah Greek, MD  levonorgestrel (MIRENA) 20 MCG/24HR IUD 1 each by Intrauterine route continuous.    [provider]  lisinopril (PRINIVIL,ZESTRIL) 5 MG tablet Take 1 tablet (5 mg total) by mouth daily. 06/19/14   Kuneff, Renee A, DO  meloxicam (MOBIC) 7.5 MG tablet Take 2 tablets (15 mg total) by mouth daily. Patient not taking: Reported on 01/29/2017 07/14/16   Larene Pickett, PA-C  metFORMIN (GLUCOPHAGE) 1000 MG tablet Take 1 tablet (1,000 mg total) by mouth 2 (two)  times daily with a meal. 03/29/15   Patrecia Pour, MD  mupirocin ointment (BACTROBAN) 2 % Apply to skin bid Patient not taking: Reported on 01/29/2017 05/11/16   Billy Fischer, MD  naproxen (NAPROSYN) 500 MG tablet Take 1 tablet (500 mg total) by mouth 2 (two) times daily. 06/16/16   Etta Quill, NP  omeprazole (PRILOSEC) 20 MG capsule Take 1 capsule (20 mg total) by mouth daily. 01/31/17   Muthersbaugh, Jarrett Soho, PA-C  ondansetron (ZOFRAN ODT) 4 MG disintegrating tablet Take 1 tablet (4 mg total) by mouth every 8 (eight) hours as needed for nausea or vomiting. 04/07/17 04/11/17  Rodell Perna A, PA-C  promethazine (PHENERGAN) 25 MG tablet Take 1 tablet (25 mg total) by mouth every 6 (six) hours as needed for nausea or vomiting. 01/29/17   Margarita Mail, PA-C  topiramate (TOPAMAX) 25 MG tablet Take 1 tablet (25 mg total) by mouth daily. Patient not taking: Reported on 01/29/2017 08/31/16   Robyn Haber, MD  Family History Family History  Problem Relation Age of Onset  . Kidney disease Mother   . Hypertension Mother   . Epilepsy Mother   . Sleep apnea Mother   . Diabetes Father   . Kidney disease Maternal Grandmother   . Diabetes Maternal Grandmother     Social History Social History  Substance Use Topics  . Smoking status: Former Research scientist (life sciences)  . Smokeless tobacco: Never Used  . Alcohol use No     Allergies   Ultram [tramadol hcl]   Review of Systems Review of Systems  Constitutional: Positive for fatigue and fever.  Respiratory: Negative for shortness of breath.   Cardiovascular: Negative for chest pain.  Gastrointestinal: Positive for abdominal pain, diarrhea (chronic, unchanged) and nausea. Negative for constipation and vomiting.  Genitourinary: Positive for flank pain and frequency. Negative for dysuria, hematuria, vaginal bleeding, vaginal discharge and vaginal pain.  Neurological: Negative for syncope.     Physical Exam Updated Vital Signs BP 106/65   Pulse 85   Temp  99.7 F (37.6 C) (Oral)   Resp 16   Ht '5\' 2"'  (1.575 m)   Wt 117.9 kg (260 lb)   LMP 01/06/2017   SpO2 98%   BMI 47.55 kg/m   Physical Exam  Constitutional: She appears well-developed and well-nourished. No distress.  HENT:  Head: Normocephalic and atraumatic.  Eyes: Conjunctivae are normal. Right eye exhibits no discharge. Left eye exhibits no discharge.  Neck: No JVD present. No tracheal deviation present.  Cardiovascular: Normal rate, regular rhythm and normal heart sounds.   Pulmonary/Chest: Effort normal and breath sounds normal.  Abdominal: Soft. She exhibits no distension. There is tenderness.  Lower abdominal pain, maximally tender in the suprapubic region. Left CVA tenderness. Murphy's absent, Rovsing's absent. Hypoactive bowel sounds  Musculoskeletal: She exhibits no edema.  No midline spine TTP, left-sided paraspinal muscle and flank tenderness, no deformity, crepitus, or step-off noted   Neurological: She is alert.  Skin: Skin is warm and dry. Capillary refill takes less than 2 seconds. No erythema.  Psychiatric: She has a normal mood and affect. Her behavior is normal.  Nursing note and vitals reviewed.    ED Treatments / Results  Labs (all labs ordered are listed, but only abnormal results are displayed) Labs Reviewed  COMPREHENSIVE METABOLIC PANEL - Abnormal; Notable for the following:       Result Value   Sodium 134 (*)    Chloride 100 (*)    Glucose, Bld 293 (*)    Total Bilirubin 1.3 (*)    All other components within normal limits  URINALYSIS, ROUTINE W REFLEX MICROSCOPIC - Abnormal; Notable for the following:    APPearance CLOUDY (*)    Glucose, UA >=500 (*)    Hgb urine dipstick SMALL (*)    Ketones, ur 5 (*)    Protein, ur 30 (*)    Leukocytes, UA MODERATE (*)    Bacteria, UA FEW (*)    Squamous Epithelial / LPF 0-5 (*)    All other components within normal limits  CBG MONITORING, ED - Abnormal; Notable for the following:    Glucose-Capillary  279 (*)    All other components within normal limits  URINE CULTURE  LIPASE, BLOOD  CBC  I-STAT BETA HCG BLOOD, ED ([facility], [facility], [facility] ONLY)    EKG  EKG Interpretation None       Radiology Ct Renal Stone Study  Result Date: 04/07/2017 CLINICAL DATA:  Abdominal and left flank pain EXAM: CT ABDOMEN  AND PELVIS WITHOUT CONTRAST TECHNIQUE: Multidetector CT imaging of the abdomen and pelvis was performed following the standard protocol without IV contrast. COMPARISON:  01/31/2017 FINDINGS: Lower chest:  No contributory findings. Hepatobiliary: Hepatic steatosis. No focal lesion is noted.Cholecystectomy with normal common bile duct diameter. Pancreas: Unremarkable. Spleen: Unremarkable. Adrenals/Urinary Tract: Negative adrenals. No hydronephrosis or stone. No left renal enlargement or perinephric edema. Unremarkable bladder. Stomach/Bowel: No obstruction. No evidence of inflammation. There is a small focus of calcification posterior to the colon at the descending sigmoid junction, chronic and incidental -question remote epiploic appendagitis. No noted diverticula. Vascular/Lymphatic: No acute vascular abnormality. No mass or adenopathy. Reproductive:IUD in good position. Other: No ascites or pneumoperitoneum. Musculoskeletal: No acute abnormalities. IMPRESSION: 1. No acute finding or change from prior. 2. Hepatic steatosis. Electronically Signed   By: Monte Fantasia M.D.   On: 04/07/2017 13:11    Procedures Procedures (including critical care time)  Medications Ordered in ED Medications  sodium chloride 0.9 % bolus 1,000 mL (0 mLs Intravenous Stopped 04/07/17 1418)  ondansetron (ZOFRAN) injection 4 mg (4 mg Intravenous Given 04/07/17 1216)  ketorolac (TORADOL) 30 MG/ML injection 30 mg (30 mg Intravenous Given 04/07/17 1216)  cefTRIAXone (ROCEPHIN) 1 g in dextrose 5 % 50 mL IVPB (0 g Intravenous Stopped 04/07/17 1418)  morphine 4 MG/ML injection 2 mg (2 mg Intravenous Given 04/07/17 1359)     Initial  Impression / Assessment and Plan / ED Course  I have reviewed the triage vital signs and the nursing notes.  Pertinent labs & imaging results that were available during my care of the patient were reviewed by me and considered in my medical decision making (see chart for details).     Patient with complaint of abdominal pain and flank pain with fever at home. Afebrile, vital signs are stable in the ED. No leukocytosis, no significant electrolyte abnormality lipase is normal. UA concerning for infection, sent for culture. CT renal stone study shows no evidence of obstructive stone. Low suspicion of PID, TOA, ectopic pregnancy, appendicitis. Suspect pyelonephritis. Pain managed while in the ED with morphine. Nausea managed with Zofran. Given Rocephin in the ED, stable for discharge with Augmentin and follow-up with primary care in the next 4-5 days. Consult case management for assistance with medication management. Discussed indications for return to the ED. Pt verbalized understanding of and agreement with plan and is safe for discharge home at this time.   Final Clinical Impressions(s) / ED Diagnoses   Final diagnoses:  Acute pyelonephritis    New Prescriptions Discharge Medication List as of 04/07/2017  2:19 PM    START taking these medications   Details  amoxicillin-clavulanate (AUGMENTIN) 875-125 MG tablet Take 1 tablet by mouth 2 (two) times daily., Starting Wed 04/07/2017, Until Sat 04/17/2017, Print         Renita Papa, PA-C 04/07/17 1635    Virgel Manifold, MD 04/17/17 1445

## 2017-04-07 NOTE — ED Notes (Signed)
IV team at bedside 

## 2017-04-07 NOTE — Progress Notes (Addendum)
PHARMACY NOTE -  Ceftriaxone  Pharmacy has been consulted to dose Ceftriaxone for UTI.  Patient was seen in [facility] ED earlier today and received Ceftriaxone dose there this afternoon. Weight 118 kg  Plan: Ceftriaxone 2g IV q24h.  Opting for 2g dose for UTI indication based on patient's weight.  Start this dose tomorrow since patient already received a dose of Ceftriaxone today. No renal adjustment needed; therefore, pharmacy will sign off since need for further dosage adjustment appears unlikely at present.    Please reconsult if a change in clinical status warrants re-evaluation of dosage.  Thank you for the consult.  Hershal Coria, PharmD, BCPS Pager: [phone] 01/29/2016 2:57 PM

## 2017-04-07 NOTE — ED Notes (Signed)
Pt. Transported to CT 

## 2017-04-07 NOTE — ED Triage Notes (Signed)
Pt states abdominal pain, nausea and temp of 100.9 since last night.  States urine darker than normal, but denies changes in bowel habits.  States is insulin dependant diabetic that has not taken diabetes meds in several months.

## 2017-04-07 NOTE — Discharge Instructions (Signed)
Please take all of your antibiotics until finished!   You may develop abdominal discomfort or diarrhea from the antibiotic.  You may help offset this with probiotics which you can buy or get in yogurt. Do not eat  or take the probiotics until 2 hours after your antibiotic.   Take Zofran as needed for nausea. Take ibuprofen or Tylenol as needed for fever and pain. Drink plenty of water and get plenty of rest. Follow-up with your primary care physician for reevaluation in the next 4-5 days. Return to the ED immediately if any concerning signs or symptoms develop such as severe pain, fever that is not controlled with Tylenol or Motrin, blood in your urine or stool.

## 2017-04-08 DIAGNOSIS — N10 Acute tubulo-interstitial nephritis: Secondary | ICD-10-CM

## 2017-04-08 LAB — CBC WITH DIFFERENTIAL/PLATELET
BASOS PCT: 0 %
Basophils Absolute: 0 10*3/uL (ref 0.0–0.1)
Eosinophils Absolute: 0 10*3/uL (ref 0.0–0.7)
Eosinophils Relative: 0 %
HEMATOCRIT: 31 % — AB (ref 36.0–46.0)
HEMOGLOBIN: 10.8 g/dL — AB (ref 12.0–15.0)
LYMPHS ABS: 1.8 10*3/uL (ref 0.7–4.0)
LYMPHS PCT: 16 %
MCH: 28.2 pg (ref 26.0–34.0)
MCHC: 34.8 g/dL (ref 30.0–36.0)
MCV: 80.9 fL (ref 78.0–100.0)
MONO ABS: 0.8 10*3/uL (ref 0.1–1.0)
MONOS PCT: 7 %
NEUTROS ABS: 8.6 10*3/uL — AB (ref 1.7–7.7)
NEUTROS PCT: 77 %
Platelets: 179 10*3/uL (ref 150–400)
RBC: 3.83 MIL/uL — ABNORMAL LOW (ref 3.87–5.11)
RDW: 13 % (ref 11.5–15.5)
WBC: 11.2 10*3/uL — ABNORMAL HIGH (ref 4.0–10.5)

## 2017-04-08 LAB — GLUCOSE, CAPILLARY
GLUCOSE-CAPILLARY: 278 mg/dL — AB (ref 65–99)
GLUCOSE-CAPILLARY: 208 mg/dL — AB (ref 65–99)
Glucose-Capillary: 216 mg/dL — ABNORMAL HIGH (ref 65–99)
Glucose-Capillary: 256 mg/dL — ABNORMAL HIGH (ref 65–99)

## 2017-04-08 LAB — BASIC METABOLIC PANEL
ANION GAP: 10 (ref 5–15)
BUN: 13 mg/dL (ref 6–20)
CALCIUM: 7.9 mg/dL — AB (ref 8.9–10.3)
CHLORIDE: 106 mmol/L (ref 101–111)
CO2: 21 mmol/L — AB (ref 22–32)
Creatinine, Ser: 0.73 mg/dL (ref 0.44–1.00)
GFR calc non Af Amer: >60 mL/min (ref >60)
GLUCOSE: 235 mg/dL — AB (ref 65–99)
Potassium: 3.4 mmol/L — ABNORMAL LOW (ref 3.5–5.1)
Sodium: 137 mmol/L (ref 135–145)

## 2017-04-08 LAB — I-STAT CG4 LACTIC ACID, ED: Lactic Acid, Venous: 0.81 mmol/L (ref 0.5–1.9)

## 2017-04-08 MED ORDER — ONDANSETRON 4 MG PO TBDP: 4.0000 mg | ORAL_TABLET | Freq: Three times a day (TID) | ORAL | Status: DC | PRN

## 2017-04-08 MED ORDER — IBUPROFEN 200 MG PO TABS
400.0000 mg | ORAL_TABLET | Freq: Four times a day (QID) | ORAL | Status: DC | PRN
Start: 2017-04-08 — End: 2017-04-11
  Administered 2017-04-08 – 2017-04-10 (×3): 400 mg via ORAL
  Filled 2017-04-08 (×3): qty 2

## 2017-04-08 MED ORDER — INSULIN GLARGINE 100 UNIT/ML ~~LOC~~ SOLN
20.0000 [IU] | Freq: Every day | SUBCUTANEOUS | Status: DC
  Administered 2017-04-08: 20 [IU] via SUBCUTANEOUS
  Filled 2017-04-08: qty 0.2

## 2017-04-08 MED ORDER — MORPHINE SULFATE (PF) 4 MG/ML IV SOLN
4.0000 mg | INTRAVENOUS | Status: AC | PRN
  Administered 2017-04-08 – 2017-04-09 (×4): 4 mg via INTRAVENOUS
  Filled 2017-04-08 (×4): qty 1

## 2017-04-08 MED ORDER — ONDANSETRON HCL 4 MG/2ML IJ SOLN
4.0000 mg | Freq: Four times a day (QID) | INTRAMUSCULAR | Status: DC | PRN
  Administered 2017-04-08 – 2017-04-09 (×2): 4 mg via INTRAVENOUS
  Filled 2017-04-08 (×2): qty 2

## 2017-04-08 MED ORDER — ENOXAPARIN SODIUM 40 MG/0.4ML ~~LOC~~ SOLN
40.0000 mg | SUBCUTANEOUS | Status: DC
  Filled 2017-04-08: qty 0.4

## 2017-04-08 MED ORDER — INSULIN ASPART 100 UNIT/ML ~~LOC~~ SOLN
0.0000 [IU] | Freq: Three times a day (TID) | SUBCUTANEOUS | Status: DC
  Administered 2017-04-08: 5 [IU] via SUBCUTANEOUS
  Administered 2017-04-08 (×2): 8 [IU] via SUBCUTANEOUS

## 2017-04-08 MED ORDER — PANTOPRAZOLE SODIUM 40 MG PO TBEC
40.0000 mg | DELAYED_RELEASE_TABLET | Freq: Every day | ORAL | Status: DC
  Administered 2017-04-08 – 2017-04-11 (×5): 40 mg via ORAL
  Filled 2017-04-08 (×5): qty 1

## 2017-04-08 MED ORDER — ENOXAPARIN SODIUM 60 MG/0.6ML ~~LOC~~ SOLN
60.0000 mg | SUBCUTANEOUS | Status: DC
  Administered 2017-04-08 – 2017-04-10 (×3): 60 mg via SUBCUTANEOUS
  Filled 2017-04-08 (×4): qty 0.6

## 2017-04-08 MED ORDER — ACETAMINOPHEN 325 MG PO TABS
650.0000 mg | ORAL_TABLET | Freq: Four times a day (QID) | ORAL | Status: DC | PRN
  Administered 2017-04-08 – 2017-04-10 (×3): 650 mg via ORAL
  Filled 2017-04-08 (×3): qty 2

## 2017-04-08 MED ORDER — SODIUM CHLORIDE 0.9 % IV SOLN
INTRAVENOUS | Status: DC
  Administered 2017-04-08 – 2017-04-09 (×3): via INTRAVENOUS

## 2017-04-08 MED ORDER — POTASSIUM CHLORIDE CRYS ER 20 MEQ PO TBCR
40.0000 meq | EXTENDED_RELEASE_TABLET | Freq: Once | ORAL | Status: AC
  Administered 2017-04-08: 40 meq via ORAL
  Filled 2017-04-08: qty 2

## 2017-04-08 MED ORDER — SODIUM CHLORIDE 0.45 % IV SOLN
INTRAVENOUS | Status: DC
  Administered 2017-04-08: 02:00:00 via INTRAVENOUS

## 2017-04-08 MED ORDER — LISINOPRIL 5 MG PO TABS
5.0000 mg | ORAL_TABLET | Freq: Every day | ORAL | Status: DC
  Administered 2017-04-08 – 2017-04-11 (×4): 5 mg via ORAL
  Filled 2017-04-08 (×4): qty 1

## 2017-04-08 MED ORDER — PREMIER PROTEIN SHAKE
11.0000 [oz_av] | Freq: Two times a day (BID) | ORAL | Status: DC
  Administered 2017-04-08 – 2017-04-11 (×5): 11 [oz_av] via ORAL
  Filled 2017-04-08 (×7): qty 325.31

## 2017-04-08 NOTE — Progress Notes (Signed)
Initial Nutrition Assessment  DOCUMENTATION CODES:   Morbid obesity  INTERVENTION:   -Provide Premier Protein BID, each supplement provides 160kcal and 30g protein.  -Provided brief nutrition education  -Provided coupons for Glucerna and Boost  -RD will continue to monitor for needs  NUTRITION DIAGNOSIS:   Inadequate oral intake related to limited prior education, poor appetite as evidenced by per patient/family report (of consuming 1 meal a day).  GOAL:   Patient will meet greater than or equal to 90% of their needs  MONITOR:   PO intake, Supplement acceptance, Labs, Weight trends, I & O's  REASON FOR ASSESSMENT:   Malnutrition Screening Tool    ASSESSMENT:   31 y.o. female with medical history significant of acute promyelocytic leukemia in remission, history of Port-A-Cath line bacteremia due to Escherichia coli, history of cholelithiasis, history of urolithiasis, collagenous vascular disease, type 2 diabetes, headache, hepatic steatosis, hypertension, morbid obesity who presented to the emergency department due to fever, chills, fatigue, left flank pain, nausea, foul-smelling urine, dysuria and urinary frequency. She was seen at [facility] and was discharged home on oral antibiotics but returned as her symptoms had not improved. In the emergency department, workup revealed urinary tract infection, pyelonephritis.  Patient in room, had just completed visit with DM coordinator. Pt with history of receiving nutrition education in the past, dating back to 2008. Pt states she knows what she needs to eat and how much but states since starting school, she will skip meals until ~5pm. Pt reports not feeling hungry most days so eating 1 meal a day is normal for her. Pt ate 100% of her lunch tray. Pt states she was feeling nauseous today which began 1-2 days ago.  Encouraged pt to eat more consistently at home. Reviewed options to have at school. Pt is interested in trying protein shakes. Will  order Premier Protein for this admission. Also provided coupons for patient to buy protein shakes after discharge.   Pt's weight is stable. Nutrition focused physical exam shows no sign of depletion of muscle mass or body fat.  Medications: Protonix tablet daily, IV Zofran PRN Labs reviewed: CBGs: 256-278 Low K  Diet Order:  Diet Carb Modified Fluid consistency: Thin; Room service appropriate? Yes  Skin:  Reviewed, no issues  Last BM:  PTA  Height:   Ht Readings from Last 1 Encounters:  04/08/17 5\' 2"  (1.575 m)    Weight:   Wt Readings from Last 1 Encounters:  04/08/17 273 lb 14.4 oz (124.2 kg)    Ideal Body Weight:  50 kg  BMI:  Body mass index is 50.1 kg/m.  Estimated Nutritional Needs:   Kcal:  0102-7253  Protein:  80-90g  Fluid:  2L/day  EDUCATION NEEDS:   Education needs addressed  Clayton Bibles, MS, RD, LDN Pager: [phone] After Hours Pager: [phone]

## 2017-04-08 NOTE — Progress Notes (Signed)
PROGRESS NOTE    RASHIDA LADOUCEUR  SWF:[id number] DOB: 1986-05-14 DOA: 04/07/2017 PCP: System, Provider Not In     Brief Narrative:  MELANY WIESMAN is a 31 y.o. female with medical history significant of acute promyelocytic leukemia in remission, history of Port-A-Cath line bacteremia due to Escherichia coli, history of cholelithiasis, history of urolithiasis, collagenous vascular disease, type 2 diabetes, headache, hepatic steatosis, hypertension, morbid obesity who presented to the emergency department due to fever, chills, fatigue, left flank pain, nausea, foul-smelling urine, dysuria and urinary frequency. She was seen at [facility] and was discharged home on oral antibiotics but returned as her symptoms had not improved. In the emergency department, workup revealed urinary tract infection, pyelonephritis.  Assessment & Plan:   Principal Problem:   Acute pyelonephritis Active Problems:   DM (diabetes mellitus), type 2, uncontrolled (HCC)   HYPERTENSION, BENIGN ESSENTIAL   Anxiety associated with depression  Sepsis secondary to acute pyelonephritis -Continue ceftriaxone -Blood cultures pending -Urine culture pending -IV fluids  Diabetes type 2, uncontrolled with hyperglycemia -Patient not compliant with DM medications as outpatient  -Restart lantus  -Check Ha1c  -Continue sliding scale insulin -Consult DM coordinator   Essential hypertension -Resume lisinopril -BP stable this morning   Hypokalemia -Replace   Morbid obesity -Body mass index is 50.1 kg/m.   DVT prophylaxis: lovenox Code Status: full Family Communication: no family at bedside Disposition Plan: pending improvement    Consultants:   None   Procedures:   None   Antimicrobials:  Anti-infectives    Start     Dose/Rate Route Frequency Ordered Stop   04/08/17 1400  cefTRIAXone (ROCEPHIN) 2 g in dextrose 5 % 50 mL IVPB     2 g 100 mL/hr over 30 Minutes Intravenous Every 24 hours 04/07/17 2006     04/07/17 2030  cefTRIAXone (ROCEPHIN) 1 g in dextrose 5 % 50 mL IVPB     1 g 100 mL/hr over 30 Minutes Intravenous  Once 04/07/17 2028 04/07/17 2142   04/07/17 2000  cefTRIAXone (ROCEPHIN) 2 g in dextrose 5 % 50 mL IVPB  Status:  Discontinued     2 g 100 mL/hr over 30 Minutes Intravenous  Once 04/07/17 1952 04/07/17 2011       Subjective: Continues to have left-sided abdominal and left flank pain. This has been slowly improving. Continues to have nausea without vomiting.  Objective: Vitals:   04/07/17 2145 04/07/17 2200 04/08/17 0038 04/08/17 0534  BP: (!) 139/93 130/75 132/90 111/79  Pulse: (!) 112 (!) 112 (!) 104 85  Resp: (!) 24 (!) 24 16 20   Temp:   99.7 F (37.6 C) 97.8 F (36.6 C)  TempSrc:   Oral Oral  SpO2: 97% 98% 97% 98%  Weight:   124.2 kg (273 lb 14.4 oz)   Height:   5\' 2"  (1.575 m)     Intake/Output Summary (Last 24 hours) at 04/08/17 1126 Last data filed at 04/08/17 0650  Gross per 24 hour  Intake          4885.83 ml  Output              300 ml  Net          4585.83 ml   Filed Weights   04/07/17 1906 04/08/17 0038  Weight: 117.9 kg (260 lb) 124.2 kg (273 lb 14.4 oz)    Examination:  General exam: Appears calm and comfortable  Respiratory system: Clear to auscultation. Respiratory effort normal. Cardiovascular system: S1 &  S2 heard, RRR. No JVD, murmurs, rubs, gallops or clicks. No pedal edema. Gastrointestinal system: Abdomen is nondistended, soft and TTP Left abdomen with radiation to flank. No organomegaly or masses felt. Normal bowel sounds heard. Central nervous system: Alert and oriented. No focal neurological deficits. Extremities: Symmetric 5 x 5 power. Skin: No rashes, lesions or ulcers Psychiatry: Judgement and insight appear normal. Mood & affect appropriate.   Data Reviewed: I have personally reviewed following labs and imaging studies  CBC:  Recent Labs Lab 04/07/17 1051 04/07/17 2053 04/08/17 0102  WBC 9.2 11.9* 11.2*  NEUTROABS   --  9.8* 8.6*  HGB 13.1 12.2 10.8*  HCT 38.0 35.2* 31.0*  MCV 82.6 82.8 80.9  PLT 231 226 623   Basic Metabolic Panel:  Recent Labs Lab 04/07/17 1051 04/07/17 2053 04/08/17 0102  NA 134* 138 137  K 3.9 3.8 3.4*  CL 100* 101 106  CO2 22 25 21*  GLUCOSE 293* 230* 235*  BUN 10 16 13   CREATININE 0.63 0.88 0.73  CALCIUM 9.5 9.2 7.9*   GFR: Estimated Creatinine Clearance: 131.7 mL/min (by C-G formula based on SCr of 0.73 mg/dL). Liver Function Tests:  Recent Labs Lab 04/07/17 1051 04/07/17 2053  AST 20 35  ALT 25 30  ALKPHOS 68 70  BILITOT 1.3* 1.2  PROT 7.6 8.0  ALBUMIN 4.2 4.2    Recent Labs Lab 04/07/17 1051  LIPASE 41   No results for input(s): AMMONIA in the last 168 hours. Coagulation Profile: No results for input(s): INR, PROTIME in the last 168 hours. Cardiac Enzymes: No results for input(s): CKTOTAL, CKMB, CKMBINDEX, TROPONINI in the last 168 hours. BNP (last 3 results) No results for input(s): PROBNP in the last 8760 hours. HbA1C: No results for input(s): HGBA1C in the last 72 hours. CBG:  Recent Labs Lab 04/07/17 1053 04/08/17 0047 04/08/17 0749  GLUCAP 279* 216* 278*   Lipid Profile: No results for input(s): CHOL, HDL, LDLCALC, TRIG, CHOLHDL, LDLDIRECT in the last 72 hours. Thyroid Function Tests: No results for input(s): TSH, T4TOTAL, FREET4, T3FREE, THYROIDAB in the last 72 hours. Anemia Panel: No results for input(s): VITAMINB12, FOLATE, FERRITIN, TIBC, IRON, RETICCTPCT in the last 72 hours. Sepsis Labs:  Recent Labs Lab 04/07/17 2108 04/08/17 0007  LATICACIDVEN 2.53* 0.81    No results found for this or any previous visit (from the past 240 hour(s)).     Radiology Studies: Dg Chest 2 View  Result Date: 04/07/2017 CLINICAL DATA:  Chest pain EXAM: CHEST  2 VIEW COMPARISON:  09/11/2016 FINDINGS: The heart size and mediastinal contours are within normal limits. Both lungs are clear. The visualized skeletal structures are  unremarkable. IMPRESSION: No active cardiopulmonary disease. Electronically Signed   By: Franchot Gallo M.D.   On: 04/07/2017 20:22   Ct Renal Stone Study  Result Date: 04/07/2017 CLINICAL DATA:  Abdominal and left flank pain EXAM: CT ABDOMEN AND PELVIS WITHOUT CONTRAST TECHNIQUE: Multidetector CT imaging of the abdomen and pelvis was performed following the standard protocol without IV contrast. COMPARISON:  01/31/2017 FINDINGS: Lower chest:  No contributory findings. Hepatobiliary: Hepatic steatosis. No focal lesion is noted.Cholecystectomy with normal common bile duct diameter. Pancreas: Unremarkable. Spleen: Unremarkable. Adrenals/Urinary Tract: Negative adrenals. No hydronephrosis or stone. No left renal enlargement or perinephric edema. Unremarkable bladder. Stomach/Bowel: No obstruction. No evidence of inflammation. There is a small focus of calcification posterior to the colon at the descending sigmoid junction, chronic and incidental -question remote epiploic appendagitis. No noted diverticula. Vascular/Lymphatic: No  acute vascular abnormality. No mass or adenopathy. Reproductive:IUD in good position. Other: No ascites or pneumoperitoneum. Musculoskeletal: No acute abnormalities. IMPRESSION: 1. No acute finding or change from prior. 2. Hepatic steatosis. Electronically Signed   By: Monte Fantasia M.D.   On: 04/07/2017 13:11      Scheduled Meds: . enoxaparin (LOVENOX) injection  60 mg Subcutaneous Q24H  . insulin aspart  0-15 Units Subcutaneous TID WC  . insulin glargine  20 Units Subcutaneous QHS  . lisinopril  5 mg Oral Daily  . pantoprazole  40 mg Oral Daily  . potassium chloride  40 mEq Oral Once   Continuous Infusions: . sodium chloride    . cefTRIAXone (ROCEPHIN)  IV       LOS: 1 day    Time spent: 40 minutes   Dessa Phi, DO [facility] [email or web address] Password [email or web address] 04/08/2017, 11:26 AM

## 2017-04-08 NOTE — Care Management Note (Signed)
Case Management Note  Patient Details  Name: Melissa Washington MRN: [id number] Date of Birth: March 15, 1986  Subjective/Objective: 31 y/o f admitted w/Acute pyelonephritis. From home.                   Action/Plan:d/c plan home.   Expected Discharge Date:   (unknown)               Expected Discharge Plan:  Home/Self Care  In-House Referral:     Discharge planning Services  CM Consult  Post Acute Care Choice:    Choice offered to:     DME Arranged:    DME Agency:     HH Arranged:    HH Agency:     Status of Service:  In process, will continue to follow  If discussed at Long Length of Stay Meetings, dates discussed:    Additional Comments:  Dessa Phi, RN 04/08/2017, 2:56 PM

## 2017-04-08 NOTE — Progress Notes (Signed)
Inpatient Diabetes Program Recommendations  AACE/ADA: New Consensus Statement on Inpatient Glycemic Control (2015)  Target Ranges:  Prepandial:   less than 140 mg/dL      Peak postprandial:   less than 180 mg/dL (1-2 hours)      Critically ill patients:  140 - 180 mg/dL   Lab Results  Component Value Date   GLUCAP 256 (H) 04/08/2017   HGBA1C 10.0 03/29/2015    Review of Glycemic Control  Diabetes history: DM2 Outpatient Diabetes medications: None. Previously on Lantus / Levemir 20 units QHS, metformin 1000 mg bid Current orders for Inpatient glycemic control: Lantus 20 units QHS, Novolog 0-15 units tidwc  HgbA1C pending.  Spoke with pt at length about her diabetes. Pt states she had GDM several years ago. Last HgbA1C was 10.0%. Previously went to [facility] although goes to [place] now. Pt has not taken meds (insulin and metformin) recently because "there was a problem with the pharmacy and MD orders." States she tried to contact MD, but never received a f/u message. Pt appears angry about her not being able to get her insulin. Stressed importance of controlling blood sugars to prevent long-term complications. Will also need glucose meter and supplies at discharge. Pt prefers to f/u with [facility] PCP.  Will monitor blood sugars for any needed insulin adjustments.  Thank you. Lorenda Peck, RD, LDN, CDE Inpatient Diabetes Coordinator [phone]

## 2017-04-09 ENCOUNTER — Inpatient Hospital Stay (HOSPITAL_COMMUNITY): Payer: Medicare HMO

## 2017-04-09 LAB — CBC WITH DIFFERENTIAL/PLATELET
Basophils Absolute: 0 10*3/uL (ref 0.0–0.1)
Basophils Relative: 0 %
EOS ABS: 0.1 10*3/uL (ref 0.0–0.7)
Eosinophils Relative: 1 %
HEMATOCRIT: 30.3 % — AB (ref 36.0–46.0)
HEMOGLOBIN: 10.3 g/dL — AB (ref 12.0–15.0)
LYMPHS ABS: 1.6 10*3/uL (ref 0.7–4.0)
LYMPHS PCT: 18 %
MCH: 28.1 pg (ref 26.0–34.0)
MCHC: 34 g/dL (ref 30.0–36.0)
MCV: 82.6 fL (ref 78.0–100.0)
Monocytes Absolute: 0.7 10*3/uL (ref 0.1–1.0)
Monocytes Relative: 9 %
NEUTROS ABS: 6.2 10*3/uL (ref 1.7–7.7)
NEUTROS PCT: 72 %
Platelets: 192 10*3/uL (ref 150–400)
RBC: 3.67 MIL/uL — AB (ref 3.87–5.11)
RDW: 13 % (ref 11.5–15.5)
WBC: 8.6 10*3/uL (ref 4.0–10.5)

## 2017-04-09 LAB — GLUCOSE, CAPILLARY
GLUCOSE-CAPILLARY: 190 mg/dL — AB (ref 65–99)
GLUCOSE-CAPILLARY: 184 mg/dL — AB (ref 65–99)
GLUCOSE-CAPILLARY: 276 mg/dL — AB (ref 65–99)
Glucose-Capillary: 232 mg/dL — ABNORMAL HIGH (ref 65–99)
Glucose-Capillary: 167 mg/dL — ABNORMAL HIGH (ref 65–99)

## 2017-04-09 LAB — BASIC METABOLIC PANEL
ANION GAP: 8 (ref 5–15)
BUN: 10 mg/dL (ref 6–20)
CHLORIDE: 104 mmol/L (ref 101–111)
CO2: 25 mmol/L (ref 22–32)
Calcium: 8.6 mg/dL — ABNORMAL LOW (ref 8.9–10.3)
Creatinine, Ser: 0.67 mg/dL (ref 0.44–1.00)
GFR calc non Af Amer: >60 mL/min (ref >60)
Glucose, Bld: 249 mg/dL — ABNORMAL HIGH (ref 65–99)
POTASSIUM: 3.9 mmol/L (ref 3.5–5.1)
Sodium: 137 mmol/L (ref 135–145)

## 2017-04-09 LAB — HEMOGLOBIN A1C
Hgb A1c MFr Bld: 10.1 % — ABNORMAL HIGH (ref 4.8–5.6)
Mean Plasma Glucose: 243 mg/dL

## 2017-04-09 LAB — URINE CULTURE: Culture: >=100000 — AB

## 2017-04-09 LAB — LIPASE, BLOOD: LIPASE: 37 U/L (ref 11–51)

## 2017-04-09 MED ORDER — CEPHALEXIN 500 MG PO CAPS
500.0000 mg | ORAL_CAPSULE | Freq: Three times a day (TID) | ORAL | Status: DC
  Administered 2017-04-09 – 2017-04-10 (×3): 500 mg via ORAL
  Filled 2017-04-09 (×3): qty 1

## 2017-04-09 MED ORDER — IOPAMIDOL (ISOVUE-300) INJECTION 61%
INTRAVENOUS | Status: AC
  Administered 2017-04-09: 30 mL
  Filled 2017-04-09: qty 30

## 2017-04-09 MED ORDER — IOPAMIDOL (ISOVUE-300) INJECTION 61%
INTRAVENOUS | Status: AC
  Filled 2017-04-09: qty 100

## 2017-04-09 MED ORDER — MORPHINE SULFATE (PF) 4 MG/ML IV SOLN
4.0000 mg | INTRAVENOUS | Status: DC | PRN
  Administered 2017-04-09: 4 mg via INTRAVENOUS
  Filled 2017-04-09 (×2): qty 1

## 2017-04-09 MED ORDER — IOPAMIDOL (ISOVUE-300) INJECTION 61%
100.0000 mL | Freq: Once | INTRAVENOUS | Status: AC | PRN
  Administered 2017-04-09: 100 mL via INTRAVENOUS

## 2017-04-09 MED ORDER — IOPAMIDOL (ISOVUE-300) INJECTION 61%: 15.0000 mL | Freq: Two times a day (BID) | INTRAVENOUS | Status: DC | PRN

## 2017-04-09 MED ORDER — INSULIN ASPART 100 UNIT/ML ~~LOC~~ SOLN
0.0000 [IU] | Freq: Three times a day (TID) | SUBCUTANEOUS | Status: DC
  Administered 2017-04-09: 11 [IU] via SUBCUTANEOUS
  Administered 2017-04-09: 7 [IU] via SUBCUTANEOUS
  Administered 2017-04-09 – 2017-04-10 (×2): 4 [IU] via SUBCUTANEOUS
  Administered 2017-04-10: 7 [IU] via SUBCUTANEOUS
  Administered 2017-04-10 – 2017-04-11 (×2): 4 [IU] via SUBCUTANEOUS

## 2017-04-09 MED ORDER — INSULIN GLARGINE 100 UNIT/ML ~~LOC~~ SOLN
25.0000 [IU] | Freq: Every day | SUBCUTANEOUS | Status: DC
  Administered 2017-04-09: 25 [IU] via SUBCUTANEOUS
  Filled 2017-04-09: qty 0.25

## 2017-04-09 MED ORDER — INSULIN ASPART 100 UNIT/ML ~~LOC~~ SOLN
0.0000 [IU] | Freq: Every day | SUBCUTANEOUS | Status: DC
  Administered 2017-04-10: 2 [IU] via SUBCUTANEOUS

## 2017-04-09 NOTE — Progress Notes (Addendum)
PROGRESS NOTE    Melissa Washington  JSE:[id number] DOB: 04/22/1986 DOA: 04/07/2017 PCP: System, Provider Not In     Brief Narrative:  Melissa Washington is a 31 y.o. female with medical history significant of acute promyelocytic leukemia in remission, history of Port-A-Cath line bacteremia due to Escherichia coli, history of cholelithiasis, history of urolithiasis, collagenous vascular disease, type 2 diabetes, headache, hepatic steatosis, hypertension, morbid obesity who presented to the emergency department due to fever, chills, fatigue, left flank pain, nausea, foul-smelling urine, dysuria and urinary frequency. She was seen at [facility] and was discharged home on oral antibiotics but returned as her symptoms had not improved. In the emergency department, workup revealed urinary tract infection, pyelonephritis.  Assessment & Plan:   Principal Problem:   Acute pyelonephritis Active Problems:   DM (diabetes mellitus), type 2, uncontrolled (HCC)   HYPERTENSION, BENIGN ESSENTIAL   Anxiety associated with depression  Sepsis secondary to acute pyelonephritis, E Coli UTI, pansensitive, POA  -Blood cultures negative to date  -Urine culture with pansensitive E Coli UTI, switch antibiotics to Keflex today -Fever yesterday evening. Monitor 1 more day   RUQ abdominal pain -S/p cholecystectomy in 2013 -Check AXR this morning: unremarkable for acute pathology  -CT abd/pelvis: right pyelonephritis   Diabetes type 2, uncontrolled with hyperglycemia -Patient not compliant with DM medications as outpatient  -Ha1c 10.1  -Restart lantus and SSI  -Consult DM coordinator   Essential hypertension -Resume lisinopril -BP stable this morning   Morbid obesity -Body mass index is 50.1 kg/m.   DVT prophylaxis: lovenox Code Status: full Family Communication: family at bedside Disposition Plan: pending improvement, home 7/21 if abdominal pain improved    Consultants:   None   Procedures:   None    Antimicrobials:  Anti-infectives    Start     Dose/Rate Route Frequency Ordered Stop   04/09/17 1400  cephALEXin (KEFLEX) capsule 500 mg     500 mg Oral Every 8 hours 04/09/17 1134     04/08/17 1400  cefTRIAXone (ROCEPHIN) 2 g in dextrose 5 % 50 mL IVPB  Status:  Discontinued     2 g 100 mL/hr over 30 Minutes Intravenous Every 24 hours 04/07/17 2006 04/09/17 1134   04/07/17 2030  cefTRIAXone (ROCEPHIN) 1 g in dextrose 5 % 50 mL IVPB     1 g 100 mL/hr over 30 Minutes Intravenous  Once 04/07/17 2028 04/07/17 2142   04/07/17 2000  cefTRIAXone (ROCEPHIN) 2 g in dextrose 5 % 50 mL IVPB  Status:  Discontinued     2 g 100 mL/hr over 30 Minutes Intravenous  Once 04/07/17 1952 04/07/17 2011       Subjective: States that she is having severe right-sided abdominal pain. She states that it is difficult for her to stand up straight due to pain. Describes it as sharp pain. Some nausea but no vomiting. Urinating well, normal bowel movements.  Objective: Vitals:   04/08/17 1700 04/08/17 2109 04/08/17 2300 04/09/17 0450  BP: 92/63 135/71  108/72  Pulse: 99 (!) 104 88 80  Resp: (!) 24 (!) 22 20 20   Temp: 99.1 F (37.3 C) (!) 101.7 F (38.7 C) 99.6 F (37.6 C) (!) 97.5 F (36.4 C)  TempSrc: Oral Oral Oral Oral  SpO2: 95% 95%  95%  Weight:      Height:        Intake/Output Summary (Last 24 hours) at 04/09/17 1139 Last data filed at 04/09/17 0630  Gross per 24 hour  Intake          2511.67 ml  Output             2150 ml  Net           361.67 ml   Filed Weights   04/07/17 1906 04/08/17 0038  Weight: 117.9 kg (260 lb) 124.2 kg (273 lb 14.4 oz)    Examination:  General exam: Appears calm and comfortable  Respiratory system: Clear to auscultation. Respiratory effort normal. Cardiovascular system: S1 & S2 heard, RRR. No JVD, murmurs, rubs, gallops or clicks. No pedal edema. Gastrointestinal system: Abdomen is nondistended, soft and TTP right abdomen. No organomegaly or masses felt.  Normal bowel sounds heard. Central nervous system: Alert and oriented. No focal neurological deficits. Extremities: Symmetric 5 x 5 power. Skin: No rashes, lesions or ulcers Psychiatry: Judgement and insight appear normal. Mood & affect appropriate.   Data Reviewed: I have personally reviewed following labs and imaging studies  CBC:  Recent Labs Lab 04/07/17 1051 04/07/17 2053 04/08/17 0102 04/09/17 0501  WBC 9.2 11.9* 11.2* 8.6  NEUTROABS  --  9.8* 8.6* 6.2  HGB 13.1 12.2 10.8* 10.3*  HCT 38.0 35.2* 31.0* 30.3*  MCV 82.6 82.8 80.9 82.6  PLT 231 226 179 161   Basic Metabolic Panel:  Recent Labs Lab 04/07/17 1051 04/07/17 2053 04/08/17 0102 04/09/17 0501  NA 134* 138 137 137  K 3.9 3.8 3.4* 3.9  CL 100* 101 106 104  CO2 22 25 21* 25  GLUCOSE 293* 230* 235* 249*  BUN 10 16 13 10   CREATININE 0.63 0.88 0.73 0.67  CALCIUM 9.5 9.2 7.9* 8.6*   GFR: Estimated Creatinine Clearance: 131.7 mL/min (by C-G formula based on SCr of 0.67 mg/dL). Liver Function Tests:  Recent Labs Lab 04/07/17 1051 04/07/17 2053  AST 20 35  ALT 25 30  ALKPHOS 68 70  BILITOT 1.3* 1.2  PROT 7.6 8.0  ALBUMIN 4.2 4.2    Recent Labs Lab 04/07/17 1051  LIPASE 41   No results for input(s): AMMONIA in the last 168 hours. Coagulation Profile: No results for input(s): INR, PROTIME in the last 168 hours. Cardiac Enzymes: No results for input(s): CKTOTAL, CKMB, CKMBINDEX, TROPONINI in the last 168 hours. BNP (last 3 results) No results for input(s): PROBNP in the last 8760 hours. HbA1C:  Recent Labs  04/08/17 1147  HGBA1C 10.1*   CBG:  Recent Labs Lab 04/08/17 0749 04/08/17 1133 04/08/17 1708 04/08/17 2116 04/09/17 0801  GLUCAP 278* 256* 208* 190* 184*   Lipid Profile: No results for input(s): CHOL, HDL, LDLCALC, TRIG, CHOLHDL, LDLDIRECT in the last 72 hours. Thyroid Function Tests: No results for input(s): TSH, T4TOTAL, FREET4, T3FREE, THYROIDAB in the last 72  hours. Anemia Panel: No results for input(s): VITAMINB12, FOLATE, FERRITIN, TIBC, IRON, RETICCTPCT in the last 72 hours. Sepsis Labs:  Recent Labs Lab 04/07/17 2108 04/08/17 0007  LATICACIDVEN 2.53* 0.81    Recent Results (from the past 240 hour(s))  Urine culture     Status: Abnormal   Collection Time: 04/07/17 11:25 AM  Result Value Ref Range Status   Specimen Description URINE, RANDOM  Final   Special Requests NONE  Final   Culture >=100,000 COLONIES/mL ESCHERICHIA COLI (A)  Final   Report Status 04/09/2017 FINAL  Final   Organism ID, Bacteria ESCHERICHIA COLI (A)  Final      Susceptibility   Escherichia coli - MIC*    AMPICILLIN 4 SENSITIVE Sensitive  CEFAZOLIN <=4 SENSITIVE Sensitive     CEFTRIAXONE <=1 SENSITIVE Sensitive     CIPROFLOXACIN <=0.25 SENSITIVE Sensitive     GENTAMICIN <=1 SENSITIVE Sensitive     IMIPENEM <=0.25 SENSITIVE Sensitive     NITROFURANTOIN <=16 SENSITIVE Sensitive     TRIMETH/SULFA <=20 SENSITIVE Sensitive     AMPICILLIN/SULBACTAM <=2 SENSITIVE Sensitive     PIP/TAZO <=4 SENSITIVE Sensitive     Extended ESBL NEGATIVE Sensitive     * >=100,000 COLONIES/mL ESCHERICHIA COLI  Blood Culture (routine x 2)     Status: None (Preliminary result)   Collection Time: 04/07/17  8:53 PM  Result Value Ref Range Status   Specimen Description BLOOD BLOOD RIGHT FOREARM  Final   Special Requests   Final    BOTTLES DRAWN AEROBIC AND ANAEROBIC Blood Culture adequate volume   Culture   Final    NO GROWTH 1 DAY Performed at [facility], [place] [address] [place], [place] [address]    Report Status PENDING  Incomplete  Blood Culture (routine x 2)     Status: None (Preliminary result)   Collection Time: 04/08/17  1:02 AM  Result Value Ref Range Status   Specimen Description BLOOD RIGHT ANTECUBITAL  Final   Special Requests   Final    BOTTLES DRAWN AEROBIC AND ANAEROBIC Blood Culture adequate volume   Culture   Final    NO GROWTH < 24 HOURS Performed  at [facility], [place] [address] [place], [place] [address]    Report Status PENDING  Incomplete       Radiology Studies: Dg Chest 2 View  Result Date: 04/07/2017 CLINICAL DATA:  Chest pain EXAM: CHEST  2 VIEW COMPARISON:  09/11/2016 FINDINGS: The heart size and mediastinal contours are within normal limits. Both lungs are clear. The visualized skeletal structures are unremarkable. IMPRESSION: No active cardiopulmonary disease. Electronically Signed   By: Franchot Gallo M.D.   On: 04/07/2017 20:22   Dg Abd 1 View  Result Date: 04/09/2017 CLINICAL DATA:  Left-sided abdominal pain. EXAM: ABDOMEN - 1 VIEW COMPARISON:  CT 04/07/2017 . FINDINGS: Soft tissue structures are unremarkable. Nondistended air-filled loops of small bowel noted. This is nonspecific finding. Stool noted throughout the colon. No free air. No pathologic intra-abdominal calcifications noted. No acute bony abnormality . IMPRESSION: No acute abnormality identified. Electronically Signed   By: Marcello Moores  Register   On: 04/09/2017 10:06   Ct Renal Stone Study  Result Date: 04/07/2017 CLINICAL DATA:  Abdominal and left flank pain EXAM: CT ABDOMEN AND PELVIS WITHOUT CONTRAST TECHNIQUE: Multidetector CT imaging of the abdomen and pelvis was performed following the standard protocol without IV contrast. COMPARISON:  01/31/2017 FINDINGS: Lower chest:  No contributory findings. Hepatobiliary: Hepatic steatosis. No focal lesion is noted.Cholecystectomy with normal common bile duct diameter. Pancreas: Unremarkable. Spleen: Unremarkable. Adrenals/Urinary Tract: Negative adrenals. No hydronephrosis or stone. No left renal enlargement or perinephric edema. Unremarkable bladder. Stomach/Bowel: No obstruction. No evidence of inflammation. There is a small focus of calcification posterior to the colon at the descending sigmoid junction, chronic and incidental -question remote epiploic appendagitis. No noted diverticula. Vascular/Lymphatic: No  acute vascular abnormality. No mass or adenopathy. Reproductive:IUD in good position. Other: No ascites or pneumoperitoneum. Musculoskeletal: No acute abnormalities. IMPRESSION: 1. No acute finding or change from prior. 2. Hepatic steatosis. Electronically Signed   By: Monte Fantasia M.D.   On: 04/07/2017 13:11      Scheduled Meds: . cephALEXin  500 mg Oral Q8H  .  enoxaparin (LOVENOX) injection  60 mg Subcutaneous Q24H  . insulin aspart  0-20 Units Subcutaneous TID WC  . insulin aspart  0-5 Units Subcutaneous QHS  . insulin glargine  25 Units Subcutaneous QHS  . lisinopril  5 mg Oral Daily  . pantoprazole  40 mg Oral Daily  . protein supplement shake  11 oz Oral BID BM   Continuous Infusions:    LOS: 2 days    Time spent: 30 minutes   Dessa Phi, DO [facility] [email or web address] Password [facility] 04/09/2017, 11:39 AM

## 2017-04-09 NOTE — Progress Notes (Signed)
Inpatient Diabetes Program Recommendations  AACE/ADA: New Consensus Statement on Inpatient Glycemic Control (2015)  Target Ranges:  Prepandial:   less than 140 mg/dL      Peak postprandial:   less than 180 mg/dL (1-2 hours)      Critically ill patients:  140 - 180 mg/dL   Lab Results  Component Value Date   GLUCAP 184 (H) 04/09/2017   HGBA1C 10.1 (H) 04/08/2017    Review of Glycemic Control  FBS > 180 mg/dL. Adjust basal insulin.  Inpatient Diabetes Program Recommendations:    Increase Lantus to 24 units QHS.  Case manager assistance with obtaining meds at pharmacy.   Continue to follow.  Thank you. Lorenda Peck, RD, LDN, CDE Inpatient Diabetes Coordinator [phone]

## 2017-04-10 ENCOUNTER — Telehealth: Payer: Self-pay

## 2017-04-10 LAB — CBC WITH DIFFERENTIAL/PLATELET
BASOS ABS: 0 10*3/uL (ref 0.0–0.1)
BASOS PCT: 0 %
EOS ABS: 0.1 10*3/uL (ref 0.0–0.7)
EOS PCT: 1 %
HCT: 30.3 % — ABNORMAL LOW (ref 36.0–46.0)
HEMOGLOBIN: 10.5 g/dL — AB (ref 12.0–15.0)
Lymphocytes Relative: 26 %
Lymphs Abs: 2.2 10*3/uL (ref 0.7–4.0)
MCH: 28.4 pg (ref 26.0–34.0)
MCHC: 34.7 g/dL (ref 30.0–36.0)
MCV: 81.9 fL (ref 78.0–100.0)
Monocytes Absolute: 0.7 10*3/uL (ref 0.1–1.0)
Monocytes Relative: 8 %
NEUTROS PCT: 65 %
Neutro Abs: 5.5 10*3/uL (ref 1.7–7.7)
PLATELETS: 206 10*3/uL (ref 150–400)
RBC: 3.7 MIL/uL — AB (ref 3.87–5.11)
RDW: 12.9 % (ref 11.5–15.5)
WBC: 8.5 10*3/uL (ref 4.0–10.5)

## 2017-04-10 LAB — BASIC METABOLIC PANEL
Anion gap: 10 (ref 5–15)
BUN: 11 mg/dL (ref 6–20)
CHLORIDE: 102 mmol/L (ref 101–111)
CO2: 25 mmol/L (ref 22–32)
CREATININE: 0.66 mg/dL (ref 0.44–1.00)
Calcium: 9 mg/dL (ref 8.9–10.3)
Glucose, Bld: 282 mg/dL — ABNORMAL HIGH (ref 65–99)
Potassium: 3.9 mmol/L (ref 3.5–5.1)
SODIUM: 137 mmol/L (ref 135–145)

## 2017-04-10 LAB — GLUCOSE, CAPILLARY
GLUCOSE-CAPILLARY: 200 mg/dL — AB (ref 65–99)
GLUCOSE-CAPILLARY: 215 mg/dL — AB (ref 65–99)
Glucose-Capillary: 235 mg/dL — ABNORMAL HIGH (ref 65–99)
Glucose-Capillary: 187 mg/dL — ABNORMAL HIGH (ref 65–99)

## 2017-04-10 MED ORDER — INSULIN GLARGINE 100 UNIT/ML ~~LOC~~ SOLN
30.0000 [IU] | Freq: Every day | SUBCUTANEOUS | Status: DC
  Administered 2017-04-10: 30 [IU] via SUBCUTANEOUS
  Filled 2017-04-10: qty 0.3

## 2017-04-10 MED ORDER — SULFAMETHOXAZOLE-TRIMETHOPRIM 800-160 MG PO TABS
1.0000 | ORAL_TABLET | Freq: Two times a day (BID) | ORAL | Status: DC
  Administered 2017-04-10 – 2017-04-11 (×3): 1 via ORAL
  Filled 2017-04-10 (×3): qty 1

## 2017-04-10 MED ORDER — INSULIN ASPART 100 UNIT/ML ~~LOC~~ SOLN
3.0000 [IU] | Freq: Three times a day (TID) | SUBCUTANEOUS | Status: DC
  Administered 2017-04-10 – 2017-04-11 (×3): 3 [IU] via SUBCUTANEOUS

## 2017-04-10 NOTE — Progress Notes (Addendum)
PROGRESS NOTE    Melissa Washington  YOV:[id number] DOB: 03-01-1986 DOA: 04/07/2017 PCP: System, Provider Not In     Brief Narrative:  Melissa Washington is a 31 y.o. female with medical history significant of acute promyelocytic leukemia in remission, history of Port-A-Cath line bacteremia due to Escherichia coli, history of cholelithiasis, history of urolithiasis, collagenous vascular disease, type 2 diabetes, headache, hepatic steatosis, hypertension, morbid obesity who presented to the emergency department due to fever, chills, fatigue, left flank pain, nausea, foul-smelling urine, dysuria and urinary frequency. She was seen at [facility] and was discharged home on oral antibiotics but returned as her symptoms had not improved. In the emergency department, workup revealed urinary tract infection, pyelonephritis.  Assessment & Plan:   Principal Problem:   Acute pyelonephritis Active Problems:   DM (diabetes mellitus), type 2, uncontrolled (HCC)   HYPERTENSION, BENIGN ESSENTIAL   Anxiety associated with depression  Sepsis secondary to acute pyelonephritis, E Coli UTI, pansensitive, POA  -Blood cultures negative to date  -Urine culture with pansensitive E Coli UTI, switch antibiotics to Bactrim  -Fever yesterday evening again. Due to worsening RLQ abdominal pain, CT abd/pelvis was obtained yesterday which showed right pyelonephritis   Diabetes type 2, uncontrolled with hyperglycemia -Patient not compliant with DM medications as outpatient  -Ha1c 10.1  -Restart lantus and SSI, increase dose and add mealtime coverage today  -Consult DM coordinator   Essential hypertension -Resume lisinopril -BP stable this morning   Morbid obesity -Body mass index is 50.1 kg/m.   DVT prophylaxis: lovenox Code Status: full Family Communication: family at bedside Disposition Plan: pending improvement, home 7/22 if abdominal pain improved    Consultants:   None   Procedures:   None    Antimicrobials:  Anti-infectives    Start     Dose/Rate Route Frequency Ordered Stop   04/10/17 1000  sulfamethoxazole-trimethoprim (BACTRIM DS,SEPTRA DS) 800-160 MG per tablet 1 tablet     1 tablet Oral Every 12 hours 04/10/17 0939     04/09/17 1400  cephALEXin (KEFLEX) capsule 500 mg  Status:  Discontinued     500 mg Oral Every 8 hours 04/09/17 1134 04/10/17 0939   04/08/17 1400  cefTRIAXone (ROCEPHIN) 2 g in dextrose 5 % 50 mL IVPB  Status:  Discontinued     2 g 100 mL/hr over 30 Minutes Intravenous Every 24 hours 04/07/17 2006 04/09/17 1134   04/07/17 2030  cefTRIAXone (ROCEPHIN) 1 g in dextrose 5 % 50 mL IVPB     1 g 100 mL/hr over 30 Minutes Intravenous  Once 04/07/17 2028 04/07/17 2142   04/07/17 2000  cefTRIAXone (ROCEPHIN) 2 g in dextrose 5 % 50 mL IVPB  Status:  Discontinued     2 g 100 mL/hr over 30 Minutes Intravenous  Once 04/07/17 1952 04/07/17 2011       Subjective: Continues to have right sided abdominal pain. Tolerating meals, no issues with BM or dysuria.   Objective: Vitals:   04/09/17 1822 04/09/17 1933 04/09/17 2059 04/10/17 0535  BP:   122/78 127/85  Pulse:   91 84  Resp:   18 18  Temp: (!) 101.4 F (38.6 C) 99.3 F (37.4 C) 99.1 F (37.3 C) 98.1 F (36.7 C)  TempSrc:   Oral Oral  SpO2:   97% 97%  Weight:      Height:        Intake/Output Summary (Last 24 hours) at 04/10/17 0940 Last data filed at 04/10/17 0300  Gross per  24 hour  Intake              800 ml  Output             1700 ml  Net             -900 ml   Filed Weights   04/07/17 1906 04/08/17 0038  Weight: 117.9 kg (260 lb) 124.2 kg (273 lb 14.4 oz)    Examination:  General exam: Appears calm and comfortable  Respiratory system: Clear to auscultation. Respiratory effort normal. Cardiovascular system: S1 & S2 heard, RRR. No JVD, murmurs, rubs, gallops or clicks. No pedal edema. Gastrointestinal system: Abdomen is nondistended, soft and TTP right abdomen. No organomegaly or masses  felt. Normal bowel sounds heard. Central nervous system: Alert and oriented. No focal neurological deficits. Extremities: Symmetric 5 x 5 power. Skin: No rashes, lesions or ulcers Psychiatry: Judgement and insight appear normal. Mood & affect appropriate.   Data Reviewed: I have personally reviewed following labs and imaging studies  CBC:  Recent Labs Lab 04/07/17 1051 04/07/17 2053 04/08/17 0102 04/09/17 0501 04/10/17 0418  WBC 9.2 11.9* 11.2* 8.6 8.5  NEUTROABS  --  9.8* 8.6* 6.2 5.5  HGB 13.1 12.2 10.8* 10.3* 10.5*  HCT 38.0 35.2* 31.0* 30.3* 30.3*  MCV 82.6 82.8 80.9 82.6 81.9  PLT 231 226 179 192 160   Basic Metabolic Panel:  Recent Labs Lab 04/07/17 1051 04/07/17 2053 04/08/17 0102 04/09/17 0501 04/10/17 0418  NA 134* 138 137 137 137  K 3.9 3.8 3.4* 3.9 3.9  CL 100* 101 106 104 102  CO2 22 25 21* 25 25  GLUCOSE 293* 230* 235* 249* 282*  BUN 10 16 13 10 11   CREATININE 0.63 0.88 0.73 0.67 0.66  CALCIUM 9.5 9.2 7.9* 8.6* 9.0   GFR: Estimated Creatinine Clearance: 131.7 mL/min (by C-G formula based on SCr of 0.66 mg/dL). Liver Function Tests:  Recent Labs Lab 04/07/17 1051 04/07/17 2053  AST 20 35  ALT 25 30  ALKPHOS 68 70  BILITOT 1.3* 1.2  PROT 7.6 8.0  ALBUMIN 4.2 4.2    Recent Labs Lab 04/07/17 1051 04/09/17 1437  LIPASE 41 37   No results for input(s): AMMONIA in the last 168 hours. Coagulation Profile: No results for input(s): INR, PROTIME in the last 168 hours. Cardiac Enzymes: No results for input(s): CKTOTAL, CKMB, CKMBINDEX, TROPONINI in the last 168 hours. BNP (last 3 results) No results for input(s): PROBNP in the last 8760 hours. HbA1C:  Recent Labs  04/08/17 1147  HGBA1C 10.1*   CBG:  Recent Labs Lab 04/09/17 0801 04/09/17 1208 04/09/17 1654 04/09/17 2055 04/10/17 0824  GLUCAP 184* 276* 232* 167* 200*   Lipid Profile: No results for input(s): CHOL, HDL, LDLCALC, TRIG, CHOLHDL, LDLDIRECT in the last 72  hours. Thyroid Function Tests: No results for input(s): TSH, T4TOTAL, FREET4, T3FREE, THYROIDAB in the last 72 hours. Anemia Panel: No results for input(s): VITAMINB12, FOLATE, FERRITIN, TIBC, IRON, RETICCTPCT in the last 72 hours. Sepsis Labs:  Recent Labs Lab 04/07/17 2108 04/08/17 0007  LATICACIDVEN 2.53* 0.81    Recent Results (from the past 240 hour(s))  Urine culture     Status: Abnormal   Collection Time: 04/07/17 11:25 AM  Result Value Ref Range Status   Specimen Description URINE, RANDOM  Final   Special Requests NONE  Final   Culture >=100,000 COLONIES/mL ESCHERICHIA COLI (A)  Final   Report Status 04/09/2017 FINAL  Final   Organism  ID, Bacteria ESCHERICHIA COLI (A)  Final      Susceptibility   Escherichia coli - MIC*    AMPICILLIN 4 SENSITIVE Sensitive     CEFAZOLIN <=4 SENSITIVE Sensitive     CEFTRIAXONE <=1 SENSITIVE Sensitive     CIPROFLOXACIN <=0.25 SENSITIVE Sensitive     GENTAMICIN <=1 SENSITIVE Sensitive     IMIPENEM <=0.25 SENSITIVE Sensitive     NITROFURANTOIN <=16 SENSITIVE Sensitive     TRIMETH/SULFA <=20 SENSITIVE Sensitive     AMPICILLIN/SULBACTAM <=2 SENSITIVE Sensitive     PIP/TAZO <=4 SENSITIVE Sensitive     Extended ESBL NEGATIVE Sensitive     * >=100,000 COLONIES/mL ESCHERICHIA COLI  Blood Culture (routine x 2)     Status: None (Preliminary result)   Collection Time: 04/07/17  8:53 PM  Result Value Ref Range Status   Specimen Description BLOOD BLOOD RIGHT FOREARM  Final   Special Requests   Final    BOTTLES DRAWN AEROBIC AND ANAEROBIC Blood Culture adequate volume   Culture   Final    NO GROWTH 1 DAY Performed at [facility], [address] [place], [place] [address]    Report Status PENDING  Incomplete  Blood Culture (routine x 2)     Status: None (Preliminary result)   Collection Time: 04/08/17  1:02 AM  Result Value Ref Range Status   Specimen Description BLOOD RIGHT ANTECUBITAL  Final   Special Requests   Final    BOTTLES  DRAWN AEROBIC AND ANAEROBIC Blood Culture adequate volume   Culture   Final    NO GROWTH < 24 HOURS Performed at [facility], [place] [address] [place], [place] [address]    Report Status PENDING  Incomplete       Radiology Studies: Dg Abd 1 View  Result Date: 04/09/2017 CLINICAL DATA:  Left-sided abdominal pain. EXAM: ABDOMEN - 1 VIEW COMPARISON:  CT 04/07/2017 . FINDINGS: Soft tissue structures are unremarkable. Nondistended air-filled loops of small bowel noted. This is nonspecific finding. Stool noted throughout the colon. No free air. No pathologic intra-abdominal calcifications noted. No acute bony abnormality . IMPRESSION: No acute abnormality identified. Electronically Signed   By: Marcello Moores  Register   On: 04/09/2017 10:06   Ct Abdomen Pelvis W Contrast  Result Date: 04/09/2017 CLINICAL DATA:  Acute promyelocytic leukemia in remission, presents with fever, chills, fatigue, LEFT flank pain, foul-smelling urine, dysuria, urinary frequency, nausea, no improvement on antibiotics ; workup reveals urinary tract infection and pyelonephritis. Increased RIGHT side pain greater than LEFT over past couple days; additional history of kidney stones, hypertension, type II diabetes mellitus, collagen vascular disease EXAM: CT ABDOMEN AND PELVIS WITH CONTRAST TECHNIQUE: Multidetector CT imaging of the abdomen and pelvis was performed using the standard protocol following bolus administration of intravenous contrast. Sagittal and coronal MPR images reconstructed from axial data set. CONTRAST:  177mL ISOVUE-300 IOPAMIDOL (ISOVUE-300) INJECTION 61% IV. Dilute oral contrast. COMPARISON:  04/07/2017 FINDINGS: Lower chest: Subsegmental atelectasis LEFT lower lobe, new Hepatobiliary: Gallbladder surgically absent. Fatty infiltration of liver. No biliary dilatation. Pancreas: Normal appearance Spleen: Normal appearance Adrenals/Urinary Tract: Adrenal glands unremarkable. Patchy RIGHT nephrogram with increased  perinephric edema consistent with pyelonephritis. Stomach/Bowel: Appendix not identified. Stomach and bowel loops normal appearance. Vascular/Lymphatic: Vascular structures normal appearance. Few scattered normal sized mesenteric lymph nodes. Reproductive: IUD within uterus. Uterus and ovaries otherwise unremarkable. Other: No free air free fluid. No hernia or acute inflammatory process. Musculoskeletal: No acute osseous findings. IMPRESSION: Patchy RIGHT nephrogram with  increased RIGHT perinephric edema consistent with pyelonephritis. Mild fatty infiltration of liver. No additional intraabdominal or intrapelvic abnormalities. Electronically Signed   By: Lavonia Dana M.D.   On: 04/09/2017 17:49      Scheduled Meds: . enoxaparin (LOVENOX) injection  60 mg Subcutaneous Q24H  . insulin aspart  0-20 Units Subcutaneous TID WC  . insulin aspart  0-5 Units Subcutaneous QHS  . insulin aspart  3 Units Subcutaneous TID WC  . insulin glargine  30 Units Subcutaneous QHS  . lisinopril  5 mg Oral Daily  . pantoprazole  40 mg Oral Daily  . protein supplement shake  11 oz Oral BID BM  . sulfamethoxazole-trimethoprim  1 tablet Oral Q12H   Continuous Infusions:    LOS: 3 days    Time spent: 30 minutes   Dessa Phi, DO [facility] [email or web address] Password [email or web address] 04/10/2017, 9:40 AM

## 2017-04-10 NOTE — Telephone Encounter (Signed)
Post ED Visit - Positive Culture Follow-up  Culture report reviewed by antimicrobial stewardship pharmacist:  []  Elenor Quinones, Pharm.D. []  Heide Guile, Pharm.D., BCPS AQ-ID [x]  Parks Neptune, Pharm.D., BCPS []  Alycia Rossetti, Pharm.D., BCPS []  [place], Pharm.D., BCPS, AAHIVP []  Legrand Como, Pharm.D., BCPS, AAHIVP []  Salome Arnt, PharmD, BCPS []  Dimitri Ped, PharmD, BCPS []  Vincenza Hews, PharmD, BCPS  Positive urine culture Treated with Amoxicillin, organism sensitive to the same and no further patient follow-up is required at this time.  Genia Del 04/10/2017, 11:30 AM

## 2017-04-11 LAB — BASIC METABOLIC PANEL
Anion gap: 10 (ref 5–15)
BUN: 14 mg/dL (ref 6–20)
CO2: 27 mmol/L (ref 22–32)
CREATININE: 0.68 mg/dL (ref 0.44–1.00)
Calcium: 9.4 mg/dL (ref 8.9–10.3)
Chloride: 102 mmol/L (ref 101–111)
GFR calc Af Amer: >60 mL/min (ref >60)
GLUCOSE: 180 mg/dL — AB (ref 65–99)
Potassium: 4 mmol/L (ref 3.5–5.1)
SODIUM: 139 mmol/L (ref 135–145)

## 2017-04-11 LAB — CBC WITH DIFFERENTIAL/PLATELET
Basophils Absolute: 0 10*3/uL (ref 0.0–0.1)
Basophils Relative: 0 %
EOS ABS: 0.1 10*3/uL (ref 0.0–0.7)
EOS PCT: 2 %
HCT: 30.4 % — ABNORMAL LOW (ref 36.0–46.0)
Hemoglobin: 10.6 g/dL — ABNORMAL LOW (ref 12.0–15.0)
LYMPHS ABS: 2.2 10*3/uL (ref 0.7–4.0)
Lymphocytes Relative: 29 %
MCH: 28.6 pg (ref 26.0–34.0)
MCHC: 34.9 g/dL (ref 30.0–36.0)
MCV: 81.9 fL (ref 78.0–100.0)
MONO ABS: 0.5 10*3/uL (ref 0.1–1.0)
Monocytes Relative: 7 %
Neutro Abs: 4.8 10*3/uL (ref 1.7–7.7)
Neutrophils Relative %: 62 %
PLATELETS: 242 10*3/uL (ref 150–400)
RBC: 3.71 MIL/uL — AB (ref 3.87–5.11)
RDW: 13 % (ref 11.5–15.5)
WBC: 7.6 10*3/uL (ref 4.0–10.5)

## 2017-04-11 LAB — GLUCOSE, CAPILLARY: GLUCOSE-CAPILLARY: 175 mg/dL — AB (ref 65–99)

## 2017-04-11 MED ORDER — SULFAMETHOXAZOLE-TRIMETHOPRIM 800-160 MG PO TABS: 1.0000 | ORAL_TABLET | Freq: Two times a day (BID) | ORAL | 0 refills | Status: AC

## 2017-04-11 MED ORDER — BLOOD GLUCOSE METER KIT: PACK | 0 refills | Status: AC

## 2017-04-11 MED ORDER — ONDANSETRON 4 MG PO TBDP: 4.0000 mg | ORAL_TABLET | Freq: Three times a day (TID) | ORAL | 0 refills | Status: AC | PRN

## 2017-04-11 MED ORDER — LISINOPRIL 5 MG PO TABS: 5.0000 mg | ORAL_TABLET | Freq: Every day | ORAL | 0 refills | Status: DC

## 2017-04-11 MED ORDER — IBUPROFEN 800 MG PO TABS: 800.0000 mg | ORAL_TABLET | Freq: Four times a day (QID) | ORAL | 0 refills | Status: DC | PRN

## 2017-04-11 MED ORDER — INSULIN GLARGINE 100 UNIT/ML SOLOSTAR PEN: 30.0000 [IU] | PEN_INJECTOR | Freq: Every day | SUBCUTANEOUS | 0 refills | Status: AC

## 2017-04-11 MED ORDER — INSULIN ASPART 100 UNIT/ML FLEXPEN: PEN_INJECTOR | SUBCUTANEOUS | 0 refills | Status: DC

## 2017-04-11 MED ORDER — ACETAMINOPHEN 325 MG PO TABS: 650.0000 mg | ORAL_TABLET | Freq: Four times a day (QID) | ORAL | 0 refills | Status: DC | PRN

## 2017-04-11 MED ORDER — METFORMIN HCL 1000 MG PO TABS: 1000.0000 mg | ORAL_TABLET | Freq: Two times a day (BID) | ORAL | 0 refills | Status: DC

## 2017-04-11 MED ORDER — INSULIN PEN NEEDLE 31G X 5 MM MISC: 0 refills | Status: AC

## 2017-04-11 NOTE — Progress Notes (Signed)
Pt given discharge teaching and medication teaching. Pt verbalized understanding of all discharge teaching and instructions. VSS Pt's assessment is without changes at time of discharge.  Pt discharged with dc [place]

## 2017-04-11 NOTE — Discharge Summary (Signed)
Physician Discharge Summary  MAUI AHART GDJ:[id number] DOB: Oct 16, 1985 DOA: 04/07/2017  PCP: System, Provider Not In, Follows with [facility]  Admit date: 04/07/2017 Discharge date: 04/11/2017  Admitted From: Home Disposition:  Home  Recommendations for Outpatient Follow-up:  1. Follow up with PCP in 1 week 2. Please follow up on the following pending results: final blood culture results   Discharge Condition: Stable CODE STATUS: Full  Diet recommendation: Heart healthy   Brief/Interim Summary: Melissa Washington a 31 y.o.femalewith medical history significant of acute promyelocytic leukemia in remission, history of Port-A-Cath line bacteremia due to Escherichia coli, history of cholelithiasis, history of urolithiasis, collagenous vascular disease, type 2 diabetes, headache, hepatic steatosis, hypertension, morbid obesity who presented to the emergency department due to fever, chills, fatigue, left flank pain, nausea, foul-smelling urine, dysuria and urinary frequency. She was seen at [facility] and was discharged home on oral antibiotics but returned as her symptoms had not improved. In the emergency department, workup revealed urinary tract infection, pyelonephritis.  Sepsis secondary to acute pyelonephritis, E Coli UTI, pansensitive, POA  -Blood cultures negative to date  -Urine culture with pansensitive E Coli UTI, switch antibiotics to Bactrim  -CT abd/pelvis showed right pyelonephritis  -Afebrile last 24 hours and pain has improved   Diabetes type 2, uncontrolled with hyperglycemia -Patient not compliant with DM medications as outpatient  -Ha1c 10.1  -Restart lantus and SSI, prescriptions provided at discharge with supplies + metformin  -Appreciate DM coordinator   Essential hypertension -Lisinopril -BP stable this morning   Morbid obesity -Body mass index is 50.1 kg/m.   Discharge Instructions  Discharge Instructions    Call MD for:  difficulty breathing,  headache or visual disturbances    Complete by:  As directed    Call MD for:  extreme fatigue    Complete by:  As directed    Call MD for:  hives    Complete by:  As directed    Call MD for:  persistant dizziness or light-headedness    Complete by:  As directed    Call MD for:  persistant nausea and vomiting    Complete by:  As directed    Call MD for:  severe uncontrolled pain    Complete by:  As directed    Call MD for:  temperature >100.4    Complete by:  As directed    Diet Carb Modified    Complete by:  As directed    Discharge instructions    Complete by:  As directed    You were cared for by a hospitalist during your hospital stay. If you have any questions about your discharge medications or the care you received while you were in the hospital after you are discharged, you can call the unit and asked to speak with the hospitalist on call if the hospitalist that took care of you is not available. Once you are discharged, your primary care physician will handle any further medical issues. Please note that NO REFILLS for any discharge medications will be authorized once you are discharged, as it is imperative that you return to your primary care physician (or establish a relationship with a primary care physician if you do not have one) for your aftercare needs so that they can reassess your need for medications and monitor your lab values.   Increase activity slowly    Complete by:  As directed      Allergies as of 04/11/2017      Reactions  Ultram [tramadol Hcl] Nausea And Vomiting      Medication List    STOP taking these medications   ACCU-CHEK FASTCLIX LANCETS Misc   amoxicillin 875 MG tablet Commonly known as:  AMOXIL   amoxicillin-clavulanate 875-125 MG tablet Commonly known as:  AUGMENTIN   etodolac 500 MG tablet Commonly known as:  LODINE   glucose blood test strip Commonly known as:  ACCU-CHEK SMARTVIEW   HYDROcodone-acetaminophen 5-325 MG tablet Commonly  known as:  NORCO   insulin detemir 100 UNIT/ML injection Commonly known as:  LEVEMIR   INSULIN SYRINGE 1CC/31GX5/16" 31G X 5/16" 1 ML Misc   meloxicam 7.5 MG tablet Commonly known as:  MOBIC   mupirocin ointment 2 % Commonly known as:  BACTROBAN   omeprazole 20 MG capsule Commonly known as:  PRILOSEC   promethazine 25 MG tablet Commonly known as:  PHENERGAN   topiramate 25 MG tablet Commonly known as:  TOPAMAX     TAKE these medications   acetaminophen 325 MG tablet Commonly known as:  TYLENOL Take 2 tablets (650 mg total) by mouth every 6 (six) hours as needed for mild pain, moderate pain or fever (fever > 100.4).   blood glucose meter kit and supplies Dispense based on patient and insurance preference. Use up to four times daily as directed. (FOR ICD-9 250.00, 250.01). What changed:  medication strength  additional instructions   ibuprofen 800 MG tablet Commonly known as:  ADVIL,MOTRIN Take 1 tablet (800 mg total) by mouth every 6 (six) hours as needed for fever, headache, mild pain, moderate pain or cramping (fever > 100.4, alternate with tylenol).   insulin aspart 100 UNIT/ML FlexPen Commonly known as:  NOVOLOG FLEXPEN 3 units with meals PLUS sliding scale:  CBG 121 - 150: 3 units CBG 151 - 200: 4 units CBG 201 - 250: 7 units CBG 251 - 300: 11 units CBG 301 - 350: 15 units CBG 351 - 400: 20 units   Insulin Glargine 100 UNIT/ML Solostar Pen Commonly known as:  LANTUS Inject 30 Units into the skin daily at 10 pm. What changed:  how much to take   Insulin Pen Needle 31G X 5 MM Misc Use with insulin, 4 times daily What changed:  medication strength  additional instructions   levonorgestrel 20 MCG/24HR IUD Commonly known as:  MIRENA 1 each by Intrauterine route continuous.   lisinopril 5 MG tablet Commonly known as:  PRINIVIL,ZESTRIL Take 1 tablet (5 mg total) by mouth daily.   metFORMIN 1000 MG tablet Commonly known as:  GLUCOPHAGE Take 1 tablet  (1,000 mg total) by mouth 2 (two) times daily with a meal.   ondansetron 4 MG disintegrating tablet Commonly known as:  ZOFRAN ODT Take 1 tablet (4 mg total) by mouth every 8 (eight) hours as needed for nausea or vomiting.   sulfamethoxazole-trimethoprim 800-160 MG tablet Commonly known as:  BACTRIM DS,SEPTRA DS Take 1 tablet by mouth every 12 (twelve) hours.      Follow-up Information    Your PCP. Schedule an appointment as soon as possible for a visit in 1 week(s).   Contact information: [facility]         Allergies  Allergen Reactions  . Ultram [Tramadol Hcl] Nausea And Vomiting    Consultations:  None   Procedures/Studies: Dg Chest 2 View  Result Date: 04/07/2017 CLINICAL DATA:  Chest pain EXAM: CHEST  2 VIEW COMPARISON:  09/11/2016 FINDINGS: The heart size and mediastinal contours are within normal limits. Both lungs  are clear. The visualized skeletal structures are unremarkable. IMPRESSION: No active cardiopulmonary disease. Electronically Signed   By: Franchot Gallo M.D.   On: 04/07/2017 20:22   Dg Abd 1 View  Result Date: 04/09/2017 CLINICAL DATA:  Left-sided abdominal pain. EXAM: ABDOMEN - 1 VIEW COMPARISON:  CT 04/07/2017 . FINDINGS: Soft tissue structures are unremarkable. Nondistended air-filled loops of small bowel noted. This is nonspecific finding. Stool noted throughout the colon. No free air. No pathologic intra-abdominal calcifications noted. No acute bony abnormality . IMPRESSION: No acute abnormality identified. Electronically Signed   By: Marcello Moores  Register   On: 04/09/2017 10:06   Ct Abdomen Pelvis W Contrast  Result Date: 04/09/2017 CLINICAL DATA:  Acute promyelocytic leukemia in remission, presents with fever, chills, fatigue, LEFT flank pain, foul-smelling urine, dysuria, urinary frequency, nausea, no improvement on antibiotics ; workup reveals urinary tract infection and pyelonephritis. Increased RIGHT side pain greater than LEFT over past couple  days; additional history of kidney stones, hypertension, type II diabetes mellitus, collagen vascular disease EXAM: CT ABDOMEN AND PELVIS WITH CONTRAST TECHNIQUE: Multidetector CT imaging of the abdomen and pelvis was performed using the standard protocol following bolus administration of intravenous contrast. Sagittal and coronal MPR images reconstructed from axial data set. CONTRAST:  159m ISOVUE-300 IOPAMIDOL (ISOVUE-300) INJECTION 61% IV. Dilute oral contrast. COMPARISON:  04/07/2017 FINDINGS: Lower chest: Subsegmental atelectasis LEFT lower lobe, new Hepatobiliary: Gallbladder surgically absent. Fatty infiltration of liver. No biliary dilatation. Pancreas: Normal appearance Spleen: Normal appearance Adrenals/Urinary Tract: Adrenal glands unremarkable. Patchy RIGHT nephrogram with increased perinephric edema consistent with pyelonephritis. Stomach/Bowel: Appendix not identified. Stomach and bowel loops normal appearance. Vascular/Lymphatic: Vascular structures normal appearance. Few scattered normal sized mesenteric lymph nodes. Reproductive: IUD within uterus. Uterus and ovaries otherwise unremarkable. Other: No free air free fluid. No hernia or acute inflammatory process. Musculoskeletal: No acute osseous findings. IMPRESSION: Patchy RIGHT nephrogram with increased RIGHT perinephric edema consistent with pyelonephritis. Mild fatty infiltration of liver. No additional intraabdominal or intrapelvic abnormalities. Electronically Signed   By: MLavonia DanaM.D.   On: 04/09/2017 17:49   Ct Renal Stone Study  Result Date: 04/07/2017 CLINICAL DATA:  Abdominal and left flank pain EXAM: CT ABDOMEN AND PELVIS WITHOUT CONTRAST TECHNIQUE: Multidetector CT imaging of the abdomen and pelvis was performed following the standard protocol without IV contrast. COMPARISON:  01/31/2017 FINDINGS: Lower chest:  No contributory findings. Hepatobiliary: Hepatic steatosis. No focal lesion is noted.Cholecystectomy with normal common  bile duct diameter. Pancreas: Unremarkable. Spleen: Unremarkable. Adrenals/Urinary Tract: Negative adrenals. No hydronephrosis or stone. No left renal enlargement or perinephric edema. Unremarkable bladder. Stomach/Bowel: No obstruction. No evidence of inflammation. There is a small focus of calcification posterior to the colon at the descending sigmoid junction, chronic and incidental -question remote epiploic appendagitis. No noted diverticula. Vascular/Lymphatic: No acute vascular abnormality. No mass or adenopathy. Reproductive:IUD in good position. Other: No ascites or pneumoperitoneum. Musculoskeletal: No acute abnormalities. IMPRESSION: 1. No acute finding or change from prior. 2. Hepatic steatosis. Electronically Signed   By: JMonte FantasiaM.D.   On: 04/07/2017 13:11   Xr Hips Bilat W Or W/o Pelvis 2v  Result Date: 03/22/2017 An AP pelvis and lateral both hips show no significant arthritic changes at all. The hips well located with well-maintained joint space of both hips. There is no acute findings.      Discharge Exam: Vitals:   04/10/17 2041 04/11/17 0504  BP: 135/88 107/72  Pulse: 84 84  Resp: 18 17  Temp: 98.8 F (37.1  C) 97.9 F (36.6 C)   Vitals:   04/10/17 1314 04/10/17 1700 04/10/17 2041 04/11/17 0504  BP: (!) 139/101 112/67 135/88 107/72  Pulse: 87 87 84 84  Resp: '18 18 18 17  ' Temp: 98.5 F (36.9 C)  98.8 F (37.1 C) 97.9 F (36.6 C)  TempSrc: Oral  Oral Oral  SpO2: 100%  97% 94%  Weight:      Height:        General: Pt is alert, awake, not in acute distress Cardiovascular: RRR, S1/S2 +, no rubs, no gallops Respiratory: CTA bilaterally, no wheezing, no rhonchi Abdominal: Soft, NT, ND, bowel sounds + Extremities: no edema, no cyanosis    The results of significant diagnostics from this hospitalization (including imaging, microbiology, ancillary and laboratory) are listed below for reference.     Microbiology: Recent Results (from the past 240 hour(s))   Urine culture     Status: Abnormal   Collection Time: 04/07/17 11:25 AM  Result Value Ref Range Status   Specimen Description URINE, RANDOM  Final   Special Requests NONE  Final   Culture >=100,000 COLONIES/mL ESCHERICHIA COLI (A)  Final   Report Status 04/09/2017 FINAL  Final   Organism ID, Bacteria ESCHERICHIA COLI (A)  Final      Susceptibility   Escherichia coli - MIC*    AMPICILLIN 4 SENSITIVE Sensitive     CEFAZOLIN <=4 SENSITIVE Sensitive     CEFTRIAXONE <=1 SENSITIVE Sensitive     CIPROFLOXACIN <=0.25 SENSITIVE Sensitive     GENTAMICIN <=1 SENSITIVE Sensitive     IMIPENEM <=0.25 SENSITIVE Sensitive     NITROFURANTOIN <=16 SENSITIVE Sensitive     TRIMETH/SULFA <=20 SENSITIVE Sensitive     AMPICILLIN/SULBACTAM <=2 SENSITIVE Sensitive     PIP/TAZO <=4 SENSITIVE Sensitive     Extended ESBL NEGATIVE Sensitive     * >=100,000 COLONIES/mL ESCHERICHIA COLI  Blood Culture (routine x 2)     Status: None (Preliminary result)   Collection Time: 04/07/17  8:53 PM  Result Value Ref Range Status   Specimen Description BLOOD BLOOD RIGHT FOREARM  Final   Special Requests   Final    BOTTLES DRAWN AEROBIC AND ANAEROBIC Blood Culture adequate volume   Culture   Final    NO GROWTH 2 DAYS Performed at [facility], [address] [place], [place] [address]    Report Status PENDING  Incomplete  Blood Culture (routine x 2)     Status: None (Preliminary result)   Collection Time: 04/08/17  1:02 AM  Result Value Ref Range Status   Specimen Description BLOOD RIGHT ANTECUBITAL  Final   Special Requests   Final    BOTTLES DRAWN AEROBIC AND ANAEROBIC Blood Culture adequate volume   Culture   Final    NO GROWTH 2 DAYS Performed at [facility], [place] [address] [place], [place] [address]    Report Status PENDING  Incomplete     Labs: BNP (last 3 results) No results for input(s): BNP in the last 8760 hours. Basic Metabolic Panel:  Recent Labs Lab 04/07/17 2053  04/08/17 0102 04/09/17 0501 04/10/17 0418 04/11/17 0720  NA 138 137 137 137 139  K 3.8 3.4* 3.9 3.9 4.0  CL 101 106 104 102 102  CO2 25 21* '25 25 27  ' GLUCOSE 230* 235* 249* 282* 180*  BUN '16 13 10 11 14  ' CREATININE 0.88 0.73 0.67 0.66 0.68  CALCIUM 9.2 7.9* 8.6* 9.0 9.4   Liver  Function Tests:  Recent Labs Lab 04/07/17 1051 04/07/17 2053  AST 20 35  ALT 25 30  ALKPHOS 68 70  BILITOT 1.3* 1.2  PROT 7.6 8.0  ALBUMIN 4.2 4.2    Recent Labs Lab 04/07/17 1051 04/09/17 1437  LIPASE 41 37   No results for input(s): AMMONIA in the last 168 hours. CBC:  Recent Labs Lab 04/07/17 2053 04/08/17 0102 04/09/17 0501 04/10/17 0418 04/11/17 0720  WBC 11.9* 11.2* 8.6 8.5 7.6  NEUTROABS 9.8* 8.6* 6.2 5.5 4.8  HGB 12.2 10.8* 10.3* 10.5* 10.6*  HCT 35.2* 31.0* 30.3* 30.3* 30.4*  MCV 82.8 80.9 82.6 81.9 81.9  PLT 226 179 192 206 242   Cardiac Enzymes: No results for input(s): CKTOTAL, CKMB, CKMBINDEX, TROPONINI in the last 168 hours. BNP: Invalid input(s): POCBNP CBG:  Recent Labs Lab 04/10/17 0824 04/10/17 1212 04/10/17 1613 04/10/17 2120 04/11/17 0804  GLUCAP 200* 235* 187* 215* 175*   D-Dimer No results for input(s): DDIMER in the last 72 hours. Hgb A1c  Recent Labs  04/08/17 1147  HGBA1C 10.1*   Lipid Profile No results for input(s): CHOL, HDL, LDLCALC, TRIG, CHOLHDL, LDLDIRECT in the last 72 hours. Thyroid function studies No results for input(s): TSH, T4TOTAL, T3FREE, THYROIDAB in the last 72 hours.  Invalid input(s): FREET3 Anemia work up No results for input(s): VITAMINB12, FOLATE, FERRITIN, TIBC, IRON, RETICCTPCT in the last 72 hours. Urinalysis    Component Value Date/Time   COLORURINE YELLOW 04/07/2017 1112   APPEARANCEUR CLOUDY (A) 04/07/2017 1112   LABSPEC 1.012 04/07/2017 1112   PHURINE 5.0 04/07/2017 1112   GLUCOSEU >=500 (A) 04/07/2017 1112   HGBUR SMALL (A) 04/07/2017 1112   HGBUR moderate 01/29/2009 1517   BILIRUBINUR NEGATIVE  04/07/2017 1112   BILIRUBINUR NEG 01/19/2013 1123   KETONESUR 5 (A) 04/07/2017 1112   PROTEINUR 30 (A) 04/07/2017 1112   UROBILINOGEN 0.2 05/01/2015 0020   NITRITE NEGATIVE 04/07/2017 1112   LEUKOCYTESUR MODERATE (A) 04/07/2017 1112   Sepsis Labs Invalid input(s): PROCALCITONIN,  WBC,  LACTICIDVEN Microbiology Recent Results (from the past 240 hour(s))  Urine culture     Status: Abnormal   Collection Time: 04/07/17 11:25 AM  Result Value Ref Range Status   Specimen Description URINE, RANDOM  Final   Special Requests NONE  Final   Culture >=100,000 COLONIES/mL ESCHERICHIA COLI (A)  Final   Report Status 04/09/2017 FINAL  Final   Organism ID, Bacteria ESCHERICHIA COLI (A)  Final      Susceptibility   Escherichia coli - MIC*    AMPICILLIN 4 SENSITIVE Sensitive     CEFAZOLIN <=4 SENSITIVE Sensitive     CEFTRIAXONE <=1 SENSITIVE Sensitive     CIPROFLOXACIN <=0.25 SENSITIVE Sensitive     GENTAMICIN <=1 SENSITIVE Sensitive     IMIPENEM <=0.25 SENSITIVE Sensitive     NITROFURANTOIN <=16 SENSITIVE Sensitive     TRIMETH/SULFA <=20 SENSITIVE Sensitive     AMPICILLIN/SULBACTAM <=2 SENSITIVE Sensitive     PIP/TAZO <=4 SENSITIVE Sensitive     Extended ESBL NEGATIVE Sensitive     * >=100,000 COLONIES/mL ESCHERICHIA COLI  Blood Culture (routine x 2)     Status: None (Preliminary result)   Collection Time: 04/07/17  8:53 PM  Result Value Ref Range Status   Specimen Description BLOOD BLOOD RIGHT FOREARM  Final   Special Requests   Final    BOTTLES DRAWN AEROBIC AND ANAEROBIC Blood Culture adequate volume   Culture   Final    NO GROWTH 2  DAYS Performed at [facility], [place] [address] [place], [place] [address]    Report Status PENDING  Incomplete  Blood Culture (routine x 2)     Status: None (Preliminary result)   Collection Time: 04/08/17  1:02 AM  Result Value Ref Range Status   Specimen Description BLOOD RIGHT ANTECUBITAL  Final   Special Requests   Final    BOTTLES DRAWN  AEROBIC AND ANAEROBIC Blood Culture adequate volume   Culture   Final    NO GROWTH 2 DAYS Performed at [facility], [place] [address] [place], [place] [address]    Report Status PENDING  Incomplete     Time coordinating discharge: 25 minutes  SIGNED:  Dessa Phi, DO [facility] Pager [phone]  If 7PM-7AM, please contact night-coverage [email or web address] Password [email or web address] 04/11/2017, 8:42 AM

## 2017-04-13 LAB — CULTURE, BLOOD (ROUTINE X 2)
CULTURE: NO GROWTH
CULTURE: NO GROWTH
SPECIAL REQUESTS: ADEQUATE
Special Requests: ADEQUATE

## 2017-07-07 ENCOUNTER — Encounter (HOSPITAL_COMMUNITY): Payer: Self-pay | Admitting: Family Medicine

## 2017-07-07 ENCOUNTER — Ambulatory Visit (HOSPITAL_COMMUNITY)
Admission: EM | Admit: 2017-07-07 | Discharge: 2017-07-07 | Disposition: A | Discharge status: Home / Self Care | Payer: Medicare HMO | Attending: Family Medicine | Admitting: Family Medicine

## 2017-07-07 DIAGNOSIS — L03313 Cellulitis of chest wall: Secondary | ICD-10-CM | POA: Diagnosis not present

## 2017-07-07 DIAGNOSIS — L0291 Cutaneous abscess, unspecified: Secondary | ICD-10-CM | POA: Diagnosis not present

## 2017-07-07 MED ORDER — CEPHALEXIN 500 MG PO CAPS: 500.0000 mg | ORAL_CAPSULE | Freq: Four times a day (QID) | ORAL | 0 refills | Status: DC

## 2017-07-07 NOTE — ED Triage Notes (Signed)
Pt here for abscess to left breast with minimal drainage.

## 2017-07-07 NOTE — ED Provider Notes (Signed)
[place]    CSN: [id number] Arrival date & time: 07/07/17  1055     History   Chief Complaint Chief Complaint  Patient presents with  . Abscess    HPI Melissa Washington is a 31 y.o. female.   31 year old female with history of diabetes, hypertension, leukemia in remission, comes in for 2 day history of abscess on left breast. Patient states she noticed it yesterday and has been doing warm compresses and trying to drain on own. She states she has been squeezing with some drainage. Has some surrounding erythema, denies erythema spreading. Denies increased warmth, fever. States she gets abscesses easily. Unsure of diabetes control and unknown her last a1c. She has an appointment with PCP in 1.5 weeks.       Past Medical History:  Diagnosis Date  . 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 Bacteremia associated with IV line ([place]) 05/01/2013   Port-a-cath infection, removed 8/6 by IR. Ecoli grew out in cultures. Sent home with Ceftriaxone to complete 14 day course.   . Bacteremia due to Escherichia coli 05/01/2013   Port-a-cath infection, removed 8/6 by IR. Ecoli grew out in cultures. Sent home with Ceftriaxone to complete 14 day course.   . Cholelithiasis 01/17/2012  . Collagen vascular disease ([place])   . Diabetes mellitus    type 2, insulin  . Headache(784.0)   . Hepatic steatosis 01/17/2012  . Hypertension   . Leukemia, acute monocytic, in remission ([place])   . Nephrolithiasis   . Obesity     Patient Active Problem List   Diagnosis Date Noted  . Acute pyelonephritis 04/07/2017  . Vaginitis and vulvovaginitis 10/09/2014  . Alopecia 06/26/2014  . Bilateral hip pain 06/19/2014  . Anxiety associated with depression 06/19/2014  . Allergic rhinitis 06/07/2014  . IUD complication ([place]) 02/40/9735  . Exposure to STD 06/07/2014  . Acute promyelocytic leukemia in remission ([place]) 11/19/2012  . Morbid obesity with BMI of 50.0-59.9, adult ([place])  01/20/2012  . DM (diabetes mellitus), type 2, uncontrolled ([place]) 12/30/2006  . HYPERTENSION, BENIGN ESSENTIAL 12/30/2006    Past Surgical History:  Procedure Laterality Date  . CHOLECYSTECTOMY  01/31/2012   Procedure: LAPAROSCOPIC CHOLECYSTECTOMY WITH INTRAOPERATIVE CHOLANGIOGRAM;  Surgeon: Marcello Moores A. Cornett, MD;  Location: [facility] OR;  Service: General;  Laterality: N/A;  . PORTACATH PLACEMENT     [place]    OB History    Gravida Para Term Preterm AB Living   '2 2 2 '$ 0 0 2   SAB TAB Ectopic Multiple Live Births   0 0 0 0 2       Home Medications    Prior to Admission medications   Medication Sig Start Date End Date Taking? Authorizing Provider  acetaminophen (TYLENOL) 325 MG tablet Take 2 tablets (650 mg total) by mouth every 6 (six) hours as needed for mild pain, moderate pain or fever (fever > 100.4). 04/11/17   Dessa Phi Chahn-Yang, DO  blood glucose meter kit and supplies Dispense based on patient and insurance preference. Use up to four times daily as directed. (FOR ICD-9 250.00, 250.01). 04/11/17   Dessa Phi Chahn-Yang, DO  cephALEXin (KEFLEX) 500 MG capsule Take 1 capsule (500 mg total) by mouth 4 (four) times daily. 07/07/17   Tasia Catchings, Carisma Troupe V, PA-C  ibuprofen (ADVIL,MOTRIN) 800 MG tablet Take 1 tablet (800 mg total) by mouth every 6 (six) hours as needed for fever, headache, mild pain, moderate pain or cramping (fever > 100.4, alternate with  tylenol). 04/11/17   Dessa Phi Chahn-Yang, DO  insulin aspart (NOVOLOG FLEXPEN) 100 UNIT/ML FlexPen 3 units with meals PLUS sliding scale:  CBG 121 - 150: 3 units CBG 151 - 200: 4 units CBG 201 - 250: 7 units CBG 251 - 300: 11 units CBG 301 - 350: 15 units CBG 351 - 400: 20 units 04/11/17   Dessa Phi Chahn-Yang, DO  Insulin Glargine (LANTUS) 100 UNIT/ML Solostar Pen Inject 30 Units into the skin daily at 10 pm. 04/11/17 05/11/17  Dessa Phi Chahn-Yang, DO  Insulin Pen Needle 31G X 5 MM MISC Use with insulin, 4 times daily 04/11/17    Dessa Phi Chahn-Yang, DO  levonorgestrel (MIRENA) 20 MCG/24HR IUD 1 each by Intrauterine route continuous.    [provider]  lisinopril (PRINIVIL,ZESTRIL) 5 MG tablet Take 1 tablet (5 mg total) by mouth daily. 04/11/17   Dessa Phi Chahn-Yang, DO  metFORMIN (GLUCOPHAGE) 1000 MG tablet Take 1 tablet (1,000 mg total) by mouth 2 (two) times daily with a meal. 04/11/17   Shon Millet, DO    Family History Family History  Problem Relation Age of Onset  . Kidney disease Mother   . Hypertension Mother   . Epilepsy Mother   . Sleep apnea Mother   . Diabetes Father   . Kidney disease Maternal Grandmother   . Diabetes Maternal Grandmother     Social History Social History  Substance Use Topics  . Smoking status: Former Research scientist (life sciences)  . Smokeless tobacco: Never Used  . Alcohol use No     Allergies   Ultram [tramadol hcl]   Review of Systems Review of Systems  Reason unable to perform ROS: See HPI as above.     Physical Exam Triage Vital Signs ED Triage Vitals  Enc Vitals Group     BP 07/07/17 1119 (!) 145/117     Pulse Rate 07/07/17 1117 91     Resp 07/07/17 1117 18     Temp 07/07/17 1117 98.5 F (36.9 C)     Temp src --      SpO2 07/07/17 1117 98 %     Weight --      Height --      Head Circumference --      Peak Flow --      Pain Score 07/07/17 1118 10     Pain Loc --      Pain Edu? --      Excl. in [place]? --    No data found.   Updated Vital Signs BP (!) 145/117   Pulse 91   Temp 98.5 F (36.9 C)   Resp 18   SpO2 98%   Physical Exam  Constitutional: She is oriented to person, place, and time. She appears well-developed and well-nourished. No distress.  HENT:  Head: Normocephalic and atraumatic.  Eyes: Pupils are equal, round, and reactive to light. Conjunctivae are normal.  Neurological: She is alert and oriented to person, place, and time.  Skin:  Small lesion 1 cm x 1 cm on left medial breast close to chest wall. 0.5cm  surrounding erythema with increased warmth. Hard palpation without obvious fluctuance.      UC Treatments / Results  Labs (all labs ordered are listed, but only abnormal results are displayed) Labs Reviewed - No data to display  EKG  EKG Interpretation None       Radiology No results found.  Procedures .Marland KitchenIncision and Drainage Date/Time: 07/07/2017 12:28 PM Performed by: Tasia Catchings, Anniyah Mood V  Authorized by: Vanessa Kick   Consent:    Consent obtained:  Verbal   Consent given by:  Patient   Risks discussed:  Incomplete drainage, infection, pain and bleeding   Alternatives discussed:  Alternative treatment Location:    Type:  Abscess   Size:  1cm   Location:  Trunk   Trunk location:  L breast Pre-procedure details:    Skin preparation:  Betadine Anesthesia (see MAR for exact dosages):    Anesthesia method:  Local infiltration   Local anesthetic:  Lidocaine 2% WITH epi Procedure type:    Complexity:  Simple Procedure details:    Needle aspiration: no     Incision types:  Stab incision   Incision depth:  Dermal   Scalpel blade:  11   Wound management:  Irrigated with saline   Drainage:  Serosanguinous   Drainage amount:  Scant   Wound treatment:  Wound left open   Packing materials:  None Post-procedure details:    Patient tolerance of procedure:  Tolerated well, no immediate complications   (including critical care time)  Medications Ordered in UC Medications - No data to display   Initial Impression / Assessment and Plan / UC Course  I have reviewed the triage vital signs and the nursing notes.  Pertinent labs & imaging results that were available during my care of the patient were reviewed by me and considered in my medical decision making (see chart for details).    Discussed antibiotics vs I&D given no clear fluctuance on exam. Patient would like to proceed with I&D, tolerated procedure well. Wound care instructions given. Start keflex for surrounding  cellulitis. Patient to follow up with PCP as scheduled for recheck. Return precautions given.   Final Clinical Impressions(s) / UC Diagnoses   Final diagnoses:  Abscess  Cellulitis of chest wall    New Prescriptions Discharge Medication List as of 07/07/2017 12:13 PM    START taking these medications   Details  cephALEXin (KEFLEX) 500 MG capsule Take 1 capsule (500 mg total) by mouth 4 (four) times daily., Starting Wed 07/07/2017, Normal          Cathlean Sauer V, PA-C 07/07/17 1230

## 2017-07-07 NOTE — Discharge Instructions (Signed)
Abscess drained today. Daily dressing. Keep wound clean and dry. Start Keflex for skin infection as directed. You can take tylenol/motrin for pain and fever. Monitor for any spreading redness, increased warmth, fever, follow-up for reevaluation. Follow-up with PCP as scheduled for recheck.

## 2017-07-18 ENCOUNTER — Encounter (HOSPITAL_COMMUNITY): Payer: Self-pay | Admitting: *Deleted

## 2017-07-18 ENCOUNTER — Ambulatory Visit (HOSPITAL_COMMUNITY)
Admission: EM | Admit: 2017-07-18 | Discharge: 2017-07-18 | Disposition: A | Discharge status: Home / Self Care | Payer: Medicare HMO | Attending: Internal Medicine | Admitting: Internal Medicine

## 2017-07-18 DIAGNOSIS — Z794 Long term (current) use of insulin: Secondary | ICD-10-CM | POA: Insufficient documentation

## 2017-07-18 DIAGNOSIS — Z888 Allergy status to other drugs, medicaments and biological substances status: Secondary | ICD-10-CM | POA: Insufficient documentation

## 2017-07-18 DIAGNOSIS — C9241 Acute promyelocytic leukemia, in remission: Secondary | ICD-10-CM | POA: Diagnosis not present

## 2017-07-18 DIAGNOSIS — Z9049 Acquired absence of other specified parts of digestive tract: Secondary | ICD-10-CM | POA: Insufficient documentation

## 2017-07-18 DIAGNOSIS — N3090 Cystitis, unspecified without hematuria: Secondary | ICD-10-CM

## 2017-07-18 DIAGNOSIS — N898 Other specified noninflammatory disorders of vagina: Secondary | ICD-10-CM | POA: Diagnosis present

## 2017-07-18 DIAGNOSIS — Z87442 Personal history of urinary calculi: Secondary | ICD-10-CM | POA: Diagnosis not present

## 2017-07-18 DIAGNOSIS — Z841 Family history of disorders of kidney and ureter: Secondary | ICD-10-CM | POA: Insufficient documentation

## 2017-07-18 DIAGNOSIS — Z87891 Personal history of nicotine dependence: Secondary | ICD-10-CM | POA: Diagnosis not present

## 2017-07-18 DIAGNOSIS — I10 Essential (primary) hypertension: Secondary | ICD-10-CM | POA: Insufficient documentation

## 2017-07-18 DIAGNOSIS — Z820 Family history of epilepsy and other diseases of the nervous system: Secondary | ICD-10-CM | POA: Insufficient documentation

## 2017-07-18 DIAGNOSIS — Z8249 Family history of ischemic heart disease and other diseases of the circulatory system: Secondary | ICD-10-CM | POA: Insufficient documentation

## 2017-07-18 DIAGNOSIS — Z3202 Encounter for pregnancy test, result negative: Secondary | ICD-10-CM | POA: Diagnosis not present

## 2017-07-18 DIAGNOSIS — Z833 Family history of diabetes mellitus: Secondary | ICD-10-CM | POA: Insufficient documentation

## 2017-07-18 DIAGNOSIS — Z79899 Other long term (current) drug therapy: Secondary | ICD-10-CM | POA: Insufficient documentation

## 2017-07-18 DIAGNOSIS — K760 Fatty (change of) liver, not elsewhere classified: Secondary | ICD-10-CM | POA: Insufficient documentation

## 2017-07-18 DIAGNOSIS — E119 Type 2 diabetes mellitus without complications: Secondary | ICD-10-CM | POA: Insufficient documentation

## 2017-07-18 LAB — POCT URINALYSIS DIP (DEVICE)
BILIRUBIN URINE: NEGATIVE
GLUCOSE, UA: 250 mg/dL — AB
KETONES UR: NEGATIVE mg/dL
Nitrite: NEGATIVE
PROTEIN: 100 mg/dL — AB
SPECIFIC GRAVITY, URINE: 1.025 (ref 1.005–1.030)
UROBILINOGEN UA: 0.2 mg/dL (ref 0.0–1.0)
pH: 5.5 (ref 5.0–8.0)

## 2017-07-18 LAB — POCT PREGNANCY, URINE: PREG TEST UR: NEGATIVE

## 2017-07-18 MED ORDER — SULFAMETHOXAZOLE-TRIMETHOPRIM 800-160 MG PO TABS: 1.0000 | ORAL_TABLET | Freq: Two times a day (BID) | ORAL | 0 refills | Status: AC

## 2017-07-18 MED ORDER — FLUCONAZOLE 150 MG PO TABS: 150.0000 mg | ORAL_TABLET | Freq: Every day | ORAL | 0 refills | Status: DC

## 2017-07-18 NOTE — ED Triage Notes (Signed)
Pt has been non-compliant with HTN meds; has not taken x 2 days.

## 2017-07-18 NOTE — ED Triage Notes (Addendum)
C/O vaginal itching, irritation, and white discharge x 2-3 days.  Believes to be yeast infection - gets frequently due to diabetes.  Pt recently was on abx.

## 2017-07-18 NOTE — Discharge Instructions (Signed)
Your urine was positive for an urinary tract infection. Start bactrim as directed. Keep hydrated, your urine should be clear to pale yellow in color. Monitor for any worsening of symptoms, fever, worsening abdominal pain, nausea/vomiting, flank pain, follow up for reevaluation.   You were treated empirically for yeast. Start diflucan as directed. Cytology sent, you will be contacted with any positive results that requires further treatment. Refrain from sexual activity for the next 7 days. Monitor for any worsening of symptoms, fever, abdominal pain, nausea, vomiting, to follow up for reevaluation.

## 2017-07-18 NOTE — ED Provider Notes (Signed)
[place]    CSN: [id number] Arrival date & time: 07/18/17  1220     History   Chief Complaint Chief Complaint  Patient presents with  . Vaginal Discharge    HPI Melissa Washington is a 31 y.o. female.   31 year old female with history of promyelocytic leukemia in remission, DM, HTN comes in for 2-3 day history of vaginal itching, irritation and white discharge. She experiences some dysuria, though she believes it is due to her scratching. Denies urinary frequency. Sexually active with 1 partner, without condom use. She has an IUD and gets spotting as a cycle. Currently with spotting. She states she can get yeast infections during her spotting frequently. Denies abdominal pain, nausea, vomiting. Denies fever, chills, night sweats. She was recently on abx for cellulitis/abscess.        Past Medical History:  Diagnosis Date  . 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 Bacteremia associated with IV line ([place]) 05/01/2013   Port-a-cath infection, removed 8/6 by IR. Ecoli grew out in cultures. Sent home with Ceftriaxone to complete 14 day course.   . Bacteremia due to Escherichia coli 05/01/2013   Port-a-cath infection, removed 8/6 by IR. Ecoli grew out in cultures. Sent home with Ceftriaxone to complete 14 day course.   . Cholelithiasis 01/17/2012  . Collagen vascular disease ([place])   . Diabetes mellitus    type 2, insulin  . Headache(784.0)   . Hepatic steatosis 01/17/2012  . Hypertension   . Leukemia, acute monocytic, in remission ([place])   . Nephrolithiasis   . Obesity     Patient Active Problem List   Diagnosis Date Noted  . Acute pyelonephritis 04/07/2017  . Vaginitis and vulvovaginitis 10/09/2014  . Alopecia 06/26/2014  . Bilateral hip pain 06/19/2014  . Anxiety associated with depression 06/19/2014  . Allergic rhinitis 06/07/2014  . IUD complication ([place]) 14/43/1540  . Exposure to STD 06/07/2014  . Acute promyelocytic leukemia in remission  ([place]) 11/19/2012  . Morbid obesity with BMI of 50.0-59.9, adult ([place]) 01/20/2012  . DM (diabetes mellitus), type 2, uncontrolled ([place]) 12/30/2006  . HYPERTENSION, BENIGN ESSENTIAL 12/30/2006    Past Surgical History:  Procedure Laterality Date  . CHOLECYSTECTOMY  01/31/2012   Procedure: LAPAROSCOPIC CHOLECYSTECTOMY WITH INTRAOPERATIVE CHOLANGIOGRAM;  Surgeon: Marcello Moores A. Cornett, MD;  Location: [facility];  Service: General;  Laterality: N/A;  . PORTACATH PLACEMENT     [place]  . portacath removal      OB History    Gravida Para Term Preterm AB Living   '2 2 2 ' 0 0 2   SAB TAB Ectopic Multiple Live Births   0 0 0 0 2       Home Medications    Prior to Admission medications   Medication Sig Start Date End Date Taking? Authorizing Provider  blood glucose meter kit and supplies Dispense based on patient and insurance preference. Use up to four times daily as directed. (FOR ICD-9 250.00, 250.01). 04/11/17  Yes Choi, Jennifer Chahn-Yang, DO  insulin aspart (NOVOLOG FLEXPEN) 100 UNIT/ML FlexPen 3 units with meals PLUS sliding scale:  CBG 121 - 150: 3 units CBG 151 - 200: 4 units CBG 201 - 250: 7 units CBG 251 - 300: 11 units CBG 301 - 350: 15 units CBG 351 - 400: 20 units 04/11/17  Yes Dessa Phi Chahn-Yang, DO  Insulin Glargine (LANTUS) 100 UNIT/ML Solostar Pen Inject 30 Units into the skin daily at 10 pm. 04/11/17 07/18/17  Yes Dessa Phi Chahn-Yang, DO  levonorgestrel (MIRENA) 20 MCG/24HR IUD 1 each by Intrauterine route continuous.   Yes [provider]  lisinopril (PRINIVIL,ZESTRIL) 5 MG tablet Take 1 tablet (5 mg total) by mouth daily. 04/11/17  Yes Dessa Phi Chahn-Yang, DO  metFORMIN (GLUCOPHAGE) 1000 MG tablet Take 1 tablet (1,000 mg total) by mouth 2 (two) times daily with a meal. 04/11/17  Yes Dessa Phi Chahn-Yang, DO  acetaminophen (TYLENOL) 325 MG tablet Take 2 tablets (650 mg total) by mouth every 6 (six) hours as needed for mild pain, moderate pain or fever  (fever > 100.4). 04/11/17   Dessa Phi Chahn-Yang, DO  fluconazole (DIFLUCAN) 150 MG tablet Take 1 tablet (150 mg total) by mouth daily. Take second dose 72 hours later if symptoms still persists. 07/18/17   Tasia Catchings, Dayron Odland V, PA-C  ibuprofen (ADVIL,MOTRIN) 800 MG tablet Take 1 tablet (800 mg total) by mouth every 6 (six) hours as needed for fever, headache, mild pain, moderate pain or cramping (fever > 100.4, alternate with tylenol). 04/11/17   Dessa Phi Chahn-Yang, DO  Insulin Pen Needle 31G X 5 MM MISC Use with insulin, 4 times daily 04/11/17   Dessa Phi Chahn-Yang, DO  sulfamethoxazole-trimethoprim (BACTRIM DS,SEPTRA DS) 800-160 MG tablet Take 1 tablet by mouth 2 (two) times daily. 07/18/17 07/21/17  Ok Edwards, PA-C    Family History Family History  Problem Relation Age of Onset  . Kidney disease Mother   . Hypertension Mother   . Epilepsy Mother   . Sleep apnea Mother   . Diabetes Father   . Kidney disease Maternal Grandmother   . Diabetes Maternal Grandmother     Social History Social History  Substance Use Topics  . Smoking status: Former Research scientist (life sciences)  . Smokeless tobacco: Never Used  . Alcohol use No     Allergies   Ultram [tramadol hcl]   Review of Systems Review of Systems  Reason unable to perform ROS: See HPI as above.     Physical Exam Triage Vital Signs ED Triage Vitals  Enc Vitals Group     BP 07/18/17 1328 (!) 115/99     Pulse Rate 07/18/17 1326 76     Resp 07/18/17 1326 18     Temp 07/18/17 1326 98.2 F (36.8 C)     Temp Source 07/18/17 1326 Oral     SpO2 07/18/17 1326 99 %     Weight --      Height --      Head Circumference --      Peak Flow --      Pain Score 07/18/17 1327 0     Pain Loc --      Pain Edu? --      Excl. in [place]? --    No data found.   Updated Vital Signs BP (!) 115/99   Pulse 76   Temp 98.2 F (36.8 C) (Oral)   Resp 18   SpO2 99%   Physical Exam  Constitutional: She is oriented to person, place, and time. She appears  well-developed and well-nourished. No distress.  HENT:  Head: Normocephalic and atraumatic.  Eyes: Pupils are equal, round, and reactive to light. Conjunctivae are normal.  Cardiovascular: Normal rate, regular rhythm and normal heart sounds.  Exam reveals no gallop and no friction rub.   No murmur heard. Pulmonary/Chest: Effort normal and breath sounds normal. She has no wheezes. She has no rales.  Abdominal: Soft. Bowel sounds are normal. She exhibits no mass.  There is no tenderness. There is no rebound, no guarding and no CVA tenderness.  Genitourinary:  Genitourinary Comments: Self swabbed for cytology.   Neurological: She is alert and oriented to person, place, and time.  Skin: Skin is warm and dry.  Psychiatric: She has a normal mood and affect. Her behavior is normal. Judgment normal.     UC Treatments / Results  Labs (all labs ordered are listed, but only abnormal results are displayed) Labs Reviewed  POCT URINALYSIS DIP (DEVICE) - Abnormal; Notable for the following:       Result Value   Glucose, UA 250 (*)    Hgb urine dipstick SMALL (*)    Protein, ur 100 (*)    Leukocytes, UA LARGE (*)    All other components within normal limits  URINE CULTURE  POCT PREGNANCY, URINE  CERVICOVAGINAL ANCILLARY ONLY    EKG  EKG Interpretation None       Radiology No results found.  Procedures Procedures (including critical care time)  Medications Ordered in UC Medications - No data to display   Initial Impression / Assessment and Plan / UC Course  I have reviewed the triage vital signs and the nursing notes.  Pertinent labs & imaging results that were available during my care of the patient were reviewed by me and considered in my medical decision making (see chart for details).    Urine dipstick positive for UTI. Start antibiotics as directed. Push fluids. Patient was treated empirically for yeast. Start diflucan. Cytology sent, patient will be contacted with any  positive results that require additional treatment. Patient to refrain from sexual activity for the next 7 days. Return precautions given.   Final Clinical Impressions(s) / UC Diagnoses   Final diagnoses:  Vaginal discharge  Cystitis    New Prescriptions Discharge Medication List as of 07/18/2017  2:09 PM    START taking these medications   Details  fluconazole (DIFLUCAN) 150 MG tablet Take 1 tablet (150 mg total) by mouth daily. Take second dose 72 hours later if symptoms still persists., Starting Sun 07/18/2017, Normal    sulfamethoxazole-trimethoprim (BACTRIM DS,SEPTRA DS) 800-160 MG tablet Take 1 tablet by mouth 2 (two) times daily., Starting Sun 07/18/2017, Until Wed 07/21/2017, Normal          Anijah Spohr V, PA-C 07/18/17 1421

## 2017-07-19 LAB — CERVICOVAGINAL ANCILLARY ONLY
BACTERIAL VAGINITIS: NEGATIVE
Candida vaginitis: POSITIVE — AB
Chlamydia: NEGATIVE
NEISSERIA GONORRHEA: NEGATIVE
TRICH (WINDOWPATH): NEGATIVE

## 2017-07-20 LAB — URINE CULTURE: Culture: >=100000 — AB

## 2017-07-31 ENCOUNTER — Emergency Department (HOSPITAL_COMMUNITY)
Admission: EM | Admit: 2017-07-31 | Discharge: 2017-07-31 | Disposition: A | Discharge status: Home / Self Care | Payer: Medicare HMO | Attending: Emergency Medicine | Admitting: Emergency Medicine

## 2017-07-31 DIAGNOSIS — Z794 Long term (current) use of insulin: Secondary | ICD-10-CM | POA: Insufficient documentation

## 2017-07-31 DIAGNOSIS — M545 Low back pain, unspecified: Secondary | ICD-10-CM

## 2017-07-31 DIAGNOSIS — E119 Type 2 diabetes mellitus without complications: Secondary | ICD-10-CM | POA: Insufficient documentation

## 2017-07-31 DIAGNOSIS — I10 Essential (primary) hypertension: Secondary | ICD-10-CM | POA: Diagnosis not present

## 2017-07-31 DIAGNOSIS — B3731 Acute candidiasis of vulva and vagina: Secondary | ICD-10-CM

## 2017-07-31 DIAGNOSIS — B373 Candidiasis of vulva and vagina: Secondary | ICD-10-CM | POA: Diagnosis not present

## 2017-07-31 DIAGNOSIS — Z87891 Personal history of nicotine dependence: Secondary | ICD-10-CM | POA: Diagnosis not present

## 2017-07-31 LAB — URINALYSIS, ROUTINE W REFLEX MICROSCOPIC
BILIRUBIN URINE: NEGATIVE
Glucose, UA: 150 mg/dL — AB
KETONES UR: 5 mg/dL — AB
Nitrite: NEGATIVE
PROTEIN: 100 mg/dL — AB
Specific Gravity, Urine: 1.014 (ref 1.005–1.030)
pH: 5 (ref 5.0–8.0)

## 2017-07-31 LAB — PREGNANCY, URINE: Preg Test, Ur: NEGATIVE

## 2017-07-31 LAB — CBG MONITORING, ED: GLUCOSE-CAPILLARY: 260 mg/dL — AB (ref 65–99)

## 2017-07-31 MED ORDER — FLUCONAZOLE 150 MG PO TABS: 150.0000 mg | ORAL_TABLET | Freq: Every day | ORAL | 0 refills | Status: AC

## 2017-07-31 MED ORDER — NAPROXEN 500 MG PO TABS: 500.0000 mg | ORAL_TABLET | Freq: Two times a day (BID) | ORAL | 0 refills | Status: DC

## 2017-07-31 MED ORDER — KETOROLAC TROMETHAMINE 30 MG/ML IJ SOLN
30.0000 mg | Freq: Once | INTRAMUSCULAR | Status: AC
  Administered 2017-07-31: 30 mg via INTRAMUSCULAR
  Filled 2017-07-31: qty 1

## 2017-07-31 MED ORDER — CEPHALEXIN 500 MG PO CAPS: 500.0000 mg | ORAL_CAPSULE | Freq: Four times a day (QID) | ORAL | 0 refills | Status: AC

## 2017-07-31 NOTE — ED Provider Notes (Signed)
[facility] EMERGENCY DEPARTMENT Provider Note   CSN: [id number] Arrival date & time: 07/31/17  1736     History   Chief Complaint Chief Complaint  Patient presents with  . Back Pain  . Vaginal Discharge    HPI CARLISHA Washington is a 31 y.o. female.  31 year old female presents with complaint of low back pain - right her "tailbone." Symptoms started about 4 days prior. She denies fever, injury, abdominal pain, or weakness.   She also requests diflucan for vaginal discharge - a "yeast infection." She declines getting a pelvic exam today.    The history is provided by the patient.  Back Pain   This is a new problem. The current episode started more than 2 days ago. The problem occurs every several days. The problem has not changed since onset.The pain is associated with no known injury. The pain is present in the lumbar spine. Quality: ache. The pain does not radiate. The pain is mild. The symptoms are aggravated by bending and twisting. Pertinent negatives include no chest pain and no numbness. She has tried NSAIDs for the symptoms.    Past Medical History:  Diagnosis Date  . 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 Bacteremia associated with IV line ([place]) 05/01/2013   Port-a-cath infection, removed 8/6 by IR. Ecoli grew out in cultures. Sent home with Ceftriaxone to complete 14 day course.   . Bacteremia due to Escherichia coli 05/01/2013   Port-a-cath infection, removed 8/6 by IR. Ecoli grew out in cultures. Sent home with Ceftriaxone to complete 14 day course.   . Cholelithiasis 01/17/2012  . Collagen vascular disease ([place])   . Diabetes mellitus    type 2, insulin  . Headache(784.0)   . Hepatic steatosis 01/17/2012  . Hypertension   . Leukemia, acute monocytic, in remission ([place])   . Nephrolithiasis   . Obesity     Patient Active Problem List   Diagnosis Date Noted  . Acute pyelonephritis 04/07/2017  . Vaginitis and vulvovaginitis  10/09/2014  . Alopecia 06/26/2014  . Bilateral hip pain 06/19/2014  . Anxiety associated with depression 06/19/2014  . Allergic rhinitis 06/07/2014  . IUD complication ([place]) 83/15/1761  . Exposure to STD 06/07/2014  . 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 Morbid obesity with BMI of 50.0-59.9, adult ([place]) 01/20/2012  . DM (diabetes mellitus), type 2, uncontrolled ([place]) 12/30/2006  . HYPERTENSION, BENIGN ESSENTIAL 12/30/2006    Past Surgical History:  Procedure Laterality Date  . PORTACATH PLACEMENT     [place]  . portacath removal      OB History    Gravida Para Term Preterm AB Living   '2 2 2 ' 0 0 2   SAB TAB Ectopic Multiple Live Births   0 0 0 0 2       Home Medications    Prior to Admission medications   Medication Sig Start Date End Date Taking? Authorizing Provider  acetaminophen (TYLENOL) 325 MG tablet Take 2 tablets (650 mg total) by mouth every 6 (six) hours as needed for mild pain, moderate pain or fever (fever > 100.4). 04/11/17  Yes Dessa Phi Chahn-Yang, DO  blood glucose meter kit and supplies Dispense based on patient and insurance preference. Use up to four times daily as directed. (FOR ICD-9 250.00, 250.01). 04/11/17  Yes Dessa Phi Chahn-Yang, DO  ibuprofen (ADVIL,MOTRIN) 800 MG tablet Take 1 tablet (800 mg total) by mouth every 6 (six) hours  as needed for fever, headache, mild pain, moderate pain or cramping (fever > 100.4, alternate with tylenol). 04/11/17  Yes Dessa Phi Chahn-Yang, DO  insulin aspart (NOVOLOG FLEXPEN) 100 UNIT/ML FlexPen 3 units with meals PLUS sliding scale:  CBG 121 - 150: 3 units CBG 151 - 200: 4 units CBG 201 - 250: 7 units CBG 251 - 300: 11 units CBG 301 - 350: 15 units CBG 351 - 400: 20 units 04/11/17  Yes Dessa Phi Chahn-Yang, DO  Insulin Glargine (LANTUS) 100 UNIT/ML Solostar Pen Inject 30 Units into the skin daily at 10 pm. Patient taking differently: Inject 20 Units daily at 10 pm into the skin.   04/11/17 07/31/17 Yes Dessa Phi Chahn-Yang, DO  Insulin Pen Needle 31G X 5 MM MISC Use with insulin, 4 times daily 04/11/17  Yes Dessa Phi Chahn-Yang, DO  levonorgestrel (MIRENA) 20 MCG/24HR IUD 1 each by Intrauterine route continuous.   Yes [provider]  lisinopril (PRINIVIL,ZESTRIL) 5 MG tablet Take 1 tablet (5 mg total) by mouth daily. 04/11/17  Yes Dessa Phi Chahn-Yang, DO  metFORMIN (GLUCOPHAGE) 1000 MG tablet Take 1 tablet (1,000 mg total) by mouth 2 (two) times daily with a meal. 04/11/17  Yes Shon Millet, DO    Family History Family History  Problem Relation Age of Onset  . Kidney disease Mother   . Hypertension Mother   . Epilepsy Mother   . Sleep apnea Mother   . Diabetes Father   . Kidney disease Maternal Grandmother   . Diabetes Maternal Grandmother     Social History Social History   Tobacco Use  . Smoking status: Former Research scientist (life sciences)  . Smokeless tobacco: Never Used  Substance Use Topics  . Alcohol use: No  . Drug use: No     Allergies   Ultram [tramadol hcl]   Review of Systems Review of Systems  Cardiovascular: Negative for chest pain.  Musculoskeletal: Positive for back pain.  Neurological: Negative for numbness.  All other systems reviewed and are negative.    Physical Exam Updated Vital Signs BP 114/75   Pulse 84   Temp 98.2 F (36.8 C) (Oral)   Resp 16   SpO2 97%   Physical Exam  Constitutional: She is oriented to person, place, and time. She appears well-developed and well-nourished. No distress.  HENT:  Head: Normocephalic and atraumatic.  Mouth/Throat: Oropharynx is clear and moist.  Eyes: Conjunctivae and EOM are normal. Pupils are equal, round, and reactive to light.  Neck: Normal range of motion. Neck supple.  Cardiovascular: Normal rate and regular rhythm.  No murmur heard. Pulmonary/Chest: Effort normal and breath sounds normal. No respiratory distress.  Abdominal: Soft. Bowel sounds are normal.  She exhibits no distension. There is no tenderness.  Genitourinary:  Genitourinary Comments: Patient refuses pelvic exam - she apparently had one last week at urgent care.   Musculoskeletal: Normal range of motion. She exhibits no edema.  Mild tenderness over coccyx   Neurological: She is alert and oriented to person, place, and time.  Skin: Skin is warm and dry.  Psychiatric: She has a normal mood and affect.  Nursing note and vitals reviewed.    ED Treatments / Results  Labs (all labs ordered are listed, but only abnormal results are displayed) Labs Reviewed  URINALYSIS, ROUTINE W REFLEX MICROSCOPIC - Abnormal; Notable for the following components:      Result Value   APPearance CLOUDY (*)    Glucose, UA 150 (*)    Hgb urine  dipstick SMALL (*)    Ketones, ur 5 (*)    Protein, ur 100 (*)    Leukocytes, UA LARGE (*)    Bacteria, UA FEW (*)    Squamous Epithelial / LPF 6-30 (*)    Non Squamous Epithelial 0-5 (*)    All other components within normal limits  CBG MONITORING, ED - Abnormal; Notable for the following components:   Glucose-Capillary 260 (*)    All other components within normal limits  PREGNANCY, URINE    EKG  EKG Interpretation None       Radiology No results found.  Procedures Procedures (including critical care time)  Medications Ordered in ED Medications  ketorolac (TORADOL) 30 MG/ML injection 30 mg (30 mg Intramuscular Given 07/31/17 2156)     Initial Impression / Assessment and Plan / ED Course  I have reviewed the triage vital signs and the nursing notes.  Pertinent labs & imaging results that were available during my care of the patient were reviewed by me and considered in my medical decision making (see chart for details).    MSE Screen complete   Suspect MSK low back pain. UA appears contaminated and cannot exclude UTI. Pelvic exam declined by patient - she desires Diflucan for recurrent yeast infection. BG is "good for her" She is  aware of the need for close FU. Strict return precautions given and understood.   Final Clinical Impressions(s) / ED Diagnoses   Final diagnoses:  Acute low back pain without sciatica, unspecified back pain laterality  Yeast vaginitis    ED Discharge Orders        Ordered    cephALEXin (KEFLEX) 500 MG capsule  4 times daily     07/31/17 2254    fluconazole (DIFLUCAN) 150 MG tablet  Daily     07/31/17 2254    naproxen (NAPROSYN) 500 MG tablet  2 times daily     07/31/17 2254       Valarie Merino, MD 07/31/17 2259

## 2017-07-31 NOTE — ED Triage Notes (Signed)
Pt states hx of back pain off and on. Pt states 4 days of back pain, hard to walk, to lower back. Denies recent injury. Pt also states 3 days of vaginal discharge that is white and thick, with itching.

## 2017-07-31 NOTE — Discharge Instructions (Signed)
Please return for any problem.  °

## 2017-08-10 ENCOUNTER — Ambulatory Visit (HOSPITAL_COMMUNITY)
Admission: EM | Admit: 2017-08-10 | Discharge: 2017-08-10 | Disposition: A | Discharge status: Home / Self Care | Payer: Medicare HMO | Attending: Family Medicine | Admitting: Family Medicine

## 2017-08-10 ENCOUNTER — Encounter (HOSPITAL_COMMUNITY): Payer: Self-pay | Admitting: Emergency Medicine

## 2017-08-10 DIAGNOSIS — R21 Rash and other nonspecific skin eruption: Secondary | ICD-10-CM

## 2017-08-10 MED ORDER — PREDNISONE 20 MG PO TABS: ORAL_TABLET | ORAL | 0 refills | Status: DC

## 2017-08-10 MED ORDER — TRIAMCINOLONE ACETONIDE 0.1 % EX CREA: 1.0000 "application " | TOPICAL_CREAM | Freq: Three times a day (TID) | CUTANEOUS | 1 refills | Status: DC

## 2017-08-10 NOTE — Discharge Instructions (Signed)
Come back if the rash doesn't resolve in 5 days

## 2017-08-10 NOTE — ED Triage Notes (Signed)
Pt here for itchy rash to legs; pt sts thinks could have started after drinking tea with rusty nail in cup

## 2017-08-10 NOTE — ED Provider Notes (Signed)
[facility]   [id number] 08/10/17 Arrival Time: 1612   SUBJECTIVE:  Melissa Washington is a 31 y.o. female who presents to the urgent care with complaint of itchy rash to legs; pt sts thinks could have started after drinking tea with rusty nail in cup     Past Medical History:  Diagnosis Date  . 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 Bacteremia associated with IV line ([place]) 05/01/2013   Port-a-cath infection, removed 8/6 by IR. Ecoli grew out in cultures. Sent home with Ceftriaxone to complete 14 day course.   . Bacteremia due to Escherichia coli 05/01/2013   Port-a-cath infection, removed 8/6 by IR. Ecoli grew out in cultures. Sent home with Ceftriaxone to complete 14 day course.   . Cholelithiasis 01/17/2012  . Collagen vascular disease ([place])   . Diabetes mellitus    type 2, insulin  . Headache(784.0)   . Hepatic steatosis 01/17/2012  . Hypertension   . Leukemia, acute monocytic, in remission ([place])   . Nephrolithiasis   . Obesity    Family History  Problem Relation Age of Onset  . Kidney disease Mother   . Hypertension Mother   . Epilepsy Mother   . Sleep apnea Mother   . Diabetes Father   . Kidney disease Maternal Grandmother   . Diabetes Maternal Grandmother    Social History   Socioeconomic History  . Marital status: Single    Spouse name: Not on file  . Number of children: 1  . Years of education: 22  . Highest education level: Not on file  Social Needs  . Financial resource strain: Not on file  . Food insecurity - worry: Not on file  . Food insecurity - inability: Not on file  . Transportation needs - medical: Not on file  . Transportation needs - non-medical: Not on file  Occupational History  . Occupation: Unemployed    Employer: [employer]  . Occupation: Personal care aide in past  Tobacco Use  . Smoking status: Former Research scientist (life sciences)  . Smokeless tobacco: Never Used  Substance and Sexual Activity  . Alcohol  use: No  . Drug use: No  . Sexual activity: Yes    Partners: Male    Birth control/protection: None  Other Topics Concern  . Not on file  Social History Narrative   Single, lives with her daughter in [place].   No outpatient medications have been marked as taking for the 08/10/17 encounter [facility] Encounter).   Allergies  Allergen Reactions  . Ultram [Tramadol Hcl] Nausea And Vomiting      ROS: As per HPI, remainder of ROS negative.   OBJECTIVE:   Vitals:   08/10/17 1633  BP: 128/80  Pulse: 93  Resp: 18  Temp: 98.6 F (37 C)  TempSrc: Oral  SpO2: 96%     General appearance: alert; no distress Eyes: PERRL; EOMI; conjunctiva normal HENT: normocephalic; atraumatic, external ears normal without trauma; nasal mucosa normal; oral mucosa normal Neck: supple Extremities: no cyanosis or edema; symmetrical with no gross deformities Skin: warm and dry; multiple small red papules on lower legs.  Spares torso and upper extremities Neurologic: normal gait; grossly normal Psychological: alert and cooperative; normal mood and affect      Labs:  Results for orders placed or performed during the hospital encounter of 07/31/17  Pregnancy, urine  Result Value Ref Range   Preg Test, Ur NEGATIVE NEGATIVE  Urinalysis, Routine w reflex microscopic  Result Value  Ref Range   Color, Urine YELLOW YELLOW   APPearance CLOUDY (A) CLEAR   Specific Gravity, Urine 1.014 1.005 - 1.030   pH 5.0 5.0 - 8.0   Glucose, UA 150 (A) NEGATIVE mg/dL   Hgb urine dipstick SMALL (A) NEGATIVE   Bilirubin Urine NEGATIVE NEGATIVE   Ketones, ur 5 (A) NEGATIVE mg/dL   Protein, ur 100 (A) NEGATIVE mg/dL   Nitrite NEGATIVE NEGATIVE   Leukocytes, UA LARGE (A) NEGATIVE   RBC / HPF 6-30 0 - 5 RBC/hpf   WBC, UA TOO NUMEROUS TO COUNT 0 - 5 WBC/hpf   Bacteria, UA FEW (A) NONE SEEN   Squamous Epithelial / LPF 6-30 (A) NONE SEEN   WBC Clumps PRESENT    Mucus PRESENT    Non Squamous Epithelial 0-5 (A)  NONE SEEN  CBG monitoring, ED  Result Value Ref Range   Glucose-Capillary 260 (H) 65 - 99 mg/dL    Labs Reviewed - No data to display  No results found.     ASSESSMENT & PLAN:  1. Rash     Meds ordered this encounter  Medications  . predniSONE (DELTASONE) 20 MG tablet    Sig: one daily with food    Dispense:  5 tablet    Refill:  0  . triamcinolone cream (KENALOG) 0.1 %    Sig: Apply 1 application topically 3 (three) times daily.    Dispense:  80 g    Refill:  1    Reviewed expectations re: course of current medical issues. Questions answered. Outlined signs and symptoms indicating need for more acute intervention. Patient verbalized understanding. After Visit Summary given.       Robyn Haber, MD 08/10/17 [phone]

## 2017-09-17 ENCOUNTER — Other Ambulatory Visit: Payer: Self-pay

## 2017-09-17 ENCOUNTER — Emergency Department (HOSPITAL_COMMUNITY)
Admission: EM | Admit: 2017-09-17 | Discharge: 2017-09-17 | Disposition: A | Discharge status: Home / Self Care | Payer: Medicare HMO | Attending: Emergency Medicine | Admitting: Emergency Medicine

## 2017-09-17 ENCOUNTER — Emergency Department (HOSPITAL_COMMUNITY): Payer: Medicare HMO

## 2017-09-17 ENCOUNTER — Encounter (HOSPITAL_COMMUNITY): Payer: Self-pay | Admitting: Emergency Medicine

## 2017-09-17 DIAGNOSIS — E119 Type 2 diabetes mellitus without complications: Secondary | ICD-10-CM | POA: Diagnosis not present

## 2017-09-17 DIAGNOSIS — Z87442 Personal history of urinary calculi: Secondary | ICD-10-CM | POA: Insufficient documentation

## 2017-09-17 DIAGNOSIS — R109 Unspecified abdominal pain: Secondary | ICD-10-CM

## 2017-09-17 DIAGNOSIS — Z87891 Personal history of nicotine dependence: Secondary | ICD-10-CM | POA: Diagnosis not present

## 2017-09-17 DIAGNOSIS — I10 Essential (primary) hypertension: Secondary | ICD-10-CM | POA: Insufficient documentation

## 2017-09-17 DIAGNOSIS — Z79899 Other long term (current) drug therapy: Secondary | ICD-10-CM | POA: Diagnosis not present

## 2017-09-17 DIAGNOSIS — N390 Urinary tract infection, site not specified: Secondary | ICD-10-CM | POA: Insufficient documentation

## 2017-09-17 DIAGNOSIS — Z794 Long term (current) use of insulin: Secondary | ICD-10-CM | POA: Insufficient documentation

## 2017-09-17 LAB — CBC
HEMATOCRIT: 37.5 % (ref 36.0–46.0)
HEMOGLOBIN: 12.8 g/dL (ref 12.0–15.0)
MCH: 29.2 pg (ref 26.0–34.0)
MCHC: 34.1 g/dL (ref 30.0–36.0)
MCV: 85.6 fL (ref 78.0–100.0)
Platelets: 228 10*3/uL (ref 150–400)
RBC: 4.38 MIL/uL (ref 3.87–5.11)
RDW: 13.1 % (ref 11.5–15.5)
WBC: 8.3 10*3/uL (ref 4.0–10.5)

## 2017-09-17 LAB — BASIC METABOLIC PANEL
ANION GAP: 12 (ref 5–15)
BUN: 14 mg/dL (ref 6–20)
CHLORIDE: 99 mmol/L — AB (ref 101–111)
CO2: 23 mmol/L (ref 22–32)
Calcium: 9.2 mg/dL (ref 8.9–10.3)
Creatinine, Ser: 0.81 mg/dL (ref 0.44–1.00)
GFR calc Af Amer: >60 mL/min (ref >60)
GFR calc non Af Amer: >60 mL/min (ref >60)
GLUCOSE: 318 mg/dL — AB (ref 65–99)
Potassium: 4 mmol/L (ref 3.5–5.1)
Sodium: 134 mmol/L — ABNORMAL LOW (ref 135–145)

## 2017-09-17 LAB — URINALYSIS, ROUTINE W REFLEX MICROSCOPIC
BILIRUBIN URINE: NEGATIVE
Glucose, UA: >=500 mg/dL — AB
HGB URINE DIPSTICK: NEGATIVE
Ketones, ur: NEGATIVE mg/dL
NITRITE: NEGATIVE
Protein, ur: NEGATIVE mg/dL
SPECIFIC GRAVITY, URINE: 1.016 (ref 1.005–1.030)
pH: 5 (ref 5.0–8.0)

## 2017-09-17 LAB — I-STAT BETA HCG BLOOD, ED (MC, WL, AP ONLY)

## 2017-09-17 LAB — CBG MONITORING, ED: GLUCOSE-CAPILLARY: 315 mg/dL — AB (ref 65–99)

## 2017-09-17 MED ORDER — CIPROFLOXACIN HCL 500 MG PO TABS: 500.0000 mg | ORAL_TABLET | Freq: Two times a day (BID) | ORAL | 0 refills | Status: DC

## 2017-09-17 MED ORDER — KETOROLAC TROMETHAMINE 60 MG/2ML IM SOLN
60.0000 mg | Freq: Once | INTRAMUSCULAR | Status: AC
  Administered 2017-09-17: 60 mg via INTRAMUSCULAR
  Filled 2017-09-17: qty 2

## 2017-09-17 NOTE — Discharge Instructions (Signed)
Cipro as prescribed.  Drink plenty of fluids.  Follow-up with your primary doctor if not improving in the next 3-4 days, and return to the ER if symptoms significantly worsen or change.

## 2017-09-17 NOTE — ED Provider Notes (Signed)
[facility] EMERGENCY DEPARTMENT Provider Note   CSN: [id number] Arrival date & time: 09/17/17  0153     History   Chief Complaint Chief Complaint  Patient presents with  . Flank Pain    HPI Melissa Washington is a 31 y.o. female.  Patient is a 31 year old female with history of obesity, diabetes, leukemia, recurrent UTIs.  She presents with complaints of left flank pain that started yesterday evening.  She denies any burning with urination, blood in her urine, fever.  She denies any injury or trauma.   The history is provided by the patient.  Flank Pain  This is a new problem. Episode onset: Yesterday evening. The problem occurs constantly. The problem has been rapidly worsening. Pertinent negatives include no abdominal pain. Nothing aggravates the symptoms. Nothing relieves the symptoms. She has tried nothing for the symptoms.    Past Medical History:  Diagnosis Date  . 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 Bacteremia associated with IV line ([place]) 05/01/2013   Port-a-cath infection, removed 8/6 by IR. Ecoli grew out in cultures. Sent home with Ceftriaxone to complete 14 day course.   . Bacteremia due to Escherichia coli 05/01/2013   Port-a-cath infection, removed 8/6 by IR. Ecoli grew out in cultures. Sent home with Ceftriaxone to complete 14 day course.   . Cholelithiasis 01/17/2012  . Collagen vascular disease ([place])   . Diabetes mellitus    type 2, insulin  . Headache(784.0)   . Hepatic steatosis 01/17/2012  . Hypertension   . Leukemia, acute monocytic, in remission ([place])   . Nephrolithiasis   . Obesity     Patient Active Problem List   Diagnosis Date Noted  . Acute pyelonephritis 04/07/2017  . Vaginitis and vulvovaginitis 10/09/2014  . Alopecia 06/26/2014  . Bilateral hip pain 06/19/2014  . Anxiety associated with depression 06/19/2014  . Allergic rhinitis 06/07/2014  . IUD complication ([place]) 81/44/8185  . Exposure to STD  06/07/2014  . 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 Morbid obesity with BMI of 50.0-59.9, adult ([place]) 01/20/2012  . DM (diabetes mellitus), type 2, uncontrolled ([place]) 12/30/2006  . HYPERTENSION, BENIGN ESSENTIAL 12/30/2006    Past Surgical History:  Procedure Laterality Date  . CHOLECYSTECTOMY  01/31/2012   Procedure: LAPAROSCOPIC CHOLECYSTECTOMY WITH INTRAOPERATIVE CHOLANGIOGRAM;  Surgeon: Marcello Moores A. Cornett, MD;  Location: [facility];  Service: General;  Laterality: N/A;  . PORTACATH PLACEMENT     [place]  . portacath removal      OB History    Gravida Para Term Preterm AB Living   _0 0 0 2   SAB TAB Ectopic Multiple Live Births   0 0 0 0 2       Home Medications    Prior to Admission medications   Medication Sig Start Date End Date Taking? Authorizing Provider  acetaminophen (TYLENOL) 325 MG tablet Take 2 tablets (650 mg total) by mouth every 6 (six) hours as needed for mild pain, moderate pain or fever (fever > 100.4). 04/11/17   Dessa Phi, DO  blood glucose meter kit and supplies Dispense based on patient and insurance preference. Use up to four times daily as directed. (FOR ICD-9 250.00, 250.01). 04/11/17   Dessa Phi, DO  ibuprofen (ADVIL,MOTRIN) 800 MG tablet Take 1 tablet (800 mg total) by mouth every 6 (six) hours as needed for fever, headache, mild pain, moderate pain or cramping (fever > 100.4, alternate with tylenol). 04/11/17  Dessa Phi, DO  insulin aspart (NOVOLOG FLEXPEN) 100 UNIT/ML FlexPen 3 units with meals PLUS sliding scale:  CBG 121 - 150: 3 units CBG 151 - 200: 4 units CBG 201 - 250: 7 units CBG 251 - 300: 11 units CBG 301 - 350: 15 units CBG 351 - 400: 20 units 04/11/17   Dessa Phi, DO  Insulin Glargine (LANTUS) 100 UNIT/ML Solostar Pen Inject 30 Units into the skin daily at 10 pm. Patient taking differently: Inject 20 Units daily at 10 pm into the skin.  04/11/17 07/31/17  Dessa Phi, DO  Insulin Pen Needle 31G X 5  MM MISC Use with insulin, 4 times daily 04/11/17   Dessa Phi, DO  levonorgestrel (MIRENA) 20 MCG/24HR IUD 1 each by Intrauterine route continuous.    [provider]  lisinopril (PRINIVIL,ZESTRIL) 5 MG tablet Take 1 tablet (5 mg total) by mouth daily. 04/11/17   Dessa Phi, DO  metFORMIN (GLUCOPHAGE) 1000 MG tablet Take 1 tablet (1,000 mg total) by mouth 2 (two) times daily with a meal. 04/11/17   Dessa Phi, DO  predniSONE (DELTASONE) 20 MG tablet one daily with food 08/10/17   Robyn Haber, MD  triamcinolone cream (KENALOG) 0.1 % Apply 1 application topically 3 (three) times daily. 08/10/17   Robyn Haber, MD    Family History Family History  Problem Relation Age of Onset  . Kidney disease Mother   . Hypertension Mother   . Epilepsy Mother   . Sleep apnea Mother   . Diabetes Father   . Kidney disease Maternal Grandmother   . Diabetes Maternal Grandmother     Social History Social History   Tobacco Use  . Smoking status: Former Research scientist (life sciences)  . Smokeless tobacco: Never Used  Substance Use Topics  . Alcohol use: No  . Drug use: No     Allergies   Ultram [tramadol hcl]   Review of Systems Review of Systems  Gastrointestinal: Negative for abdominal pain.  Genitourinary: Positive for flank pain.  All other systems reviewed and are negative.    Physical Exam Updated Vital Signs BP 121/77 (BP Location: Right Arm)   Pulse 86   Temp 99 F (37.2 C) (Oral)   Resp 18   Ht '5\' 2"'$  (1.575 m)   Wt 123.8 kg (273 lb)   SpO2 100%   BMI 49.93 kg/m   Physical Exam  Constitutional: She is oriented to person, place, and time. She appears well-developed and well-nourished. No distress.  HENT:  Head: Normocephalic and atraumatic.  Neck: Normal range of motion. Neck supple.  Cardiovascular: Normal rate and regular rhythm. Exam reveals no gallop and no friction rub.  No murmur heard. Pulmonary/Chest: Effort normal and breath sounds normal. No respiratory  distress. She has no wheezes.  Abdominal: Soft. Bowel sounds are normal. She exhibits no distension. There is no tenderness.  There is mild left-sided CVA tenderness.  Musculoskeletal: Normal range of motion.  Neurological: She is alert and oriented to person, place, and time.  Skin: Skin is warm and dry. She is not diaphoretic.  Nursing note and vitals reviewed.    ED Treatments / Results  Labs (all labs ordered are listed, but only abnormal results are displayed) Labs Reviewed  URINALYSIS, ROUTINE W REFLEX MICROSCOPIC - Abnormal; Notable for the following components:      Result Value   Color, Urine STRAW (*)    Glucose, UA >=500 (*)    Leukocytes, UA TRACE (*)    Bacteria, UA RARE (*)  Squamous Epithelial / LPF 0-5 (*)    All other components within normal limits  BASIC METABOLIC PANEL - Abnormal; Notable for the following components:   Sodium 134 (*)    Chloride 99 (*)    Glucose, Bld 318 (*)    All other components within normal limits  CBG MONITORING, ED - Abnormal; Notable for the following components:   Glucose-Capillary 315 (*)    All other components within normal limits  CBC  I-STAT BETA HCG BLOOD, ED ([facility], [facility], [facility] ONLY)    EKG  EKG Interpretation None       Radiology No results found.  Procedures Procedures (including critical care time)  Medications Ordered in ED Medications  ketorolac (TORADOL) injection 60 mg (not administered)     Initial Impression / Assessment and Plan / ED Course  I have reviewed the triage vital signs and the nursing notes.  Pertinent labs & imaging results that were available during my care of the patient were reviewed by me and considered in my medical decision making (see chart for details).  Patient presents with complaints of pain in her flank and concerned she may be developing a UTI.  There is nothing on her skin that would suggest a kidney stone.  She does have trace leukocytes and rare bacteria.  Due to the nature  of her symptoms and history of UTIs, she will be prescribed an antibiotic and is to follow-up as needed if she worsens.  Final Clinical Impressions(s) / ED Diagnoses   Final diagnoses:  None    ED Discharge Orders    None       Veryl Speak, MD 09/17/17 [phone]

## 2017-09-17 NOTE — ED Triage Notes (Signed)
Pt c/o 10/10 left flank pain that woke her up today, denies any urinary symptoms states she has a hx of kidney infections and feels like that now. No fever, chills, n/v.

## 2017-09-17 NOTE — ED Notes (Signed)
E signature pad not working. Pt verbalizes understanding of dc instructions  

## 2017-10-01 ENCOUNTER — Ambulatory Visit: Payer: Medicare HMO

## 2017-10-13 ENCOUNTER — Other Ambulatory Visit: Payer: Self-pay

## 2017-10-13 ENCOUNTER — Encounter (HOSPITAL_COMMUNITY): Payer: Self-pay | Admitting: Emergency Medicine

## 2017-10-13 ENCOUNTER — Emergency Department (HOSPITAL_COMMUNITY)
Admission: EM | Admit: 2017-10-13 | Discharge: 2017-10-13 | Discharge status: Against Medical Advice | Payer: Medicare HMO

## 2017-10-13 NOTE — ED Notes (Signed)
After pt was registered she left to park her car

## 2017-10-13 NOTE — ED Notes (Signed)
After 1 hour Pt never returned from parking her car. Called for triage x 5 with no answer. Pt to be discharged for system at this time.

## 2017-10-25 ENCOUNTER — Ambulatory Visit: Payer: Medicare HMO | Admitting: Obstetrics & Gynecology

## 2017-11-15 ENCOUNTER — Ambulatory Visit (INDEPENDENT_AMBULATORY_CARE_PROVIDER_SITE_OTHER): Payer: Medicare HMO | Admitting: Physician Assistant

## 2017-12-14 ENCOUNTER — Ambulatory Visit: Payer: Medicare HMO | Admitting: Family Medicine

## 2017-12-14 ENCOUNTER — Encounter: Payer: Self-pay | Admitting: Family Medicine

## 2017-12-14 ENCOUNTER — Encounter: Payer: Self-pay | Admitting: *Deleted

## 2017-12-14 NOTE — Progress Notes (Signed)
Patient no showed for IUD removal. Per Dr Kennon Rounds she may reschedule as needed.

## 2017-12-14 NOTE — Progress Notes (Signed)
Patient did not keep appointment today. She may call to reschedule.  

## 2018-01-03 ENCOUNTER — Emergency Department (HOSPITAL_COMMUNITY)
Admission: EM | Admit: 2018-01-03 | Discharge: 2018-01-03 | Disposition: A | Discharge status: Home / Self Care | Payer: Medicare HMO | Attending: Physician Assistant | Admitting: Physician Assistant

## 2018-01-03 ENCOUNTER — Other Ambulatory Visit: Payer: Self-pay

## 2018-01-03 ENCOUNTER — Encounter (HOSPITAL_COMMUNITY): Payer: Self-pay | Admitting: Emergency Medicine

## 2018-01-03 DIAGNOSIS — E119 Type 2 diabetes mellitus without complications: Secondary | ICD-10-CM | POA: Insufficient documentation

## 2018-01-03 DIAGNOSIS — Z794 Long term (current) use of insulin: Secondary | ICD-10-CM | POA: Insufficient documentation

## 2018-01-03 DIAGNOSIS — Z87891 Personal history of nicotine dependence: Secondary | ICD-10-CM | POA: Diagnosis not present

## 2018-01-03 DIAGNOSIS — I10 Essential (primary) hypertension: Secondary | ICD-10-CM | POA: Diagnosis not present

## 2018-01-03 DIAGNOSIS — R51 Headache: Secondary | ICD-10-CM | POA: Diagnosis present

## 2018-01-03 DIAGNOSIS — G44209 Tension-type headache, unspecified, not intractable: Secondary | ICD-10-CM | POA: Insufficient documentation

## 2018-01-03 LAB — CBG MONITORING, ED: GLUCOSE-CAPILLARY: 230 mg/dL — AB (ref 65–99)

## 2018-01-03 MED ORDER — SODIUM CHLORIDE 0.9 % IV BOLUS
1000.0000 mL | Freq: Once | INTRAVENOUS | Status: AC
  Administered 2018-01-03: 1000 mL via INTRAVENOUS

## 2018-01-03 MED ORDER — ACETAMINOPHEN 325 MG PO TABS
650.0000 mg | ORAL_TABLET | Freq: Once | ORAL | Status: AC
  Administered 2018-01-03: 650 mg via ORAL
  Filled 2018-01-03: qty 2

## 2018-01-03 MED ORDER — METOCLOPRAMIDE HCL 5 MG/ML IJ SOLN
10.0000 mg | Freq: Once | INTRAMUSCULAR | Status: AC
Start: 2018-01-03 — End: 2018-01-03
  Administered 2018-01-03: 10 mg via INTRAVENOUS
  Filled 2018-01-03: qty 2

## 2018-01-03 NOTE — ED Provider Notes (Signed)
[facility] EMERGENCY DEPARTMENT Provider Note   CSN: [id number] Arrival date & time: 01/03/18  0751     History   Chief Complaint Chief Complaint  Patient presents with  . Headache    HPI Melissa Washington is a 32 y.o. female with a past medical history of IDDM, leukemia currently in remission, hypertension, headaches who presents to ED for evaluation of gradual onset, tension type headache since yesterday.  She reports associated blurry vision and dizziness.  She tried to make the headache go away by going to sleep but states that this did not help.  She does have a history of similar symptoms in the past which usually resolved with supportive measures.  She has not taking any medications prior to arrival to help with symptoms.  She denies any head injuries or falls, numbness in arms or legs, fever, URI symptoms, vomiting, neck pain.  HPI  Past Medical History:  Diagnosis Date  . 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 Bacteremia associated with IV line ([place]) 05/01/2013   Port-a-cath infection, removed 8/6 by IR. Ecoli grew out in cultures. Sent home with Ceftriaxone to complete 14 day course.   . Bacteremia due to Escherichia coli 05/01/2013   Port-a-cath infection, removed 8/6 by IR. Ecoli grew out in cultures. Sent home with Ceftriaxone to complete 14 day course.   . Cholelithiasis 01/17/2012  . Collagen vascular disease ([place])   . Diabetes mellitus    type 2, insulin  . Headache(784.0)   . Hepatic steatosis 01/17/2012  . Hypertension   . Leukemia, acute monocytic, in remission ([place])   . Nephrolithiasis   . Obesity     Patient Active Problem List   Diagnosis Date Noted  . Acute pyelonephritis 04/07/2017  . Vaginitis and vulvovaginitis 10/09/2014  . Alopecia 06/26/2014  . Bilateral hip pain 06/19/2014  . Anxiety associated with depression 06/19/2014  . Allergic rhinitis 06/07/2014  . IUD complication ([place]) 49/82/6415  . Exposure to  STD 06/07/2014  . 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 Morbid obesity with BMI of 50.0-59.9, adult ([place]) 01/20/2012  . DM (diabetes mellitus), type 2, uncontrolled ([place]) 12/30/2006  . HYPERTENSION, BENIGN ESSENTIAL 12/30/2006    Past Surgical History:  Procedure Laterality Date  . CHOLECYSTECTOMY  01/31/2012   Procedure: LAPAROSCOPIC CHOLECYSTECTOMY WITH INTRAOPERATIVE CHOLANGIOGRAM;  Surgeon: Marcello Moores A. Cornett, MD;  Location: [facility];  Service: General;  Laterality: N/A;  . PORTACATH PLACEMENT     [place]  . portacath removal       OB History    Gravida  2   Para  2   Term  2   Preterm  0   AB  0   Living  2     SAB  0   TAB  0   Ectopic  0   Multiple  0   Live Births  2            Home Medications    Prior to Admission medications   Medication Sig Start Date End Date Taking? Authorizing Provider  Insulin Glargine (LANTUS) 100 UNIT/ML Solostar Pen Inject 30 Units into the skin daily at 10 pm. Patient taking differently: Inject 20 Units daily at 10 pm into the skin.  04/11/17 09/18/23 Yes Dessa Phi, DO  levonorgestrel (MIRENA) 20 MCG/24HR IUD 1 each by Intrauterine route continuous.   Yes [provider]  lisinopril (PRINIVIL,ZESTRIL) 5 MG tablet Take 1 tablet (5 mg  total) by mouth daily. 04/11/17  Yes Dessa Phi, DO  metFORMIN (GLUCOPHAGE) 1000 MG tablet Take 1 tablet (1,000 mg total) by mouth 2 (two) times daily with a meal. Patient taking differently: Take 500 mg by mouth 2 (two) times daily with a meal.  04/11/17  Yes Dessa Phi, DO  acetaminophen (TYLENOL) 325 MG tablet Take 2 tablets (650 mg total) by mouth every 6 (six) hours as needed for mild pain, moderate pain or fever (fever > 100.4). Patient not taking: Reported on 09/17/2017 04/11/17   Dessa Phi, DO  blood glucose meter kit and supplies Dispense based on patient and insurance preference. Use up to four times daily as directed. (FOR ICD-9 250.00, 250.01).  04/11/17   Dessa Phi, DO  ciprofloxacin (CIPRO) 500 MG tablet Take 1 tablet (500 mg total) by mouth 2 (two) times daily. One po bid x 5 days 09/17/17   Veryl Speak, MD  ibuprofen (ADVIL,MOTRIN) 800 MG tablet Take 1 tablet (800 mg total) by mouth every 6 (six) hours as needed for fever, headache, mild pain, moderate pain or cramping (fever > 100.4, alternate with tylenol). Patient not taking: Reported on 09/17/2017 04/11/17   Dessa Phi, DO  insulin aspart (NOVOLOG FLEXPEN) 100 UNIT/ML FlexPen 3 units with meals PLUS sliding scale:  CBG 121 - 150: 3 units CBG 151 - 200: 4 units CBG 201 - 250: 7 units CBG 251 - 300: 11 units CBG 301 - 350: 15 units CBG 351 - 400: 20 units Patient not taking: Reported on 01/03/2018 04/11/17   Dessa Phi, DO  Insulin Pen Needle 31G X 5 MM MISC Use with insulin, 4 times daily 04/11/17   Dessa Phi, DO  predniSONE (DELTASONE) 20 MG tablet one daily with food Patient not taking: Reported on 09/17/2017 08/10/17   Robyn Haber, MD  triamcinolone cream (KENALOG) 0.1 % Apply 1 application topically 3 (three) times daily. Patient not taking: Reported on 09/17/2017 08/10/17   Robyn Haber, MD    Family History Family History  Problem Relation Age of Onset  . Kidney disease Mother   . Hypertension Mother   . Epilepsy Mother   . Sleep apnea Mother   . Diabetes Father   . Kidney disease Maternal Grandmother   . Diabetes Maternal Grandmother     Social History Social History   Tobacco Use  . Smoking status: Former Research scientist (life sciences)  . Smokeless tobacco: Never Used  Substance Use Topics  . Alcohol use: No  . Drug use: No     Allergies   Ultram [tramadol hcl]   Review of Systems Review of Systems  Constitutional: Negative for appetite change, chills and fever.  HENT: Negative for ear pain, rhinorrhea, sneezing and sore throat.   Eyes: Positive for visual disturbance. Negative for photophobia.  Respiratory: Negative for cough, chest  tightness, shortness of breath and wheezing.   Cardiovascular: Negative for chest pain and palpitations.  Gastrointestinal: Positive for nausea. Negative for abdominal pain, blood in stool, constipation, diarrhea and vomiting.  Genitourinary: Negative for dysuria, hematuria and urgency.  Musculoskeletal: Negative for myalgias.  Skin: Negative for rash.  Neurological: Positive for headaches. Negative for dizziness, weakness and light-headedness.     Physical Exam Updated Vital Signs BP (!) 123/91   Pulse 66   Temp 98.4 F (36.9 C) (Oral)   Resp 16   SpO2 100%   Physical Exam  Constitutional: She is oriented to person, place, and time. She appears well-developed and well-nourished. No distress.  Nontoxic appearing and  in no acute distress. Speaking on phone comfortably.  HENT:  Head: Normocephalic and atraumatic.  Nose: Nose normal.  Eyes: Pupils are equal, round, and reactive to light. Conjunctivae and EOM are normal. Right eye exhibits no discharge. Left eye exhibits no discharge. No scleral icterus.  Neck: Normal range of motion. Neck supple.  Cardiovascular: Normal rate, regular rhythm, normal heart sounds and intact distal pulses. Exam reveals no gallop and no friction rub.  No murmur heard. Pulmonary/Chest: Effort normal and breath sounds normal. No respiratory distress.  Abdominal: Soft. Bowel sounds are normal. She exhibits no distension. There is no tenderness. There is no guarding.  Musculoskeletal: Normal range of motion. She exhibits no edema.  Neurological: She is alert and oriented to person, place, and time. No cranial nerve deficit or sensory deficit. She exhibits normal muscle tone. Coordination normal.  Pupils reactive. No facial asymmetry noted. Cranial nerves appear grossly intact. Sensation intact to light touch on face, BUE and BLE. Strength 5/5 in BUE and BLE.   Skin: Skin is warm and dry. No rash noted.  Psychiatric: She has a normal mood and affect.  Nursing  note and vitals reviewed.    ED Treatments / Results  Labs (all labs ordered are listed, but only abnormal results are displayed) Labs Reviewed  CBG MONITORING, ED - Abnormal; Notable for the following components:      Result Value   Glucose-Capillary 230 (*)    All other components within normal limits    EKG None  Radiology No results found.  Procedures Procedures (including critical care time)  Medications Ordered in ED Medications  sodium chloride 0.9 % bolus 1,000 mL (1,000 mLs Intravenous New Bag/Given 01/03/18 0626)  acetaminophen (TYLENOL) tablet 650 mg (has no administration in time range)  metoCLOPramide (REGLAN) injection 10 mg (10 mg Intravenous Given 01/03/18 9485)     Initial Impression / Assessment and Plan / ED Course  I have reviewed the triage vital signs and the nursing notes.  Pertinent labs & imaging results that were available during my care of the patient were reviewed by me and considered in my medical decision making (see chart for details).     Patient presents to ED for evaluation of gradual onset of headache since yesterday.  She states that the headache feels like a band around her temples.  She does have a history of similar symptoms in the past which usually resolve with supportive measures.  She is not take any medications prior to arrival to help with symptoms.  She does report intermittent blurry vision which is usual for her headaches.  Denies any injuries or falls, numbness in arms or legs, vomiting, fever or neck pain.  On physical exam she is overall well-appearing.  She is resting comfortably and speaking on her phone without difficulty.  She has no deficits on her neurological examination.  Patient given Reglan and Tylenol with improvement in her symptoms.  She is able to tolerate p.o. intake without difficulty. There are no headache characteristics that are lateralizing or concerning for increased ICP, infectious or vascular cause of her  symptoms.    Suspect that her symptoms are due to tension type headache. Advised to return for any severe or worsening.  Portions of this note were generated with [employer]. Dictation errors may occur despite best attempts at proofreading.   Final Clinical Impressions(s) / ED Diagnoses   Final diagnoses:  Tension-type headache, not intractable, unspecified chronicity pattern    ED Discharge Orders  None       Delia Heady, PA-C 01/03/18 1035    [place], Fredia Sorrow, MD 01/03/18 1601

## 2018-01-03 NOTE — ED Triage Notes (Signed)
Pt states yesterday afternoon she had a headache with dizziness and blurry vision that she woke up with this am as well.

## 2018-01-27 ENCOUNTER — Encounter (HOSPITAL_COMMUNITY): Payer: Self-pay | Admitting: Family Medicine

## 2018-01-27 ENCOUNTER — Ambulatory Visit (HOSPITAL_COMMUNITY)
Admission: EM | Admit: 2018-01-27 | Discharge: 2018-01-27 | Disposition: A | Discharge status: Home / Self Care | Payer: Medicare HMO | Attending: Family Medicine | Admitting: Family Medicine

## 2018-01-27 ENCOUNTER — Ambulatory Visit (INDEPENDENT_AMBULATORY_CARE_PROVIDER_SITE_OTHER): Payer: Medicare HMO

## 2018-01-27 DIAGNOSIS — G8929 Other chronic pain: Secondary | ICD-10-CM

## 2018-01-27 DIAGNOSIS — M25551 Pain in right hip: Secondary | ICD-10-CM

## 2018-01-27 DIAGNOSIS — M898X5 Other specified disorders of bone, thigh: Secondary | ICD-10-CM | POA: Diagnosis not present

## 2018-01-27 DIAGNOSIS — H1013 Acute atopic conjunctivitis, bilateral: Secondary | ICD-10-CM

## 2018-01-27 MED ORDER — KETOTIFEN FUMARATE 0.025 % OP SOLN: 1.0000 [drp] | Freq: Two times a day (BID) | OPHTHALMIC | 0 refills | Status: DC

## 2018-01-27 MED ORDER — NAPROXEN 375 MG PO TABS: 375.0000 mg | ORAL_TABLET | Freq: Two times a day (BID) | ORAL | 0 refills | Status: DC

## 2018-01-27 MED ORDER — CETIRIZINE HCL 10 MG PO TABS: 10.0000 mg | ORAL_TABLET | Freq: Every day | ORAL | 0 refills | Status: DC

## 2018-01-27 NOTE — ED Provider Notes (Addendum)
[facility]    CSN: [id number] Arrival date & time: 01/27/18  1244     History   Chief Complaint Chief Complaint  Patient presents with  . Allergies    HPI Melissa Washington is a 32 y.o. female.   Melissa Washington presents with complaints of some nasal congestion, worse in the morning as well as bilateral eyes tearing and itching which has been ongoing for the past 3-4 days. No vision change or eye pain. Denies cough or sore throat. Left ear feels itchy. Has not taken any medications for symptoms. Denies any previous similar. No gi/gu complaints. No fevers.  Also complaints of acute on chronic right hip pain. Has been ongoing for the over a year, has seen ortho for this, visit from 03/2017 reviewed without acute findings. Stretching, glucose control and weight loss were recommended at that time. States hip has increased over the past 2 weeks but now also with right thigh pain which is new for her. No new injury. Has been moving recently, has not been checking blood sugars. No recent travel, does not smoke, not on oral birth control. States she feels she has general hip and thigh swelling, without redness or warmth. Has not been taking any medications for this pain. without numbness or tingling to leg or foot. Hx Dm, htn, leukemia in remission, obesity.     ROS per HPI.      Past Medical History:  Diagnosis Date  . 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 Bacteremia associated with IV line ([place]) 05/01/2013   Port-a-cath infection, removed 8/6 by IR. Ecoli grew out in cultures. Sent home with Ceftriaxone to complete 14 day course.   . Bacteremia due to Escherichia coli 05/01/2013   Port-a-cath infection, removed 8/6 by IR. Ecoli grew out in cultures. Sent home with Ceftriaxone to complete 14 day course.   . Cholelithiasis 01/17/2012  . Collagen vascular disease ([place])   . Diabetes mellitus    type 2, insulin  . Headache(784.0)   . Hepatic steatosis 01/17/2012  .  Hypertension   . Leukemia, acute monocytic, in remission ([place])   . Nephrolithiasis   . Obesity     Patient Active Problem List   Diagnosis Date Noted  . Acute pyelonephritis 04/07/2017  . Vaginitis and vulvovaginitis 10/09/2014  . Alopecia 06/26/2014  . Bilateral hip pain 06/19/2014  . Anxiety associated with depression 06/19/2014  . Allergic rhinitis 06/07/2014  . IUD complication ([place]) 70/62/3762  . Exposure to STD 06/07/2014  . 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 Morbid obesity with BMI of 50.0-59.9, adult ([place]) 01/20/2012  . DM (diabetes mellitus), type 2, uncontrolled ([place]) 12/30/2006  . HYPERTENSION, BENIGN ESSENTIAL 12/30/2006    Past Surgical History:  Procedure Laterality Date  . CHOLECYSTECTOMY  01/31/2012   Procedure: LAPAROSCOPIC CHOLECYSTECTOMY WITH INTRAOPERATIVE CHOLANGIOGRAM;  Surgeon: Marcello Moores A. Cornett, MD;  Location: [facility];  Service: General;  Laterality: N/A;  . PORTACATH PLACEMENT     [place]  . portacath removal      OB History    Gravida  2   Para  2   Term  2   Preterm  0   AB  0   Living  2     SAB  0   TAB  0   Ectopic  0   Multiple  0   Live Births  2            Home Medications  Prior to Admission medications   Medication Sig Start Date End Date Taking? Authorizing Provider  blood glucose meter kit and supplies Dispense based on patient and insurance preference. Use up to four times daily as directed. (FOR ICD-9 250.00, 250.01). 04/11/17   Dessa Phi, DO  cetirizine (ZYRTEC) 10 MG tablet Take 1 tablet (10 mg total) by mouth daily. 01/27/18   Zigmund Gottron, NP  ciprofloxacin (CIPRO) 500 MG tablet Take 1 tablet (500 mg total) by mouth 2 (two) times daily. One po bid x 5 days 09/17/17   Veryl Speak, MD  insulin aspart (NOVOLOG FLEXPEN) 100 UNIT/ML FlexPen 3 units with meals PLUS sliding scale:  CBG 121 - 150: 3 units CBG 151 - 200: 4 units CBG 201 - 250: 7 units CBG 251 - 300: 11 units CBG  301 - 350: 15 units CBG 351 - 400: 20 units Patient not taking: Reported on 01/03/2018 04/11/17   Dessa Phi, DO  Insulin Glargine (LANTUS) 100 UNIT/ML Solostar Pen Inject 30 Units into the skin daily at 10 pm. Patient taking differently: Inject 20 Units daily at 10 pm into the skin.  04/11/17 09/18/23  Dessa Phi, DO  Insulin Pen Needle 31G X 5 MM MISC Use with insulin, 4 times daily 04/11/17   Dessa Phi, DO  ketotifen (ZADITOR) 0.025 % ophthalmic solution Place 1 drop into both eyes 2 (two) times daily. 01/27/18   Zigmund Gottron, NP  levonorgestrel (MIRENA) 20 MCG/24HR IUD 1 each by Intrauterine route continuous.    [provider]  lisinopril (PRINIVIL,ZESTRIL) 5 MG tablet Take 1 tablet (5 mg total) by mouth daily. 04/11/17   Dessa Phi, DO  metFORMIN (GLUCOPHAGE) 1000 MG tablet Take 1 tablet (1,000 mg total) by mouth 2 (two) times daily with a meal. Patient taking differently: Take 500 mg by mouth 2 (two) times daily with a meal.  04/11/17   Dessa Phi, DO  naproxen (NAPROSYN) 375 MG tablet Take 1 tablet (375 mg total) by mouth 2 (two) times daily. 01/27/18   Zigmund Gottron, NP  predniSONE (DELTASONE) 20 MG tablet one daily with food Patient not taking: Reported on 09/17/2017 08/10/17   Robyn Haber, MD  triamcinolone cream (KENALOG) 0.1 % Apply 1 application topically 3 (three) times daily. Patient not taking: Reported on 09/17/2017 08/10/17   Robyn Haber, MD    Family History Family History  Problem Relation Age of Onset  . Kidney disease Mother   . Hypertension Mother   . Epilepsy Mother   . Sleep apnea Mother   . Diabetes Father   . Kidney disease Maternal Grandmother   . Diabetes Maternal Grandmother     Social History Social History   Tobacco Use  . Smoking status: Former Research scientist (life sciences)  . Smokeless tobacco: Never Used  Substance Use Topics  . Alcohol use: No  . Drug use: No     Allergies   Ultram [tramadol hcl]   Review of  Systems Review of Systems   Physical Exam Triage Vital Signs ED Triage Vitals  Enc Vitals Group     BP 01/27/18 1303 124/82     Pulse Rate 01/27/18 1303 80     Resp 01/27/18 1303 18     Temp 01/27/18 1303 98.2 F (36.8 C)     Temp Source 01/27/18 1303 Oral     SpO2 01/27/18 1303 97 %     Weight --      Height --      Head Circumference --  Peak Flow --      Pain Score 01/27/18 1302 8     Pain Loc --      Pain Edu? --      Excl. in [place]? --    No data found.  Updated Vital Signs BP 124/82 (BP Location: Left Arm)   Pulse 80   Temp 98.2 F (36.8 C) (Oral)   Resp 18   SpO2 97%    Physical Exam  Constitutional: She is oriented to person, place, and time. She appears well-developed and well-nourished. No distress.  HENT:  Head: Normocephalic and atraumatic.  Right Ear: Tympanic membrane, external ear and ear canal normal.  Left Ear: Tympanic membrane, external ear and ear canal normal.  Nose: Rhinorrhea present. Right sinus exhibits no maxillary sinus tenderness and no frontal sinus tenderness. Left sinus exhibits no maxillary sinus tenderness and no frontal sinus tenderness.  Mouth/Throat: Uvula is midline, oropharynx is clear and moist and mucous membranes are normal. No tonsillar exudate.  Eyes: Pupils are equal, round, and reactive to light. Conjunctivae and EOM are normal. Right eye exhibits no discharge. Left eye exhibits no discharge.  Cardiovascular: Normal rate, regular rhythm and normal heart sounds.  Pulmonary/Chest: Effort normal and breath sounds normal.  Musculoskeletal:       Right hip: She exhibits tenderness and bony tenderness. She exhibits normal range of motion, normal strength, no swelling, no crepitus, no deformity and no laceration.       Right upper leg: She exhibits tenderness. She exhibits no swelling, no edema, no deformity and no laceration.  Right hip and thigh pain with ROm to right hip; pain with external and internal rotation as well as hip  flexion; tenderness diffusely to proximal femur and to hip; without visible redness, swelling, warmth; strong and equal pedal pulses bilaterally; strength equal with push/pulls to lower legs, ambulatory.   Neurological: She is alert and oriented to person, place, and time.  Skin: Skin is warm and dry.     UC Treatments / Results  Labs (all labs ordered are listed, but only abnormal results are displayed) Labs Reviewed - No data to display  EKG None  Radiology Dg Femur Min 2 Views Right  Result Date: 01/27/2018 CLINICAL DATA:  RIGHT leg pain for 3 days.  No reported injury. EXAM: RIGHT FEMUR 2 VIEWS COMPARISON:  None. FINDINGS: There is no evidence of fracture or other focal bone lesions. Soft tissues are unremarkable. IMPRESSION: Negative. Electronically Signed   By: Staci Righter M.D.   On: 01/27/2018 14:32    Procedures Procedures (including critical care time)  Medications Ordered in UC Medications - No data to display  Initial Impression / Assessment and Plan / UC Course  I have reviewed the triage vital signs and the nursing notes.  Pertinent labs & imaging results that were available during my care of the patient were reviewed by me and considered in my medical decision making (see chart for details).     Zyrtec daily and eye drops for allergy symptoms provided. Right femur xray obtained today due to new pain to thigh. Notes reviewed from last ortho visit. No new injury to hip/thigh. Xray without acute findings. Without redness, swelling, tachycardia or risk factors for DVT. Exercises provided, naproxen twice a day. Follow up with PCP and/or ortho for recheck. Patient verbalized understanding and agreeable to plan.  Ambulatory out of clinic without difficulty.       Final Clinical Impressions(s) / UC Diagnoses   Final diagnoses:  Pain in right femur  Allergic conjunctivitis of both eyes  Chronic pain of right hip     Discharge Instructions     Please take  naproxen twice a day for pain, take with food. Use of daily zyrtec and twice a day eye drops for allergy symptoms. Light and regular activity.  Please continue to follow with your primary care provider and/or your orthopedics for recheck of symptoms.     ED Prescriptions    Medication Sig Dispense Auth. Provider   cetirizine (ZYRTEC) 10 MG tablet Take 1 tablet (10 mg total) by mouth daily. 30 tablet Augusto Gamble B, NP   ketotifen (ZADITOR) 0.025 % ophthalmic solution Place 1 drop into both eyes 2 (two) times daily. 5 mL Augusto Gamble B, NP   naproxen (NAPROSYN) 375 MG tablet Take 1 tablet (375 mg total) by mouth 2 (two) times daily. 20 tablet Zigmund Gottron, NP     Controlled Substance Prescriptions [place] Controlled Substance Registry consulted? Not Applicable       Zigmund Gottron, NP 01/27/18 1447

## 2018-01-27 NOTE — ED Triage Notes (Signed)
Pt here for itchy watery eyes she believes is related to allergies. She is also having right leg pain. Denies injury

## 2018-01-27 NOTE — Discharge Instructions (Signed)
Please take naproxen twice a day for pain, take with food. Use of daily zyrtec and twice a day eye drops for allergy symptoms. Light and regular activity.  Please continue to follow with your primary care provider and/or your orthopedics for recheck of symptoms.

## 2018-01-31 ENCOUNTER — Ambulatory Visit (HOSPITAL_COMMUNITY)
Admission: EM | Admit: 2018-01-31 | Discharge: 2018-01-31 | Disposition: A | Discharge status: Home / Self Care | Payer: Medicare HMO | Attending: Family Medicine | Admitting: Family Medicine

## 2018-01-31 ENCOUNTER — Telehealth (HOSPITAL_COMMUNITY): Payer: Self-pay | Admitting: Emergency Medicine

## 2018-01-31 ENCOUNTER — Encounter (HOSPITAL_COMMUNITY): Payer: Self-pay | Admitting: Family Medicine

## 2018-01-31 DIAGNOSIS — Z79899 Other long term (current) drug therapy: Secondary | ICD-10-CM | POA: Insufficient documentation

## 2018-01-31 DIAGNOSIS — Z794 Long term (current) use of insulin: Secondary | ICD-10-CM | POA: Insufficient documentation

## 2018-01-31 DIAGNOSIS — Z87891 Personal history of nicotine dependence: Secondary | ICD-10-CM | POA: Diagnosis not present

## 2018-01-31 DIAGNOSIS — I10 Essential (primary) hypertension: Secondary | ICD-10-CM | POA: Diagnosis not present

## 2018-01-31 DIAGNOSIS — L0291 Cutaneous abscess, unspecified: Secondary | ICD-10-CM | POA: Insufficient documentation

## 2018-01-31 DIAGNOSIS — E119 Type 2 diabetes mellitus without complications: Secondary | ICD-10-CM | POA: Diagnosis not present

## 2018-01-31 MED ORDER — FLUCONAZOLE 150 MG PO TABS
150.0000 mg | ORAL_TABLET | Freq: Once | ORAL | 0 refills | Status: AC
Start: 2018-01-31 — End: 2018-01-31

## 2018-01-31 MED ORDER — IBUPROFEN 800 MG PO TABS: 800.0000 mg | ORAL_TABLET | Freq: Three times a day (TID) | ORAL | 0 refills | Status: DC

## 2018-01-31 MED ORDER — SULFAMETHOXAZOLE-TRIMETHOPRIM 800-160 MG PO TABS: 1.0000 | ORAL_TABLET | Freq: Two times a day (BID) | ORAL | 0 refills | Status: AC

## 2018-01-31 NOTE — ED Triage Notes (Signed)
Pt here for abscess to the right upper leg. She also reports that she may have a yeast infection.

## 2018-01-31 NOTE — ED Provider Notes (Signed)
[place]    CSN: [id number] Arrival date & time: 01/31/18  1006     History   Chief Complaint Chief Complaint  Patient presents with  . Abscess    HPI Melissa Washington is a 32 y.o. female.   HPI  Patient  is here for an abscess on her thigh.  Is been present several days.  Is large and painful.  She was using warm compresses and try to get it to drain spontaneously.  It has not.  She is here for evaluation.  She has not had any fever chills or malaise. She is insulin-dependent diabetic.  She states she is prone to skin infections.  She is had abscesses before.  They usually have to be drained surgically. She states she also has recurring yeast infections.  She is requesting a Diflucan pill for recurrent yeast infection.  Past Medical History:  Diagnosis Date  . 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 Bacteremia associated with IV line ([place]) 05/01/2013   Port-a-cath infection, removed 8/6 by IR. Ecoli grew out in cultures. Sent home with Ceftriaxone to complete 14 day course.   . Bacteremia due to Escherichia coli 05/01/2013   Port-a-cath infection, removed 8/6 by IR. Ecoli grew out in cultures. Sent home with Ceftriaxone to complete 14 day course.   . Cholelithiasis 01/17/2012  . Collagen vascular disease ([place])   . Diabetes mellitus    type 2, insulin  . Headache(784.0)   . Hepatic steatosis 01/17/2012  . Hypertension   . Leukemia, acute monocytic, in remission ([place])   . Nephrolithiasis   . Obesity     Patient Active Problem List   Diagnosis Date Noted  . Acute pyelonephritis 04/07/2017  . Vaginitis and vulvovaginitis 10/09/2014  . Alopecia 06/26/2014  . Bilateral hip pain 06/19/2014  . Anxiety associated with depression 06/19/2014  . Allergic rhinitis 06/07/2014  . IUD complication ([place]) 74/04/1447  . Exposure to STD 06/07/2014  . Acute promyelocytic leukemia in remission ([place]) 11/19/2012  . Morbid obesity with BMI of 50.0-59.9, adult  ([place]) 01/20/2012  . DM (diabetes mellitus), type 2, uncontrolled ([place]) 12/30/2006  . HYPERTENSION, BENIGN ESSENTIAL 12/30/2006    Past Surgical History:  Procedure Laterality Date  . CHOLECYSTECTOMY  01/31/2012   Procedure: LAPAROSCOPIC CHOLECYSTECTOMY WITH INTRAOPERATIVE CHOLANGIOGRAM;  Surgeon: Marcello Moores A. Cornett, MD;  Location: [place];  Service: General;  Laterality: N/A;  . PORTACATH PLACEMENT     [place]  . portacath removal      OB History    Gravida  2   Para  2   Term  2   Preterm  0   AB  0   Living  2     SAB  0   TAB  0   Ectopic  0   Multiple  0   Live Births  2            Home Medications    Prior to Admission medications   Medication Sig Start Date End Date Taking? Authorizing Provider  blood glucose meter kit and supplies Dispense based on patient and insurance preference. Use up to four times daily as directed. (FOR ICD-9 250.00, 250.01). 04/11/17   Dessa Phi, DO  cetirizine (ZYRTEC) 10 MG tablet Take 1 tablet (10 mg total) by mouth daily. 01/27/18   Zigmund Gottron, NP  fluconazole (DIFLUCAN) 150 MG tablet Take 1 tablet (150 mg total) by mouth once for 1 dose. Repeat in one  week 01/31/18 01/31/18  Raylene Everts, MD  ibuprofen (ADVIL,MOTRIN) 800 MG tablet Take 1 tablet (800 mg total) by mouth 3 (three) times daily. 01/31/18   Raylene Everts, MD  Insulin Glargine (LANTUS) 100 UNIT/ML Solostar Pen Inject 30 Units into the skin daily at 10 pm. Patient taking differently: Inject 20 Units daily at 10 pm into the skin.  04/11/17 09/18/23  Dessa Phi, DO  Insulin Pen Needle 31G X 5 MM MISC Use with insulin, 4 times daily 04/11/17   Dessa Phi, DO  ketotifen (ZADITOR) 0.025 % ophthalmic solution Place 1 drop into both eyes 2 (two) times daily. 01/27/18   Zigmund Gottron, NP  levonorgestrel (MIRENA) 20 MCG/24HR IUD 1 each by Intrauterine route continuous.    [provider]  lisinopril (PRINIVIL,ZESTRIL) 5 MG tablet Take 1 tablet (5 mg  total) by mouth daily. 04/11/17   Dessa Phi, DO  metFORMIN (GLUCOPHAGE) 1000 MG tablet Take 1 tablet (1,000 mg total) by mouth 2 (two) times daily with a meal. Patient taking differently: Take 500 mg by mouth 2 (two) times daily with a meal.  04/11/17   Dessa Phi, DO  naproxen (NAPROSYN) 375 MG tablet Take 1 tablet (375 mg total) by mouth 2 (two) times daily. 01/27/18   Zigmund Gottron, NP  sulfamethoxazole-trimethoprim (BACTRIM DS,SEPTRA DS) 800-160 MG tablet Take 1 tablet by mouth 2 (two) times daily for 7 days. 01/31/18 02/07/18  Raylene Everts, MD    Family History Family History  Problem Relation Age of Onset  . Kidney disease Mother   . Hypertension Mother   . Epilepsy Mother   . Sleep apnea Mother   . Diabetes Father   . Kidney disease Maternal Grandmother   . Diabetes Maternal Grandmother     Social History Social History   Tobacco Use  . Smoking status: Former Research scientist (life sciences)  . Smokeless tobacco: Never Used  Substance Use Topics  . Alcohol use: No  . Drug use: No     Allergies   Ultram [tramadol hcl]   Review of Systems Review of Systems  Constitutional: Negative for chills.  HENT: Negative for congestion and dental problem.   Eyes: Negative for pain and visual disturbance.  Respiratory: Negative for cough and shortness of breath.   Cardiovascular: Negative for chest pain and palpitations.  Gastrointestinal: Negative for abdominal pain and vomiting.  Genitourinary: Negative for dysuria and frequency.  Musculoskeletal: Negative for arthralgias and back pain.  Skin: Positive for wound. Negative for color change and rash.  Neurological: Negative for seizures and headaches.  All other systems reviewed and are negative.    Physical Exam Triage Vital Signs ED Triage Vitals  Enc Vitals Group     BP 01/31/18 1032 119/80     Pulse Rate 01/31/18 1032 92     Resp 01/31/18 1032 18     Temp 01/31/18 1032 98.5 F (36.9 C)     Temp src --      SpO2 01/31/18  1032 97 %     Weight --      Height --      Head Circumference --      Peak Flow --      Pain Score 01/31/18 1030 10     Pain Loc --      Pain Edu? --      Excl. in [place]? --    No data found.  Updated Vital Signs BP 119/80   Pulse 92   Temp 98.5  F (36.9 C)   Resp 18   SpO2 97%       Physical Exam  Constitutional: She appears well-developed and well-nourished. No distress.  HENT:  Head: Normocephalic and atraumatic.  Neck: Neck supple.  Cardiovascular: Normal rate and regular rhythm.  Pulmonary/Chest: Effort normal and breath sounds normal.  Abdominal: Soft. She exhibits no distension. There is no tenderness.  Musculoskeletal: She exhibits no edema.  Neurological: She is alert.  Skin: Skin is warm and dry.  Right upper thigh has erythema that is approximately 10 cm across.  In the center there is an indurated area that 4 to 5 cm across.  The top of this is mildly fluctuant.  Patient desires I&D.  No regional adenopathy.  Psychiatric: She has a normal mood and affect.  Nursing note and vitals reviewed.  Time out Verbal consent Area prepped with Betadine Area anesthetized with 2 cc of lidocaine, 2% After adequate anesthesia a 2 cm stab wound was made into the center of the induration Scant purulence is obtained Cultures obtained Drain is placed Bandages in place Wound care reviewed  UC Treatments / Results  Labs (all labs ordered are listed, but only abnormal results are displayed) Labs Reviewed  AEROBIC CULTURE (SUPERFICIAL SPECIMEN)    EKG None  Radiology No results found.  Procedures Procedures (including critical care time)  Medications Ordered in UC Medications - No data to display  Initial Impression / Assessment and Plan / UC Course  I have reviewed the triage vital signs and the nursing notes.  Pertinent labs & imaging results that were available during my care of the patient were reviewed by me and considered in my medical decision making  (see chart for details).     Discussed recurring abscesses.  Discussed prepping skin with antibacterial soap on a daily basis.  This can be purchased over-the-counter.  Discussed that I am going to culture this to rule out MRSA. Discussed recurring BV/yeast infections.  Patient might benefit from taking a probiotic. Final Clinical Impressions(s) / UC Diagnoses   Final diagnoses:  Abscess     Discharge Instructions     Take the antibiotic 2 x a day Take 2 doses today Use the diflucan for yeast infection Repeat in one week Consider adding a probiotic with lactobacillus to your diet Ibuprofen for pain Return if worse or if fails to improve in a few days   ED Prescriptions    Medication Sig Dispense Auth. Provider   sulfamethoxazole-trimethoprim (BACTRIM DS,SEPTRA DS) 800-160 MG tablet Take 1 tablet by mouth 2 (two) times daily for 7 days. 14 tablet Raylene Everts, MD   fluconazole (DIFLUCAN) 150 MG tablet Take 1 tablet (150 mg total) by mouth once for 1 dose. Repeat in one week 2 tablet Raylene Everts, MD   ibuprofen (ADVIL,MOTRIN) 800 MG tablet Take 1 tablet (800 mg total) by mouth 3 (three) times daily. 21 tablet Raylene Everts, MD     Controlled Substance Prescriptions [place] Controlled Substance Registry consulted? Not Applicable   Raylene Everts, MD 01/31/18 1258

## 2018-01-31 NOTE — Discharge Instructions (Signed)
Take the antibiotic 2 x a day Take 2 doses today Use the diflucan for yeast infection Repeat in one week Consider adding a probiotic with lactobacillus to your diet Ibuprofen for pain Return if worse or if fails to improve in a few days

## 2018-02-02 ENCOUNTER — Emergency Department (HOSPITAL_COMMUNITY)
Admission: EM | Admit: 2018-02-02 | Discharge: 2018-02-02 | Disposition: A | Discharge status: Home / Self Care | Payer: Medicare HMO | Attending: Emergency Medicine | Admitting: Emergency Medicine

## 2018-02-02 ENCOUNTER — Other Ambulatory Visit: Payer: Self-pay

## 2018-02-02 ENCOUNTER — Encounter (HOSPITAL_COMMUNITY): Payer: Self-pay | Admitting: Emergency Medicine

## 2018-02-02 DIAGNOSIS — Z794 Long term (current) use of insulin: Secondary | ICD-10-CM | POA: Insufficient documentation

## 2018-02-02 DIAGNOSIS — I10 Essential (primary) hypertension: Secondary | ICD-10-CM | POA: Insufficient documentation

## 2018-02-02 DIAGNOSIS — E119 Type 2 diabetes mellitus without complications: Secondary | ICD-10-CM | POA: Diagnosis not present

## 2018-02-02 DIAGNOSIS — Z87891 Personal history of nicotine dependence: Secondary | ICD-10-CM | POA: Diagnosis not present

## 2018-02-02 DIAGNOSIS — Z79899 Other long term (current) drug therapy: Secondary | ICD-10-CM | POA: Insufficient documentation

## 2018-02-02 DIAGNOSIS — L02415 Cutaneous abscess of right lower limb: Secondary | ICD-10-CM | POA: Insufficient documentation

## 2018-02-02 DIAGNOSIS — L0291 Cutaneous abscess, unspecified: Secondary | ICD-10-CM

## 2018-02-02 MED ORDER — LIDOCAINE-EPINEPHRINE (PF) 2 %-1:200000 IJ SOLN
10.0000 mL | Freq: Once | INTRAMUSCULAR | Status: AC
  Administered 2018-02-02: 10 mL
  Filled 2018-02-02: qty 20

## 2018-02-02 NOTE — ED Triage Notes (Signed)
Pt presents with continued bleeding and R leg numbness to R groin d/t boil that was lanced at [facility] x 2 days ago; pt reports boil is larger (about the size of her palm); numbness to the R leg began today

## 2018-02-02 NOTE — ED Provider Notes (Addendum)
[facility] EMERGENCY DEPARTMENT Provider Note   CSN: [id number] Arrival date & time: 02/02/18  0011     History   Chief Complaint Chief Complaint  Patient presents with  . Abscess    HPI Melissa Washington is a 32 y.o. female.  Patient presents to the emergency department with a chief complaint of abscess.  She states that she was seen recently at an urgent care and had a boil drained on her right upper thigh.  She states in the past day she has had worsening of her symptoms.  She reports that the abscesses grown in size and in discomfort.  She denies any fevers chills.  Denies any nausea or vomiting.  She was prescribed an antibiotic, but has been unable to get it filled.  She states that the pain radiates down her leg now.  The history is provided by the patient. No language interpreter was used.    Past Medical History:  Diagnosis Date  . 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 Bacteremia associated with IV line ([place]) 05/01/2013   Port-a-cath infection, removed 8/6 by IR. Ecoli grew out in cultures. Sent home with Ceftriaxone to complete 14 day course.   . Bacteremia due to Escherichia coli 05/01/2013   Port-a-cath infection, removed 8/6 by IR. Ecoli grew out in cultures. Sent home with Ceftriaxone to complete 14 day course.   . Cholelithiasis 01/17/2012  . Collagen vascular disease ([place])   . Diabetes mellitus    type 2, insulin  . Headache(784.0)   . Hepatic steatosis 01/17/2012  . Hypertension   . Leukemia, acute monocytic, in remission ([place])   . Nephrolithiasis   . Obesity     Patient Active Problem List   Diagnosis Date Noted  . Acute pyelonephritis 04/07/2017  . Vaginitis and vulvovaginitis 10/09/2014  . Alopecia 06/26/2014  . Bilateral hip pain 06/19/2014  . Anxiety associated with depression 06/19/2014  . Allergic rhinitis 06/07/2014  . IUD complication ([place]) 63/89/3734  . Exposure to STD 06/07/2014  . Acute promyelocytic  leukemia in remission ([place]) 11/19/2012  . Morbid obesity with BMI of 50.0-59.9, adult ([place]) 01/20/2012  . DM (diabetes mellitus), type 2, uncontrolled ([place]) 12/30/2006  . HYPERTENSION, BENIGN ESSENTIAL 12/30/2006    Past Surgical History:  Procedure Laterality Date  . CHOLECYSTECTOMY  01/31/2012   Procedure: LAPAROSCOPIC CHOLECYSTECTOMY WITH INTRAOPERATIVE CHOLANGIOGRAM;  Surgeon: Marcello Moores A. Cornett, MD;  Location: [place];  Service: General;  Laterality: N/A;  . PORTACATH PLACEMENT     [place]  . portacath removal       OB History    Gravida  2   Para  2   Term  2   Preterm  0   AB  0   Living  2     SAB  0   TAB  0   Ectopic  0   Multiple  0   Live Births  2            Home Medications    Prior to Admission medications   Medication Sig Start Date End Date Taking? Authorizing Provider  blood glucose meter kit and supplies Dispense based on patient and insurance preference. Use up to four times daily as directed. (FOR ICD-9 250.00, 250.01). 04/11/17   Dessa Phi, DO  cetirizine (ZYRTEC) 10 MG tablet Take 1 tablet (10 mg total) by mouth daily. 01/27/18   Zigmund Gottron, NP  ibuprofen (ADVIL,MOTRIN) 800 MG tablet Take 1  tablet (800 mg total) by mouth 3 (three) times daily. 01/31/18   Raylene Everts, MD  Insulin Glargine (LANTUS) 100 UNIT/ML Solostar Pen Inject 30 Units into the skin daily at 10 pm. Patient taking differently: Inject 20 Units daily at 10 pm into the skin.  04/11/17 09/18/23  Dessa Phi, DO  Insulin Pen Needle 31G X 5 MM MISC Use with insulin, 4 times daily 04/11/17   Dessa Phi, DO  ketotifen (ZADITOR) 0.025 % ophthalmic solution Place 1 drop into both eyes 2 (two) times daily. 01/27/18   Zigmund Gottron, NP  levonorgestrel (MIRENA) 20 MCG/24HR IUD 1 each by Intrauterine route continuous.    [provider]  lisinopril (PRINIVIL,ZESTRIL) 5 MG tablet Take 1 tablet (5 mg total) by mouth daily. 04/11/17   Dessa Phi, DO  metFORMIN  (GLUCOPHAGE) 1000 MG tablet Take 1 tablet (1,000 mg total) by mouth 2 (two) times daily with a meal. Patient taking differently: Take 500 mg by mouth 2 (two) times daily with a meal.  04/11/17   Dessa Phi, DO  naproxen (NAPROSYN) 375 MG tablet Take 1 tablet (375 mg total) by mouth 2 (two) times daily. 01/27/18   Zigmund Gottron, NP  sulfamethoxazole-trimethoprim (BACTRIM DS,SEPTRA DS) 800-160 MG tablet Take 1 tablet by mouth 2 (two) times daily for 7 days. 01/31/18 02/07/18  Raylene Everts, MD    Family History Family History  Problem Relation Age of Onset  . Kidney disease Mother   . Hypertension Mother   . Epilepsy Mother   . Sleep apnea Mother   . Diabetes Father   . Kidney disease Maternal Grandmother   . Diabetes Maternal Grandmother     Social History Social History   Tobacco Use  . Smoking status: Former Research scientist (life sciences)  . Smokeless tobacco: Never Used  Substance Use Topics  . Alcohol use: No  . Drug use: No     Allergies   Ultram [tramadol hcl]   Review of Systems Review of Systems  All other systems reviewed and are negative.    Physical Exam Updated Vital Signs BP 119/71 (BP Location: Right Arm)   Pulse (!) 101   Temp 98.6 F (37 C) (Oral)   Resp 18   Ht _0  (1.575 m)   Wt 120.2 kg (265 lb)   SpO2 97%   BMI 48.47 kg/m   Physical Exam  Constitutional: She is oriented to person, place, and time. No distress.  HENT:  Head: Normocephalic and atraumatic.  Eyes: Pupils are equal, round, and reactive to light. Conjunctivae and EOM are normal.  Neck: No tracheal deviation present.  Cardiovascular: Normal rate.  Pulmonary/Chest: Effort normal. No respiratory distress.  Abdominal: Soft.  Musculoskeletal: Normal range of motion.  Neurological: She is alert and oriented to person, place, and time.  Skin: Skin is warm and dry. She is not diaphoretic.  4 x 4 centimeter erythematous region in the right upper medial thigh with previously drained abscess and  packing material in place  Psychiatric: Judgment normal.  Nursing note and vitals reviewed.    ED Treatments / Results  Labs (all labs ordered are listed, but only abnormal results are displayed) Labs Reviewed - No data to display  EKG None  Radiology No results found.  Procedures Procedures (including critical care time) EMERGENCY DEPARTMENT US SOFT TISSUE INTERPRETATION "Study: Limited Soft Tissue Ultrasound"  INDICATIONS: Soft tissue infection Multiple views of the body part were obtained in real-time with a multi-frequency linear probe  PERFORMED  BY: Myself IMAGES ARCHIVED?: Yes SIDE:Right  BODY PART:Lower extremity INTERPRETATION:  Abcess present and cellulitis present  INCISION AND DRAINAGE Performed by: Montine Circle Consent: Verbal consent obtained. Risks and benefits: risks, benefits and alternatives were discussed Type: abscess  Body area: right upper thigh  Anesthesia: local infiltration  Incision was made with a scalpel.  Local anesthetic: lidocaine 1% with epinephrine  Anesthetic total: 8 ml  Complexity: complex Blunt dissection to break up loculations  Drainage: purulent  Drainage amount: moderate  Packing material: 1/4 in iodoform gauze  Patient tolerance: Patient tolerated the procedure well with no immediate complications.     Medications Ordered in ED Medications  lidocaine-EPINEPHrine (XYLOCAINE W/EPI) 2 %-1:200000 (PF) injection 10 mL (has no administration in time range)     Initial Impression / Assessment and Plan / ED Course  I have reviewed the triage vital signs and the nursing notes.  Pertinent labs & imaging results that were available during my care of the patient were reviewed by me and considered in my medical decision making (see chart for details).     Patient with recently incised and drained abscess to right upper thigh.  She has had increase in size and pain since the procedure.  There is no more draining  from the incision that was made previously, however upon review of ultrasound it does look like there is a fluid collection deeper to it was drained previously.  We will attempt to incise and drain this tonight.  Abscess drained in ED without complications. She has a prescriptions for bactrim waiting at the pharmacy.  Encouraged her to get this filled today.  Final Clinical Impressions(s) / ED Diagnoses   Final diagnoses:  Abscess    ED Discharge Orders    None           Montine Circle, PA-C 02/02/18 [place], Delice Bison, DO 02/02/18 0425

## 2018-02-03 LAB — AEROBIC CULTURE  (SUPERFICIAL SPECIMEN): CULTURE: NORMAL

## 2018-02-03 LAB — AEROBIC CULTURE W GRAM STAIN (SUPERFICIAL SPECIMEN)

## 2018-04-18 ENCOUNTER — Ambulatory Visit: Payer: Medicare HMO | Admitting: Obstetrics & Gynecology

## 2018-04-18 DIAGNOSIS — Z30431 Encounter for routine checking of intrauterine contraceptive device: Secondary | ICD-10-CM

## 2018-04-18 NOTE — Progress Notes (Signed)
Pt here today for removal and reinsertion of IUD.  Per pt's chart pt had Mirena IUD inserted on 12/11/2013 so she is not due for removal until 12/12/2018.  I informed pt of this, pt stated that she did not realize that and would schedule an appt in 11/2018 to have another inserted.  Pt depression screen positive.  Pt stated that she wants to speak with Integrated Behavioral Health Clinician but needed to schedule for 04/21/18 because she did not have time today.  Provider notified and agreed.   I have reviewed the chart and agree with nursing staff's documentation of this patient's encounter.  Silas Sacramento, MD 04/18/2018 1:25 PM

## 2018-04-20 NOTE — BH Specialist Note (Deleted)
Integrated Behavioral Health Initial Visit  MRN: [id number] Name: Melissa Washington  Number of [facility] [place] Clinician visits:: 1/6 Session Start time: ***  Session End time: *** Total time: {IBH Total Time:21014050}  Type of Service: [place] Interpretor:No. Interpretor Name and Language: n/a   Warm Hand Off Completed.       SUBJECTIVE: Melissa Washington is a 32 y.o. female accompanied by {CHL AMB ACCOMPANIED RX:4585929244} Patient was referred by Silas Sacramento, MD for depression. Patient reports the following symptoms/concerns: Pt states her primary concern today is ***  Duration of problem: ***; Severity of problem: {Mild/Moderate/Severe:20260}  OBJECTIVE: Mood: {BHH MOOD:22306} and Affect: {BHH AFFECT:22307} Risk of harm to self or others: {CHL AMB BH Suicide Current Mental Status:21022748}  LIFE CONTEXT: Family and Social: *** School/Work: *** Self-Care: *** Life Changes: ***  GOALS ADDRESSED: Patient will: 1. Reduce symptoms of: {IBH Symptoms:21014056} 2. Increase knowledge and/or ability of: {IBH Patient Tools:21014057}  3. Demonstrate ability to: {IBH Goals:21014053}  INTERVENTIONS: Interventions utilized: {IBH Interventions:21014054}  Standardized Assessments completed: GAD-7 and PHQ 9  ASSESSMENT: Patient currently experiencing ***.   Patient may benefit from psychoeducation and brief therapeutic interventions regarding coping with symptoms of *** .  PLAN: 1. Follow up with behavioral health clinician on : *** 2. Behavioral recommendations:  -*** -*** -Read educational materials regarding coping with symptoms of ***  3. Referral(s): {IBH Referrals:21014055} 4. "From scale of 1-10, how likely are you to follow plan?": ***  Garlan Fair, LCSW  Depression screen [facility] 2/9 04/18/2018 03/29/2015 03/05/2015 10/09/2014 06/19/2014  Decreased Interest 1 0 0 0 0  Down, Depressed, Hopeless 2 0 0 0 0  PHQ -  2 Score 3 0 0 0 0  Altered sleeping 3 - - - -  Tired, decreased energy 3 - - - -  Change in appetite 2 - - - -  Feeling bad or failure about yourself  2 - - - -  Trouble concentrating 3 - - - -  Moving slowly or fidgety/restless 3 - - - -  Suicidal thoughts 1 - - - -  PHQ-9 Score 20 - - - -  Some recent data might be hidden   GAD 7 : Generalized Anxiety Score 04/18/2018  Nervous, Anxious, on Edge 3  Control/stop worrying 3  Worry too much - different things 3  Trouble relaxing 3  Restless 3  Easily annoyed or irritable 3  Afraid - awful might happen 1  Total GAD 7 Score 19

## 2018-04-21 ENCOUNTER — Institutional Professional Consult (permissible substitution): Payer: Medicare HMO

## 2018-09-28 ENCOUNTER — Ambulatory Visit (INDEPENDENT_AMBULATORY_CARE_PROVIDER_SITE_OTHER): Payer: Medicare HMO | Admitting: Orthopaedic Surgery

## 2018-09-28 ENCOUNTER — Encounter (INDEPENDENT_AMBULATORY_CARE_PROVIDER_SITE_OTHER): Payer: Self-pay | Admitting: Orthopaedic Surgery

## 2018-09-28 DIAGNOSIS — M7062 Trochanteric bursitis, left hip: Secondary | ICD-10-CM

## 2018-09-28 DIAGNOSIS — M25552 Pain in left hip: Secondary | ICD-10-CM

## 2018-09-28 DIAGNOSIS — M7061 Trochanteric bursitis, right hip: Secondary | ICD-10-CM | POA: Insufficient documentation

## 2018-09-28 DIAGNOSIS — M25551 Pain in right hip: Secondary | ICD-10-CM

## 2018-09-28 MED ORDER — LIDOCAINE HCL 1 % IJ SOLN
3.0000 mL | INTRAMUSCULAR | Status: AC | PRN
  Administered 2018-09-28: 3 mL

## 2018-09-28 MED ORDER — DICLOFENAC SODIUM 1 % TD GEL: 2.0000 g | Freq: Four times a day (QID) | TRANSDERMAL | 3 refills | Status: DC

## 2018-09-28 MED ORDER — METHYLPREDNISOLONE ACETATE 40 MG/ML IJ SUSP
40.0000 mg | INTRAMUSCULAR | Status: AC | PRN
  Administered 2018-09-28: 40 mg via INTRA_ARTICULAR

## 2018-09-28 NOTE — Progress Notes (Signed)
Office Visit Note   Patient: Melissa Washington           Date of Birth: 03-Mar-1986           MRN: [id number] Visit Date: 09/28/2018              Requested by: No referring provider defined for this encounter. PCP: System, Provider Not In   Assessment & Plan: Visit Diagnoses:  1. Pain of right hip joint   2. Pain of left hip joint   3. Trochanteric bursitis, right hip   4. Trochanteric bursitis, left hip     Plan: From my standpoint I do feel this is significant trochanteric bursitis of both hips.  I showed her stretching exercises to try.  I did place a steroid injection per her request in both trochanteric areas that gave her significant relief.  We will see if insurance will cover Voltaren gel for her.  I do feel that she is a candidate for at least a few visits with outpatient physical therapy they can help shoulder some other stretching exercises and other modalities to try to help with her bilateral hip trochanteric bursitis and hip pain.  All question concerns were answered and addressed.  Follow will be as needed.  Follow-Up Instructions: Return if symptoms worsen or fail to improve.   Orders:  Orders Placed This Encounter  Procedures  . Large Joint Inj  . Large Joint Inj   Meds ordered this encounter  Medications  . diclofenac sodium (VOLTAREN) 1 % GEL    Sig: Apply 2 g topically 4 (four) times daily.    Dispense:  100 g    Refill:  3      Procedures: Large Joint Inj: R greater trochanter on 09/28/2018 10:46 AM Indications: pain and diagnostic evaluation Details: 22 G 1.5 in needle, lateral approach  Arthrogram: No  Medications: 3 mL lidocaine 1 %; 40 mg methylPREDNISolone acetate 40 MG/ML Outcome: tolerated well, no immediate complications Procedure, treatment alternatives, risks and benefits explained, specific risks discussed. Consent was given by the patient. Immediately prior to procedure a time out was called to verify the correct patient, procedure,  equipment, support staff and site/side marked as required. Patient was prepped and draped in the usual sterile fashion.   Large Joint Inj: L greater trochanter on 09/28/2018 10:46 AM Indications: pain and diagnostic evaluation Details: 22 G 1.5 in needle, lateral approach  Arthrogram: No  Medications: 3 mL lidocaine 1 %; 40 mg methylPREDNISolone acetate 40 MG/ML Outcome: tolerated well, no immediate complications Procedure, treatment alternatives, risks and benefits explained, specific risks discussed. Consent was given by the patient. Immediately prior to procedure a time out was called to verify the correct patient, procedure, equipment, support staff and site/side marked as required. Patient was prepped and draped in the usual sterile fashion.       Clinical Data: No additional findings.   Subjective: Chief Complaint  Patient presents with  . Right Hip - Pain  . Left Hip - Pain  The patient is a very pleasant 33 year old female that I have seen in 2018.  She has bilateral hip pain and points to the trochanteric area of both the wrist as a source of pain.  Back then I provide injection of both hips.  She said the pain is returned and she does get swelling.  She still denies any groin pain.  She is a very active individual.  She does deal with significant obesity.  She stays active.  She is not a diabetic.  She is worked on activity modification and weight loss.  She denies any radicular symptoms or back pain.  HPI  Review of Systems She currently denies any headache, chest pain, shortness of breath, fever, chills, nausea, vomiting.  Objective: Vital Signs: There were no vitals taken for this visit.  Physical Exam She is alert and orient x3 and in no acute distress Ortho Exam She gets up on the exam table easily.  She has fluid and full range of motion of both hips with no pain in the groin.  She has significant pain to palpation over the trochanteric area of both  sides. Specialty Comments:  No specialty comments available.  Imaging: No results found.  X-rays of her pelvis and hips from 2018 and her right hip from 2019 shows no evidence of fracture or any significant arthritic changes at all. PMFS History: Patient Active Problem List   Diagnosis Date Noted  . Trochanteric bursitis, right hip 09/28/2018  . Trochanteric bursitis, left hip 09/28/2018  . Acute pyelonephritis 04/07/2017  . Vaginitis and vulvovaginitis 10/09/2014  . Alopecia 06/26/2014  . Bilateral hip pain 06/19/2014  . Anxiety associated with depression 06/19/2014  . Allergic rhinitis 06/07/2014  . IUD complication ([place]) 62/22/9798  . Exposure to STD 06/07/2014  . 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 Morbid obesity with BMI of 50.0-59.9, adult ([place]) 01/20/2012  . DM (diabetes mellitus), type 2, uncontrolled ([place]) 12/30/2006  . HYPERTENSION, BENIGN ESSENTIAL 12/30/2006   Past Medical History:  Diagnosis Date  . 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 Bacteremia associated with IV line ([place]) 05/01/2013   Port-a-cath infection, removed 8/6 by IR. Ecoli grew out in cultures. Sent home with Ceftriaxone to complete 14 day course.   . Bacteremia due to Escherichia coli 05/01/2013   Port-a-cath infection, removed 8/6 by IR. Ecoli grew out in cultures. Sent home with Ceftriaxone to complete 14 day course.   . Cholelithiasis 01/17/2012  . Collagen vascular disease ([place])   . Diabetes mellitus    type 2, insulin  . Headache(784.0)   . Hepatic steatosis 01/17/2012  . Hypertension   . Leukemia, acute monocytic, in remission ([place])   . Nephrolithiasis   . Obesity     Family History  Problem Relation Age of Onset  . Kidney disease Mother   . Hypertension Mother   . Epilepsy Mother   . Sleep apnea Mother   . Diabetes Father   . Kidney disease Maternal Grandmother   . Diabetes Maternal Grandmother     Past Surgical History:  Procedure  Laterality Date  . CHOLECYSTECTOMY  01/31/2012   Procedure: LAPAROSCOPIC CHOLECYSTECTOMY WITH INTRAOPERATIVE CHOLANGIOGRAM;  Surgeon: Marcello Moores A. Cornett, MD;  Location: [facility];  Service: General;  Laterality: N/A;  . PORTACATH PLACEMENT     [place]  . portacath removal     Social History   Occupational History  . Occupation: Unemployed    Employer: [employer]  . Occupation: Personal care aide in past  Tobacco Use  . Smoking status: Former Research scientist (life sciences)  . Smokeless tobacco: Never Used  Substance and Sexual Activity  . Alcohol use: No  . Drug use: No  . Sexual activity: Yes    Partners: Male    Birth control/protection: None

## 2018-09-29 ENCOUNTER — Other Ambulatory Visit (INDEPENDENT_AMBULATORY_CARE_PROVIDER_SITE_OTHER): Payer: Self-pay

## 2018-09-29 DIAGNOSIS — M7072 Other bursitis of hip, left hip: Secondary | ICD-10-CM

## 2018-09-29 DIAGNOSIS — M7071 Other bursitis of hip, right hip: Secondary | ICD-10-CM

## 2018-11-10 ENCOUNTER — Encounter: Payer: Self-pay | Admitting: Obstetrics & Gynecology

## 2018-11-10 ENCOUNTER — Other Ambulatory Visit (HOSPITAL_COMMUNITY)
Admission: RE | Admit: 2018-11-10 | Discharge: 2018-11-10 | Disposition: A | Discharge status: Home / Self Care | Payer: Medicare HMO | Source: Ambulatory Visit | Attending: Obstetrics & Gynecology | Admitting: Obstetrics & Gynecology

## 2018-11-10 ENCOUNTER — Ambulatory Visit (INDEPENDENT_AMBULATORY_CARE_PROVIDER_SITE_OTHER): Payer: Medicare HMO | Admitting: Obstetrics & Gynecology

## 2018-11-10 VITALS — BP 119/68 | HR 85 | Ht 61.0 in | Wt 260.0 lb

## 2018-11-10 DIAGNOSIS — Z01419 Encounter for gynecological examination (general) (routine) without abnormal findings: Secondary | ICD-10-CM | POA: Diagnosis present

## 2018-11-10 DIAGNOSIS — Z113 Encounter for screening for infections with a predominantly sexual mode of transmission: Secondary | ICD-10-CM

## 2018-11-10 DIAGNOSIS — Z30432 Encounter for removal of intrauterine contraceptive device: Secondary | ICD-10-CM

## 2018-11-10 DIAGNOSIS — Z30430 Encounter for insertion of intrauterine contraceptive device: Secondary | ICD-10-CM

## 2018-11-10 MED ORDER — LEVONORGESTREL 19.5 MCG/DAY IU IUD
INTRAUTERINE_SYSTEM | Freq: Once | INTRAUTERINE | Status: AC
  Administered 2018-11-10: 12:00:00 1 via INTRAUTERINE

## 2018-11-10 NOTE — Progress Notes (Signed)
Subjective:     Melissa Washington is a 33 y.o. female here for a routine exam.  Current complaints: Pt requests IUD removal and reinsertion.  Pt denies currently complaints. She is s/p Leukemia. Pt denies FH of female malignancies.    Gynecologic History No LMP recorded. (Menstrual status: IUD). Contraception: IUD Last Pap: 05/03/2012. Results were: normal Last mammogram: n/a   Obstetric History OB History  Gravida Para Term Preterm AB Living  2 2 2  0 0 2  SAB TAB Ectopic Multiple Live Births  0 0 0 0 2    # Outcome Date GA Lbr Len/2nd Weight Sex Delivery Anes PTL Lv  2 Term 09/02/13 [redacted]w[redacted]d  7 lb 14.8 oz (3.595 kg) F Vag-Spont EPI  LIV  1 Term 05/06/04 [redacted]w[redacted]d  5 lb 14 oz (2.665 kg) F Vag-Spont   LIV     Birth Comments: no prenatal care   The following portions of the patient's history were reviewed and updated as appropriate: allergies, current medications, past family history, past medical history, past social history, past surgical history and problem list.  Review of Systems Pertinent items are noted in HPI.    Objective:  BP 119/68   Pulse 85   Ht 5\' 1"  (1.549 m)   Wt 260 lb (117.9 kg)   BMI 49.13 kg/m  General Appearance:    Alert, cooperative, no distress, appears stated age  Head:    Normocephalic, without obvious abnormality, atraumatic  Eyes:    conjunctiva/corneas clear, EOM's intact, both eyes  Ears:    Normal external ear canals, both ears  Nose:   Nares normal, septum midline, mucosa normal, no drainage    or sinus tenderness  Throat:   Lips, mucosa, and tongue normal; teeth and gums normal  Neck:   Supple, symmetrical, trachea midline, no adenopathy;    thyroid:  no enlargement/tenderness/nodules  Back:     Symmetric, no curvature, ROM normal, no CVA tenderness  Lungs:     Clear to auscultation bilaterally, respirations unlabored  Chest Wall:    No tenderness or deformity   Heart:    Regular rate and rhythm, S1 and S2 normal, no murmur, rub   or gallop   Breast Exam:    No tenderness, masses, or nipple abnormality-pt with pendulous breasts. There is a rash with satellite lesion along the bra line c/w yeast   Abdomen:     Soft, non-tender, bowel sounds active all four quadrants,    no masses, no organomegaly  Genitalia:    Normal female without lesion, discharge or tenderness     Extremities:   Extremities normal, atraumatic, no cyanosis or edema  Pulses:   2+ and symmetric all extremities  Skin:   Skin color, texture, turgor normal, no rashes or lesions      GYNECOLOGY CLINIC PROCEDURE NOTE  Patient was in the dorsal lithotomy position, normal external genitalia was noted.  A speculum was placed in the patient's vagina, normal discharge was noted, no lesions. The multiparous cervix was visualized, no lesions, no abnormal discharge;  and the cervix was swabbed with Betadine using scopettes. The strings of the IUD were grasped and pulled using ring forceps.  The IUD was successfully removed in its entirety.  IUD Insertion Procedure Note Patient identified, informed consent performed.  Discussed risks of irregular bleeding, cramping, infection, malpositioning or misplacement of the IUD outside the uterus which may require further procedures. Time out was performed.  Urine pregnancy test negative.  Speculum placed in  the vagina.  Cervix visualized.  Cleaned with Betadine x 2.  Grasped anteriorly with a single tooth tenaculum.  Uterus sounded to 8 cm.  Liletta IUD placed per manufacturer's recommendations.  Strings trimmed to 3 cm. Tenaculum was removed, good hemostasis noted.  Patient tolerated procedure well.   Assessment:    Healthy female exam.   PAP performed STI screen  Yeast along bra lines Contraception management - LnIUD removed and new LnIUD reinserted   Plan:  F/u PAP STI screen: f/u cx and labs: HIV, RPR, Hep B&C  Lotrimin OTC for yeast  Patient was given post-procedure instructions.  Patient was asked to follow up in 4  weeks for IUD check.  Dossie Swor L. Harraway-Smith, M.D., Cherlynn June

## 2018-11-10 NOTE — Addendum Note (Signed)
Addended by: Shelly Coss on: 11/10/2018 12:07 PM   Modules accepted: Orders

## 2018-11-10 NOTE — Patient Instructions (Signed)
Intrauterine Device Insertion, Care After    This sheet gives you information about how to care for yourself after your procedure. Your health care provider may also give you more specific instructions. If you have problems or questions, contact your health care provider.  What can I expect after the procedure?  After the procedure, it is common to have:   Cramps and pain in the abdomen.   Light bleeding (spotting) or heavier bleeding that is like your menstrual period. This may last for up to a few days.   Lower back pain.   Dizziness.   Headaches.   Nausea.  Follow these instructions at home:   Before resuming sexual activity, check to make sure that you can feel the IUD string(s). You should be able to feel the end of the string(s) below the opening of your cervix. If your IUD string is in place, you may resume sexual activity.  ? If you had a hormonal IUD inserted more than 7 days after your most recent period started, you will need to use a backup method of birth control for 7 days after IUD insertion. Ask your health care provider whether this applies to you.   Continue to check that the IUD is still in place by feeling for the string(s) after every menstrual period, or once a month.   Take over-the-counter and prescription medicines only as told by your health care provider.   Do not drive or use heavy machinery while taking prescription pain medicine.   Keep all follow-up visits as told by your health care provider. This is important.  Contact a health care provider if:   You have bleeding that is heavier or lasts longer than a normal menstrual cycle.   You have a fever.   You have cramps or abdominal pain that get worse or do not get better with medicine.   You develop abdominal pain that is new or is not in the same area of earlier cramping and pain.   You feel lightheaded or weak.   You have abnormal or bad-smelling discharge from your vagina.   You have pain during sexual  activity.   You have any of the following problems with your IUD string(s):  ? The string bothers or hurts you or your sexual partner.  ? You cannot feel the string.  ? The string has gotten longer.   You can feel the IUD in your vagina.   You think you may be pregnant, or you miss your menstrual period.   You think you may have an STI (sexually transmitted infection).  Get help right away if:   You have flu-like symptoms.   You have a fever and chills.   You can feel that your IUD has slipped out of place.  Summary   After the procedure, it is common to have cramps and pain in the abdomen. It is also common to have light bleeding (spotting) or heavier bleeding that is like your menstrual period.   Continue to check that the IUD is still in place by feeling for the string(s) after every menstrual period, or once a month.   Keep all follow-up visits as told by your health care provider. This is important.   Contact your health care provider if you have problems with your IUD string(s), such as the string getting longer or bothering you or your sexual partner.  This information is not intended to replace advice given to you by your health care provider. Make   Melissa trel) is a contraceptive (birth control) device. The device is placed inside the uterus by a healthcare professional. It is used to prevent pregnancy. This device can also be used to treat heavy bleeding that occurs during your period. This medicine may be used for other purposes; ask your health care provider or pharmacist if you have questions. COMMON BRAND NAME(S): Kyleena, LILETTA, Mirena, Skyla What should I tell my health  care provider before I take this medicine? They need to know if you have any of these conditions: -abnormal Pap smear -cancer of the breast, uterus, or cervix -diabetes -endometritis -genital or pelvic infection now or in the past -have more than one sexual partner or your partner has more than one partner -heart disease -history of an ectopic or tubal pregnancy -immune system problems -IUD in place -liver disease or tumor -problems with blood clots or take blood-thinners -seizures -use intravenous drugs -uterus of unusual shape -vaginal bleeding that has not been explained -an unusual or allergic reaction to levonorgestrel, other hormones, silicone, or polyethylene, medicines, foods, dyes, or preservatives -pregnant or trying to get pregnant -breast-feeding How should I use this medicine? This device is placed inside the uterus by a health care professional. Talk to your pediatrician regarding the use of this medicine in children. Special care may be needed. Overdosage: If you think you have taken too much of this medicine contact a poison control center or emergency room at once. NOTE: This medicine is only for you. Do not share this medicine with others. What if I miss a dose? This does not apply. Depending on the brand of device you have inserted, the device will need to be replaced every 3 to 5 years if you wish to continue using this type of birth control. What may interact with this medicine? Do not take this medicine with any of the following medications: -amprenavir -bosentan -fosamprenavir This medicine may also interact with the following medications: -aprepitant -armodafinil -barbiturate medicines for inducing sleep or treating seizures -bexarotene -boceprevir -griseofulvin -medicines to treat seizures like carbamazepine, ethotoin, felbamate, oxcarbazepine, phenytoin, topiramate -modafinil -pioglitazone -rifabutin -rifampin -rifapentine -some medicines to  treat HIV infection like atazanavir, efavirenz, indinavir, lopinavir, nelfinavir, tipranavir, ritonavir -St. John's wort -warfarin This list may not describe all possible interactions. Give your health care provider a list of all the medicines, herbs, non-prescription drugs, or dietary supplements you use. Also tell them if you smoke, drink alcohol, or use illegal drugs. Some items may interact with your medicine. What should I watch for while using this medicine? Visit your doctor or health care professional for regular check ups. See your doctor if you or your partner has sexual contact with others, becomes HIV positive, or gets a sexual transmitted disease. This product does not protect you against HIV infection (AIDS) or other sexually transmitted diseases. You can check the placement of the IUD yourself by reaching up to the top of your vagina with clean fingers to feel the threads. Do not pull on the threads. It is a good habit to check placement after each menstrual period. Call your doctor right away if you feel more of the IUD than just the threads or if you cannot feel the threads at all. The IUD may come out by itself. You may become pregnant if the device comes out. If you notice that the IUD has come out use a backup birth control method like condoms and call your health care provider. Using tampons will not change the position of the   the top of your vagina with clean fingers to feel the threads. Do not pull on the threads. It is a good habit to check placement after each menstrual period. Call your doctor right away if you feel more of the IUD than just the threads or if you cannot feel the threads at all.  The IUD may come out by itself. You may become pregnant if the device comes out. If you notice that the IUD has come out use a backup birth control method like condoms and call your health care provider.  Using tampons will not change the position of the IUD and are okay to use during your period.  This IUD can be safely scanned with magnetic resonance imaging (MRI) only under specific conditions. Before you have an MRI, tell your healthcare provider that you have an IUD in place, and which type of IUD you have in place.  What side effects may I notice from receiving this medicine?  Side effects that you should report to your doctor or health care professional as soon as possible:  -allergic reactions like skin rash, itching or hives, swelling of the face, lips, or tongue  -fever, flu-like symptoms  -genital  sores  -high blood pressure  -no menstrual period for 6 weeks during use  -pain, swelling, warmth in the leg  -pelvic pain or tenderness  -severe or sudden headache  -signs of pregnancy  -stomach cramping  -sudden shortness of breath  -trouble with balance, talking, or walking  -unusual vaginal bleeding, discharge  -yellowing of the eyes or skin  Side effects that usually do not require medical attention (report to your doctor or health care professional if they continue or are bothersome):  -acne  -breast pain  -change in sex drive or performance  -changes in weight  -cramping, dizziness, or faintness while the device is being inserted  -headache  -irregular menstrual bleeding within first 3 to 6 months of use  -nausea  This list may not describe all possible side effects. Call your doctor for medical advice about side effects. You may report side effects to FDA at 1-800-FDA-1088.  Where should I keep my medicine?  This does not apply.  NOTE: This sheet is a summary. It may not cover all possible information. If you have questions about this medicine, talk to your doctor, pharmacist, or health care provider.   2019 Elsevier/Gold Standard (2016-06-19 14:14:56)

## 2018-11-10 NOTE — Progress Notes (Signed)
Patient had elevated phq 9 & gad7- will return to see Roselyn Reef another day.

## 2018-11-11 LAB — HEPATITIS C ANTIBODY: Hep C Virus Ab: <0.1 s/co ratio (ref 0.0–0.9)

## 2018-11-11 LAB — HEPATITIS B SURFACE ANTIGEN: Hepatitis B Surface Ag: NEGATIVE

## 2018-11-11 LAB — RPR: RPR: NONREACTIVE

## 2018-11-11 LAB — HIV ANTIBODY (ROUTINE TESTING W REFLEX): HIV Screen 4th Generation wRfx: NONREACTIVE

## 2018-11-15 LAB — CYTOLOGY - PAP
CHLAMYDIA, DNA PROBE: NEGATIVE
DIAGNOSIS: NEGATIVE
HPV: NOT DETECTED
NEISSERIA GONORRHEA: NEGATIVE
TRICH (WINDOWPATH): NEGATIVE

## 2018-12-07 ENCOUNTER — Telehealth: Payer: Self-pay | Admitting: *Deleted

## 2018-12-07 NOTE — Telephone Encounter (Signed)
Called pt to notify of change in check in process. Pt denies any s/s of covid-19, travel or exposure to covid-19.  Pt verbalized understanding and stated she will attend her appointment.

## 2018-12-08 ENCOUNTER — Ambulatory Visit: Payer: Medicare HMO | Admitting: Obstetrics & Gynecology

## 2018-12-16 ENCOUNTER — Encounter: Payer: Self-pay | Admitting: *Deleted

## 2019-01-31 ENCOUNTER — Other Ambulatory Visit: Payer: Self-pay

## 2019-01-31 ENCOUNTER — Encounter (HOSPITAL_COMMUNITY): Payer: Self-pay

## 2019-01-31 ENCOUNTER — Ambulatory Visit (HOSPITAL_COMMUNITY)
Admission: EM | Admit: 2019-01-31 | Discharge: 2019-01-31 | Disposition: A | Discharge status: Home / Self Care | Payer: Medicare HMO | Attending: Family Medicine | Admitting: Family Medicine

## 2019-01-31 DIAGNOSIS — Z3202 Encounter for pregnancy test, result negative: Secondary | ICD-10-CM | POA: Diagnosis not present

## 2019-01-31 DIAGNOSIS — L0291 Cutaneous abscess, unspecified: Secondary | ICD-10-CM | POA: Insufficient documentation

## 2019-01-31 DIAGNOSIS — N3090 Cystitis, unspecified without hematuria: Secondary | ICD-10-CM | POA: Insufficient documentation

## 2019-01-31 LAB — POCT URINALYSIS DIP (DEVICE)
Bilirubin Urine: NEGATIVE
Glucose, UA: 500 mg/dL — AB
Nitrite: NEGATIVE
Protein, ur: 30 mg/dL — AB
Specific Gravity, Urine: 1.025 (ref 1.005–1.030)
Urobilinogen, UA: 0.2 mg/dL (ref 0.0–1.0)
pH: 5.5 (ref 5.0–8.0)

## 2019-01-31 LAB — POCT PREGNANCY, URINE: Preg Test, Ur: NEGATIVE

## 2019-01-31 MED ORDER — CEPHALEXIN 500 MG PO CAPS: 500.0000 mg | ORAL_CAPSULE | Freq: Four times a day (QID) | ORAL | 0 refills | Status: DC

## 2019-01-31 NOTE — ED Provider Notes (Signed)
[place]    CSN: [id number] Arrival date & time: 01/31/19  1003     History   Chief Complaint Chief Complaint  Patient presents with  . Abscess  . pelvis pain    HPI Melissa Washington is a 33 y.o. female.   Patient is a 33 year old female that presents today with multiple complaints.  First complaint being abscess under the right breast.  This is been present and worsening over the last 2 days.  She has had some erythema and pain.  Denies any draining.  She does have a history of abscesses.  Denies any history of MRSA.  No fevers, chills or body aches.  She is also complaining of intermittent pelvic pain.  This is also been present, waxing waning over the last 2 days.  Denies any associated dysuria, hematuria or urinary frequency.  Denies any vaginal discharge.  She is currently sexually active with one partner, unprotected.  She does have an IUD that was recently placed about 2 months ago.  She does not have menstrual cycles.  Denies any concern today for STDs.  Last bowel movement was yesterday and normal.  No vomiting or diarrhea.  ROS per HPI      Past Medical History:  Diagnosis Date  . 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 Bacteremia associated with IV line ([place]) 05/01/2013   Port-a-cath infection, removed 8/6 by IR. Ecoli grew out in cultures. Sent home with Ceftriaxone to complete 14 day course.   . Bacteremia due to Escherichia coli 05/01/2013   Port-a-cath infection, removed 8/6 by IR. Ecoli grew out in cultures. Sent home with Ceftriaxone to complete 14 day course.   . Cholelithiasis 01/17/2012  . Collagen vascular disease ([place])   . Diabetes mellitus    type 2, insulin  . Headache(784.0)   . Hepatic steatosis 01/17/2012  . Hypertension   . Leukemia, acute monocytic, in remission ([place])   . Nephrolithiasis   . Obesity     Patient Active Problem List   Diagnosis Date Noted  . Trochanteric bursitis, right hip 09/28/2018  .  Trochanteric bursitis, left hip 09/28/2018  . Acute pyelonephritis 04/07/2017  . Vaginitis and vulvovaginitis 10/09/2014  . Alopecia 06/26/2014  . Bilateral hip pain 06/19/2014  . Anxiety associated with depression 06/19/2014  . Allergic rhinitis 06/07/2014  . IUD complication ([place]) 07/18/2535  . Exposure to STD 06/07/2014  . 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 Morbid obesity with BMI of 50.0-59.9, adult ([place]) 01/20/2012  . DM (diabetes mellitus), type 2, uncontrolled ([place]) 12/30/2006  . HYPERTENSION, BENIGN ESSENTIAL 12/30/2006    Past Surgical History:  Procedure Laterality Date  . CHOLECYSTECTOMY  01/31/2012   Procedure: LAPAROSCOPIC CHOLECYSTECTOMY WITH INTRAOPERATIVE CHOLANGIOGRAM;  Surgeon: Marcello Moores A. Cornett, MD;  Location: [facility];  Service: General;  Laterality: N/A;  . PORTACATH PLACEMENT     [place]  . portacath removal      OB History    Gravida  2   Para  2   Term  2   Preterm  0   AB  0   Living  2     SAB  0   TAB  0   Ectopic  0   Multiple  0   Live Births  2            Home Medications    Prior to Admission medications   Medication Sig Start Date End Date Taking? Authorizing Provider  blood glucose meter kit and supplies Dispense based on patient and insurance preference. Use up to four times daily as directed. (FOR ICD-9 250.00, 250.01). 04/11/17   Dessa Phi, DO  cephALEXin (KEFLEX) 500 MG capsule Take 1 capsule (500 mg total) by mouth 4 (four) times daily. 01/31/19   Loura Halt A, NP  cetirizine (ZYRTEC) 10 MG tablet Take 1 tablet (10 mg total) by mouth daily. 01/27/18   Augusto Gamble B, NP  diclofenac sodium (VOLTAREN) 1 % GEL Apply 2 g topically 4 (four) times daily. Patient not taking: Reported on 11/10/2018 09/28/18   Mcarthur Rossetti, MD  Insulin Glargine (LANTUS) 100 UNIT/ML Solostar Pen Inject 30 Units into the skin daily at 10 pm. Patient taking differently: Inject 20 Units daily at 10 pm into the  skin.  04/11/17 09/18/23  Dessa Phi, DO  Insulin Pen Needle 31G X 5 MM MISC Use with insulin, 4 times daily 04/11/17   Dessa Phi, DO  levonorgestrel (MIRENA) 20 MCG/24HR IUD 1 each by Intrauterine route continuous.    [provider]  lisinopril (PRINIVIL,ZESTRIL) 5 MG tablet Take 1 tablet (5 mg total) by mouth daily. 04/11/17   Dessa Phi, DO  metFORMIN (GLUCOPHAGE) 1000 MG tablet Take 1 tablet (1,000 mg total) by mouth 2 (two) times daily with a meal. Patient taking differently: Take 500 mg by mouth 2 (two) times daily with a meal.  04/11/17   Dessa Phi, DO    Family History Family History  Problem Relation Age of Onset  . Kidney disease Mother   . Hypertension Mother   . Epilepsy Mother   . Sleep apnea Mother   . Diabetes Father   . Kidney disease Maternal Grandmother   . Diabetes Maternal Grandmother     Social History Social History   Tobacco Use  . Smoking status: Former Research scientist (life sciences)  . Smokeless tobacco: Never Used  Substance Use Topics  . Alcohol use: No  . Drug use: No     Allergies   Ultram [tramadol hcl]   Review of Systems Review of Systems   Physical Exam Triage Vital Signs ED Triage Vitals  Enc Vitals Group     BP 01/31/19 1017 122/80     Pulse Rate 01/31/19 1017 (!) 101     Resp 01/31/19 1017 16     Temp 01/31/19 1017 98.8 F (37.1 C)     Temp Source 01/31/19 1017 Oral     SpO2 01/31/19 1017 97 %     Weight 01/31/19 1019 270 lb (122.5 kg)     Height --      Head Circumference --      Peak Flow --      Pain Score 01/31/19 1017 8     Pain Loc --      Pain Edu? --      Excl. in [place]? --    No data found.  Updated Vital Signs BP 122/80 (BP Location: Right Arm)   Pulse (!) 101   Temp 98.8 F (37.1 C) (Oral)   Resp 16   Wt 270 lb (122.5 kg)   SpO2 97%   BMI 51.02 kg/m   Visual Acuity Right Eye Distance:   Left Eye Distance:   Bilateral Distance:    Right Eye Near:   Left Eye Near:    Bilateral Near:      Physical Exam Vitals signs and nursing note reviewed.  Constitutional:      General: She is not in acute distress.  Appearance: Normal appearance. She is obese. She is not ill-appearing, toxic-appearing or diaphoretic.  HENT:     Head: Normocephalic and atraumatic.     Nose: Nose normal.     Mouth/Throat:     Pharynx: Oropharynx is clear.  Eyes:     Conjunctiva/sclera: Conjunctivae normal.  Neck:     Musculoskeletal: Normal range of motion.  Pulmonary:     Effort: Pulmonary effort is normal.  Chest:       Comments: Abscess just under the right breast Approx 5 to 6 cm erythema and induration No fluctuance or drainage. mildly tender to touch.  Abdominal:     General: Abdomen is protuberant. Bowel sounds are normal. There is no abdominal bruit.     Palpations: Abdomen is soft.     Tenderness: There is abdominal tenderness in the suprapubic area. There is no right CVA tenderness, left CVA tenderness, guarding or rebound. Negative signs include Murphy's sign.     Hernia: No hernia is present.  Musculoskeletal: Normal range of motion.  Skin:    General: Skin is warm and dry.  Neurological:     Mental Status: She is alert.  Psychiatric:        Mood and Affect: Mood normal.      UC Treatments / Results  Labs (all labs ordered are listed, but only abnormal results are displayed) Labs Reviewed  POCT URINALYSIS DIP (DEVICE) - Abnormal; Notable for the following components:      Result Value   Glucose, UA 500 (*)    Ketones, ur TRACE (*)    Hgb urine dipstick TRACE (*)    Protein, ur 30 (*)    Leukocytes,Ua SMALL (*)    All other components within normal limits  URINE CULTURE  POC URINE PREG, ED  POCT PREGNANCY, URINE  CERVICOVAGINAL ANCILLARY ONLY    EKG None  Radiology No results found.  Procedures Procedures (including critical care time)  Medications Ordered in UC Medications - No data to display  Initial Impression / Assessment and Plan / UC Course  I  have reviewed the triage vital signs and the nursing notes.  Pertinent labs & imaging results that were available during my care of the patient were reviewed by me and considered in my medical decision making (see chart for details).     Pt with abscess to right rib/upper abdominal area.  No indication for I&D today.  Will treat with keflex. Instructed to continue the warm compresses.  If becomes worse and is able to be drained she can return for I&D.  Urine revealed lueks and blood. Was cloudy in color.  We will treat for UTI pending culture.  Keflex should cover for this also.  Sent self swab for testing.  instructed that if her symptoms continue she will need to see her OB/GYN   Final Clinical Impressions(s) / UC Diagnoses   Final diagnoses:  Cystitis  Abscess     Discharge Instructions     We are treating you for the abscess and a urinary tract infection. Take the Keflex 4 times a day for the next 7 days. Warm compresses to the abscess.  If the abscess worsens despite treatment and it appears to need drained you need to return. I am also sending your self swab for further testing Follow up as needed for continued or worsening symptoms     ED Prescriptions    Medication Sig Dispense Auth. Provider   cephALEXin (KEFLEX) 500 MG capsule Take 1 capsule (  500 mg total) by mouth 4 (four) times daily. 28 capsule Loura Halt A, NP     Controlled Substance Prescriptions [place] Controlled Substance Registry consulted? Not Applicable   Orvan July, NP 01/31/19 1326

## 2019-01-31 NOTE — ED Triage Notes (Signed)
Pt cc she pelvis pain x 2 days. Pt also has a abscess under her left breast x 2 days. Pt has tried hot compress and it didn't work.

## 2019-01-31 NOTE — Discharge Instructions (Signed)
We are treating you for the abscess and a urinary tract infection. Take the Keflex 4 times a day for the next 7 days. Warm compresses to the abscess.  If the abscess worsens despite treatment and it appears to need drained you need to return. I am also sending your self swab for further testing Follow up as needed for continued or worsening symptoms

## 2019-02-01 LAB — CERVICOVAGINAL ANCILLARY ONLY
Bacterial vaginitis: POSITIVE — AB
Candida vaginitis: NEGATIVE
Chlamydia: NEGATIVE
Neisseria Gonorrhea: NEGATIVE
Trichomonas: NEGATIVE

## 2019-02-02 ENCOUNTER — Encounter (HOSPITAL_COMMUNITY): Payer: Self-pay

## 2019-02-02 ENCOUNTER — Telehealth (HOSPITAL_COMMUNITY): Payer: Self-pay | Admitting: Emergency Medicine

## 2019-02-02 ENCOUNTER — Ambulatory Visit (HOSPITAL_COMMUNITY)
Admission: EM | Admit: 2019-02-02 | Discharge: 2019-02-02 | Disposition: A | Discharge status: Home / Self Care | Payer: Medicare HMO | Attending: Family Medicine | Admitting: Family Medicine

## 2019-02-02 ENCOUNTER — Other Ambulatory Visit: Payer: Self-pay

## 2019-02-02 DIAGNOSIS — R509 Fever, unspecified: Secondary | ICD-10-CM | POA: Diagnosis not present

## 2019-02-02 DIAGNOSIS — R110 Nausea: Secondary | ICD-10-CM

## 2019-02-02 DIAGNOSIS — L0291 Cutaneous abscess, unspecified: Secondary | ICD-10-CM | POA: Diagnosis not present

## 2019-02-02 DIAGNOSIS — N611 Abscess of the breast and nipple: Secondary | ICD-10-CM | POA: Diagnosis not present

## 2019-02-02 LAB — URINE CULTURE: Culture: >=100000 — AB

## 2019-02-02 MED ORDER — DOXYCYCLINE HYCLATE 100 MG PO CAPS: 100.0000 mg | ORAL_CAPSULE | Freq: Two times a day (BID) | ORAL | 0 refills | Status: DC

## 2019-02-02 MED ORDER — CEFTRIAXONE SODIUM 1 G IJ SOLR: 1.0000 g | Freq: Once | INTRAMUSCULAR | Status: DC

## 2019-02-02 NOTE — Telephone Encounter (Signed)
Attempted to contact patient, patient needs to keep taking keflex for UTi and doxy for abscess. Unable to reach patient. Patient also left without receiving rocephin injection. No voicemail is available.

## 2019-02-02 NOTE — Discharge Instructions (Addendum)
I would like for you to stop taking the Keflex and start taking the doxycycline Come back for wound recheck in 2 days If your symptoms worsen between now and then you need to be re-seen sooner most likely in the ER.

## 2019-02-02 NOTE — ED Provider Notes (Addendum)
[place]    CSN: [id number] Arrival date & time: 02/02/19  0825     History   Chief Complaint Chief Complaint  Patient presents with  . Abscess    HPI Melissa Washington is a 33 y.o. female.   Patient is a 33 year old female that comes in today with worsening abscess under her right breast.  She was seen here 2 days ago and started on Keflex.  Reporting small opening in the abscess with mild drainage.  She has had fever and nausea that started last night.  Denies any dizziness, weakness. She is mildly tachycardic here today with fever of 100.2. She has been putting cold pack on it.   ROS per HPI      Past Medical History:  Diagnosis Date  . 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 Bacteremia associated with IV line ([place]) 05/01/2013   Port-a-cath infection, removed 8/6 by IR. Ecoli grew out in cultures. Sent home with Ceftriaxone to complete 14 day course.   . Bacteremia due to Escherichia coli 05/01/2013   Port-a-cath infection, removed 8/6 by IR. Ecoli grew out in cultures. Sent home with Ceftriaxone to complete 14 day course.   . Cholelithiasis 01/17/2012  . Collagen vascular disease ([place])   . Diabetes mellitus    type 2, insulin  . Headache(784.0)   . Hepatic steatosis 01/17/2012  . Hypertension   . Leukemia, acute monocytic, in remission ([place])   . Nephrolithiasis   . Obesity     Patient Active Problem List   Diagnosis Date Noted  . Trochanteric bursitis, right hip 09/28/2018  . Trochanteric bursitis, left hip 09/28/2018  . Acute pyelonephritis 04/07/2017  . Vaginitis and vulvovaginitis 10/09/2014  . Alopecia 06/26/2014  . Bilateral hip pain 06/19/2014  . Anxiety associated with depression 06/19/2014  . Allergic rhinitis 06/07/2014  . IUD complication ([place]) 67/34/1937  . Exposure to STD 06/07/2014  . 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 Morbid obesity with BMI of 50.0-59.9, adult ([place]) 01/20/2012  . DM  (diabetes mellitus), type 2, uncontrolled ([place]) 12/30/2006  . HYPERTENSION, BENIGN ESSENTIAL 12/30/2006    Past Surgical History:  Procedure Laterality Date  . CHOLECYSTECTOMY  01/31/2012   Procedure: LAPAROSCOPIC CHOLECYSTECTOMY WITH INTRAOPERATIVE CHOLANGIOGRAM;  Surgeon: Marcello Moores A. Cornett, MD;  Location: [facility];  Service: General;  Laterality: N/A;  . PORTACATH PLACEMENT     [place]  . portacath removal      OB History    Gravida  2   Para  2   Term  2   Preterm  0   AB  0   Living  2     SAB  0   TAB  0   Ectopic  0   Multiple  0   Live Births  2            Home Medications    Prior to Admission medications   Medication Sig Start Date End Date Taking? Authorizing Provider  blood glucose meter kit and supplies Dispense based on patient and insurance preference. Use up to four times daily as directed. (FOR ICD-9 250.00, 250.01). 04/11/17   Dessa Phi, DO  cetirizine (ZYRTEC) 10 MG tablet Take 1 tablet (10 mg total) by mouth daily. 01/27/18   Augusto Gamble B, NP  diclofenac sodium (VOLTAREN) 1 % GEL Apply 2 g topically 4 (four) times daily. Patient not taking: Reported on 11/10/2018 09/28/18   Mcarthur Rossetti, MD  doxycycline (VIBRAMYCIN) 100 MG capsule Take 1 capsule (100 mg total) by mouth 2 (two) times daily. 02/02/19   Loura Halt A, NP  Insulin Glargine (LANTUS) 100 UNIT/ML Solostar Pen Inject 30 Units into the skin daily at 10 pm. Patient taking differently: Inject 20 Units daily at 10 pm into the skin.  04/11/17 09/18/23  Dessa Phi, DO  Insulin Pen Needle 31G X 5 MM MISC Use with insulin, 4 times daily 04/11/17   Dessa Phi, DO  levonorgestrel (MIRENA) 20 MCG/24HR IUD 1 each by Intrauterine route continuous.    [provider]  lisinopril (PRINIVIL,ZESTRIL) 5 MG tablet Take 1 tablet (5 mg total) by mouth daily. 04/11/17   Dessa Phi, DO  metFORMIN (GLUCOPHAGE) 1000 MG tablet Take 1 tablet (1,000 mg total) by mouth 2 (two) times daily  with a meal. Patient taking differently: Take 500 mg by mouth 2 (two) times daily with a meal.  04/11/17   Dessa Phi, DO    Family History Family History  Problem Relation Age of Onset  . Kidney disease Mother   . Hypertension Mother   . Epilepsy Mother   . Sleep apnea Mother   . Diabetes Father   . Kidney disease Maternal Grandmother   . Diabetes Maternal Grandmother     Social History Social History   Tobacco Use  . Smoking status: Former Research scientist (life sciences)  . Smokeless tobacco: Never Used  Substance Use Topics  . Alcohol use: No  . Drug use: No     Allergies   Ultram [tramadol hcl]   Review of Systems Review of Systems   Physical Exam Triage Vital Signs ED Triage Vitals  Enc Vitals Group     BP 02/02/19 0900 130/90     Pulse Rate 02/02/19 0900 (!) 115     Resp 02/02/19 0900 (!) 22     Temp 02/02/19 0900 100.2 F (37.9 C)     Temp Source 02/02/19 0900 Oral     SpO2 02/02/19 0900 98 %     Weight --      Height --      Head Circumference --      Peak Flow --      Pain Score 02/02/19 0858 10     Pain Loc --      Pain Edu? --      Excl. in [place]? --    No data found.  Updated Vital Signs BP 130/90 (BP Location: Left Arm)   Pulse (!) 115   Temp 100.2 F (37.9 C) (Oral)   Resp (!) 22   SpO2 98%   Visual Acuity Right Eye Distance:   Left Eye Distance:   Bilateral Distance:    Right Eye Near:   Left Eye Near:    Bilateral Near:     Physical Exam Vitals signs and nursing note reviewed.  Constitutional:      General: She is not in acute distress.    Appearance: Normal appearance. She is not ill-appearing, toxic-appearing or diaphoretic.  HENT:     Head: Normocephalic and atraumatic.  Eyes:     Conjunctiva/sclera: Conjunctivae normal.  Neck:     Musculoskeletal: Normal range of motion.  Pulmonary:     Effort: Pulmonary effort is normal.  Musculoskeletal: Normal range of motion.  Skin:    General: Skin is warm and dry.     Comments: Large  worsening abscess than 2 days ago. Erythema and warm to touch. Tender.  Approximate 6 to 7 m x  3 to 4 cm Small whole where the abscess has been draining.   Neurological:     Mental Status: She is alert.  Psychiatric:        Mood and Affect: Mood normal.      UC Treatments / Results  Labs (all labs ordered are listed, but only abnormal results are displayed) Labs Reviewed  AEROBIC CULTURE (SUPERFICIAL SPECIMEN)    EKG None  Radiology No results found.  Procedures Incision and Drainage Date/Time: 02/02/2019 10:21 AM Performed by: Orvan July, NP Authorized by: Orvan July, NP   Consent:    Consent obtained:  Verbal   Consent given by:  Patient   Risks discussed:  Bleeding, incomplete drainage, pain and damage to other organs   Alternatives discussed:  No treatment Universal protocol:    Patient identity confirmed:  Verbally with patient Location:    Type:  Abscess   Location:  Trunk   Trunk location:  Abdomen Pre-procedure details:    Skin preparation:  Betadine Anesthesia (see MAR for exact dosages):    Anesthesia method:  Local infiltration   Local anesthetic:  Lidocaine 2% w/o epi Procedure type:    Complexity:  Complex Procedure details:    Needle aspiration: no     Incision types:  Single straight   Incision depth:  Subcutaneous   Scalpel blade:  11   Wound management:  Probed and deloculated, irrigated with saline and extensive cleaning   Drainage:  Purulent and bloody   Drainage amount:  Copious   Wound treatment:  Wound left open   Packing materials:  1/4 in gauze Post-procedure details:    Patient tolerance of procedure:  Tolerated well, no immediate complications   (including critical care time)  Medications Ordered in UC Medications  cefTRIAXone (ROCEPHIN) injection 1 g (has no administration in time range)    Initial Impression / Assessment and Plan / UC Course  I have reviewed the triage vital signs and the nursing notes.   Pertinent labs & imaging results that were available during my care of the patient were reviewed by me and considered in my medical decision making (see chart for details).    Abscess  I&D of abscess with large amount of purulent drainage.  Packing placed.  Changed abx to doxy and had having her return in 2 days for packing removal and wound check  Rocephin given here in the clinic.   Final Clinical Impressions(s) / UC Diagnoses   Final diagnoses:  Abscess     Discharge Instructions     I would like for you to stop taking the Keflex and start taking the doxycycline Come back for wound recheck in 2 days If your symptoms worsen between now and then you need to be re-seen sooner most likely in the ER.    ED Prescriptions    Medication Sig Dispense Auth. Provider   doxycycline (VIBRAMYCIN) 100 MG capsule Take 1 capsule (100 mg total) by mouth 2 (two) times daily. 20 capsule Loura Halt A, NP     Controlled Substance Prescriptions [place] Controlled Substance Registry consulted? Not Applicable        Orvan July, NP 02/02/19 1023

## 2019-02-02 NOTE — ED Triage Notes (Addendum)
Pt C/O abscess underneath her right breast.  Pt states symptoms has gotten worst. The abscess is drainage and tender to touch.

## 2019-02-02 NOTE — ED Notes (Signed)
Staff informed Melissa Washington  that the patient left without treatment and discharge papers. I called patient cell phone several times and its going straight to voicemail and the mailbox is full.  Staff then reached out to the patient mother and asked her mother inform patient to please give me a call as soon as possible.

## 2019-02-04 LAB — AEROBIC CULTURE W GRAM STAIN (SUPERFICIAL SPECIMEN): Special Requests: NORMAL

## 2019-02-04 LAB — AEROBIC CULTURE? (SUPERFICIAL SPECIMEN)

## 2019-02-06 ENCOUNTER — Telehealth (HOSPITAL_COMMUNITY): Payer: Self-pay | Admitting: Emergency Medicine

## 2019-02-06 MED ORDER — FLUCONAZOLE 150 MG PO TABS: 150.0000 mg | ORAL_TABLET | Freq: Once | ORAL | 0 refills | Status: AC

## 2019-02-06 NOTE — Telephone Encounter (Signed)
Pt on doxy, contacted patient to make her aware and encourage finishing antibiotics. Pt will return if not getting better or getting worse. All questions answered.

## 2019-02-06 NOTE — Telephone Encounter (Signed)
Pt called asking for diflucan for yeast infection from taking the doxy.

## 2019-08-24 ENCOUNTER — Ambulatory Visit: Payer: Medicare HMO | Admitting: Physician Assistant

## 2019-08-28 ENCOUNTER — Ambulatory Visit: Payer: Medicare HMO | Admitting: Physician Assistant

## 2019-08-30 MED FILL — ACCU-CHEK FASTCLIX LANCETS: 51 days supply | Qty: 102 | Fill #0

## 2019-08-30 MED FILL — metFORMIN HCL ER 500 MG TB2: 500 | 30 days supply | Qty: 60 | Fill #0

## 2019-08-30 MED FILL — ACCU-CHEK GUIDE MONITOR SYS: W/DEVICE | 1 days supply | Qty: 1 | Fill #0

## 2019-08-30 MED FILL — LANTUS SOLOSTAR 100 UNITS/M: 100 | 30 days supply | Qty: 9 | Fill #0

## 2019-08-30 MED FILL — OMEPRAZOLE 40 MG CPDR: 40 | 30 days supply | Qty: 30 | Fill #0

## 2019-08-30 MED FILL — ACCU-CHEK GUIDE TEST STRIP: 50 days supply | Qty: 100 | Fill #0

## 2019-08-30 MED FILL — NOVOLOG FLEXPEN SYRINGE: 100 | 15 days supply | Qty: 3 | Fill #0

## 2019-08-30 MED FILL — LISINOPRIL 10 MG TABS: 10 | 30 days supply | Qty: 30 | Fill #0

## 2019-08-30 MED FILL — LAMOTRIGINE 25 MG TABS: 25 | 34 days supply | Qty: 60 | Fill #0

## 2019-11-20 ENCOUNTER — Ambulatory Visit: Payer: Medicare HMO | Admitting: Physician Assistant

## 2019-11-22 ENCOUNTER — Ambulatory Visit: Payer: Medicare HMO | Admitting: Physician Assistant

## 2020-01-10 ENCOUNTER — Other Ambulatory Visit: Payer: Self-pay

## 2020-01-10 ENCOUNTER — Emergency Department (HOSPITAL_COMMUNITY): Payer: Medicare HMO

## 2020-01-10 ENCOUNTER — Encounter (HOSPITAL_COMMUNITY): Payer: Self-pay | Admitting: Emergency Medicine

## 2020-01-10 ENCOUNTER — Inpatient Hospital Stay (HOSPITAL_COMMUNITY)
Admission: EM | Admit: 2020-01-10 | Discharge: 2020-01-14 | DRG: 872 | Disposition: A | Discharge status: Home / Self Care | Payer: Medicare HMO | Attending: Internal Medicine | Admitting: Internal Medicine

## 2020-01-10 DIAGNOSIS — D509 Iron deficiency anemia, unspecified: Secondary | ICD-10-CM | POA: Diagnosis present

## 2020-01-10 DIAGNOSIS — N12 Tubulo-interstitial nephritis, not specified as acute or chronic: Principal | ICD-10-CM | POA: Diagnosis present

## 2020-01-10 DIAGNOSIS — Z885 Allergy status to narcotic agent status: Secondary | ICD-10-CM

## 2020-01-10 DIAGNOSIS — E538 Deficiency of other specified B group vitamins: Secondary | ICD-10-CM | POA: Diagnosis present

## 2020-01-10 DIAGNOSIS — IMO0002 Reserved for concepts with insufficient information to code with codable children: Secondary | ICD-10-CM | POA: Diagnosis present

## 2020-01-10 DIAGNOSIS — A4151 Sepsis due to Escherichia coli [E. coli]: Principal | ICD-10-CM | POA: Diagnosis present

## 2020-01-10 DIAGNOSIS — Z820 Family history of epilepsy and other diseases of the nervous system: Secondary | ICD-10-CM

## 2020-01-10 DIAGNOSIS — Z6841 Body Mass Index (BMI) 40.0 and over, adult: Secondary | ICD-10-CM

## 2020-01-10 DIAGNOSIS — Z20822 Contact with and (suspected) exposure to covid-19: Secondary | ICD-10-CM | POA: Diagnosis present

## 2020-01-10 DIAGNOSIS — Z8249 Family history of ischemic heart disease and other diseases of the circulatory system: Secondary | ICD-10-CM

## 2020-01-10 DIAGNOSIS — Z79899 Other long term (current) drug therapy: Secondary | ICD-10-CM

## 2020-01-10 DIAGNOSIS — A419 Sepsis, unspecified organism: Secondary | ICD-10-CM

## 2020-01-10 DIAGNOSIS — I10 Essential (primary) hypertension: Secondary | ICD-10-CM | POA: Diagnosis present

## 2020-01-10 DIAGNOSIS — Z9049 Acquired absence of other specified parts of digestive tract: Secondary | ICD-10-CM

## 2020-01-10 DIAGNOSIS — Z794 Long term (current) use of insulin: Secondary | ICD-10-CM

## 2020-01-10 DIAGNOSIS — Z87891 Personal history of nicotine dependence: Secondary | ICD-10-CM

## 2020-01-10 DIAGNOSIS — E119 Type 2 diabetes mellitus without complications: Secondary | ICD-10-CM | POA: Diagnosis present

## 2020-01-10 DIAGNOSIS — Z841 Family history of disorders of kidney and ureter: Secondary | ICD-10-CM

## 2020-01-10 DIAGNOSIS — E1165 Type 2 diabetes mellitus with hyperglycemia: Secondary | ICD-10-CM | POA: Diagnosis present

## 2020-01-10 DIAGNOSIS — C9591 Leukemia, unspecified, in remission: Secondary | ICD-10-CM | POA: Diagnosis present

## 2020-01-10 DIAGNOSIS — Z833 Family history of diabetes mellitus: Secondary | ICD-10-CM

## 2020-01-10 LAB — URINALYSIS, ROUTINE W REFLEX MICROSCOPIC
Bilirubin Urine: NEGATIVE
Glucose, UA: NEGATIVE mg/dL
Ketones, ur: NEGATIVE mg/dL
Nitrite: NEGATIVE
Protein, ur: 30 mg/dL — AB
Specific Gravity, Urine: 1.009 (ref 1.005–1.030)
WBC, UA: >50 WBC/hpf — ABNORMAL HIGH (ref 0–5)
pH: 6 (ref 5.0–8.0)

## 2020-01-10 LAB — CBC
HCT: 34.8 % — ABNORMAL LOW (ref 36.0–46.0)
Hemoglobin: 11.8 g/dL — ABNORMAL LOW (ref 12.0–15.0)
MCH: 29.9 pg (ref 26.0–34.0)
MCHC: 33.9 g/dL (ref 30.0–36.0)
MCV: 88.3 fL (ref 80.0–100.0)
Platelets: 212 10*3/uL (ref 150–400)
RBC: 3.94 MIL/uL (ref 3.87–5.11)
RDW: 12.7 % (ref 11.5–15.5)
WBC: 11.8 10*3/uL — ABNORMAL HIGH (ref 4.0–10.5)
nRBC: 0 % (ref 0.0–0.2)

## 2020-01-10 LAB — COMPREHENSIVE METABOLIC PANEL
ALT: 30 U/L (ref 0–44)
AST: 26 U/L (ref 15–41)
Albumin: 4.4 g/dL (ref 3.5–5.0)
Alkaline Phosphatase: 65 U/L (ref 38–126)
Anion gap: 9 (ref 5–15)
BUN: 11 mg/dL (ref 6–20)
CO2: 25 mmol/L (ref 22–32)
Calcium: 9.1 mg/dL (ref 8.9–10.3)
Chloride: 103 mmol/L (ref 98–111)
Creatinine, Ser: 0.71 mg/dL (ref 0.44–1.00)
GFR calc Af Amer: >60 mL/min (ref >60)
GFR calc non Af Amer: >60 mL/min (ref >60)
Glucose, Bld: 156 mg/dL — ABNORMAL HIGH (ref 70–99)
Potassium: 3.7 mmol/L (ref 3.5–5.1)
Sodium: 137 mmol/L (ref 135–145)
Total Bilirubin: 1.5 mg/dL — ABNORMAL HIGH (ref 0.3–1.2)
Total Protein: 7.7 g/dL (ref 6.5–8.1)

## 2020-01-10 LAB — APTT: aPTT: 33 seconds (ref 24–36)

## 2020-01-10 LAB — RESPIRATORY PANEL BY RT PCR (FLU A&B, COVID)
Influenza A by PCR: NEGATIVE
Influenza B by PCR: NEGATIVE
SARS Coronavirus 2 by RT PCR: NEGATIVE

## 2020-01-10 LAB — LACTIC ACID, PLASMA: Lactic Acid, Venous: 1.5 mmol/L (ref 0.5–1.9)

## 2020-01-10 LAB — PROTIME-INR
INR: 1.1 (ref 0.8–1.2)
Prothrombin Time: 14.1 seconds (ref 11.4–15.2)

## 2020-01-10 LAB — LIPASE, BLOOD: Lipase: 47 U/L (ref 11–51)

## 2020-01-10 LAB — I-STAT BETA HCG BLOOD, ED (MC, WL, AP ONLY): I-stat hCG, quantitative: <5 m[IU]/mL (ref <5)

## 2020-01-10 MED ORDER — PROCHLORPERAZINE EDISYLATE 10 MG/2ML IJ SOLN
10.0000 mg | Freq: Once | INTRAMUSCULAR | Status: AC
  Administered 2020-01-10: 10 mg via INTRAVENOUS
  Filled 2020-01-10: qty 2

## 2020-01-10 MED ORDER — FENTANYL CITRATE (PF) 100 MCG/2ML IJ SOLN
50.0000 ug | Freq: Once | INTRAMUSCULAR | Status: AC
  Administered 2020-01-10: 50 ug via INTRAVENOUS
  Filled 2020-01-10: qty 2

## 2020-01-10 MED ORDER — ONDANSETRON HCL 4 MG/2ML IJ SOLN
4.0000 mg | Freq: Once | INTRAMUSCULAR | Status: AC
  Administered 2020-01-10: 21:00:00 4 mg via INTRAVENOUS
  Filled 2020-01-10: qty 2

## 2020-01-10 MED ORDER — SODIUM CHLORIDE 0.9 % IV SOLN
1.0000 g | Freq: Once | INTRAVENOUS | Status: AC
  Administered 2020-01-10: 1 g via INTRAVENOUS
  Filled 2020-01-10: qty 10

## 2020-01-10 MED ORDER — SODIUM CHLORIDE 0.9% FLUSH: 3.0000 mL | Freq: Once | INTRAVENOUS | Status: DC

## 2020-01-10 MED ORDER — SODIUM CHLORIDE (PF) 0.9 % IJ SOLN
INTRAMUSCULAR | Status: AC
  Filled 2020-01-10: qty 50

## 2020-01-10 MED ORDER — FENTANYL CITRATE (PF) 100 MCG/2ML IJ SOLN
50.0000 ug | Freq: Once | INTRAMUSCULAR | Status: AC
  Administered 2020-01-10: 21:00:00 50 ug via INTRAVENOUS
  Filled 2020-01-10: qty 2

## 2020-01-10 MED ORDER — ACETAMINOPHEN 325 MG PO TABS
650.0000 mg | ORAL_TABLET | Freq: Once | ORAL | Status: AC
  Administered 2020-01-10: 21:00:00 650 mg via ORAL
  Filled 2020-01-10: qty 2

## 2020-01-10 MED ORDER — SODIUM CHLORIDE 0.9 % IV BOLUS
1000.0000 mL | Freq: Once | INTRAVENOUS | Status: AC
  Administered 2020-01-10: 1000 mL via INTRAVENOUS

## 2020-01-10 MED ORDER — IOHEXOL 300 MG/ML  SOLN
100.0000 mL | Freq: Once | INTRAMUSCULAR | Status: AC | PRN
  Administered 2020-01-10: 100 mL via INTRAVENOUS

## 2020-01-10 NOTE — ED Notes (Signed)
Labeled urine specimen and culture sent to lab for analysis per MD order. [employer]

## 2020-01-10 NOTE — ED Triage Notes (Signed)
Pt c/o left side abd pains that started last night. Thinks may be her kidney. In remission for leukemia.

## 2020-01-10 NOTE — ED Provider Notes (Signed)
[facility] DEPT Provider Note   CSN: [id number] Arrival date & time: 01/10/20  1836     History Chief Complaint  Patient presents with  . Abdominal Pain    Melissa Washington is a 34 y.o. female.  The history is provided by the patient.  Abdominal Pain Pain location:  L flank Pain quality: aching, sharp and shooting   Pain radiates to:  Does not radiate Pain severity:  Mild Onset quality:  Gradual Timing:  Constant Progression:  Worsening Chronicity:  New Context: not previous surgeries and not sick contacts   Relieved by:  Nothing Worsened by:  Nothing Associated symptoms: dysuria and nausea   Associated symptoms: no chest pain, no chills, no cough, no fever, no hematuria, no shortness of breath, no sore throat and no vomiting   Risk factors comment:  Hx of leukiemia      Past Medical History:  Diagnosis Date  . 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 Bacteremia associated with IV line ([place]) 05/01/2013   Port-a-cath infection, removed 8/6 by IR. Ecoli grew out in cultures. Sent home with Ceftriaxone to complete 14 day course.   . Bacteremia due to Escherichia coli 05/01/2013   Port-a-cath infection, removed 8/6 by IR. Ecoli grew out in cultures. Sent home with Ceftriaxone to complete 14 day course.   . Cholelithiasis 01/17/2012  . Collagen vascular disease ([place])   . Diabetes mellitus    type 2, insulin  . Headache(784.0)   . Hepatic steatosis 01/17/2012  . Hypertension   . Leukemia, acute monocytic, in remission ([place])   . Nephrolithiasis   . Obesity     Patient Active Problem List   Diagnosis Date Noted  . Trochanteric bursitis, right hip 09/28/2018  . Trochanteric bursitis, left hip 09/28/2018  . Acute pyelonephritis 04/07/2017  . Vaginitis and vulvovaginitis 10/09/2014  . Alopecia 06/26/2014  . Bilateral hip pain 06/19/2014  . Anxiety associated with depression 06/19/2014  . Allergic rhinitis 06/07/2014  .  IUD complication ([place]) 93/23/5573  . Exposure to STD 06/07/2014  . 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 Morbid obesity with BMI of 50.0-59.9, adult ([place]) 01/20/2012  . DM (diabetes mellitus), type 2, uncontrolled ([place]) 12/30/2006  . HYPERTENSION, BENIGN ESSENTIAL 12/30/2006    Past Surgical History:  Procedure Laterality Date  . CHOLECYSTECTOMY  01/31/2012   Procedure: LAPAROSCOPIC CHOLECYSTECTOMY WITH INTRAOPERATIVE CHOLANGIOGRAM;  Surgeon: Marcello Moores A. Cornett, MD;  Location: [facility];  Service: General;  Laterality: N/A;  . PORTACATH PLACEMENT     [place]  . portacath removal       OB History    Gravida  2   Para  2   Term  2   Preterm  0   AB  0   Living  2     SAB  0   TAB  0   Ectopic  0   Multiple  0   Live Births  2           Family History  Problem Relation Age of Onset  . Kidney disease Mother   . Hypertension Mother   . Epilepsy Mother   . Sleep apnea Mother   . Diabetes Father   . Kidney disease Maternal Grandmother   . Diabetes Maternal Grandmother     Social History   Tobacco Use  . Smoking status: Former Research scientist (life sciences)  . Smokeless tobacco: Never Used  Substance Use Topics  . Alcohol use: No  .  Drug use: No    Home Medications Prior to Admission medications   Medication Sig Start Date End Date Taking? Authorizing Provider  Insulin Glargine (LANTUS) 100 UNIT/ML Solostar Pen Inject 30 Units into the skin daily at 10 pm. Patient taking differently: Inject 32 Units into the skin daily at 10 pm.  04/11/17 09/18/23 Yes Dessa Phi, DO  levonorgestrel (MIRENA) 20 MCG/24HR IUD 1 each by Intrauterine route continuous.   Yes [provider]  lisinopril (ZESTRIL) 10 MG tablet Take 10 mg by mouth daily. 11/10/19  Yes [provider]  metFORMIN (GLUCOPHAGE-XR) 500 MG 24 hr tablet Take 500 mg by mouth 2 (two) times daily. 11/10/19  Yes [provider]  NOVOLOG FLEXPEN 100 UNIT/ML FlexPen Inject 3-20 Units  into the skin in the morning, at noon, and at bedtime. 11/10/19  Yes [provider]  blood glucose meter kit and supplies Dispense based on patient and insurance preference. Use up to four times daily as directed. (FOR ICD-9 250.00, 250.01). 04/11/17   Dessa Phi, DO  cetirizine (ZYRTEC) 10 MG tablet Take 1 tablet (10 mg total) by mouth daily. Patient not taking: Reported on 01/10/2020 01/27/18   Augusto Gamble B, NP  diclofenac sodium (VOLTAREN) 1 % GEL Apply 2 g topically 4 (four) times daily. Patient not taking: Reported on 11/10/2018 09/28/18   Mcarthur Rossetti, MD  doxycycline (VIBRAMYCIN) 100 MG capsule Take 1 capsule (100 mg total) by mouth 2 (two) times daily. Patient not taking: Reported on 01/10/2020 02/02/19   Loura Halt A, NP  Insulin Pen Needle 31G X 5 MM MISC Use with insulin, 4 times daily 04/11/17   Dessa Phi, DO    Allergies    Ultram [place] Mode hcl]  Review of Systems   Review of Systems  Constitutional: Negative for chills and fever.  HENT: Negative for ear pain and sore throat.   Eyes: Negative for pain and visual disturbance.  Respiratory: Negative for cough and shortness of breath.   Cardiovascular: Negative for chest pain and palpitations.  Gastrointestinal: Positive for abdominal pain and nausea. Negative for vomiting.  Genitourinary: Positive for dysuria. Negative for hematuria.  Musculoskeletal: Negative for arthralgias and back pain.  Skin: Negative for color change and rash.  Neurological: Negative for seizures and syncope.  All other systems reviewed and are negative.   Physical Exam Updated Vital Signs  ED Triage Vitals  Enc Vitals Group     BP 01/10/20 1851 (!) 136/93     Pulse Rate 01/10/20 1851 (!) 104     Resp 01/10/20 1851 18     Temp 01/10/20 1851 (!) 102.1 F (38.9 C)     Temp Source 01/10/20 1851 Oral     SpO2 01/10/20 1851 100 %     Weight --      Height --      Head Circumference --      Peak Flow --      Pain Score  01/10/20 1850 10     Pain Loc --      Pain Edu? --      Excl. in [place]? --     Physical Exam Vitals and nursing note reviewed.  Constitutional:      General: She is in acute distress.     Appearance: She is well-developed. She is obese. She is ill-appearing.  HENT:     Head: Normocephalic and atraumatic.  Eyes:     Conjunctiva/sclera: Conjunctivae normal.  Cardiovascular:     Rate and  Rhythm: Regular rhythm. Tachycardia present.     Heart sounds: No murmur.  Pulmonary:     Effort: Pulmonary effort is normal. No respiratory distress.     Breath sounds: Normal breath sounds.  Abdominal:     General: There is no distension.     Palpations: Abdomen is soft.     Tenderness: There is no abdominal tenderness. There is left CVA tenderness. There is no right CVA tenderness, guarding or rebound. Negative signs include Murphy's sign, Rovsing's sign, McBurney's sign and psoas sign.  Musculoskeletal:     Cervical back: Neck supple.  Skin:    General: Skin is warm and dry.     Capillary Refill: Capillary refill takes less than 2 seconds.  Neurological:     General: No focal deficit present.     Mental Status: She is alert.  Psychiatric:        Mood and Affect: Mood normal.     ED Results / Procedures / Treatments   Labs (all labs ordered are listed, but only abnormal results are displayed) Labs Reviewed  COMPREHENSIVE METABOLIC PANEL - Abnormal; Notable for the following components:      Result Value   Glucose, Bld 156 (*)    Total Bilirubin 1.5 (*)    All other components within normal limits  CBC - Abnormal; Notable for the following components:   WBC 11.8 (*)    Hemoglobin 11.8 (*)    HCT 34.8 (*)    All other components within normal limits  URINALYSIS, ROUTINE W REFLEX MICROSCOPIC - Abnormal; Notable for the following components:   APPearance CLOUDY (*)    Hgb urine dipstick MODERATE (*)    Protein, ur 30 (*)    Leukocytes,Ua LARGE (*)    WBC, UA >50 (*)    Bacteria, UA  MANY (*)    All other components within normal limits  RESPIRATORY PANEL BY RT PCR (FLU A&B, COVID)  CULTURE, BLOOD (ROUTINE X 2)  CULTURE, BLOOD (ROUTINE X 2)  URINE CULTURE  LIPASE, BLOOD  LACTIC ACID, PLASMA  APTT  PROTIME-INR  LACTIC ACID, PLASMA  I-STAT BETA HCG BLOOD, ED ([facility], [facility], [facility] ONLY)    EKG EKG Interpretation  Date/Time:  Wednesday January 10 2020 21:03:35 EDT Ventricular Rate:  103 PR Interval:    QRS Duration: 118 QT Interval:  309 QTC Calculation: 405 R Axis:   38 Text Interpretation: Sinus tachycardia Nonspecific intraventricular conduction delay Borderline repolarization abnormality Confirmed by Lennice Sites [phone]) on 01/10/2020 9:11:50 PM   Radiology CT ABDOMEN PELVIS W CONTRAST  Result Date: 01/10/2020 CLINICAL DATA:  Left-sided flank pain since yesterday, history of leukemia EXAM: CT ABDOMEN AND PELVIS WITH CONTRAST TECHNIQUE: Multidetector CT imaging of the abdomen and pelvis was performed using the standard protocol following bolus administration of intravenous contrast. Imaging was delayed as patient vomited after contrast administration. CONTRAST:  134m OMNIPAQUE IOHEXOL 300 MG/ML  SOLN COMPARISON:  09/17/2017 FINDINGS: Lower chest: No acute pleural or parenchymal lung disease. Hepatobiliary: No focal liver abnormality is seen. Status post cholecystectomy. No biliary dilatation. Pancreas: Unremarkable. No pancreatic ductal dilatation or surrounding inflammatory changes. Spleen: Normal in size without focal abnormality. Adrenals/Urinary Tract: Because of the delayed nature of the exam, contrast is already excreted by the kidneys at the time of the study. There is no high-grade obstruction. Small nonobstructing calculi could be obscured by the excreted contrast. The adrenals are unremarkable. Bladder is normal. Stomach/Bowel: Normal appendix right lower quadrant. No bowel obstruction or ileus. No  bowel wall thickening or inflammatory change. Vascular/Lymphatic: No  significant vascular findings are present. No enlarged abdominal or pelvic lymph nodes. Reproductive: IUD within the uterus. No adnexal masses. Other: No abdominal wall hernia or abnormality. No abdominopelvic ascites. Musculoskeletal: No acute or destructive bony lesions. Reconstructed images demonstrate no additional findings. IMPRESSION: 1. No acute intra-abdominal or intrapelvic process. 2. Exam was delayed due to patient vomiting after contrast administration. Small nonobstructing urinary tract calculi could be obscured by the excreted contrast. Electronically Signed   By: Randa Ngo M.D.   On: 01/10/2020 22:32   DG Chest Port 1 View  Result Date: 01/10/2020 CLINICAL DATA:  Fever.  Hypertension. EXAM: PORTABLE CHEST 1 VIEW COMPARISON:  04/07/2017 FINDINGS: Midline trachea. Normal heart size for level of inspiration. Breast tissue overlies the lower lungs. No pleural effusion or pneumothorax. Clear lungs. IMPRESSION: No acute cardiopulmonary disease. Electronically Signed   By: Abigail Miyamoto M.D.   On: 01/10/2020 20:36    Procedures .Critical Care Performed by: Lennice Sites, DO Authorized by: Lennice Sites, DO   Critical care provider statement:    Critical care time (minutes):  35   Critical care was necessary to treat or prevent imminent or life-threatening deterioration of the following conditions:  Sepsis   Critical care was time spent personally by me on the following activities:  Blood draw for specimens, development of treatment plan with patient or surrogate, discussions with primary provider, evaluation of patient's response to treatment, examination of patient, obtaining history from patient or surrogate, ordering and performing treatments and interventions, ordering and review of laboratory studies, ordering and review of radiographic studies, pulse oximetry, review of old charts and re-evaluation of patient's condition   I assumed direction of critical care for this patient from  another provider in my specialty: no     (including critical care time)  Medications Ordered in ED Medications  sodium chloride flush (NS) 0.9 % injection 3 mL (3 mLs Intravenous Not Given 01/10/20 2249)  sodium chloride (PF) 0.9 % injection (has no administration in time range)  sodium chloride 0.9 % bolus 1,000 mL (has no administration in time range)  fentaNYL (SUBLIMAZE) injection 50 mcg (has no administration in time range)  prochlorperazine (COMPAZINE) injection 10 mg (has no administration in time range)  cefTRIAXone (ROCEPHIN) 1 g in sodium chloride 0.9 % 100 mL IVPB (0 g Intravenous Stopped 01/10/20 2221)  ondansetron (ZOFRAN) injection 4 mg (4 mg Intravenous Given 01/10/20 2110)  fentaNYL (SUBLIMAZE) injection 50 mcg (50 mcg Intravenous Given 01/10/20 2111)  sodium chloride 0.9 % bolus 1,000 mL (1,000 mLs Intravenous New Bag/Given 01/10/20 2108)  acetaminophen (TYLENOL) tablet 650 mg (650 mg Oral Given 01/10/20 2110)  iohexol (OMNIPAQUE) 300 MG/ML solution 100 mL (100 mLs Intravenous Contrast Given 01/10/20 2201)    ED Course  I have reviewed the triage vital signs and the nursing notes.  Pertinent labs & imaging results that were available during my care of the patient were reviewed by me and considered in my medical decision making (see chart for details).    MDM Rules/Calculators/A&P                      TEIGHAN AUBERT is a 34 year old female with history of leukemia who presents to the ED with left flank pain.  Patient tachycardic, febrile.  Having some pain with urination.  States she does have a history of kidney stone.  Patient concerning vitals for sepsis.  Sepsis protocol initiated.  Given IV Rocephin.  Has been in remission for leukemia.  Will get lab work including blood cultures, CT scan abdomen pelvis to evaluate for kidney stone versus pyelonephritis versus other intra-abdominal process.  Will give IV fluids, IV fentanyl, Tylenol.  Patient with mild leukocytosis.   Urinalysis consistent with UTI.  CT scan showed no obvious infectious process.  Could not fully rule out a small nonobstructing stone.  Patient likely with pyelonephritis causing fever and tachycardia.  Patient still very symptomatic on reevaluation with nausea and pain.  Will give additional IV fluids, IV fentanyl, IV nausea medication have patient admitted to hospitalist for further care.  This chart was dictated using voice recognition software.  Despite best efforts to proofread,  errors can occur which can change the documentation meaning.    Final Clinical Impression(s) / ED Diagnoses Final diagnoses:  Pyelonephritis  Sepsis, due to unspecified organism, unspecified whether acute organ dysfunction present [facility])    Rx / DC Orders ED Discharge Orders    None       Lennice Sites, DO 01/10/20 2307

## 2020-01-11 ENCOUNTER — Encounter (HOSPITAL_COMMUNITY): Payer: Self-pay | Admitting: Internal Medicine

## 2020-01-11 DIAGNOSIS — Z885 Allergy status to narcotic agent status: Secondary | ICD-10-CM | POA: Diagnosis not present

## 2020-01-11 DIAGNOSIS — Z87891 Personal history of nicotine dependence: Secondary | ICD-10-CM | POA: Diagnosis not present

## 2020-01-11 DIAGNOSIS — Z6841 Body Mass Index (BMI) 40.0 and over, adult: Secondary | ICD-10-CM | POA: Diagnosis not present

## 2020-01-11 DIAGNOSIS — A4151 Sepsis due to Escherichia coli [E. coli]: Secondary | ICD-10-CM | POA: Diagnosis present

## 2020-01-11 DIAGNOSIS — Z841 Family history of disorders of kidney and ureter: Secondary | ICD-10-CM | POA: Diagnosis not present

## 2020-01-11 DIAGNOSIS — Z79899 Other long term (current) drug therapy: Secondary | ICD-10-CM | POA: Diagnosis not present

## 2020-01-11 DIAGNOSIS — Z8249 Family history of ischemic heart disease and other diseases of the circulatory system: Secondary | ICD-10-CM | POA: Diagnosis not present

## 2020-01-11 DIAGNOSIS — E1165 Type 2 diabetes mellitus with hyperglycemia: Secondary | ICD-10-CM | POA: Diagnosis not present

## 2020-01-11 DIAGNOSIS — E538 Deficiency of other specified B group vitamins: Secondary | ICD-10-CM | POA: Diagnosis present

## 2020-01-11 DIAGNOSIS — C9591 Leukemia, unspecified, in remission: Secondary | ICD-10-CM | POA: Diagnosis present

## 2020-01-11 DIAGNOSIS — E119 Type 2 diabetes mellitus without complications: Secondary | ICD-10-CM | POA: Diagnosis present

## 2020-01-11 DIAGNOSIS — N12 Tubulo-interstitial nephritis, not specified as acute or chronic: Secondary | ICD-10-CM | POA: Diagnosis present

## 2020-01-11 DIAGNOSIS — I10 Essential (primary) hypertension: Secondary | ICD-10-CM | POA: Diagnosis present

## 2020-01-11 DIAGNOSIS — Z820 Family history of epilepsy and other diseases of the nervous system: Secondary | ICD-10-CM | POA: Diagnosis not present

## 2020-01-11 DIAGNOSIS — Z794 Long term (current) use of insulin: Secondary | ICD-10-CM | POA: Diagnosis not present

## 2020-01-11 DIAGNOSIS — Z9049 Acquired absence of other specified parts of digestive tract: Secondary | ICD-10-CM | POA: Diagnosis not present

## 2020-01-11 DIAGNOSIS — Z833 Family history of diabetes mellitus: Secondary | ICD-10-CM | POA: Diagnosis not present

## 2020-01-11 DIAGNOSIS — Z20822 Contact with and (suspected) exposure to covid-19: Secondary | ICD-10-CM | POA: Diagnosis present

## 2020-01-11 DIAGNOSIS — D509 Iron deficiency anemia, unspecified: Secondary | ICD-10-CM | POA: Diagnosis present

## 2020-01-11 LAB — CREATININE, SERUM
Creatinine, Ser: 0.58 mg/dL (ref 0.44–1.00)
GFR calc Af Amer: >60 mL/min (ref >60)
GFR calc non Af Amer: >60 mL/min (ref >60)

## 2020-01-11 LAB — BLOOD CULTURE ID PANEL (REFLEXED)

## 2020-01-11 LAB — CBC
HCT: 32.7 % — ABNORMAL LOW (ref 36.0–46.0)
HCT: 31.1 % — ABNORMAL LOW (ref 36.0–46.0)
Hemoglobin: 10.7 g/dL — ABNORMAL LOW (ref 12.0–15.0)
Hemoglobin: 10.4 g/dL — ABNORMAL LOW (ref 12.0–15.0)
MCH: 29.6 pg (ref 26.0–34.0)
MCH: 30.1 pg (ref 26.0–34.0)
MCHC: 32.7 g/dL (ref 30.0–36.0)
MCHC: 33.4 g/dL (ref 30.0–36.0)
MCV: 90.6 fL (ref 80.0–100.0)
MCV: 89.9 fL (ref 80.0–100.0)
Platelets: 177 10*3/uL (ref 150–400)
Platelets: 185 10*3/uL (ref 150–400)
RBC: 3.61 MIL/uL — ABNORMAL LOW (ref 3.87–5.11)
RBC: 3.46 MIL/uL — ABNORMAL LOW (ref 3.87–5.11)
RDW: 12.9 % (ref 11.5–15.5)
RDW: 12.9 % (ref 11.5–15.5)
WBC: 11 10*3/uL — ABNORMAL HIGH (ref 4.0–10.5)
WBC: 12.5 10*3/uL — ABNORMAL HIGH (ref 4.0–10.5)
nRBC: 0 % (ref 0.0–0.2)
nRBC: 0 % (ref 0.0–0.2)

## 2020-01-11 LAB — BASIC METABOLIC PANEL
Anion gap: 9 (ref 5–15)
BUN: 8 mg/dL (ref 6–20)
CO2: 21 mmol/L — ABNORMAL LOW (ref 22–32)
Calcium: 8.1 mg/dL — ABNORMAL LOW (ref 8.9–10.3)
Chloride: 106 mmol/L (ref 98–111)
Creatinine, Ser: 0.71 mg/dL (ref 0.44–1.00)
GFR calc Af Amer: >60 mL/min (ref >60)
GFR calc non Af Amer: >60 mL/min (ref >60)
Glucose, Bld: 180 mg/dL — ABNORMAL HIGH (ref 70–99)
Potassium: 3.7 mmol/L (ref 3.5–5.1)
Sodium: 136 mmol/L (ref 135–145)

## 2020-01-11 LAB — HIV ANTIBODY (ROUTINE TESTING W REFLEX): HIV Screen 4th Generation wRfx: NONREACTIVE

## 2020-01-11 LAB — CBG MONITORING, ED
Glucose-Capillary: 139 mg/dL — ABNORMAL HIGH (ref 70–99)
Glucose-Capillary: 156 mg/dL — ABNORMAL HIGH (ref 70–99)

## 2020-01-11 LAB — GLUCOSE, CAPILLARY
Glucose-Capillary: 112 mg/dL — ABNORMAL HIGH (ref 70–99)
Glucose-Capillary: 171 mg/dL — ABNORMAL HIGH (ref 70–99)

## 2020-01-11 MED ORDER — ONDANSETRON HCL 4 MG/2ML IJ SOLN: 4.0000 mg | Freq: Four times a day (QID) | INTRAMUSCULAR | Status: DC | PRN

## 2020-01-11 MED ORDER — SODIUM CHLORIDE 0.9 % IV SOLN
2.0000 g | INTRAVENOUS | Status: DC
  Administered 2020-01-11 – 2020-01-13 (×3): 2 g via INTRAVENOUS
  Filled 2020-01-11 (×3): qty 2

## 2020-01-11 MED ORDER — SODIUM CHLORIDE 0.9 % IV SOLN: INTRAVENOUS | Status: DC

## 2020-01-11 MED ORDER — INSULIN GLARGINE 100 UNIT/ML ~~LOC~~ SOLN
32.0000 [IU] | Freq: Every day | SUBCUTANEOUS | Status: DC
  Administered 2020-01-11 – 2020-01-13 (×4): 32 [IU] via SUBCUTANEOUS
  Filled 2020-01-11 (×5): qty 0.32

## 2020-01-11 MED ORDER — ENOXAPARIN SODIUM 40 MG/0.4ML ~~LOC~~ SOLN
40.0000 mg | Freq: Every day | SUBCUTANEOUS | Status: DC
  Administered 2020-01-11: 40 mg via SUBCUTANEOUS
  Filled 2020-01-11: qty 0.4

## 2020-01-11 MED ORDER — LISINOPRIL 10 MG PO TABS
10.0000 mg | ORAL_TABLET | Freq: Every day | ORAL | Status: DC
  Administered 2020-01-11: 10 mg via ORAL
  Filled 2020-01-11: qty 1

## 2020-01-11 MED ORDER — ACETAMINOPHEN 325 MG PO TABS
650.0000 mg | ORAL_TABLET | Freq: Four times a day (QID) | ORAL | Status: DC | PRN
  Administered 2020-01-11 – 2020-01-14 (×6): 650 mg via ORAL
  Filled 2020-01-11 (×6): qty 2

## 2020-01-11 MED ORDER — SODIUM CHLORIDE 0.9 % IV BOLUS
500.0000 mL | Freq: Once | INTRAVENOUS | Status: AC
  Administered 2020-01-11: 500 mL via INTRAVENOUS

## 2020-01-11 MED ORDER — ONDANSETRON HCL 4 MG PO TABS: 4.0000 mg | ORAL_TABLET | Freq: Four times a day (QID) | ORAL | Status: DC | PRN

## 2020-01-11 MED ORDER — INSULIN ASPART 100 UNIT/ML ~~LOC~~ SOLN
0.0000 [IU] | Freq: Three times a day (TID) | SUBCUTANEOUS | Status: DC
  Administered 2020-01-11: 1 [IU] via SUBCUTANEOUS
  Administered 2020-01-11: 2 [IU] via SUBCUTANEOUS
  Administered 2020-01-12: 1 [IU] via SUBCUTANEOUS
  Administered 2020-01-12: 2 [IU] via SUBCUTANEOUS
  Administered 2020-01-12 – 2020-01-14 (×4): 1 [IU] via SUBCUTANEOUS
  Filled 2020-01-11: qty 0.09

## 2020-01-11 MED ORDER — ENOXAPARIN SODIUM 60 MG/0.6ML ~~LOC~~ SOLN
60.0000 mg | Freq: Every day | SUBCUTANEOUS | Status: DC
  Administered 2020-01-11 – 2020-01-13 (×3): 60 mg via SUBCUTANEOUS
  Filled 2020-01-11 (×3): qty 0.6

## 2020-01-11 MED ORDER — FENTANYL CITRATE (PF) 100 MCG/2ML IJ SOLN
25.0000 ug | INTRAMUSCULAR | Status: DC | PRN
  Administered 2020-01-11 – 2020-01-13 (×6): 25 ug via INTRAVENOUS
  Filled 2020-01-11 (×6): qty 2

## 2020-01-11 NOTE — Progress Notes (Signed)
PHARMACY - PHYSICIAN COMMUNICATION CRITICAL VALUE ALERT - BLOOD CULTURE IDENTIFICATION (BCID)  Melissa Washington is an 34 y.o. female who presented to [facility] on 01/10/2020 with a chief complaint of left flank pain  Assessment:  34 y.o. female with history of diabetes mellitus type 2, hypertension promyelocytic leukemia under remission presents to the ER with complaint of left flank pain over the last 24 hours    Name of physician (or Provider) Contacted: Dr. Tyrell Antonio  Current antibiotics: Ceftriaxone 2 gr IV q24h   Changes to prescribed antibiotics recommended:  Patient is on recommended antibiotics - No changes needed  Results for orders placed or performed during the hospital encounter of 01/10/20  Blood Culture ID Panel (Reflexed) (Collected: 01/10/2020  8:57 PM)  Result Value Ref Range   Enterococcus species NOT DETECTED NOT DETECTED   Listeria monocytogenes NOT DETECTED NOT DETECTED   Staphylococcus species NOT DETECTED NOT DETECTED   Staphylococcus aureus (BCID) NOT DETECTED NOT DETECTED   Streptococcus species NOT DETECTED NOT DETECTED   Streptococcus agalactiae NOT DETECTED NOT DETECTED   Streptococcus pneumoniae NOT DETECTED NOT DETECTED   Streptococcus pyogenes NOT DETECTED NOT DETECTED   Acinetobacter baumannii NOT DETECTED NOT DETECTED   Enterobacteriaceae species DETECTED (A) NOT DETECTED   Enterobacter cloacae complex NOT DETECTED NOT DETECTED   Escherichia coli DETECTED (A) NOT DETECTED   Klebsiella oxytoca NOT DETECTED NOT DETECTED   Klebsiella pneumoniae NOT DETECTED NOT DETECTED   Proteus species NOT DETECTED NOT DETECTED   Serratia marcescens NOT DETECTED NOT DETECTED   Carbapenem resistance NOT DETECTED NOT DETECTED   Haemophilus influenzae NOT DETECTED NOT DETECTED   Neisseria meningitidis NOT DETECTED NOT DETECTED   Pseudomonas aeruginosa NOT DETECTED NOT DETECTED   Candida albicans NOT DETECTED NOT DETECTED   Candida glabrata NOT DETECTED NOT DETECTED   Candida krusei NOT DETECTED NOT DETECTED   Candida parapsilosis NOT DETECTED NOT DETECTED   Candida tropicalis NOT DETECTED NOT DETECTED    Royetta Asal, PharmD, BCPS 01/11/2020 12:41 PM

## 2020-01-11 NOTE — H&P (Signed)
History and Physical    Melissa Washington ZOX:[id number] DOB: 13-Jun-1986 DOA: 01/10/2020  PCP: [facility]  Patient coming from: Home.  Chief Complaint: Left flank pain.  HPI: Melissa Washington is a 34 y.o. female with history of diabetes mellitus type 2, hypertension promyelocytic leukemia under remission presents to the ER with complaint of left flank pain over the last 24 hours.  Have one episode of vomiting in the ER.  Had subjective feeling of fever chills.  Denies any dysuria or discharge.  ED Course: On exam patient in the ER has left flank pain tenderness and had 1 episode of vomiting.  CT abdomen pelvis was unremarkable.  Labs reveal WBC count of 11.8 lactic acid normal complete metabolic panel shows blood glucose of 156 otherwise unremarkable.  UA is consistent with UTI.  Given that patient had vomiting and ongoing left flank tenderness with diabetes admitted for further observation with IV antibiotics.  Review of Systems: As per HPI, rest all negative.   Past Medical History:  Diagnosis Date  . 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 Bacteremia associated with IV line ([place]) 05/01/2013   Port-a-cath infection, removed 8/6 by IR. Ecoli grew out in cultures. Sent home with Ceftriaxone to complete 14 day course.   . Bacteremia due to Escherichia coli 05/01/2013   Port-a-cath infection, removed 8/6 by IR. Ecoli grew out in cultures. Sent home with Ceftriaxone to complete 14 day course.   . Cholelithiasis 01/17/2012  . Collagen vascular disease ([place])   . Diabetes mellitus    type 2, insulin  . Headache(784.0)   . Hepatic steatosis 01/17/2012  . Hypertension   . Leukemia, acute monocytic, in remission ([place])   . Nephrolithiasis   . Obesity     Past Surgical History:  Procedure Laterality Date  . CHOLECYSTECTOMY  01/31/2012   Procedure: LAPAROSCOPIC CHOLECYSTECTOMY WITH INTRAOPERATIVE CHOLANGIOGRAM;  Surgeon: Marcello Moores A. Cornett, MD;   Location: [facility];  Service: General;  Laterality: N/A;  . PORTACATH PLACEMENT     [place]  . portacath removal       reports that she has quit smoking. She has never used smokeless tobacco. She reports that she does not drink alcohol or use drugs.  Allergies  Allergen Reactions  . Ultram [Tramadol Hcl] Nausea And Vomiting    Family History  Problem Relation Age of Onset  . Kidney disease Mother   . Hypertension Mother   . Epilepsy Mother   . Sleep apnea Mother   . Diabetes Father   . Kidney disease Maternal Grandmother   . Diabetes Maternal Grandmother     Prior to Admission medications   Medication Sig Start Date End Date Taking? Authorizing Provider  Insulin Glargine (LANTUS) 100 UNIT/ML Solostar Pen Inject 30 Units into the skin daily at 10 pm. Patient taking differently: Inject 32 Units into the skin daily at 10 pm.  04/11/17 09/18/23 Yes Dessa Phi, DO  levonorgestrel (MIRENA) 20 MCG/24HR IUD 1 each by Intrauterine route continuous.   Yes [provider]  lisinopril (ZESTRIL) 10 MG tablet Take 10 mg by mouth daily. 11/10/19  Yes [provider]  metFORMIN (GLUCOPHAGE-XR) 500 MG 24 hr tablet Take 500 mg by mouth 2 (two) times daily. 11/10/19  Yes [provider]  NOVOLOG FLEXPEN 100 UNIT/ML FlexPen Inject 3-20 Units into the skin in the morning, at noon, and at bedtime. 11/10/19  Yes [provider]  blood glucose meter kit and supplies  Dispense based on patient and insurance preference. Use up to four times daily as directed. (FOR ICD-9 250.00, 250.01). 04/11/17   Dessa Phi, DO  cetirizine (ZYRTEC) 10 MG tablet Take 1 tablet (10 mg total) by mouth daily. Patient not taking: Reported on 01/10/2020 01/27/18   Augusto Gamble B, NP  diclofenac sodium (VOLTAREN) 1 % GEL Apply 2 g topically 4 (four) times daily. Patient not taking: Reported on 11/10/2018 09/28/18   Mcarthur Rossetti, MD  doxycycline (VIBRAMYCIN) 100 MG capsule Take 1 capsule (100  mg total) by mouth 2 (two) times daily. Patient not taking: Reported on 01/10/2020 02/02/19   Loura Halt A, NP  Insulin Pen Needle 31G X 5 MM MISC Use with insulin, 4 times daily 04/11/17   Dessa Phi, DO    Physical Exam: Constitutional: Moderately built and nourished. Vitals:   01/10/20 2315 01/10/20 2319 01/10/20 2330 01/11/20 0000  BP:   112/72 121/88  Pulse: 90 96 93   Resp: (!) 21 20  (!) 22  Temp:      TempSrc:      SpO2: 98% 97% 94%    Eyes: Anicteric no pallor. ENMT: No discharge from the ears eyes nose or mouth. Neck: No mass felt.  No neck rigidity. Respiratory: No rhonchi or crepitations. Cardiovascular: S1-S2 heard. Abdomen: Left flank tenderness no guarding or rigidity. Musculoskeletal: No edema. Skin: No rash. Neurologic: Alert awake oriented time place and person.  Moves all extremities. Psychiatric: Appears normal.  Normal affect.   Labs on Admission: I have personally reviewed following labs and imaging studies  CBC: Recent Labs  Lab 01/10/20 1851  WBC 11.8*  HGB 11.8*  HCT 34.8*  MCV 88.3  PLT 564   Basic Metabolic Panel: Recent Labs  Lab 01/10/20 1851  NA 137  K 3.7  CL 103  CO2 25  GLUCOSE 156*  BUN 11  CREATININE 0.71  CALCIUM 9.1   GFR: CrCl cannot be calculated (Unknown ideal weight.). Liver Function Tests: Recent Labs  Lab 01/10/20 1851  AST 26  ALT 30  ALKPHOS 65  BILITOT 1.5*  PROT 7.7  ALBUMIN 4.4   Recent Labs  Lab 01/10/20 1851  LIPASE 47   No results for input(s): AMMONIA in the last 168 hours. Coagulation Profile: Recent Labs  Lab 01/10/20 2057  INR 1.1   Cardiac Enzymes: No results for input(s): CKTOTAL, CKMB, CKMBINDEX, TROPONINI in the last 168 hours. BNP (last 3 results) No results for input(s): PROBNP in the last 8760 hours. HbA1C: No results for input(s): HGBA1C in the last 72 hours. CBG: No results for input(s): GLUCAP in the last 168 hours. Lipid Profile: No results for input(s): CHOL,  HDL, LDLCALC, TRIG, CHOLHDL, LDLDIRECT in the last 72 hours. Thyroid Function Tests: No results for input(s): TSH, T4TOTAL, FREET4, T3FREE, THYROIDAB in the last 72 hours. Anemia Panel: No results for input(s): VITAMINB12, FOLATE, FERRITIN, TIBC, IRON, RETICCTPCT in the last 72 hours. Urine analysis:    Component Value Date/Time   COLORURINE YELLOW 01/10/2020 2057   APPEARANCEUR CLOUDY (A) 01/10/2020 2057   LABSPEC 1.009 01/10/2020 2057   PHURINE 6.0 01/10/2020 2057   GLUCOSEU NEGATIVE 01/10/2020 2057   HGBUR MODERATE (A) 01/10/2020 2057   HGBUR moderate 01/29/2009 1517   BILIRUBINUR NEGATIVE 01/10/2020 2057   BILIRUBINUR NEG 01/19/2013 [place] 01/10/2020 2057   PROTEINUR 30 (A) 01/10/2020 2057   UROBILINOGEN 0.2 01/31/2019 1052   NITRITE NEGATIVE 01/10/2020 2057   LEUKOCYTESUR LARGE (A) 01/10/2020  2057   Sepsis Labs: '@LABRCNTIP' (procalcitonin:4,lacticidven:4) ) Recent Results (from the past 240 hour(s))  Respiratory Panel by RT PCR (Flu A&B, Covid) - Nasopharyngeal Swab     Status: None   Collection Time: 01/10/20  8:21 PM   Specimen: Nasopharyngeal Swab  Result Value Ref Range Status   SARS Coronavirus 2 by RT PCR NEGATIVE NEGATIVE Final    Comment: (NOTE) SARS-CoV-2 target nucleic acids are NOT DETECTED. The SARS-CoV-2 RNA is generally detectable in upper respiratoy specimens during the acute phase of infection. The lowest concentration of SARS-CoV-2 viral copies this assay can detect is 131 copies/mL. A negative result does not preclude SARS-Cov-2 infection and should not be used as the sole basis for treatment or other patient management decisions. A negative result may occur with  improper specimen collection/handling, submission of specimen other than nasopharyngeal swab, presence of viral mutation(s) within the areas targeted by this assay, and inadequate number of viral copies (<131 copies/mL). A negative result must be combined with  clinical observations, patient history, and epidemiological information. The expected result is Negative. Fact Sheet for Patients:  PinkCheek.be Fact Sheet for Healthcare Providers:  GravelBags.it This test is not yet ap proved or cleared by the [place] FDA and  has been authorized for detection and/or diagnosis of SARS-CoV-2 by FDA under an Emergency Use Authorization (EUA). This EUA will remain  in effect (meaning this test can be used) for the duration of the COVID-19 declaration under Section 564(b)(1) of the Act, 21 U.S.C. section 360bbb-3(b)(1), unless the authorization is terminated or revoked sooner.    Influenza A by PCR NEGATIVE NEGATIVE Final   Influenza B by PCR NEGATIVE NEGATIVE Final    Comment: (NOTE) The Xpert Xpress SARS-CoV-2/FLU/RSV assay is intended as an aid in  the diagnosis of influenza from Nasopharyngeal swab specimens and  should not be used as a sole basis for treatment. Nasal washings and  aspirates are unacceptable for Xpert Xpress SARS-CoV-2/FLU/RSV  testing. Fact Sheet for Patients: PinkCheek.be Fact Sheet for Healthcare Providers: GravelBags.it This test is not yet approved or cleared by the [place] FDA and  has been authorized for detection and/or diagnosis of SARS-CoV-2 by  FDA under an Emergency Use Authorization (EUA). This EUA will remain  in effect (meaning this test can be used) for the duration of the  Covid-19 declaration under Section 564(b)(1) of the Act, 21  U.S.C. section 360bbb-3(b)(1), unless the authorization is  terminated or revoked. Performed at [facility], [place] [address], [place], [place] [address]      Radiological Exams on Admission: CT ABDOMEN PELVIS W CONTRAST  Result Date: 01/10/2020 CLINICAL DATA:  Left-sided flank pain since yesterday, history of leukemia EXAM: CT ABDOMEN  AND PELVIS WITH CONTRAST TECHNIQUE: Multidetector CT imaging of the abdomen and pelvis was performed using the standard protocol following bolus administration of intravenous contrast. Imaging was delayed as patient vomited after contrast administration. CONTRAST:  139m OMNIPAQUE IOHEXOL 300 MG/ML  SOLN COMPARISON:  09/17/2017 FINDINGS: Lower chest: No acute pleural or parenchymal lung disease. Hepatobiliary: No focal liver abnormality is seen. Status post cholecystectomy. No biliary dilatation. Pancreas: Unremarkable. No pancreatic ductal dilatation or surrounding inflammatory changes. Spleen: Normal in size without focal abnormality. Adrenals/Urinary Tract: Because of the delayed nature of the exam, contrast is already excreted by the kidneys at the time of the study. There is no high-grade obstruction. Small nonobstructing calculi could be obscured by the excreted contrast. The adrenals are unremarkable. Bladder is normal. Stomach/Bowel: Normal  appendix right lower quadrant. No bowel obstruction or ileus. No bowel wall thickening or inflammatory change. Vascular/Lymphatic: No significant vascular findings are present. No enlarged abdominal or pelvic lymph nodes. Reproductive: IUD within the uterus. No adnexal masses. Other: No abdominal wall hernia or abnormality. No abdominopelvic ascites. Musculoskeletal: No acute or destructive bony lesions. Reconstructed images demonstrate no additional findings. IMPRESSION: 1. No acute intra-abdominal or intrapelvic process. 2. Exam was delayed due to patient vomiting after contrast administration. Small nonobstructing urinary tract calculi could be obscured by the excreted contrast. Electronically Signed   By: Randa Ngo M.D.   On: 01/10/2020 22:32   DG Chest Port 1 View  Result Date: 01/10/2020 CLINICAL DATA:  Fever.  Hypertension. EXAM: PORTABLE CHEST 1 VIEW COMPARISON:  04/07/2017 FINDINGS: Midline trachea. Normal heart size for level of inspiration. Breast  tissue overlies the lower lungs. No pleural effusion or pneumothorax. Clear lungs. IMPRESSION: No acute cardiopulmonary disease. Electronically Signed   By: Abigail Miyamoto M.D.   On: 01/10/2020 20:36    EKG: Independently reviewed.  Normal sinus rhythm.  Rate.  Assessment/Plan Principal Problem:   Pyelonephritis Active Problems:   DM (diabetes mellitus), type 2, uncontrolled ([place])   HYPERTENSION, BENIGN ESSENTIAL   Morbid obesity with BMI of 50.0-59.9, adult ([place])    1. Pyelonephritis -patient symptoms are consistent pyelonephritis for which patient is on ceftriaxone follow cultures. 2. Nausea vomiting 1 episode likely from pyelonephritis CT abdomen pelvis unremarkable.  Closely monitor. 3. Hypertension on lisinopril. 4. Diabetes mellitus type 2 on Lantus and sliding scale coverage. 5. Anemia follow CBC. 6. Morbid obesity will need further counseling. 7. History of promyelocytic leukemia in remission.   DVT prophylaxis: Lovenox. Code Status: Full code. Family Communication: Discussed with patient. Disposition Plan: Home. Consults called: None. Admission status: Observation.   Rise Patience MD [facility] Pager [phone].  If 7PM-7AM, please contact night-coverage [email or web address] Password [facility]  01/11/2020, 12:35 AM

## 2020-01-11 NOTE — Plan of Care (Signed)
Patient medicated for pain x 1 this shift with improvement.  Fever T max of 102.1, responded to Tylenol.

## 2020-01-11 NOTE — Progress Notes (Signed)
PROGRESS NOTE    Melissa Washington  [id number] DOB: 22-May-1986 DOA: 01/10/2020 [facility]: [facility]   Brief Narrative: 34 year old with past medical history significant for diabetes type 2, hypertension, promyelocytic leukemia under remission presented to the ED complaining of left flank pain, vomiting and subjective fever.  Patient presented with leukocytosis, lactic acid normal admitted for pyelonephritis.   Assessment & Plan:   Principal Problem:   Pyelonephritis Active Problems:   DM (diabetes mellitus), type 2, uncontrolled ([place])   HYPERTENSION, BENIGN ESSENTIAL   Morbid obesity with BMI of 50.0-59.9, adult ([facility])  1-Sepsis, E. coli bacteremia, pyelonephritis; Patient presented with fever temperature 102, tachycardia, tachypnea respiration 34.  Source of infection likely pyelonephritis and E. coli bacteremia Continue with IV fluids.  IV antibiotics with ceftriaxone. Follow blood cultures. CT abdomen and pelvis unrevealing.  2-Nausea and vomiting related to pyelonephritis, continue with fluid support care  3-E coli bacteremia: Continue with IV fluids, IV antibiotics. She will need repeated blood cultures  4-History of promyelocytic leukemia in remission.  5-Diabetes: Continue with a sliding scale insulin and lantus.  6-Hypertension: Hold lisinopril 7-Anemia: check anemia panel.    Estimated body mass index is 51.02 kg/m as calculated from the following:   Height as of 11/10/18: 5\' 1"  (1.549 m).   Weight as of 01/31/19: 122.5 kg.   DVT prophylaxis: Lovenox Code Status: Full code Family Communication: Care discussed with patient Disposition Plan:  Patient is from: Home Anticipated d/c date: Home in 2 or 3 days after resolution of bacteremia Barriers to d/c or necessity for inpatient status: Remain in the hospital for IV antibiotics, patient found to have E. coli bacteremia  Consultants:   None  Procedures:    None  Antimicrobials:  Ceftriaxone  Subjective: She is alert, she is still complaining of left side flank pain.  She vomited last night.  Objective: Vitals:   01/11/20 0400 01/11/20 0647 01/11/20 0700 01/11/20 0805  BP: (!) 153/81 111/76 113/77 106/74  Pulse: (!) 101 (!) 102 98 95  Resp: (!) 7 19 14 11   Temp:   99.2 F (37.3 C)   TempSrc:   Oral   SpO2: 90% 97% 96% 98%    Intake/Output Summary (Last 24 hours) at 01/11/2020 [id number] Last data filed at 01/11/2020 0200 Gross per 24 hour  Intake 2200 ml  Output --  Net 2200 ml   There were no vitals filed for this visit.  Examination:  General exam: Appears calm and comfortable  Respiratory system: Clear to auscultation. Respiratory effort normal. Cardiovascular system: S1 & S2 heard, RRR. No JVD, murmurs, rubs, gallops or clicks. No pedal edema. Gastrointestinal system: Abdomen is nondistended, soft and nontender. No organomegaly or masses felt. Normal bowel sounds heard.  Flank tenderness Central nervous system: Alert and oriented. No focal neurological deficits. Extremities: Symmetric 5 x 5 power. Skin: No rashes, lesions or ulcers    Data Reviewed: I have personally reviewed following labs and imaging studies  CBC: Recent Labs  Lab 01/10/20 1851 01/11/20 0033 01/11/20 0459  WBC 11.8* 11.0* 12.5*  HGB 11.8* 10.7* 10.4*  HCT 34.8* 32.7* 31.1*  MCV 88.3 90.6 89.9  PLT 212 177 [id number]   Basic Metabolic Panel: Recent Labs  Lab 01/10/20 1851 01/11/20 0033 01/11/20 0459  NA 137  --  136  K 3.7  --  3.7  CL 103  --  106  CO2 25  --  21*  GLUCOSE 156*  --  180*  BUN 11  --  8  CREATININE 0.71 0.58 0.71  CALCIUM 9.1  --  8.1*   GFR: CrCl cannot be calculated (Unknown ideal weight.). Liver Function Tests: Recent Labs  Lab 01/10/20 1851  AST 26  ALT 30  ALKPHOS 65  BILITOT 1.5*  PROT 7.7  ALBUMIN 4.4   Recent Labs  Lab 01/10/20 1851  LIPASE 47   No results for input(s): AMMONIA in the last 168  hours. Coagulation Profile: Recent Labs  Lab 01/10/20 2057  INR 1.1   Cardiac Enzymes: No results for input(s): CKTOTAL, CKMB, CKMBINDEX, TROPONINI in the last 168 hours. BNP (last 3 results) No results for input(s): PROBNP in the last 8760 hours. HbA1C: No results for input(s): HGBA1C in the last 72 hours. CBG: Recent Labs  Lab 01/11/20 0750  GLUCAP 139*   Lipid Profile: No results for input(s): CHOL, HDL, LDLCALC, TRIG, CHOLHDL, LDLDIRECT in the last 72 hours. Thyroid Function Tests: No results for input(s): TSH, T4TOTAL, FREET4, T3FREE, THYROIDAB in the last 72 hours. Anemia Panel: No results for input(s): VITAMINB12, FOLATE, FERRITIN, TIBC, IRON, RETICCTPCT in the last 72 hours. Sepsis Labs: Recent Labs  Lab 01/10/20 2057  LATICACIDVEN 1.5    Recent Results (from the past 240 hour(s))  Respiratory Panel by RT PCR (Flu A&B, Covid) - Nasopharyngeal Swab     Status: None   Collection Time: 01/10/20  8:21 PM   Specimen: Nasopharyngeal Swab  Result Value Ref Range Status   SARS Coronavirus 2 by RT PCR NEGATIVE NEGATIVE Final    Comment: (NOTE) SARS-CoV-2 target nucleic acids are NOT DETECTED. The SARS-CoV-2 RNA is generally detectable in upper respiratoy specimens during the acute phase of infection. The lowest concentration of SARS-CoV-2 viral copies this assay can detect is 131 copies/mL. A negative result does not preclude SARS-Cov-2 infection and should not be used as the sole basis for treatment or other patient management decisions. A negative result may occur with  improper specimen collection/handling, submission of specimen other than nasopharyngeal swab, presence of viral mutation(s) within the areas targeted by this assay, and inadequate number of viral copies (<131 copies/mL). A negative result must be combined with clinical observations, patient history, and epidemiological information. The expected result is Negative. Fact Sheet for Patients:   PinkCheek.be Fact Sheet for Healthcare Providers:  GravelBags.it This test is not yet ap proved or cleared by the [place] FDA and  has been authorized for detection and/or diagnosis of SARS-CoV-2 by FDA under an Emergency Use Authorization (EUA). This EUA will remain  in effect (meaning this test can be used) for the duration of the COVID-19 declaration under Section 564(b)(1) of the Act, 21 U.S.C. section 360bbb-3(b)(1), unless the authorization is terminated or revoked sooner.    Influenza A by PCR NEGATIVE NEGATIVE Final   Influenza B by PCR NEGATIVE NEGATIVE Final    Comment: (NOTE) The Xpert Xpress SARS-CoV-2/FLU/RSV assay is intended as an aid in  the diagnosis of influenza from Nasopharyngeal swab specimens and  should not be used as a sole basis for treatment. Nasal washings and  aspirates are unacceptable for Xpert Xpress SARS-CoV-2/FLU/RSV  testing. Fact Sheet for Patients: PinkCheek.be Fact Sheet for Healthcare Providers: GravelBags.it This test is not yet approved or cleared by the [place] FDA and  has been authorized for detection and/or diagnosis of SARS-CoV-2 by  FDA under an Emergency Use Authorization (EUA). This EUA will remain  in effect (meaning this test can be used) for the duration of the  Covid-19 declaration under  Section 564(b)(1) of the Act, 21  U.S.C. section 360bbb-3(b)(1), unless the authorization is  terminated or revoked. Performed at [facility], [place] [address] [place], [place] [address]   Blood Culture (routine x 2)     Status: None (Preliminary result)   Collection Time: 01/10/20  8:57 PM   Specimen: BLOOD  Result Value Ref Range Status   Specimen Description   Final    BLOOD LEFT ANTECUBITAL Performed at [facility], [place] [address], [place], [place] [address]    Special Requests   Final     BOTTLES DRAWN AEROBIC AND ANAEROBIC Blood Culture adequate volume Performed at [facility] [address], [place], [place] [address]    Culture   Final    NO GROWTH < 12 HOURS Performed at [facility] [address] [place], [place] [address]    Report Status PENDING  Incomplete  Blood Culture (routine x 2)     Status: None (Preliminary result)   Collection Time: 01/10/20  8:57 PM   Specimen: BLOOD  Result Value Ref Range Status   Specimen Description   Final    BLOOD RIGHT ANTECUBITAL Performed at [facility], [place] [address] [place], [place] [address]    Special Requests   Final    BOTTLES DRAWN AEROBIC AND ANAEROBIC Blood Culture adequate volume Performed at [facility] [address], [place], [place] [address]    Culture   Final    NO GROWTH < 12 HOURS Performed at [facility] [address], [place], [place] [address]    Report Status PENDING  Incomplete         Radiology Studies: CT ABDOMEN PELVIS W CONTRAST  Result Date: 01/10/2020 CLINICAL DATA:  Left-sided flank pain since yesterday, history of leukemia EXAM: CT ABDOMEN AND PELVIS WITH CONTRAST TECHNIQUE: Multidetector CT imaging of the abdomen and pelvis was performed using the standard protocol following bolus administration of intravenous contrast. Imaging was delayed as patient vomited after contrast administration. CONTRAST:  124mL OMNIPAQUE IOHEXOL 300 MG/ML  SOLN COMPARISON:  09/17/2017 FINDINGS: Lower chest: No acute pleural or parenchymal lung disease. Hepatobiliary: No focal liver abnormality is seen. Status post cholecystectomy. No biliary dilatation. Pancreas: Unremarkable. No pancreatic ductal dilatation or surrounding inflammatory changes. Spleen: Normal in size without focal abnormality. Adrenals/Urinary Tract: Because of the delayed nature of the exam, contrast is already excreted by the kidneys at the time of the study. There is no  high-grade obstruction. Small nonobstructing calculi could be obscured by the excreted contrast. The adrenals are unremarkable. Bladder is normal. Stomach/Bowel: Normal appendix right lower quadrant. No bowel obstruction or ileus. No bowel wall thickening or inflammatory change. Vascular/Lymphatic: No significant vascular findings are present. No enlarged abdominal or pelvic lymph nodes. Reproductive: IUD within the uterus. No adnexal masses. Other: No abdominal wall hernia or abnormality. No abdominopelvic ascites. Musculoskeletal: No acute or destructive bony lesions. Reconstructed images demonstrate no additional findings. IMPRESSION: 1. No acute intra-abdominal or intrapelvic process. 2. Exam was delayed due to patient vomiting after contrast administration. Small nonobstructing urinary tract calculi could be obscured by the excreted contrast. Electronically Signed   By: Randa Ngo M.D.   On: 01/10/2020 22:32   DG Chest Port 1 View  Result Date: 01/10/2020 CLINICAL DATA:  Fever.  Hypertension. EXAM: PORTABLE CHEST 1 VIEW COMPARISON:  04/07/2017 FINDINGS: Midline trachea. Normal heart size for level of inspiration. Breast tissue overlies the lower  lungs. No pleural effusion or pneumothorax. Clear lungs. IMPRESSION: No acute cardiopulmonary disease. Electronically Signed   By: Abigail Miyamoto M.D.   On: 01/10/2020 20:36        Scheduled Meds: . enoxaparin (LOVENOX) injection  40 mg Subcutaneous QHS  . insulin aspart  0-9 Units Subcutaneous TID WC  . insulin glargine  32 Units Subcutaneous Q2200  . lisinopril  10 mg Oral Daily  . sodium chloride (PF)      . sodium chloride flush  3 mL Intravenous Once   Continuous Infusions: . sodium chloride 100 mL/hr at 01/11/20 0204  . cefTRIAXone (ROCEPHIN)  IV       LOS: 0 days    Time spent: 35 minutes    Alaira Level A Archer Vise, MD [facility]   If 7PM-7AM, please contact night-coverage [email or web address]  01/11/2020, 8:21 AM

## 2020-01-11 NOTE — ED Notes (Signed)
Ambulated to BR independently and safely. Gait steady. Returned to room without incident. FSBS obtained and medicated with Novolog a/o. VSS. NAD. No complaints at this time. Cont to monitor.

## 2020-01-12 LAB — CBC
HCT: 27.5 % — ABNORMAL LOW (ref 36.0–46.0)
Hemoglobin: 9 g/dL — ABNORMAL LOW (ref 12.0–15.0)
MCH: 29.5 pg (ref 26.0–34.0)
MCHC: 32.7 g/dL (ref 30.0–36.0)
MCV: 90.2 fL (ref 80.0–100.0)
Platelets: 163 10*3/uL (ref 150–400)
RBC: 3.05 MIL/uL — ABNORMAL LOW (ref 3.87–5.11)
RDW: 12.8 % (ref 11.5–15.5)
WBC: 6.7 10*3/uL (ref 4.0–10.5)
nRBC: 0 % (ref 0.0–0.2)

## 2020-01-12 LAB — BASIC METABOLIC PANEL
Anion gap: 9 (ref 5–15)
BUN: 10 mg/dL (ref 6–20)
CO2: 21 mmol/L — ABNORMAL LOW (ref 22–32)
Calcium: 8.5 mg/dL — ABNORMAL LOW (ref 8.9–10.3)
Chloride: 108 mmol/L (ref 98–111)
Creatinine, Ser: 0.69 mg/dL (ref 0.44–1.00)
GFR calc Af Amer: >60 mL/min (ref >60)
GFR calc non Af Amer: >60 mL/min (ref >60)
Glucose, Bld: 212 mg/dL — ABNORMAL HIGH (ref 70–99)
Potassium: 3.7 mmol/L (ref 3.5–5.1)
Sodium: 138 mmol/L (ref 135–145)

## 2020-01-12 LAB — URINE CULTURE

## 2020-01-12 LAB — RETICULOCYTES
Immature Retic Fract: 9.5 % (ref 2.3–15.9)
RBC.: 3.09 MIL/uL — ABNORMAL LOW (ref 3.87–5.11)
Retic Count, Absolute: 33.4 10*3/uL (ref 19.0–186.0)
Retic Ct Pct: 1.1 % (ref 0.4–3.1)

## 2020-01-12 LAB — FERRITIN: Ferritin: 675 ng/mL — ABNORMAL HIGH (ref 11–307)

## 2020-01-12 LAB — IRON AND TIBC
Iron: 26 ug/dL — ABNORMAL LOW (ref 28–170)
Saturation Ratios: 10 % — ABNORMAL LOW (ref 10.4–31.8)
TIBC: 260 ug/dL (ref 250–450)
UIBC: 234 ug/dL

## 2020-01-12 LAB — GLUCOSE, CAPILLARY
Glucose-Capillary: 128 mg/dL — ABNORMAL HIGH (ref 70–99)
Glucose-Capillary: 165 mg/dL — ABNORMAL HIGH (ref 70–99)
Glucose-Capillary: 146 mg/dL — ABNORMAL HIGH (ref 70–99)
Glucose-Capillary: 158 mg/dL — ABNORMAL HIGH (ref 70–99)

## 2020-01-12 LAB — FOLATE: Folate: 6.9 ng/mL (ref >5.9)

## 2020-01-12 LAB — VITAMIN B12: Vitamin B-12: 183 pg/mL (ref 180–914)

## 2020-01-12 MED ORDER — VITAMIN B-12 100 MCG PO TABS
100.0000 ug | ORAL_TABLET | Freq: Every day | ORAL | Status: DC
  Administered 2020-01-12 – 2020-01-14 (×3): 100 ug via ORAL
  Filled 2020-01-12 (×3): qty 1

## 2020-01-12 MED ORDER — FERROUS SULFATE 325 (65 FE) MG PO TABS
325.0000 mg | ORAL_TABLET | Freq: Every day | ORAL | Status: DC
  Administered 2020-01-12 – 2020-01-14 (×3): 325 mg via ORAL
  Filled 2020-01-12 (×3): qty 1

## 2020-01-12 MED ORDER — SODIUM CHLORIDE 0.9 % IV BOLUS
500.0000 mL | Freq: Once | INTRAVENOUS | Status: AC
  Administered 2020-01-12: 11:00:00 500 mL via INTRAVENOUS

## 2020-01-12 NOTE — Progress Notes (Signed)
PROGRESS NOTE    Melissa Washington  [id number] DOB: 07-16-1986 DOA: 01/10/2020 PCP: [facility]   Brief Narrative: 34 year old with past medical history significant for diabetes type 2, hypertension, promyelocytic leukemia under remission presented to the ED complaining of left flank pain, vomiting and subjective fever.  Patient presented with leukocytosis, lactic acid normal admitted for pyelonephritis.   Assessment & Plan:   Principal Problem:   Pyelonephritis Active Problems:   DM (diabetes mellitus), type 2, uncontrolled ([place])   HYPERTENSION, BENIGN ESSENTIAL   Morbid obesity with BMI of 50.0-59.9, adult (HCC)  1-Sepsis, E. coli bacteremia, pyelonephritis; Patient presented with fever temperature 102, tachycardia, tachypnea respiration 34.  Source of infection likely pyelonephritis and E. coli bacteremia Continue with IV fluids.  IV antibiotics with ceftriaxone day 3 today.  Blood cultures growing E coli, await sensitivity  CT abdomen and pelvis unrevealing. Repeat Blood cultures 4/23 today.  WBC normalized, fever curve decreasing.   2-Nausea and vomiting related to pyelonephritis, continue with fluid support care Improved.   3-E coli bacteremia: Continue with IV fluids, IV antibiotics. She will need repeated blood cultures Repeat blood cultures.   4-History of promyelocytic leukemia in remission.  5-Diabetes: Continue with a sliding scale insulin and lantus.  6-Hypertension: Hold lisinopril 7-Anemia:  Iron and B 12 deficiency. Start supplement.    Estimated body mass index is 50.13 kg/m as calculated from the following:   Height as of this encounter: 5\' 2"  (1.575 m).   Weight as of this encounter: 124.3 kg.   DVT prophylaxis: Lovenox Code Status: Full code Family Communication: Care discussed with patient Disposition Plan:  Patient is from: Home Anticipated d/c date: Home in 2  days after resolution of bacteremia Barriers to  d/c or necessity for inpatient status: Remain in the hospital for IV antibiotics, patient found to have E. coli bacteremia. Repeat Blood cultures today to document resolution.   Consultants:   None  Procedures:   None  Antimicrobials:  Ceftriaxone  Subjective: She report improvement of flank pain. Feeling better , not back at baseline.  She haas been ambulating.   Objective: Vitals:   01/11/20 2024 01/11/20 2100 01/12/20 0026 01/12/20 0455  BP: 118/62  93/74 109/64  Pulse: 89  84 89  Resp: 18  18 18   Temp: 98.8 F (37.1 C)  100.3 F (37.9 C) 99 F (37.2 C)  TempSrc: Oral  Oral Oral  SpO2: 96%  100% 96%  Weight:  124.3 kg    Height:  5\' 2"  (1.575 m)      Intake/Output Summary (Last 24 hours) at 01/12/2020 0831 Last data filed at 01/12/2020 0540 Gross per 24 hour  Intake 2454.96 ml  Output 2600 ml  Net -145.04 ml   Filed Weights   01/11/20 2100  Weight: 124.3 kg    Examination:  General exam: NAD  Respiratory system: CTA Cardiovascular system: S 1. S 2 RRR Gastrointestinal system:BS present, soft, nt Central nervous system: Non focal.  Extremities: Symmetric power.     Data Reviewed: I have personally reviewed following labs and imaging studies  CBC: Recent Labs  Lab 01/10/20 1851 01/11/20 0033 01/11/20 0459 01/12/20 0326  WBC 11.8* 11.0* 12.5* 6.7  HGB 11.8* 10.7* 10.4* 9.0*  HCT 34.8* 32.7* 31.1* 27.5*  MCV 88.3 90.6 89.9 90.2  PLT 212 177 185 [id number]   Basic Metabolic Panel: Recent Labs  Lab 01/10/20 1851 01/11/20 0033 01/11/20 0459 01/12/20 0326  NA 137  --  136 138  K 3.7  --  3.7 3.7  CL 103  --  106 108  CO2 25  --  21* 21*  GLUCOSE 156*  --  180* 212*  BUN 11  --  8 10  CREATININE 0.71 0.58 0.71 0.69  CALCIUM 9.1  --  8.1* 8.5*   GFR: Estimated Creatinine Clearance: 128.4 mL/min (by C-G formula based on SCr of 0.69 mg/dL). Liver Function Tests: Recent Labs  Lab 01/10/20 1851  AST 26  ALT 30  ALKPHOS 65  BILITOT 1.5*   PROT 7.7  ALBUMIN 4.4   Recent Labs  Lab 01/10/20 1851  LIPASE 47   No results for input(s): AMMONIA in the last 168 hours. Coagulation Profile: Recent Labs  Lab 01/10/20 2057  INR 1.1   Cardiac Enzymes: No results for input(s): CKTOTAL, CKMB, CKMBINDEX, TROPONINI in the last 168 hours. BNP (last 3 results) No results for input(s): PROBNP in the last 8760 hours. HbA1C: No results for input(s): HGBA1C in the last 72 hours. CBG: Recent Labs  Lab 01/11/20 0750 01/11/20 1201 01/11/20 1626 01/11/20 2126 01/12/20 0751  GLUCAP 139* 156* 112* 171* 128*   Lipid Profile: No results for input(s): CHOL, HDL, LDLCALC, TRIG, CHOLHDL, LDLDIRECT in the last 72 hours. Thyroid Function Tests: No results for input(s): TSH, T4TOTAL, FREET4, T3FREE, THYROIDAB in the last 72 hours. Anemia Panel: Recent Labs    01/12/20 0326  VITAMINB12 183  FOLATE 6.9  FERRITIN 675*  TIBC 260  IRON 26*  RETICCTPCT 1.1   Sepsis Labs: Recent Labs  Lab 01/10/20 2057  LATICACIDVEN 1.5    Recent Results (from the past 240 hour(s))  Respiratory Panel by RT PCR (Flu A&B, Covid) - Nasopharyngeal Swab     Status: None   Collection Time: 01/10/20  8:21 PM   Specimen: Nasopharyngeal Swab  Result Value Ref Range Status   SARS Coronavirus 2 by RT PCR NEGATIVE NEGATIVE Final    Comment: (NOTE) SARS-CoV-2 target nucleic acids are NOT DETECTED. The SARS-CoV-2 RNA is generally detectable in upper respiratoy specimens during the acute phase of infection. The lowest concentration of SARS-CoV-2 viral copies this assay can detect is 131 copies/mL. A negative result does not preclude SARS-Cov-2 infection and should not be used as the sole basis for treatment or other patient management decisions. A negative result may occur with  improper specimen collection/handling, submission of specimen other than nasopharyngeal swab, presence of viral mutation(s) within the areas targeted by this assay, and inadequate  number of viral copies (<131 copies/mL). A negative result must be combined with clinical observations, patient history, and epidemiological information. The expected result is Negative. Fact Sheet for Patients:  PinkCheek.be Fact Sheet for Healthcare Providers:  GravelBags.it This test is not yet ap proved or cleared by the [place] FDA and  has been authorized for detection and/or diagnosis of SARS-CoV-2 by FDA under an Emergency Use Authorization (EUA). This EUA will remain  in effect (meaning this test can be used) for the duration of the COVID-19 declaration under Section 564(b)(1) of the Act, 21 U.S.C. section 360bbb-3(b)(1), unless the authorization is terminated or revoked sooner.    Influenza A by PCR NEGATIVE NEGATIVE Final   Influenza B by PCR NEGATIVE NEGATIVE Final    Comment: (NOTE) The Xpert Xpress SARS-CoV-2/FLU/RSV assay is intended as an aid in  the diagnosis of influenza from Nasopharyngeal swab specimens and  should not be used as a sole basis for treatment. Nasal washings and  aspirates are unacceptable for  Xpert Xpress SARS-CoV-2/FLU/RSV  testing. Fact Sheet for Patients: PinkCheek.be Fact Sheet for Healthcare Providers: GravelBags.it This test is not yet approved or cleared by the [place] FDA and  has been authorized for detection and/or diagnosis of SARS-CoV-2 by  FDA under an Emergency Use Authorization (EUA). This EUA will remain  in effect (meaning this test can be used) for the duration of the  Covid-19 declaration under Section 564(b)(1) of the Act, 21  U.S.C. section 360bbb-3(b)(1), unless the authorization is  terminated or revoked. Performed at [facility], [place] [address], [place], [place] [address]   Blood Culture (routine x 2)     Status: Abnormal (Preliminary result)   Collection Time: 01/10/20  8:57  PM   Specimen: BLOOD  Result Value Ref Range Status   Specimen Description   Final    BLOOD LEFT ANTECUBITAL Performed at [facility], [place] [address], [place], [place] [address]    Special Requests   Final    BOTTLES DRAWN AEROBIC AND ANAEROBIC Blood Culture adequate volume Performed at [place] [address] [place], [place] [address]    Culture  Setup Time   Final    GRAM NEGATIVE RODS IN BOTH AEROBIC AND ANAEROBIC BOTTLES CRITICAL RESULT CALLED TO, READ BACK BY AND VERIFIED WITH: N. [place], PHARMD ([place]) AT 1235 ON 01/11/20 BY C. JESSUP, MT.    Culture (A)  Final    ESCHERICHIA COLI SUSCEPTIBILITIES TO FOLLOW Performed at [facility], [place] [address] [place], [place] [address]    Report Status PENDING  Incomplete  Blood Culture (routine x 2)     Status: None (Preliminary result)   Collection Time: 01/10/20  8:57 PM   Specimen: BLOOD  Result Value Ref Range Status   Specimen Description   Final    BLOOD RIGHT ANTECUBITAL Performed at [facility], [place] [address] [place], [place] [address]    Special Requests   Final    BOTTLES DRAWN AEROBIC AND ANAEROBIC Blood Culture adequate volume Performed at [place] [address], [place], [place] [address]    Culture  Setup Time   Final    GRAM NEGATIVE RODS ANAEROBIC BOTTLE ONLY CRITICAL RESULT CALLED TO, READ BACK BY AND VERIFIED WITH: N. [place], PHARMD ([place]) AT [id number] ON 01/11/20 BY C. JESSUP, MT. Performed at [facility], [place] [address], [place], [place] [address]    Culture GRAM NEGATIVE RODS  Final   Report Status PENDING  Incomplete  Urine culture     Status: Abnormal   Collection Time: 01/10/20  8:57 PM   Specimen: In/Out Cath Urine  Result Value Ref Range Status   Specimen Description   Final    IN/OUT CATH URINE Performed at [facility], [place] [address], [place], [place] [address]    Special Requests   Final     NONE Performed at [facility], [place] [address] [place], [place] [address]    Culture MULTIPLE SPECIES PRESENT, SUGGEST RECOLLECTION (A)  Final   Report Status 01/12/2020 FINAL  Final  Blood Culture ID Panel (Reflexed)     Status: Abnormal   Collection Time: 01/10/20  8:57 PM  Result Value Ref Range Status   Enterococcus species NOT DETECTED NOT DETECTED Final   Listeria monocytogenes NOT DETECTED NOT DETECTED Final   Staphylococcus species NOT DETECTED NOT DETECTED Final   Staphylococcus aureus (BCID) NOT DETECTED NOT DETECTED Final  Streptococcus species NOT DETECTED NOT DETECTED Final   Streptococcus agalactiae NOT DETECTED NOT DETECTED Final   Streptococcus pneumoniae NOT DETECTED NOT DETECTED Final   Streptococcus pyogenes NOT DETECTED NOT DETECTED Final   Acinetobacter baumannii NOT DETECTED NOT DETECTED Final   Enterobacteriaceae species DETECTED (A) NOT DETECTED Final    Comment: Enterobacteriaceae represent a large family of gram-negative bacteria, not a single organism. CRITICAL RESULT CALLED TO, READ BACK BY AND VERIFIED WITH: N. [place], PHARMD ([place]) AT 1235 ON 01/11/20 BY C. JESSUP, MT.    Enterobacter cloacae complex NOT DETECTED NOT DETECTED Final   Escherichia coli DETECTED (A) NOT DETECTED Final    Comment: CRITICAL RESULT CALLED TO, READ BACK BY AND VERIFIED WITH: N. [place], PHARMD ([place]) AT 1235 ON 01/11/20 BY C. JESSUP, MT.    Klebsiella oxytoca NOT DETECTED NOT DETECTED Final   Klebsiella pneumoniae NOT DETECTED NOT DETECTED Final   Proteus species NOT DETECTED NOT DETECTED Final   Serratia marcescens NOT DETECTED NOT DETECTED Final   Carbapenem resistance NOT DETECTED NOT DETECTED Final   Haemophilus influenzae NOT DETECTED NOT DETECTED Final   Neisseria meningitidis NOT DETECTED NOT DETECTED Final   Pseudomonas aeruginosa NOT DETECTED NOT DETECTED Final   Candida albicans NOT DETECTED NOT DETECTED Final   Candida glabrata NOT DETECTED NOT  DETECTED Final   Candida krusei NOT DETECTED NOT DETECTED Final   Candida parapsilosis NOT DETECTED NOT DETECTED Final   Candida tropicalis NOT DETECTED NOT DETECTED Final    Comment: Performed at [facility], [place] [address] [place], [place] [address]         Radiology Studies: CT ABDOMEN PELVIS W CONTRAST  Result Date: 01/10/2020 CLINICAL DATA:  Left-sided flank pain since yesterday, history of leukemia EXAM: CT ABDOMEN AND PELVIS WITH CONTRAST TECHNIQUE: Multidetector CT imaging of the abdomen and pelvis was performed using the standard protocol following bolus administration of intravenous contrast. Imaging was delayed as patient vomited after contrast administration. CONTRAST:  137mL OMNIPAQUE IOHEXOL 300 MG/ML  SOLN COMPARISON:  09/17/2017 FINDINGS: Lower chest: No acute pleural or parenchymal lung disease. Hepatobiliary: No focal liver abnormality is seen. Status post cholecystectomy. No biliary dilatation. Pancreas: Unremarkable. No pancreatic ductal dilatation or surrounding inflammatory changes. Spleen: Normal in size without focal abnormality. Adrenals/Urinary Tract: Because of the delayed nature of the exam, contrast is already excreted by the kidneys at the time of the study. There is no high-grade obstruction. Small nonobstructing calculi could be obscured by the excreted contrast. The adrenals are unremarkable. Bladder is normal. Stomach/Bowel: Normal appendix right lower quadrant. No bowel obstruction or ileus. No bowel wall thickening or inflammatory change. Vascular/Lymphatic: No significant vascular findings are present. No enlarged abdominal or pelvic lymph nodes. Reproductive: IUD within the uterus. No adnexal masses. Other: No abdominal wall hernia or abnormality. No abdominopelvic ascites. Musculoskeletal: No acute or destructive bony lesions. Reconstructed images demonstrate no additional findings. IMPRESSION: 1. No acute intra-abdominal or intrapelvic process. 2. Exam  was delayed due to patient vomiting after contrast administration. Small nonobstructing urinary tract calculi could be obscured by the excreted contrast. Electronically Signed   By: Randa Ngo M.D.   On: 01/10/2020 22:32   DG Chest Port 1 View  Result Date: 01/10/2020 CLINICAL DATA:  Fever.  Hypertension. EXAM: PORTABLE CHEST 1 VIEW COMPARISON:  04/07/2017 FINDINGS: Midline trachea. Normal heart size for level of inspiration. Breast tissue overlies the lower lungs. No pleural effusion or pneumothorax. Clear lungs. IMPRESSION: No acute cardiopulmonary disease. Electronically Signed  By: Abigail Miyamoto M.D.   On: 01/10/2020 20:36        Scheduled Meds: . enoxaparin (LOVENOX) injection  60 mg Subcutaneous QHS  . ferrous sulfate  325 mg Oral Q breakfast  . insulin aspart  0-9 Units Subcutaneous TID WC  . insulin glargine  32 Units Subcutaneous Q2200  . sodium chloride flush  3 mL Intravenous Once  . vitamin B-12  100 mcg Oral Daily   Continuous Infusions: . sodium chloride 100 mL/hr at 01/12/20 0023  . cefTRIAXone (ROCEPHIN)  IV 2 g (01/11/20 2016)  . sodium chloride       LOS: 1 day    Time spent: 35 minutes    Verble Styron A Maurisa Tesmer, MD [facility]   If 7PM-7AM, please contact night-coverage [email or web address]  01/12/2020, 8:31 AM

## 2020-01-13 LAB — BASIC METABOLIC PANEL
Anion gap: 9 (ref 5–15)
BUN: 12 mg/dL (ref 6–20)
CO2: 24 mmol/L (ref 22–32)
Calcium: 9 mg/dL (ref 8.9–10.3)
Chloride: 106 mmol/L (ref 98–111)
Creatinine, Ser: 0.69 mg/dL (ref 0.44–1.00)
GFR calc Af Amer: >60 mL/min (ref >60)
GFR calc non Af Amer: >60 mL/min (ref >60)
Glucose, Bld: 145 mg/dL — ABNORMAL HIGH (ref 70–99)
Potassium: 3.6 mmol/L (ref 3.5–5.1)
Sodium: 139 mmol/L (ref 135–145)

## 2020-01-13 LAB — CULTURE, BLOOD (ROUTINE X 2)
Special Requests: ADEQUATE
Special Requests: ADEQUATE

## 2020-01-13 LAB — CBC
HCT: 29.4 % — ABNORMAL LOW (ref 36.0–46.0)
Hemoglobin: 10.1 g/dL — ABNORMAL LOW (ref 12.0–15.0)
MCH: 30.3 pg (ref 26.0–34.0)
MCHC: 34.4 g/dL (ref 30.0–36.0)
MCV: 88.3 fL (ref 80.0–100.0)
Platelets: 212 10*3/uL (ref 150–400)
RBC: 3.33 MIL/uL — ABNORMAL LOW (ref 3.87–5.11)
RDW: 12.3 % (ref 11.5–15.5)
WBC: 7 10*3/uL (ref 4.0–10.5)
nRBC: 0 % (ref 0.0–0.2)

## 2020-01-13 LAB — GLUCOSE, CAPILLARY
Glucose-Capillary: 113 mg/dL — ABNORMAL HIGH (ref 70–99)
Glucose-Capillary: 139 mg/dL — ABNORMAL HIGH (ref 70–99)
Glucose-Capillary: 146 mg/dL — ABNORMAL HIGH (ref 70–99)
Glucose-Capillary: 167 mg/dL — ABNORMAL HIGH (ref 70–99)

## 2020-01-13 NOTE — Plan of Care (Signed)
Patient medicated for pain x 1 on 7 a to 7 p shift with improvement.  Afebrile.

## 2020-01-13 NOTE — Progress Notes (Signed)
PROGRESS NOTE    Melissa Washington  [id number] DOB: 1986/02/15 DOA: 01/10/2020 PCP: [facility]   Brief Narrative: 34 year old with past medical history significant for diabetes type 2, hypertension, promyelocytic leukemia under remission presented to the ED complaining of left flank pain, vomiting and subjective fever.  Patient presented with leukocytosis, lactic acid normal admitted for pyelonephritis.    Assessment & Plan:   Principal Problem:   Pyelonephritis Active Problems:   DM (diabetes mellitus), type 2, uncontrolled ([place])   HYPERTENSION, BENIGN ESSENTIAL   Morbid obesity with BMI of 50.0-59.9, adult (HCC)  1-Sepsis, E. coli bacteremia, pyelonephritis; Patient presented with fever temperature 102, tachycardia, tachypnea respiration 34.  Source of infection likely pyelonephritis and E. coli bacteremia Continue with IV fluids.  IV antibiotics with ceftriaxone day 4. Blood cultures growing E coli, await sensitivity  CT abdomen and pelvis unrevealing. Repeat Blood cultures 4/23 no growth to date WBC normalized, fever curve decreasing.   2-Nausea and vomiting related to pyelonephritis, continue with fluid support care resolved  3-E coli bacteremia: Continue with IV fluids, IV antibiotics. She will need repeated blood cultures Repeat blood cultures. No growth to date.   4-History of promyelocytic leukemia in remission.  5-Diabetes: Continue with a sliding scale insulin and lantus.  6-Hypertension: Hold lisinopril 7-Anemia:  Iron and B 12 deficiency. Start supplement.    Estimated body mass index is 50.13 kg/m as calculated from the following:   Height as of this encounter: 5\' 2"  (1.575 m).   Weight as of this encounter: 124.3 kg.   DVT prophylaxis: Lovenox Code Status: Full code Family Communication: Care discussed with patient Disposition Plan:  Patient is from: Home Anticipated d/c date: Home in 1  Day.  Barriers to d/c or  necessity for inpatient status: Remain in the hospital for IV antibiotics, patient found to have E. coli bacteremia. Mild fever last night.  Consultants:   None  Procedures:   None  Antimicrobials:  Ceftriaxone  Subjective: She is breathing better.  Pain improving.    Objective: Vitals:   01/12/20 1622 01/12/20 2043 01/13/20 0622 01/13/20 1330  BP:  (!) 138/93 127/75 133/88  Pulse:  80 81 80  Resp:  18 18 16   Temp: 98.7 F (37.1 C) 98.3 F (36.8 C) 99 F (37.2 C) 98.4 F (36.9 C)  TempSrc: Oral Oral Oral Oral  SpO2:  100% 97% 97%  Weight:      Height:        Intake/Output Summary (Last 24 hours) at 01/13/2020 1423 Last data filed at 01/13/2020 1253 Gross per 24 hour  Intake 1493.76 ml  Output 1500 ml  Net -6.24 ml   Filed Weights   01/11/20 2100  Weight: 124.3 kg    Examination:  General exam: NAD Respiratory system: CTA Cardiovascular system: S 1, S 2 RRR Gastrointestinal system: BS present, soft, nt Central nervous system: Non focal.   Extremities: Symmetric power    Data Reviewed: I have personally reviewed following labs and imaging studies  CBC: Recent Labs  Lab 01/10/20 1851 01/11/20 0033 01/11/20 0459 01/12/20 0326  WBC 11.8* 11.0* 12.5* 6.7  HGB 11.8* 10.7* 10.4* 9.0*  HCT 34.8* 32.7* 31.1* 27.5*  MCV 88.3 90.6 89.9 90.2  PLT 212 177 185 [id number]   Basic Metabolic Panel: Recent Labs  Lab 01/10/20 1851 01/11/20 0033 01/11/20 0459 01/12/20 0326  NA 137  --  136 138  K 3.7  --  3.7 3.7  CL 103  --  106 108  CO2 25  --  21* 21*  GLUCOSE 156*  --  180* 212*  BUN 11  --  8 10  CREATININE 0.71 0.58 0.71 0.69  CALCIUM 9.1  --  8.1* 8.5*   GFR: Estimated Creatinine Clearance: 128.4 mL/min (by C-G formula based on SCr of 0.69 mg/dL). Liver Function Tests: Recent Labs  Lab 01/10/20 1851  AST 26  ALT 30  ALKPHOS 65  BILITOT 1.5*  PROT 7.7  ALBUMIN 4.4   Recent Labs  Lab 01/10/20 1851  LIPASE 47   No results for input(s):  AMMONIA in the last 168 hours. Coagulation Profile: Recent Labs  Lab 01/10/20 2057  INR 1.1   Cardiac Enzymes: No results for input(s): CKTOTAL, CKMB, CKMBINDEX, TROPONINI in the last 168 hours. BNP (last 3 results) No results for input(s): PROBNP in the last 8760 hours. HbA1C: No results for input(s): HGBA1C in the last 72 hours. CBG: Recent Labs  Lab 01/12/20 1139 01/12/20 1652 01/12/20 2129 01/13/20 0747 01/13/20 1133  GLUCAP 165* 146* 158* 113* 139*   Lipid Profile: No results for input(s): CHOL, HDL, LDLCALC, TRIG, CHOLHDL, LDLDIRECT in the last 72 hours. Thyroid Function Tests: No results for input(s): TSH, T4TOTAL, FREET4, T3FREE, THYROIDAB in the last 72 hours. Anemia Panel: Recent Labs    01/12/20 0326  VITAMINB12 183  FOLATE 6.9  FERRITIN 675*  TIBC 260  IRON 26*  RETICCTPCT 1.1   Sepsis Labs: Recent Labs  Lab 01/10/20 2057  LATICACIDVEN 1.5    Recent Results (from the past 240 hour(s))  Respiratory Panel by RT PCR (Flu A&B, Covid) - Nasopharyngeal Swab     Status: None   Collection Time: 01/10/20  8:21 PM   Specimen: Nasopharyngeal Swab  Result Value Ref Range Status   SARS Coronavirus 2 by RT PCR NEGATIVE NEGATIVE Final    Comment: (NOTE) SARS-CoV-2 target nucleic acids are NOT DETECTED. The SARS-CoV-2 RNA is generally detectable in upper respiratoy specimens during the acute phase of infection. The lowest concentration of SARS-CoV-2 viral copies this assay can detect is 131 copies/mL. A negative result does not preclude SARS-Cov-2 infection and should not be used as the sole basis for treatment or other patient management decisions. A negative result may occur with  improper specimen collection/handling, submission of specimen other than nasopharyngeal swab, presence of viral mutation(s) within the areas targeted by this assay, and inadequate number of viral copies (<131 copies/mL). A negative result must be combined with  clinical observations, patient history, and epidemiological information. The expected result is Negative. Fact Sheet for Patients:  PinkCheek.be Fact Sheet for Healthcare Providers:  GravelBags.it This test is not yet ap proved or cleared by the [place] FDA and  has been authorized for detection and/or diagnosis of SARS-CoV-2 by FDA under an Emergency Use Authorization (EUA). This EUA will remain  in effect (meaning this test can be used) for the duration of the COVID-19 declaration under Section 564(b)(1) of the Act, 21 U.S.C. section 360bbb-3(b)(1), unless the authorization is terminated or revoked sooner.    Influenza A by PCR NEGATIVE NEGATIVE Final   Influenza B by PCR NEGATIVE NEGATIVE Final    Comment: (NOTE) The Xpert Xpress SARS-CoV-2/FLU/RSV assay is intended as an aid in  the diagnosis of influenza from Nasopharyngeal swab specimens and  should not be used as a sole basis for treatment. Nasal washings and  aspirates are unacceptable for Xpert Xpress SARS-CoV-2/FLU/RSV  testing. Fact Sheet for Patients: PinkCheek.be Fact Sheet for  Healthcare Providers: GravelBags.it This test is not yet approved or cleared by the [place] and  has been authorized for detection and/or diagnosis of SARS-CoV-2 by  FDA under an Emergency Use Authorization (EUA). This EUA will remain  in effect (meaning this test can be used) for the duration of the  Covid-19 declaration under Section 564(b)(1) of the Act, 21  U.S.C. section 360bbb-3(b)(1), unless the authorization is  terminated or revoked. Performed at [facility], [place] [address] [place], [place] [address]   Blood Culture (routine x 2)     Status: Abnormal   Collection Time: 01/10/20  8:57 PM   Specimen: BLOOD  Result Value Ref Range Status   Specimen Description   Final    BLOOD LEFT  ANTECUBITAL Performed at [facility], [place] [address] [place], [place] [address]    Special Requests   Final    BOTTLES DRAWN AEROBIC AND ANAEROBIC Blood Culture adequate volume Performed at [facility] [address] [place], [place] [address]    Culture  Setup Time   Final    GRAM NEGATIVE RODS IN BOTH AEROBIC AND ANAEROBIC BOTTLES CRITICAL RESULT CALLED TO, READ BACK BY AND VERIFIED WITH: N. [place], PHARMD (WL) AT K2006000 ON 01/11/20 BY C. JESSUP, MT. Performed at [facility], [place] [address] [place], [place] [address]    Culture ESCHERICHIA COLI (A)  Final   Report Status 01/13/2020 FINAL  Final   Organism ID, Bacteria ESCHERICHIA COLI  Final      Susceptibility   Escherichia coli - MIC*    AMPICILLIN <=2 SENSITIVE Sensitive     CEFAZOLIN <=4 SENSITIVE Sensitive     CEFEPIME <=1 SENSITIVE Sensitive     CEFTAZIDIME <=1 SENSITIVE Sensitive     CEFTRIAXONE <=1 SENSITIVE Sensitive     CIPROFLOXACIN <=0.25 SENSITIVE Sensitive     GENTAMICIN <=1 SENSITIVE Sensitive     IMIPENEM <=0.25 SENSITIVE Sensitive     TRIMETH/SULFA >=320 RESISTANT Resistant     AMPICILLIN/SULBACTAM <=2 SENSITIVE Sensitive     PIP/TAZO <=4 SENSITIVE Sensitive     * ESCHERICHIA COLI  Blood Culture (routine x 2)     Status: Abnormal   Collection Time: 01/10/20  8:57 PM   Specimen: BLOOD  Result Value Ref Range Status   Specimen Description   Final    BLOOD RIGHT ANTECUBITAL Performed at [facility], [place] [address], [place], [place] [address]    Special Requests   Final    BOTTLES DRAWN AEROBIC AND ANAEROBIC Blood Culture adequate volume Performed at [facility] [address], [place], [place] [address]    Culture  Setup Time   Final    GRAM NEGATIVE RODS ANAEROBIC BOTTLE ONLY CRITICAL RESULT CALLED TO, READ BACK BY AND VERIFIED WITH: N. [place], PHARMD ([place]) AT [id number] ON 01/11/20 BY C. JESSUP, MT.    Culture (A)  Final    ESCHERICHIA  COLI SUSCEPTIBILITIES PERFORMED ON PREVIOUS CULTURE WITHIN THE LAST 5 DAYS. Performed at [facility], [place] [address], [place], [place] [address]    Report Status 01/13/2020 FINAL  Final  Urine culture     Status: Abnormal   Collection Time: 01/10/20  8:57 PM   Specimen: In/Out Cath Urine  Result Value Ref Range Status   Specimen Description   Final    IN/OUT CATH URINE Performed at [facility] [place] [address] [place], [place] [address]  Special Requests   Final    NONE Performed at [facility], [place] [address], [place], [place] [address]    Culture MULTIPLE SPECIES PRESENT, SUGGEST RECOLLECTION (A)  Final   Report Status 01/12/2020 FINAL  Final  Blood Culture ID Panel (Reflexed)     Status: Abnormal   Collection Time: 01/10/20  8:57 PM  Result Value Ref Range Status   Enterococcus species NOT DETECTED NOT DETECTED Final   Listeria monocytogenes NOT DETECTED NOT DETECTED Final   Staphylococcus species NOT DETECTED NOT DETECTED Final   Staphylococcus aureus (BCID) NOT DETECTED NOT DETECTED Final   Streptococcus species NOT DETECTED NOT DETECTED Final   Streptococcus agalactiae NOT DETECTED NOT DETECTED Final   Streptococcus pneumoniae NOT DETECTED NOT DETECTED Final   Streptococcus pyogenes NOT DETECTED NOT DETECTED Final   Acinetobacter baumannii NOT DETECTED NOT DETECTED Final   Enterobacteriaceae species DETECTED (A) NOT DETECTED Final    Comment: Enterobacteriaceae represent a large family of gram-negative bacteria, not a single organism. CRITICAL RESULT CALLED TO, READ BACK BY AND VERIFIED WITH: N. [place], PHARMD (WL) AT 1235 ON 01/11/20 BY C. JESSUP, MT.    Enterobacter cloacae complex NOT DETECTED NOT DETECTED Final   Escherichia coli DETECTED (A) NOT DETECTED Final    Comment: CRITICAL RESULT CALLED TO, READ BACK BY AND VERIFIED WITH: N. [place], PHARMD ([place]) AT 1235 ON 01/11/20 BY C. JESSUP, MT.    Klebsiella oxytoca NOT  DETECTED NOT DETECTED Final   Klebsiella pneumoniae NOT DETECTED NOT DETECTED Final   Proteus species NOT DETECTED NOT DETECTED Final   Serratia marcescens NOT DETECTED NOT DETECTED Final   Carbapenem resistance NOT DETECTED NOT DETECTED Final   Haemophilus influenzae NOT DETECTED NOT DETECTED Final   Neisseria meningitidis NOT DETECTED NOT DETECTED Final   Pseudomonas aeruginosa NOT DETECTED NOT DETECTED Final   Candida albicans NOT DETECTED NOT DETECTED Final   Candida glabrata NOT DETECTED NOT DETECTED Final   Candida krusei NOT DETECTED NOT DETECTED Final   Candida parapsilosis NOT DETECTED NOT DETECTED Final   Candida tropicalis NOT DETECTED NOT DETECTED Final    Comment: Performed at [facility], [place] [address], [place], [place] [address]  Culture, blood (routine x 2)     Status: None (Preliminary result)   Collection Time: 01/12/20  9:23 AM   Specimen: BLOOD RIGHT HAND  Result Value Ref Range Status   Specimen Description   Final    BLOOD RIGHT HAND Performed at [facility] [address], [place], [place] [address]    Special Requests   Final    BOTTLES DRAWN AEROBIC AND ANAEROBIC Blood Culture adequate volume Performed at [facility] [address], [place], [place] [address]    Culture   Final    NO GROWTH 1 DAY Performed at [facility], [place] [address], [place], [place] [address]    Report Status PENDING  Incomplete  Culture, blood (routine x 2)     Status: None (Preliminary result)   Collection Time: 01/12/20  9:35 AM   Specimen: BLOOD  Result Value Ref Range Status   Specimen Description   Final    BLOOD LEFT ARM Performed at [facility] [address], [place], [place] [address]    Special Requests   Final    BOTTLES DRAWN AEROBIC ONLY Blood Culture adequate volume Performed at [facility] [address] [place] [address] [place], [place] [address]  Culture   Final    NO GROWTH  1 DAY Performed at [facility], [place] [address] [place], [place] [address]    Report Status PENDING  Incomplete         Radiology Studies: No results found.      Scheduled Meds: . enoxaparin (LOVENOX) injection  60 mg Subcutaneous QHS  . ferrous sulfate  325 mg Oral Q breakfast  . insulin aspart  0-9 Units Subcutaneous TID WC  . insulin glargine  32 Units Subcutaneous Q2200  . sodium chloride flush  3 mL Intravenous Once  . vitamin B-12  100 mcg Oral Daily   Continuous Infusions: . cefTRIAXone (ROCEPHIN)  IV 2 g (01/12/20 2131)     LOS: 2 days    Time spent: 35 minutes    Florestine Carmical A Burkley Dech, MD [facility]   If 7PM-7AM, please contact night-coverage [email or web address]  01/13/2020, 2:23 PM

## 2020-01-14 LAB — GLUCOSE, CAPILLARY
Glucose-Capillary: 102 mg/dL — ABNORMAL HIGH (ref 70–99)
Glucose-Capillary: 141 mg/dL — ABNORMAL HIGH (ref 70–99)

## 2020-01-14 MED ORDER — CEPHALEXIN 500 MG PO CAPS: 500.0000 mg | ORAL_CAPSULE | Freq: Three times a day (TID) | ORAL | 0 refills | Status: AC

## 2020-01-14 MED ORDER — SODIUM CHLORIDE 0.9 % IV SOLN
2.0000 g | INTRAVENOUS | Status: DC
  Administered 2020-01-14: 11:00:00 2 g via INTRAVENOUS
  Filled 2020-01-14: qty 2

## 2020-01-14 MED ORDER — CYANOCOBALAMIN 100 MCG PO TABS: 100.0000 ug | ORAL_TABLET | Freq: Every day | ORAL | 0 refills | Status: DC

## 2020-01-14 MED ORDER — FERROUS SULFATE 325 (65 FE) MG PO TABS: 325.0000 mg | ORAL_TABLET | Freq: Every day | ORAL | 1 refills | Status: DC

## 2020-01-14 MED ORDER — HYDROCODONE-ACETAMINOPHEN 10-325 MG PO TABS: 1.0000 | ORAL_TABLET | Freq: Four times a day (QID) | ORAL | 0 refills | Status: DC | PRN

## 2020-01-14 NOTE — Discharge Instructions (Signed)
Do not drive or operate heavy machine while taking hydrocodone (pain medication )

## 2020-01-14 NOTE — Discharge Summary (Signed)
Physician Discharge Summary  Melissa Washington:[id number] DOB: [birth year] DOA: 01/10/2020  PCP: [facility], [place] date: 01/10/2020 Discharge date: 01/14/2020  Admitted From: Home  Disposition:  Home   Recommendations for Outpatient Follow-up:  1. Follow up with PCP in 1-2 weeks 2. Please obtain BMP/CBC in one week 3. Please follow up on the following pending results: Final report of repeated Blood cultures.  4. Check B 12 and iron level;  Home Health: None  Discharge Condition: Stable.  CODE STATUS: Full code Diet recommendation: Heart Healthy  Brief/Interim Summary: 34 year old with past medical history significant for diabetes type 2, hypertension, promyelocytic leukemia under remission presented to the ED complaining of left flank pain, vomiting and subjective fever.  Patient presented with leukocytosis, lactic acid normal admitted for pyelonephritis.   1-Sepsis, E. coli bacteremia, pyelonephritis; Patient presented with fever temperature 102, tachycardia, tachypnea respiration 34.  Source of infection likely pyelonephritis and E. coli bacteremia Continue with IV fluids. Received ceftriaxone for 4 days. Blood cultures growing E coli, await sensitivity  CT abdomen and pelvis unrevealing. Repeat Blood cultures 4/23 no growth to date WBC normalized, afebrile.  Plan to discharge home on Keflex for total 10 days of antibiotics.   2-Nausea and vomiting related to pyelonephritis, continue with fluid support care resolved  3-E coli bacteremia: Treated with IV fluids, IV antibiotics. She will need repeated blood cultures Repeat blood cultures. No growth to date.   4-History of promyelocytic leukemia in remission. Follow up with PCP  5-Diabetes: Continue with a sliding scale insulin and lantus.  6-Hypertension: Hold lisinopril at discharge . SBP soft 7-Anemia:  Iron and B 12 deficiency. Started  supplement.     Discharge Diagnoses:   Principal Problem:   Pyelonephritis Active Problems:   DM (diabetes mellitus), type 2, uncontrolled ([place])   HYPERTENSION, BENIGN ESSENTIAL   Morbid obesity with BMI of 50.0-59.9, adult [facility])    Discharge Instructions  Discharge Instructions    Diet - low sodium heart healthy   Complete by: As directed    Increase activity slowly   Complete by: As directed      Allergies as of 01/14/2020      Reactions   Ultram [tramadol Hcl] Nausea And Vomiting      Medication List    STOP taking these medications   cetirizine 10 MG tablet Commonly known as: ZYRTEC   diclofenac sodium 1 % Gel Commonly known as: Voltaren   doxycycline 100 MG capsule Commonly known as: VIBRAMYCIN   lisinopril 10 MG tablet Commonly known as: ZESTRIL     TAKE these medications   blood glucose meter kit and supplies Dispense based on patient and insurance preference. Use up to four times daily as directed. (FOR ICD-9 250.00, 250.01).   cephALEXin 500 MG capsule Commonly known as: KEFLEX Take 1 capsule (500 mg total) by mouth 3 (three) times daily for 10 days.   cyanocobalamin 100 MCG tablet Take 1 tablet (100 mcg total) by mouth daily.   ferrous sulfate 325 (65 FE) MG tablet Take 1 tablet (325 mg total) by mouth daily with breakfast. Start taking on: January 15, 2020   HYDROcodone-acetaminophen 10-325 MG tablet Commonly known as: NORCO Take 1 tablet by mouth every 6 (six) hours as needed.   insulin glargine 100 UNIT/ML Solostar Pen Commonly known as: LANTUS Inject 30 Units into the skin daily at 10 pm. What changed: how much to take   Insulin Pen Needle 31G X 5 MM Misc  Use with insulin, 4 times daily   levonorgestrel 20 MCG/24HR IUD Commonly known as: MIRENA 1 each by Intrauterine route continuous.   metFORMIN 500 MG 24 hr tablet Commonly known as: GLUCOPHAGE-XR Take 500 mg by mouth 2 (two) times daily.   NovoLOG FlexPen 100 UNIT/ML FlexPen Generic drug: insulin aspart Inject 3-20  Units into the skin in the morning, at noon, and at bedtime.       Allergies  Allergen Reactions  . Ultram [Tramadol Hcl] Nausea And Vomiting    Consultations:  None   Procedures/Studies: CT ABDOMEN PELVIS W CONTRAST  Result Date: 01/10/2020 CLINICAL DATA:  Left-sided flank pain since yesterday, history of leukemia EXAM: CT ABDOMEN AND PELVIS WITH CONTRAST TECHNIQUE: Multidetector CT imaging of the abdomen and pelvis was performed using the standard protocol following bolus administration of intravenous contrast. Imaging was delayed as patient vomited after contrast administration. CONTRAST:  183m OMNIPAQUE IOHEXOL 300 MG/ML  SOLN COMPARISON:  09/17/2017 FINDINGS: Lower chest: No acute pleural or parenchymal lung disease. Hepatobiliary: No focal liver abnormality is seen. Status post cholecystectomy. No biliary dilatation. Pancreas: Unremarkable. No pancreatic ductal dilatation or surrounding inflammatory changes. Spleen: Normal in size without focal abnormality. Adrenals/Urinary Tract: Because of the delayed nature of the exam, contrast is already excreted by the kidneys at the time of the study. There is no high-grade obstruction. Small nonobstructing calculi could be obscured by the excreted contrast. The adrenals are unremarkable. Bladder is normal. Stomach/Bowel: Normal appendix right lower quadrant. No bowel obstruction or ileus. No bowel wall thickening or inflammatory change. Vascular/Lymphatic: No significant vascular findings are present. No enlarged abdominal or pelvic lymph nodes. Reproductive: IUD within the uterus. No adnexal masses. Other: No abdominal wall hernia or abnormality. No abdominopelvic ascites. Musculoskeletal: No acute or destructive bony lesions. Reconstructed images demonstrate no additional findings. IMPRESSION: 1. No acute intra-abdominal or intrapelvic process. 2. Exam was delayed due to patient vomiting after contrast administration. Small nonobstructing urinary  tract calculi could be obscured by the excreted contrast. Electronically Signed   By: MRanda NgoM.D.   On: 01/10/2020 22:32   DG Chest Port 1 View  Result Date: 01/10/2020 CLINICAL DATA:  Fever.  Hypertension. EXAM: PORTABLE CHEST 1 VIEW COMPARISON:  04/07/2017 FINDINGS: Midline trachea. Normal heart size for level of inspiration. Breast tissue overlies the lower lungs. No pleural effusion or pneumothorax. Clear lungs. IMPRESSION: No acute cardiopulmonary disease. Electronically Signed   By: KAbigail MiyamotoM.D.   On: 01/10/2020 20:36      Subjective: She is feeling better. Pain has improved  Discharge Exam: Vitals:   01/13/20 2024 01/14/20 0538  BP: 126/74 107/74  Pulse: 78 75  Resp: 17 18  Temp: 98.4 F (36.9 C) 98.4 F (36.9 C)  SpO2: 98% 97%     General: Pt is alert, awake, not in acute distress Cardiovascular: RRR, S1/S2 +, no rubs, no gallops Respiratory: CTA bilaterally, no wheezing, no rhonchi Abdominal: Soft, NT, ND, bowel sounds + Extremities: no edema, no cyanosis    The results of significant diagnostics from this hospitalization (including imaging, microbiology, ancillary and laboratory) are listed below for reference.     Microbiology: Recent Results (from the past 240 hour(s))  Respiratory Panel by RT PCR (Flu A&B, Covid) - Nasopharyngeal Swab     Status: None   Collection Time: 01/10/20  8:21 PM   Specimen: Nasopharyngeal Swab  Result Value Ref Range Status   SARS Coronavirus 2 by RT PCR NEGATIVE NEGATIVE Final  Comment: (NOTE) SARS-CoV-2 target nucleic acids are NOT DETECTED. The SARS-CoV-2 RNA is generally detectable in upper respiratoy specimens during the acute phase of infection. The lowest concentration of SARS-CoV-2 viral copies this assay can detect is 131 copies/mL. A negative result does not preclude SARS-Cov-2 infection and should not be used as the sole basis for treatment or other patient management decisions. A negative result may  occur with  improper specimen collection/handling, submission of specimen other than nasopharyngeal swab, presence of viral mutation(s) within the areas targeted by this assay, and inadequate number of viral copies (<131 copies/mL). A negative result must be combined with clinical observations, patient history, and epidemiological information. The expected result is Negative. Fact Sheet for Patients:  PinkCheek.be Fact Sheet for Healthcare Providers:  GravelBags.it This test is not yet ap proved or cleared by the [place] FDA and  has been authorized for detection and/or diagnosis of SARS-CoV-2 by FDA under an Emergency Use Authorization (EUA). This EUA will remain  in effect (meaning this test can be used) for the duration of the COVID-19 declaration under Section 564(b)(1) of the Act, 21 U.S.C. section 360bbb-3(b)(1), unless the authorization is terminated or revoked sooner.    Influenza A by PCR NEGATIVE NEGATIVE Final   Influenza B by PCR NEGATIVE NEGATIVE Final    Comment: (NOTE) The Xpert Xpress SARS-CoV-2/FLU/RSV assay is intended as an aid in  the diagnosis of influenza from Nasopharyngeal swab specimens and  should not be used as a sole basis for treatment. Nasal washings and  aspirates are unacceptable for Xpert Xpress SARS-CoV-2/FLU/RSV  testing. Fact Sheet for Patients: PinkCheek.be Fact Sheet for Healthcare Providers: GravelBags.it This test is not yet approved or cleared by the [place] FDA and  has been authorized for detection and/or diagnosis of SARS-CoV-2 by  FDA under an Emergency Use Authorization (EUA). This EUA will remain  in effect (meaning this test can be used) for the duration of the  Covid-19 declaration under Section 564(b)(1) of the Act, 21  U.S.C. section 360bbb-3(b)(1), unless the authorization is  terminated or  revoked. Performed at [facility], [place] [address], [place], [place] [address]   Blood Culture (routine x 2)     Status: Abnormal   Collection Time: 01/10/20  8:57 PM   Specimen: BLOOD  Result Value Ref Range Status   Specimen Description   Final    BLOOD LEFT ANTECUBITAL Performed at [facility], [place] [address] [place], [place] [address]    Special Requests   Final    BOTTLES DRAWN AEROBIC AND ANAEROBIC Blood Culture adequate volume Performed at [place] [address], [place], [place] [address]    Culture  Setup Time   Final    GRAM NEGATIVE RODS IN BOTH AEROBIC AND ANAEROBIC BOTTLES CRITICAL RESULT CALLED TO, READ BACK BY AND VERIFIED WITH: N. [place], PHARMD (WL) AT 3474 ON 01/11/20 BY C. JESSUP, MT. Performed at [facility], [place] [address], [place], [place] [address]    Culture ESCHERICHIA COLI (A)  Final   Report Status 01/13/2020 FINAL  Final   Organism ID, Bacteria ESCHERICHIA COLI  Final      Susceptibility   Escherichia coli - MIC*    AMPICILLIN <=2 SENSITIVE Sensitive     CEFAZOLIN <=4 SENSITIVE Sensitive     CEFEPIME <=1 SENSITIVE Sensitive     CEFTAZIDIME <=1 SENSITIVE Sensitive     CEFTRIAXONE <=1 SENSITIVE Sensitive     CIPROFLOXACIN <=0.25  SENSITIVE Sensitive     GENTAMICIN <=1 SENSITIVE Sensitive     IMIPENEM <=0.25 SENSITIVE Sensitive     TRIMETH/SULFA >=320 RESISTANT Resistant     AMPICILLIN/SULBACTAM <=2 SENSITIVE Sensitive     PIP/TAZO <=4 SENSITIVE Sensitive     * ESCHERICHIA COLI  Blood Culture (routine x 2)     Status: Abnormal   Collection Time: 01/10/20  8:57 PM   Specimen: BLOOD  Result Value Ref Range Status   Specimen Description   Final    BLOOD RIGHT ANTECUBITAL Performed at [facility], [place] [address] [place], [place] [address]    Special Requests   Final    BOTTLES DRAWN AEROBIC AND ANAEROBIC Blood Culture adequate volume Performed at [place] [address], [place], [place] [address]    Culture  Setup Time   Final    GRAM NEGATIVE RODS ANAEROBIC BOTTLE ONLY CRITICAL RESULT CALLED TO, READ BACK BY AND VERIFIED WITH: N. [place], PHARMD (WL) AT 0981 ON 01/11/20 BY C. JESSUP, MT.    Culture (A)  Final    ESCHERICHIA COLI SUSCEPTIBILITIES PERFORMED ON PREVIOUS CULTURE WITHIN THE LAST 5 DAYS. Performed at [facility], [place] [address], [place], [place] [address]    Report Status 01/13/2020 FINAL  Final  Urine culture     Status: Abnormal   Collection Time: 01/10/20  8:57 PM   Specimen: In/Out Cath Urine  Result Value Ref Range Status   Specimen Description   Final    IN/OUT CATH URINE Performed at [place] [address] [place], [place] [address]    Special Requests   Final    NONE Performed at [facility], [place] [address], [place], [place] [address]    Culture MULTIPLE SPECIES PRESENT, SUGGEST RECOLLECTION (A)  Final   Report Status 01/12/2020 FINAL  Final  Blood Culture ID Panel (Reflexed)     Status: Abnormal   Collection Time: 01/10/20  8:57 PM  Result Value Ref Range Status   Enterococcus species NOT DETECTED NOT DETECTED Final   Listeria monocytogenes NOT DETECTED NOT DETECTED Final   Staphylococcus species NOT DETECTED NOT DETECTED Final   Staphylococcus aureus (BCID) NOT DETECTED NOT DETECTED Final   Streptococcus species NOT DETECTED NOT DETECTED Final   Streptococcus agalactiae NOT DETECTED NOT DETECTED Final   Streptococcus pneumoniae NOT DETECTED NOT DETECTED Final   Streptococcus pyogenes NOT DETECTED NOT DETECTED Final   Acinetobacter baumannii NOT DETECTED NOT DETECTED Final   Enterobacteriaceae species DETECTED (A) NOT DETECTED Final    Comment: Enterobacteriaceae represent a large family of gram-negative bacteria, not a single organism. CRITICAL RESULT CALLED TO, READ BACK BY AND VERIFIED WITH: N. [place], PHARMD (WL) AT 1235 ON 01/11/20 BY C. JESSUP,  MT.    Enterobacter cloacae complex NOT DETECTED NOT DETECTED Final   Escherichia coli DETECTED (A) NOT DETECTED Final    Comment: CRITICAL RESULT CALLED TO, READ BACK BY AND VERIFIED WITH: N. [place], PHARMD (WL) AT 1235 ON 01/11/20 BY C. JESSUP, MT.    Klebsiella oxytoca NOT DETECTED NOT DETECTED Final   Klebsiella pneumoniae NOT DETECTED NOT DETECTED Final   Proteus species NOT DETECTED NOT DETECTED Final   Serratia marcescens NOT DETECTED NOT DETECTED Final   Carbapenem resistance NOT DETECTED NOT DETECTED Final   Haemophilus influenzae NOT DETECTED NOT DETECTED Final   Neisseria meningitidis NOT DETECTED NOT DETECTED Final   Pseudomonas aeruginosa NOT DETECTED NOT DETECTED Final  Candida albicans NOT DETECTED NOT DETECTED Final   Candida glabrata NOT DETECTED NOT DETECTED Final   Candida krusei NOT DETECTED NOT DETECTED Final   Candida parapsilosis NOT DETECTED NOT DETECTED Final   Candida tropicalis NOT DETECTED NOT DETECTED Final    Comment: Performed at [facility], [place] [address] [place], [place] [address]  Culture, blood (routine x 2)     Status: None (Preliminary result)   Collection Time: 01/12/20  9:23 AM   Specimen: BLOOD RIGHT HAND  Result Value Ref Range Status   Specimen Description   Final    BLOOD RIGHT HAND Performed at [place] [address] [place], [place] [address]    Special Requests   Final    BOTTLES DRAWN AEROBIC AND ANAEROBIC Blood Culture adequate volume Performed at [place] [address], [place], [place] [address]    Culture   Final    NO GROWTH 1 DAY Performed at [facility], [place] [address], [place], [place] [address]    Report Status PENDING  Incomplete  Culture, blood (routine x 2)     Status: None (Preliminary result)   Collection Time: 01/12/20  9:35 AM   Specimen: BLOOD  Result Value Ref Range Status   Specimen Description   Final    BLOOD LEFT ARM Performed at [place] [address] [place], [place] [address]    Special Requests   Final    BOTTLES DRAWN AEROBIC ONLY Blood Culture adequate volume Performed at [place] [address] [place], [place] [address]    Culture   Final    NO GROWTH 1 DAY Performed at [facility], [place] [address], [place], [place] [address]    Report Status PENDING  Incomplete     Labs: BNP (last 3 results) No results for input(s): BNP in the last 8760 hours. Basic Metabolic Panel: Recent Labs  Lab 01/10/20 1851 01/11/20 0033 01/11/20 0459 01/12/20 0326 01/13/20 1544  NA 137  --  136 138 139  K 3.7  --  3.7 3.7 3.6  CL 103  --  106 108 106  CO2 25  --  21* 21* 24  GLUCOSE 156*  --  180* 212* 145*  BUN 11  --  '8 10 12  ' CREATININE 0.71 0.58 0.71 0.69 0.69  CALCIUM 9.1  --  8.1* 8.5* 9.0   Liver Function Tests: Recent Labs  Lab 01/10/20 1851  AST 26  ALT 30  ALKPHOS 65  BILITOT 1.5*  PROT 7.7  ALBUMIN 4.4   Recent Labs  Lab 01/10/20 1851  LIPASE 47   No results for input(s): AMMONIA in the last 168 hours. CBC: Recent Labs  Lab 01/10/20 1851 01/11/20 0033 01/11/20 0459 01/12/20 0326 01/13/20 1544  WBC 11.8* 11.0* 12.5* 6.7 7.0  HGB 11.8* 10.7* 10.4* 9.0* 10.1*  HCT 34.8* 32.7* 31.1* 27.5* 29.4*  MCV 88.3 90.6 89.9 90.2 88.3  PLT 212 177 185 163 212   Cardiac Enzymes: No results for input(s): CKTOTAL, CKMB, CKMBINDEX, TROPONINI in the last 168 hours. BNP: Invalid input(s): POCBNP CBG: Recent Labs  Lab 01/13/20 0747 01/13/20 1133 01/13/20 1552 01/13/20 2208 01/14/20 0806  GLUCAP 113* 139* 146* 167* 102*   D-Dimer No results for input(s): DDIMER in the last 72 hours. Hgb A1c No results for input(s): HGBA1C in the last 72 hours. Lipid Profile No results for input(s): CHOL, HDL, LDLCALC, TRIG,  CHOLHDL, LDLDIRECT in the last 72 hours. Thyroid function studies No results for input(s): TSH, T4TOTAL, T3FREE, THYROIDAB in the  last 72 hours.  Invalid input(s): FREET3 Anemia work up Recent Labs    01/12/20 0326  VITAMINB12 183  FOLATE 6.9  FERRITIN 675*  TIBC 260  IRON 26*  RETICCTPCT 1.1   Urinalysis    Component Value Date/Time   COLORURINE YELLOW 01/10/2020 2057   APPEARANCEUR CLOUDY (A) 01/10/2020 2057   LABSPEC 1.009 01/10/2020 2057   PHURINE 6.0 01/10/2020 2057   GLUCOSEU NEGATIVE 01/10/2020 2057   HGBUR MODERATE (A) 01/10/2020 2057   HGBUR moderate 01/29/2009 1517   BILIRUBINUR NEGATIVE 01/10/2020 2057   BILIRUBINUR NEG 01/19/2013 1123   KETONESUR NEGATIVE 01/10/2020 2057   PROTEINUR 30 (A) 01/10/2020 2057   UROBILINOGEN 0.2 01/31/2019 1052   NITRITE NEGATIVE 01/10/2020 2057   LEUKOCYTESUR LARGE (A) 01/10/2020 2057   Sepsis Labs Invalid input(s): PROCALCITONIN,  WBC,  LACTICIDVEN Microbiology Recent Results (from the past 240 hour(s))  Respiratory Panel by RT PCR (Flu A&B, Covid) - Nasopharyngeal Swab     Status: None   Collection Time: 01/10/20  8:21 PM   Specimen: Nasopharyngeal Swab  Result Value Ref Range Status   SARS Coronavirus 2 by RT PCR NEGATIVE NEGATIVE Final    Comment: (NOTE) SARS-CoV-2 target nucleic acids are NOT DETECTED. The SARS-CoV-2 RNA is generally detectable in upper respiratoy specimens during the acute phase of infection. The lowest concentration of SARS-CoV-2 viral copies this assay can detect is 131 copies/mL. A negative result does not preclude SARS-Cov-2 infection and should not be used as the sole basis for treatment or other patient management decisions. A negative result may occur with  improper specimen collection/handling, submission of specimen other than nasopharyngeal swab, presence of viral mutation(s) within the areas targeted by this assay, and inadequate number of viral copies (<131 copies/mL). A negative result must be combined with clinical observations, patient history, and epidemiological information. The expected result is  Negative. Fact Sheet for Patients:  PinkCheek.be Fact Sheet for Healthcare Providers:  GravelBags.it This test is not yet ap proved or cleared by the [place] FDA and  has been authorized for detection and/or diagnosis of SARS-CoV-2 by FDA under an Emergency Use Authorization (EUA). This EUA will remain  in effect (meaning this test can be used) for the duration of the COVID-19 declaration under Section 564(b)(1) of the Act, 21 U.S.C. section 360bbb-3(b)(1), unless the authorization is terminated or revoked sooner.    Influenza A by PCR NEGATIVE NEGATIVE Final   Influenza B by PCR NEGATIVE NEGATIVE Final    Comment: (NOTE) The Xpert Xpress SARS-CoV-2/FLU/RSV assay is intended as an aid in  the diagnosis of influenza from Nasopharyngeal swab specimens and  should not be used as a sole basis for treatment. Nasal washings and  aspirates are unacceptable for Xpert Xpress SARS-CoV-2/FLU/RSV  testing. Fact Sheet for Patients: PinkCheek.be Fact Sheet for Healthcare Providers: GravelBags.it This test is not yet approved or cleared by the [place] FDA and  has been authorized for detection and/or diagnosis of SARS-CoV-2 by  FDA under an Emergency Use Authorization (EUA). This EUA will remain  in effect (meaning this test can be used) for the duration of the  Covid-19 declaration under Section 564(b)(1) of the Act, 21  U.S.C. section 360bbb-3(b)(1), unless the authorization is  terminated or revoked. Performed at [facility], [place] [address], [place], [place] [address]   Blood Culture (routine x 2)  Status: Abnormal   Collection Time: 01/10/20  8:57 PM   Specimen: BLOOD  Result Value Ref Range Status   Specimen Description   Final    BLOOD LEFT ANTECUBITAL Performed at [facility], [place] [address] [place], [place] [address]     Special Requests   Final    BOTTLES DRAWN AEROBIC AND ANAEROBIC Blood Culture adequate volume Performed at [place] [address] [place], [place] [address]    Culture  Setup Time   Final    GRAM NEGATIVE RODS IN BOTH AEROBIC AND ANAEROBIC BOTTLES CRITICAL RESULT CALLED TO, READ BACK BY AND VERIFIED WITH: N. [place], PHARMD (WL) AT 2993 ON 01/11/20 BY C. JESSUP, MT. Performed at [facility], [place] [address], [place], [place] [address]    Culture ESCHERICHIA COLI (A)  Final   Report Status 01/13/2020 FINAL  Final   Organism ID, Bacteria ESCHERICHIA COLI  Final      Susceptibility   Escherichia coli - MIC*    AMPICILLIN <=2 SENSITIVE Sensitive     CEFAZOLIN <=4 SENSITIVE Sensitive     CEFEPIME <=1 SENSITIVE Sensitive     CEFTAZIDIME <=1 SENSITIVE Sensitive     CEFTRIAXONE <=1 SENSITIVE Sensitive     CIPROFLOXACIN <=0.25 SENSITIVE Sensitive     GENTAMICIN <=1 SENSITIVE Sensitive     IMIPENEM <=0.25 SENSITIVE Sensitive     TRIMETH/SULFA >=320 RESISTANT Resistant     AMPICILLIN/SULBACTAM <=2 SENSITIVE Sensitive     PIP/TAZO <=4 SENSITIVE Sensitive     * ESCHERICHIA COLI  Blood Culture (routine x 2)     Status: Abnormal   Collection Time: 01/10/20  8:57 PM   Specimen: BLOOD  Result Value Ref Range Status   Specimen Description   Final    BLOOD RIGHT ANTECUBITAL Performed at [facility], [place] [address], [place], [place] [address]    Special Requests   Final    BOTTLES DRAWN AEROBIC AND ANAEROBIC Blood Culture adequate volume Performed at [place] [address], [place], [place] [address]    Culture  Setup Time   Final    GRAM NEGATIVE RODS ANAEROBIC BOTTLE ONLY CRITICAL RESULT CALLED TO, READ BACK BY AND VERIFIED WITH: N. [place], PHARMD (WL) AT 1025 ON 01/11/20 BY C. JESSUP, MT.    Culture (A)  Final    ESCHERICHIA COLI SUSCEPTIBILITIES PERFORMED ON PREVIOUS CULTURE WITHIN THE LAST 5 DAYS. Performed at [facility], [place] [address] [place], [place] [address]    Report Status 01/13/2020 FINAL  Final  Urine culture     Status: Abnormal   Collection Time: 01/10/20  8:57 PM   Specimen: In/Out Cath Urine  Result Value Ref Range Status   Specimen Description   Final    IN/OUT CATH URINE Performed at [place] [address], [place], [place] [address]    Special Requests   Final    NONE Performed at [facility], [place] [address], [place], [place] [address]    Culture MULTIPLE SPECIES PRESENT, SUGGEST RECOLLECTION (A)  Final   Report Status 01/12/2020 FINAL  Final  Blood Culture ID Panel (Reflexed)     Status: Abnormal   Collection Time: 01/10/20  8:57 PM  Result Value Ref Range Status   Enterococcus species NOT DETECTED NOT DETECTED Final   Listeria monocytogenes NOT DETECTED NOT DETECTED Final   Staphylococcus species NOT DETECTED NOT DETECTED Final   Staphylococcus  aureus (BCID) NOT DETECTED NOT DETECTED Final   Streptococcus species NOT DETECTED NOT DETECTED Final   Streptococcus agalactiae NOT DETECTED NOT DETECTED Final   Streptococcus pneumoniae NOT DETECTED NOT DETECTED Final   Streptococcus pyogenes NOT DETECTED NOT DETECTED Final   Acinetobacter baumannii NOT DETECTED NOT DETECTED Final   Enterobacteriaceae species DETECTED (A) NOT DETECTED Final    Comment: Enterobacteriaceae represent a large family of gram-negative bacteria, not a single organism. CRITICAL RESULT CALLED TO, READ BACK BY AND VERIFIED WITH: N. [place], PHARMD ([place]) AT 1235 ON 01/11/20 BY C. JESSUP, MT.    Enterobacter cloacae complex NOT DETECTED NOT DETECTED Final   Escherichia coli DETECTED (A) NOT DETECTED Final    Comment: CRITICAL RESULT CALLED TO, READ BACK BY AND VERIFIED WITH: N. [place], PHARMD ([facility]) AT 1235 ON 01/11/20 BY C. JESSUP, MT.    Klebsiella oxytoca NOT DETECTED NOT DETECTED Final   Klebsiella pneumoniae NOT DETECTED NOT DETECTED Final   Proteus  species NOT DETECTED NOT DETECTED Final   Serratia marcescens NOT DETECTED NOT DETECTED Final   Carbapenem resistance NOT DETECTED NOT DETECTED Final   Haemophilus influenzae NOT DETECTED NOT DETECTED Final   Neisseria meningitidis NOT DETECTED NOT DETECTED Final   Pseudomonas aeruginosa NOT DETECTED NOT DETECTED Final   Candida albicans NOT DETECTED NOT DETECTED Final   Candida glabrata NOT DETECTED NOT DETECTED Final   Candida krusei NOT DETECTED NOT DETECTED Final   Candida parapsilosis NOT DETECTED NOT DETECTED Final   Candida tropicalis NOT DETECTED NOT DETECTED Final    Comment: Performed at [facility], [place] [address] [place], [place] [address]  Culture, blood (routine x 2)     Status: None (Preliminary result)   Collection Time: 01/12/20  9:23 AM   Specimen: BLOOD RIGHT HAND  Result Value Ref Range Status   Specimen Description   Final    BLOOD RIGHT HAND Performed at [facility] [address] [place], [place] [address]    Special Requests   Final    BOTTLES DRAWN AEROBIC AND ANAEROBIC Blood Culture adequate volume Performed at [facility] [address] [place], [place] [address]    Culture   Final    NO GROWTH 1 DAY Performed at [facility], [place] [address] [place] [address], [place], [place] [address]    Report Status PENDING  Incomplete  Culture, blood (routine x 2)     Status: None (Preliminary result)   Collection Time: 01/12/20  9:35 AM   Specimen: BLOOD  Result Value Ref Range Status   Specimen Description   Final    BLOOD LEFT ARM Performed at [facility] [address], [place], [place] [address]    Special Requests   Final    BOTTLES DRAWN AEROBIC ONLY Blood Culture adequate volume Performed at [facility] [address], [place], [place] [address]    Culture   Final    NO GROWTH 1 DAY Performed at [facility], [place] [address] [place], [place] [address]    Report  Status PENDING  Incomplete     Time coordinating discharge: 40 minutes  SIGNED:   Elmarie Shiley, MD  [facility]

## 2020-01-17 LAB — CULTURE, BLOOD (ROUTINE X 2)
Culture: NO GROWTH
Culture: NO GROWTH
Special Requests: ADEQUATE
Special Requests: ADEQUATE

## 2020-04-02 ENCOUNTER — Other Ambulatory Visit: Payer: Self-pay

## 2020-04-02 ENCOUNTER — Encounter (HOSPITAL_COMMUNITY): Payer: Self-pay | Admitting: Emergency Medicine

## 2020-04-02 ENCOUNTER — Emergency Department (HOSPITAL_COMMUNITY): Payer: Medicare HMO

## 2020-04-02 ENCOUNTER — Emergency Department (HOSPITAL_COMMUNITY)
Admission: EM | Admit: 2020-04-02 | Discharge: 2020-04-02 | Disposition: A | Discharge status: Home / Self Care | Payer: Medicare HMO | Attending: Emergency Medicine | Admitting: Emergency Medicine

## 2020-04-02 DIAGNOSIS — E119 Type 2 diabetes mellitus without complications: Secondary | ICD-10-CM | POA: Diagnosis not present

## 2020-04-02 DIAGNOSIS — Z87891 Personal history of nicotine dependence: Secondary | ICD-10-CM | POA: Diagnosis not present

## 2020-04-02 DIAGNOSIS — Z9049 Acquired absence of other specified parts of digestive tract: Secondary | ICD-10-CM | POA: Insufficient documentation

## 2020-04-02 DIAGNOSIS — Y939 Activity, unspecified: Secondary | ICD-10-CM | POA: Diagnosis not present

## 2020-04-02 DIAGNOSIS — Z79899 Other long term (current) drug therapy: Secondary | ICD-10-CM | POA: Insufficient documentation

## 2020-04-02 DIAGNOSIS — I10 Essential (primary) hypertension: Secondary | ICD-10-CM | POA: Insufficient documentation

## 2020-04-02 DIAGNOSIS — Z794 Long term (current) use of insulin: Secondary | ICD-10-CM | POA: Diagnosis not present

## 2020-04-02 DIAGNOSIS — S39011A Strain of muscle, fascia and tendon of abdomen, initial encounter: Secondary | ICD-10-CM | POA: Insufficient documentation

## 2020-04-02 DIAGNOSIS — K760 Fatty (change of) liver, not elsewhere classified: Secondary | ICD-10-CM | POA: Diagnosis not present

## 2020-04-02 DIAGNOSIS — S3991XA Unspecified injury of abdomen, initial encounter: Secondary | ICD-10-CM | POA: Diagnosis present

## 2020-04-02 DIAGNOSIS — Y929 Unspecified place or not applicable: Secondary | ICD-10-CM | POA: Insufficient documentation

## 2020-04-02 DIAGNOSIS — T148XXA Other injury of unspecified body region, initial encounter: Secondary | ICD-10-CM

## 2020-04-02 DIAGNOSIS — X58XXXA Exposure to other specified factors, initial encounter: Secondary | ICD-10-CM | POA: Diagnosis not present

## 2020-04-02 DIAGNOSIS — Z793 Long term (current) use of hormonal contraceptives: Secondary | ICD-10-CM | POA: Insufficient documentation

## 2020-04-02 DIAGNOSIS — Y999 Unspecified external cause status: Secondary | ICD-10-CM | POA: Diagnosis not present

## 2020-04-02 DIAGNOSIS — Z87442 Personal history of urinary calculi: Secondary | ICD-10-CM | POA: Diagnosis not present

## 2020-04-02 DIAGNOSIS — R109 Unspecified abdominal pain: Secondary | ICD-10-CM

## 2020-04-02 LAB — URINALYSIS, ROUTINE W REFLEX MICROSCOPIC
Bilirubin Urine: NEGATIVE
Glucose, UA: NEGATIVE mg/dL
Hgb urine dipstick: NEGATIVE
Ketones, ur: NEGATIVE mg/dL
Leukocytes,Ua: NEGATIVE
Nitrite: NEGATIVE
Protein, ur: NEGATIVE mg/dL
Specific Gravity, Urine: 1.012 (ref 1.005–1.030)
pH: 5 (ref 5.0–8.0)

## 2020-04-02 LAB — CBC WITH DIFFERENTIAL/PLATELET
Abs Immature Granulocytes: 0.07 10*3/uL (ref 0.00–0.07)
Basophils Absolute: 0 10*3/uL (ref 0.0–0.1)
Basophils Relative: 1 %
Eosinophils Absolute: 0.2 10*3/uL (ref 0.0–0.5)
Eosinophils Relative: 2 %
HCT: 39.3 % (ref 36.0–46.0)
Hemoglobin: 13.1 g/dL (ref 12.0–15.0)
Immature Granulocytes: 1 %
Lymphocytes Relative: 37 %
Lymphs Abs: 3 10*3/uL (ref 0.7–4.0)
MCH: 29.6 pg (ref 26.0–34.0)
MCHC: 33.3 g/dL (ref 30.0–36.0)
MCV: 88.9 fL (ref 80.0–100.0)
Monocytes Absolute: 0.5 10*3/uL (ref 0.1–1.0)
Monocytes Relative: 6 %
Neutro Abs: 4.4 10*3/uL (ref 1.7–7.7)
Neutrophils Relative %: 53 %
Platelets: 266 10*3/uL (ref 150–400)
RBC: 4.42 MIL/uL (ref 3.87–5.11)
RDW: 12.3 % (ref 11.5–15.5)
WBC: 8.1 10*3/uL (ref 4.0–10.5)
nRBC: 0 % (ref 0.0–0.2)

## 2020-04-02 LAB — BASIC METABOLIC PANEL
Anion gap: 10 (ref 5–15)
BUN: 12 mg/dL (ref 6–20)
CO2: 25 mmol/L (ref 22–32)
Calcium: 9.3 mg/dL (ref 8.9–10.3)
Chloride: 103 mmol/L (ref 98–111)
Creatinine, Ser: 0.66 mg/dL (ref 0.44–1.00)
GFR calc Af Amer: >60 mL/min (ref >60)
GFR calc non Af Amer: >60 mL/min (ref >60)
Glucose, Bld: 196 mg/dL — ABNORMAL HIGH (ref 70–99)
Potassium: 4.3 mmol/L (ref 3.5–5.1)
Sodium: 138 mmol/L (ref 135–145)

## 2020-04-02 LAB — I-STAT BETA HCG BLOOD, ED (MC, WL, AP ONLY): I-stat hCG, quantitative: <5 m[IU]/mL (ref <5)

## 2020-04-02 MED ORDER — LIDOCAINE 5 % EX PTCH: 1.0000 | MEDICATED_PATCH | CUTANEOUS | 0 refills | Status: DC

## 2020-04-02 MED ORDER — NAPROXEN 375 MG PO TABS
375.0000 mg | ORAL_TABLET | Freq: Two times a day (BID) | ORAL | 0 refills | Status: DC
Start: 2020-04-02 — End: 2020-08-11

## 2020-04-02 MED ORDER — OXYCODONE-ACETAMINOPHEN 5-325 MG PO TABS
1.0000 | ORAL_TABLET | Freq: Once | ORAL | Status: AC
  Administered 2020-04-02: 1 via ORAL
  Filled 2020-04-02: qty 1

## 2020-04-02 MED ORDER — ONDANSETRON HCL 4 MG/2ML IJ SOLN
4.0000 mg | Freq: Once | INTRAMUSCULAR | Status: AC
  Administered 2020-04-02: 4 mg via INTRAVENOUS
  Filled 2020-04-02: qty 2

## 2020-04-02 MED ORDER — FENTANYL CITRATE (PF) 100 MCG/2ML IJ SOLN
50.0000 ug | Freq: Once | INTRAMUSCULAR | Status: AC
  Administered 2020-04-02: 50 ug via INTRAVENOUS
  Filled 2020-04-02: qty 2

## 2020-04-02 NOTE — Discharge Instructions (Signed)
You are seen today for flank pain, this is most likely due to muscle strain as we talked about.  I want you to use the attached instructions.  Take naproxen as prescribed and you can also use the Lidoderm patches.  Only to follow-up with your primary care in the next couple of days.  If you start having any worsening pain or new concerning symptoms, high fevers, inability to keep anything down, it, to the emergency department.

## 2020-04-02 NOTE — ED Provider Notes (Signed)
[facility] EMERGENCY DEPARTMENT Provider Note   CSN: [id number] Arrival date & time: 04/02/20  0383     History Chief Complaint  Patient presents with  . Flank Pain    Melissa Washington is a 34 y.o. female with pertinent past medical history of hypertension, diabetes, nephrolithiasis, pyelonephritis that presents the emergency department today for flank pain.  Patient states that she started having flank pain on her right side yesterday, woke her up in the middle of the night and is now radiating down into her abdomen on her right side.  Denies any groin pain, pelvic discomfort, vaginal bleeding, vaginal discharge.  Denies any dysuria or hematuria.  Patient states that she has had pyelonephritis and nephrolithiasis in the past.  Denies any fevers, chills, URI-like symptoms.  Denies any chest pain, shortness of breath, back pain, abdominal pain, nausea, vomiting.  Patient states that she did feel slightly nauseous after Percocet here.  Denies any more nausea.  States that she has been eating and drinking normally.  Was in normal health before this.  Has been taking her medications as prescribed.  Has not taken anything for this.  Denies any trauma to this area.  HPI     Past Medical History:  Diagnosis Date  . 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 Bacteremia associated with IV line ([place]) 05/01/2013   Port-a-cath infection, removed 8/6 by IR. Ecoli grew out in cultures. Sent home with Ceftriaxone to complete 14 day course.   . Bacteremia due to Escherichia coli 05/01/2013   Port-a-cath infection, removed 8/6 by IR. Ecoli grew out in cultures. Sent home with Ceftriaxone to complete 14 day course.   . Cholelithiasis 01/17/2012  . Collagen vascular disease ([place])   . Diabetes mellitus    type 2, insulin  . Headache(784.0)   . Hepatic steatosis 01/17/2012  . Hypertension   . Leukemia, acute monocytic, in remission ([place])   . Nephrolithiasis   . Obesity       Patient Active Problem List   Diagnosis Date Noted  . Pyelonephritis 01/10/2020  . Trochanteric bursitis, right hip 09/28/2018  . Trochanteric bursitis, left hip 09/28/2018  . Acute pyelonephritis 04/07/2017  . Vaginitis and vulvovaginitis 10/09/2014  . Alopecia 06/26/2014  . Bilateral hip pain 06/19/2014  . Anxiety associated with depression 06/19/2014  . Allergic rhinitis 06/07/2014  . IUD complication ([place]) 33/83/2919  . Exposure to STD 06/07/2014  . 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 Morbid obesity with BMI of 50.0-59.9, adult ([place]) 01/20/2012  . DM (diabetes mellitus), type 2, uncontrolled ([place]) 12/30/2006  . HYPERTENSION, BENIGN ESSENTIAL 12/30/2006    Past Surgical History:  Procedure Laterality Date  . CHOLECYSTECTOMY  01/31/2012   Procedure: LAPAROSCOPIC CHOLECYSTECTOMY WITH INTRAOPERATIVE CHOLANGIOGRAM;  Surgeon: Marcello Moores A. Cornett, MD;  Location: [facility];  Service: General;  Laterality: N/A;  . PORTACATH PLACEMENT     [place]  . portacath removal       OB History    Gravida  2   Para  2   Term  2   Preterm  0   AB  0   Living  2     SAB  0   TAB  0   Ectopic  0   Multiple  0   Live Births  2           Family History  Problem Relation Age of Onset  . Kidney disease Mother   .  Hypertension Mother   . Epilepsy Mother   . Sleep apnea Mother   . Diabetes Father   . Kidney disease Maternal Grandmother   . Diabetes Maternal Grandmother     Social History   Tobacco Use  . Smoking status: Former Research scientist (life sciences)  . Smokeless tobacco: Never Used  Vaping Use  . Vaping Use: Never used  Substance Use Topics  . Alcohol use: No  . Drug use: No    Home Medications Prior to Admission medications   Medication Sig Start Date End Date Taking? Authorizing Provider  Insulin Glargine (LANTUS) 100 UNIT/ML Solostar Pen Inject 30 Units into the skin daily at 10 pm. Patient taking differently: Inject 32 Units into the skin daily at 10  pm.  04/11/17 09/18/23 Yes Dessa Phi, DO  levonorgestrel (MIRENA) 20 MCG/24HR IUD 1 each by Intrauterine route continuous.   Yes [provider]  lisinopril (ZESTRIL) 10 MG tablet Take 1 tablet by mouth daily. 02/17/20  Yes [provider]  metFORMIN (GLUCOPHAGE-XR) 500 MG 24 hr tablet Take 500 mg by mouth 2 (two) times daily. 11/10/19  Yes [provider]  NOVOLOG FLEXPEN 100 UNIT/ML FlexPen Inject 3-20 Units into the skin in the morning, at noon, and at bedtime. 11/10/19  Yes [provider]  vitamin B-12 100 MCG tablet Take 1 tablet (100 mcg total) by mouth daily. 01/14/20  Yes Regalado, Belkys A, MD  blood glucose meter kit and supplies Dispense based on patient and insurance preference. Use up to four times daily as directed. (FOR ICD-9 250.00, 250.01). 04/11/17   Dessa Phi, DO  ferrous sulfate 325 (65 FE) MG tablet Take 1 tablet (325 mg total) by mouth daily with breakfast. Patient not taking: Reported on 04/02/2020 01/15/20   Regalado, Jerald Kief A, MD  HYDROcodone-acetaminophen (NORCO) 10-325 MG tablet Take 1 tablet by mouth every 6 (six) hours as needed. Patient not taking: Reported on 04/02/2020 01/14/20   Niel Hummer A, MD  Insulin Pen Needle 31G X 5 MM MISC Use with insulin, 4 times daily 04/11/17   Dessa Phi, DO  lidocaine (LIDODERM) 5 % Place 1 patch onto the skin daily. Remove & Discard patch within 12 hours or as directed by MD 04/02/20   Alfredia Client, PA-C  naproxen (NAPROSYN) 375 MG tablet Take 1 tablet (375 mg total) by mouth 2 (two) times daily. 04/02/20   Alfredia Client, PA-C    Allergies    Ultram [place] Mode hcl]  Review of Systems   Review of Systems  Constitutional: Negative for chills, diaphoresis, fatigue and fever.  HENT: Negative for congestion, sore throat and trouble swallowing.   Eyes: Negative for pain and visual disturbance.  Respiratory: Negative for cough, shortness of breath and wheezing.   Cardiovascular: Negative for  chest pain, palpitations and leg swelling.  Gastrointestinal: Negative for abdominal distention, abdominal pain, diarrhea, nausea and vomiting.  Genitourinary: Positive for flank pain. Negative for decreased urine volume, difficulty urinating, dyspareunia, dysuria, enuresis, frequency, genital sores, hematuria, menstrual problem, pelvic pain, urgency, vaginal bleeding, vaginal discharge and vaginal pain.  Musculoskeletal: Negative for back pain, neck pain and neck stiffness.  Skin: Negative for pallor.  Neurological: Negative for dizziness, speech difficulty, weakness and headaches.  Psychiatric/Behavioral: Negative for confusion.    Physical Exam Updated Vital Signs BP 130/83 (BP Location: Right Arm)   Pulse 68   Temp 98.4 F (36.9 C) (Oral)   Resp 16   SpO2 100%   Physical Exam Constitutional:      General:  She is not in acute distress.    Appearance: Normal appearance. She is not ill-appearing, toxic-appearing or diaphoretic.  HENT:     Head: Normocephalic and atraumatic.     Mouth/Throat:     Mouth: Mucous membranes are moist.     Pharynx: Oropharynx is clear.  Eyes:     General: No scleral icterus.    Extraocular Movements: Extraocular movements intact.     Pupils: Pupils are equal, round, and reactive to light.  Cardiovascular:     Rate and Rhythm: Normal rate and regular rhythm.     Pulses: Normal pulses.     Heart sounds: Normal heart sounds.  Pulmonary:     Effort: Pulmonary effort is normal. No respiratory distress.     Breath sounds: Normal breath sounds. No stridor. No wheezing, rhonchi or rales.  Chest:     Chest wall: No tenderness.  Abdominal:     General: Abdomen is flat. There is no distension.     Palpations: Abdomen is soft. There is no mass.     Tenderness: There is abdominal tenderness (Right lower quadrant). There is right CVA tenderness. There is no left CVA tenderness, guarding or rebound.     Hernia: No hernia is present.  Musculoskeletal:         General: No swelling or tenderness. Normal range of motion.     Cervical back: Normal range of motion and neck supple. No rigidity.     Right lower leg: No edema.     Left lower leg: No edema.     Comments: Patient with tenderness over right flank, over into right lower quadrant of abdomen.  Patient's pain worsens when patient moves.  No midline spine tenderness to cervical, thoracic, lumbar regions.  Skin:    General: Skin is warm and dry.     Capillary Refill: Capillary refill takes less than 2 seconds.     Coloration: Skin is not pale.  Neurological:     General: No focal deficit present.     Mental Status: She is alert and oriented to person, place, and time. Mental status is at baseline.     Cranial Nerves: No cranial nerve deficit.     Sensory: No sensory deficit.     Motor: No weakness.     Coordination: Coordination normal.  Psychiatric:        Mood and Affect: Mood normal.        Behavior: Behavior normal.     ED Results / Procedures / Treatments   Labs (all labs ordered are listed, but only abnormal results are displayed) Labs Reviewed  URINALYSIS, ROUTINE W REFLEX MICROSCOPIC - Abnormal; Notable for the following components:      Result Value   APPearance HAZY (*)    All other components within normal limits  BASIC METABOLIC PANEL - Abnormal; Notable for the following components:   Glucose, Bld 196 (*)    All other components within normal limits  CBC WITH DIFFERENTIAL/PLATELET  I-STAT BETA HCG BLOOD, ED ([facility], [facility], [facility] ONLY)    EKG None  Radiology CT Renal Stone Study  Result Date: 04/02/2020 CLINICAL DATA:  Right flank and groin pain since this morning. EXAM: CT ABDOMEN AND PELVIS WITHOUT CONTRAST TECHNIQUE: Multidetector CT imaging of the abdomen and pelvis was performed following the standard protocol without IV contrast. COMPARISON:  CT scan 01/10/2020 FINDINGS: Lower chest: Insert lung bases Hepatobiliary: Diffuse fatty infiltration of the liver but no focal  hepatic lesions. The gallbladder is surgically  absent. No biliary dilatation. Pancreas: No mass, inflammation or ductal dilatation. Spleen: Normal size. No focal lesions. Adrenals/Urinary Tract: Adrenal glands are normal. No renal or obstructing ureteral calculi are identified. No bladder calculi. No worrisome renal or bladder lesions are identified without contrast. Stomach/Bowel: The stomach, duodenum, small bowel and colon are grossly normal without oral contrast. No inflammatory changes, mass lesions or obstructive findings. The appendix is normal. Vascular/Lymphatic: The aorta is normal in caliber. No atheroscerlotic calcifications. No mesenteric of retroperitoneal mass or adenopathy. Small scattered lymph nodes are noted. Reproductive: The uterus and ovaries are unremarkable. An IUD is noted in the endometrial canal. Other: No pelvic mass or adenopathy. No free pelvic fluid collections. No inguinal mass or adenopathy. No abdominal wall hernia or subcutaneous lesions. Musculoskeletal: No significant bony findings. IMPRESSION: 1. No acute abdominal/pelvic findings, mass lesions or adenopathy. 2. No renal, obstructing ureteral calculi or bladder calculi. 3. Diffuse fatty infiltration of the liver. 4. Status post cholecystectomy. No biliary dilatation. Electronically Signed   By: Marijo Sanes M.D.   On: 04/02/2020 12:56    Procedures Procedures (including critical care time)  Medications Ordered in ED Medications  oxyCODONE-acetaminophen (PERCOCET/ROXICET) 5-325 MG per tablet 1 tablet (1 tablet Oral Given 04/02/20 0554)  fentaNYL (SUBLIMAZE) injection 50 mcg (50 mcg Intravenous Given 04/02/20 1254)  ondansetron (ZOFRAN) injection 4 mg (4 mg Intravenous Given 04/02/20 1337)    ED Course  I have reviewed the triage vital signs and the nursing notes.  Pertinent labs & imaging results that were available during my care of the patient were reviewed by me and considered in my medical decision making (see  chart for details).    MDM Rules/Calculators/A&P                         Melissa Washington is a 34 y.o. female with pertinent past medical history of hypertension, diabetes, nephrolithiasis, pyelonephritis that presents the emergency department today for flank pain.  Patient does not appear to be in acute distress, normal vital signs.  CBC and CMP without any Abnormalities, urinalysis clean.  Unlikely to be appendicitis or pyelonephritis at this time.  Will obtain CT renal study.  Pain being controlled with fentanyl. CT renal study negative, no signs of appendicitis.  Tolerating p.o. fine with ginger ale.  Has not vomited while in the ER.  Normal vital signs.  Patient to be discharged.  Most likely  muscle strain, did discuss this with patient.  Patient to follow-up with primary care in the next couple of days.  Patient agreeable.  Doubt need for further emergent work up at this time. I explained the diagnosis and have given explicit precautions to return to the ER including for any other new or worsening symptoms. The patient understands and accepts the medical plan as it's been dictated and I have answered their questions. Discharge instructions concerning home care and prescriptions have been given. The patient is STABLE and is discharged to home in good condition.   Final Clinical Impression(s) / ED Diagnoses Final diagnoses:  Flank pain  Muscle strain    Rx / DC Orders ED Discharge Orders         Ordered    naproxen (NAPROSYN) 375 MG tablet  2 times daily     Discontinue  Reprint     04/02/20 1400    lidocaine (LIDODERM) 5 %  Every 24 hours     Discontinue  Reprint     04/02/20  [place], Bastian Andreoli, PA-C 04/02/20 1421    Valarie Merino, MD 04/05/20 [phone]

## 2020-04-02 NOTE — ED Notes (Signed)
Patient verbalizes understanding of discharge instructions . Opportunity for questions and answers were provided . Armband removed by staff ,Pt discharged from ED. W/C  offered at D/C  and Declined W/C at D/C and was escorted to lobby by RN.  

## 2020-04-02 NOTE — ED Triage Notes (Signed)
Patient reports right low flank pain radiating to right groin onset this morning , denies injury , no hematuria or dysuria . No fever or chills .

## 2020-04-03 ENCOUNTER — Other Ambulatory Visit: Payer: Self-pay

## 2020-04-03 ENCOUNTER — Ambulatory Visit (HOSPITAL_COMMUNITY)
Admission: EM | Admit: 2020-04-03 | Discharge: 2020-04-03 | Disposition: A | Discharge status: Home / Self Care | Payer: Medicare HMO | Attending: Family Medicine | Admitting: Family Medicine

## 2020-04-03 ENCOUNTER — Encounter (HOSPITAL_COMMUNITY): Payer: Self-pay | Admitting: Emergency Medicine

## 2020-04-03 DIAGNOSIS — M791 Myalgia, unspecified site: Secondary | ICD-10-CM | POA: Diagnosis not present

## 2020-04-03 MED ORDER — CYCLOBENZAPRINE HCL 10 MG PO TABS
10.0000 mg | ORAL_TABLET | Freq: Two times a day (BID) | ORAL | 0 refills | Status: DC | PRN
Start: 2020-04-03 — End: 2021-01-03

## 2020-04-03 NOTE — ED Triage Notes (Signed)
Pt here for right sided abd pain; pt was seen in ED last night for same and had CT and blood work was told was muscle spasm; pt not improved

## 2020-04-03 NOTE — ED Provider Notes (Signed)
[place]    CSN: [id number] Arrival date & time: 04/03/20  1204      History   Chief Complaint Chief Complaint  Patient presents with  . Abdominal Pain    HPI Melissa Washington is a 34 y.o. female.   Pt is a 34 year old female presenting with lower abdominal pain, nausea, and back pain. Denies fever, chills, vomiting/diarrhea. Aspirin and heat mildly alleviate symptoms. Seen in ED for same yesterday, negative work-up.  Discharged with diagnosis of musculoskeletal pain.  No vaginal discharge, itching or irritation.  Patient has IUD.  No dysuria, hematuria or urinary frequency.  UA, CT renal study and blood work unremarkable in emergency department yesterday.  ROS per HPI      Past Medical History:  Diagnosis Date  . 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 Bacteremia associated with IV line ([place]) 05/01/2013   Port-a-cath infection, removed 8/6 by IR. Ecoli grew out in cultures. Sent home with Ceftriaxone to complete 14 day course.   . Bacteremia due to Escherichia coli 05/01/2013   Port-a-cath infection, removed 8/6 by IR. Ecoli grew out in cultures. Sent home with Ceftriaxone to complete 14 day course.   . Cholelithiasis 01/17/2012  . Collagen vascular disease ([place])   . Diabetes mellitus    type 2, insulin  . Headache(784.0)   . Hepatic steatosis 01/17/2012  . Hypertension   . Leukemia, acute monocytic, in remission ([place])   . Nephrolithiasis   . Obesity     Patient Active Problem List   Diagnosis Date Noted  . Pyelonephritis 01/10/2020  . Trochanteric bursitis, right hip 09/28/2018  . Trochanteric bursitis, left hip 09/28/2018  . Acute pyelonephritis 04/07/2017  . Vaginitis and vulvovaginitis 10/09/2014  . Alopecia 06/26/2014  . Bilateral hip pain 06/19/2014  . Anxiety associated with depression 06/19/2014  . Allergic rhinitis 06/07/2014  . IUD complication ([place]) 14/43/1540  . Exposure to STD 06/07/2014  . Acute promyelocytic  leukemia in remission ([place]) 11/19/2012  . Morbid obesity with BMI of 50.0-59.9, adult ([place]) 01/20/2012  . DM (diabetes mellitus), type 2, uncontrolled ([place]) 12/30/2006  . HYPERTENSION, BENIGN ESSENTIAL 12/30/2006    Past Surgical History:  Procedure Laterality Date  . CHOLECYSTECTOMY  01/31/2012   Procedure: LAPAROSCOPIC CHOLECYSTECTOMY WITH INTRAOPERATIVE CHOLANGIOGRAM;  Surgeon: Marcello Moores A. Cornett, MD;  Location: [facility];  Service: General;  Laterality: N/A;  . PORTACATH PLACEMENT     [place]  . portacath removal      OB History    Gravida  2   Para  2   Term  2   Preterm  0   AB  0   Living  2     SAB  0   TAB  0   Ectopic  0   Multiple  0   Live Births  2            Home Medications    Prior to Admission medications   Medication Sig Start Date End Date Taking? Authorizing Provider  blood glucose meter kit and supplies Dispense based on patient and insurance preference. Use up to four times daily as directed. (FOR ICD-9 250.00, 250.01). 04/11/17   Dessa Phi, DO  cyclobenzaprine (FLEXERIL) 10 MG tablet Take 1 tablet (10 mg total) by mouth 2 (two) times daily as needed for muscle spasms. 04/03/20   Loura Halt A, NP  ferrous sulfate 325 (65 FE) MG tablet Take 1 tablet (325 mg total) by mouth daily with  breakfast. Patient not taking: Reported on 04/02/2020 01/15/20   Regalado, Jerald Kief A, MD  Insulin Glargine (LANTUS) 100 UNIT/ML Solostar Pen Inject 30 Units into the skin daily at 10 pm. Patient taking differently: Inject 32 Units into the skin daily at 10 pm.  04/11/17 09/18/23  Dessa Phi, DO  Insulin Pen Needle 31G X 5 MM MISC Use with insulin, 4 times daily 04/11/17   Dessa Phi, DO  levonorgestrel (MIRENA) 20 MCG/24HR IUD 1 each by Intrauterine route continuous.    [provider]  lidocaine (LIDODERM) 5 % Place 1 patch onto the skin daily. Remove & Discard patch within 12 hours or as directed by MD 04/02/20   Alfredia Client, PA-C  lisinopril  (ZESTRIL) 10 MG tablet Take 1 tablet by mouth daily. 02/17/20   [provider]  metFORMIN (GLUCOPHAGE-XR) 500 MG 24 hr tablet Take 500 mg by mouth 2 (two) times daily. 11/10/19   [provider]  naproxen (NAPROSYN) 375 MG tablet Take 1 tablet (375 mg total) by mouth 2 (two) times daily. 04/02/20   Patel, Shalyn, PA-C  NOVOLOG FLEXPEN 100 UNIT/ML FlexPen Inject 3-20 Units into the skin in the morning, at noon, and at bedtime. 11/10/19   [provider]  vitamin B-12 100 MCG tablet Take 1 tablet (100 mcg total) by mouth daily. 01/14/20   Regalado, Cassie Freer, MD    Family History Family History  Problem Relation Age of Onset  . Kidney disease Mother   . Hypertension Mother   . Epilepsy Mother   . Sleep apnea Mother   . Diabetes Father   . Kidney disease Maternal Grandmother   . Diabetes Maternal Grandmother     Social History Social History   Tobacco Use  . Smoking status: Former Research scientist (life sciences)  . Smokeless tobacco: Never Used  Vaping Use  . Vaping Use: Never used  Substance Use Topics  . Alcohol use: No  . Drug use: No     Allergies   Ultram [tramadol hcl]   Review of Systems Review of Systems  Constitutional: Negative.   Respiratory: Negative.   Cardiovascular: Negative.   Gastrointestinal: Positive for abdominal pain and nausea. Negative for constipation, diarrhea and vomiting.       Lower abd pain  Genitourinary: Positive for pelvic pain. Negative for dysuria, flank pain, frequency, hematuria, vaginal bleeding, vaginal discharge and vaginal pain.  Musculoskeletal: Positive for back pain. Negative for neck pain.  Skin: Negative.   Neurological: Negative.   Psychiatric/Behavioral: Negative.      Physical Exam Triage Vital Signs ED Triage Vitals  Enc Vitals Group     BP 04/03/20 1238 (!) 137/92     Pulse Rate 04/03/20 1238 85     Resp 04/03/20 1238 18     Temp 04/03/20 1238 98.5 F (36.9 C)     Temp Source 04/03/20 1238 Oral     SpO2 04/03/20  1238 99 %     Weight --      Height --      Head Circumference --      Peak Flow --      Pain Score 04/03/20 1239 8     Pain Loc --      Pain Edu? --      Excl. in [place]? --    No data found.  Updated Vital Signs BP (!) 137/92 (BP Location: Right Arm)   Pulse 85   Temp 98.5 F (36.9 C) (Oral)   Resp 18   SpO2  99%   Visual Acuity Right Eye Distance:   Left Eye Distance:   Bilateral Distance:    Right Eye Near:   Left Eye Near:    Bilateral Near:     Physical Exam Constitutional:      Appearance: She is obese.  Abdominal:     General: Abdomen is flat. Bowel sounds are normal. There is no distension. There are no signs of injury.     Palpations: Abdomen is soft.     Tenderness: There is no abdominal tenderness. There is no right CVA tenderness, left CVA tenderness, guarding or rebound.  Skin:    General: Skin is warm and dry.     Capillary Refill: Capillary refill takes less than 2 seconds.  Neurological:     General: No focal deficit present.     Mental Status: She is alert.  Psychiatric:        Mood and Affect: Mood normal.      UC Treatments / Results  Labs (all labs ordered are listed, but only abnormal results are displayed) Labs Reviewed - No data to display  EKG   Radiology CT Renal Stone Study  Result Date: 04/02/2020 CLINICAL DATA:  Right flank and groin pain since this morning. EXAM: CT ABDOMEN AND PELVIS WITHOUT CONTRAST TECHNIQUE: Multidetector CT imaging of the abdomen and pelvis was performed following the standard protocol without IV contrast. COMPARISON:  CT scan 01/10/2020 FINDINGS: Lower chest: Insert lung bases Hepatobiliary: Diffuse fatty infiltration of the liver but no focal hepatic lesions. The gallbladder is surgically absent. No biliary dilatation. Pancreas: No mass, inflammation or ductal dilatation. Spleen: Normal size. No focal lesions. Adrenals/Urinary Tract: Adrenal glands are normal. No renal or obstructing ureteral calculi are  identified. No bladder calculi. No worrisome renal or bladder lesions are identified without contrast. Stomach/Bowel: The stomach, duodenum, small bowel and colon are grossly normal without oral contrast. No inflammatory changes, mass lesions or obstructive findings. The appendix is normal. Vascular/Lymphatic: The aorta is normal in caliber. No atheroscerlotic calcifications. No mesenteric of retroperitoneal mass or adenopathy. Small scattered lymph nodes are noted. Reproductive: The uterus and ovaries are unremarkable. An IUD is noted in the endometrial canal. Other: No pelvic mass or adenopathy. No free pelvic fluid collections. No inguinal mass or adenopathy. No abdominal wall hernia or subcutaneous lesions. Musculoskeletal: No significant bony findings. IMPRESSION: 1. No acute abdominal/pelvic findings, mass lesions or adenopathy. 2. No renal, obstructing ureteral calculi or bladder calculi. 3. Diffuse fatty infiltration of the liver. 4. Status post cholecystectomy. No biliary dilatation. Electronically Signed   By: Marijo Sanes M.D.   On: 04/02/2020 12:56    Procedures Procedures (including critical care time)  Medications Ordered in UC Medications - No data to display  Initial Impression / Assessment and Plan / UC Course  I have reviewed the triage vital signs and the nursing notes.  Pertinent labs & imaging results that were available during my care of the patient were reviewed by me and considered in my medical decision making (see chart for details).     Muscle pain Patient had completely unremarkable work-up in the ED yesterday. No concerns for pyonephritis or nephrolithiasis this time. We will have her continue naproxen for pain and do Flexeril for muscle relaxant. Follow up as needed for continued or worsening symptoms  Final Clinical Impressions(s) / UC Diagnoses   Final diagnoses:  Muscle pain     Discharge Instructions     The Flexeril as prescribed.  This is for  muscle  spasming.  You can do naproxen for pain Follow up as needed for continued or worsening symptoms     ED Prescriptions    Medication Sig Dispense Auth. Provider   cyclobenzaprine (FLEXERIL) 10 MG tablet Take 1 tablet (10 mg total) by mouth 2 (two) times daily as needed for muscle spasms. 20 tablet Loura Halt A, NP     PDMP not reviewed this encounter.   Loura Halt A, NP 04/04/20 1051

## 2020-04-03 NOTE — Discharge Instructions (Addendum)
The Flexeril as prescribed.  This is for muscle spasming.  You can do naproxen for pain Follow up as needed for continued or worsening symptoms

## 2020-06-19 ENCOUNTER — Ambulatory Visit: Payer: Medicare HMO | Admitting: Orthopaedic Surgery

## 2020-06-20 ENCOUNTER — Ambulatory Visit: Payer: Medicare HMO | Admitting: Physician Assistant

## 2020-08-11 ENCOUNTER — Other Ambulatory Visit: Payer: Self-pay

## 2020-08-11 ENCOUNTER — Encounter (HOSPITAL_COMMUNITY): Payer: Self-pay

## 2020-08-11 ENCOUNTER — Ambulatory Visit (HOSPITAL_COMMUNITY)
Admission: EM | Admit: 2020-08-11 | Discharge: 2020-08-11 | Disposition: A | Discharge status: Home / Self Care | Payer: Medicare HMO | Attending: Urgent Care | Admitting: Urgent Care

## 2020-08-11 DIAGNOSIS — M549 Dorsalgia, unspecified: Secondary | ICD-10-CM | POA: Diagnosis present

## 2020-08-11 DIAGNOSIS — B3731 Acute candidiasis of vulva and vagina: Secondary | ICD-10-CM

## 2020-08-11 DIAGNOSIS — Z79899 Other long term (current) drug therapy: Secondary | ICD-10-CM | POA: Insufficient documentation

## 2020-08-11 DIAGNOSIS — N898 Other specified noninflammatory disorders of vagina: Secondary | ICD-10-CM | POA: Diagnosis not present

## 2020-08-11 DIAGNOSIS — Z87891 Personal history of nicotine dependence: Secondary | ICD-10-CM | POA: Diagnosis not present

## 2020-08-11 DIAGNOSIS — M542 Cervicalgia: Secondary | ICD-10-CM

## 2020-08-11 DIAGNOSIS — B373 Candidiasis of vulva and vagina: Secondary | ICD-10-CM | POA: Diagnosis not present

## 2020-08-11 DIAGNOSIS — E1165 Type 2 diabetes mellitus with hyperglycemia: Secondary | ICD-10-CM | POA: Diagnosis not present

## 2020-08-11 DIAGNOSIS — Z794 Long term (current) use of insulin: Secondary | ICD-10-CM | POA: Diagnosis not present

## 2020-08-11 MED ORDER — TIZANIDINE HCL 4 MG PO TABS: 4.0000 mg | ORAL_TABLET | Freq: Three times a day (TID) | ORAL | 0 refills | Status: DC | PRN

## 2020-08-11 MED ORDER — FLUCONAZOLE 150 MG PO TABS
150.0000 mg | ORAL_TABLET | ORAL | 0 refills | Status: DC
Start: 2020-08-11 — End: 2021-01-03

## 2020-08-11 MED ORDER — NAPROXEN 500 MG PO TABS
500.0000 mg | ORAL_TABLET | Freq: Two times a day (BID) | ORAL | 0 refills | Status: DC
Start: 2020-08-11 — End: 2021-01-03
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
## 2020-08-12 LAB — CERVICOVAGINAL ANCILLARY ONLY
Bacterial Vaginitis (gardnerella): POSITIVE — AB
Candida Glabrata: NEGATIVE
Candida Vaginitis: POSITIVE — AB
Chlamydia: NEGATIVE
Comment: NEGATIVE
Comment: NEGATIVE
Comment: NEGATIVE
Comment: NEGATIVE
Comment: NEGATIVE
Comment: NORMAL
Neisseria Gonorrhea: NEGATIVE
Trichomonas: NEGATIVE

## 2020-08-13 ENCOUNTER — Telehealth (HOSPITAL_COMMUNITY): Payer: Self-pay | Admitting: Emergency Medicine

## 2020-08-13 MED ORDER — METRONIDAZOLE 500 MG PO TABS
500.0000 mg | ORAL_TABLET | Freq: Two times a day (BID) | ORAL | 0 refills | Status: DC
Start: 2020-08-13 — End: 2021-01-03

## 2021-01-03 ENCOUNTER — Encounter (HOSPITAL_COMMUNITY): Payer: Self-pay | Admitting: Emergency Medicine

## 2021-01-03 ENCOUNTER — Ambulatory Visit (HOSPITAL_COMMUNITY)
Admission: EM | Admit: 2021-01-03 | Discharge: 2021-01-03 | Disposition: A | Discharge status: Home / Self Care | Payer: Medicare HMO | Attending: Urgent Care | Admitting: Urgent Care

## 2021-01-03 ENCOUNTER — Other Ambulatory Visit: Payer: Self-pay

## 2021-01-03 ENCOUNTER — Ambulatory Visit (INDEPENDENT_AMBULATORY_CARE_PROVIDER_SITE_OTHER): Payer: Medicare HMO

## 2021-01-03 DIAGNOSIS — Z793 Long term (current) use of hormonal contraceptives: Secondary | ICD-10-CM | POA: Diagnosis not present

## 2021-01-03 DIAGNOSIS — Z79899 Other long term (current) drug therapy: Secondary | ICD-10-CM | POA: Diagnosis not present

## 2021-01-03 DIAGNOSIS — R059 Cough, unspecified: Secondary | ICD-10-CM

## 2021-01-03 DIAGNOSIS — J3489 Other specified disorders of nose and nasal sinuses: Secondary | ICD-10-CM | POA: Diagnosis not present

## 2021-01-03 DIAGNOSIS — Z794 Long term (current) use of insulin: Secondary | ICD-10-CM | POA: Insufficient documentation

## 2021-01-03 DIAGNOSIS — Z87891 Personal history of nicotine dependence: Secondary | ICD-10-CM | POA: Insufficient documentation

## 2021-01-03 DIAGNOSIS — R509 Fever, unspecified: Secondary | ICD-10-CM | POA: Diagnosis not present

## 2021-01-03 DIAGNOSIS — J101 Influenza due to other identified influenza virus with other respiratory manifestations: Secondary | ICD-10-CM

## 2021-01-03 DIAGNOSIS — J1000 Influenza due to other identified influenza virus with unspecified type of pneumonia: Secondary | ICD-10-CM | POA: Diagnosis present

## 2021-01-03 DIAGNOSIS — E119 Type 2 diabetes mellitus without complications: Secondary | ICD-10-CM | POA: Diagnosis not present

## 2021-01-03 DIAGNOSIS — Z20822 Contact with and (suspected) exposure to covid-19: Secondary | ICD-10-CM | POA: Insufficient documentation

## 2021-01-03 DIAGNOSIS — R519 Headache, unspecified: Secondary | ICD-10-CM

## 2021-01-03 LAB — POC INFLUENZA A AND B ANTIGEN (URGENT CARE ONLY)
Influenza A Ag: POSITIVE — AB
Influenza B Ag: NEGATIVE

## 2021-01-03 MED ORDER — PROMETHAZINE-DM 6.25-15 MG/5ML PO SYRP: 5.0000 mL | ORAL_SOLUTION | Freq: Every evening | ORAL | 0 refills | Status: DC | PRN

## 2021-01-03 MED ORDER — ACETAMINOPHEN 325 MG PO TABS
ORAL_TABLET | ORAL | Status: AC
  Filled 2021-01-03: qty 2

## 2021-01-03 MED ORDER — IBUPROFEN 800 MG PO TABS
ORAL_TABLET | ORAL | Status: AC
  Filled 2021-01-03: qty 1

## 2021-01-03 MED ORDER — CEFDINIR 300 MG PO CAPS: 300.0000 mg | ORAL_CAPSULE | Freq: Two times a day (BID) | ORAL | 0 refills | Status: DC

## 2021-01-03 MED ORDER — IBUPROFEN 800 MG PO TABS
800.0000 mg | ORAL_TABLET | Freq: Once | ORAL | Status: AC
  Administered 2021-01-03: 800 mg via ORAL

## 2021-01-03 MED ORDER — AZITHROMYCIN 250 MG PO TABS: ORAL_TABLET | ORAL | 0 refills | Status: DC

## 2021-01-03 MED ORDER — OSELTAMIVIR PHOSPHATE 75 MG PO CAPS: 75.0000 mg | ORAL_CAPSULE | Freq: Two times a day (BID) | ORAL | 0 refills | Status: DC

## 2021-01-03 MED ORDER — ACETAMINOPHEN 325 MG PO TABS
650.0000 mg | ORAL_TABLET | Freq: Once | ORAL | Status: AC
  Administered 2021-01-03: 650 mg via ORAL

## 2021-01-03 NOTE — ED Triage Notes (Signed)
Pt is present with c/o sinus pressure, fever, slight cough, and HA. Pt states that her sx started yesterday but became worst today

## 2021-01-03 NOTE — ED Provider Notes (Signed)
[place]   MRN: [id number] DOB: 04/13/1986  Subjective:   Melissa Washington is a 35 y.o. female presenting for 1 day history of acute onset sinus pressure, frontal headache with photophobia, sinus congestion, mild occasional cough.  Patient states that her symptoms started mildly yesterday but have become dramatically worse today.  Has not taken medications for her symptoms.  She does have a history of type 2 diabetes, allergic rhinitis.  No current facility-administered medications for this encounter.  Current Outpatient Medications:  .  blood glucose meter kit and supplies, Dispense based on patient and insurance preference. Use up to four times daily as directed. (FOR ICD-9 250.00, 250.01)., Disp: 1 each, Rfl: 0 .  cyclobenzaprine (FLEXERIL) 10 MG tablet, Take 1 tablet (10 mg total) by mouth 2 (two) times daily as needed for muscle spasms., Disp: 20 tablet, Rfl: 0 .  ferrous sulfate 325 (65 FE) MG tablet, Take 1 tablet (325 mg total) by mouth daily with breakfast. (Patient not taking: No sig reported), Disp: 30 tablet, Rfl: 1 .  fluconazole (DIFLUCAN) 150 MG tablet, Take 1 tablet (150 mg total) by mouth once a week., Disp: 2 tablet, Rfl: 0 .  Insulin Glargine (LANTUS) 100 UNIT/ML Solostar Pen, Inject 30 Units into the skin daily at 10 pm. (Patient taking differently: Inject 32 Units into the skin daily at 10 pm. ), Disp: 9 mL, Rfl: 0 .  Insulin Pen Needle 31G X 5 MM MISC, Use with insulin, 4 times daily, Disp: 120 each, Rfl: 0 .  levonorgestrel (MIRENA) 20 MCG/24HR IUD, 1 each by Intrauterine route continuous., Disp: , Rfl:  .  lidocaine (LIDODERM) 5 %, Place 1 patch onto the skin daily. Remove & Discard patch within 12 hours or as directed by MD, Disp: 30 patch, Rfl: 0 .  lisinopril (ZESTRIL) 10 MG tablet, Take 1 tablet by mouth daily., Disp: , Rfl:  .  metFORMIN (GLUCOPHAGE-XR) 500 MG 24 hr tablet, Take 500 mg by mouth 2 (two) times daily., Disp: , Rfl:  .   metroNIDAZOLE (FLAGYL) 500 MG tablet, Take 1 tablet (500 mg total) by mouth 2 (two) times daily., Disp: 14 tablet, Rfl: 0 .  naproxen (NAPROSYN) 500 MG tablet, Take 1 tablet (500 mg total) by mouth 2 (two) times daily with a meal., Disp: 30 tablet, Rfl: 0 .  NOVOLOG FLEXPEN 100 UNIT/ML FlexPen, Inject 3-20 Units into the skin in the morning, at noon, and at bedtime., Disp: , Rfl:  .  tiZANidine (ZANAFLEX) 4 MG tablet, Take 1 tablet (4 mg total) by mouth every 8 (eight) hours as needed., Disp: 30 tablet, Rfl: 0 .  vitamin B-12 100 MCG tablet, Take 1 tablet (100 mcg total) by mouth daily., Disp: 30 tablet, Rfl: 0   Allergies  Allergen Reactions  . Ultram [Tramadol Hcl] Nausea And Vomiting    Past Medical History:  Diagnosis Date  . 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 Bacteremia associated with IV line ([place]) 05/01/2013   Port-a-cath infection, removed 8/6 by IR. Ecoli grew out in cultures. Sent home with Ceftriaxone to complete 14 day course.   . Bacteremia due to Escherichia coli 05/01/2013   Port-a-cath infection, removed 8/6 by IR. Ecoli grew out in cultures. Sent home with Ceftriaxone to complete 14 day course.   . Cholelithiasis 01/17/2012  . Collagen vascular disease ([place])   . Diabetes mellitus    type 2, insulin  . Headache(784.0)   . Hepatic  steatosis 01/17/2012  . Hypertension   . Leukemia, acute monocytic, in remission ([place])   . Nephrolithiasis   . Obesity      Past Surgical History:  Procedure Laterality Date  . CHOLECYSTECTOMY  01/31/2012   Procedure: LAPAROSCOPIC CHOLECYSTECTOMY WITH INTRAOPERATIVE CHOLANGIOGRAM;  Surgeon: Marcello Moores A. Cornett, MD;  Location: [facility];  Service: General;  Laterality: N/A;  . PORTACATH PLACEMENT     [place]  . portacath removal      Family History  Problem Relation Age of Onset  . Kidney disease Mother   . Hypertension Mother   . Epilepsy Mother   . Sleep apnea Mother   . Diabetes Father   . Kidney disease Maternal  Grandmother   . Diabetes Maternal Grandmother     Social History   Tobacco Use  . Smoking status: Former Research scientist (life sciences)  . Smokeless tobacco: Never Used  Vaping Use  . Vaping Use: Never used  Substance Use Topics  . Alcohol use: No  . Drug use: No    ROS   Objective:   Vitals: BP 133/86 (BP Location: Left Arm)   Pulse (!) 101   Temp (!) 102.7 F (39.3 C) (Oral)   Resp 19   SpO2 97%   Temp recheck was 102.29F s/p Tylenol after 30 minutes.   Physical Exam Constitutional:      General: She is not in acute distress.    Appearance: Normal appearance. She is well-developed. She is obese. She is not ill-appearing, toxic-appearing or diaphoretic.  HENT:     Head: Normocephalic and atraumatic.     Right Ear: Tympanic membrane, ear canal and external ear normal. There is no impacted cerumen.     Left Ear: Tympanic membrane, ear canal and external ear normal. There is no impacted cerumen.     Nose: Congestion present. No rhinorrhea.     Mouth/Throat:     Mouth: Mucous membranes are moist.     Pharynx: Oropharynx is clear. No oropharyngeal exudate or posterior oropharyngeal erythema.  Eyes:     General: No scleral icterus.       Right eye: No discharge.        Left eye: No discharge.     Extraocular Movements: Extraocular movements intact.     Conjunctiva/sclera: Conjunctivae normal.     Pupils: Pupils are equal, round, and reactive to light.  Cardiovascular:     Rate and Rhythm: Normal rate and regular rhythm.     Pulses: Normal pulses.     Heart sounds: Normal heart sounds. No murmur heard. No friction rub. No gallop.   Pulmonary:     Effort: Pulmonary effort is normal. No respiratory distress.     Breath sounds: No stridor. No wheezing, rhonchi or rales.     Comments: Decreased lung sounds. Skin:    General: Skin is warm and dry.     Findings: No rash.  Neurological:     Mental Status: She is alert and oriented to person, place, and time.     Cranial Nerves: No cranial  nerve deficit.     Motor: No weakness.     Coordination: Coordination normal.     Gait: Gait normal.     Deep Tendon Reflexes: Reflexes normal.  Psychiatric:        Mood and Affect: Mood normal.        Behavior: Behavior normal.        Thought Content: Thought content normal.        Judgment: Judgment  normal.     DG Chest 2 View  Result Date: 01/03/2021 CLINICAL DATA:  Cough and fever. EXAM: CHEST - 2 VIEW COMPARISON:  01/10/2020 FINDINGS: Heart size is normal. There is patchy infiltrate in the RIGHT lung base, consistent with early infectious process. No frank consolidations. No evidence for pulmonary edema. Visualized osseous structures have a normal appearance. Surgical clips are present in the RIGHT UPPER QUADRANT the abdomen. IMPRESSION: Suspect early RIGHT lower lobe infiltrate. Electronically Signed   By: Nolon Nations M.D.   On: 01/03/2021 18:22    Results for orders placed or performed during the hospital encounter of 01/03/21 (from the past 24 hour(s))  POC Influenza A & B Ag (Urgent Care)     Status: Abnormal   Collection Time: 01/03/21  6:06 PM  Result Value Ref Range   Influenza A Ag POSITIVE (A) NEGATIVE   Influenza B Ag NEGATIVE NEGATIVE    Assessment and Plan :   PDMP not reviewed this encounter.  1. Influenza A   2. Fever, unspecified   3. Sinus pressure   4. Cough   5. Acute nonintractable headache, unspecified headache type   6. Diabetes mellitus type 2, insulin dependent ([place])     Patient has influenza A and a right lower lobe pneumonia.  We will have her start Tamiflu, azithromycin and cefdinir to address them both.  Her temperature ultimately came down to 102.11F with Tylenol and ibuprofen.  Emphasized scheduling both of these medications to help with her fever.  Maintain strict ER precautions.  Use supportive care otherwise. Counseled patient on potential for adverse effects with medications prescribed today, patient verbalized understanding.     Jaynee Eagles, [place] 01/03/21 1858

## 2021-01-04 LAB — SARS CORONAVIRUS 2 (TAT 6-24 HRS): SARS Coronavirus 2: NEGATIVE

## 2021-01-17 ENCOUNTER — Ambulatory Visit: Payer: Medicare HMO | Admitting: Family Medicine

## 2021-02-22 ENCOUNTER — Encounter (HOSPITAL_COMMUNITY): Payer: Self-pay | Admitting: Physician Assistant

## 2021-02-22 ENCOUNTER — Ambulatory Visit (HOSPITAL_COMMUNITY)
Admission: EM | Admit: 2021-02-22 | Discharge: 2021-02-22 | Disposition: A | Discharge status: Home / Self Care | Payer: Medicare HMO | Attending: Physician Assistant | Admitting: Physician Assistant

## 2021-02-22 DIAGNOSIS — M25552 Pain in left hip: Secondary | ICD-10-CM | POA: Insufficient documentation

## 2021-02-22 DIAGNOSIS — R110 Nausea: Secondary | ICD-10-CM | POA: Diagnosis present

## 2021-02-22 DIAGNOSIS — M25512 Pain in left shoulder: Secondary | ICD-10-CM | POA: Diagnosis present

## 2021-02-22 DIAGNOSIS — R197 Diarrhea, unspecified: Secondary | ICD-10-CM | POA: Diagnosis present

## 2021-02-22 DIAGNOSIS — N898 Other specified noninflammatory disorders of vagina: Secondary | ICD-10-CM | POA: Diagnosis present

## 2021-02-22 DIAGNOSIS — Z113 Encounter for screening for infections with a predominantly sexual mode of transmission: Secondary | ICD-10-CM | POA: Insufficient documentation

## 2021-02-22 DIAGNOSIS — B373 Candidiasis of vulva and vagina: Secondary | ICD-10-CM | POA: Insufficient documentation

## 2021-02-22 DIAGNOSIS — B3731 Acute candidiasis of vulva and vagina: Secondary | ICD-10-CM

## 2021-02-22 MED ORDER — ONDANSETRON 4 MG PO TBDP: 4.0000 mg | ORAL_TABLET | Freq: Three times a day (TID) | ORAL | 0 refills | Status: DC | PRN

## 2021-02-22 MED ORDER — NAPROXEN 375 MG PO TABS: 375.0000 mg | ORAL_TABLET | Freq: Two times a day (BID) | ORAL | 0 refills | Status: DC

## 2021-02-22 MED ORDER — FLUCONAZOLE 150 MG PO TABS: 150.0000 mg | ORAL_TABLET | ORAL | 0 refills | Status: AC

## 2021-02-22 MED ORDER — BACLOFEN 10 MG PO TABS: 10.0000 mg | ORAL_TABLET | Freq: Two times a day (BID) | ORAL | 0 refills | Status: DC | PRN

## 2021-02-22 NOTE — ED Triage Notes (Addendum)
Pt present left arm and leg pain with diarrhea. Symptoms started two -three day ago. Pt states that her arm and leg pain is not associated with an injury but she does do a lot of lifting and walking at work.  Pt also having issue with vaginal discharge and itching .

## 2021-02-22 NOTE — Discharge Instructions (Signed)
Take Diflucan once every 3 days for total of 2 doses to treat yeast infection.  We will contact you if we need to arrange any additional treatment.  Wear loosefitting cotton underwear and use hypoallergenic soaps and detergents.  I have called in Zofran to help with nausea symptoms.  We do not need to do stool testing at this time but if your symptoms persist for another 2 to 3 days I would recommend reevaluation to consider this.  Eat a bland diet and drink plenty of fluid.  If you have any worsening symptoms including fever, worsening diarrhea, blood in your stool, abdominal pain you need to be reevaluated immediately.  I have called in Naprosyn which is like Aleve to help with inflammation.  Please do not take additional NSAIDs with this medication including aspirin, ibuprofen/Advil, naproxen/Aleve due to risk of GI bleeding.  I have called in baclofen to be used up to twice a day to help with muscle pain.  You should not drive or drink alcohol while taking this.  Please follow-up with sports medicine as we discussed as you will likely need more advanced imaging and further evaluation.  Please follow-up with either your PCP or our clinic within 1 week to ensure improvement of symptoms.

## 2021-02-22 NOTE — ED Provider Notes (Signed)
[facility]    CSN: [id number] Arrival date & time: 02/22/21  1001      History   Chief Complaint Chief Complaint  Patient presents with  . Diarrhea  . Hip Pain  . Arm Pain  . Vaginal Itching    HPI Melissa Washington is a 35 y.o. female.   Patient presents today with several concerns.  Her primary concern today is a several week history of worsening left shoulder and left hip pain.  She denies any known injury or change in activity prior to symptom onset.  She does report symptoms worsened after she went to lift something heavy at her place of employment.  Pain is rated 10 on a 0-10 pain scale, localized to left hip and left shoulder, described as aching periodic sharp pains, worse with certain movements or positions, no alleviating factors identified.  She has tried ibuprofen without improvement of symptoms.  She does have a history of trochanteric bursitis and is followed by sports medicine provider he was provided cortisone injections with temporary relief of symptoms.  She reports last injection was several months ago.  She is having difficulty performing daily activities as result of symptoms and is requesting work excuse note today.  In addition, patient reports a 2 to 3-day history of diarrhea.  She reports diarrhea is described as 3-4 watery bowel movements per 24 hours without blood or mucus.  She does report associated nausea but denies any melena, hematochezia, vomiting, abdominal pain, fever.  She denies any known sick contacts or being around anyone with similar symptoms.  Denies any recent medication changes, antibiotic use, recent travel, history of gastrointestinal disorder, suspicious food intake.  She has not tried anything for symptom management.  She has been able to eat and drink normally despite symptoms.  In addition, patient reports is a weeklong history of vaginal irritation and discharge.  She describes this as thick white discharge with associated  pruritus.  She does have a history of diabetes and believes her blood sugars have been elevated which is often a trigger for yeast infection (has not been monitoring her blood sugars at home).  She denies any recent antibiotics.  She has not tried anything for symptom management.  She denies any pelvic pain, abdominal pain, nausea, vomiting, fever.  She has no concern for STI but is open to testing today.     Past Medical History:  Diagnosis Date  . 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 Bacteremia associated with IV line ([place]) 05/01/2013   Port-a-cath infection, removed 8/6 by IR. Ecoli grew out in cultures. Sent home with Ceftriaxone to complete 14 day course.   . Bacteremia due to Escherichia coli 05/01/2013   Port-a-cath infection, removed 8/6 by IR. Ecoli grew out in cultures. Sent home with Ceftriaxone to complete 14 day course.   . Cholelithiasis 01/17/2012  . Collagen vascular disease ([place])   . Diabetes mellitus    type 2, insulin  . Headache(784.0)   . Hepatic steatosis 01/17/2012  . Hypertension   . Leukemia, acute monocytic, in remission ([place])   . Nephrolithiasis   . Obesity     Patient Active Problem List   Diagnosis Date Noted  . Pyelonephritis 01/10/2020  . Trochanteric bursitis, right hip 09/28/2018  . Trochanteric bursitis, left hip 09/28/2018  . Acute pyelonephritis 04/07/2017  . Vaginitis and vulvovaginitis 10/09/2014  . Alopecia 06/26/2014  . Bilateral hip pain 06/19/2014  . Anxiety associated with depression 06/19/2014  .  Allergic rhinitis 06/07/2014  . IUD complication ([place]) 81/85/6314  . Exposure to STD 06/07/2014  . Acute promyelocytic leukemia in remission ([place]) 11/19/2012  . Morbid obesity with BMI of 50.0-59.9, adult ([place]) 01/20/2012  . DM (diabetes mellitus), type 2, uncontrolled ([place]) 12/30/2006  . HYPERTENSION, BENIGN ESSENTIAL 12/30/2006    Past Surgical History:  Procedure Laterality Date  . CHOLECYSTECTOMY  01/31/2012    Procedure: LAPAROSCOPIC CHOLECYSTECTOMY WITH INTRAOPERATIVE CHOLANGIOGRAM;  Surgeon: Marcello Moores A. Cornett, MD;  Location: [facility];  Service: General;  Laterality: N/A;  . PORTACATH PLACEMENT     [place]  . portacath removal      OB History    Gravida  2   Para  2   Term  2   Preterm  0   AB  0   Living  2     SAB  0   IAB  0   Ectopic  0   Multiple  0   Live Births  2            Home Medications    Prior to Admission medications   Medication Sig Start Date End Date Taking? Authorizing Provider  baclofen (LIORESAL) 10 MG tablet Take 1 tablet (10 mg total) by mouth 2 (two) times daily as needed for muscle spasms. 02/22/21  Yes Jakobi Thetford K, PA-C  fluconazole (DIFLUCAN) 150 MG tablet Take 1 tablet (150 mg total) by mouth every 3 (three) days for 2 doses. 02/22/21 02/26/21 Yes Jaxiel Kines K, PA-C  naproxen (NAPROSYN) 375 MG tablet Take 1 tablet (375 mg total) by mouth 2 (two) times daily. 02/22/21  Yes Dayton Kenley K, PA-C  ondansetron (ZOFRAN ODT) 4 MG disintegrating tablet Take 1 tablet (4 mg total) by mouth every 8 (eight) hours as needed for nausea or vomiting. 02/22/21  Yes Cason Dabney K, PA-C  blood glucose meter kit and supplies Dispense based on patient and insurance preference. Use up to four times daily as directed. (FOR ICD-9 250.00, 250.01). 04/11/17   Dessa Phi, DO  Insulin Glargine (LANTUS) 100 UNIT/ML Solostar Pen Inject 30 Units into the skin daily at 10 pm. Patient taking differently: Inject 32 Units into the skin daily at 10 pm.  04/11/17 09/18/23  Dessa Phi, DO  Insulin Pen Needle 31G X 5 MM MISC Use with insulin, 4 times daily 04/11/17   Dessa Phi, DO  levonorgestrel (MIRENA) 20 MCG/24HR IUD 1 each by Intrauterine route continuous.    [provider]  lisinopril (ZESTRIL) 10 MG tablet Take 1 tablet by mouth daily. 02/17/20   [provider]  metFORMIN (GLUCOPHAGE-XR) 500 MG 24 hr tablet Take 500 mg by mouth 2 (two) times daily. 11/10/19    [provider]  NOVOLOG FLEXPEN 100 UNIT/ML FlexPen Inject 3-20 Units into the skin in the morning, at noon, and at bedtime. 11/10/19   [provider]  ferrous sulfate 325 (65 FE) MG tablet Take 1 tablet (325 mg total) by mouth daily with breakfast. Patient not taking: No sig reported 01/15/20 01/03/21  Regalado, Cassie Freer, MD    Family History Family History  Problem Relation Age of Onset  . Kidney disease Mother   . Hypertension Mother   . Epilepsy Mother   . Sleep apnea Mother   . Diabetes Father   . Kidney disease Maternal Grandmother   . Diabetes Maternal Grandmother     Social History Social History   Tobacco Use  . Smoking status: Former Research scientist (life sciences)  . Smokeless tobacco:  Never Used  Vaping Use  . Vaping Use: Never used  Substance Use Topics  . Alcohol use: No  . Drug use: No     Allergies   Ultram [tramadol hcl]   Review of Systems Review of Systems  Constitutional: Positive for activity change. Negative for appetite change, fatigue and fever.  Respiratory: Negative for cough and shortness of breath.   Cardiovascular: Negative for chest pain.  Gastrointestinal: Positive for diarrhea and nausea. Negative for abdominal pain, constipation and vomiting.  Genitourinary: Positive for vaginal discharge. Negative for vaginal bleeding and vaginal pain.  Musculoskeletal: Positive for arthralgias. Negative for back pain and myalgias.  Neurological: Negative for dizziness, light-headedness and headaches.     Physical Exam Triage Vital Signs ED Triage Vitals [02/22/21 1017]  Enc Vitals Group     BP 130/84     Pulse Rate 89     Resp 16     Temp 98.7 F (37.1 C)     Temp Source Oral     SpO2 95 %     Weight      Height      Head Circumference      Peak Flow      Pain Score      Pain Loc      Pain Edu?      Excl. in [place]?    No data found.  Updated Vital Signs BP 130/84 (BP Location: Right Arm)   Pulse 89   Temp 98.7 F (37.1 C) (Oral)   Resp  16   SpO2 95%   Visual Acuity Right Eye Distance:   Left Eye Distance:   Bilateral Distance:    Right Eye Near:   Left Eye Near:    Bilateral Near:     Physical Exam Vitals reviewed.  Constitutional:      General: She is awake. She is not in acute distress.    Appearance: Normal appearance. She is overweight. She is not ill-appearing.     Comments: Very pleasant female appears stated age no acute distress  HENT:     Head: Normocephalic and atraumatic.  Cardiovascular:     Rate and Rhythm: Normal rate and regular rhythm.     Heart sounds: Normal heart sounds. No murmur heard.   Pulmonary:     Effort: Pulmonary effort is normal.     Breath sounds: Normal breath sounds. No wheezing, rhonchi or rales.     Comments: Clear to auscultation bilaterally Abdominal:     General: Bowel sounds are normal.     Palpations: Abdomen is soft.     Tenderness: There is no abdominal tenderness. There is no right CVA tenderness, left CVA tenderness, guarding or rebound.     Comments: Benign abdominal exam  Genitourinary:    Comments: Exam deferred Musculoskeletal:     Left shoulder: Tenderness present. No swelling or bony tenderness. Decreased range of motion. Normal strength.     Left hip: Tenderness present. No deformity or bony tenderness. Decreased range of motion. Normal strength.     Comments: Left shoulder: Tender to palpation over [facility] joint and along upper arm.  Decreased range of motion with overhead flexion and abduction.  Patient unable to perform Apley scratch test but has normal drop arm and empty can.  Strength 5/5.  No deformity noted.  Left hip: Decreased range of motion secondary to pain.  Tender to palpation over lateral left hip.  No deformity noted.  Normal gait.  Strength 5/5.  Psychiatric:  Behavior: Behavior is cooperative.      UC Treatments / Results  Labs (all labs ordered are listed, but only abnormal results are displayed) Labs Reviewed  CERVICOVAGINAL  ANCILLARY ONLY    EKG   Radiology No results found.  Procedures Procedures (including critical care time)  Medications Ordered in UC Medications - No data to display  Initial Impression / Assessment and Plan / UC Course  I have reviewed the triage vital signs and the nursing notes.  Pertinent labs & imaging results that were available during my care of the patient were reviewed by me and considered in my medical decision making (see chart for details).     No indication for x-rays given no bony tenderness on exam and atraumatic injury.  We will start NSAID (Naprosyn 375 mg twice daily) the patient was instructed not to take additional NSAIDs with this medication.  Discussed that often we will use steroids to help with this but given history of hyperglycemia in the setting of uncontrolled diabetes will defer steroids at this time.  She was prescribed baclofen to be used up to twice a day as needed.  Discussed that she should not drive or drink alcohol with this medication as drowsiness is a common side effect.  Recommended she use heat, rest, stretch for symptom management.  She is to follow-up with sports medicine as she would likely need more advanced imaging and additional interventions.  Discussed alarm symptoms that warrant emergent evaluation.  Strict return precautions given to which patient expressed understanding.  Will empirically treat for yeast infection given clinical presentation.  STI swab collected today-results pending.  We will determine if additional treatment is necessary based on laboratory findings.  Patient was encouraged to follow-up with PCP regarding hyperglycemia.  Suspect viral etiology of symptoms given short duration.  Patient was given Zofran to help manage nausea and vomiting with instruction to eat a bland diet and drink plenty of fluid.  She is only been symptomatic for a few days and does not have any alarm symptoms so we will defer stool testing at this  time.  Discussed that if symptoms persist will consider stool testing in the future.  Discussed alarm symptoms or warrant emergent evaluation including development of abdominal pain.  Strict return precautions given to which patient expressed understanding.  Requested she follow-up with either our clinic or PCP within 1 week to ensure improvement of symptoms.  Total time spent during visit was 40 minutes.  During this time we addressed multiple patient complaints, additional work-up, reviewed medical history and discussed how this relates to current complaints.  Final Clinical Impressions(s) / UC Diagnoses   Final diagnoses:  Left hip pain  Acute pain of left shoulder  Diarrhea, unspecified type  Nausea without vomiting  Yeast infection of the vagina  Vaginal discharge  Routine screening for STI (sexually transmitted infection)     Discharge Instructions     Take Diflucan once every 3 days for total of 2 doses to treat yeast infection.  We will contact you if we need to arrange any additional treatment.  Wear loosefitting cotton underwear and use hypoallergenic soaps and detergents.  I have called in Zofran to help with nausea symptoms.  We do not need to do stool testing at this time but if your symptoms persist for another 2 to 3 days I would recommend reevaluation to consider this.  Eat a bland diet and drink plenty of fluid.  If you have any worsening symptoms including  fever, worsening diarrhea, blood in your stool, abdominal pain you need to be reevaluated immediately.  I have called in Naprosyn which is like Aleve to help with inflammation.  Please do not take additional NSAIDs with this medication including aspirin, ibuprofen/Advil, naproxen/Aleve due to risk of GI bleeding.  I have called in baclofen to be used up to twice a day to help with muscle pain.  You should not drive or drink alcohol while taking this.  Please follow-up with sports medicine as we discussed as you will likely  need more advanced imaging and further evaluation.  Please follow-up with either your PCP or our clinic within 1 week to ensure improvement of symptoms.    ED Prescriptions    Medication Sig Dispense Auth. Provider   fluconazole (DIFLUCAN) 150 MG tablet Take 1 tablet (150 mg total) by mouth every 3 (three) days for 2 doses. 2 tablet Clothilde Tippetts K, PA-C   baclofen (LIORESAL) 10 MG tablet Take 1 tablet (10 mg total) by mouth 2 (two) times daily as needed for muscle spasms. 20 each Resean Brander K, PA-C   naproxen (NAPROSYN) 375 MG tablet Take 1 tablet (375 mg total) by mouth 2 (two) times daily. 20 tablet Austan Nicholl K, PA-C   ondansetron (ZOFRAN ODT) 4 MG disintegrating tablet Take 1 tablet (4 mg total) by mouth every 8 (eight) hours as needed for nausea or vomiting. 20 tablet Livio Ledwith, Derry Skill, PA-C     PDMP not reviewed this encounter.   Terrilee Croak, PA-C 02/22/21 1111

## 2021-02-24 LAB — CERVICOVAGINAL ANCILLARY ONLY
Bacterial Vaginitis (gardnerella): POSITIVE — AB
Candida Glabrata: NEGATIVE
Candida Vaginitis: POSITIVE — AB
Chlamydia: NEGATIVE
Comment: NEGATIVE
Comment: NEGATIVE
Comment: NEGATIVE
Comment: NEGATIVE
Comment: NEGATIVE
Comment: NORMAL
Neisseria Gonorrhea: NEGATIVE
Trichomonas: POSITIVE — AB

## 2021-02-26 ENCOUNTER — Telehealth (HOSPITAL_COMMUNITY): Payer: Self-pay | Admitting: Emergency Medicine

## 2021-02-26 MED ORDER — METRONIDAZOLE 500 MG PO TABS: 500.0000 mg | ORAL_TABLET | Freq: Two times a day (BID) | ORAL | 0 refills | Status: DC

## 2022-04-18 ENCOUNTER — Encounter (HOSPITAL_COMMUNITY): Payer: Self-pay

## 2022-04-18 ENCOUNTER — Ambulatory Visit (INDEPENDENT_AMBULATORY_CARE_PROVIDER_SITE_OTHER): Payer: Medicaid Other

## 2022-04-18 ENCOUNTER — Ambulatory Visit (HOSPITAL_COMMUNITY)
Admission: EM | Admit: 2022-04-18 | Discharge: 2022-04-18 | Disposition: A | Discharge status: Home / Self Care | Payer: Medicaid Other | Attending: Internal Medicine | Admitting: Internal Medicine

## 2022-04-18 DIAGNOSIS — M25511 Pain in right shoulder: Secondary | ICD-10-CM

## 2022-04-18 DIAGNOSIS — Z113 Encounter for screening for infections with a predominantly sexual mode of transmission: Secondary | ICD-10-CM | POA: Diagnosis present

## 2022-04-18 DIAGNOSIS — N898 Other specified noninflammatory disorders of vagina: Secondary | ICD-10-CM | POA: Diagnosis present

## 2022-04-18 LAB — HIV ANTIBODY (ROUTINE TESTING W REFLEX): HIV Screen 4th Generation wRfx: NONREACTIVE

## 2022-04-18 LAB — POC URINE PREG, ED: Preg Test, Ur: NEGATIVE

## 2022-04-18 MED ORDER — FLUCONAZOLE 150 MG PO TABS: 150.0000 mg | ORAL_TABLET | ORAL | 0 refills | Status: DC

## 2022-04-18 MED ORDER — CYCLOBENZAPRINE HCL 5 MG PO TABS: 5.0000 mg | ORAL_TABLET | Freq: Two times a day (BID) | ORAL | 0 refills | Status: DC | PRN

## 2022-04-18 MED ORDER — IBUPROFEN 600 MG PO TABS: 600.0000 mg | ORAL_TABLET | Freq: Four times a day (QID) | ORAL | 0 refills | Status: DC | PRN

## 2022-04-18 NOTE — ED Provider Notes (Addendum)
[place]    CSN: [id number] Arrival date & time: 04/18/22  1140      History   Chief Complaint Chief Complaint  Patient presents with   Shoulder Pain    HPI Melissa Washington is a 36 y.o. female.   Patient presents with 2 different chief complaints today.  Patient reports that she been having some right shoulder pain for the past 2 days.  She reports that it was exacerbated after doing heavy lifting at work but shoulder pain started prior to this.  Reports history of recurrent shoulder pain in that area.  Movement exacerbates pain.  She has taken Tylenol with treatment.  Denies any numbness or tingling.  Patient also reports some white clumpy vaginal discharge that started yesterday.  She reports history of recurrent vaginal yeast infections given that she has uncontrolled type 2 diabetes.  She does not check her blood sugar at home.  Last menstrual cycle is unknown given that she has an IUD and does not have regular menstrual cycles.  She would like STD testing including blood work for HIV and syphilis as well given that she has a new sexual partner.  Denies any confirmed exposure to STD. Denies any associated urinary symptoms, abdominal pain, pelvic pain, fever, back pain, abnormal vaginal bleeding.    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 Ecoli grew out in cultures. Sent home with Ceftriaxone to complete 14 day course.    Bacteremia due to Escherichia coli 05/01/2013   Port-a-cath infection, removed 8/6 by IR. Ecoli grew out in cultures. Sent home with Ceftriaxone to complete 14 day course.    Cholelithiasis 01/17/2012   Collagen vascular disease ([place])    Diabetes mellitus    type 2, insulin   Headache(784.0)    Hepatic steatosis 01/17/2012   Hypertension    Leukemia, acute monocytic, in  remission ([place])    Nephrolithiasis    Obesity     Patient Active Problem List   Diagnosis Date Noted   Pyelonephritis 01/10/2020   Trochanteric bursitis, right hip 09/28/2018   Trochanteric bursitis, left hip 09/28/2018   Acute pyelonephritis 04/07/2017   Vaginitis and vulvovaginitis 10/09/2014   Alopecia 06/26/2014   Bilateral hip pain 06/19/2014   Anxiety associated with depression 06/19/2014   Allergic rhinitis 53/29/9242   IUD complication ([place]) 68/34/1962   Exposure to STD 06/07/2014   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 Cornett, MD;  Location: [facility] OR;  Service: General;  Laterality: N/A;   PORTACATH PLACEMENT     [place]   portacath removal      OB History     Gravida  2   Para  2   Term  2   Preterm  0   AB  0   Living  2      SAB  0   IAB  0   Ectopic  0   Multiple  0   Live Births  2  Home Medications    Prior to Admission medications   Medication Sig Start Date End Date Taking? Authorizing Provider  cyclobenzaprine (FLEXERIL) 5 MG tablet Take 1 tablet (5 mg total) by mouth 2 (two) times daily as needed for muscle spasms. 04/18/22  Yes , Michele Rockers, FNP  fluconazole (DIFLUCAN) 150 MG tablet Take 1 tablet (150 mg total) by mouth every 3 (three) days. 04/18/22  Yes , Hildred Alamin E, FNP  ibuprofen (ADVIL) 600 MG tablet Take 1 tablet (600 mg total) by mouth every 6 (six) hours as needed for mild pain. 04/18/22  Yes , Michele Rockers, FNP  blood glucose meter kit and supplies Dispense based on patient and insurance preference. Use up to four times daily as directed. (FOR ICD-9  250.00, 250.01). 04/11/17   Dessa Phi, DO  Insulin Glargine (LANTUS) 100 UNIT/ML Solostar Pen Inject 30 Units into the skin daily at 10 pm. Patient taking differently: Inject 32 Units into the skin daily at 10 pm.  04/11/17 09/18/23  Dessa Phi, DO  Insulin Pen Needle 31G X 5 MM MISC Use with insulin, 4 times daily 04/11/17   Dessa Phi, DO  levonorgestrel (MIRENA) 20 MCG/24HR IUD 1 each by Intrauterine route continuous.    [provider]  lisinopril (ZESTRIL) 10 MG tablet Take 1 tablet by mouth daily. 02/17/20   [provider]  metFORMIN (GLUCOPHAGE-XR) 500 MG 24 hr tablet Take 500 mg by mouth 2 (two) times daily. 11/10/19   [provider]  metroNIDAZOLE (FLAGYL) 500 MG tablet Take 1 tablet (500 mg total) by mouth 2 (two) times daily. 02/26/21   Lamptey, Myrene Galas, MD  NOVOLOG FLEXPEN 100 UNIT/ML FlexPen Inject 3-20 Units into the skin in the morning, at noon, and at bedtime. 11/10/19   [provider]  ondansetron (ZOFRAN ODT) 4 MG disintegrating tablet Take 1 tablet (4 mg total) by mouth every 8 (eight) hours as needed for nausea or vomiting. 02/22/21   Raspet, Derry Skill, PA-C  ferrous sulfate 325 (65 FE) MG tablet Take 1 tablet (325 mg total) by mouth daily with breakfast. Patient not taking: No sig reported 01/15/20 01/03/21  Regalado, Cassie Freer, MD    Family History Family History  Problem Relation Age of Onset   Kidney disease Mother    Hypertension Mother    Epilepsy Mother    Sleep apnea Mother    Diabetes Father    Kidney disease Maternal Grandmother    Diabetes Maternal Grandmother     Social History Social History   Tobacco Use   Smoking status: Former   Smokeless tobacco: Never  Scientific laboratory technician Use: Never used  Substance Use Topics   Alcohol use: No   Drug use: No     Allergies   Ultram [tramadol hcl]   Review of Systems Review of Systems Per HPI  Physical Exam Triage Vital Signs ED Triage Vitals [04/18/22 1153]   Enc Vitals Group     BP 139/62     Pulse Rate 88     Resp 16     Temp 98.1 F (36.7 C)     Temp Source Oral     SpO2 98 %     Weight      Height      Head Circumference      Peak Flow      Pain Score 8     Pain Loc      Pain Edu?      Excl. in  GC?    No data found.  Updated Vital Signs BP 139/62 (BP Location: Left Arm)   Pulse 88   Temp 98.1 F (36.7 C) (Oral)   Resp 16   SpO2 98%   Visual Acuity Right Eye Distance:   Left Eye Distance:   Bilateral Distance:    Right Eye Near:   Left Eye Near:    Bilateral Near:     Physical Exam Constitutional:      General: She is not in acute distress.    Appearance: Normal appearance. She is not toxic-appearing or diaphoretic.  HENT:     Head: Normocephalic and atraumatic.  Eyes:     Extraocular Movements: Extraocular movements intact.     Conjunctiva/sclera: Conjunctivae normal.  Pulmonary:     Effort: Pulmonary effort is normal.  Genitourinary:    Comments: Deferred with shared decision-making.  Self swab performed. Musculoskeletal:     Comments: Tenderness to palpation throughout right shoulder.  No obvious swelling, discoloration, lacerations, abrasions, warmth noted.  Pain with abduction to 45 degrees.  Grip strength 5/5.  Neurovascular intact.  Neurological:     General: No focal deficit present.     Mental Status: She is alert and oriented to person, place, and time. Mental status is at baseline.  Psychiatric:        Mood and Affect: Mood normal.        Behavior: Behavior normal.        Thought Content: Thought content normal.        Judgment: Judgment normal.      UC Treatments / Results  Labs (all labs ordered are listed, but only abnormal results are displayed) Labs Reviewed  RPR  HIV ANTIBODY (ROUTINE TESTING W REFLEX)  POC URINE PREG, ED  CERVICOVAGINAL ANCILLARY ONLY    EKG   Radiology DG Shoulder Right  Result Date: 04/18/2022 CLINICAL DATA:  A 36 year old female presents with shoulder  pain after doing yard work. EXAM: RIGHT SHOULDER - 2+ VIEW COMPARISON:  None Available. FINDINGS: There is no evidence of fracture or dislocation. There is no evidence of arthropathy or other focal bone abnormality. Soft tissues are unremarkable. IMPRESSION: Negative. Electronically Signed   By: Zetta Bills M.D.   On: 04/18/2022 12:39    Procedures Procedures (including critical care time)  Medications Ordered in UC Medications - No data to display  Initial Impression / Assessment and Plan / UC Course  I have reviewed the triage vital signs and the nursing notes.  Pertinent labs & imaging results that were available during my care of the patient were reviewed by me and considered in my medical decision making (see chart for details).     Suspicious of vaginal yeast infection given the patient has recurrent candidiasis of the vaginal area due to hyperglycemia.  Will treat with Diflucan.  Cervicovaginal swab, HIV, RPR test pending per patient request and will await results for any further treatment.  Patient to refrain from sexual activity until test results and treatment are complete.  Will defer blood glucose given the patient has recently been seen by PCP and had blood work, glucose, A1c completed.  Patient also appears physically well.  Patient advised of the importance of monitoring blood sugar at home and that yeast infections will continue to be recurrent if blood sugar is not controlled.  Patient voiced understanding.  Right shoulder x-ray was negative for any acute bony abnormality.  Suspect muscular strain/injury.  Will treat with NSAID and muscle relaxer.  Advised patient muscle relaxer can cause drowsiness.  Discussed ice application.  Patient to follow-up if symptoms persist or worsen.  Patient verbalized understanding and was agreeable with plan. Final Clinical Impressions(s) / UC Diagnoses   Final diagnoses:  Acute pain of right shoulder  Vaginal discharge  Screening  examination for venereal disease     Discharge Instructions      Your shoulder x-ray was normal.  Suspect muscular strain/injury.  You have been prescribed anti-inflammatory and muscle relaxer to take for discomfort.  Please be advised that muscle relaxer can cause drowsiness so do not drink alcohol or drive while taking this medication.  Also recommend that you use ice application.  You have been prescribed Diflucan given suspicion of yeast infection.  Your vaginal swab and blood work are pending.  We will call if there are any abnormalities.  We ask that you refrain from sexual activity until test results and treatment are complete.  Urine pregnancy was also negative.     ED Prescriptions     Medication Sig Dispense Auth. Provider   fluconazole (DIFLUCAN) 150 MG tablet Take 1 tablet (150 mg total) by mouth every 3 (three) days. 2 tablet [place], [place] E, [place]   ibuprofen (ADVIL) 600 MG tablet Take 1 tablet (600 mg total) by mouth every 6 (six) hours as needed for mild pain. 30 tablet [place], [place] E, [place]   cyclobenzaprine (FLEXERIL) 5 MG tablet Take 1 tablet (5 mg total) by mouth 2 (two) times daily as needed for muscle spasms. 20 tablet [place], Michele Rockers, [place]      PDMP not reviewed this encounter.   Teodora Medici, [place] 04/18/22 [place], [place], [place] 04/18/22 1254

## 2022-04-18 NOTE — ED Triage Notes (Signed)
Pt states right shoulder pain for the past 2 days after doing heavy lifting at work.  Also states vaginal discharge since yesterday states she would like to be checked for STDS.

## 2022-04-18 NOTE — Discharge Instructions (Signed)
Your shoulder x-ray was normal.  Suspect muscular strain/injury.  You have been prescribed anti-inflammatory and muscle relaxer to take for discomfort.  Please be advised that muscle relaxer can cause drowsiness so do not drink alcohol or drive while taking this medication.  Also recommend that you use ice application.  You have been prescribed Diflucan given suspicion of yeast infection.  Your vaginal swab and blood work are pending.  We will call if there are any abnormalities.  We ask that you refrain from sexual activity until test results and treatment are complete.  Urine pregnancy was also negative.

## 2022-04-19 LAB — RPR: RPR Ser Ql: NONREACTIVE

## 2022-04-20 LAB — CERVICOVAGINAL ANCILLARY ONLY
Bacterial Vaginitis (gardnerella): POSITIVE — AB
Candida Glabrata: NEGATIVE
Candida Vaginitis: POSITIVE — AB
Chlamydia: NEGATIVE
Comment: NEGATIVE
Comment: NEGATIVE
Comment: NEGATIVE
Comment: NEGATIVE
Comment: NEGATIVE
Comment: NORMAL
Neisseria Gonorrhea: NEGATIVE
Trichomonas: POSITIVE — AB

## 2022-04-21 ENCOUNTER — Telehealth (HOSPITAL_COMMUNITY): Payer: Self-pay | Admitting: Emergency Medicine

## 2022-04-21 MED ORDER — METRONIDAZOLE 500 MG PO TABS: 500.0000 mg | ORAL_TABLET | Freq: Two times a day (BID) | ORAL | 0 refills | Status: DC

## 2022-08-17 ENCOUNTER — Encounter (HOSPITAL_COMMUNITY): Payer: Self-pay | Admitting: *Deleted

## 2022-08-17 ENCOUNTER — Ambulatory Visit (HOSPITAL_COMMUNITY)
Admission: EM | Admit: 2022-08-17 | Discharge: 2022-08-17 | Disposition: A | Discharge status: Home / Self Care | Payer: Medicaid Other | Attending: Physician Assistant | Admitting: Physician Assistant

## 2022-08-17 ENCOUNTER — Ambulatory Visit (INDEPENDENT_AMBULATORY_CARE_PROVIDER_SITE_OTHER): Payer: Medicaid Other

## 2022-08-17 DIAGNOSIS — M546 Pain in thoracic spine: Secondary | ICD-10-CM | POA: Diagnosis not present

## 2022-08-17 DIAGNOSIS — M542 Cervicalgia: Secondary | ICD-10-CM

## 2022-08-17 DIAGNOSIS — S161XXA Strain of muscle, fascia and tendon at neck level, initial encounter: Secondary | ICD-10-CM

## 2022-08-17 DIAGNOSIS — M545 Low back pain, unspecified: Secondary | ICD-10-CM | POA: Diagnosis not present

## 2022-08-17 MED ORDER — IBUPROFEN 600 MG PO TABS: 600.0000 mg | ORAL_TABLET | Freq: Four times a day (QID) | ORAL | 0 refills | Status: DC | PRN

## 2022-08-17 MED ORDER — METHOCARBAMOL 500 MG PO TABS: 500.0000 mg | ORAL_TABLET | Freq: Three times a day (TID) | ORAL | 0 refills | Status: DC | PRN
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
## 2022-08-17 NOTE — Discharge Instructions (Signed)
Your x-rays were normal with no evidence of a fracture which is great news.  I suspect that you have strained the muscles.  They will likely continue to be sore for the next several days.  Use heat, rest, stretch for additional symptom relief.  Start ibuprofen every 6-8 hours as needed.  Do not take additional NSAIDs with this medication.  You can use Tylenol/acetaminophen.  Take Robaxin up to 3 times a day.  This will make you sleepy do not drive or drink alcohol while taking it.  Make sure you rest and drink plenty of fluid.  Follow-up with sports medicine if her symptoms are improving quickly as he would likely benefit from seeing a physical therapist which they can help arrange.  If anything changes or worsens you need to be seen immediately.

## 2022-09-02 ENCOUNTER — Other Ambulatory Visit: Payer: Self-pay

## 2022-09-02 ENCOUNTER — Encounter (HOSPITAL_COMMUNITY): Payer: Self-pay

## 2022-09-02 ENCOUNTER — Emergency Department (HOSPITAL_COMMUNITY): Payer: Medicaid Other

## 2022-09-02 ENCOUNTER — Inpatient Hospital Stay (HOSPITAL_COMMUNITY)
Admission: EM | Admit: 2022-09-02 | Discharge: 2022-09-06 | DRG: 153 | Disposition: A | Discharge status: Home / Self Care | Payer: Medicaid Other | Attending: Internal Medicine | Admitting: Internal Medicine

## 2022-09-02 DIAGNOSIS — Z820 Family history of epilepsy and other diseases of the nervous system: Secondary | ICD-10-CM

## 2022-09-02 DIAGNOSIS — K760 Fatty (change of) liver, not elsewhere classified: Secondary | ICD-10-CM | POA: Diagnosis present

## 2022-09-02 DIAGNOSIS — Z794 Long term (current) use of insulin: Secondary | ICD-10-CM

## 2022-09-02 DIAGNOSIS — Z833 Family history of diabetes mellitus: Secondary | ICD-10-CM

## 2022-09-02 DIAGNOSIS — E1165 Type 2 diabetes mellitus with hyperglycemia: Secondary | ICD-10-CM

## 2022-09-02 DIAGNOSIS — Z8739 Personal history of other diseases of the musculoskeletal system and connective tissue: Secondary | ICD-10-CM

## 2022-09-02 DIAGNOSIS — Z841 Family history of disorders of kidney and ureter: Secondary | ICD-10-CM

## 2022-09-02 DIAGNOSIS — Z975 Presence of (intrauterine) contraceptive device: Secondary | ICD-10-CM

## 2022-09-02 DIAGNOSIS — S0921XA Traumatic rupture of right ear drum, initial encounter: Secondary | ICD-10-CM | POA: Diagnosis present

## 2022-09-02 DIAGNOSIS — H70001 Acute mastoiditis without complications, right ear: Principal | ICD-10-CM | POA: Diagnosis present

## 2022-09-02 DIAGNOSIS — Y9289 Other specified places as the place of occurrence of the external cause: Secondary | ICD-10-CM

## 2022-09-02 DIAGNOSIS — Z836 Family history of other diseases of the respiratory system: Secondary | ICD-10-CM

## 2022-09-02 DIAGNOSIS — Z87442 Personal history of urinary calculi: Secondary | ICD-10-CM

## 2022-09-02 DIAGNOSIS — Y9315 Activity, underwater diving and snorkeling: Secondary | ICD-10-CM

## 2022-09-02 DIAGNOSIS — Z8249 Family history of ischemic heart disease and other diseases of the circulatory system: Secondary | ICD-10-CM

## 2022-09-02 DIAGNOSIS — H7091 Unspecified mastoiditis, right ear: Principal | ICD-10-CM

## 2022-09-02 DIAGNOSIS — Z6841 Body Mass Index (BMI) 40.0 and over, adult: Secondary | ICD-10-CM

## 2022-09-02 DIAGNOSIS — Z8719 Personal history of other diseases of the digestive system: Secondary | ICD-10-CM

## 2022-09-02 DIAGNOSIS — Z7984 Long term (current) use of oral hypoglycemic drugs: Secondary | ICD-10-CM

## 2022-09-02 DIAGNOSIS — Z9049 Acquired absence of other specified parts of digestive tract: Secondary | ICD-10-CM

## 2022-09-02 DIAGNOSIS — I10 Essential (primary) hypertension: Secondary | ICD-10-CM | POA: Diagnosis present

## 2022-09-02 DIAGNOSIS — Z885 Allergy status to narcotic agent status: Secondary | ICD-10-CM

## 2022-09-02 DIAGNOSIS — Z79899 Other long term (current) drug therapy: Secondary | ICD-10-CM

## 2022-09-02 DIAGNOSIS — Z8619 Personal history of other infectious and parasitic diseases: Secondary | ICD-10-CM

## 2022-09-02 DIAGNOSIS — H6091 Unspecified otitis externa, right ear: Secondary | ICD-10-CM | POA: Diagnosis present

## 2022-09-02 DIAGNOSIS — Z87891 Personal history of nicotine dependence: Secondary | ICD-10-CM

## 2022-09-02 DIAGNOSIS — C9241 Acute promyelocytic leukemia, in remission: Secondary | ICD-10-CM | POA: Diagnosis present

## 2022-09-02 LAB — CBC
HCT: 37.6 % (ref 36.0–46.0)
Hemoglobin: 12.6 g/dL (ref 12.0–15.0)
MCH: 29.4 pg (ref 26.0–34.0)
MCHC: 33.5 g/dL (ref 30.0–36.0)
MCV: 87.6 fL (ref 80.0–100.0)
Platelets: 282 10*3/uL (ref 150–400)
RBC: 4.29 MIL/uL (ref 3.87–5.11)
RDW: 12.5 % (ref 11.5–15.5)
WBC: 11.4 10*3/uL — ABNORMAL HIGH (ref 4.0–10.5)
nRBC: 0 % (ref 0.0–0.2)

## 2022-09-02 LAB — BASIC METABOLIC PANEL
Anion gap: 10 (ref 5–15)
BUN: 20 mg/dL (ref 6–20)
CO2: 26 mmol/L (ref 22–32)
Calcium: 9.2 mg/dL (ref 8.9–10.3)
Chloride: 98 mmol/L (ref 98–111)
Creatinine, Ser: 0.94 mg/dL (ref 0.44–1.00)
GFR, Estimated: >60 mL/min (ref >60)
Glucose, Bld: 387 mg/dL — ABNORMAL HIGH (ref 70–99)
Potassium: 3.9 mmol/L (ref 3.5–5.1)
Sodium: 134 mmol/L — ABNORMAL LOW (ref 135–145)

## 2022-09-02 MED ORDER — IOHEXOL 300 MG/ML  SOLN
100.0000 mL | Freq: Once | INTRAMUSCULAR | Status: AC | PRN
  Administered 2022-09-02: 100 mL via INTRAVENOUS
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
## 2022-09-03 DIAGNOSIS — K760 Fatty (change of) liver, not elsewhere classified: Secondary | ICD-10-CM | POA: Diagnosis present

## 2022-09-03 DIAGNOSIS — C9241 Acute promyelocytic leukemia, in remission: Secondary | ICD-10-CM | POA: Diagnosis present

## 2022-09-03 DIAGNOSIS — Y9289 Other specified places as the place of occurrence of the external cause: Secondary | ICD-10-CM | POA: Diagnosis not present

## 2022-09-03 DIAGNOSIS — Z836 Family history of other diseases of the respiratory system: Secondary | ICD-10-CM | POA: Diagnosis not present

## 2022-09-03 DIAGNOSIS — Z8249 Family history of ischemic heart disease and other diseases of the circulatory system: Secondary | ICD-10-CM | POA: Diagnosis not present

## 2022-09-03 DIAGNOSIS — I10 Essential (primary) hypertension: Secondary | ICD-10-CM | POA: Diagnosis present

## 2022-09-03 DIAGNOSIS — Z9049 Acquired absence of other specified parts of digestive tract: Secondary | ICD-10-CM | POA: Diagnosis not present

## 2022-09-03 DIAGNOSIS — H70001 Acute mastoiditis without complications, right ear: Secondary | ICD-10-CM | POA: Diagnosis not present

## 2022-09-03 DIAGNOSIS — Z8719 Personal history of other diseases of the digestive system: Secondary | ICD-10-CM | POA: Diagnosis not present

## 2022-09-03 DIAGNOSIS — Y9315 Activity, underwater diving and snorkeling: Secondary | ICD-10-CM | POA: Diagnosis not present

## 2022-09-03 DIAGNOSIS — S0921XA Traumatic rupture of right ear drum, initial encounter: Secondary | ICD-10-CM | POA: Diagnosis present

## 2022-09-03 DIAGNOSIS — Z8619 Personal history of other infectious and parasitic diseases: Secondary | ICD-10-CM | POA: Diagnosis not present

## 2022-09-03 DIAGNOSIS — Z820 Family history of epilepsy and other diseases of the nervous system: Secondary | ICD-10-CM | POA: Diagnosis not present

## 2022-09-03 DIAGNOSIS — Z7984 Long term (current) use of oral hypoglycemic drugs: Secondary | ICD-10-CM | POA: Diagnosis not present

## 2022-09-03 DIAGNOSIS — Z8739 Personal history of other diseases of the musculoskeletal system and connective tissue: Secondary | ICD-10-CM | POA: Diagnosis not present

## 2022-09-03 DIAGNOSIS — Z794 Long term (current) use of insulin: Secondary | ICD-10-CM | POA: Diagnosis not present

## 2022-09-03 DIAGNOSIS — Z833 Family history of diabetes mellitus: Secondary | ICD-10-CM | POA: Diagnosis not present

## 2022-09-03 DIAGNOSIS — Z975 Presence of (intrauterine) contraceptive device: Secondary | ICD-10-CM | POA: Diagnosis not present

## 2022-09-03 DIAGNOSIS — Z87891 Personal history of nicotine dependence: Secondary | ICD-10-CM | POA: Diagnosis not present

## 2022-09-03 DIAGNOSIS — Z6841 Body Mass Index (BMI) 40.0 and over, adult: Secondary | ICD-10-CM | POA: Diagnosis not present

## 2022-09-03 DIAGNOSIS — Z841 Family history of disorders of kidney and ureter: Secondary | ICD-10-CM | POA: Diagnosis not present

## 2022-09-03 DIAGNOSIS — H7091 Unspecified mastoiditis, right ear: Secondary | ICD-10-CM | POA: Diagnosis not present

## 2022-09-03 DIAGNOSIS — Z87442 Personal history of urinary calculi: Secondary | ICD-10-CM | POA: Diagnosis not present

## 2022-09-03 DIAGNOSIS — Z79899 Other long term (current) drug therapy: Secondary | ICD-10-CM | POA: Diagnosis not present

## 2022-09-03 DIAGNOSIS — E1165 Type 2 diabetes mellitus with hyperglycemia: Secondary | ICD-10-CM | POA: Diagnosis present

## 2022-09-03 DIAGNOSIS — H6091 Unspecified otitis externa, right ear: Secondary | ICD-10-CM | POA: Diagnosis present

## 2022-09-03 LAB — GLUCOSE, CAPILLARY
Glucose-Capillary: 256 mg/dL — ABNORMAL HIGH (ref 70–99)
Glucose-Capillary: 218 mg/dL — ABNORMAL HIGH (ref 70–99)

## 2022-09-03 LAB — CBC
HCT: 31.5 % — ABNORMAL LOW (ref 36.0–46.0)
Hemoglobin: 10.6 g/dL — ABNORMAL LOW (ref 12.0–15.0)
MCH: 29.3 pg (ref 26.0–34.0)
MCHC: 33.7 g/dL (ref 30.0–36.0)
MCV: 87 fL (ref 80.0–100.0)
Platelets: 225 10*3/uL (ref 150–400)
RBC: 3.62 MIL/uL — ABNORMAL LOW (ref 3.87–5.11)
RDW: 12.3 % (ref 11.5–15.5)
WBC: 9.5 10*3/uL (ref 4.0–10.5)
nRBC: 0 % (ref 0.0–0.2)

## 2022-09-03 LAB — CREATININE, SERUM
Creatinine, Ser: 0.57 mg/dL (ref 0.44–1.00)
GFR, Estimated: >60 mL/min (ref >60)

## 2022-09-03 LAB — HEMOGLOBIN A1C
Hgb A1c MFr Bld: 9.8 % — ABNORMAL HIGH (ref 4.8–5.6)
Mean Plasma Glucose: 235 mg/dL

## 2022-09-03 LAB — CBG MONITORING, ED: Glucose-Capillary: 364 mg/dL — ABNORMAL HIGH (ref 70–99)

## 2022-09-03 MED ORDER — OXYCODONE HCL 5 MG PO TABS
5.0000 mg | ORAL_TABLET | ORAL | Status: DC | PRN
  Administered 2022-09-03 – 2022-09-06 (×6): 5 mg via ORAL
  Filled 2022-09-03 (×7): qty 1

## 2022-09-03 MED ORDER — INSULIN ASPART 100 UNIT/ML IJ SOLN
0.0000 [IU] | Freq: Every day | INTRAMUSCULAR | Status: DC
  Filled 2022-09-03: qty 0.05

## 2022-09-03 MED ORDER — ONDANSETRON HCL 4 MG/2ML IJ SOLN
4.0000 mg | Freq: Four times a day (QID) | INTRAMUSCULAR | Status: DC | PRN
  Administered 2022-09-03: 4 mg via INTRAVENOUS
  Filled 2022-09-03 (×2): qty 2

## 2022-09-03 MED ORDER — SODIUM CHLORIDE 0.9% FLUSH
3.0000 mL | Freq: Two times a day (BID) | INTRAVENOUS | Status: DC
  Administered 2022-09-03 – 2022-09-06 (×7): 3 mL via INTRAVENOUS

## 2022-09-03 MED ORDER — HYDROMORPHONE HCL 1 MG/ML IJ SOLN
1.0000 mg | Freq: Once | INTRAMUSCULAR | Status: AC
  Administered 2022-09-03: 1 mg via INTRAVENOUS
  Filled 2022-09-03: qty 1

## 2022-09-03 MED ORDER — INSULIN ASPART 100 UNIT/ML IJ SOLN
0.0000 [IU] | Freq: Every day | INTRAMUSCULAR | Status: DC
  Administered 2022-09-03 – 2022-09-04 (×2): 2 [IU] via SUBCUTANEOUS

## 2022-09-03 MED ORDER — ONDANSETRON HCL 4 MG/2ML IJ SOLN
4.0000 mg | Freq: Once | INTRAMUSCULAR | Status: AC
  Administered 2022-09-03: 4 mg via INTRAVENOUS
  Filled 2022-09-03: qty 2

## 2022-09-03 MED ORDER — METFORMIN HCL ER 500 MG PO TB24: 500.0000 mg | ORAL_TABLET | Freq: Two times a day (BID) | ORAL | Status: DC

## 2022-09-03 MED ORDER — DOXYCYCLINE HYCLATE 100 MG PO TABS
100.0000 mg | ORAL_TABLET | Freq: Two times a day (BID) | ORAL | Status: DC
  Administered 2022-09-03 – 2022-09-06 (×6): 100 mg via ORAL
  Filled 2022-09-03 (×7): qty 1

## 2022-09-03 MED ORDER — INSULIN GLARGINE-YFGN 100 UNIT/ML ~~LOC~~ SOLN
32.0000 [IU] | Freq: Every day | SUBCUTANEOUS | Status: DC
  Administered 2022-09-04: 32 [IU] via SUBCUTANEOUS
  Filled 2022-09-03: qty 0.32

## 2022-09-03 MED ORDER — ACETAMINOPHEN 650 MG RE SUPP: 650.0000 mg | Freq: Four times a day (QID) | RECTAL | Status: DC | PRN

## 2022-09-03 MED ORDER — INSULIN GLARGINE-YFGN 100 UNIT/ML ~~LOC~~ SOLN
32.0000 [IU] | Freq: Every day | SUBCUTANEOUS | Status: DC
  Administered 2022-09-03: 32 [IU] via SUBCUTANEOUS
  Filled 2022-09-03: qty 0.32

## 2022-09-03 MED ORDER — KETOROLAC TROMETHAMINE 15 MG/ML IJ SOLN: 15.0000 mg | Freq: Four times a day (QID) | INTRAMUSCULAR | Status: DC | PRN

## 2022-09-03 MED ORDER — KETOROLAC TROMETHAMINE 15 MG/ML IJ SOLN
15.0000 mg | Freq: Once | INTRAMUSCULAR | Status: AC
  Administered 2022-09-03: 15 mg via INTRAVENOUS
  Filled 2022-09-03: qty 1

## 2022-09-03 MED ORDER — DOCUSATE SODIUM 100 MG PO CAPS
100.0000 mg | ORAL_CAPSULE | Freq: Two times a day (BID) | ORAL | Status: DC
  Administered 2022-09-03 – 2022-09-06 (×6): 100 mg via ORAL
  Filled 2022-09-03 (×6): qty 1

## 2022-09-03 MED ORDER — ENOXAPARIN SODIUM 60 MG/0.6ML IJ SOSY
60.0000 mg | PREFILLED_SYRINGE | INTRAMUSCULAR | Status: DC
  Administered 2022-09-03 – 2022-09-05 (×3): 60 mg via SUBCUTANEOUS
  Filled 2022-09-03 (×3): qty 0.6

## 2022-09-03 MED ORDER — KETOROLAC TROMETHAMINE 30 MG/ML IJ SOLN
30.0000 mg | Freq: Four times a day (QID) | INTRAMUSCULAR | Status: DC
  Administered 2022-09-03 – 2022-09-06 (×12): 30 mg via INTRAVENOUS
  Filled 2022-09-03 (×12): qty 1

## 2022-09-03 MED ORDER — ONDANSETRON HCL 4 MG PO TABS: 4.0000 mg | ORAL_TABLET | Freq: Four times a day (QID) | ORAL | Status: DC | PRN

## 2022-09-03 MED ORDER — INSULIN ASPART 100 UNIT/ML IJ SOLN
0.0000 [IU] | Freq: Three times a day (TID) | INTRAMUSCULAR | Status: DC
  Administered 2022-09-03: 11 [IU] via SUBCUTANEOUS
  Administered 2022-09-04: 7 [IU] via SUBCUTANEOUS
  Administered 2022-09-04: 4 [IU] via SUBCUTANEOUS
  Administered 2022-09-04 – 2022-09-05 (×2): 7 [IU] via SUBCUTANEOUS
  Administered 2022-09-05 – 2022-09-06 (×4): 4 [IU] via SUBCUTANEOUS

## 2022-09-03 MED ORDER — CIPROFLOXACIN IN D5W 400 MG/200ML IV SOLN
400.0000 mg | Freq: Once | INTRAVENOUS | Status: AC
  Administered 2022-09-03: 400 mg via INTRAVENOUS
  Filled 2022-09-03: qty 200

## 2022-09-03 MED ORDER — CIPROFLOXACIN-DEXAMETHASONE 0.3-0.1 % OT SUSP
4.0000 [drp] | Freq: Two times a day (BID) | OTIC | Status: DC
  Administered 2022-09-03 – 2022-09-06 (×8): 4 [drp] via OTIC
  Filled 2022-09-03 (×2): qty 7.5

## 2022-09-03 MED ORDER — METHOCARBAMOL 500 MG PO TABS: 500.0000 mg | ORAL_TABLET | Freq: Three times a day (TID) | ORAL | Status: DC | PRN

## 2022-09-03 MED ORDER — ACETAMINOPHEN 325 MG PO TABS: 650.0000 mg | ORAL_TABLET | Freq: Four times a day (QID) | ORAL | Status: DC | PRN

## 2022-09-03 MED ORDER — CIPROFLOXACIN IN D5W 400 MG/200ML IV SOLN
400.0000 mg | Freq: Two times a day (BID) | INTRAVENOUS | Status: DC
  Administered 2022-09-03 – 2022-09-04 (×3): 400 mg via INTRAVENOUS
  Filled 2022-09-03 (×3): qty 200

## 2022-09-03 MED ORDER — SODIUM CHLORIDE 0.9 % IV BOLUS
1000.0000 mL | Freq: Once | INTRAVENOUS | Status: AC
  Administered 2022-09-03: 1000 mL via INTRAVENOUS

## 2022-09-03 MED ORDER — OXYCODONE HCL 5 MG PO TABS: 5.0000 mg | ORAL_TABLET | Freq: Four times a day (QID) | ORAL | Status: DC | PRN

## 2022-09-03 MED ORDER — LISINOPRIL 10 MG PO TABS
10.0000 mg | ORAL_TABLET | Freq: Every day | ORAL | Status: DC
  Administered 2022-09-03 – 2022-09-06 (×4): 10 mg via ORAL
  Filled 2022-09-03 (×4): qty 1

## 2022-09-03 MED ORDER — HYDROMORPHONE HCL 1 MG/ML IJ SOLN
1.0000 mg | INTRAMUSCULAR | Status: AC | PRN
  Administered 2022-09-03 – 2022-09-05 (×3): 1 mg via INTRAVENOUS
  Filled 2022-09-03 (×3): qty 1

## 2022-09-03 MED ORDER — INSULIN ASPART 100 UNIT/ML IJ SOLN
0.0000 [IU] | Freq: Three times a day (TID) | INTRAMUSCULAR | Status: DC
  Administered 2022-09-03: 15 [IU] via SUBCUTANEOUS
  Filled 2022-09-03: qty 0.15

## 2022-09-03 NOTE — H&P (Signed)
History and Physical    Patient: Melissa Washington JJH:[id number] DOB: 26-Feb-1986 DOA: 09/02/2022 DOS: the patient was seen and examined on 09/03/2022 PCP: [facility]  Patient coming from: Home  Chief Complaint:  Chief Complaint  Patient presents with   Ear Pain   HPI: Melissa Washington is a 36 y.o. female with medical history significant of diabetes mellitus 2 on insulin, morbid obesity, hypertension and history of acute promyelocytic leukemia in remission.  Presented to the ER with unresolved ear pain.  She apparently had traveled to the [place] and went scuba diving and developed pain while in the [place].  Sought medical attention there and was told that her right eardrum had been perforated.  Since returning she has developed worsening pain right-sided facial swelling and pus draining from the ear.  The ER she has been afebrile, she had a low-grade white count of 11,400, pressure has been modestly elevated in the context of pain.  She has been not hypoxic.  The scan of the temporal bones revealed soft tissue thickening of the right external auditory canal and partial opacification of the right middle ear cavity and right mastoid consistent with otomastoiditis and otitis externa.  No infiltration into the left temporal bone and no abscess or drainable fluid collection.  Patient has been admitted to the hospital for administration of IV antibiotics and pain control.  Review of Systems: As mentioned in the history of present illness. All other systems reviewed and are negative. Past Medical History:  Diagnosis Date   Acute promyelocytic leukemia    in remission 04-24-12   Bacteremia associated with IV line ([place]) 05/01/2013   Port-a-cath infection, removed 8/6 by IR. Ecoli grew out in cultures. Sent home with Ceftriaxone to complete 14 day course.    Bacteremia due to Escherichia coli 05/01/2013   Port-a-cath infection, removed 8/6 by IR. Ecoli grew  out in cultures. Sent home with Ceftriaxone to complete 14 day course.    Cholelithiasis 01/17/2012   Collagen vascular disease ([place])    Diabetes mellitus    type 2, insulin   Headache(784.0)    Hepatic steatosis 01/17/2012   Hypertension    Leukemia, acute monocytic, in remission ([place])    Nephrolithiasis    Obesity    Past Surgical History:  Procedure Laterality Date   CHOLECYSTECTOMY  01/31/2012   Procedure: LAPAROSCOPIC CHOLECYSTECTOMY WITH INTRAOPERATIVE CHOLANGIOGRAM;  Surgeon: Joyice Faster. Cornett, MD;  Location: [facility];  Service: General;  Laterality: N/A;   PORTACATH PLACEMENT     [place]   portacath removal     Social History:  reports that she has quit smoking. She has never used smokeless tobacco. She reports that she does not drink alcohol and does not use drugs.  Allergies  Allergen Reactions   Ultram [Tramadol Hcl] Nausea And Vomiting    Family History  Problem Relation Age of Onset   Kidney disease Mother    Hypertension Mother    Epilepsy Mother    Sleep apnea Mother    Diabetes Father    Kidney disease Maternal Grandmother    Diabetes Maternal Grandmother     Prior to Admission medications   Medication Sig Start Date End Date Taking? Authorizing Provider  blood glucose meter kit and supplies Dispense based on patient and insurance preference. Use up to four times daily as directed. (FOR ICD-9 250.00, 250.01). 04/11/17   Dessa Phi, DO  fluconazole (DIFLUCAN) 150 MG tablet Take 1 tablet (150 mg total)  by mouth every 3 (three) days. 04/18/22   Teodora Medici, FNP  ibuprofen (ADVIL) 600 MG tablet Take 1 tablet (600 mg total) by mouth every 6 (six) hours as needed for mild pain. 08/17/22   Raspet, Derry Skill, PA-C  Insulin Glargine (LANTUS) 100 UNIT/ML Solostar Pen Inject 30 Units into the skin daily at 10 pm. Patient taking differently: Inject 32 Units into the skin daily at 10 pm.  04/11/17 09/18/23  Dessa Phi, DO  Insulin Pen Needle 31G X 5 MM MISC Use with  insulin, 4 times daily 04/11/17   Dessa Phi, DO  levonorgestrel (MIRENA) 20 MCG/24HR IUD 1 each by Intrauterine route continuous.    [provider]  lisinopril (ZESTRIL) 10 MG tablet Take 1 tablet by mouth daily. 02/17/20   [provider]  metFORMIN (GLUCOPHAGE-XR) 500 MG 24 hr tablet Take 500 mg by mouth 2 (two) times daily. 11/10/19   [provider]  methocarbamol (ROBAXIN) 500 MG tablet Take 1 tablet (500 mg total) by mouth every 8 (eight) hours as needed for muscle spasms. 08/17/22   Raspet, Derry Skill, PA-C  metroNIDAZOLE (FLAGYL) 500 MG tablet Take 1 tablet (500 mg total) by mouth 2 (two) times daily. 04/21/22   Lamptey, Myrene Galas, MD  NOVOLOG FLEXPEN 100 UNIT/ML FlexPen Inject 3-20 Units into the skin in the morning, at noon, and at bedtime. 11/10/19   [provider]  ondansetron (ZOFRAN ODT) 4 MG disintegrating tablet Take 1 tablet (4 mg total) by mouth every 8 (eight) hours as needed for nausea or vomiting. 02/22/21   Raspet, Derry Skill, PA-C  ferrous sulfate 325 (65 FE) MG tablet Take 1 tablet (325 mg total) by mouth daily with breakfast. Patient not taking: No sig reported 01/15/20 01/03/21  Niel Hummer A, MD    Physical Exam: Vitals:   09/03/22 0615 09/03/22 0621 09/03/22 0900 09/03/22 1006  BP: 134/80  (!) 134/93   Pulse: 97  84   Resp: 18  18   Temp:  98.3 F (36.8 C)  (!) 97.5 F (36.4 C)  TempSrc:  Oral  Oral  SpO2: 92%  96%   Weight:      Height:       Constitutional: NAD, calm, comfortable Eyes: PERRL, lids and conjunctivae normal ENMT: Right face just above the cheekbone extending to the ear is swollen and very tender to even minimal palpation.  No observable purulence from right ear at this time. Neck: normal, supple, no masses, no thyromegaly Respiratory: clear to auscultation bilaterally, no wheezing, no crackles. Normal respiratory effort. No accessory muscle use.  Cardiovascular: Regular rate and rhythm, no murmurs / rubs /  gallops. No extremity edema. 2+ pedal pulses. No carotid bruits.  Abdomen: no tenderness, no masses palpated. No hepatosplenomegaly. Bowel sounds positive.  Musculoskeletal: no clubbing / cyanosis. No joint deformity upper and lower extremities. Good ROM, no contractures. Normal muscle tone.  Skin: no rashes, lesions, ulcers. No induration Neurologic: CN 2-12 grossly intact. Sensation intact, DTR normal. Strength 5/5 x all 4 extremities.  Psychiatric: Normal judgment and insight. Alert and oriented x 3. Normal mood.   Data Reviewed:  Chane 134, potassium 3.9, chloride 98, CO2 26, glucose 387, BUN 20, creatinine 0.94, white count 11,400 differential was not obtained, hemoglobin 12.6, platelets 282,000.  Assessment and Plan: Right mastoiditis Formally been seen by ENT who has clarified diagnosis is right otitis externa Times apparently developed after swimming/scuba diving and apparently she developed a perforated TM Has recommended discharge  once pain is better and recommends Pseudomonas coverage with ciprofloxacin oral as well as Ciprodex drops Which has been placed and can come out in 5 days but likely this will come out on its own Strict water precautions in right ear Follow-up with ENT if symptoms not been 3 to 4 days or become worse or the wick remains in place.  Otherwise she is to follow-up in 2 to 3 weeks to check for possible perforation once inflammatory changes have resolved Will continue IV Cipro for now and transition to oral Cipro once ready for discharge Has been given IV Dilaudid but states think she can now tolerate oral diet so have transitioned her to IV Toradol and oral Oxy IR Continue preadmission Robaxin  Diabetes mellitus 2 with hyperglycemia on chronic insulin CBG up to 387 with mild pseudohyponatremia-probably secondary to acute infection Follow CBGs and provide SSI Continue long-acting insulin at 32 units/day-Semglee substituted for home Lantus Metformin since  patient did receive a contrasted study/her CT scan Check hemoglobin A1c  Hypertension Mildly elevated in context of significant pain Continue Zestril  History of acute promyelocytic leukemia in remission Patient was diagnosed in March 2013 and was treated with ATRA and arsenic Evaluation at [facility] in 2014 revealed no reemergence of disease   Advance Care Planning:   Code Status: Prior full code  DVT prophylaxis: Lovenox  Consults: ENT  Family Communication: Patient only  Severity of Illness: The appropriate patient status for this patient is INPATIENT. Inpatient status is judged to be reasonable and necessary in order to provide the required intensity of service to ensure the patient's safety. The patient's presenting symptoms, physical exam findings, and initial radiographic and laboratory data in the context of their chronic comorbidities is felt to place them at high risk for further clinical deterioration. Furthermore, it is not anticipated that the patient will be medically stable for discharge from the hospital within 2 midnights of admission.   * I certify that at the point of admission it is my clinical judgment that the patient will require inpatient hospital care spanning beyond 2 midnights from the point of admission due to high intensity of service, high risk for further deterioration and high frequency of surveillance required.*  Author: Erin Hearing, NP 09/03/2022 10:56 AM  For on call review [email or web address].

## 2022-09-03 NOTE — Consult Note (Signed)
Reason for Consult:otits right Referring Physician: Dr Fransisca Connors is an 36 y.o. female.  HPI: hx of swimming in last week and was told she had perforation of TM in [place]. She has had some drainage. When she got back yesterday the pain increased abd she came to ER. Ct scan show partial mastoid opacification with no erosion. She has no facial nerve weakness. A wick was placed. She is better from yesterday  Past Medical History:  Diagnosis Date   Acute promyelocytic leukemia    in remission 04-24-12   Bacteremia associated with IV line ([place]) 05/01/2013   Port-a-cath infection, removed 8/6 by IR. Ecoli grew out in cultures. Sent home with Ceftriaxone to complete 14 day course.    Bacteremia due to Escherichia coli 05/01/2013   Port-a-cath infection, removed 8/6 by IR. Ecoli grew out in cultures. Sent home with Ceftriaxone to complete 14 day course.    Cholelithiasis 01/17/2012   Collagen vascular disease ([place])    Diabetes mellitus    type 2, insulin   Headache(784.0)    Hepatic steatosis 01/17/2012   Hypertension    Leukemia, acute monocytic, in remission ([place])    Nephrolithiasis    Obesity     Past Surgical History:  Procedure Laterality Date   CHOLECYSTECTOMY  01/31/2012   Procedure: LAPAROSCOPIC CHOLECYSTECTOMY WITH INTRAOPERATIVE CHOLANGIOGRAM;  Surgeon: Joyice Faster. Cornett, MD;  Location: [facility];  Service: General;  Laterality: N/A;   PORTACATH PLACEMENT     [place]   portacath removal      Family History  Problem Relation Age of Onset   Kidney disease Mother    Hypertension Mother    Epilepsy Mother    Sleep apnea Mother    Diabetes Father    Kidney disease Maternal Grandmother    Diabetes Maternal Grandmother     Social History:  reports that she has quit smoking. She has never used smokeless tobacco. She reports that she does not drink alcohol and does not use drugs.  Allergies:  Allergies  Allergen Reactions   Ultram [Tramadol Hcl] Nausea And Vomiting     Medications: I have reviewed the patient's current medications.  Results for orders placed or performed during the hospital encounter of 09/02/22 (from the past 48 hour(s))  CBC     Status: Abnormal   Collection Time: 09/02/22  9:44 PM  Result Value Ref Range   WBC 11.4 (H) 4.0 - 10.5 K/uL   RBC 4.29 3.87 - 5.11 MIL/uL   Hemoglobin 12.6 12.0 - 15.0 g/dL   HCT 37.6 36.0 - 46.0 %   MCV 87.6 80.0 - 100.0 fL   MCH 29.4 26.0 - 34.0 pg   MCHC 33.5 30.0 - 36.0 g/dL   RDW 12.5 11.5 - 15.5 %   Platelets 282 150 - 400 K/uL   nRBC 0.0 0.0 - 0.2 %    Comment: Performed at [facility], [place] [address], [place], [place] [address]  Basic metabolic panel     Status: Abnormal   Collection Time: 09/02/22  9:44 PM  Result Value Ref Range   Sodium 134 (L) 135 - 145 mmol/L   Potassium 3.9 3.5 - 5.1 mmol/L   Chloride 98 98 - 111 mmol/L   CO2 26 22 - 32 mmol/L   Glucose, Bld 387 (H) 70 - 99 mg/dL    Comment: Glucose reference range applies only to samples taken after fasting for at least 8 hours.   BUN 20 6 -  20 mg/dL   Creatinine, Ser 0.94 0.44 - 1.00 mg/dL   Calcium 9.2 8.9 - 10.3 mg/dL   GFR, Estimated >60 >60 mL/min    Comment: (NOTE) Calculated using the CKD-EPI Creatinine Equation (2021)    Anion gap 10 5 - 15    Comment: Performed at [facility], [place] [address], [place], [place] [address]    CT Temporal Bones W Contrast  Result Date: 09/03/2022 CLINICAL DATA:  Right facial swelling EXAM: CT TEMPORAL BONES WITH CONTRAST TECHNIQUE: Axial and coronal plane CT imaging of the petrous temporal bones was performed with thin-collimation image reconstruction following intravenous contrast administration. Multiplanar CT image reconstructions were also generated. RADIATION DOSE REDUCTION: This exam was performed according to the departmental dose-optimization program which includes automated exposure control, adjustment of the mA and/or kV according to  patient size and/or use of iterative reconstruction technique. CONTRAST:  138m OMNIPAQUE IOHEXOL 300 MG/ML  SOLN COMPARISON:  None Available. FINDINGS: RIGHT TEMPORAL BONE External auditory canal: Diffuse soft tissue thickening Middle ear cavity: Partial opacification. The ossicles are intact. Scutum is sharp. Inner ear structures: The cochlea, vestibule and semicircular canals are normal. The vestibular aqueduct is not enlarged. Internal auditory and facial nerve canals:  Normal Mastoid air cells: Moderate amount of fluid in the right mastoid LEFT TEMPORAL BONE External auditory canal: Normal. Middle ear cavity: Normally aerated. The scutum and ossicles are normal. The tegmen tympani is intact. Inner ear structures: The cochlea, vestibule and semicircular canals are normal. The vestibular aqueduct is not enlarged. Internal auditory and facial nerve canals:  Normal. Mastoid air cells: Normally aerated. No osseous erosion. Vascular: Normal non-contrast appearance of the carotid canals, jugular bulbs and sigmoid plates. Limited intracranial:  No acute or significant finding. Visible orbits/paranasal sinuses: No acute or significant finding. IMPRESSION: 1. Diffuse soft tissue thickening of the right external auditory canal and partial opacification of the right middle ear cavity and right mastoid, consistent with otomastoiditis and otitis externa. 2. Normal left temporal bone. 3. No abscess or drainable fluid collection. Electronically Signed   By: KUlyses JarredM.D.   On: 09/03/2022 00:46    ROS Blood pressure (!) 134/93, pulse 84, temperature (!) 97.5 F (36.4 C), temperature source Oral, resp. rate 18, height '5\' 2"'$  (1.575 m), weight 120.2 kg, SpO2 96 %. Physical Exam HENT:     Head: Normocephalic.     Left Ear: Tympanic membrane normal.     Ears:     Comments: Right ear has some drainage from the canal and there is wick in place. No significant pinna swelling and VII intact    Nose: Nose normal.      Mouth/Throat:     Mouth: Mucous membranes are moist.  Eyes:     Extraocular Movements: Extraocular movements intact.     Pupils: Pupils are equal, round, and reactive to light.  Musculoskeletal:     Cervical back: Normal range of motion.  Neurological:     Mental Status: She is alert.       Assessment/Plan: Right otitis externa- she could go home any time when pain better.  I would recommend pseudomonas coverage with Ciprofloxin outpatient and ciprodex drops. WElza Rafterout in 5 days but most likely will come out on its own. Strict water precautions in right ear. Follow up with ENT [phone] not resolved in 3-4 days, worse, or wick still in. Follow up in 2-3 weeks to check if there is really a perforation.   JMelissa Montane12/14/2023,  10:10 AM

## 2022-09-03 NOTE — ED Provider Notes (Signed)
[facility] DEPT Provider Note   CSN: [id number] Arrival date & time: 09/02/22  2053     History  Chief Complaint  Patient presents with   Ear Pain    Melissa Washington is a 36 y.o. female.  The history is provided by the patient and medical records.   36 y.o. F presenting to the ED with right ear pain and facial swelling.  Recent trip to [place], did some scuba diving while there.  States she was seen at local clinic there due to ear pain, told she ruptured her TM.  She was placed on oral abx, sounds like Augmentin based on her history.  States since returning home she has been having increased pain and swelling to right side of her face and pus draining from her ear.  Also reports sporadic fevers, nausea, and vomiting.  She has been taking excedrin without relief.  Home Medications Prior to Admission medications   Medication Sig Start Date End Date Taking? Authorizing Provider  blood glucose meter kit and supplies Dispense based on patient and insurance preference. Use up to four times daily as directed. (FOR ICD-9 250.00, 250.01). 04/11/17   Dessa Phi, DO  fluconazole (DIFLUCAN) 150 MG tablet Take 1 tablet (150 mg total) by mouth every 3 (three) days. 04/18/22   Teodora Medici, FNP  ibuprofen (ADVIL) 600 MG tablet Take 1 tablet (600 mg total) by mouth every 6 (six) hours as needed for mild pain. 08/17/22   Raspet, Derry Skill, PA-C  Insulin Glargine (LANTUS) 100 UNIT/ML Solostar Pen Inject 30 Units into the skin daily at 10 pm. Patient taking differently: Inject 32 Units into the skin daily at 10 pm.  04/11/17 09/18/23  Dessa Phi, DO  Insulin Pen Needle 31G X 5 MM MISC Use with insulin, 4 times daily 04/11/17   Dessa Phi, DO  levonorgestrel (MIRENA) 20 MCG/24HR IUD 1 each by Intrauterine route continuous.    [provider]  lisinopril (ZESTRIL) 10 MG tablet Take 1 tablet by mouth daily. 02/17/20   [provider]   metFORMIN (GLUCOPHAGE-XR) 500 MG 24 hr tablet Take 500 mg by mouth 2 (two) times daily. 11/10/19   [provider]  methocarbamol (ROBAXIN) 500 MG tablet Take 1 tablet (500 mg total) by mouth every 8 (eight) hours as needed for muscle spasms. 08/17/22   Raspet, Derry Skill, PA-C  metroNIDAZOLE (FLAGYL) 500 MG tablet Take 1 tablet (500 mg total) by mouth 2 (two) times daily. 04/21/22   Lamptey, Myrene Galas, MD  NOVOLOG FLEXPEN 100 UNIT/ML FlexPen Inject 3-20 Units into the skin in the morning, at noon, and at bedtime. 11/10/19   [provider]  ondansetron (ZOFRAN ODT) 4 MG disintegrating tablet Take 1 tablet (4 mg total) by mouth every 8 (eight) hours as needed for nausea or vomiting. 02/22/21   Raspet, Derry Skill, PA-C  ferrous sulfate 325 (65 FE) MG tablet Take 1 tablet (325 mg total) by mouth daily with breakfast. Patient not taking: No sig reported 01/15/20 01/03/21  Regalado, Cassie Freer, MD      Allergies    Ultram [tramadol hcl]    Review of Systems   Review of Systems  HENT:  Positive for ear pain.   All other systems reviewed and are negative.   Physical Exam Updated Vital Signs BP 138/86 (BP Location: Right Arm)   Pulse 97   Temp 98.5 F (36.9 C) (Oral)   Resp 18   Ht 5'  2" (1.575 m)   Wt 120.2 kg   SpO2 100%   BMI 48.47 kg/m   Physical Exam Vitals and nursing note reviewed.  Constitutional:      Appearance: She is well-developed.  HENT:     Head: Normocephalic and atraumatic.     Ears:     Comments: Swelling to right mastoid and just anterior to the ear, mastoid is exquisitely tender, there is mixed blood and purulent material draining from the right EAC, canal is swollen and very tender to touch, unable to tolerate full otoscope exam Eyes:     Conjunctiva/sclera: Conjunctivae normal.     Pupils: Pupils are equal, round, and reactive to light.  Cardiovascular:     Rate and Rhythm: Normal rate and regular rhythm.     Heart sounds: Normal heart sounds.  Pulmonary:      Effort: Pulmonary effort is normal. No respiratory distress.     Breath sounds: Normal breath sounds. No rhonchi.  Abdominal:     General: Bowel sounds are normal.     Palpations: Abdomen is soft.  Musculoskeletal:        General: Normal range of motion.     Cervical back: Normal range of motion.  Skin:    General: Skin is warm and dry.  Neurological:     Mental Status: She is alert and oriented to person, place, and time.     ED Results / Procedures / Treatments   Labs (all labs ordered are listed, but only abnormal results are displayed) Labs Reviewed  CBC - Abnormal; Notable for the following components:      Result Value   WBC 11.4 (*)    All other components within normal limits  BASIC METABOLIC PANEL - Abnormal; Notable for the following components:   Sodium 134 (*)    Glucose, Bld 387 (*)    All other components within normal limits    EKG None  Radiology CT Temporal Bones W Contrast  Result Date: 09/03/2022 CLINICAL DATA:  Right facial swelling EXAM: CT TEMPORAL BONES WITH CONTRAST TECHNIQUE: Axial and coronal plane CT imaging of the petrous temporal bones was performed with thin-collimation image reconstruction following intravenous contrast administration. Multiplanar CT image reconstructions were also generated. RADIATION DOSE REDUCTION: This exam was performed according to the departmental dose-optimization program which includes automated exposure control, adjustment of the mA and/or kV according to patient size and/or use of iterative reconstruction technique. CONTRAST:  191m OMNIPAQUE IOHEXOL 300 MG/ML  SOLN COMPARISON:  None Available. FINDINGS: RIGHT TEMPORAL BONE External auditory canal: Diffuse soft tissue thickening Middle ear cavity: Partial opacification. The ossicles are intact. Scutum is sharp. Inner ear structures: The cochlea, vestibule and semicircular canals are normal. The vestibular aqueduct is not enlarged. Internal auditory and facial nerve  canals:  Normal Mastoid air cells: Moderate amount of fluid in the right mastoid LEFT TEMPORAL BONE External auditory canal: Normal. Middle ear cavity: Normally aerated. The scutum and ossicles are normal. The tegmen tympani is intact. Inner ear structures: The cochlea, vestibule and semicircular canals are normal. The vestibular aqueduct is not enlarged. Internal auditory and facial nerve canals:  Normal. Mastoid air cells: Normally aerated. No osseous erosion. Vascular: Normal non-contrast appearance of the carotid canals, jugular bulbs and sigmoid plates. Limited intracranial:  No acute or significant finding. Visible orbits/paranasal sinuses: No acute or significant finding. IMPRESSION: 1. Diffuse soft tissue thickening of the right external auditory canal and partial opacification of the right middle ear cavity and right mastoid,  consistent with otomastoiditis and otitis externa. 2. Normal left temporal bone. 3. No abscess or drainable fluid collection. Electronically Signed   By: Ulyses Jarred M.D.   On: 09/03/2022 00:46    Procedures Procedures    Ear wick placement: - aid with drainage of right EAC, application of drops - performed without issue, no complications observed  Medications Ordered in ED Medications  iohexol (OMNIPAQUE) 300 MG/ML solution 100 mL (100 mLs Intravenous Contrast Given 09/02/22 2328)    ED Course/ Medical Decision Making/ A&P                           Medical Decision Making Risk Prescription drug management. Decision regarding hospitalization.   36 year old female presenting to the ED with right ear pain.  Recently traveled to [place] and had underwater excursion,.  While there she ruptured her right TM and was seen at local clinic.  Seems she was on Augmentin for a few days but continues to have worsening symptoms, now with pus draining from her right ear.  She is afebrile, nontoxic, but does appear uncomfortable.  She has mixed blood and pus  draining from the right ear canal, does have some swelling and exquisite tenderness over the mastoid.  Her canal is swollen and TM not visualized.  She does have a lot of discomfort with otoscopic exam.  Labs were obtained from triage, white blood cell count 11.9.  No electrolyte derangement.  CT temporal bones concerning for otomastoiditis and otitis externa.  Will discuss with ENT.  1:12 AM Spoke with on call ENT, Dr. Janace Hoard-- given she is worsening on PO abx, will need admission for IV abx.  Needs coverage for pseudomonas.  Also requested placement of ear wick to help with distributing ciprodex drops.  Will start IV cipro.  He will see in AM for consultation but no acute intervention needed tonight.  1:34 AM Ear wick placed without issue.  Will apply ciprodex drops.  Discussed with hospitalist, Dr. Nevada Crane-- will admit for ongoing care.  Final Clinical Impression(s) / ED Diagnoses Final diagnoses:  Mastoiditis of right side    Rx / DC Orders ED Discharge Orders     None         Larene Pickett, PA-C 09/03/22 0227    Palumbo, April, MD 09/03/22 [phone]

## 2022-09-04 DIAGNOSIS — H70001 Acute mastoiditis without complications, right ear: Secondary | ICD-10-CM | POA: Diagnosis not present

## 2022-09-04 LAB — CBC
HCT: 33.5 % — ABNORMAL LOW (ref 36.0–46.0)
Hemoglobin: 11 g/dL — ABNORMAL LOW (ref 12.0–15.0)
MCH: 28.7 pg (ref 26.0–34.0)
MCHC: 32.8 g/dL (ref 30.0–36.0)
MCV: 87.5 fL (ref 80.0–100.0)
Platelets: 232 10*3/uL (ref 150–400)
RBC: 3.83 MIL/uL — ABNORMAL LOW (ref 3.87–5.11)
RDW: 12.5 % (ref 11.5–15.5)
WBC: 8.9 10*3/uL (ref 4.0–10.5)
nRBC: 0 % (ref 0.0–0.2)

## 2022-09-04 LAB — BASIC METABOLIC PANEL
Anion gap: 9 (ref 5–15)
BUN: 18 mg/dL (ref 6–20)
CO2: 26 mmol/L (ref 22–32)
Calcium: 9 mg/dL (ref 8.9–10.3)
Chloride: 104 mmol/L (ref 98–111)
Creatinine, Ser: 0.62 mg/dL (ref 0.44–1.00)
GFR, Estimated: >60 mL/min (ref >60)
Glucose, Bld: 194 mg/dL — ABNORMAL HIGH (ref 70–99)
Potassium: 4 mmol/L (ref 3.5–5.1)
Sodium: 139 mmol/L (ref 135–145)

## 2022-09-04 LAB — GLUCOSE, CAPILLARY
Glucose-Capillary: 190 mg/dL — ABNORMAL HIGH (ref 70–99)
Glucose-Capillary: 237 mg/dL — ABNORMAL HIGH (ref 70–99)
Glucose-Capillary: 227 mg/dL — ABNORMAL HIGH (ref 70–99)
Glucose-Capillary: 245 mg/dL — ABNORMAL HIGH (ref 70–99)

## 2022-09-04 MED ORDER — METHOCARBAMOL 500 MG PO TABS
500.0000 mg | ORAL_TABLET | Freq: Three times a day (TID) | ORAL | Status: DC
  Administered 2022-09-04 – 2022-09-06 (×6): 500 mg via ORAL
  Filled 2022-09-04 (×6): qty 1

## 2022-09-04 MED ORDER — PIPERACILLIN-TAZOBACTAM 3.375 G IVPB
3.3750 g | Freq: Three times a day (TID) | INTRAVENOUS | Status: DC
  Administered 2022-09-04 – 2022-09-06 (×6): 3.375 g via INTRAVENOUS
  Filled 2022-09-04 (×6): qty 50

## 2022-09-04 NOTE — Progress Notes (Signed)
  Transition of Care [facility]) Screening Note   Patient Details  Name: Melissa Washington Date of Birth: 07-23-1986   Transition of Care [facility]) CM/SW Contact:    Vassie Moselle, LCSW Phone Number: 09/04/2022, 8:51 AM    Transition of Care Department [facility]) has reviewed patient and no TOC needs have been identified at this time. We will continue to monitor patient advancement through interdisciplinary progression rounds. If new patient transition needs arise, please place a TOC consult.

## 2022-09-04 NOTE — Progress Notes (Signed)
Mobility Specialist - Progress Note   09/04/22 1239  Mobility  Activity Ambulated with assistance in hallway  Level of Assistance Independent  Assistive Device None  Distance Ambulated (ft) 250 ft  Activity Response Tolerated well  Mobility Referral No  $Mobility charge 1 Mobility   Pt received in bed and agreed to mobility. Had no issues during session and pt is IND with mobility. Pt returned to bed with all needs met and family in room.   Roderick Pee Mobility Specialist

## 2022-09-04 NOTE — Hospital Course (Signed)
PMH of type 2 diabetes mellitus, morbid obesity and HTN and history of APML currently in remission presented to the hospital with complaints of ear pain.  Found to have mastoiditis.  Currently on IV antibiotics.

## 2022-09-04 NOTE — Progress Notes (Signed)
Pharmacy Antibiotic Note  Melissa Washington is a 36 y.o. female admitted on 09/02/2022 with mastoiditis.  Pharmacy has been consulted for Zosyn dosing.  Plan: Zosyn 3.375gm IV q8h (4hr extended infusions) No dose adjustments anticipated.  Pharmacy will sign off and monitor peripherally via electronic surveillance software for any changes in renal function or micro data.   Height: '5\' 2"'$  (157.5 cm) Weight: 120.2 kg (265 lb) IBW/kg (Calculated) : 50.1  Temp (24hrs), Avg:98 F (36.7 C), Min:97.6 F (36.4 C), Max:98.5 F (36.9 C)  Recent Labs  Lab 09/02/22 2144 09/03/22 1207 09/04/22 0554  WBC 11.4* 9.5 8.9  CREATININE 0.94 0.57 0.62    Estimated Creatinine Clearance: 122.2 mL/min (by C-G formula based on SCr of 0.62 mg/dL).    Allergies  Allergen Reactions   Ultram [Tramadol Hcl] Nausea And Vomiting    Antimicrobials this admission: 12/14 Cipro >> 12/15 12/14 Doxy >> 12/15 Zosyn >>  Dose adjustments this admission:  Microbiology results: None  Thank you for allowing pharmacy to be a part of this patient's care.  Peggyann Juba, PharmD, BCPS Pharmacy: [phone] 09/04/2022 2:48 PM

## 2022-09-04 NOTE — Progress Notes (Signed)
[facility] Progress Note Patient: Melissa Washington VKP:[id number] DOB: Jun 29, 1986 DOA: 09/02/2022  DOS: the patient was seen and examined on 09/04/2022  Brief hospital course: PMH of type 2 diabetes mellitus, morbid obesity and HTN and history of APML currently in remission presented to the hospital with complaints of ear pain.  Found to have mastoiditis.  Currently on IV antibiotics. Assessment and Plan: Right-sided mastoiditis. ENT consulted. Recommend conservative management with IV antibiotics. Will switch IV Cipro to IV Zosyn. Continue Ciprodex drops. Continue pain control.  Type 2 diabetes mellitus, uncontrolled with hyperglycemia.  On long-term insulin use.  No complication. Will treat aggressively with sliding scale insulin.  HTN Blood pressure stable.  Monitor.  History of APML. Monitor.  Obesity class III Body mass index is 48.47 kg/m.  Placing the pt at higher risk of poor outcomes.   Subjective: Reports uncontrolled pain.  No nausea no vomiting no fever no chills.  Physical Exam: General: in severe distress;  Cardiovascular: S1 and S2 Present, no Murmur Respiratory: normal respiratory effort, Bilateral Air entry present, no Crackles, no wheezes Abdomen: Bowel Sound present, Non tender  Extremities: no edema Neurology: alert and oriented to time, place, and person   Data Reviewed: I have Reviewed nursing notes, Vitals, and Lab results. I have ordered test including CBC and BMP  .   Disposition: Status is: Inpatient Remains inpatient appropriate because: Need for IV antibiotic and IV pain control.  Family Communication: No one at bedside Level of care: Med-Surg Vitals:   09/03/22 2143 09/04/22 0237 09/04/22 0508 09/04/22 1300  BP: 112/72 122/66 (!) 152/92 122/72  Pulse: 85 85 86 89  Resp: '18 18 18 15  '$ Temp: 98 F (36.7 C) 97.8 F (36.6 C) 98.2 F (36.8 C) 97.6 F (36.4 C)  TempSrc:      SpO2: 99% 100% 96% 99%  Weight:      Height:          Author: Berle Mull, MD 09/04/2022 8:00 PM  Please look on [email or web address] to find out who is on call.

## 2022-09-04 NOTE — Inpatient Diabetes Management (Signed)
Inpatient Diabetes Program Recommendations  AACE/ADA: New Consensus Statement on Inpatient Glycemic Control (2015)  Target Ranges:  Prepandial:   less than 140 mg/dL      Peak postprandial:   less than 180 mg/dL (1-2 hours)      Critically ill patients:  140 - 180 mg/dL    Latest Reference Range & Units 09/03/22 12:17 09/03/22 17:13 09/03/22 21:45 09/04/22 07:46 09/04/22 11:57  Glucose-Capillary 70 - 99 mg/dL 364 (H)  15 units Novolog  32 units Semglee '@1219'$  256 (H)  11 units Novolog  218 (H)  2 units Novolog  190 (H)  4 units Novolog  237 (H)  7 units Novolog   (H): Data is abnormally high      Home DM Meds: Lantus 32 units QHS      Novolog 3-20 units TID      Metformin 500 mg BID   Current Orders: Semglee 32 units QHS    Novolog 0-20 units TID ac/hs     Current A1c= 9.8%--Last A1c was 10.6% at PCP office visit Nov 2023    MD- Please consider starting Novolog Meal Coverage:  Novolog 4 units TID with meals HOLD if pt NPO HOLD if pt eats <50% meals    --Will follow patient during hospitalization--  Wyn Quaker RN, MSN, [place] Diabetes Coordinator Inpatient Glycemic Control Team Team Pager: [phone] (8a-5p)

## 2022-09-05 DIAGNOSIS — H70001 Acute mastoiditis without complications, right ear: Secondary | ICD-10-CM | POA: Diagnosis not present

## 2022-09-05 LAB — GLUCOSE, CAPILLARY
Glucose-Capillary: 157 mg/dL — ABNORMAL HIGH (ref 70–99)
Glucose-Capillary: 168 mg/dL — ABNORMAL HIGH (ref 70–99)
Glucose-Capillary: 216 mg/dL — ABNORMAL HIGH (ref 70–99)
Glucose-Capillary: 141 mg/dL — ABNORMAL HIGH (ref 70–99)

## 2022-09-05 MED ORDER — INSULIN GLARGINE-YFGN 100 UNIT/ML ~~LOC~~ SOLN
40.0000 [IU] | Freq: Every day | SUBCUTANEOUS | Status: DC
  Administered 2022-09-05: 40 [IU] via SUBCUTANEOUS
  Filled 2022-09-05 (×2): qty 0.4

## 2022-09-05 MED ORDER — INSULIN ASPART 100 UNIT/ML IJ SOLN
6.0000 [IU] | Freq: Three times a day (TID) | INTRAMUSCULAR | Status: DC
  Administered 2022-09-05 – 2022-09-06 (×5): 6 [IU] via SUBCUTANEOUS

## 2022-09-05 NOTE — Progress Notes (Signed)
[facility] Progress Note Patient: Melissa Washington [id number]:[id number] DOB: 02/11/1986 DOA: 09/02/2022  DOS: the patient was seen and examined on 09/05/2022  Brief hospital course: PMH of type 2 diabetes mellitus, morbid obesity and HTN and history of APML currently in remission presented to the hospital with complaints of ear pain.  Found to have mastoiditis.  Currently on IV antibiotics. Assessment and Plan: Right-sided mastoiditis. ENT consulted. Recommend conservative management with IV antibiotics. Initially was on IV Cipro.  Now on IV Zosyn.  Will continue for now. Swelling appears to be improving. Continue eardrops.  Continue pain control  Type 2 diabetes mellitus, uncontrolled with hyperglycemia.  On long-term insulin use.  No complication. Hemoglobin A1c 9.8. Uncontrolled but improving from prior. Will continue with sliding scale insulin. Add Premeal coverage. Continue home Lantus.  HTN Blood pressure stable.  Monitor.  History of APML. Monitor.  Obesity class III Body mass index is 48.47 kg/m.  Placing the pt at higher risk of poor outcomes.   Subjective: Pain control improving.  No nausea no vomiting.  No fever no chills.    Physical Exam: General: Appear in mild distress; Cardiovascular: S1 and S2 Present, no Murmur, Respiratory: good respiratory effort, Bilateral Air entry present, CTA, no Crackles, no wheezes Abdomen: Bowel Sound present, Non tender  Extremities: no Pedal edema Neurology: alert and oriented to time, place, and person   Data Reviewed: I have Reviewed nursing notes, Vitals, and Lab results. CBGs are elevated.  Disposition: Status is: Inpatient Remains inpatient appropriate because: Need for IV antibiotic and IV pain control.  Family Communication: No one at bedside Level of care: Med-Surg Vitals:   09/04/22 0508 09/04/22 1300 09/04/22 2249 09/05/22 1356  BP: (!) 152/92 122/72 106/68 121/71  Pulse: 86 89 83 92  Resp: '18 15 18 20   '$ Temp: 98.2 F (36.8 C) 97.6 F (36.4 C) 98.2 F (36.8 C) 98.7 F (37.1 C)  TempSrc:   Oral Oral  SpO2: 96% 99% 98% 100%  Weight:      Height:         Author: Berle Mull, MD 09/05/2022 5:22 PM  Please look on [email or web address] to find out who is on call.

## 2022-09-06 DIAGNOSIS — H70001 Acute mastoiditis without complications, right ear: Secondary | ICD-10-CM | POA: Diagnosis not present

## 2022-09-06 LAB — GLUCOSE, CAPILLARY
Glucose-Capillary: 159 mg/dL — ABNORMAL HIGH (ref 70–99)
Glucose-Capillary: 175 mg/dL — ABNORMAL HIGH (ref 70–99)

## 2022-09-06 MED ORDER — DOXYCYCLINE HYCLATE 100 MG PO TABS: 100.0000 mg | ORAL_TABLET | Freq: Two times a day (BID) | ORAL | 0 refills | Status: AC

## 2022-09-06 MED ORDER — CIPROFLOXACIN-DEXAMETHASONE 0.3-0.1 % OT SUSP: 4.0000 [drp] | Freq: Two times a day (BID) | OTIC | 0 refills | Status: AC

## 2022-09-06 MED ORDER — DOCUSATE SODIUM 100 MG PO CAPS: 100.0000 mg | ORAL_CAPSULE | Freq: Two times a day (BID) | ORAL | 0 refills | Status: DC | PRN

## 2022-09-06 MED ORDER — METHOCARBAMOL 500 MG PO TABS: 500.0000 mg | ORAL_TABLET | Freq: Three times a day (TID) | ORAL | 0 refills | Status: AC

## 2022-09-06 MED ORDER — CIPROFLOXACIN HCL 500 MG PO TABS: 500.0000 mg | ORAL_TABLET | Freq: Two times a day (BID) | ORAL | 0 refills | Status: AC

## 2022-09-06 MED ORDER — OXYCODONE-ACETAMINOPHEN 5-325 MG PO TABS: 1.0000 | ORAL_TABLET | Freq: Four times a day (QID) | ORAL | 0 refills | Status: AC | PRN

## 2022-09-06 MED ORDER — FLUCONAZOLE 200 MG PO TABS: 200.0000 mg | ORAL_TABLET | Freq: Once | ORAL | 0 refills | Status: AC

## 2022-09-06 MED ORDER — SACCHAROMYCES BOULARDII 250 MG PO CAPS: 250.0000 mg | ORAL_CAPSULE | Freq: Two times a day (BID) | ORAL | 0 refills | Status: AC

## 2022-09-06 MED ORDER — NAPROXEN 500 MG PO TBEC: 500.0000 mg | DELAYED_RELEASE_TABLET | Freq: Two times a day (BID) | ORAL | 0 refills | Status: AC

## 2022-09-06 NOTE — Plan of Care (Signed)
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
## 2023-03-05 ENCOUNTER — Other Ambulatory Visit: Payer: Self-pay

## 2023-03-05 ENCOUNTER — Emergency Department (HOSPITAL_COMMUNITY): Payer: Medicaid Other

## 2023-03-05 ENCOUNTER — Encounter (HOSPITAL_COMMUNITY): Payer: Self-pay | Admitting: Emergency Medicine

## 2023-03-05 ENCOUNTER — Emergency Department (HOSPITAL_COMMUNITY)
Admission: EM | Admit: 2023-03-05 | Discharge: 2023-03-06 | Disposition: A | Discharge status: Home / Self Care | Payer: Medicaid Other | Attending: Emergency Medicine | Admitting: Emergency Medicine

## 2023-03-05 DIAGNOSIS — Z79899 Other long term (current) drug therapy: Secondary | ICD-10-CM | POA: Diagnosis not present

## 2023-03-05 DIAGNOSIS — R5383 Other fatigue: Secondary | ICD-10-CM | POA: Insufficient documentation

## 2023-03-05 DIAGNOSIS — Z7984 Long term (current) use of oral hypoglycemic drugs: Secondary | ICD-10-CM | POA: Insufficient documentation

## 2023-03-05 DIAGNOSIS — I10 Essential (primary) hypertension: Secondary | ICD-10-CM | POA: Insufficient documentation

## 2023-03-05 DIAGNOSIS — R42 Dizziness and giddiness: Secondary | ICD-10-CM | POA: Diagnosis present

## 2023-03-05 DIAGNOSIS — R519 Headache, unspecified: Secondary | ICD-10-CM | POA: Diagnosis not present

## 2023-03-05 DIAGNOSIS — E1165 Type 2 diabetes mellitus with hyperglycemia: Secondary | ICD-10-CM | POA: Insufficient documentation

## 2023-03-05 DIAGNOSIS — Z794 Long term (current) use of insulin: Secondary | ICD-10-CM | POA: Diagnosis not present

## 2023-03-05 DIAGNOSIS — R739 Hyperglycemia, unspecified: Secondary | ICD-10-CM

## 2023-03-05 LAB — I-STAT BETA HCG BLOOD, ED (MC, WL, AP ONLY): I-stat hCG, quantitative: <5 m[IU]/mL (ref <5)

## 2023-03-05 LAB — CBG MONITORING, ED: Glucose-Capillary: 262 mg/dL — ABNORMAL HIGH (ref 70–99)

## 2023-03-05 NOTE — ED Triage Notes (Signed)
Pt in with dizziness, head and R lower backache. Pt states dizziness began this morning, chest pain this afternoon and back pain this evening. States she gets these feeling when her blood sugar is out of control, has not taken her CBG at home. CP described as heaviness

## 2023-03-06 LAB — CBC
HCT: 34.7 % — ABNORMAL LOW (ref 36.0–46.0)
Hemoglobin: 11.5 g/dL — ABNORMAL LOW (ref 12.0–15.0)
MCH: 28.6 pg (ref 26.0–34.0)
MCHC: 33.1 g/dL (ref 30.0–36.0)
MCV: 86.3 fL (ref 80.0–100.0)
Platelets: 233 10*3/uL (ref 150–400)
RBC: 4.02 MIL/uL (ref 3.87–5.11)
RDW: 12.3 % (ref 11.5–15.5)
WBC: 8.5 10*3/uL (ref 4.0–10.5)
nRBC: 0 % (ref 0.0–0.2)

## 2023-03-06 LAB — CBG MONITORING, ED
Glucose-Capillary: 236 mg/dL — ABNORMAL HIGH (ref 70–99)
Glucose-Capillary: 188 mg/dL — ABNORMAL HIGH (ref 70–99)

## 2023-03-06 LAB — BASIC METABOLIC PANEL
Anion gap: 8 (ref 5–15)
BUN: 11 mg/dL (ref 6–20)
CO2: 24 mmol/L (ref 22–32)
Calcium: 8.9 mg/dL (ref 8.9–10.3)
Chloride: 102 mmol/L (ref 98–111)
Creatinine, Ser: 0.58 mg/dL (ref 0.44–1.00)
GFR, Estimated: >60 mL/min (ref >60)
Glucose, Bld: 258 mg/dL — ABNORMAL HIGH (ref 70–99)
Potassium: 3.9 mmol/L (ref 3.5–5.1)
Sodium: 134 mmol/L — ABNORMAL LOW (ref 135–145)

## 2023-03-06 LAB — TROPONIN I (HIGH SENSITIVITY)
Troponin I (High Sensitivity): 3 ng/L (ref <18)
Troponin I (High Sensitivity): 5 ng/L (ref <18)

## 2023-03-06 MED ORDER — INSULIN ASPART 100 UNIT/ML IJ SOLN: 10.0000 [IU] | Freq: Once | INTRAMUSCULAR | Status: DC

## 2023-03-06 MED ORDER — INSULIN ASPART 100 UNIT/ML IJ SOLN: 12.0000 [IU] | Freq: Once | INTRAMUSCULAR | Status: DC

## 2023-03-06 MED ORDER — NOVOLOG FLEXPEN 100 UNIT/ML ~~LOC~~ SOPN: 3.0000 [IU] | PEN_INJECTOR | Freq: Three times a day (TID) | SUBCUTANEOUS | 1 refills | Status: AC

## 2023-03-06 MED ORDER — METOCLOPRAMIDE HCL 10 MG PO TABS: 10.0000 mg | ORAL_TABLET | Freq: Four times a day (QID) | ORAL | 0 refills | Status: DC

## 2023-03-06 MED ORDER — METOCLOPRAMIDE HCL 10 MG PO TABS
10.0000 mg | ORAL_TABLET | Freq: Once | ORAL | Status: AC
  Administered 2023-03-06: 10 mg via ORAL

## 2023-03-06 NOTE — Discharge Instructions (Addendum)
Please use your insulin as prescribed/directed.  Follow-up with your primary care doctor.  Return to the emergency room for any new or concerning symptoms.  Use Reglan as needed for nausea

## 2023-03-06 NOTE — ED Provider Notes (Signed)
[place] EMERGENCY DEPARTMENT AT [facility] Provider Note   CSN: [id number] Arrival date & time: 03/05/23  2259     History  Chief Complaint  Patient presents with   Dizziness   Headache    Melissa Washington is a 37 y.o. female.   Dizziness Associated symptoms: headaches   Headache Associated symptoms: dizziness    Patient is a 37 year old female with past medical history significant for HTN, nephrolithiasis, DM2, obesity  Patient presents emergency room today with complaints of feeling more lightheaded.  She states that she has also had a headache and also indicates that she has had some chest discomfort over the past 2 days as well.  She states that she has these symptoms frequently when she has elevated blood sugars.  No slurred speech, nausea, vomiting, fevers,.  She states no chest pain currently.  No shortness of breath.  She states that she was recently taken off of her short acting insulin and placed solely on Lantus.      Home Medications Prior to Admission medications   Medication Sig Start Date End Date Taking? Authorizing Provider  aspirin-acetaminophen-caffeine (EXCEDRIN MIGRAINE) [phone] MG tablet Take 2 tablets by mouth every 6 (six) hours as needed for headache.    [provider]  blood glucose meter kit and supplies Dispense based on patient and insurance preference. Use up to four times daily as directed. (FOR ICD-9 250.00, 250.01). 04/11/17   Noralee Stain, DO  docusate sodium (COLACE) 100 MG capsule Take 1 capsule (100 mg total) by mouth 2 (two) times daily as needed for mild constipation. 09/06/22   Rolly Salter, MD  Dulaglutide 0.75 MG/0.5ML SOPN Inject 0.75 mg into the skin once a week. Sunday 07/23/22   [provider]  Insulin Glargine (LANTUS) 100 UNIT/ML Solostar Pen Inject 30 Units into the skin daily at 10 pm. Patient taking differently: Inject 32 Units into the skin daily at 10 pm. 04/11/17 09/18/23  Noralee Stain, DO  Insulin Pen Needle 31G X 5 MM MISC Use with insulin, 4 times daily 04/11/17   Noralee Stain, DO  levonorgestrel (MIRENA) 20 MCG/24HR IUD 1 each by Intrauterine route continuous.    [provider]  lisinopril (ZESTRIL) 10 MG tablet Take 1 tablet by mouth daily. 02/17/20   [provider]  metFORMIN (GLUCOPHAGE-XR) 500 MG 24 hr tablet Take 500 mg by mouth 2 (two) times daily. 11/10/19   [provider]  NOVOLOG FLEXPEN 100 UNIT/ML FlexPen Inject 3-20 Units into the skin in the morning, at noon, and at bedtime. Patient not taking: Reported on 09/03/2022 11/10/19   [provider]  oxyCODONE-acetaminophen (PERCOCET) 5-325 MG tablet Take 1 tablet by mouth every 6 (six) hours as needed for severe pain or moderate pain. 09/06/22 09/06/23  Rolly Salter, MD  ferrous sulfate 325 (65 FE) MG tablet Take 1 tablet (325 mg total) by mouth daily with breakfast. Patient not taking: No sig reported 01/15/20 01/03/21  Regalado, Prentiss Bells, MD      Allergies    Ultram [tramadol hcl]    Review of Systems   Review of Systems  Neurological:  Positive for dizziness and headaches.    Physical Exam Updated Vital Signs BP (!) 142/89   Pulse 86   Temp 98.7 F (37.1 C) (Oral)   Resp 16   Wt 120.2 kg   LMP  (LMP Unknown)   SpO2 99%   BMI 48.47 kg/m  Physical Exam Vitals and nursing  note reviewed.  Constitutional:      General: She is not in acute distress.    Appearance: She is obese.     Comments: Pleasant well-appearing 37 year old.  In no acute distress.  Sitting comfortably in bed.  Able answer questions appropriately follow commands. No increased work of breathing. Speaking in full sentences.   HENT:     Head: Normocephalic and atraumatic.     Nose: Nose normal.     Mouth/Throat:     Mouth: Mucous membranes are moist.  Eyes:     General: No scleral icterus. Cardiovascular:     Rate and Rhythm: Normal rate and regular rhythm.     Pulses: Normal  pulses.     Heart sounds: Normal heart sounds.  Pulmonary:     Effort: Pulmonary effort is normal. No respiratory distress.     Breath sounds: No wheezing.     Comments: Speaking in full sentences Abdominal:     Palpations: Abdomen is soft.     Tenderness: There is no abdominal tenderness. There is no guarding or rebound.  Musculoskeletal:     Cervical back: Normal range of motion.     Right lower leg: No edema.     Left lower leg: No edema.  Skin:    General: Skin is warm and dry.     Capillary Refill: Capillary refill takes less than 2 seconds.  Neurological:     Mental Status: She is alert. Mental status is at baseline.  Psychiatric:        Mood and Affect: Mood normal.        Behavior: Behavior normal.     ED Results / Procedures / Treatments   Labs (all labs ordered are listed, but only abnormal results are displayed) Labs Reviewed  BASIC METABOLIC PANEL - Abnormal; Notable for the following components:      Result Value   Sodium 134 (*)    Glucose, Bld 258 (*)    All other components within normal limits  CBC - Abnormal; Notable for the following components:   Hemoglobin 11.5 (*)    HCT 34.7 (*)    All other components within normal limits  CBG MONITORING, ED - Abnormal; Notable for the following components:   Glucose-Capillary 262 (*)    All other components within normal limits  I-STAT BETA HCG BLOOD, ED ([facility], [facility], [facility] ONLY)  TROPONIN I (HIGH SENSITIVITY)  TROPONIN I (HIGH SENSITIVITY)    EKG EKG Interpretation  Date/Time:  Friday March 05 2023 23:07:22 EDT Ventricular Rate:  87 PR Interval:  158 QRS Duration: 86 QT Interval:  374 QTC Calculation: 450 R Axis:   35 Text Interpretation: Normal sinus rhythm Nonspecific ST and T wave abnormality unchanged Confirmed by Nicanor Alcon, April ([id number]) on 03/06/2023 1:36:29 AM  Radiology DG Chest 2 View  Result Date: 03/05/2023 CLINICAL DATA:  Chest pain and dizziness EXAM: CHEST - 2 VIEW COMPARISON:  01/03/2021  FINDINGS: The heart size and mediastinal contours are within normal limits. Both lungs are clear. The visualized skeletal structures are unremarkable. IMPRESSION: No active cardiopulmonary disease. Electronically Signed   By: Alcide Clever M.D.   On: 03/05/2023 23:50    Procedures Procedures    Medications Ordered in ED Medications - No data to display  ED Course/ Medical Decision Making/ A&P                             Medical Decision Making Amount  and/or Complexity of Data Reviewed Labs: ordered. Radiology: ordered.  Risk Prescription drug management.   This patient presents to the ED for concern of fatigue, this involves a number of treatment options, and is a complaint that carries with it a moderate risk of complications and morbidity. A differential diagnosis was considered for the patient's symptoms which is discussed below:   The differential diagnosis of weakness includes but is not limited to neurologic causes (GBS, myasthenia gravis, CVA, MS, ALS, transverse myelitis, spinal cord injury, CVA, botulism, ) and other causes: ACS, Arrhythmia, syncope, orthostatic hypotension, sepsis, hypoglycemia, electrolyte disturbance, hypothyroidism, respiratory failure, symptomatic anemia, dehydration, heat injury, polypharmacy, malignancy.    Co morbidities: Discussed in HPI   Brief History:  Patient is a 37 year old female with past medical history significant for HTN, nephrolithiasis, DM2, obesity  Patient presents emergency room today with complaints of feeling more lightheaded.  She states that she has also had a headache and also indicates that she has had some chest discomfort over the past 2 days as well.  She states that she has these symptoms frequently when she has elevated blood sugars.  No slurred speech, nausea, vomiting, fevers,.  She states no chest pain currently.  No shortness of breath.  She states that she was recently taken off of her short acting insulin and  placed solely on Lantus.     EMR reviewed including pt PMHx, past surgical history and past visits to ER.   See HPI for more details   Lab Tests:   I personally reviewed all laboratory work and imaging. Metabolic panel without any acute abnormality specifically kidney function within normal limits and no significant electrolyte abnormalities. CBC without leukocytosis or significant anemia. Notably patient is hyperglycemic with blood sugar of 258 given 10U of insulin SQ and reglan PO by Aggie Cosier RN--RN states some issues w documenting med administration but assures me insulin was given.  Blood sugar improved to 188, troponin x 2 within normal limits  Imaging Studies:  NAD. I personally reviewed all imaging studies and no acute abnormality found. I agree with radiology interpretation.    Cardiac Monitoring:  NA EKG non-ischemic   Medicines ordered:  I ordered medication including Reglan, insulin for headache, hyperglycemia Reevaluation of the patient after these medicines showed that the patient improved I have reviewed the patients home medicines and have made adjustments as needed   Critical Interventions:     Consults/Attending Physician      Reevaluation:  After the interventions noted above I re-evaluated patient and found that they have :improved   Social Determinants of Health:  The patient's social determinants of health were a factor in the care of this patient    Problem List / ED Course:  Patient here with symptoms that she is attributed to hypoglycemia.  He is fatigue is her main complaint.  She has felt dehydrated. No urinary sx to indicate UTI -- she was just taken off her short acting insulin which is likely the reason for her hyperglycemia. She is well appearing. Tolerating PO.  Pt will follow up outpatient regarding her hyperglycemia.  Dispostion:  After consideration of the diagnostic results and the patients response to treatment, I  feel that the patent would benefit from discharge w close follow up.    Final Clinical Impression(s) / ED Diagnoses Final diagnoses:  Hyperglycemia    Rx / DC Orders ED Discharge Orders     None         Gailen Shelter,  PA 03/06/23 2226    Palumbo, April, MD 03/16/23 2725

## 2023-03-06 NOTE — ED Notes (Signed)
Assumed care of patient who arrived from triage via wc stating her blood sugar is out of control and she is dizzy and having heaviness to her chest. Patient text ing and talking on cell phone in no current distress. Patient a/o x 4 respirations even and non labored pending reassement by medical provider

## 2023-09-24 ENCOUNTER — Ambulatory Visit (HOSPITAL_COMMUNITY)
Admission: EM | Admit: 2023-09-24 | Discharge: 2023-09-24 | Disposition: A | Discharge status: Home / Self Care | Payer: Medicaid Other | Attending: Emergency Medicine | Admitting: Emergency Medicine

## 2023-09-24 ENCOUNTER — Encounter (HOSPITAL_COMMUNITY): Payer: Self-pay

## 2023-09-24 DIAGNOSIS — N760 Acute vaginitis: Secondary | ICD-10-CM | POA: Insufficient documentation

## 2023-09-24 DIAGNOSIS — E119 Type 2 diabetes mellitus without complications: Secondary | ICD-10-CM | POA: Insufficient documentation

## 2023-09-24 DIAGNOSIS — B9689 Other specified bacterial agents as the cause of diseases classified elsewhere: Secondary | ICD-10-CM | POA: Insufficient documentation

## 2023-09-24 DIAGNOSIS — Z794 Long term (current) use of insulin: Secondary | ICD-10-CM | POA: Diagnosis not present

## 2023-09-24 DIAGNOSIS — Z7984 Long term (current) use of oral hypoglycemic drugs: Secondary | ICD-10-CM | POA: Insufficient documentation

## 2023-09-24 LAB — POCT FASTING CBG KUC MANUAL ENTRY: POCT Glucose (KUC): 315 mg/dL — AB (ref 70–99)

## 2023-09-24 MED ORDER — FLUCONAZOLE 150 MG PO TABS: ORAL_TABLET | ORAL | 0 refills | Status: DC

## 2023-09-24 NOTE — ED Provider Notes (Signed)
 [facility]    CSN: [id number] Arrival date & time: 09/24/23  1826      History   Chief Complaint Chief Complaint  Patient presents with   Vaginal Itching    HPI Melissa Washington is a 38 y.o. female.   Patient presents to clinic for complaints of vaginal itching over the past 3 days. Patient reports vaginal discharge is 'yeast-like.' No color changes, no odor.  Denies any dysuria.  She has a Mirena  IUD for contraception.  She is concerned because she was also recently sexually active with a new partner without protection about a week ago.  Is not having any abdominal pain.  No flank pain.  No urgency or frequency.  History of type 2 diabetes.  Reports she does not check her sugar regularly, thinks the last time was about a week ago and the sugar was in the high 200s.  The history is provided by the patient and medical records.  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 Ecoli grew out in cultures. Sent home with Ceftriaxone  to complete 14 day course.    Bacteremia due to Escherichia coli 05/01/2013   Port-a-cath infection, removed 8/6 by IR. Ecoli grew out in cultures. Sent home with Ceftriaxone  to complete 14 day course.    Cholelithiasis 01/17/2012   Collagen vascular disease (HCC)    Diabetes mellitus    type 2, insulin    Headache(784.0)    Hepatic steatosis 01/17/2012   Hypertension    Leukemia, acute monocytic, in remission (HCC)    Nephrolithiasis    Obesity     Patient Active Problem List   Diagnosis Date Noted   Mastoiditis, acute, right 09/03/2022   Acute mastoiditis of right side 09/03/2022   Pyelonephritis 01/10/2020   Trochanteric bursitis, right hip 09/28/2018   Trochanteric bursitis, left hip 09/28/2018   Acute pyelonephritis 04/07/2017   Vaginitis and vulvovaginitis  10/09/2014   Alopecia 06/26/2014   Bilateral hip pain 06/19/2014   Anxiety associated with depression 06/19/2014   Allergic rhinitis 06/07/2014   IUD complication (HCC) 06/07/2014   Exposure to STD 06/07/2014   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 Cornett, MD;  Location: [facility];  Service: General;  Laterality: N/A;   PORTACATH PLACEMENT     RSC   portacath removal      OB History     Gravida  2   Para  2   Term  2   Preterm  0   AB  0   Living  2      SAB  0   IAB  0   Ectopic  0   Multiple  0   Live Births  2            Home Medications    Prior to Admission medications   Medication Sig Start Date End Date Taking? Authorizing Provider  fluconazole  (DIFLUCAN ) 150 MG tablet Take 1 tablet today and another in 72 hours if  vaginal itching persist. 09/24/23  Yes Wendolyn Raso  N, FNP  Semaglutide,0.25 or 0.5MG /DOS, (OZEMPIC, 0.25 OR 0.5 MG/DOSE,) 2 MG/3ML SOPN Inject into the skin. 08/17/23  Yes [provider]  aspirin -acetaminophen -caffeine (EXCEDRIN MIGRAINE) 250-250-65 MG tablet Take 2 tablets by mouth every 6 (six) hours as needed for headache.    [provider]  blood glucose meter kit and supplies Dispense based on patient and insurance preference. Use up to four times daily as directed. (FOR ICD-9 250.00, 250.01). 04/11/17   Rojelio Nest, DO  docusate sodium  (COLACE) 100 MG capsule Take 1 capsule (100 mg total) by mouth 2 (two) times daily as needed for mild constipation. 09/06/22   Tobie Yetta HERO, MD  Insulin  Glargine (LANTUS ) 100  UNIT/ML Solostar Pen Inject 30 Units into the skin daily at 10 pm. Patient taking differently: Inject 32 Units into the skin daily at 10 pm. 04/11/17 09/18/23  Rojelio Nest, DO  Insulin  Pen Needle 31G X 5 MM MISC Use with insulin , 4 times daily 04/11/17   Rojelio Nest, DO  levonorgestrel  (MIRENA ) 20 MCG/24HR IUD 1 each by Intrauterine route continuous.    [provider]  lisinopril  (ZESTRIL ) 10 MG tablet Take 1 tablet by mouth daily. 02/17/20   [provider]  metFORMIN  (GLUCOPHAGE -XR) 500 MG 24 hr tablet Take 500 mg by mouth 2 (two) times daily. 11/10/19   [provider]  NOVOLOG  FLEXPEN 100 UNIT/ML FlexPen Inject 3-20 Units into the skin in the morning, at noon, and at bedtime. 03/06/23   Neldon Hamp RAMAN, PA  ferrous sulfate  325 (65 FE) MG tablet Take 1 tablet (325 mg total) by mouth daily with breakfast. Patient not taking: No sig reported 01/15/20 01/03/21  Regalado, Owen LABOR, MD    Family History Family History  Problem Relation Age of Onset   Kidney disease Mother    Hypertension Mother    Epilepsy Mother    Sleep apnea Mother    Diabetes Father    Kidney disease Maternal Grandmother    Diabetes Maternal Grandmother     Social History Social History   Tobacco Use   Smoking status: Former   Smokeless tobacco: Never  Vaping Use   Vaping status: Never Used  Substance Use Topics   Alcohol use: No   Drug use: No     Allergies   Ultram  [tramadol  hcl]   Review of Systems Review of Systems  Per HPI   Physical Exam Triage Vital Signs ED Triage Vitals  Encounter Vitals Group     BP 09/24/23 1936 120/81     Systolic BP Percentile --      Diastolic BP Percentile --      Pulse Rate 09/24/23 1936 93     Resp 09/24/23 1936 16     Temp 09/24/23 1936 98.2 F (36.8 C)     Temp Source 09/24/23 1936 Oral     SpO2 09/24/23 1936 98 %     Weight 09/24/23 1936 257 lb (116.6 kg)     Height 09/24/23 1936 5' 2 (1.575 m)     Head Circumference --       Peak Flow --      Pain Score 09/24/23 1935 8     Pain Loc --      Pain Education --      Exclude from Growth Chart --    No data found.  Updated Vital Signs BP 120/81 (BP Location: Left Arm)   Pulse 93   Temp 98.2 F (  36.8 C) (Oral)   Resp 16   Ht 5' 2 (1.575 m)   Wt 257 lb (116.6 kg)   LMP  (LMP Unknown)   SpO2 98%   BMI 47.01 kg/m   Visual Acuity Right Eye Distance:   Left Eye Distance:   Bilateral Distance:    Right Eye Near:   Left Eye Near:    Bilateral Near:     Physical Exam Vitals and nursing note reviewed.  Constitutional:      Appearance: Normal appearance.  HENT:     Head: Normocephalic and atraumatic.     Right Ear: External ear normal.     Left Ear: External ear normal.     Nose: Nose normal.     Mouth/Throat:     Mouth: Mucous membranes are moist.  Eyes:     Conjunctiva/sclera: Conjunctivae normal.  Pulmonary:     Effort: Pulmonary effort is normal. No respiratory distress.  Musculoskeletal:        General: Normal range of motion.  Skin:    General: Skin is warm and dry.  Neurological:     General: No focal deficit present.     Mental Status: She is alert and oriented to person, place, and time.  Psychiatric:        Mood and Affect: Mood normal.        Behavior: Behavior normal.      UC Treatments / Results  Labs (all labs ordered are listed, but only abnormal results are displayed) Labs Reviewed  POCT FASTING CBG [facility] MANUAL ENTRY - Abnormal; Notable for the following components:      Result Value   POCT Glucose ([facility]) 315 (*)    All other components within normal limits  CERVICOVAGINAL ANCILLARY ONLY    EKG   Radiology No results found.  Procedures Procedures (including critical care time)  Medications Ordered in UC Medications - No data to display  Initial Impression / Assessment and Plan / UC Course  I have reviewed the triage vital signs and the nursing notes.  Pertinent labs & imaging results that were  available during my care of the patient were reviewed by me and considered in my medical decision making (see chart for details).  Vitals and triage reviewed, patient is hemodynamically stable.  Patient reports poorly controlled type 2 diabetes, CBG in clinic 315.  History of recurrent yeast vaginitis.  Recent unprotected sex.  Cytology swab obtained testing for vaginal infections.  Will treat for yeast with Diflucan .  No urinary symptoms and low concern for pregnancy with Mirena  IUD.  Encouraged further management of type 2 diabetes with PCP.  Plan of care, follow-up care return precautions given, no questions at this time.     Final Clinical Impressions(s) / UC Diagnoses   Final diagnoses:  Acute vaginitis     Discharge Instructions      Today you have been screened for some sexually transmitted infections as well as yeast and bacterial vaginosis.  I am treating you with Diflucan  for yeast.  You may repeat this dose in 72 hours if vaginal itching persist.  Abstain from intercourse until all results have been received and notify any sexual partners of any positive sexually transmitted infections.  Properly managing your blood sugar will help prevent recurrent yeast infections.  Please follow-up with your primary care provider regarding ongoing management of your type 2 diabetes.  Return to clinic for any new or urgent symptoms.     ED Prescriptions  Medication Sig Dispense Auth. Provider   fluconazole  (DIFLUCAN ) 150 MG tablet Take 1 tablet today and another in 72 hours if vaginal itching persist. 2 tablet Dreama, Labarron Durnin  N, FNP      PDMP not reviewed this encounter.   Dreama, Jenae Tomasello  N, FNP 09/24/23 2013

## 2023-09-24 NOTE — Discharge Instructions (Signed)
 Today you have been screened for some sexually transmitted infections as well as yeast and bacterial vaginosis.  I am treating you with Diflucan  for yeast.  You may repeat this dose in 72 hours if vaginal itching persist.  Abstain from intercourse until all results have been received and notify any sexual partners of any positive sexually transmitted infections.  Properly managing your blood sugar will help prevent recurrent yeast infections.  Please follow-up with your primary care provider regarding ongoing management of your type 2 diabetes.  Return to clinic for any new or urgent symptoms.

## 2023-09-24 NOTE — ED Triage Notes (Signed)
 Patient here today with c/o vaginal itching X 2 days.

## 2023-09-27 LAB — CERVICOVAGINAL ANCILLARY ONLY
Bacterial Vaginitis (gardnerella): NEGATIVE
Candida Glabrata: NEGATIVE
Candida Vaginitis: POSITIVE — AB
Chlamydia: NEGATIVE
Comment: NEGATIVE
Comment: NEGATIVE
Comment: NEGATIVE
Comment: NEGATIVE
Comment: NEGATIVE
Comment: NORMAL
Neisseria Gonorrhea: NEGATIVE
Trichomonas: NEGATIVE

## 2023-10-08 ENCOUNTER — Ambulatory Visit (HOSPITAL_COMMUNITY)
Admission: EM | Admit: 2023-10-08 | Discharge: 2023-10-08 | Disposition: A | Discharge status: Home / Self Care | Payer: Medicaid Other | Attending: Family Medicine | Admitting: Family Medicine

## 2023-10-08 ENCOUNTER — Encounter (HOSPITAL_COMMUNITY): Payer: Self-pay

## 2023-10-08 DIAGNOSIS — M545 Low back pain, unspecified: Secondary | ICD-10-CM | POA: Diagnosis not present

## 2023-10-08 DIAGNOSIS — M79604 Pain in right leg: Secondary | ICD-10-CM | POA: Diagnosis not present

## 2023-10-08 MED ORDER — KETOROLAC TROMETHAMINE 10 MG PO TABS: 10.0000 mg | ORAL_TABLET | Freq: Four times a day (QID) | ORAL | 0 refills | Status: AC | PRN

## 2023-10-08 MED ORDER — KETOROLAC TROMETHAMINE 30 MG/ML IJ SOLN
INTRAMUSCULAR | Status: AC
  Filled 2023-10-08: qty 1

## 2023-10-08 MED ORDER — KETOROLAC TROMETHAMINE 30 MG/ML IJ SOLN
30.0000 mg | Freq: Once | INTRAMUSCULAR | Status: AC
  Administered 2023-10-08: 30 mg via INTRAMUSCULAR

## 2023-10-08 MED ORDER — TIZANIDINE HCL 4 MG PO TABS: 4.0000 mg | ORAL_TABLET | Freq: Three times a day (TID) | ORAL | 0 refills | Status: AC | PRN
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
## 2023-10-08 NOTE — Discharge Instructions (Signed)
You have been given a shot of Toradol 30 mg today.  Ketorolac 10 mg tablets--take 1 tablet every 6 hours as needed for pain.  This is the same medicine that is in the shot we just gave you   Take tizanidine 4 mg--1 every 8 hours as needed for muscle spasms; this medication can cause dizziness and sleepiness
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
## 2024-04-06 ENCOUNTER — Emergency Department (HOSPITAL_COMMUNITY)
Admission: EM | Admit: 2024-04-06 | Discharge: 2024-04-06 | Disposition: A | Discharge status: Home / Self Care | Attending: Emergency Medicine | Admitting: Emergency Medicine

## 2024-04-06 ENCOUNTER — Emergency Department (HOSPITAL_COMMUNITY)

## 2024-04-06 ENCOUNTER — Encounter (HOSPITAL_COMMUNITY): Payer: Self-pay

## 2024-04-06 DIAGNOSIS — Z79899 Other long term (current) drug therapy: Secondary | ICD-10-CM | POA: Insufficient documentation

## 2024-04-06 DIAGNOSIS — Z7984 Long term (current) use of oral hypoglycemic drugs: Secondary | ICD-10-CM | POA: Insufficient documentation

## 2024-04-06 DIAGNOSIS — K6389 Other specified diseases of intestine: Secondary | ICD-10-CM | POA: Diagnosis not present

## 2024-04-06 DIAGNOSIS — R1032 Left lower quadrant pain: Secondary | ICD-10-CM | POA: Diagnosis present

## 2024-04-06 DIAGNOSIS — Z794 Long term (current) use of insulin: Secondary | ICD-10-CM | POA: Diagnosis not present

## 2024-04-06 DIAGNOSIS — I10 Essential (primary) hypertension: Secondary | ICD-10-CM | POA: Insufficient documentation

## 2024-04-06 DIAGNOSIS — E119 Type 2 diabetes mellitus without complications: Secondary | ICD-10-CM | POA: Diagnosis not present

## 2024-04-06 LAB — CBC
HCT: 37.3 % (ref 36.0–46.0)
Hemoglobin: 12.3 g/dL (ref 12.0–15.0)
MCH: 28.4 pg (ref 26.0–34.0)
MCHC: 33 g/dL (ref 30.0–36.0)
MCV: 86.1 fL (ref 80.0–100.0)
Platelets: 213 K/uL (ref 150–400)
RBC: 4.33 MIL/uL (ref 3.87–5.11)
RDW: 12.4 % (ref 11.5–15.5)
WBC: 6.7 K/uL (ref 4.0–10.5)
nRBC: 0 % (ref 0.0–0.2)

## 2024-04-06 LAB — URINALYSIS, ROUTINE W REFLEX MICROSCOPIC
Bilirubin Urine: NEGATIVE
Glucose, UA: NEGATIVE mg/dL
Hgb urine dipstick: NEGATIVE
Ketones, ur: NEGATIVE mg/dL
Nitrite: NEGATIVE
Protein, ur: NEGATIVE mg/dL
Specific Gravity, Urine: 1.012 (ref 1.005–1.030)
pH: 5 (ref 5.0–8.0)

## 2024-04-06 LAB — COMPREHENSIVE METABOLIC PANEL WITH GFR
ALT: 27 U/L (ref 0–44)
AST: 23 U/L (ref 15–41)
Albumin: 4.4 g/dL (ref 3.5–5.0)
Alkaline Phosphatase: 59 U/L (ref 38–126)
Anion gap: 12 (ref 5–15)
BUN: 12 mg/dL (ref 6–20)
CO2: 24 mmol/L (ref 22–32)
Calcium: 9.6 mg/dL (ref 8.9–10.3)
Chloride: 104 mmol/L (ref 98–111)
Creatinine, Ser: 0.43 mg/dL — ABNORMAL LOW (ref 0.44–1.00)
GFR, Estimated: >60 mL/min (ref >60)
Glucose, Bld: 174 mg/dL — ABNORMAL HIGH (ref 70–99)
Potassium: 3.7 mmol/L (ref 3.5–5.1)
Sodium: 140 mmol/L (ref 135–145)
Total Bilirubin: 1.3 mg/dL — ABNORMAL HIGH (ref 0.0–1.2)
Total Protein: 7.8 g/dL (ref 6.5–8.1)

## 2024-04-06 LAB — HCG, SERUM, QUALITATIVE: Preg, Serum: NEGATIVE

## 2024-04-06 LAB — LIPASE, BLOOD: Lipase: 47 U/L (ref 11–51)

## 2024-04-06 MED ORDER — MORPHINE SULFATE (PF) 4 MG/ML IV SOLN
4.0000 mg | Freq: Once | INTRAVENOUS | Status: AC
  Administered 2024-04-06: 4 mg via INTRAVENOUS
  Filled 2024-04-06: qty 1

## 2024-04-06 MED ORDER — ONDANSETRON HCL 4 MG/2ML IJ SOLN
4.0000 mg | Freq: Once | INTRAMUSCULAR | Status: AC
  Administered 2024-04-06: 4 mg via INTRAVENOUS
  Filled 2024-04-06: qty 2

## 2024-04-06 MED ORDER — KETOROLAC TROMETHAMINE 15 MG/ML IJ SOLN
15.0000 mg | Freq: Once | INTRAMUSCULAR | Status: AC
  Administered 2024-04-06: 15 mg via INTRAVENOUS
  Filled 2024-04-06: qty 1

## 2024-04-06 MED ORDER — HYDROCODONE-ACETAMINOPHEN 5-325 MG PO TABS: 1.0000 | ORAL_TABLET | Freq: Four times a day (QID) | ORAL | 0 refills | Status: AC | PRN

## 2024-04-06 MED ORDER — ONDANSETRON HCL 4 MG PO TABS: 4.0000 mg | ORAL_TABLET | Freq: Four times a day (QID) | ORAL | 0 refills | Status: AC

## 2024-04-06 MED ORDER — HYDROCODONE-ACETAMINOPHEN 5-325 MG PO TABS
1.0000 | ORAL_TABLET | Freq: Once | ORAL | Status: AC
  Administered 2024-04-06: 1 via ORAL
  Filled 2024-04-06: qty 1

## 2024-04-06 MED ORDER — SODIUM CHLORIDE 0.9 % IV BOLUS
1000.0000 mL | Freq: Once | INTRAVENOUS | Status: AC
  Administered 2024-04-06: 1000 mL via INTRAVENOUS

## 2024-04-06 NOTE — ED Provider Notes (Cosign Needed Addendum)
 [place] EMERGENCY DEPARTMENT AT [facility] Provider Note   CSN: [id number] Arrival date & time: 04/06/24  1359     Patient presents with: Abdominal Pain  HPI Melissa Washington is a 38 y.o. female with history of nephrolithiasis, diabetes, hypertension, s/p cholecystectomy presenting for abdominal pain.  Started around noon today.  She states it started in the middle of her abdomen but now more localized to the left flank and left mid abdomen.  She states the pain is similar to when she has had a kidney stone in the past.  She denies urinary symptoms at this time.  Endorses some diarrhea and nausea but no vomiting.  Denies abnormal vaginal bleeding or discharge.  Denies fever.    Abdominal Pain      Prior to Admission medications   Medication Sig Start Date End Date Taking? Authorizing Provider  HYDROcodone -acetaminophen  (NORCO/VICODIN) 5-325 MG tablet Take 1 tablet by mouth every 6 (six) hours as needed. 04/06/24  Yes Rion Catala K, PA-C  ondansetron  (ZOFRAN ) 4 MG tablet Take 1 tablet (4 mg total) by mouth every 6 (six) hours. 04/06/24  Yes Lang Norleen POUR, PA-C  blood glucose meter kit and supplies Dispense based on patient and insurance preference. Use up to four times daily as directed. (FOR ICD-9 250.00, 250.01). 04/11/17   Rojelio Nest, DO  Insulin  Glargine (LANTUS ) 100 UNIT/ML Solostar Pen Inject 30 Units into the skin daily at 10 pm. Patient taking differently: Inject 32 Units into the skin daily at 10 pm. 04/11/17 10/08/23  Rojelio Nest, DO  Insulin  Pen Needle 31G X 5 MM MISC Use with insulin , 4 times daily 04/11/17   Rojelio Nest, DO  ketorolac  (TORADOL ) 10 MG tablet Take 1 tablet (10 mg total) by mouth every 6 (six) hours as needed (pain). 10/08/23   Vonna Sharlet POUR, MD  levonorgestrel  (MIRENA ) 20 MCG/24HR IUD 1 each by Intrauterine route continuous.    [provider]  lisinopril  (ZESTRIL ) 10 MG tablet Take 1 tablet by mouth daily. 02/17/20    [provider]  metFORMIN  (GLUCOPHAGE -XR) 500 MG 24 hr tablet Take 500 mg by mouth 2 (two) times daily. 11/10/19   [provider]  NOVOLOG  FLEXPEN 100 UNIT/ML FlexPen Inject 3-20 Units into the skin in the morning, at noon, and at bedtime. 03/06/23   Fondaw, Hamp RAMAN, PA  Semaglutide,0.25 or 0.5MG /DOS, (OZEMPIC, 0.25 OR 0.5 MG/DOSE,) 2 MG/3ML SOPN Inject into the skin. 08/17/23   [provider]  tiZANidine  (ZANAFLEX ) 4 MG tablet Take 1 tablet (4 mg total) by mouth every 8 (eight) hours as needed for muscle spasms. 10/08/23   Vonna Sharlet POUR, MD  ferrous sulfate  325 (65 FE) MG tablet Take 1 tablet (325 mg total) by mouth daily with breakfast. Patient not taking: No sig reported 01/15/20 01/03/21  Regalado, Owen LABOR, MD    Allergies: Ultram  [tramadol  hcl]    Review of Systems  Gastrointestinal:  Positive for abdominal pain.    Updated Vital Signs BP (!) 140/92   Pulse 84   Temp 98.3 F (36.8 C) (Oral)   Resp 17   SpO2 99%   Physical Exam Vitals and nursing note reviewed.  HENT:     Head: Normocephalic and atraumatic.     Mouth/Throat:     Mouth: Mucous membranes are moist.  Eyes:     General:        Right eye: No discharge.        Left eye: No discharge.  Conjunctiva/sclera: Conjunctivae normal.  Cardiovascular:     Rate and Rhythm: Normal rate and regular rhythm.     Pulses: Normal pulses.     Heart sounds: Normal heart sounds.  Pulmonary:     Effort: Pulmonary effort is normal.     Breath sounds: Normal breath sounds.  Abdominal:     General: Abdomen is flat.     Palpations: Abdomen is soft.     Tenderness: There is abdominal tenderness in the left upper quadrant and left lower quadrant. There is left CVA tenderness.  Skin:    General: Skin is warm and dry.  Neurological:     General: No focal deficit present.  Psychiatric:        Mood and Affect: Mood normal.      (all labs ordered are listed, but only abnormal results are  displayed) Labs Reviewed  COMPREHENSIVE METABOLIC PANEL WITH GFR - Abnormal; Notable for the following components:      Result Value   Glucose, Bld 174 (*)    Creatinine, Ser 0.43 (*)    Total Bilirubin 1.3 (*)    All other components within normal limits  URINALYSIS, ROUTINE W REFLEX MICROSCOPIC - Abnormal; Notable for the following components:   APPearance HAZY (*)    Leukocytes,Ua TRACE (*)    Bacteria, UA MANY (*)    All other components within normal limits  LIPASE, BLOOD  CBC  HCG, SERUM, QUALITATIVE    EKG: None  Radiology: CT Renal Stone Study Result Date: 04/06/2024 CLINICAL DATA:  Abdominal pain, stone suspected EXAM: CT ABDOMEN AND PELVIS WITHOUT CONTRAST TECHNIQUE: Multidetector CT imaging of the abdomen and pelvis was performed following the standard protocol without IV contrast. RADIATION DOSE REDUCTION: This exam was performed according to the departmental dose-optimization program which includes automated exposure control, adjustment of the mA and/or kV according to patient size and/or use of iterative reconstruction technique. COMPARISON:  CT Renal Stone protocol April 02, 2020 FINDINGS: Lower chest: No acute abnormality. Hepatobiliary: Hepatic steatosis. Liver measures 24.4 cm in craniocaudal dimension. Cholecystectomy. No intrahepatic bili dilatation. CBD is not dilated. Pancreas: Unremarkable. No pancreatic ductal dilatation or surrounding inflammatory changes. Spleen: Normal in size without focal abnormality. Adrenals/Urinary Tract: Adrenal glands are unremarkable. Kidneys are normal, without renal calculi, focal lesion, or hydronephrosis. Bladder is unremarkable. Stomach/Bowel: Stomach is within normal limits. Appendix is not visualized. No evidence of bowel wall thickening, distention, or inflammatory changes. Vascular/Lymphatic: No significant vascular findings are present. No enlarged abdominal or pelvic lymph nodes. Reproductive: Uterus and bilateral adnexa are  unremarkable.IUD is identified in endometrial cavity. Other: A fat attenuation structure measuring 1.8 cm in right lower quadrant adjacent to a bowel loop and along the right lateral border of the uterus (image 6/86) without significant peripheral fat stranding, may represent a focus of epiploic appendage . Small fat containing umbilical hernia with a defect measuring 9 mm. Musculoskeletal: No acute or significant osseous findings. IMPRESSION: No nephrolithiasis or hydronephrosis. Prominent epiploic appendage in right lower quadrant without significant signs of inflammation, query epiploic appendagitis. Correlate with clinical findings of right lower quadrant pain. Hepatomegaly with hepatic steatosis. Cholecystectomy without biliary dilatation. Electronically Signed   By: Megan  Zare M.D.   On: 04/06/2024 19:03     Procedures   Medications Ordered in the ED  ketorolac  (TORADOL ) 15 MG/ML injection 15 mg (has no administration in time range)  HYDROcodone -acetaminophen  (NORCO/VICODIN) 5-325 MG per tablet 1 tablet (has no administration in time range)  morphine  (PF) 4 MG/ML  injection 4 mg (4 mg Intravenous Given 04/06/24 1701)  sodium chloride  0.9 % bolus 1,000 mL (1,000 mLs Intravenous New Bag/Given 04/06/24 1707)  ondansetron  (ZOFRAN ) injection 4 mg (4 mg Intravenous Given 04/06/24 1701)                                    Medical Decision Making Amount and/or Complexity of Data Reviewed Labs: ordered. Radiology: ordered.  Risk Prescription drug management.   Initial Impression and Ddx 38 year old well-appearing female presenting for abdominal pain.  Exam notable for left flank and left sided abdominal tenderness.  DDx includes kidney stone, pyelonephritis, diverticulitis, appendicitis, UTI, other. Patient PMH that increases complexity of ED encounter:  istory of nephrolithiasis, diabetes, hypertension, s/p cholecystectomy  Interpretation of Diagnostics - I independent reviewed and  interpreted the labs as followed: Bacteria, trace pyuria, negative nitrite  - I independently visualized the following imaging with scope of interpretation limited to determining acute life threatening conditions related to emergency care: CT renal, which revealed epiploic appendagitis but negative for kidney stone or hydronephrosis  Patient Reassessment and Ultimate Disposition/Management Workup suggestive of epiploic appendagitis.  On reassessment still in some pain but had improved in comparison to initial encounter.  Advised NSAIDs and PCP follow-up.  Also sent a few tablets of Norco as well.  Discussed return precautions.  Discharged good condition.  Patient management required discussion with the following services or consulting groups:  None  Complexity of Problems Addressed Acute complicated illness or Injury  Additional Data Reviewed and Analyzed Further history obtained from: Past medical history and medications listed in the EMR and Prior ED visit notes  Patient Encounter Risk Assessment Consideration of hospitalization      Final diagnoses:  Epiploic appendagitis    ED Discharge Orders          Ordered    HYDROcodone -acetaminophen  (NORCO/VICODIN) 5-325 MG tablet  Every 6 hours PRN        04/06/24 2000    ondansetron  (ZOFRAN ) 4 MG tablet  Every 6 hours        04/06/24 2000              Lang Norleen POUR, PA-C 04/06/24 2001    Patsey Lot, MD 04/07/24 [phone]

## 2024-04-06 NOTE — Discharge Instructions (Addendum)
 Evaluation today revealed that you have epiploic appendagitis which is inflammation of fatty tissue around the colon.  Usually self-limiting.  Treatment at home is ibuprofen  and Tylenol .  Also sent a few tablets of Norco to your pharmacy.  Please follow-up your PCP.  If you develop worsening abdominal pain, nausea or vomiting, fever or any other concerning symptom please return to the ED for further evaluation.

## 2024-04-06 NOTE — ED Provider Triage Note (Signed)
 Emergency Medicine Provider Triage Evaluation Note  Graylon LITTIE Hugger , a 38 y.o. female  was evaluated in triage.  Pt complains of left flank pain, nausea.  Review of Systems  Positive: V/D Negative: fever  Physical Exam  BP (!) 130/90 (BP Location: Right Arm)   Pulse 93   Temp 98.5 F (36.9 C) (Oral)   Resp 20   SpO2 99%  Gen:   Awake, no distress   Resp:  Normal effort  MSK:   Moves extremities without difficulty  Other:  TTP LLQ  Medical Decision Making  Medically screening exam initiated at 3:22 PM.  Appropriate orders placed.  Graylon LITTIE Hugger was informed that the remainder of the evaluation will be completed by another provider, this initial triage assessment does not replace that evaluation, and the importance of remaining in the ED until their evaluation is complete.  Likely need CT abd/pelvis after labs, screening labs started.    Shermon Warren SAILOR, [place] 04/06/24 [id number]

## 2024-04-06 NOTE — ED Triage Notes (Signed)
 Pt presents with c/o abdominal pain that started around noon today, generalized in nature at its onset but has moved to her left groin area as well.

## 2024-12-04 ENCOUNTER — Ambulatory Visit: Payer: Self-pay | Admitting: Obstetrics & Gynecology
